# Patient Record
Sex: Male | Born: 1945 | Race: Black or African American | Hispanic: No | Marital: Married | Smoking: Former smoker
Health system: Southern US, Community
[De-identification: ages and names within clinical notes are randomized; demographics above are authoritative.]

## PROBLEM LIST (undated history)

## (undated) DIAGNOSIS — K922 Gastrointestinal hemorrhage, unspecified: Secondary | ICD-10-CM

## (undated) DIAGNOSIS — J449 Chronic obstructive pulmonary disease, unspecified: Secondary | ICD-10-CM

## (undated) DIAGNOSIS — H47019 Ischemic optic neuropathy, unspecified eye: Secondary | ICD-10-CM

## (undated) DIAGNOSIS — N529 Male erectile dysfunction, unspecified: Secondary | ICD-10-CM

## (undated) DIAGNOSIS — H9190 Unspecified hearing loss, unspecified ear: Secondary | ICD-10-CM

## (undated) DIAGNOSIS — I10 Essential (primary) hypertension: Secondary | ICD-10-CM

## (undated) DIAGNOSIS — K5790 Diverticulosis of intestine, part unspecified, without perforation or abscess without bleeding: Secondary | ICD-10-CM

## (undated) DIAGNOSIS — I428 Other cardiomyopathies: Secondary | ICD-10-CM

## (undated) DIAGNOSIS — D126 Benign neoplasm of colon, unspecified: Secondary | ICD-10-CM

## (undated) DIAGNOSIS — N400 Enlarged prostate without lower urinary tract symptoms: Secondary | ICD-10-CM

## (undated) DIAGNOSIS — I82409 Acute embolism and thrombosis of unspecified deep veins of unspecified lower extremity: Secondary | ICD-10-CM

## (undated) DIAGNOSIS — K5792 Diverticulitis of intestine, part unspecified, without perforation or abscess without bleeding: Secondary | ICD-10-CM

## (undated) DIAGNOSIS — G4730 Sleep apnea, unspecified: Secondary | ICD-10-CM

## (undated) DIAGNOSIS — M5136 Other intervertebral disc degeneration, lumbar region: Secondary | ICD-10-CM

## (undated) DIAGNOSIS — I509 Heart failure, unspecified: Secondary | ICD-10-CM

## (undated) DIAGNOSIS — I4891 Unspecified atrial fibrillation: Secondary | ICD-10-CM

## (undated) DIAGNOSIS — E119 Type 2 diabetes mellitus without complications: Secondary | ICD-10-CM

## (undated) DIAGNOSIS — E785 Hyperlipidemia, unspecified: Secondary | ICD-10-CM

## (undated) DIAGNOSIS — M4640 Discitis, unspecified, site unspecified: Secondary | ICD-10-CM

## (undated) DIAGNOSIS — M1990 Unspecified osteoarthritis, unspecified site: Secondary | ICD-10-CM

## (undated) DIAGNOSIS — M51369 Other intervertebral disc degeneration, lumbar region without mention of lumbar back pain or lower extremity pain: Secondary | ICD-10-CM

## (undated) HISTORY — PX: CHOLECYSTECTOMY: SHX55

## (undated) HISTORY — DX: Benign prostatic hyperplasia without lower urinary tract symptoms: N40.0

## (undated) HISTORY — PX: SIGMOIDECTOMY: SHX176

## (undated) HISTORY — DX: Diverticulosis of intestine, part unspecified, without perforation or abscess without bleeding: K57.90

## (undated) HISTORY — PX: COLONOSCOPY: SHX174

## (undated) HISTORY — DX: Essential (primary) hypertension: I10

## (undated) HISTORY — DX: Unspecified atrial fibrillation: I48.91

## (undated) HISTORY — DX: Sleep apnea, unspecified: G47.30

## (undated) HISTORY — PX: TOTAL KNEE ARTHROPLASTY: SHX125

## (undated) HISTORY — PX: ATRIAL ABLATION SURGERY: SHX560

## (undated) HISTORY — DX: Hyperlipidemia, unspecified: E78.5

## (undated) HISTORY — DX: Type 2 diabetes mellitus without complications: E11.9

## (undated) HISTORY — PX: JOINT REPLACEMENT: SHX530

## (undated) HISTORY — DX: Heart failure, unspecified: I50.9

## (undated) SURGERY — Surgical Case
Anesthesia: *Unknown

---

## 2006-03-25 ENCOUNTER — Emergency Department: Payer: Self-pay | Admitting: General Practice

## 2006-03-25 ENCOUNTER — Other Ambulatory Visit: Payer: Self-pay

## 2006-04-04 ENCOUNTER — Ambulatory Visit: Payer: Self-pay

## 2006-04-30 ENCOUNTER — Ambulatory Visit: Payer: Self-pay | Admitting: Pain Medicine

## 2006-05-08 ENCOUNTER — Ambulatory Visit: Payer: Self-pay | Admitting: Pain Medicine

## 2006-06-13 ENCOUNTER — Ambulatory Visit: Payer: Self-pay | Admitting: Pain Medicine

## 2007-01-31 ENCOUNTER — Ambulatory Visit: Payer: Self-pay | Admitting: Gastroenterology

## 2007-01-31 DIAGNOSIS — Z86010 Personal history of colonic polyps: Secondary | ICD-10-CM

## 2007-05-27 ENCOUNTER — Ambulatory Visit: Payer: Self-pay | Admitting: Gastroenterology

## 2008-12-31 HISTORY — PX: ATRIAL ABLATION SURGERY: SHX560

## 2009-02-10 ENCOUNTER — Ambulatory Visit: Payer: Self-pay | Admitting: Gastroenterology

## 2009-12-31 HISTORY — PX: CHOLECYSTECTOMY: SHX55

## 2009-12-31 HISTORY — PX: ERCP: SHX60

## 2010-03-10 ENCOUNTER — Ambulatory Visit: Payer: Self-pay | Admitting: Internal Medicine

## 2010-05-15 ENCOUNTER — Ambulatory Visit: Payer: Self-pay | Admitting: Internal Medicine

## 2010-05-15 ENCOUNTER — Inpatient Hospital Stay: Payer: Self-pay | Admitting: General Surgery

## 2010-06-01 ENCOUNTER — Ambulatory Visit: Payer: Self-pay

## 2010-06-20 ENCOUNTER — Inpatient Hospital Stay: Payer: Self-pay | Admitting: Unknown Physician Specialty

## 2010-06-30 ENCOUNTER — Ambulatory Visit: Payer: Self-pay

## 2010-07-10 ENCOUNTER — Inpatient Hospital Stay: Payer: Self-pay | Admitting: Internal Medicine

## 2010-07-26 ENCOUNTER — Ambulatory Visit: Payer: Self-pay | Admitting: Specialist

## 2010-07-28 ENCOUNTER — Ambulatory Visit: Payer: Self-pay

## 2010-08-31 ENCOUNTER — Ambulatory Visit: Payer: Self-pay

## 2010-09-15 ENCOUNTER — Ambulatory Visit: Payer: Self-pay

## 2010-09-22 ENCOUNTER — Ambulatory Visit: Payer: Self-pay

## 2012-02-20 ENCOUNTER — Other Ambulatory Visit: Payer: Self-pay | Admitting: Pain Medicine

## 2012-02-20 ENCOUNTER — Ambulatory Visit: Payer: Self-pay | Admitting: Pain Medicine

## 2012-02-20 LAB — CBC WITH DIFFERENTIAL/PLATELET
Eosinophil #: 0.1 10*3/uL (ref 0.0–0.7)
HCT: 51 % (ref 40.0–52.0)
HGB: 16.6 g/dL (ref 13.0–18.0)
Lymphocyte #: 2.5 10*3/uL (ref 1.0–3.6)
MCH: 27.9 pg (ref 26.0–34.0)
MCHC: 32.6 g/dL (ref 32.0–36.0)
MCV: 86 fL (ref 80–100)
Monocyte #: 0.9 10*3/uL — ABNORMAL HIGH (ref 0.0–0.7)
Monocyte %: 11.4 %
Neutrophil #: 4.3 10*3/uL (ref 1.4–6.5)
RDW: 14.5 % (ref 11.5–14.5)
WBC: 7.8 10*3/uL (ref 3.8–10.6)

## 2012-02-20 LAB — SEDIMENTATION RATE: Erythrocyte Sed Rate: 1 mm/hr (ref 0–20)

## 2012-03-03 ENCOUNTER — Ambulatory Visit: Payer: Self-pay | Admitting: Pain Medicine

## 2012-03-03 LAB — CREATININE, SERUM: EGFR (Non-African Amer.): >60

## 2012-03-05 ENCOUNTER — Ambulatory Visit: Payer: Self-pay | Admitting: Pain Medicine

## 2012-03-17 ENCOUNTER — Ambulatory Visit: Payer: Self-pay | Admitting: Pain Medicine

## 2012-03-19 ENCOUNTER — Ambulatory Visit: Payer: Self-pay | Admitting: Internal Medicine

## 2012-03-24 ENCOUNTER — Ambulatory Visit: Payer: Self-pay | Admitting: Pain Medicine

## 2012-03-31 ENCOUNTER — Ambulatory Visit: Payer: Self-pay | Admitting: Internal Medicine

## 2012-05-12 ENCOUNTER — Ambulatory Visit: Payer: Self-pay | Admitting: Pain Medicine

## 2012-05-28 ENCOUNTER — Ambulatory Visit: Payer: Self-pay

## 2012-06-04 ENCOUNTER — Ambulatory Visit: Payer: Self-pay | Admitting: Internal Medicine

## 2012-06-30 ENCOUNTER — Ambulatory Visit: Payer: Self-pay | Admitting: Internal Medicine

## 2012-08-19 LAB — CBC WITH DIFFERENTIAL/PLATELET
Basophil #: 0.1 10*3/uL (ref 0.0–0.1)
Basophil %: 1.1 %
Eosinophil #: 0.1 10*3/uL (ref 0.0–0.7)
Eosinophil %: 1.1 %
HGB: 13.4 g/dL (ref 13.0–18.0)
Lymphocyte %: 27.8 %
MCH: 27.4 pg (ref 26.0–34.0)
MCHC: 32.4 g/dL (ref 32.0–36.0)
MCV: 85 fL (ref 80–100)
Monocyte #: 0.8 x10 3/mm (ref 0.2–1.0)
Neutrophil #: 6 10*3/uL (ref 1.4–6.5)
Platelet: 254 10*3/uL (ref 150–440)
RDW: 14.4 % (ref 11.5–14.5)
WBC: 9.7 10*3/uL (ref 3.8–10.6)

## 2012-08-19 LAB — URINALYSIS, COMPLETE
Bacteria: NONE SEEN
Bilirubin,UR: NEGATIVE
Blood: NEGATIVE
Glucose,UR: NEGATIVE mg/dL (ref 0–75)
Leukocyte Esterase: NEGATIVE
Ph: 5 (ref 4.5–8.0)
RBC,UR: 1 /HPF (ref 0–5)
Squamous Epithelial: <1

## 2012-08-19 LAB — COMPREHENSIVE METABOLIC PANEL
Alkaline Phosphatase: 74 U/L (ref 50–136)
Bilirubin,Total: 0.8 mg/dL (ref 0.2–1.0)
Calcium, Total: 9.2 mg/dL (ref 8.5–10.1)
Co2: 30 mmol/L (ref 21–32)
Creatinine: 1.06 mg/dL (ref 0.60–1.30)
EGFR (African American): >60
EGFR (Non-African Amer.): >60
Glucose: 188 mg/dL — ABNORMAL HIGH (ref 65–99)
SGOT(AST): 21 U/L (ref 15–37)
SGPT (ALT): 22 U/L (ref 12–78)
Sodium: 138 mmol/L (ref 136–145)

## 2012-08-19 LAB — PROTIME-INR: INR: 1

## 2012-08-20 ENCOUNTER — Inpatient Hospital Stay: Payer: Self-pay | Admitting: Internal Medicine

## 2012-08-20 LAB — TROPONIN I
Troponin-I: <0.02 ng/mL
Troponin-I: <0.02 ng/mL

## 2012-08-20 LAB — HEMOGLOBIN
HGB: 11.6 g/dL — ABNORMAL LOW (ref 13.0–18.0)
HGB: 10.1 g/dL — ABNORMAL LOW (ref 13.0–18.0)

## 2012-08-20 LAB — TSH: Thyroid Stimulating Horm: 0.561 u[IU]/mL

## 2012-08-21 LAB — CBC WITH DIFFERENTIAL/PLATELET
Basophil #: 0.1 10*3/uL (ref 0.0–0.1)
Basophil #: 0.1 10*3/uL (ref 0.0–0.1)
Basophil #: 0.1 10*3/uL (ref 0.0–0.1)
Basophil #: 0.1 10*3/uL (ref 0.0–0.1)
Eosinophil #: 0.2 10*3/uL (ref 0.0–0.7)
Eosinophil #: 0 10*3/uL (ref 0.0–0.7)
Eosinophil #: 0.1 10*3/uL (ref 0.0–0.7)
Eosinophil %: 2 %
Eosinophil %: 0.1 %
Eosinophil %: 0.1 %
Eosinophil %: 0.5 %
HCT: 23.2 % — ABNORMAL LOW (ref 40.0–52.0)
HGB: 7.4 g/dL — ABNORMAL LOW (ref 13.0–18.0)
HGB: 7.7 g/dL — ABNORMAL LOW (ref 13.0–18.0)
Lymphocyte #: 2.2 10*3/uL (ref 1.0–3.6)
Lymphocyte #: 2.5 10*3/uL (ref 1.0–3.6)
Lymphocyte %: 30.3 %
Lymphocyte %: 21.1 %
Lymphocyte %: 16.1 %
Lymphocyte %: 19.1 %
MCH: 27.7 pg (ref 26.0–34.0)
MCH: 28.8 pg (ref 26.0–34.0)
MCH: 28.3 pg (ref 26.0–34.0)
MCHC: 33.4 g/dL (ref 32.0–36.0)
MCHC: 32.7 g/dL (ref 32.0–36.0)
MCHC: 33.4 g/dL (ref 32.0–36.0)
MCV: 85 fL (ref 80–100)
MCV: 85 fL (ref 80–100)
Monocyte #: 1.1 x10 3/mm — ABNORMAL HIGH (ref 0.2–1.0)
Monocyte #: 1.3 x10 3/mm — ABNORMAL HIGH (ref 0.2–1.0)
Monocyte %: 8.8 %
Neutrophil #: 9.1 10*3/uL — ABNORMAL HIGH (ref 1.4–6.5)
Neutrophil %: 55.8 %
Neutrophil %: 68.4 %
Neutrophil %: 74.6 %
Platelet: 184 10*3/uL (ref 150–440)
Platelet: 181 10*3/uL (ref 150–440)
RBC: 2.93 10*6/uL — ABNORMAL LOW (ref 4.40–5.90)
RBC: 2.74 10*6/uL — ABNORMAL LOW (ref 4.40–5.90)
RDW: 14.4 % (ref 11.5–14.5)
RDW: 14.2 % (ref 11.5–14.5)
RDW: 14.2 % (ref 11.5–14.5)
WBC: 8.9 10*3/uL (ref 3.8–10.6)
WBC: 11.2 10*3/uL — ABNORMAL HIGH (ref 3.8–10.6)
WBC: 13.4 10*3/uL — ABNORMAL HIGH (ref 3.8–10.6)
WBC: 13.1 10*3/uL — ABNORMAL HIGH (ref 3.8–10.6)

## 2012-08-21 LAB — HEMOGLOBIN A1C: Hemoglobin A1C: 6.7 % — ABNORMAL HIGH (ref 4.2–6.3)

## 2012-08-21 LAB — LIPID PANEL
Ldl Cholesterol, Calc: 44 mg/dL (ref 0–100)
Triglycerides: 74 mg/dL (ref 0–200)

## 2012-08-21 LAB — BASIC METABOLIC PANEL
Calcium, Total: 8.2 mg/dL — ABNORMAL LOW (ref 8.5–10.1)
Chloride: 108 mmol/L — ABNORMAL HIGH (ref 98–107)
Co2: 28 mmol/L (ref 21–32)
EGFR (African American): >60
EGFR (Non-African Amer.): >60
Glucose: 142 mg/dL — ABNORMAL HIGH (ref 65–99)
Osmolality: 285 (ref 275–301)
Sodium: 142 mmol/L (ref 136–145)

## 2012-08-21 LAB — PROTIME-INR
INR: 1.1
Prothrombin Time: 14.4 secs (ref 11.5–14.7)

## 2012-08-22 LAB — BASIC METABOLIC PANEL
Anion Gap: 7 (ref 7–16)
Chloride: 108 mmol/L — ABNORMAL HIGH (ref 98–107)
Co2: 27 mmol/L (ref 21–32)
Osmolality: 283 (ref 275–301)
Potassium: 4 mmol/L (ref 3.5–5.1)

## 2012-08-22 LAB — CBC WITH DIFFERENTIAL/PLATELET
Basophil #: 0.1 10*3/uL (ref 0.0–0.1)
Basophil %: 0.5 %
Eosinophil #: 0.2 10*3/uL (ref 0.0–0.7)
Eosinophil %: 1.4 %
HCT: 24.9 % — ABNORMAL LOW (ref 40.0–52.0)
HGB: 8.1 g/dL — ABNORMAL LOW (ref 13.0–18.0)
Lymphocyte #: 2.6 10*3/uL (ref 1.0–3.6)
Lymphocyte %: 19.1 %
MCH: 28 pg (ref 26.0–34.0)
MCHC: 32.7 g/dL (ref 32.0–36.0)
MCV: 86 fL (ref 80–100)
Monocyte #: 1.2 x10 3/mm — ABNORMAL HIGH (ref 0.2–1.0)
Neutrophil #: 9.7 10*3/uL — ABNORMAL HIGH (ref 1.4–6.5)

## 2012-08-22 LAB — HEMOGLOBIN: HGB: 7.3 g/dL — ABNORMAL LOW (ref 13.0–18.0)

## 2012-08-23 LAB — HEMOGLOBIN: HGB: 8.1 g/dL — ABNORMAL LOW (ref 13.0–18.0)

## 2012-08-24 LAB — CBC WITH DIFFERENTIAL/PLATELET
Basophil %: 0.5 %
Eosinophil #: 0.3 10*3/uL (ref 0.0–0.7)
Eosinophil %: 2.5 %
HCT: 22.1 % — ABNORMAL LOW (ref 40.0–52.0)
HGB: 7.6 g/dL — ABNORMAL LOW (ref 13.0–18.0)
Lymphocyte #: 2.4 10*3/uL (ref 1.0–3.6)
MCH: 29.7 pg (ref 26.0–34.0)
MCHC: 34.3 g/dL (ref 32.0–36.0)
MCV: 87 fL (ref 80–100)
Monocyte #: 1.2 x10 3/mm — ABNORMAL HIGH (ref 0.2–1.0)
Monocyte %: 9.8 %
Neutrophil #: 8.1 10*3/uL — ABNORMAL HIGH (ref 1.4–6.5)
Neutrophil %: 67 %
RBC: 2.54 10*6/uL — ABNORMAL LOW (ref 4.40–5.90)

## 2012-08-24 LAB — BASIC METABOLIC PANEL
Calcium, Total: 7.8 mg/dL — ABNORMAL LOW (ref 8.5–10.1)
Chloride: 109 mmol/L — ABNORMAL HIGH (ref 98–107)
EGFR (African American): >60
EGFR (Non-African Amer.): >60
Osmolality: 287 (ref 275–301)
Potassium: 3.7 mmol/L (ref 3.5–5.1)
Sodium: 145 mmol/L (ref 136–145)

## 2012-08-24 LAB — HEMOGLOBIN
HGB: 8 g/dL — ABNORMAL LOW (ref 13.0–18.0)
HGB: 7.8 g/dL — ABNORMAL LOW (ref 13.0–18.0)
HGB: 7.3 g/dL — ABNORMAL LOW (ref 13.0–18.0)

## 2012-08-26 LAB — HEMOGLOBIN
HGB: 7.7 g/dL — ABNORMAL LOW (ref 13.0–18.0)
HGB: 8.3 g/dL — ABNORMAL LOW (ref 13.0–18.0)

## 2012-08-27 LAB — CBC WITH DIFFERENTIAL/PLATELET
Basophil #: 0.1 10*3/uL (ref 0.0–0.1)
Basophil %: 0.6 %
Eosinophil #: 0.3 10*3/uL (ref 0.0–0.7)
HCT: 23.9 % — ABNORMAL LOW (ref 40.0–52.0)
Lymphocyte #: 2.2 10*3/uL (ref 1.0–3.6)
MCHC: 33.3 g/dL (ref 32.0–36.0)
MCV: 88 fL (ref 80–100)
Monocyte #: 1.1 x10 3/mm — ABNORMAL HIGH (ref 0.2–1.0)
Monocyte %: 9.4 %
Neutrophil #: 8.3 10*3/uL — ABNORMAL HIGH (ref 1.4–6.5)
Platelet: 324 10*3/uL (ref 150–440)
RDW: 14.8 % — ABNORMAL HIGH (ref 11.5–14.5)
WBC: 12 10*3/uL — ABNORMAL HIGH (ref 3.8–10.6)

## 2012-08-28 LAB — HEMOGLOBIN: HGB: 7.7 g/dL — ABNORMAL LOW (ref 13.0–18.0)

## 2012-12-31 HISTORY — PX: ESOPHAGOGASTRODUODENOSCOPY: SHX1529

## 2013-03-23 ENCOUNTER — Ambulatory Visit: Payer: Self-pay | Admitting: Internal Medicine

## 2013-03-31 ENCOUNTER — Ambulatory Visit: Payer: Self-pay | Admitting: Internal Medicine

## 2013-03-31 LAB — CBC CANCER CENTER
Bands: 3 %
Basophil %: 1.4 %
Eosinophil #: 0.2 x10 3/mm (ref 0.0–0.7)
Eosinophil %: 2.2 %
HCT: 44.5 % (ref 40.0–52.0)
Lymphocyte #: 2.4 x10 3/mm (ref 1.0–3.6)
Lymphocyte %: 31.3 %
Lymphocytes: 36 %
Monocyte #: 0.9 x10 3/mm (ref 0.2–1.0)
Monocyte %: 11.5 %
Neutrophil #: 4.2 x10 3/mm (ref 1.4–6.5)
Neutrophil %: 53.6 %
RBC: 6.41 10*6/uL — ABNORMAL HIGH (ref 4.40–5.90)
RDW: 19.8 % — ABNORMAL HIGH (ref 11.5–14.5)
Variant Lymphocyte: 2 %
WBC: 7.8 x10 3/mm (ref 3.8–10.6)

## 2013-03-31 LAB — IRON AND TIBC
Iron Saturation: 7 %
Unbound Iron-Bind.Cap.: 530 ug/dL

## 2013-04-01 LAB — PROT IMMUNOELECTROPHORES(ARMC)

## 2013-04-30 ENCOUNTER — Ambulatory Visit: Payer: Self-pay | Admitting: Internal Medicine

## 2013-05-31 ENCOUNTER — Ambulatory Visit: Payer: Self-pay | Admitting: Internal Medicine

## 2013-06-26 LAB — CBC CANCER CENTER
Basophil %: 1 %
Eosinophil #: 0.1 x10 3/mm (ref 0.0–0.7)
Eosinophil %: 1.2 %
HGB: 15.6 g/dL (ref 13.0–18.0)
Lymphocyte #: 3.7 x10 3/mm — ABNORMAL HIGH (ref 1.0–3.6)
Lymphocyte %: 35.7 %
MCH: 25.2 pg — ABNORMAL LOW (ref 26.0–34.0)
MCHC: 32 g/dL (ref 32.0–36.0)
MCV: 79 fL — ABNORMAL LOW (ref 80–100)
Monocyte %: 9.6 %
Neutrophil #: 5.5 x10 3/mm (ref 1.4–6.5)
Neutrophil %: 52.5 %
RBC: 6.19 10*6/uL — ABNORMAL HIGH (ref 4.40–5.90)
RDW: 21.1 % — ABNORMAL HIGH (ref 11.5–14.5)
WBC: 10.5 x10 3/mm (ref 3.8–10.6)

## 2013-06-26 LAB — IRON AND TIBC
Iron Saturation: 21 %
Iron: 88 ug/dL (ref 65–175)
Unbound Iron-Bind.Cap.: 339 ug/dL

## 2013-06-30 ENCOUNTER — Ambulatory Visit: Payer: Self-pay | Admitting: Internal Medicine

## 2013-10-03 ENCOUNTER — Inpatient Hospital Stay: Payer: Self-pay | Admitting: Internal Medicine

## 2013-10-03 LAB — CBC
HCT: 48.7 % (ref 40.0–52.0)
Platelet: 274 10*3/uL (ref 150–440)

## 2013-10-03 LAB — URINALYSIS, COMPLETE
Bacteria: NONE SEEN
Bilirubin,UR: NEGATIVE
Blood: NEGATIVE
Ph: 5 (ref 4.5–8.0)
Specific Gravity: 1.027 (ref 1.003–1.030)
WBC UR: 14 /HPF (ref 0–5)

## 2013-10-03 LAB — COMPREHENSIVE METABOLIC PANEL
Albumin: 3.5 g/dL (ref 3.4–5.0)
Alkaline Phosphatase: 91 U/L (ref 50–136)
Co2: 26 mmol/L (ref 21–32)
Creatinine: 0.87 mg/dL (ref 0.60–1.30)
Glucose: 157 mg/dL — ABNORMAL HIGH (ref 65–99)
Potassium: 3.8 mmol/L (ref 3.5–5.1)
SGOT(AST): 17 U/L (ref 15–37)
SGPT (ALT): 22 U/L (ref 12–78)
Total Protein: 7.1 g/dL (ref 6.4–8.2)

## 2013-10-03 LAB — HEMOGLOBIN: HGB: 13.3 g/dL (ref 13.0–18.0)

## 2013-10-03 LAB — PROTIME-INR
INR: 1
Prothrombin Time: 13.4 secs (ref 11.5–14.7)

## 2013-10-04 LAB — COMPREHENSIVE METABOLIC PANEL
Albumin: 3 g/dL — ABNORMAL LOW (ref 3.4–5.0)
Alkaline Phosphatase: 92 U/L (ref 50–136)
Anion Gap: 7 (ref 7–16)
BUN: 8 mg/dL (ref 7–18)
Co2: 27 mmol/L (ref 21–32)
EGFR (Non-African Amer.): >60
Glucose: 136 mg/dL — ABNORMAL HIGH (ref 65–99)
Osmolality: 278 (ref 275–301)
Potassium: 3.7 mmol/L (ref 3.5–5.1)
SGOT(AST): 8 U/L — ABNORMAL LOW (ref 15–37)
SGPT (ALT): 17 U/L (ref 12–78)
Sodium: 139 mmol/L (ref 136–145)

## 2013-10-04 LAB — CBC WITH DIFFERENTIAL/PLATELET
Basophil #: 0 10*3/uL (ref 0.0–0.1)
Basophil %: 0.6 %
Eosinophil #: 0.1 10*3/uL (ref 0.0–0.7)
MCH: 27.8 pg (ref 26.0–34.0)
MCHC: 33.5 g/dL (ref 32.0–36.0)
MCV: 83 fL (ref 80–100)
Monocyte #: 0.7 x10 3/mm (ref 0.2–1.0)
Monocyte %: 9.6 %
Neutrophil #: 4.9 10*3/uL (ref 1.4–6.5)

## 2013-10-04 LAB — HEMOGLOBIN: HGB: 15.1 g/dL (ref 13.0–18.0)

## 2014-01-26 ENCOUNTER — Ambulatory Visit: Payer: Self-pay | Admitting: Specialist

## 2014-03-10 ENCOUNTER — Ambulatory Visit: Payer: Self-pay | Admitting: General Practice

## 2014-03-10 LAB — CBC
HCT: 48.1 % (ref 40.0–52.0)
HGB: 15.7 g/dL (ref 13.0–18.0)
MCH: 26.5 pg (ref 26.0–34.0)
MCHC: 32.6 g/dL (ref 32.0–36.0)
MCV: 81 fL (ref 80–100)
Platelet: 268 10*3/uL (ref 150–440)
RBC: 5.93 10*6/uL — ABNORMAL HIGH (ref 4.40–5.90)
RDW: 15.8 % — ABNORMAL HIGH (ref 11.5–14.5)
WBC: 7.8 10*3/uL (ref 3.8–10.6)

## 2014-03-10 LAB — URINALYSIS, COMPLETE
BILIRUBIN, UR: NEGATIVE
Bacteria: NONE SEEN
Blood: NEGATIVE
Glucose,UR: NEGATIVE mg/dL (ref 0–75)
Ketone: NEGATIVE
Nitrite: NEGATIVE
PROTEIN: NEGATIVE
Ph: 6 (ref 4.5–8.0)
RBC,UR: <1 /HPF (ref 0–5)
Specific Gravity: 1.008 (ref 1.003–1.030)
Squamous Epithelial: NONE SEEN
WBC UR: 5 /HPF (ref 0–5)

## 2014-03-10 LAB — SEDIMENTATION RATE: Erythrocyte Sed Rate: 1 mm/hr (ref 0–20)

## 2014-03-10 LAB — PROTIME-INR
INR: 1.1
PROTHROMBIN TIME: 14 s (ref 11.5–14.7)

## 2014-03-10 LAB — APTT: Activated PTT: 32.2 secs (ref 23.6–35.9)

## 2014-03-10 LAB — BASIC METABOLIC PANEL
Anion Gap: 1 — ABNORMAL LOW (ref 7–16)
BUN: 9 mg/dL (ref 7–18)
CREATININE: 0.91 mg/dL (ref 0.60–1.30)
Calcium, Total: 9.8 mg/dL (ref 8.5–10.1)
Chloride: 99 mmol/L (ref 98–107)
Co2: 32 mmol/L (ref 21–32)
GLUCOSE: 148 mg/dL — AB (ref 65–99)
OSMOLALITY: 266 (ref 275–301)
POTASSIUM: 4 mmol/L (ref 3.5–5.1)
Sodium: 132 mmol/L — ABNORMAL LOW (ref 136–145)

## 2014-03-10 LAB — MRSA PCR SCREENING

## 2014-03-11 LAB — URINE CULTURE

## 2014-03-24 ENCOUNTER — Inpatient Hospital Stay: Payer: Self-pay | Admitting: General Practice

## 2014-03-24 HISTORY — PX: TOTAL KNEE ARTHROPLASTY: SHX125

## 2014-03-25 LAB — BASIC METABOLIC PANEL
Anion Gap: 2 — ABNORMAL LOW (ref 7–16)
BUN: 8 mg/dL (ref 7–18)
CALCIUM: 8.8 mg/dL (ref 8.5–10.1)
CO2: 31 mmol/L (ref 21–32)
Chloride: 104 mmol/L (ref 98–107)
Creatinine: 0.83 mg/dL (ref 0.60–1.30)
GLUCOSE: 109 mg/dL — AB (ref 65–99)
OSMOLALITY: 273 (ref 275–301)
Potassium: 3.7 mmol/L (ref 3.5–5.1)
SODIUM: 137 mmol/L (ref 136–145)

## 2014-03-25 LAB — PLATELET COUNT: Platelet: 221 10*3/uL (ref 150–440)

## 2014-03-25 LAB — HEMOGLOBIN: HGB: 14.2 g/dL (ref 13.0–18.0)

## 2014-03-26 LAB — BASIC METABOLIC PANEL
Anion Gap: 0 — ABNORMAL LOW (ref 7–16)
BUN: 8 mg/dL (ref 7–18)
CHLORIDE: 102 mmol/L (ref 98–107)
Calcium, Total: 9.2 mg/dL (ref 8.5–10.1)
Co2: 34 mmol/L — ABNORMAL HIGH (ref 21–32)
Creatinine: 0.9 mg/dL (ref 0.60–1.30)
EGFR (African American): >60
EGFR (Non-African Amer.): >60
Glucose: 114 mg/dL — ABNORMAL HIGH (ref 65–99)
Osmolality: 271 (ref 275–301)
Potassium: 4 mmol/L (ref 3.5–5.1)
Sodium: 136 mmol/L (ref 136–145)

## 2014-03-26 LAB — HEMOGLOBIN: HGB: 14.4 g/dL (ref 13.0–18.0)

## 2014-03-26 LAB — PLATELET COUNT: PLATELETS: 212 10*3/uL (ref 150–440)

## 2014-05-06 DIAGNOSIS — Z96651 Presence of right artificial knee joint: Secondary | ICD-10-CM | POA: Insufficient documentation

## 2014-05-06 DIAGNOSIS — M1712 Unilateral primary osteoarthritis, left knee: Secondary | ICD-10-CM | POA: Insufficient documentation

## 2014-06-22 DIAGNOSIS — M1710 Unilateral primary osteoarthritis, unspecified knee: Secondary | ICD-10-CM | POA: Insufficient documentation

## 2014-11-07 DIAGNOSIS — E7800 Pure hypercholesterolemia, unspecified: Secondary | ICD-10-CM | POA: Insufficient documentation

## 2014-11-07 DIAGNOSIS — E1129 Type 2 diabetes mellitus with other diabetic kidney complication: Secondary | ICD-10-CM | POA: Insufficient documentation

## 2014-11-07 DIAGNOSIS — R809 Proteinuria, unspecified: Secondary | ICD-10-CM

## 2014-11-07 DIAGNOSIS — H9190 Unspecified hearing loss, unspecified ear: Secondary | ICD-10-CM | POA: Insufficient documentation

## 2014-11-07 DIAGNOSIS — M5136 Other intervertebral disc degeneration, lumbar region: Secondary | ICD-10-CM | POA: Insufficient documentation

## 2014-11-07 DIAGNOSIS — E1121 Type 2 diabetes mellitus with diabetic nephropathy: Secondary | ICD-10-CM | POA: Insufficient documentation

## 2014-11-07 DIAGNOSIS — I10 Essential (primary) hypertension: Secondary | ICD-10-CM | POA: Insufficient documentation

## 2015-04-19 NOTE — Consult Note (Signed)
PATIENT NAME:  Evan [name], Evan [name] MR#:  [id number] DATE OF BIRTH:  July 04, 1945  DATE OF CONSULTATION:  08/26/2012  REFERRING PHYSICIAN:   CONSULTING PHYSICIAN:  Loreli Dollar, MD  HISTORY OF PRESENT ILLNESS: This 70 year old male was admitted with a chief complaint of rectal bleeding. He has been in the hospital for a week. He describes fairly severe bleeding. He had no associated abdominal pain. He had no nausea or vomiting. No chills or fever. Did have some significant weakness and dizziness.   He was admitted emergently. Patient says he thinks he has had five blood transfusions. He did have a GI bleeding study which demonstrated bleeding in the right lower quadrant. Also has had mesenteric catheterization and embolization of the ileocolic vessels. Also has had colonoscopy with findings of extensive diverticulosis of the right colon. Patient reports he subsequently went a number of days without a bowel movement. His hemoglobin is being monitored and appeared that he was stable, however, today he had formed bowel movement and there was some blood identified which some of it was somewhat bright red and most of it was more of a dark maroon color. Patient reports no new symptoms today other than the bowel movement. He reports he is voiding satisfactorily. He is not having any difficulty breathing. He has been up walking, not having any fever or chills.   PAST MEDICAL HISTORY: His past medical history has generally been good.  1. History of hypertension.  2. Hyperlipidemia.  3. History of atrial flutter and has had ablation 2010. 4. Diet controlled diabetes  5. History of benign prostatic hypertrophy.  PAST SURGICAL HISTORY: I did a laparoscopic cholecystectomy in 2011.   SOCIAL HISTORY: He is accompanied by his wife. Has not smoked in some 18 years. Does not drink any alcohol except for just a very rare time.    FAMILY HISTORY: Positive for diabetes, heart disease.   MEDICATIONS:  1. Aspirin 81  mg. 2. Tamsulosin 0.4 mg daily.  3. Pravastatin 40 mg daily.  4. Amlodipine/benazepril 10/20 daily.  5. Fluticasone nasal spray daily. 6. Eyedrops for glaucoma.  7. Vitamins.  REVIEW OF SYSTEMS: He reports that before this he had been moving his bowels satisfactorily with no problems with constipation or previous bleeding or diarrhea. He reports he eats oatmeal frequently. Does have some fiber in his diet. He reports no recent acute illness such as cough, cold, sore throat. No recent visual or auditory changes. No dyspnea on exertion. No chest pains. No recent rash or boil. Has been voiding satisfactorily. Review of systems otherwise negative.   PHYSICAL EXAMINATION:  GENERAL: He is awake, alert, oriented, ambulatory, no acute distress.   VITAL SIGNS: Blood pressure 151/83, pulse 87, respirations 20, temperature 98.1, oxygen saturation 97%.   SKIN: Warm and dry without rash.   HEENT: Sclerae clear. Pharynx clear.   NECK: No palpable mass.   LUNGS: Lungs sounds are clear.   HEART: Regular rhythm, S1 and S2.   ABDOMEN: Fairly obese and soft, nontender with no palpable mass. No hepatomegaly.   RECTAL: Rectal exam demonstrated tiny bit of blood stain on the anoderm. Digital exam demonstrates good sphincter tone. There was some very dark maroon-colored stool found on the examining finger but there was no palpable anorectal mass.   EXTREMITIES: No dependent edema.   NEUROLOGIC: Awake, alert, oriented, moving all extremities.   LABORATORY, DIAGNOSTIC AND RADIOLOGICAL DATA: Creatinine is normal at 0.71. Latest hemoglobin 8.3. Latest platelet count 188,000. Pro time 14.4 seconds.  IMPRESSION AND RECOMMENDATIONS: Diverticulosis of the right colon with recent colonic bleeding and posthemorrhagic anemia.   I discussed this with him in detail. I do not think he needs any immediate surgery but I think we can still give some time for observation. I have explained that once all the bleeding  stops it may take about a week or more for all of the blood to exit the colon.  I have discussed the potential role for surgery which could be laparoscopic right colectomy although surgery not recommended at present.   I did recommend avoid aspirin for a period of several months and at least until his anemia has resolved and could consider avoiding aspirin for a longer period of time.   If he has no further substantial bleeding possibly could go home from the hospital within the next couple days but I would suggest seeing the next couple of bowel movements and determine if he is satisfactory for discharge.  ____________________________ J. Rochel Brome, MD jws:cms D: 08/26/2012 18:43:19 ET T: 08/27/2012 10:02:47 ET JOB#: [id number]  cc: Loreli Dollar, MD, <Dictator>  Loreli Dollar MD ELECTRONICALLY SIGNED 08/27/2012 18:30

## 2015-04-19 NOTE — Op Note (Signed)
PATIENT NAME:  Evan [name], CREDIT MR#:  [id number] DATE OF BIRTH:  01/13/1945  DATE OF PROCEDURE:  08/23/2012  INDICATION: Persistent rectal bleeding.  POSTPROCEDURE DIAGNOSES: 1. Significant diverticulosis involving mainly the cecum and ascending colon.  2. No active bleeding.  3. No colon polyps.   MEDICATIONS: 6mg  Versed IV 120ug Fentanyl IV  DESCRIPTION: Patient is a 70 year old black gentleman who presents with acute lower GI bleeding. Bleeding scan showed active bleeding in this cecal area. Patient underwent embolization on 08/22. Unfortunately, patient kept on bleeding yesterday with drop in hemoglobin requiring blood transfusions. Therefore, patient was prepped last night to have a colonoscopy this morning.   Fortunately by the time the prep was finishing up the bleeding stopped by this morning. Hemoglobin did drop to 6.6. Patient is to receive another unit of blood. Patient was given Versed and fentanyl for sedation. Patient received a total of 6 mg of Versed and 125 mcg of fentanyl for sedation. The colonoscope was inserted into the rectum under direct visualization. There was no blood anywhere. It was gradually advanced to the cecum without difficulty. Patient had multiple diverticula involving the cecum and ascending colon. There are no adherent clots anywhere. Therefore, it was impossible to tell which of the diverticula bled. Fortunately there is no bleeding. I made multiple times to intubate the terminal ileum without success. The scope was gradually removed as I visualized the mucosa. Patient also has a few small diverticula in the sigmoid colon. Otherwise the rest of the colon was completely normal. There were no polyps anywhere. Patient really did not have much hemorrhoids either. The scope was removed. Patient tolerated the procedure well.   IMPRESSION: Patient with diverticulosis mainly involving the cecum and ascending colon. I suspect it was one of those diverticulum that bled  actively. At this time there is no active bleeding.   PLAN: To monitor the hemoglobin and transfuse as needed. Will gradually advance the diet from full liquids to solid good later. If there is no further bleeding, then patient can be discharged to home in the next day or two. If patient has recurrent bleeding requiring blood transfusions again then will need to contact general surgery for surgical evaluation to potentially remove the right part of the colon.   ____________________________ Lupita Dawn. Candace Cruise, MD pyo:cms D: 08/23/2012 08:49:25 ET T: 08/23/2012 09:31:29 ET JOB#: [id number]  cc: Lupita Dawn. Candace Cruise, MD, <Dictator>  Lupita Dawn Pheobe Sandiford MD ELECTRONICALLY SIGNED 08/24/2012 7:45

## 2015-04-19 NOTE — Discharge Summary (Signed)
PATIENT NAME:  Evan [name], Evan [name] MR#:  [id number] DATE OF BIRTH:  10-Jan-1945  DATE OF ADMISSION:  08/20/2012 DATE OF DISCHARGE:  08/28/2012  This is a final discharge summary for this patient. Please refer to interim discharge summary dictated by Dr. Leslye Peer on 08/27/2012. This is just an addendum to that interim summary as patient is being discharged on 08/28/2012.   HOSPITAL COURSE: Patient's hemoglobin remained stable, was 7.7. Did not have any further bleeding and was evaluated by Dr. Rochel Brome from surgery and Dr. Candace Cruise from GI on the 08/28 with clear recommendation for possible discharge today as he remained hemodynamically stable.   PHYSICAL EXAMINATION: VITAL SIGNS: On the date of discharge his vital signs are as follows: Temperature 98, heart rate 93 per minute, respirations 18 per minute, blood pressure 154/85 mmHg. He is saturating 97% on room air. Pertinent Physical Examination on the date of discharge: CARDIOVASCULAR: S1, S2 normal. No murmurs, rubs, or gallops. LUNGS: Clear to auscultation bilaterally. No wheezing, rales, rhonchi, crepitation. ABDOMEN: Soft, benign. NEUROLOGIC: Nonfocal examination. All other physical examination remained at the baseline.   DISCHARGE MEDICATIONS: As dictated by Dr. Leslye Peer in the interim discharge summary.   DISCHARGE INSTRUCTIONS AND FOLLOW UP: Patient will need follow up with his primary care physician, Dr. Raechel Ache, in one week. He will also need follow up with Dr. Candace Cruise in 2 to 4 weeks and Dr. Rochel Brome in 4 to 6 weeks. He was requested to get CBC checked on 09/03 with results forwarded to primary care physician for hemoglobin monitoring. He was also instructed to call his primary care physician and/or GI and/or surgeon if he notices any bright red blood in excess of normal amount and not stopping or if he gets symptomatic in the form of dizziness and/or unusual fatigue.          TOTAL TIME DISCHARGING THIS PATIENT: 45 minutes.    ____________________________ Lucina Mellow. Manuella Ghazi, MD vss:cms D: 08/28/2012 17:03:06 ET T: 08/29/2012 12:00:57 ET JOB#: [id number]  cc: Aydrian Halpin S. Manuella Ghazi, MD, <Dictator> Christena Flake. Raechel Ache, MD Lupita Dawn. Candace Cruise, MD J. Rochel Brome, MD Katha Cabal, MD  Lucina Mellow [facility] MD ELECTRONICALLY SIGNED 08/29/2012 22:04

## 2015-04-19 NOTE — Consult Note (Signed)
Pt seen and examined. See C.London's notes. Pt had brown stool this AM. Some drop in hgb overnight. No abd pain. Just passed some blood per rectum. Known hx of tics and polyps. Likely has diverticular bleeding. Moniter hgb. If more bleeding, then order bleeding scan. Keep on liquid diet. Do not want to prep him tonight to make bleeding worse. If bleeding slows down, then prep tomorrow afternoon for colonoscopy Fri AM. Will follow. Thanks.  Electronic Signatures: Verdie Shire (MD)  (Signed on 21-Aug-13 16:37)  Authored  Last Updated: 21-Aug-13 16:37 by Verdie Shire (MD)

## 2015-04-19 NOTE — Consult Note (Signed)
Chief Complaint:   Subjective/Chief Complaint Had embolization yest afternoon, but patient still bleeding. Large amount this AM. Repeat hgb show drop in hgb. No abd pain   VITAL SIGNS/ANCILLARY NOTES: **Vital Signs.:   23-Aug-13 12:00   Pulse Pulse 108   Respirations Respirations 24   Systolic BP Systolic BP 696   Diastolic BP (mmHg) Diastolic BP (mmHg) 55   Mean BP 76   Pulse Ox % Pulse Ox % 98   Pulse Ox Activity Level  At rest   Oxygen Delivery Room Air/ 21 %   Pulse Ox Heart Rate 54   Brief Assessment:   Cardiac Regular    Respiratory clear BS    Gastrointestinal Normal   Lab Results: Routine Chem:  23-Aug-13 06:30    Glucose, Serum  129   BUN 8   Creatinine (comp) 0.68   Sodium, Serum 142   Potassium, Serum 4.0   Chloride, Serum  108   CO2, Serum 27   Calcium (Total), Serum  7.7   Anion Gap 7   Osmolality (calc) 283   eGFR (African American) >60   eGFR (Non-African American) >60 (eGFR values <20m/min/1.73 m2 may be an indication of chronic kidney disease (CKD). Calculated eGFR is useful in patients with stable renal function. The eGFR calculation will not be reliable in acutely ill patients when serum creatinine is changing rapidly. It is not useful in  patients on dialysis. The eGFR calculation may not be applicable to patients at the low and high extremes of body sizes, pregnant women, and vegetarians.)  Routine Hem:  23-Aug-13 06:30    Hemoglobin (CBC)  8.1   WBC (CBC)  13.8   RBC (CBC)  2.91   Hematocrit (CBC)  24.9   Platelet Count (CBC) 177   MCV 86   MCH 28.0   MCHC 32.7   RDW 14.2   Neutrophil % 70.1   Lymphocyte % 19.1   Monocyte % 8.9   Eosinophil % 1.4   Basophil % 0.5   Neutrophil #  9.7   Lymphocyte # 2.6   Monocyte #  1.2   Eosinophil # 0.2   Basophil # 0.1 (Result(s) reported on 22 Aug 2012 at 07:06AM.)   Assessment/Plan:  Assessment/Plan:   Assessment LGI bleeding from cecal area. S/P embolization.    Plan Will prep  patient today for colonoscopy tomorrow AM. Will transfuse another unit of blood. Thanks.   Electronic Signatures: OVerdie Shire(MD)  (Signed 23-Aug-13 12:33)  Authored: Chief Complaint, VITAL SIGNS/ANCILLARY NOTES, Brief Assessment, Lab Results, Assessment/Plan   Last Updated: 23-Aug-13 12:33 by OVerdie Shire(MD)

## 2015-04-19 NOTE — Consult Note (Signed)
Chief Complaint:   Subjective/Chief Complaint 3 bloody stools this AM. Bleeding scan positive near cecal region.   VITAL SIGNS/ANCILLARY NOTES: **Vital Signs.:   22-Aug-13 12:42   Vital Signs Type Routine   Temperature Temperature (F) 98.5   Celsius 36.9   Temperature Source oral   Respirations Respirations 17   Systolic BP Systolic BP 295   Diastolic BP (mmHg) Diastolic BP (mmHg) 54   Pulse Lying Pulse Lying 621   Systolic BP Systolic BP 308   Diastolic BP (mmHg) Diastolic BP (mmHg) 55   Pulse Pulse Sitting 119   Pulse Ox % Pulse Ox % 100   Pulse Ox Activity Level  With exertion   Oxygen Delivery Room Air/ 21 %   Brief Assessment:   Cardiac Regular    Respiratory clear BS    Gastrointestinal Normal   Lab Results: Routine Chem:  22-Aug-13 05:02    Glucose, Serum  142   BUN 11   Creatinine (comp) 0.88   Sodium, Serum 142   Potassium, Serum 4.1   Chloride, Serum  108   CO2, Serum 28   Calcium (Total), Serum  8.2   Anion Gap  6   Osmolality (calc) 285   eGFR (African American) >60   eGFR (Non-African American) >60 (eGFR values <83m/min/1.73 m2 may be an indication of chronic kidney disease (CKD). Calculated eGFR is useful in patients with stable renal function. The eGFR calculation will not be reliable in acutely ill patients when serum creatinine is changing rapidly. It is not useful in  patients on dialysis. The eGFR calculation may not be applicable to patients at the low and high extremes of body sizes, pregnant women, and vegetarians.)   Hemoglobin A1c ([facility])  6.7 (The American Diabetes Association recommends that a primary goal of therapy should be <7% and that physicians should reevaluate the treatment regimen in patients with HbA1c values consistently >8%.)   Cholesterol, Serum 85   Triglycerides, Serum 74   HDL (INHOUSE)  26   VLDL Cholesterol Calculated 15   LDL Cholesterol Calculated 44 (Result(s) reported on 21 Aug 2012 at 05:37AM.)  Routine Coag:   22-Aug-13 05:02    Prothrombin 14.4   INR 1.1 (INR reference interval applies to patients on anticoagulant therapy. A single INR therapeutic range for coumarins is not optimal for all indications; however, the suggested range for most indications is 2.0 - 3.0. Exceptions to the INR Reference Range may include: Prosthetic heart valves, acute myocardial infarction, prevention of myocardial infarction, and combinations of aspirin and anticoagulant. The need for a higher or lower target INR must be assessed individually. Reference: The Pharmacology and Management of the Vitamin K  antagonists: the seventh ACCP Conference on Antithrombotic and Thrombolytic Therapy. CMVHQI.6962Sept:126 (3suppl): [id number] A HCT value >55% may artifactually increase the PT.  In one study,  the increase was an average of 25%. Reference:  "Effect on Routine and Special Coagulation Testing Values of Citrate Anticoagulant Adjustment in Patients with High HCT Values." American Journal of Clinical Pathology 2006;126:400-405.)  Routine Hem:  22-Aug-13 05:02    WBC (CBC) 8.9   RBC (CBC)  3.37   Hemoglobin (CBC)  9.5   Hematocrit (CBC)  28.4   Platelet Count (CBC) 208   MCV 84   MCH 28.2   MCHC 33.4   RDW 14.4   Neutrophil % 55.8   Lymphocyte % 30.3   Monocyte % 11.2   Eosinophil % 2.0   Basophil % 0.7   Neutrophil #  5.0   Lymphocyte # 2.7   Monocyte # 1.0   Eosinophil # 0.2   Basophil # 0.1 (Result(s) reported on 21 Aug 2012 at 05:43AM.)   Radiology Results: Nuclear Med:    22-Aug-13 11:38, GI Blood Loss Study - Nuc Med   GI Blood Loss Study - Nuc Med    REASON FOR EXAM:    BRBPR  COMMENTS:       PROCEDURE: NM  - NM GI BLOOD LOSS STUDY  - Aug 21 2012 11:38AM     RESULT: Comparison: None.    Radiopharmaceutical: 23.855 mCi Tc-48m    Technique: Standard departmental tagged red cell scan was performed.  Dynamic, anterior planar images of the abdomen were obtained over 60   minutes. Then  additional anterior planar images were obtained for   approximately 30 minutes.    Findings:   There is normal distribution of radiotracer material. At approximately 60   minutes, an area of abnormally radiotracer accumulation is seen within   the region of the right lower quadrant. The scan was then continued for   an additional approximate 30 minutes. This area increased in degree of   activity. However, it it did not peristalse during this examination. The   study was terminated as the patient's nurse stated the patient needed to   return to the CCU.    IMPRESSION:   Abnormal accumulation of radiotracer activity in the right lower quadrant   is concerning for hemorrhage. This is of uncertain location as it did not   peristalse during the examination. This may represent active blood   extravasation into the cecum or proximal ascending colon.    This was called to Dr. SHillary Bowat 1150 hours 08/21/2012.  Dictation site: 2          Verified By: RGregor Hams M.D., MD   Assessment/Plan:  Assessment/Plan:   Assessment LGI bleeding from cecal area. Cecum looked normal on previous colon. 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 Either diverticular bleeding or bleeding from AVM's?    Plan Vascular surgery consult for poss angiography. If angiography negative, then will prep patient for colonoscopy tomorrow AM. Thanks.   Electronic Signatures: OVerdie Shire(MD)  (Signed [id number])  Authored: Chief Complaint, VITAL SIGNS/ANCILLARY NOTES, Brief Assessment, Lab Results, Radiology Results, Assessment/Plan   Last Updated: 22-Aug-13 13:07 by OVerdie Shire(MD)

## 2015-04-19 NOTE — Op Note (Signed)
PATIENT NAME:  Evan [name], BROD MR#:  [id number] DATE OF BIRTH:  11/17/1945  DATE OF PROCEDURE:  08/21/2012  PREOPERATIVE DIAGNOSES:  1. Severe lower gastrointestinal bleeding with need for blood resuscitation.  2. Morbid obesity.  3. Positive bleeding scan for the right lower quadrant.   POSTOPERATIVE DIAGNOSES:  1. Severe lower gastrointestinal bleeding with need for blood resuscitation.  2. Morbid obesity.  3. Positive bleeding scan for the right lower quadrant.   PROCEDURE PERFORMED:   1. Catheter placement into ileocolic branch of  superior mesenteric artery.  2. Aortogram and selective SMA arteriogram.  3. Microbead embolization of ileocolic and right colic branches with 384 - 700 micron polyvinyl alcohol beads.  4. StarClose closure device,  right femoral artery.   SURGEON: Algernon Huxley, MD   ANESTHESIA: Local with moderate conscious sedation.   ESTIMATED BLOOD LOSS: Minimal.   INDICATION FOR PROCEDURE: This is a 70 year old African American male with lower GI bleeding which is brisk. He had a positive bleeding scan for the right lower quadrant. We are asked to embolize this.   DESCRIPTION OF PROCEDURE: The patient was brought to the Vascular Interventional Radiology Suite. The groins were shaved and prepped and draped, and a sterile surgical field was created. The right femoral head was localized with fluoroscopy, and the right femoral head was accessed without difficulty with a Seldinger needle. A J-wire and 5-French sheath was placed. A pigtail catheter was placed in the aorta, and an AP aortogram was performed. This showed no issues with the origins of the mesenteric or renal vessels as could be determined. I was able to selectively cannulate the SMA with a C2 catheter without difficulty, and imaging was performed of the SMA. I then advanced a Progreat microcatheter down into the ileocolic branch of the superior mesenteric artery. Selective imaging was performed, and then 0.5 mL  of 500 - 700 micron polyvinyl alcohol beads was deployed. I then pulled up to a larger branch of the superior mesenteric artery which fed both the right colic and the ileocolic regions and deployed another 0.5 mL of the same beads. At this point there was no blush seen. The diagnostic catheter was removed. Oblique arteriogram was performed of the right femoral artery, and a StarClose closure device was deployed in the usual fashion with excellent hemostatic result. The patient tolerated the procedure well and was taken to the recovery room in stable condition.   ____________________________ Algernon Huxley, MD jsd:cbb D: 08/21/2012 17:06:38 ET T: 08/21/2012 17:17:23 ET JOB#: [id number]  cc: Algernon Huxley, MD, <Dictator> Algernon Huxley MD ELECTRONICALLY SIGNED 08/25/2012 8:55

## 2015-04-19 NOTE — H&P (Signed)
PATIENT NAME:  Evan [name], Evan [name] MR#:  [id number] DATE OF BIRTH:  Nov 06, 1945  DATE OF ADMISSION:  08/20/2012  PRIMARY CARE PHYSICIAN: Dr. Raechel Ache ER PHYSICIAN: Dr. Michel Santee  ADMITTING PHYSICIAN: Dr. Pearletha Furl  PRESENTING COMPLAINT: Rectal bleeding x1 day.  HISTORY OF PRESENT ILLNESS: Patient is a 70 year old man who was in his usual state of health until this evening started having sudden onset of rectal bleeding. Bleeding was painless, frank bright red blood per rectum. Denies any stool associated with it. Denies any nausea. No vomiting. No episode of diarrhea prior to this. No history of change in medication, long distance travel or sick contact. Denies any palpitations. For this he presented to the Emergency Room. Of note, patient had about four episodes of the rectal bleeding this evening, last episode was also associated with syncopal episode in which he became dizzy and fell in bathroom but regained consciousness immediately afterwards. For this he was brought to the Emergency Room and referred to hospitalist for further evaluation. Patient denies any PND, orthopnea, pedal edema. No chest pain.    REVIEW OF SYSTEMS: CONSTITUTIONAL: Positive for fatigue. No weight loss or weight gain. No fever. EYES: No blurred vision, redness or discharge. ENT: No tinnitus, epistaxis, or difficulty swallowing. RESPIRATORY: No cough, shortness of breath. CARDIOVASCULAR: No chest pain, edema, arrhythmia, palpitations. Positive for syncope. GASTROINTESTINAL: No nausea, no vomiting, no diarrhea, no abdominal pain. Positive for the rectal bleeding. No change in bowel habits prior to this. GENITOURINARY: No dysuria, frequency, or incontinence. ENDOCRINE: No polyuria, polydipsia, heat or cold intolerance. HEMATOLOGIC: No anemia, easy bruising, or swollen glands. SKIN: No rash, change in hair or skin texture. MUSCULOSKELETAL: No joint pain, redness, or limited activity. NEURO: No numbness, dementia, headache or tremors. PSYCH: No  anxiety, depression.   PAST MEDICAL HISTORY:  1. 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  2. Hypertension.  3. Hyperlipidemia.  4. History of atrial flutter, status post ablation 2010.  5. Prior history of diskitis status post antibiotic treatment.  6. History of benign prostatic hypertrophy.   PAST SURGICAL HISTORY:  1. Cholecystectomy in 2011.  2. Patient had colonoscopy in 2010 which showed diverticulosis, small mouth diverticulosis and one polyp which was excised.   SOCIAL HISTORY: Lives at home with wife. No alcohol, tobacco, or recreational drug use. Patient is a retired Designer, industrial/product.   FAMILY HISTORY: Positive for hypertension, diabetes, coronary artery disease, CVA in first-degree relatives. Other history of ovarian cancer in the sister.   ALLERGIES: Penicillin.   MEDICATIONS:  1. Aspirin 81 mg daily. 2. Tamsulosin 0.4 mg daily.  3. Pravastatin 40 mg daily.  4. Amlodipine benazepril 10/20, 1 tablet daily.  5. Fluticasone nasal spray once daily.  6. Fish oil 1000 mg 1 tablet twice daily.  7. Glucosamine plus chondroitin 1 tablet twice a day. 8. Dorzolamide timolol  ophthalmic solution to each affected eye twice daily.  9. Lumigan 0.01% ophthalmic solution once daily.  10. Centrum Silver 1 tablet daily.  11. Vitamin D3 2000 units once a day.   PHYSICAL EXAMINATION:  VITAL SIGNS: Temperature 98.6, pulse 61, respiratory rate 20, blood pressure 138/63 on arrival, 96/58 was recorded in the Emergency Room at which time IV fluids were started, currently 108/60, oxygen saturation 92% on room air.   GENERAL: Well built, well nourished male lying on the gurney awake, alert, oriented to time, place, and person, in no distress.   HEENT: Atraumatic, normocephalic. Pupils equal, reactive to light and accommodation. Extraocular movement intact. Mucous membranes pink, moist.  NECK: Supple. No JV distention.   CHEST: Clear to auscultation.   HEART: Regular rate and rhythm. No  murmurs.   ABDOMEN: Full, moves with respiration, nontender. Bowel sounds normoactive. No organomegaly.   EXTREMITIES: No edema, clubbing, deformity.   NEUROLOGICAL: No focal motor or sensory deficit.   PSYCH: Affect appropriate to situation.   LABORATORY, DIAGNOSTIC, AND RADIOLOGICAL DATA: EKG shows sinus tachycardia, rate of 102 with PVCs and bigeminy. CBC: White count 9.7, hemoglobin 13, platelets 254, neutrophil count 62. Chemistry unremarkable. Potassium 4.4, sodium 138, creatinine 1 up from previous level of 0.8 from five months ago. BUN 17, glucose 188, calcium 9.2 normal LFT. Troponin negative. Urinalysis is negative, showed calcium oxalate crystals, INR 1 with PT of 13.   IMPRESSION:  1. Syncopal episode, likely secondary to orthostatic hypotension from volume loss versus vasovagal, rule out arrhythmia in view of prior history of atrial flutter.  2. Bright red blood per rectum, most likely diverticular bleed.  3. History of hypertension, stable. 4. History of hyperlipidemia.  5. History of borderline diabetes on diet control.   PLAN: Admit to general medical floor under telemetry, n.p.o. for now, IV fluid for rehydration. Type and hold 1 unit of blood.  Check hemoglobin and hematocrit q.8 hours. GI consult in a.m. Sequential TEDs. Hold aspirin tonight. Hold antihypertensive medication today. GI prophylaxis with Protonix. Defer bowel prep till morning after bleeding slows down.   CODE STATUS: FULL CODE.   TOTAL PATIENT CARE TIME: 50 minutes.   ____________________________ Jules Husbands Pearletha Furl, MD mia:cms D: 08/20/2012 00:48:29 ET T: 08/20/2012 06:34:40 ET JOB#: [id number]  cc: Mervil Wacker I. Pearletha Furl, MD, <Dictator> Christena Flake. Raechel Ache, MD Carola Frost MD ELECTRONICALLY SIGNED 08/21/2012 0:28

## 2015-04-19 NOTE — Consult Note (Signed)
Chief Complaint:   Subjective/Chief Complaint Feels well. No bleeding all day yest though hgb did drop after blood transfusion. Hgb back up again. Tolerated liquid diet.   VITAL SIGNS/ANCILLARY NOTES: **Vital Signs.:   25-Aug-13 07:00   Vital Signs Type reassess blood pressure   Temperature Temperature (F) 98.7   Celsius 37   Temperature Source oral   Pulse Pulse 82   Pulse source if not from Vital Sign Device per cardiac monitor   Respirations Respirations 17   Systolic BP Systolic BP 938   Diastolic BP (mmHg) Diastolic BP (mmHg) 47   Mean BP 71   Pulse Ox % Pulse Ox % 91   Pulse Ox Activity Level  At rest   Oxygen Delivery Room Air/ 21 %   Pulse Ox Heart Rate 100   Brief Assessment:   Cardiac Regular    Respiratory normal resp effort  clear BS    Gastrointestinal Normal   Lab Results: Routine Chem:  25-Aug-13 02:21    Glucose, Serum  131   BUN  2   Creatinine (comp) 0.71   Sodium, Serum 145   Potassium, Serum 3.7   Chloride, Serum  109   CO2, Serum 31   Calcium (Total), Serum  7.8   Anion Gap  5   Osmolality (calc) 287   eGFR (African American) >60   eGFR (Non-African American) >60 (eGFR values <68m/min/1.73 m2 may be an indication of chronic kidney disease (CKD). Calculated eGFR is useful in patients with stable renal function. The eGFR calculation will not be reliable in acutely ill patients when serum creatinine is changing rapidly. It is not useful in  patients on dialysis. The eGFR calculation may not be applicable to patients at the low and high extremes of body sizes, pregnant women, and vegetarians.)  Routine Hem:  25-Aug-13 02:21    WBC (CBC)  12.0   RBC (CBC)  2.54   Hemoglobin (CBC)  7.6   Hematocrit (CBC)  22.1   Platelet Count (CBC) 188   MCV 87   MCH 29.7   MCHC 34.3   RDW  14.8   Neutrophil % 67.0   Lymphocyte % 20.2   Monocyte % 9.8   Eosinophil % 2.5   Basophil % 0.5   Neutrophil #  8.1   Lymphocyte # 2.4   Monocyte #  1.2    Eosinophil # 0.3   Basophil # 0.1 (Result(s) reported on 24 Aug 2012 at 03:39AM.)   Assessment/Plan:  Assessment/Plan:   Assessment Diverticular bleeding. Bleeding stopped.    Plan Agree with advancing diet. If no further bleeding, patient can be discharged tomorrow. If recurrent bleeding, then surgery will need to be notified. Will sign off. Thanks.   Electronic Signatures: OVerdie Shire(MD)  (Signed [id number])  Authored: Chief Complaint, VITAL SIGNS/ANCILLARY NOTES, Brief Assessment, Lab Results, Assessment/Plan   Last Updated: 25-Aug-13 08:38 by OVerdie Shire(MD)

## 2015-04-19 NOTE — Consult Note (Signed)
Chief Complaint:   Subjective/Chief Complaint Small amount of bleeding yest. None so far today. No further drop in hgb.   VITAL SIGNS/ANCILLARY NOTES: **Vital Signs.:   28-Aug-13 05:06   Vital Signs Type Routine   Temperature Temperature (F) 99.3   Celsius 37.3   Temperature Source Oral   Pulse Pulse 92   Respirations Respirations 16   Systolic BP Systolic BP 916   Diastolic BP (mmHg) Diastolic BP (mmHg) 83   Mean BP 99   Pulse Ox % Pulse Ox % 97   Pulse Ox Activity Level  At rest   Oxygen Delivery Room Air/ 21 %   Brief Assessment:   Cardiac Regular    Respiratory clear BS    Gastrointestinal Normal   Lab Results: Routine Hem:  28-Aug-13 05:38    WBC (CBC)  12.0   RBC (CBC)  2.72   Hemoglobin (CBC)  7.9   Hematocrit (CBC)  23.9   Platelet Count (CBC) 324   MCV 88   MCH 29.2   MCHC 33.3   RDW  14.8   Neutrophil % 68.8   Lymphocyte % 18.4   Monocyte % 9.4   Eosinophil % 2.8   Basophil % 0.6   Neutrophil #  8.3   Lymphocyte # 2.2   Monocyte #  1.1   Eosinophil # 0.3   Basophil # 0.1 (Result(s) reported on 27 Aug 2012 at [facility].)   Assessment/Plan:  Assessment/Plan:   Assessment LGI bleeding. NO active bleeding now. Perhaps old blood passed yest.    Plan Moniter hgb. Watch for any bleeding. If remains stable, prob discharge by tomorrow. Thanks.   Electronic Signatures: Verdie Shire (MD)  (Signed 28-Aug-13 13:18)  Authored: Chief Complaint, VITAL SIGNS/ANCILLARY NOTES, Brief Assessment, Lab Results, Assessment/Plan   Last Updated: 28-Aug-13 13:18 by Verdie Shire (MD)

## 2015-04-19 NOTE — Consult Note (Signed)
  General Aspect GI bleed right colon    Present Illness Patient is a 70-year-old man who was in his usual state of health until yesterday evening when he began having rectal bleeding. Bleeding was painless, frank bright red blood per rectum. The bleeding began abruptly and there were multiple episodes that have contiued. The patient denies any nausea. No vomiting. No episode of diarrhea prior to this. No history of change in medication, long distance travel or sick contact. Denies any palpitations.  One episode was also associated with syncopal episode in which he became dizzy and fell in bathroom but regained consciousness immediately afterwards.  Bleeding scan this morning was + for right colon bleeding and I am asked to evaluate.  The patient denies chest pain, he takes 81 mg of ASA daily no anticoagulants.  PAST MEDICAL HISTORY:  1. 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  2. Hypertension.  3. Hyperlipidemia.  4. History of atrial flutter, status post ablation 2010.  5. Prior history of diskitis status post antibiotic treatment.  6. History of benign prostatic hypertrophy.   PAST SURGICAL HISTORY:  1. Cholecystectomy in 2011.  2. Patient had colonoscopy in 2010 which showed diverticulosis, and one polyp which was excised.   Home Medications: Medication Instructions Status  tamsulosin 0.4 mg oral capsule 1 cap(s) orally once a day Active  fluticasone 50 mcg/inh nasal spray 1 spray(s) nasal once a day Active  Glucosamine & Chondroitin with MSM oral tablet 1 tab(s) orally 2 times a day Active  Centrum Silver oral tablet 1 tab(s) orally once a day Active  amlodipine-benazepril 10 mg-20 mg capsule 1 cap(s) orally once a day Active  Fish Oil 1200 mg oral capsule 1 cap(s) orally 2 times a day Active  Vitamin D3 2000 intl units oral tablet 1 tab(s) orally once a day Active  aspirin 81 mg oral tablet 1  orally once a day  Active  pravastatin 40 mg oral tablet 1 tab(s) orally once a day (at  bedtime)  Active  Lumigan 0.01% ophthalmic solution 1 drop(s) to each affected eye once a day (in the evening) Active  dorzolamide-timolol 2%-0.5% ophthalmic solution 1 drop(s) to each affected eye 2 times a day Active    Penicillin: Hives  Case History:   Family History Non-Contributory    Social History negative tobacco, negative ETOH, negative Illicit drugs   Review of Systems:   Fever/Chills No    Cough No    Sputum No    Abdominal Pain No    Diarrhea No    Constipation No    Nausea/Vomiting No    SOB/DOE No    Chest Pain No    Telemetry Reviewed NSR    Dysuria No    Tolerating PT No    Tolerating Diet No    Medications/Allergies Reviewed Medications/Allergies reviewed   Physical Exam:   GEN well developed, well nourished, ill appearing    HEENT pale conjunctivae, PERRL, hearing intact to voice    NECK supple  trachea midline    RESP normal resp effort  no use of accessory muscles    CARD regular rate  No LE edema  no JVD    ABD denies tenderness  soft  obese    EXTR negative cyanosis/clubbing, negative edema    SKIN No rashes, No ulcers, skin turgor poor    NEURO cranial nerves intact, follows commands, motor/sensory function intact    PSYCH alert, A+O to time, place, person, good insight   Nursing/Ancillary Notes: **  Vital Signs.:   22-Aug-13 08:18   Vital Signs Type Upon Transfer   Temperature Temperature (F) 98.8   Celsius 37.1   Pulse Pulse 101   Respirations Respirations 25   Systolic BP Systolic BP 141   Diastolic BP (mmHg) Diastolic BP (mmHg) 68   Mean BP 92   Pulse Ox % Pulse Ox % 95   Pulse Ox Activity Level  At rest   Oxygen Delivery Room Air/ 21 %   Nurse Fingerstick (mg/dL) FSBS (fasting range 65-99 mg/dL) 170   Comments/Interventions  Per Hyperglycemia Protocol ([facility])   Thyroid:  21-Aug-13 06:04    Thyroid Stimulating Hormone 0.561 (0.45-4.50 (International Unit)  ----------------------- Pregnant patients have   different reference  ranges for TSH:  - - - - - - - - - -  Pregnant, first trimetser:  0.36 - 2.50 uIU/mL)  Hepatic:  20-Aug-13 22:36    Bilirubin, Total 0.8   Alkaline Phosphatase 74   SGPT (ALT) 22   SGOT (AST) 21   Total Protein, Serum 6.6   Albumin, Serum  3.3  Routine BB:  20-Aug-13 22:36    ABO Group + Rh Type -   Antibody Screen -    23:08    ABO Group + Rh Type B Positive   Antibody Screen NEGATIVE (Result(s) reported on 20 Aug 2012 at 12:28AM.)  22-Aug-13 13:07    Crossmatch Unit 1 Ready   Crossmatch Unit 2 Ready (Result(s) reported on 21 Aug 2012 at 01:18PM.)  Routine Chem:  20-Aug-13 22:36    Glucose, Serum  188   BUN 17   Creatinine (comp) 1.06   Sodium, Serum 138   Potassium, Serum 4.4   Chloride, Serum 103   CO2, Serum 30   Calcium (Total), Serum 9.2   Anion Gap  5   Osmolality (calc) 282   eGFR (African American) >60   eGFR (Non-African American) >60 (eGFR values <60mL/min/1.73 m2 may be an indication of chronic kidney disease (CKD). Calculated eGFR is useful in patients with stable renal function. The eGFR calculation will not be reliable in acutely ill patients when serum creatinine is changing rapidly. It is not useful in  patients on dialysis. The eGFR calculation may not be applicable to patients at the low and high extremes of body sizes, pregnant women, and vegetarians.)   Result Comment TYPE AND SCREEN - VERIFIED IN ERROR...TPL  Result(s) reported on 19 Aug 2012 at 11:23PM.  21-Aug-13 06:04    Magnesium, Serum  1.6 (1.8-2.4 THERAPEUTIC RANGE: 4-7 mg/dL TOXIC: > 10 mg/dL  -----------------------)  22-Aug-13 05:02    Glucose, Serum  142   BUN 11   Creatinine (comp) 0.88   Sodium, Serum 142   Potassium, Serum 4.1   Chloride, Serum  108   CO2, Serum 28   Calcium (Total), Serum  8.2   Anion Gap  6   Osmolality (calc) 285   eGFR (African American) >60   eGFR (Non-African American) >60 (eGFR values <60mL/min/1.73 m2 may be an indication  of chronic kidney disease (CKD). Calculated eGFR is useful in patients with stable renal function. The eGFR calculation will not be reliable in acutely ill patients when serum creatinine is changing rapidly. It is not useful in  patients on dialysis. The eGFR calculation may not be applicable to patients at the low and high extremes of body sizes, pregnant women, and vegetarians.)   Hemoglobin A1c ([facility])  6.7 (The American Diabetes Association recommends that a primary goal of therapy   should be <7% and that physicians should reevaluate the treatment regimen in patients with HbA1c values consistently >8%.)   Cholesterol, Serum 85   Triglycerides, Serum 74   HDL (INHOUSE)  26   VLDL Cholesterol Calculated 15   LDL Cholesterol Calculated 44 (Result(s) reported on 21 Aug 2012 at 05:37AM.)  Cardiac:  20-Aug-13 22:36    Troponin I < 0.02 (0.00-0.05 0.05 ng/mL or less: NEGATIVE  Repeat testing in 3-6 hrs  if clinically indicated. >0.05 ng/mL: POTENTIAL  MYOCARDIAL INJURY. Repeat  testing in 3-6 hrs if  clinically indicated. NOTE: An increase or decrease  of 30% or more on serial  testing suggests a  clinically important change)  21-Aug-13 06:04    Troponin I < 0.02 (0.00-0.05 0.05 ng/mL or less: NEGATIVE  Repeat testing in 3-6 hrs  if clinically indicated. >0.05 ng/mL: POTENTIAL  MYOCARDIAL INJURY. Repeat  testing in 3-6 hrs if  clinically indicated. NOTE: An increase or decrease  of 30% or more on serial  testing suggests a  clinically important change)    15:12    Troponin I < 0.02 (0.00-0.05 0.05 ng/mL or less: NEGATIVE  Repeat testing in 3-6 hrs  if clinically indicated. >0.05 ng/mL: POTENTIAL  MYOCARDIAL INJURY. Repeat  testing in 3-6 hrs if  clinically indicated. NOTE: An increase or decrease  of 30% or more on serial  testing suggests a  clinically important change)  Routine UA:  20-Aug-13 22:36    Color (UA) Yellow   Clarity (UA) Hazy   Glucose (UA)  Negative   Bilirubin (UA) Negative   Ketones (UA) Trace   Specific Gravity (UA) 1.021   Blood (UA) Negative   pH (UA) 5.0   Protein (UA) 100 mg/dL   Nitrite (UA) Negative   Leukocyte Esterase (UA) Negative (Result(s) reported on 19 Aug 2012 at 11:35PM.)   RBC (UA) 1 /HPF   WBC (UA) 4 /HPF   Bacteria (UA) NONE SEEN   Epithelial Cells (UA) <1 /HPF   Mucous (UA) PRESENT   Hyaline Cast (UA) 6 /LPF   Granular Cast (UA) 8 /LPF   Calcium Oxalate Crystal (UA) PRESENT (Result(s) reported on 19 Aug 2012 at 11:35PM.)  Routine Coag:  20-Aug-13 22:36    Prothrombin 13.9   INR 1.0 (INR reference interval applies to patients on anticoagulant therapy. A single INR therapeutic range for coumarins is not optimal for all indications; however, the suggested range for most indications is 2.0 - 3.0. Exceptions to the INR Reference Range may include: Prosthetic heart valves, acute myocardial infarction, prevention of myocardial infarction, and combinations of aspirin and anticoagulant. The need for a higher or lower target INR must be assessed individually. Reference: The Pharmacology and Management of the Vitamin K  antagonists: the seventh ACCP Conference on Antithrombotic and Thrombolytic Therapy. Chest.2004 Sept:126 (3suppl): 2045-2335. A HCT value >55% may artifactually increase the PT.  In one study,  the increase was an average of 25%. Reference:  "Effect on Routine and Special Coagulation Testing Values of Citrate Anticoagulant Adjustment in Patients with High HCT Values." American Journal of Clinical Pathology 2006;126:400-405.)  22-Aug-13 05:02    Prothrombin 14.4   INR 1.1 (INR reference interval applies to patients on anticoagulant therapy. A single INR therapeutic range for coumarins is not optimal for all indications; however, the suggested range for most indications is 2.0 - 3.0. Exceptions to the INR Reference Range may include: Prosthetic heart valves, acute myocardial  infarction, prevention of myocardial infarction, and combinations of aspirin   and anticoagulant. The need for a higher or lower target INR must be assessed individually. Reference: The Pharmacology and Management of the Vitamin K  antagonists: the seventh ACCP Conference on Antithrombotic and Thrombolytic Therapy. LPFXT.0240 Sept:126 (3suppl): [id number]. A HCT value >55% may artifactually increase the PT.  In one study,  the increase was an average of 25%. Reference:  "Effect on Routine and Special Coagulation Testing Values of Citrate Anticoagulant Adjustment in Patients with High HCT Values." American Journal of Clinical Pathology 2006;126:400-405.)  Routine Hem:  20-Aug-13 22:36    WBC (CBC) 9.7   RBC (CBC) 4.90   Hemoglobin (CBC) 13.4   Hematocrit (CBC) 41.5   Platelet Count (CBC) 254   MCV 85   MCH 27.4   MCHC 32.4   RDW 14.4   Neutrophil % 62.0   Lymphocyte % 27.8   Monocyte % 8.0   Eosinophil % 1.1   Basophil % 1.1   Neutrophil # 6.0   Lymphocyte # 2.7   Monocyte # 0.8   Eosinophil # 0.1   Basophil # 0.1 (Result(s) reported on 19 Aug 2012 at 11:15PM.)  21-Aug-13 06:04    Hematocrit (CBC)  36.5 (Result(s) reported on 20 Aug 2012 at 06:39AM.)    10:22    Hemoglobin (CBC)  11.6 (Result(s) reported on 20 Aug 2012 at 10:45AM.)    21:16    Hemoglobin (CBC)  10.1 (Result(s) reported on 20 Aug 2012 at 09:40PM.)  22-Aug-13 05:02    WBC (CBC) 8.9   RBC (CBC)  3.37   Hemoglobin (CBC)  9.5   Hematocrit (CBC)  28.4   Platelet Count (CBC) 208   MCV 84   MCH 28.2   MCHC 33.4   RDW 14.4   Neutrophil % 55.8   Lymphocyte % 30.3   Monocyte % 11.2   Eosinophil % 2.0   Basophil % 0.7   Neutrophil # 5.0   Lymphocyte # 2.7   Monocyte # 1.0   Eosinophil # 0.2   Basophil # 0.1 (Result(s) reported on 21 Aug 2012 at 05:43AM.)     Impression 1.  GI bleed        patient has continued to bleed for two days         coagulation parameters are normal        I recommend angiography  and embolization; GI scan localizes the area to the right colon.        risks and benefits as well as alternatives are reviewed and patient agrees to proceed. 2.  Anemia of blood loss         per [employer] Dr transfuse as needed         serial Hgb 3. Hypertension          hold BP meds as the patient is bleeding adn boarderline hypotensive 4.  Hyperglycemia          Acu checks           sliding scale as needed         diabetic education/nutritional counceling 5.  Hyperlipidemia           contnue statin.           hold fish oil    Plan level 5 consult   Electronic Signatures: Hortencia Pilar (MD)  (Signed 22-Aug-13 13:36)  Authored: General Aspect/Present Illness, Home Medications, Allergies, History and Physical Exam, Vital Signs, Labs, Impression/Plan   Last Updated: 22-Aug-13 13:36 by Hortencia Pilar (MD)

## 2015-04-19 NOTE — Consult Note (Signed)
Brief Consult Note: Diagnosis: Rectal bleeding.   Patient was seen by consultant.   Consult note dictated.   Comments: Appreciate consult for 70 y/o [place] gentleman for onset of painless rectal bleeding yesterday. States passing large amounts of blood 4 or 5 times and having dizziness/syncopal episode prior to ED visit. Reports no bloody stools since about 0200 this am. Noted in chart a non-bloody stool at 0600.  Denies abdominal pain and has been hemodynamically stable. Last hemoglobin 11.6. Denies further dizziness/fatigue/fainting. Not orthostatic today.  Denies furter GI and other complaints, with the exception of mild left knee tenderness- better with ice, which he attributes to his fall yesterday. Abdomen nontender on exam, soft, with active bowel sounds. Does have history of adenomatous polyps. Last colonoscopy 2010, without polyps, but with diverticula. Impression and plan: Rectal bleeding: likely diverticular.  No bleeding since 0200 today. feeling better. If he should start to actively bleed again, would recommned GI bleeding scan. Otherwise will plan for colonoscopy, likely on Friday. I have discussed the indications, risks, and benefits with him and he is agreeable.  Electronic Signatures: Stephens November H (NP)  (Signed 21-Aug-13 17:35)  Authored: Brief Consult Note   Last Updated: 21-Aug-13 17:35 by Theodore Demark (NP)

## 2015-04-19 NOTE — Consult Note (Signed)
Colonoscopy to cecum performed under conscious sedation. No blood remaining in the colon. 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 No AVM's or polyps seen. Pt likely bled from one of the tics either in cecum or ascending colon. Full liquid diet and advance as tolerated. Transfuse 1 more unit this AM. Moniter hgb. If patient has active bleeding again, then consult gen surgery for possible removal of cecal area. Thanks.  Electronic Signatures: Verdie Shire (MD)  (Signed on 24-Aug-13 08:51)  Authored  Last Updated: 24-Aug-13 08:51 by Verdie Shire (MD)

## 2015-04-19 NOTE — Consult Note (Signed)
PATIENT NAME:  Evan [name], Evan [name] MR#:  [id number] DATE OF BIRTH:  04/20/1945  DATE OF CONSULTATION:  08/20/2012  REFERRING PHYSICIAN:   CONSULTING PHYSICIAN:  Theodore Demark, NP  PRIMARY CARE PHYSICIAN:  Dr. Raechel Ache  HISTORY OF PRESENT ILLNESS: Evan [name] is a very pleasant 70 year old African American gentleman admitted for the onset of painless rectal bleeding yesterday. He states he passed four to five large, bloody stools and had a dizziness/ syncopal episode prior to the ED visit last night. He reports no bloody stools since about 02:00 this morning. Noted in chart a nonbloody stool at 06:00 today. Denies abdominal pain, has been hemodynamically stable. He states he had no other symptoms prior to this before the onset of rectal bleeding. Last hemoglobin was 11.6. Denies further dizziness, fatigue, or fainting, not orthostatic today. Denies all other complaints with the exception of mild left knee pain, which he states is better with ice and attributes to his fall yesterday.  REVIEW OF SYSTEMS:  Ten -point review negative with the exception of what is noted above.   PAST MEDICAL HISTORY:  1. Borderline diabetes, diet controlled. 2. Hypertension.  3. Hyperlipidemia.  4. History of atrial flutter status post ablation 2010 by Dr. Marcello Moores. He states he has been in normal sinus rhythm since and has been discharged from Dr. Marcello Moores' care. 5. Prior history of diskitis status post antibiotic treatment. 6. History of benign prostatic hypertrophy. 7. Cholecystectomy.  8. History of adenomatous polyps 9. Last colonoscopy in 2010 which had diverticulosis.   SOCIAL HISTORY: Lives with wife. No alcohol, tobacco or recreational drug use. Retired Designer, industrial/product.   FAMILY HISTORY: No known colorectal cancer, liver disease, or ulcers. Significant for hypertension, diabetes, coronary artery disease, cerebrovascular accident, and ovarian cancer.   ALLERGIES: Penicillin.   MEDICATIONS:  1. Aspirin  81 mg p.o. daily.  2. Tamsulosin 0.4 mg p.o. daily.  3. Pravastatin 40 mg p.o. daily. 4. Amlodipine/ benazepril 10/20, one p.o. daily.  5. Fluticasone nasal spray once daily.  6. Fish oil 1000 mg p.o. once a day.  7. Glucosamine chondroitin 1 tab b.i.d.  8. Dorzolamide/ timolol ophthalmic solution drops both eyes b.i.d.  9. Lumigan  0.01% ophthalmic solution both eyes daily.  10. Centrum Silver 1 p.o. daily.  11. Vitamin D3 2000 units p.o. daily.   MOST RECENT LABS: Glucose 188, BUN 17, creatinine 1.06, sodium 138, potassium 4.4, chloride 103, GFR greater than 60, calcium 9.2, magnesium 1.6, total serum protein 6.6, albumin 3.3, total bilirubin 0.8, alkaline phosphatase 74, AST 22, and ALT 22. Troponin less than 0.02 times three. TSH 0.561. WBC 9.7, hemoglobin 11.6, hematocrit 36.5, platelet count 254. Red cells are normocytic. PT 13.9, INR 1.   PHYSICAL EXAMINATION:  VITAL SIGNS: Most recent vital signs: Blood pressure 136/70, pulse 90, temperature 98.8, respiratory rate 20, oxygen saturation 96%.   GENERAL: Very pleasant man lying in bed in no acute distress.   PSYCHIATRIC: Cooperative, logical thought, good memory.   HEENT: Atraumatic, normocephalic. No redness, drainage, or inflammation to the eyes or the nares. Oral mucous membranes are pink and moist.   NECK: Supple. No thyromegaly, lymphadenopathy, or JVD.   LUNGS: Clear to auscultation bilaterally. Respirations eupneic.   CARDIAC: S1, S2. Regular rate and rhythm. No murmurs, rubs, or gallops. Peripheral pulses 2+. No appreciable edema.   ABDOMEN: Rounded abdomen, soft. Active bowel sounds times four, nondistended, nontender. No peritoneal signs, rebound, tenderness, guarding, hepatosplenomegaly, hernias, or other abnormalities.   RECTAL: Deferred.   GENITOURINARY:  Deferred.   EXTREMITIES: Moves all extremities well times four. Sensation appears to be intact. No edema, clubbing, or deformity.   NEUROLOGICAL: Alert and  oriented times three. Cranial nerves II through XII grossly intact. Speech clear. No facial droop.   IMPRESSION AND PLAN: Rectal bleeding, likely diverticular. No bleeding since 02:00 today, feeling better. If he should start to actively bleed again, would recommend GI bleeding scan. Otherwise, we will plan for colonoscopy likely on Friday. I have discussed the indications, risks, and benefits with him and he is agreeable.   These services were provided by Evan November, MSN, [facility] in collaboration with Dr. Verdie Shire, with whom I have discussed this patient in full.     ____________________________ Theodore Demark, NP chl:bjt D: 08/20/2012 19:32:12 ET T: 08/20/2012 20:56:43 ET JOB#: [id number]  cc: Theodore Demark, NP, <Dictator> [place] SIGNED 08/26/2012 16:21

## 2015-04-22 NOTE — H&P (Signed)
PATIENT NAME:  Evan [name], Evan [name] MR#:  [id number] DATE OF BIRTH:  12/04/1945  DATE OF ADMISSION:  10/03/2013  REFERRING PHYSICIAN: Elta Guadeloupe R. Jacqualine Code, MD  FAMILY PHYSICIAN: Christena Flake. Raechel Ache, MD   REASON FOR ADMISSION: Lower GI bleed.   HISTORY OF PRESENT ILLNESS: The patient is a 70 year old male with a history of borderline diabetes, benign hypertension, hyperlipidemia and atrial flutter, status post ablation therapy. The patient was hospitalized in August 2013 with a lower GI bleed which was severe and presumably diverticular. He required 9 units of packed red blood cells at that time. He has had no recurrence. Has been getting IV iron infusions at Dr. Beverly Gust office since then. Presents to the Emergency Room today with 2 episodes of dark red blood per rectum. Some lower abdominal discomfort. Denies any fevers. No nausea, vomiting or chest pain. In the Emergency Room, the patient's hemoglobin was stable, and he was hemodynamically stable. He is now admitted for further evaluation.   PAST MEDICAL HISTORY:  1. Lower GI bleed, presumably diverticular, requiring 9 units of packed red blood cells.  2. Borderline diabetes.   3. Iron deficiency anemia.  4. Benign hypertension.  5. Hyperlipidemia.  6. History of atrial flutter status post ablation in 2010.  7. History of diskitis.  8. BPH.  9. Status post cholecystectomy.  10. Diverticulosis by colonoscopy.   MEDICATIONS:  1. Iron sulfate 325 mg p.o. daily.  2. Norvasc 5 mg p.o. daily.  3. Vitamin D3 2000 units p.o. daily.  4. Pravastatin 40 mg p.o. daily.  5. Fish oil 1200 mg p.o. daily.  6. Flonase 2 puffs in each nostril daily.   ALLERGIES: PENICILLIN.   SOCIAL HISTORY: Negative for alcohol or tobacco abuse.   FAMILY HISTORY: Positive for hypertension, diabetes, coronary artery disease, stroke and ovarian cancer.   REVIEW OF SYSTEMS:  CONSTITUTIONAL: No fever or change in weight.  EYES: No blurred or double vision. Does have glaucoma, on  drops.  ENT: No tinnitus or hearing loss. No nasal discharge or bleeding. No difficulty swallowing.  RESPIRATORY: No cough or wheezing. Denies hemoptysis. No painful respiration.  CARDIOVASCULAR: No chest pain or orthopnea. No palpitations or syncope.  GASTROINTESTINAL: No nausea, vomiting or diarrhea. No abdominal pain. No change in bowel habits.  GENITOURINARY: No dysuria or hematuria. No incontinence.  ENDOCRINE: No polyuria or polydipsia. No heat or cold intolerance.  HEMATOLOGIC: The patient admits to anemia, but denies easy bruising. Has had bleeding as per HPI.  MUSCULOSKELETAL: The patient denies pain in his neck, back, shoulders, knees or hips. No gout.  NEUROLOGICAL: No numbness or weakness. Denies migraines, stroke or seizures.  PSYCHIATRIC: The patient denies anxiety, insomnia or depression.   PHYSICAL EXAMINATION:  GENERAL: The patient is in no acute distress.  VITAL SIGNS: Remarkable for a blood pressure of 153/91, heart rate of 82 and a respiratory rate of 18. He is afebrile.  HEENT: Normocephalic, atraumatic. Pupils equally round and reactive to light and accommodation. Extraocular movements are intact. Sclerae are nonicteric. Conjunctivae are clear. Oropharynx is clear.  NECK: Supple without JVD or bruits. No adenopathy or thyromegaly is noted.  LUNGS: Clear to auscultation and percussion without wheezes, rales or rhonchi. No dullness. Respiratory effort is normal.  CARDIAC: Regular rate and rhythm with a normal S1 and S2. No significant rubs, murmurs or gallops. PMI is nondisplaced. Chest wall is nontender.  ABDOMEN: Soft with some mild tenderness in the lower quadrants bilaterally. No rebound or guarding. Normoactive bowel sounds. No organomegaly or  masses were appreciated. No hernias or bruits were noted.  RECTAL: Stool was guaiac positive per the Emergency Room physician with gross blood present.  EXTREMITIES: Without clubbing, cyanosis or edema. Pulses were 2+ bilaterally.   SKIN: Warm and dry without rash or lesions.  NEUROLOGIC: Cranial nerves II through XII grossly intact. Deep tendon reflexes were symmetric. Motor and sensory exam is nonfocal.  PSYCHIATRIC: Revealed a patient who is alert and oriented to person, place and time. He was cooperative and used good judgment.   LABORATORY DATA: EKG revealed sinus rhythm with no acute ischemic changes. Urinalysis was unremarkable. White count was 8.6 with a hemoglobin of 16.4. Pro time was 13.4, with an INR of 1.0. Glucose was 157 with a BUN of 15 and a creatinine of 0.87, with a GFR of greater than 60. Sodium 140 with a potassium of 3.8 and a lipase of 151.   ASSESSMENT:  1. Recurrent gastrointestinal bleed.  2. History of diverticulosis.  3. Lower abdominal pain.  4. Obesity.  5. Benign hypertension.  6. Borderline diabetes.  7. History of iron deficiency anemia.   PLAN: The patient will be admitted to the floor with empiric IV Cipro and IV Flagyl with IV Protonix. Clear liquid diet. Will begin IV fluids. Will follow his hemoglobin closely and guaiac stools. Will obtain a GI consult. Will follow his sugars with Accu-Cheks before meals and at bedtime and add sliding scale insulin as needed. Will continue his routine outpatient medications, including his eyedrops. Follow up routine labs in the morning. Further treatment and evaluation will depend upon the patient's progress.   TOTAL TIME SPENT ON THIS PATIENT: 50 minutes.   ____________________________ Leonie Douglas Doy Hutching, MD jds:OSi D: 10/03/2013 13:13:16 ET T: 10/03/2013 13:51:22 ET JOB#: [id number]  cc: Leonie Douglas. Doy Hutching, MD, <Dictator> Christena Flake. Raechel Ache, MD Haili Donofrio Lennice Sites MD ELECTRONICALLY SIGNED 10/03/2013 18:21

## 2015-04-22 NOTE — Consult Note (Signed)
Brief Consult Note: Diagnosis: LGIB.   Patient was seen by consultant.   Consult note dictated.   Orders entered.   Discussed with Attending MD.   Comments: LGIB.   I'd say there is a 90% chance this is a bleeding right sided colonic diverticulum, but given how dark the stool appears, there is a 10% chance this is an upper GI bleed.  Currently stable, hgb unchanged, suspect the bleeding has stopped.   - will start protonix drip - cont to monitor h/h, hemodynamics - if further bleeding, would obtained tagged RBC scan vs perform upper endoscopy. - suspect colonoscopy would be low yield, given likely r tics bleeding which colonoscopy can rarely do anytthing about. - will cont to follow.  Electronic Signatures: Arther Dames (MD)  (Signed 04-Oct-14 16:21)  Authored: Brief Consult Note   Last Updated: 04-Oct-14 16:21 by Arther Dames (MD)

## 2015-04-22 NOTE — Consult Note (Signed)
PATIENT NAME:  Evan [name], Evan [name] MR#:  [id number] DATE OF BIRTH:  [birth year]  DATE OF CONSULTATION:  10/03/2013  REFERRING PHYSICIAN:  Fulton Reek, MD CONSULTING PHYSICIAN:  Arther Dames, MD  HISTORY OF PRESENT ILLNESS:  Evan [name] is a 70 year old male with a history notable for diabetes, hypertension, atrial flutter, who is presenting to the hospital for an episode of hematochezia. Evan [name] reports this was similar to his episode one year ago when he was hospitalized. At approximately 9:30 this morning, he developed an episode of blood mixed brown stool. Then shortly after that he had another episode of very dark red blood. He presented to the hospital and had did not have another episode until about 4:00 p.m. today when he had a third episode of very dark red blood from his rectum. The bleeding was not associated with any abdominal pain. He is not having any nausea, vomiting, or diarrhea.   Of note, Evan [name] was admitted to the hospital in August 2013. At that time he had a embolization performed in his right colon for active bleeding. He had a colonoscopy the following day, which did showed many right-sided tics but no evidence of active bleeding. The recommendation was to consider surgery if he had further bleeding.   PAST MEDICAL HISTORY:  1.  A history of lower GI bleed.  2.  Diabetes.  3.  Iron deficiency anemia.  4.  Hypertension.  5.  Hyperlipidemia.  6.  A-flutter, status post ablation 2010.  7.  BPH.   8.  Diverticulosis.   MEDICATIONS AT HOME: 1.  Iron sulfate 325 daily.  2.  Norvasc 5 mg daily.  3.  Vitamin D3 2000 units daily.  4.  Pravastatin 40 mg daily.  5.  Fish 0il 1200 mg daily.  6.  Flonase 2 puffs in each nostril daily.   ALLERGIES:  PENICILLIN.   SOCIAL HISTORY:  He denies to me any alcohol, tobacco or recreational drugs.   FAMILY HISTORY:  He denies to me any family history of colon cancer or gastric cancer.   REVIEW OF SYSTEMS:  CONSTITUTIONAL:  No  weight gain or weight loss.  No fever or chills. HEENT:  No oral lesions or sore throat. No vision changes. GASTROINTESTINAL:  See HPI.  HEMATOLOGIC AND LYMPHATIC:  No easy bruising or bleeding. CARDIOVASCULAR:  No chest pain or dyspnea on exertion. GENITOURINARY:  No hematuria. INTEGUMENTARY:  No rashes or pruritus PSYCHIATRIC:  No depression/anxiety.  ENDOCRINE:  No heat/cold intolerance, no hair loss or skin changes. ALLERGIC AND IMMUNOLOGIC:  Negative for hives. RESPIRATORY:  No cough, no shortness of breath.  MUSCULOSKELETAL:  No joint swelling or muscle pain.  VITAL SIGNS:  Temperature is 98.5, pulse is 77, respirations are 20, blood pressure was 177/70, pulse ox is 97% on room air.   PHYSICAL EXAMINATION: GENERAL:  Alert and oriented times 4.  No acute distress. Appears stated age. HEENT:  Normocephalic/atraumatic. Extraocular movements are intact. Anicteric. NECK:  Soft, supple. JVP appears normal. No adenopathy. CHEST:  Clear to auscultation. No wheeze or crackle. Respirations unlabored. HEART:  Regular. No murmur, rub, or gallop.  Normal S1 and S2. ABDOMEN:  Positive for abdominal adiposity, a small surgical scar. EXTREMITIES:  No swelling, well perfused. SKIN:  No rash or lesion. Skin color, texture, turgor normal. NEUROLOGICAL:  Grossly intact. PSYCHIATRIC:  Normal tone and affect. MUSCULOSKELETAL:  No joint swelling or erythema.   LABORATORY:  Sodium 140, potassium 3.8, chloride 106, bicarb 26, BUN 15, creatinine 0.87.  Hepatic function panel normal except for total bilirubin of 1.4. White count 8.6, hemoglobin 16, hematocrit 49, platelets are 274, INR is 1.0.   ASSESSMENT AND PLAN:  1.  Rectal bleeding:  Given his prior history of a right-sided colonic bleed in August 2013 that was successfully embolized and the known diverticulosis in that area of the colon, I do suspect this is most likely a right-sided diverticular bleed. However, I have seen the bowel movement myself  and the stool is quite dark. I would say a 10% chance that this could be an upper GI bleed. Therefore, I will go ahead and initiate a Protonix drip.  We will monitor hemodynamics and hemoglobin and watch for further bleeding. If he does have further bleeding, I will perform an upper endoscopy to rule out an upper bleed. A colonoscopy would likely be low-yield in this setting since he had one a year ago and we know he has right-sided diverticulosis. Would also likely proceed with a tag red blood cell scan for localization in the setting of further bleeding.   I will continue to follow.  I do thank you for this consult.   ____________________________ Arther Dames, MD mr:jm D: 10/03/2013 16:17:57 ET T: 10/03/2013 16:37:16 ET JOB#: [id number]  cc: Arther Dames, MD, <Dictator> Mellody Life MD ELECTRONICALLY SIGNED 10/06/2013 16:37

## 2015-04-22 NOTE — Discharge Summary (Signed)
PATIENT NAME:  Evan [name], Evan [name] MR#:  [id number] DATE OF BIRTH:  08/17/1945  DATE OF ADMISSION:  10/03/2013 DATE OF DISCHARGE:  10/05/2013  PRESENTING COMPLAINT: Dark stools.   DISCHARGE DIAGNOSES: 1. Gastrointestinal bleed, source remains unknown at this time.  2. Hypertension.   EGD showed normal esophagus, erythematous and friable, nodular mucosa in the gastric fundus biopsied. Results pending. Normal duodenum.   CODE STATUS: FULL CODE.   MEDICATIONS: 1. Pravastatin 40 mg at bedtime.  2. Lumigan 0.01% 1 drop to affected eye daily.  3. Dorzolamide-timolol 2%/0.5% 1 drop to affected eyes b.i.d.  4. Fluticasone 50 mcg nasal spray as needed.  5. Centrum Silver p.o. daily.  6. Fish oil 1200 mg p.o. b.i.d.  7. Vitamin D3 at 2000 international units p.o. daily.  8. Ferrous sulfate 325 mg p.o. daily.  9. Stool softener daily.  10. Amlodipine 5 mg daily.  11. Glucosamine 1500 mg daily.  12. Acetaminophen 325 two tablets 4 hourly as needed.  13. Tramadol 50 mg b.i.d. as needed.  14. Protonix 40 mg daily.   DIET: Mechanical soft diet.   FOLLOWUP:  1. Follow up with Dr. Thurmond Butts in 1 week.  2. Follow up with Dr. Raechel Ache in 2 to 4 weeks.   LABORATORY DATA: Hemoglobin at discharge was 15.1. Comprehensive metabolic panel within normal limits. EKG normal sinus rhythm. UA negative for UTI.   GASTROINTESTINAL CONSULTATION: Dr. Thurmond Butts.  PROCEDURE: Upper GI endoscopy, results as above.   BRIEF SUMMARY OF HOSPITAL COURSE: [name] is 70 year old African American gentleman with history of hypertension and hyperlipidemia and history of diverticulosis, comes in with:  1. Melena/black, tarry stools along with mild burgundy stools in the setting of increased use of NSAIDs for back pain recently. The patient was admitted for GI bleed, likely upper. His H and H  remained stable. He was started on Protonix drip. He did not have any bright red blood per rectum and was not even passing any blood clots. He had  a history of significant diverticular bleed requiring 9 units of blood transfusion last year. Given his recent use of NSAIDs he went for EGD per Dr. Thurmond Butts, results as above were noted. The patient otherwise remained hemodynamically stable. Dr. Thurmond Butts recommends video capsule endoscopy, which will be done as outpatient. The patient was started on mechanical soft diet prior to discharge.  2. History of diverticulosis, stable. No maroon or bright red blood per rectum was noted.  3. Benign hypertension, on amlodipine.  4. Borderline diabetes, diet-controlled.  5. History of iron deficiency anemia. H and H is stable.   Hospital stay otherwise remained stable. The patient remained a FULL CODE.   TIME SPENT: 40 minutes.   ____________________________ Hart Rochester Posey Pronto, MD sap:sg D: 10/06/2013 07:51:00 ET T: 10/06/2013 08:53:14 ET JOB#: [id number]  cc: Onix Jumper A. Posey Pronto, MD, <Dictator> Ilda Basset MD ELECTRONICALLY SIGNED 10/23/2013 14:58

## 2015-04-23 NOTE — Discharge Summary (Signed)
PATIENT NAME:  Evan [name], Evan [name] MR#:  [id number] DATE OF BIRTH:  March 25, 1945  DATE OF ADMISSION:  03/24/2014 DATE OF DISCHARGE:  03/27/2014   ADMITTING DIAGNOSIS: Degenerative arthrosis of the right knee.   DISCHARGE DIAGNOSIS: Degenerative arthrosis of the right knee.   OPERATION: On 03/24/2014, the patient had right total knee arthroplasty using computer-aided navigation.   SURGEON: Laurice Record. Holley Bouche., MD  ASSISTANT: Vance Peper, PA   ANESTHESIA: Spinal.   ESTIMATED BLOOD LOSS: 50 mL.  TOURNIQUET TIME: 97 minutes.   IMPLANTS USED: DePuy PFC Sigma size 5 posterior stabilized femoral component that was cemented, size 6 MBT tibial component (cemented), 41 mm 3 peg oval dome patella (cemented), and a 15 mm stabilized rotating platform polyethylene insert.   The patient was stabilized and brought to the recovery room and then brought down to the orthopedic floor.   HISTORY: The patient is a 70 year old male who presented for an upcoming total knee replacement on the right knee. The patient has been refractory to conservative treatment with cortisone injections, activity modification and anti-inflammatories. The patient has continued to have significant pain and could not tolerate the anti-inflammatories because of colitis.   PHYSICAL EXAMINATION: GENERAL: The patient is a well-developed, well-nourished male who has antalgic gait involving the right lower extremity.  LUNGS: Clear to auscultation.  CARDIAC: Normal rate and rhythm.  MUSCULOSKELETAL: In regard to the right lower extremity, the patient has no real swelling. The patient has positive crepitus. The patient has medial joint line tenderness. The patient has full extension to 130 degrees flexion with pain. The patient is neurologically intact.   IMAGING: The patient had x-rays revealing tricompartmental degenerative joint disease predominantly on the medial side.   HOSPITAL COURSE: After initial admission on 03/24/2014, the patient  had a hemoglobin that was 14.2, and on postoperative day 2, it was up to 14.4. The patient's labs remained stable. The patient was ambulating, initially bed to chair, and progressed up to ambulating 400 feet with a rolling walker and was able to do activities to the bathroom with no difficulty and was ready to go home with home health physical therapy on 03/27/2014.   CONDITION AT DISCHARGE: Stable.   DISPOSITION: The patient was sent home.   DISCHARGE INSTRUCTIONS:  1. The patient will follow up with [facility] on 04/08/2014.  2. The patient is to do weight-bear as tolerated on the affected leg. The patient will continue using his walker for ambulation.  3. The patient will use TED hose on both legs, removed at bedtime and replaced in the morning. 4. The patient will try to elevate his heels off the bed.  5. The patient will use an incentive spirometer and be encouraged to do cough and deep breathing.  6. The patient will use a regular diet.  7. The patient is to use Polar Care to decrease swelling.  8. The patient will try not to get his dressing wet or dirty. The patient will have the dressing changed as needed with physical therapy.  9. The patient will call the clinic if there is any bright red bleeding, calf pain, bowel or bladder difficulty or any fever greater than 101.5.  10. The patient will do home health physical therapy, doing gait training and range of motion activities.   DISCHARGE MEDICATIONS: To resume home medication and to add oxycodone 5 mg 1 tablet every 4 hours as needed for severe pain, Celebrex 200 mg 1 capsule b.i.d., Lovenox 40 mg subcutaneous once a  day for 14 days, then discontinue and begin aspirin 81 mg once a day. The patient will continue using his tramadol 50 mg 1 tablet every 4 hours as needed for mild to moderate pain, which he has at home.   ____________________________ J. Reche Dixon, [place] jtm:lb D: 03/27/2014 06:25:36 ET T: 03/27/2014  11:04:25 ET JOB#: [id number]  cc: J. Reche Dixon, [place], <Dictator> J Tarris Delb[facility] PA ELECTRONICALLY SIGNED 04/14/2014 6:37

## 2015-04-23 NOTE — Op Note (Signed)
PATIENT NAME:  Evan [name], Evan [name] MR#:  [id number] DATE OF BIRTH:  [birth year]  DATE OF PROCEDURE:  03/24/2014  PREOPERATIVE DIAGNOSIS: Degenerative arthrosis of the right knee.   POSTOPERATIVE DIAGNOSIS: Degenerative arthrosis of the right knee.   PROCEDURE PERFORMED: Right total knee arthroplasty using computer-assisted navigation.   SURGEON: Laurice Record. Holley Bouche., MD  ASSISTANT: Vance Peper, PA (required to maintain retraction throughout the procedure).   ANESTHESIA: Spinal.   ESTIMATED BLOOD LOSS: 50 mL.   FLUIDS REPLACED: 1700 mL of crystalloid.   TOURNIQUET TIME: 97 minutes.   DRAINS: Two medium drains to reinfusion system.   SOFT TISSUE RELEASES: Anterior cruciate ligament, posterior cruciate ligament, deep medial collateral ligament and patellofemoral ligament.   IMPLANTS UTILIZED: DePuy PFC Sigma size 5 posterior stabilized femoral component (cemented), size 6 MBT tibial component (cemented), a 41 mm 3 peg oval dome patella (cemented), and a 15 mm stabilized rotating platform polyethylene insert.   INDICATIONS FOR SURGERY: The patient is a 70 year old male who has been seen for complaints of progressive bilateral knee pain, with the right knee more symptomatic than the left. X-rays demonstrated significant degenerative changes with relative varus deformity. After discussion of the risks and benefits of surgical intervention, the patient expressed understanding of the risks and benefits and agreed with plans for surgical intervention.   PROCEDURE IN DETAIL: The patient was brought into the operating room and, after adequate spinal anesthesia was achieved, a tourniquet was placed on the patient's upper right thigh. The patient's right knee and leg were cleaned and prepped with alcohol and DuraPrep and draped in the usual sterile fashion. A "timeout" was performed as per usual protocol. The right lower extremity was exsanguinated using an Esmarch, and the tourniquet was inflated to 300  mmHg. An anterior longitudinal incision was made, followed by a standard mid vastus approach. A moderate effusion was evacuated. The deep fibers of the medial collateral ligament were elevated in a subperiosteal fashion off the medial flare of the tibia so as to maintain a continuous soft tissue sleeve. The patella was subluxed laterally, and the patellofemoral ligament was incised. Inspection of the knee demonstrated significant degenerative changes of both the medial and patellofemoral compartments. Prominent osteophytes were debrided using a rongeur. Anterior and posterior cruciate ligaments were excised. Two 4.0 mm Schanz pins were inserted into the femur and into the tibia for attachment of the array of trackers used for computer-assisted navigation. Hip center was identified using the circumduction technique. Distal landmarks were mapped using the computer. The distal femur and proximal tibia were mapped using the computer. Distal femoral cutting guide was positioned using computer-assisted navigation so as to achieve a 5 degree distal valgus cut. The cut was performed and verified using the computer. Distal femur was sized, and it was felt that a size 5 femoral component was appropriate. A size 5 cutting guide was positioned, and an anterior cut was performed and verified using the computer. This was followed by completion of the posterior and chamfer cuts. Femoral cutting guide for the central box was then positioned, and the central box cut was performed. Attention was then directed to the proximal tibia. Medial and lateral menisci were excised. The extramedullary tibial cutting guide was positioned using computer-assisted navigation so as to achieve a 0 degree varus and valgus alignment and a 0 degree posterior slope. The cut was performed and verified using the computer. The proximal tibia was sized, and it was felt that a size 6 tibial tray was appropriate. Tibial and  femoral trials were inserted,  followed by insertion of first a 10 and then subsequently a 12.5 and eventually a 15 mm polyethylene trial. This allowed for excellent medial and lateral soft tissue balancing both in full extension and in flexion. Finally, the patella was cut and prepared so as to accommodate a 41 mm 3 peg oval dome patella. Patellar trial was placed, and the knee was placed through a range of motion, with excellent patellar tracking appreciated.   Femoral component was removed after debridement of posterior osteophytes. A central post hole for the tibial component was reamed, followed by insertion of a keel punch. Tibial trial was then removed. Cut surfaces of bone were irrigated with copious amounts of normal saline with antibiotic solution using pulsatile lavage and then suctioned dry. Polymethyl methacrylate cement was prepared in the usual fashion using a vacuum mixer. Cement was applied to the cut surface of the proximal tibia as well as along the undersurface of a size 6 MBT tibial component. The tibial component was positioned and impacted into place. Excess cement was removed using [employer] fast streamer. Cement was then applied to the cut surface of the femur as well as along the posterior flanges of a size 5 posterior stabilized femoral component. The femoral component was positioned and impacted into place. Excess cement was removed using [employer] fast streamer. A 15 mm polyethylene trial was inserted, and the knee was brought into full extension with steady axial compression applied. Finally, cement was applied to the backside of a 41 mm 3 peg oval dome patella, and the patellar component was positioned and patellar clamp applied. Excess cement was removed using [employer] fast streamer.   After adequate curing of the cement, the tourniquet was deflated after a total tourniquet time of 97 minutes. Hemostasis was achieved using electrocautery. The knee was irrigated with copious amounts of normal saline with antibiotic solution using  pulsatile lavage and then suctioned dry. The knee was inspected for any residual cement debris. Then, 20 mL of 1.3% Exparel and 40 mL of normal saline was injected along the posterior capsule, medial and lateral gutters, and along the arthrotomy site. A 15 mm stabilized rotating platform polyethylene insert was inserted, and the knee was placed through range of motion. Again, excellent medial and lateral soft tissue balancing was appreciated both in full extension and in flexion. Excellent patellar tracking was appreciated. Two medium drains were placed in the wound bed and brought out through a separate stab incision so as to be attached to reinfusion system. The medial parapatellar portion of the incision was reapproximated using interrupted sutures of #1 Vicryl. The subcutaneous tissue was approximated in layers using first #0 Vicryl, followed by #2-0 Vicryl. Then, 30 mL of 0.25% Marcaine with epinephrine was injected into the subcutaneous tissue in line with the incision. Skin was then reapproximated using skin staples. A sterile dressing was applied.   The patient tolerated the procedure well. He was transported to the recovery room in stable condition.    ____________________________ Laurice Record. Holley Bouche., MD jph:lb D: 03/24/2014 11:26:29 ET T: 03/24/2014 11:43:47 ET JOB#: [id number]  cc: Jeneen Rinks P. Holley Bouche., MD, <Dictator> JAMES P Holley Bouche MD ELECTRONICALLY SIGNED 03/24/2014 20:07

## 2015-04-24 NOTE — Consult Note (Signed)
History of Present Illness:   Reason for Consult I was asked to see this patient in consultation at the request of Dr. Dossie Arbour for evaluation of degenerative disc disease of the lumbar spine.    HPI   Evan [name] is a very pleasant 70 year old gentleman who presented to neurosurgery initially in 2011 when he developed osteomyelitis biopsy confirmed following a cholecystectomy and was treated with 56 days of antibiotics.  He initially said that his legs felt weak and he had severe back pain.  He was evaluated by Dr. Kathie Dike at that time who did not feel that he had any focal neurologic compromise or symptoms felt to be amenable to surgical exploration.  His symptoms ultimately resolved completely until approximately 1 month ago he started having pain in his bilateral lower extremities.  He denies any back pain.  He states that the pain feels like a "soreness" and is in both legs and he also has some numbness.  He denies any bowel or bladder compromise.  He denies any symptoms suggestive of a radicular type pain.  He also does not feel that the pain is improved with hunching over as would be most typical for neurogenic claudication.  He felt that initially both legs were equally affected, but the right leg is back to normal and now his left leg is more bothersome.  He feels that he has had some relief of his symptoms since starting physical therapy and working with the pain clinic.  PFSH:   Additional Past Medical and Surgical History Patient is s/p ablative therapy for atrial fibrillation at [facility].  He denies having a pacemaker placed and is not sure why he could not have an MRI. s/p cholecystectomy as above s/p treatment for osteomyelitis.   Review of Systems:   General denies complaints    GI no complaints    GU no complaints    Extremities no complaints   NURSING NOTES: *CC Vital Signs Flowsheet:   20-Mar-13 13:56    Temp Temperature: 96.8    Pulse Pulse: 74    Respirations Respirations:  20    SBP SBP: 149    DBP DBP: 98    Current Weight (kg) (kg): 127.8    Height (cm) centimeters: 193    BSA (m2): 2.5   Physical Exam:   Neuro: AAOx3  cranial nerves intact    Psych: normal appearance  alert and cooperative    Physical Exam Patient is awake, alert, oriented with appropriate speech and fluent conversation. Pupils are equal, EOMI.   Face is symmetric. In his lower extremities, his strength is diffusely 4/5 on the L LE in his IP, Quad, DF, and PF and 5/5 on the R in all muscle groups.  Sensation intact to light touch.  Ambulates with a cane. Negative straight leg raise.     Diabetes:    Hypertension:    Hypercholesterolemia:    Gall Bladder: removal 05-18-10   Penicillin: Hives    Duragesic-12: 1 PATCH Transdermal every 72 hours x 30 days , Active, 10, None    04-Mar-13 11:09, CT Lumbar Spine With Contrast    06-Mar-13 12:44, [id number]    06-Mar-13 14:26, [id number]   Assessment and Plan:  Impression:   70 year old gentleman with degenerative disc disease and history of osteomyelitis now with recurrent lower extremity symptoms.  His symptoms sound most suggestive of a neurogenic claudication/spinal stenosis type clinical picture.  He denies any radicular type pain, progressive weakness, or bowel/bladder issues.  Given that he has had some improvement with conservative therapy, I feel that this would be beneficial.  He does have degenerative changes noted on his most recent imaging, however he did not have an MRI because of concern that he may have a pacemaker.  The patient strongly denies having a pacemaker or being told that he could not have a repeat MRI.  discussed with him that our options for his degenerative disease would be to continue with a trial of conservative management or to consider surgical intervention which would likely be in the form of a decompressive laminectomy/ies which may require additional stabilization in the form of an instrumented  fusion.  He is hesitant to consider surgery at this time.  I feel that this is appropriate.  I did counsel him that if he were to develop acute lower extremity weakness or bowel/bladder issues, he should report to the emergency room.  If his symptoms continue to slowly decline rather than improve, we could work to schedule an MRI and see him back sooner. also discussed with him the concern that he may have an underlying ongoing chronic type infectious process.  I feel that this is less likely given that he is no longer taking antibiotics and does not endorse systemic symptoms of infection and denies any back pain.  I stated that the only way to know for certain would be to try to obtain tissue through a biopsy, though I would not recommend it at this time with the clinical paucity of symptoms suggestive of infection.  Plan:   I will see him back in 8-12 weeks with an MRI of the lumbar spine to see how he is progressing to determine whether or not surgical intervention would be indicated.  Electronic Signatures: Sasaki-Adams, Alinda Deem (MD)  (Signed 20-Mar-13 14:58)  Authored: HISTORY OF PRESENT ILLNESS, PFSH, ROS, NURSING NOTES, PE, PAST MEDICAL HISTORY, ALLERGIES, HOME MEDICATIONS, OTHER RESULTS, ASSESSMENT AND PLAN   Last Updated: 20-Mar-13 14:58 by Ramon Dredge (MD)

## 2015-04-24 NOTE — Consult Note (Signed)
History of Present Illness:   HPI   I am seeing Evan [name] in follow up for evaluation of bilateral lower extremity symptoms and history of osteomyelitis.Evan [name] is a very pleasant 70 year old gentleman with a history of osteomyelitis in 2011 when he presented with bilateral lower extremity weakness and was treated with 56 days of antibiotics.  He then presented in 02/2012 with complaints of bilateral lower extremity soreness.  I met with him at that time when he felt that his symptoms were improving.  As we had no imaging, I felt that he would benefit from a follow up MRI and continued conservative therapy as he was getting benefit with physical therapy and had an appointment with the pain team.says that since I last saw him, his symptoms have resolved.  He goes to the gym for several hours every day.  He denies any lower extremity weakness, parasthesias, or bowel/bladder issues.  He denies any back pain.  He no longer is taking any narcotics and has no follow up with the pain team.  Physical Exam:   Physical Exam Awake, alert, oriented with fluent speech. Eyes open. Strength 5/5 bilateral IP, Q, PF, DF. Sensation intact to light touch. Negative straight leg raise.    Penicillin: Hives  Radiology Results: MRI:    29-May-13 10:43, MRI Lumbar Spine Without Contrast   MRI Lumbar Spine Without Contrast    REASON FOR EXAM:    degenerative spine  COMMENTS:       PROCEDURE: MR  - MR LUMBAR SPINE WO CONTRAST  - May 28 2012 10:43AM     RESULT: Comparison: 07/26/2010    Technique: Standard lumbar spine protocol, without administration of IV   contrast.     Findings:   There has been significant interval decrease and bone marrow edema about   the vertebral bodies and intervertebral disc space of L3 and L4. There is   only a trace amount of residual edema along the periphery of the   endplates. The intervertebral disc space is now collapsed, with likely     partial osseous fusion of the L3-L4  intervertebral disc space. There are   minimal bone marrow endplate reactive changes around the L4-L5   intervertebral disc space. Minimal posterior disc bulge seen at T11-T12   on the sagittal images only.    T12-L1: Minimal posterior disc bulge causes minimal flattening of the   ventral thecal sac. No neuroforaminal narrowing. There is mild   degenerative facet disease.    L1-L2: Mild posterior disc bulge and mild degenerative facet disease.   There is mild bilateral neuroforaminal narrowing. There is a T2   hyperintense structure extending from the left facet joint which likely   represents an intraspinal synovial cyst. This causes moderate mass effect   on the posterior thecal sac. Overall, there is moderate canal stenosis at   this level.  L2-L3: Mild posterior disc bulge causes flattening of the thecal sac.   There is mild degenerative disease and mild left neuroforaminal   narrowing. Overall, there is mild canal stenosis.    L3-L4: Mild posterior disc osteophyte complex, eccentric to the left   causes flattening of the thecal sac. There is moderate right, and severe   left, neuroforaminal narrowing. The left far lateral disc osteophyte   complex abuts the exiting left L3 nerve root. Overall, there is mild   canal stenosis.    L4-L5: Mild posterior disc bulge, eccentric to the right, causes   flattening of the  thecal sac. There is moderate right, and mild left,   degenerative facet disease. The right far lateral disc material impresses   on the exiting right L4 nerve root. There is moderate bilateral   neuroforaminal narrowing. Overall, there is mild canal stenosis.  L5-S1: Mild posterior disc osteophyte causes mild flattening of the   ventral thecal sac. No neuroforaminal narrowing. There is mild   degenerative facet disease.    IMPRESSION:     1. Multilevel degenerative disc and facet disease, with multilevel   bilateral neuroforaminal narrowing, as described above level  by level.   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   This is new from prior.  2. Findings which likely represent evolution and essentially resolution   of the changes of discitis osteomyelitis at L3-L4. There is likely at   least partial osseous fusion at this level now.      Verified By: Gregor Hams, M.D., MD   Assessment and Plan:  Impression:   Evan [name] is doing well.  He has no complaints referrable to his degnerative changes in his spine.  His imaging with some element of spinal stenosis, though without symptoms, I agree to follow this conservatively.    Plan:   Patient will follow up on a prn basis.  Electronic Signatures: Ramon Dredge (MD)  (Signed 05-Jun-13 13:59)  Authored: HISTORY OF PRESENT ILLNESS, PE, ALLERGIES, OTHER RESULTS, ASSESSMENT AND PLAN   Last Updated: 05-Jun-13 13:59 by Ramon Dredge (MD)

## 2015-05-04 DIAGNOSIS — G4730 Sleep apnea, unspecified: Secondary | ICD-10-CM | POA: Insufficient documentation

## 2015-05-04 DIAGNOSIS — Z79899 Other long term (current) drug therapy: Secondary | ICD-10-CM | POA: Insufficient documentation

## 2016-05-03 DIAGNOSIS — J449 Chronic obstructive pulmonary disease, unspecified: Secondary | ICD-10-CM | POA: Insufficient documentation

## 2016-07-09 ENCOUNTER — Encounter: Payer: Medicare Other | Attending: Internal Medicine | Admitting: *Deleted

## 2016-07-09 ENCOUNTER — Encounter: Payer: Self-pay | Admitting: *Deleted

## 2016-07-09 VITALS — BP 150/82 | Ht 76.0 in | Wt 310.9 lb

## 2016-07-09 DIAGNOSIS — E1329 Other specified diabetes mellitus with other diabetic kidney complication: Secondary | ICD-10-CM | POA: Diagnosis not present

## 2016-07-09 DIAGNOSIS — E785 Hyperlipidemia, unspecified: Secondary | ICD-10-CM | POA: Insufficient documentation

## 2016-07-09 DIAGNOSIS — E119 Type 2 diabetes mellitus without complications: Secondary | ICD-10-CM

## 2016-07-09 DIAGNOSIS — Z6837 Body mass index (BMI) 37.0-37.9, adult: Secondary | ICD-10-CM | POA: Insufficient documentation

## 2016-07-09 DIAGNOSIS — E669 Obesity, unspecified: Secondary | ICD-10-CM | POA: Diagnosis not present

## 2016-07-09 NOTE — Patient Instructions (Addendum)
Check blood sugars 2 x day before breakfast and 2 hrs after supper 3-4 x week  Exercise: Continue exercise program  for   120  minutes 5 days a week  Eat 3 meals day,  1-2  snacks a day Space meals 4-6 hours apart Limit desserts/sweets Add 1 protein serving with breakfast Decrease serving sizes of fruit  Call back if you want to schedule classes or an appointment with a dietitian

## 2016-07-09 NOTE — Progress Notes (Signed)
Diabetes Self-Management Education  Visit Type: First/Initial  Appt. Start Time: 0905 Appt. End Time: 1010  07/09/2016  Evan [name], identified by name and date of birth, is a 71 y.o. male with a diagnosis of Diabetes: Type 2.   ASSESSMENT  Blood pressure 150/82, height 6\' 4"  (1.93 m), weight 310 lb 14.4 oz (141.023 kg). Body mass index is 37.86 kg/(m^2).      Diabetes Self-Management Education - 07/09/16 1033    Visit Information   Visit Type First/Initial   Initial Visit   Diabetes Type Type 2   Are you currently following a meal plan? Yes   What type of meal plan do you follow? "decreased sugary items"   Are you taking your medications as prescribed? Yes   Date Diagnosed Pt reports 05/03/16 but labs from [facility] show diabetes diagnosis on 11/08/14 with A1C of 7.5 %   Psychosocial Assessment   Patient Belief/Attitude about Diabetes Motivated to manage diabetes   Self-care barriers Hard of hearing   Self-management support Family;Church   Patient Concerns Nutrition/Meal planning;Weight Control;Medication;Glycemic Control   Special Needs None   Preferred Learning Style Auditory   Learning Readiness Change in progress   How often do you need to have someone help you when you read instructions, pamphlets, or other written materials from your doctor or pharmacy? 1 - Never   What is the last grade level you completed in school? 12th   Pre-Education Assessment   Patient understands the diabetes disease and treatment process. Needs Instruction   Patient understands incorporating nutritional management into lifestyle. Needs Instruction   Patient undertands incorporating physical activity into lifestyle. Demonstrates understanding / competency   Patient understands using medications safely. Needs Instruction   Patient understands monitoring blood glucose, interpreting and using results Needs Review   Patient understands prevention, detection, and treatment of acute complications.  Needs Instruction   Patient understands prevention, detection, and treatment of chronic complications. Needs Review   Patient understands how to develop strategies to address psychosocial issues. Needs Instruction   Patient understands how to develop strategies to promote health/change behavior. Needs Instruction   Complications   Last HgB A1C per patient/outside source 8.5 %  05/03/16   How often do you check your blood sugar? 3-4 times / week   Fasting Blood glucose range (mg/dL) 130-179  Pt reports FBG's 130-140's mg/dL   Postprandial Blood glucose range (mg/dL) 70-129;130-179  Pt reports pp's 95-135 mg/dL   Have you had a dilated eye exam in the past 12 months? Yes   Have you had a dental exam in the past 12 months? Yes   Are you checking your feet? Yes   How many days per week are you checking your feet? 7   Dietary Intake   Breakfast oatmeal, large amounts of fruit (approx 4 servings)   Lunch fruit   Dinner chicken, steak, fish with sweet potatoes, muffins, turnip greens   Beverage(s) water, diet soda, Crystal lite   Exercise   Exercise Type Moderate (swimming / aerobic walking)   How many days per week to you exercise? 5   How many minutes per day do you exercise? 120   Total minutes per week of exercise 600   Patient Education   Previous Diabetes Education Yes (please comment)  church   Disease state  Definition of diabetes, type 1 and 2, and the diagnosis of diabetes   Nutrition management  Role of diet in the treatment of diabetes and the relationship between the  three main macronutrients and blood glucose level   Physical activity and exercise  Role of exercise on diabetes management, blood pressure control and cardiac health.   Medications Reviewed patients medication for diabetes, action, purpose, timing of dose and side effects.   Monitoring Purpose and frequency of SMBG.;Identified appropriate SMBG and/or A1C goals.   Chronic complications Relationship between chronic  complications and blood glucose control   Psychosocial adjustment Identified and addressed patients feelings and concerns about diabetes   Individualized Goals (developed by patient)   Reducing Risk Improve blood sugars Decrease medications Lose weight    Outcomes   Expected Outcomes Demonstrated interest in learning. Expect positive outcomes   Future DMSE PRN   Program Status Not Completed      Individualized Plan for Diabetes Self-Management Training:   Learning Objective:  Patient will have a greater understanding of diabetes self-management. Patient education plan is to attend individual and/or group sessions per assessed needs and concerns.   Plan:   Patient Instructions  Check blood sugars 2 x day before breakfast and 2 hrs after supper 3-4 x week Exercise: Continue exercise program  for   120  minutes 5 days a week Eat 3 meals day,  1-2  snacks a day Space meals 4-6 hours apart Limit desserts/sweets Add 1 protein serving with breakfast Decrease serving sizes of fruit Call back if you want to schedule classes or an appointment with a dietitian   Expected Outcomes:  Demonstrated interest in learning. Expect positive outcomes  Education material provided: General Meal Planning Guidelines Simple Meal Plan  If problems or questions, patient to contact team via:  Johny Drilling, RN, CCM, CDE [phone]  Future DSME appointment: PRN  Pt doesn't want to schedule follow up a this time. His next MD appointment is next month.

## 2016-12-27 ENCOUNTER — Inpatient Hospital Stay
Admission: EM | Admit: 2016-12-27 | Discharge: 2017-01-06 | DRG: 329 | Disposition: A | Discharge status: Home / Self Care | Payer: Medicare Other | Attending: Internal Medicine | Admitting: Internal Medicine

## 2016-12-27 ENCOUNTER — Inpatient Hospital Stay: Payer: Medicare Other

## 2016-12-27 DIAGNOSIS — D62 Acute posthemorrhagic anemia: Secondary | ICD-10-CM

## 2016-12-27 DIAGNOSIS — Z7984 Long term (current) use of oral hypoglycemic drugs: Secondary | ICD-10-CM | POA: Diagnosis not present

## 2016-12-27 DIAGNOSIS — I10 Essential (primary) hypertension: Secondary | ICD-10-CM | POA: Diagnosis present

## 2016-12-27 DIAGNOSIS — Z7982 Long term (current) use of aspirin: Secondary | ICD-10-CM | POA: Diagnosis not present

## 2016-12-27 DIAGNOSIS — Z87891 Personal history of nicotine dependence: Secondary | ICD-10-CM | POA: Diagnosis not present

## 2016-12-27 DIAGNOSIS — Z880 Allergy status to penicillin: Secondary | ICD-10-CM | POA: Diagnosis not present

## 2016-12-27 DIAGNOSIS — Z96651 Presence of right artificial knee joint: Secondary | ICD-10-CM | POA: Diagnosis present

## 2016-12-27 DIAGNOSIS — R55 Syncope and collapse: Secondary | ICD-10-CM | POA: Diagnosis not present

## 2016-12-27 DIAGNOSIS — R0902 Hypoxemia: Secondary | ICD-10-CM

## 2016-12-27 DIAGNOSIS — Z888 Allergy status to other drugs, medicaments and biological substances status: Secondary | ICD-10-CM | POA: Diagnosis not present

## 2016-12-27 DIAGNOSIS — N400 Enlarged prostate without lower urinary tract symptoms: Secondary | ICD-10-CM | POA: Diagnosis present

## 2016-12-27 DIAGNOSIS — K922 Gastrointestinal hemorrhage, unspecified: Secondary | ICD-10-CM

## 2016-12-27 DIAGNOSIS — Z881 Allergy status to other antibiotic agents status: Secondary | ICD-10-CM

## 2016-12-27 DIAGNOSIS — E785 Hyperlipidemia, unspecified: Secondary | ICD-10-CM | POA: Diagnosis present

## 2016-12-27 DIAGNOSIS — H409 Unspecified glaucoma: Secondary | ICD-10-CM | POA: Diagnosis present

## 2016-12-27 DIAGNOSIS — Z5189 Encounter for other specified aftercare: Secondary | ICD-10-CM

## 2016-12-27 DIAGNOSIS — R000 Tachycardia, unspecified: Secondary | ICD-10-CM | POA: Diagnosis not present

## 2016-12-27 DIAGNOSIS — E876 Hypokalemia: Secondary | ICD-10-CM

## 2016-12-27 DIAGNOSIS — Z79899 Other long term (current) drug therapy: Secondary | ICD-10-CM | POA: Diagnosis not present

## 2016-12-27 DIAGNOSIS — Z833 Family history of diabetes mellitus: Secondary | ICD-10-CM | POA: Diagnosis not present

## 2016-12-27 DIAGNOSIS — K625 Hemorrhage of anus and rectum: Secondary | ICD-10-CM | POA: Diagnosis present

## 2016-12-27 DIAGNOSIS — E119 Type 2 diabetes mellitus without complications: Secondary | ICD-10-CM | POA: Diagnosis present

## 2016-12-27 DIAGNOSIS — K5731 Diverticulosis of large intestine without perforation or abscess with bleeding: Secondary | ICD-10-CM | POA: Diagnosis present

## 2016-12-27 DIAGNOSIS — D72829 Elevated white blood cell count, unspecified: Secondary | ICD-10-CM

## 2016-12-27 DIAGNOSIS — R578 Other shock: Secondary | ICD-10-CM | POA: Diagnosis not present

## 2016-12-27 DIAGNOSIS — Z8719 Personal history of other diseases of the digestive system: Secondary | ICD-10-CM

## 2016-12-27 LAB — PREPARE RBC (CROSSMATCH)

## 2016-12-27 LAB — GLUCOSE, CAPILLARY
GLUCOSE-CAPILLARY: 155 mg/dL — AB (ref 65–99)
Glucose-Capillary: 222 mg/dL — ABNORMAL HIGH (ref 65–99)

## 2016-12-27 LAB — CBC
HCT: 45.5 % (ref 40.0–52.0)
HEMOGLOBIN: 14.9 g/dL (ref 13.0–18.0)
MCH: 27.7 pg (ref 26.0–34.0)
MCHC: 32.7 g/dL (ref 32.0–36.0)
MCV: 84.6 fL (ref 80.0–100.0)
Platelets: 281 10*3/uL (ref 150–440)
RBC: 5.38 MIL/uL (ref 4.40–5.90)
RDW: 14.6 % — ABNORMAL HIGH (ref 11.5–14.5)
WBC: 7.3 10*3/uL (ref 3.8–10.6)

## 2016-12-27 LAB — MRSA PCR SCREENING: MRSA BY PCR: NEGATIVE

## 2016-12-27 LAB — COMPREHENSIVE METABOLIC PANEL
ALK PHOS: 71 U/L (ref 38–126)
ALT: 21 U/L (ref 17–63)
ANION GAP: 7 (ref 5–15)
AST: 20 U/L (ref 15–41)
Albumin: 3.9 g/dL (ref 3.5–5.0)
BUN: 15 mg/dL (ref 6–20)
CALCIUM: 9.7 mg/dL (ref 8.9–10.3)
CHLORIDE: 102 mmol/L (ref 101–111)
CO2: 26 mmol/L (ref 22–32)
Creatinine, Ser: 0.94 mg/dL (ref 0.61–1.24)
GFR calc non Af Amer: >60 mL/min (ref >60)
Glucose, Bld: 263 mg/dL — ABNORMAL HIGH (ref 65–99)
Potassium: 4.1 mmol/L (ref 3.5–5.1)
SODIUM: 135 mmol/L (ref 135–145)
Total Bilirubin: 1.1 mg/dL (ref 0.3–1.2)
Total Protein: 7.1 g/dL (ref 6.5–8.1)

## 2016-12-27 LAB — ABO/RH: ABO/RH(D): B POS

## 2016-12-27 LAB — HEMOGLOBIN
Hemoglobin: 12 g/dL — ABNORMAL LOW (ref 13.0–18.0)
Hemoglobin: 13 g/dL (ref 13.0–18.0)

## 2016-12-27 LAB — TROPONIN I: Troponin I: <0.03 ng/mL (ref <0.03)

## 2016-12-27 MED ORDER — ONDANSETRON HCL 4 MG PO TABS
4.0000 mg | ORAL_TABLET | Freq: Four times a day (QID) | ORAL | Status: DC | PRN
Start: 2016-12-27 — End: 2017-01-06

## 2016-12-27 MED ORDER — TECHNETIUM TC 99M-LABELED RED BLOOD CELLS IV KIT
19.0170 | PACK | Freq: Once | INTRAVENOUS | Status: AC | PRN
  Administered 2016-12-27: 19.017 via INTRAVENOUS

## 2016-12-27 MED ORDER — SODIUM CHLORIDE 0.9 % IV SOLN: Freq: Once | INTRAVENOUS | Status: DC

## 2016-12-27 MED ORDER — SODIUM CHLORIDE 0.9 % IV SOLN
INTRAVENOUS | Status: DC
  Administered 2016-12-27 – 2016-12-28 (×2): via INTRAVENOUS

## 2016-12-27 MED ORDER — PANTOPRAZOLE SODIUM 40 MG IV SOLR
40.0000 mg | Freq: Once | INTRAVENOUS | Status: AC
  Administered 2016-12-27: 40 mg via INTRAVENOUS
  Filled 2016-12-27: qty 40

## 2016-12-27 MED ORDER — SODIUM CHLORIDE 0.9 % IV SOLN
8.0000 mg/h | INTRAVENOUS | Status: DC
  Administered 2016-12-27 – 2016-12-28 (×2): 8 mg/h via INTRAVENOUS
  Filled 2016-12-27 (×3): qty 80

## 2016-12-27 MED ORDER — ONDANSETRON HCL 4 MG/2ML IJ SOLN: 4.0000 mg | Freq: Four times a day (QID) | INTRAMUSCULAR | Status: DC | PRN

## 2016-12-27 MED ORDER — INSULIN ASPART 100 UNIT/ML ~~LOC~~ SOLN
0.0000 [IU] | Freq: Three times a day (TID) | SUBCUTANEOUS | Status: DC
  Administered 2016-12-28: 5 [IU] via SUBCUTANEOUS
  Administered 2016-12-28: 2 [IU] via SUBCUTANEOUS
  Filled 2016-12-27: qty 2

## 2016-12-27 MED ORDER — SODIUM CHLORIDE 0.9 % IV BOLUS (SEPSIS)
1000.0000 mL | Freq: Once | INTRAVENOUS | Status: AC
  Administered 2016-12-27: 1000 mL via INTRAVENOUS

## 2016-12-27 MED ORDER — PANTOPRAZOLE SODIUM 40 MG IV SOLR: 40.0000 mg | Freq: Two times a day (BID) | INTRAVENOUS | Status: DC

## 2016-12-27 MED ORDER — SODIUM CHLORIDE 0.9% FLUSH
3.0000 mL | Freq: Two times a day (BID) | INTRAVENOUS | Status: DC
  Administered 2016-12-27 – 2017-01-06 (×16): 3 mL via INTRAVENOUS

## 2016-12-27 NOTE — ED Triage Notes (Signed)
Pt c/o bloody loose stool x2 today with a hx of gi bleeding with diverticulitis. Denies any abd pain.[name]

## 2016-12-27 NOTE — ED Notes (Signed)
Second unit of emergency blood returned to blood bank.

## 2016-12-27 NOTE — ED Notes (Signed)
Patient returned from [facility]. Hooked up to monitor and vitals taken. VSS. NAD noted. No needs expressed at this time. Will continue to monitor.

## 2016-12-27 NOTE — Progress Notes (Addendum)
Bleeding scan positive for bleeding in sigmoid colon. Discussed case with Dr. Gertha Calkin regarding embolization. Patient will have procedure later.

## 2016-12-27 NOTE — ED Notes (Signed)
Patient given dinner tray and drink

## 2016-12-27 NOTE — ED Notes (Signed)
CAllewd intgo patient's room.  Patient c/o "feeling hot and sweaty".  Patient pale, non verbal.  BP 73/57  HR 74.  Drs. Archie Balboa and Sudini alerted.  1 L NS hung and infusing.  2 nits emergency blood ordered and picked up from Blood bank.

## 2016-12-27 NOTE — ED Notes (Addendum)
Patient transported to nuclear medicine 

## 2016-12-27 NOTE — ED Provider Notes (Signed)
[facility] Emergency Department Provider Note   ____________________________________________   I have reviewed the triage vital signs and the nursing notes.   HISTORY  Chief Complaint GI Bleeding   History limited by: Not Limited   HPI Evan [name] is a 71 y.o. male who presents to the emergency department today because of concern for GI bleed. The patient has a history of diverticular bleed in the past requiring transfusion. Patient had normal bowel movements yesterday and again this morning. However shortly after the normal bowel movement the patient felt like he had to defecate again. At that time the patient had a bloody movement. He subsequently had two further bloody bowel movements. The patient started to feel light headed during my examination and did pass out briefly.     Past Medical History:  Diagnosis Date  . Atrial fibrillation ([place])   . BPH (benign prostatic hyperplasia)   . CHF (congestive heart failure) ([place])   . Diabetes mellitus without complication ([place])   . Diverticulosis   . Hyperlipidemia   . Hypertension   . Sleep apnea     There are no active problems to display for this patient.   Past Surgical History:  Procedure Laterality Date  . ATRIAL ABLATION SURGERY    . CHOLECYSTECTOMY    . TOTAL KNEE ARTHROPLASTY Right     Prior to Admission medications   Medication Sig Start Date End Date Taking? Authorizing Provider  acetaminophen (TYLENOL) 500 MG tablet Take 1,000 mg by mouth every 6 (six) hours as needed.    Historical Provider, MD  amLODipine (NORVASC) 5 MG tablet Take 5 mg by mouth daily. 05/03/16   Historical Provider, MD  aspirin 81 MG tablet Take 81 mg by mouth daily. 06/12/10   Historical Provider, MD  Cholecalciferol (VITAMIN D3) 2000 units capsule Take 2,000 Units by mouth daily.    Historical Provider, MD  docusate calcium (SURFAK) 240 MG capsule Take 1 capsule by mouth daily.    Historical Provider, MD   dorzolamide-timolol (COSOPT) 22.3-6.8 MG/ML ophthalmic solution Apply 1 drop to eye 2 (two) times daily.    Historical Provider, MD  latanoprost (XALATAN) 0.005 % ophthalmic solution Apply 1 drop to eye at bedtime.    Historical Provider, MD  metFORMIN (GLUCOPHAGE) 500 MG tablet Take 500 mg by mouth 2 (two) times daily. 05/14/16 05/14/17  Historical Provider, MD  Multiple Vitamins-Minerals (MULTIVITAMIN ADULT PO) Take 1 tablet by mouth daily.    Historical Provider, MD  Omega-3 Fatty Acids (FISH OIL) 1200 MG CAPS Take 2 capsules by mouth daily.    Historical Provider, MD  pravastatin (PRAVACHOL) 40 MG tablet Take 40 mg by mouth daily. 05/03/16   Historical Provider, MD    Allergies Azithromycin; Penicillins; Lisinopril; Losartan; and Metoprolol  Family History  Problem Relation Age of Onset  . Diabetes Sister   . Diabetes Brother   . Diabetes Sister   . Diabetes Brother     Social History Social History  Substance Use Topics  . Smoking status: Former Smoker    Packs/day: 0.25    Years: 29.00    Types: Cigarettes    Quit date: 12/31/1993  . Smokeless tobacco: Never Used  . Alcohol use No    Review of Systems  Constitutional: Negative for fever. Cardiovascular: Negative for chest pain. Respiratory: Negative for shortness of breath. Gastrointestinal: Positive for GI bleed.  Genitourinary: Negative for dysuria. Musculoskeletal: Negative for back pain. Skin: Negative for rash. Neurological: Negative for headaches, focal weakness  or numbness.  10-point ROS otherwise negative.  ____________________________________________   PHYSICAL EXAM:  VITAL SIGNS: ED Triage Vitals  Enc Vitals Group     BP 12/27/16 0951 (!) 117/97     Pulse Rate 12/27/16 0951 (!) 58     Resp 12/27/16 0951 20     Temp 12/27/16 0951 98.3 F (36.8 C)     Temp Source 12/27/16 0951 Oral     SpO2 12/27/16 0951 98 %     Weight 12/27/16 0949 (!) 303 lb (137.4 kg)     Height 12/27/16 0949 6\' 4"  (1.93 m)    Constitutional: Alert and oriented. Well appearing and in no distress. Eyes: Conjunctivae are normal. Normal extraocular movements. ENT   Head: Normocephalic and atraumatic.   Nose: No congestion/rhinnorhea.   Mouth/Throat: Mucous membranes are moist.   Neck: No stridor. Hematological/Lymphatic/Immunilogical: No cervical lymphadenopathy. Cardiovascular: Normal rate, regular rhythm.  No murmurs, rubs, or gallops.  Respiratory: Normal respiratory effort without tachypnea nor retractions. Breath sounds are clear and equal bilaterally. No wheezes/rales/rhonchi. Gastrointestinal: Soft and non tender. No rebound. No guarding.  Genitourinary: Deferred Musculoskeletal: Normal range of motion in all extremities. No lower extremity edema. Neurologic:  Normal speech and language. No gross focal neurologic deficits are appreciated.  Skin:  Skin is warm, dry and intact. No rash noted. Psychiatric: Mood and affect are normal. Speech and behavior are normal. Patient exhibits appropriate insight and judgment.  ____________________________________________    LABS (pertinent positives/negatives)  Labs Reviewed  COMPREHENSIVE METABOLIC PANEL - Abnormal; Notable for the following:       Result Value   Glucose, Bld 263 (*)    All other components within normal limits  CBC - Abnormal; Notable for the following:    RDW 14.6 (*)    All other components within normal limits  TROPONIN I  POC OCCULT BLOOD, ED  TYPE AND SCREEN     ____________________________________________   EKG  I, Nance Pear, attending physician, personally viewed and interpreted this EKG  EKG Time: 1100 Rate: 93 Rhythm: normal sinus rhythm Axis: normal Intervals: qtc 398 QRS: narrow ST changes: no st elevation, t wave inversion V5, V6 Impression: abnormal ekg   ____________________________________________     RADIOLOGY  None  ____________________________________________   PROCEDURES  Procedures  ____________________________________________   INITIAL IMPRESSION / ASSESSMENT AND PLAN / ED COURSE  Pertinent labs & imaging results that were available during my care of the patient were reviewed by me and considered in my medical decision making (see chart for details).  Patient presented to the emergency department today because of concerns for GI bleed. Patient has a history of diverticulosis with GI bleed requiring transfusion in the past. On exam. Heart rate and blood pressure within normal limits. The patient did have 1 bloody bowel movement for the nurse here in the emergency department. Given history of significant bleed in the past will place on protonix and admit to the hospitalist service.   ____________________________________________   FINAL CLINICAL IMPRESSION(S) / ED DIAGNOSES  Final diagnoses:  Gastrointestinal hemorrhage, unspecified gastrointestinal hemorrhage type     Note: This dictation was prepared with Dragon dictation. Any transcriptional errors that result from this process are unintentional     Nance Pear, MD 12/27/16 1122

## 2016-12-27 NOTE — ED Notes (Addendum)
Patient passe large amount of dark blood in toilet. Small clots noted. MD made aware. Will continue to monitor.

## 2016-12-27 NOTE — H&P (Signed)
[place] [facility] [place] NAME: Evan [name]    MR#:  [id number]  DATE OF BIRTH:  March 22, 1945  DATE OF ADMISSION:  12/27/2016  PRIMARY CARE PHYSICIAN: Ezequiel Kayser, MD   REQUESTING/REFERRING PHYSICIAN: Dr. Archie Balboa  CHIEF COMPLAINT:   Chief Complaint  Patient presents with  . GI Bleeding    HISTORY OF PRESENT ILLNESS:  Evan [name]  is a 71 y.o. male with a known history of Diverticular bleed needing vascular embolization 2013, upper GI bleed due to NSAID's in 2015 presents to the emergency room with 2 episodes of bright red blood per rectum. Patient had 2 more large volume bloody stools in the emergency room. Had one episode of lightheadedness and presyncope. After I saw the patient and admitting orders were placed for stepdown with a stat bleeding scan patient's blood pressure dropped to 73/52. He got cold and clammy. No further bleeding episodes. Hemoglobin today is 14.9. He takes baby aspirin at home.  PAST MEDICAL HISTORY:   Past Medical History:  Diagnosis Date  . Atrial fibrillation ([place])   . BPH (benign prostatic hyperplasia)   . CHF (congestive heart failure) ([place])   . Diabetes mellitus without complication ([place])   . Diverticulosis   . Hyperlipidemia   . Hypertension   . Sleep apnea     PAST SURGICAL HISTORY:   Past Surgical History:  Procedure Laterality Date  . ATRIAL ABLATION SURGERY    . CHOLECYSTECTOMY    . TOTAL KNEE ARTHROPLASTY Right     SOCIAL HISTORY:   Social History  Substance Use Topics  . Smoking status: Former Smoker    Packs/day: 0.25    Years: 29.00    Types: Cigarettes    Quit date: 12/31/1993  . Smokeless tobacco: Never Used  . Alcohol use No    FAMILY HISTORY:   Family History  Problem Relation Age of Onset  . Diabetes Sister   . Diabetes Brother   . Diabetes Sister   . Diabetes Brother     DRUG ALLERGIES:   Allergies  Allergen Reactions  . Azithromycin Rash  . Penicillins Rash     Has patient had a PCN reaction causing immediate rash, facial/tongue/throat swelling, SOB or lightheadedness with hypotension: Yes Has patient had a PCN reaction causing severe rash involving mucus membranes or skin necrosis: Yes Has patient had a PCN reaction that required hospitalization No Has patient had a PCN reaction occurring within the last 10 years: No If all of the above answers are "NO", then may proceed with Cephalosporin use.   [name] Lisinopril Cough  . Losartan Cough  . Metoprolol Other (See Comments)    Bradycardia.    REVIEW OF SYSTEMS:   Review of Systems  Constitutional: Positive for malaise/fatigue. Negative for chills and fever.  HENT: Negative for sore throat.   Eyes: Negative for blurred vision, double vision and pain.  Respiratory: Negative for cough, hemoptysis, shortness of breath and wheezing.   Cardiovascular: Negative for chest pain, palpitations, orthopnea and leg swelling.  Gastrointestinal: Positive for blood in stool. Negative for abdominal pain, constipation, diarrhea, heartburn, nausea and vomiting.  Genitourinary: Negative for dysuria and hematuria.  Musculoskeletal: Negative for back pain and joint pain.  Skin: Negative for rash.  Neurological: Positive for dizziness and weakness. Negative for sensory change, speech change, focal weakness and headaches.  Endo/Heme/Allergies: Does not bruise/bleed easily.  Psychiatric/Behavioral: Negative for depression. The patient is not nervous/anxious.     MEDICATIONS AT HOME:  Prior to Admission medications   Medication Sig Start Date End Date Taking? Authorizing Provider  amLODipine (NORVASC) 5 MG tablet Take 5 mg by mouth daily. 05/03/16  Yes Historical Provider, MD  aspirin 81 MG tablet Take 81 mg by mouth daily. 06/12/10  Yes Historical Provider, MD  Cholecalciferol (VITAMIN D3) 2000 units capsule Take 2,000 Units by mouth daily.   Yes Historical Provider, MD  docusate calcium (SURFAK) 240 MG capsule Take  1 capsule by mouth daily.   Yes Historical Provider, MD  dorzolamide-timolol (COSOPT) 22.3-6.8 MG/ML ophthalmic solution Apply 1 drop to eye 2 (two) times daily.   Yes Historical Provider, MD  latanoprost (XALATAN) 0.005 % ophthalmic solution Apply 1 drop to eye at bedtime.   Yes Historical Provider, MD  metFORMIN (GLUCOPHAGE) 1000 MG tablet Take 1,000 mg by mouth daily with breakfast.  05/14/16 05/14/17 Yes Historical Provider, MD  Multiple Vitamins-Minerals (MULTIVITAMIN ADULT PO) Take 1 tablet by mouth daily.   Yes Historical Provider, MD  Omega-3 Fatty Acids (FISH OIL) 1200 MG CAPS Take 2 capsules by mouth daily.   Yes Historical Provider, MD  pravastatin (PRAVACHOL) 40 MG tablet Take 40 mg by mouth daily. 05/03/16  Yes Historical Provider, MD  acetaminophen (TYLENOL) 500 MG tablet Take 1,000 mg by mouth every 6 (six) hours as needed.    Historical Provider, MD     VITAL SIGNS:  Blood pressure (!) 117/97, pulse (!) 58, temperature 98.3 F (36.8 C), temperature source Oral, resp. rate 20, height 6\' 4"  (1.93 m), weight (!) 137.4 kg (303 lb), SpO2 98 %.  PHYSICAL EXAMINATION:  Physical Exam  GENERAL:  71 y.o.-year-old patient lying in the bed. Obese. Clammy EYES: Pupils equal, round, reactive to light and accommodation. No scleral icterus. Extraocular muscles intact.  HEENT: Head atraumatic, normocephalic. Oropharynx and nasopharynx clear. No oropharyngeal erythema, moist oral mucosa  NECK:  Supple, no jugular venous distention. No thyroid enlargement, no tenderness.  LUNGS: Normal breath sounds bilaterally, no wheezing, rales, rhonchi. No use of accessory muscles of respiration.  CARDIOVASCULAR: S1, S2 normal. No murmurs, rubs, or gallops.  ABDOMEN: Soft, nontender, nondistended. Bowel sounds present. No organomegaly or mass.  EXTREMITIES: No pedal edema, cyanosis, or clubbing. + 2 pedal & radial pulses b/l.   NEUROLOGIC: Cranial nerves II through XII are intact. No focal Motor or sensory  deficits appreciated b/l PSYCHIATRIC: The patient is alert and oriented x 3. Good affect.  SKIN: No obvious rash, lesion, or ulcer.   LABORATORY PANEL:   CBC  Recent Labs Lab 12/27/16 1004  WBC 7.3  HGB 14.9  HCT 45.5  PLT 281   ------------------------------------------------------------------------------------------------------------------  Chemistries   Recent Labs Lab 12/27/16 1004  NA 135  K 4.1  CL 102  CO2 26  GLUCOSE 263*  BUN 15  CREATININE 0.94  CALCIUM 9.7  AST 20  ALT 21  ALKPHOS 71  BILITOT 1.1   ------------------------------------------------------------------------------------------------------------------  Cardiac Enzymes  Recent Labs Lab 12/27/16 1004  TROPONINI <0.03   ------------------------------------------------------------------------------------------------------------------  RADIOLOGY:  No results found.   IMPRESSION AND PLAN:   * Bright red blood per rectum with 4 episodes of large volume blood Patient is hypotensive and symptomatic. We will bolus 1 L normal saline stat. Call blood bank. Although his hemoglobin is at 14 suspect this has dropped significantly. Due to shock from blood loss we'll transfuse 1 unit packed RBC and hold 2 more units. Stat bleeding scan. Protonix drip. Discussed with Dr. Allen Norris of GI. Admit to ICU. Serial  hemoglobin checks. Nothing by mouth. Patient is critically ill. Hold aspirin from home.  * Hypertension Hold medications due to hypotension  * Diabetes mellitus type 2 Sliding scale insulin  All the records are reviewed and case discussed with ED provider. Management plans discussed with the patient, family and they are in agreement.  CODE STATUS: FULL CODE  TOTAL CC TIME TAKING CARE OF THIS PATIENT: 45 minutes.   Hillary Bow R M.D on 12/27/2016 at 12:05 PM  Between 7am to 6pm - Pager - [phone]  After 6pm go to [email or web address] - password [email or web address] [facility]   Office  [phone]  CC: Primary care physician; Ezequiel Kayser, MD  Note: This dictation was prepared with Dragon dictation along with smaller phrase technology. Any transcriptional errors that result from this process are unintentional.

## 2016-12-28 ENCOUNTER — Encounter
Admission: EM | Disposition: A | Discharge status: Home / Self Care | Payer: Self-pay | Source: Home / Self Care | Attending: Internal Medicine

## 2016-12-28 DIAGNOSIS — K922 Gastrointestinal hemorrhage, unspecified: Secondary | ICD-10-CM

## 2016-12-28 DIAGNOSIS — I10 Essential (primary) hypertension: Secondary | ICD-10-CM

## 2016-12-28 DIAGNOSIS — Z8719 Personal history of other diseases of the digestive system: Secondary | ICD-10-CM

## 2016-12-28 HISTORY — PX: PERIPHERAL VASCULAR CATHETERIZATION: SHX172C

## 2016-12-28 LAB — GLUCOSE, CAPILLARY
GLUCOSE-CAPILLARY: 174 mg/dL — AB (ref 65–99)
GLUCOSE-CAPILLARY: 305 mg/dL — AB (ref 65–99)
GLUCOSE-CAPILLARY: 282 mg/dL — AB (ref 65–99)
Glucose-Capillary: 179 mg/dL — ABNORMAL HIGH (ref 65–99)
Glucose-Capillary: 287 mg/dL — ABNORMAL HIGH (ref 65–99)
Glucose-Capillary: 288 mg/dL — ABNORMAL HIGH (ref 65–99)

## 2016-12-28 LAB — PREPARE RBC (CROSSMATCH)

## 2016-12-28 LAB — CBC WITH DIFFERENTIAL/PLATELET
BASOS PCT: 0 %
Basophils Absolute: 0 10*3/uL (ref 0–0.1)
EOS ABS: 0 10*3/uL (ref 0–0.7)
EOS PCT: 0 %
HCT: 24.2 % — ABNORMAL LOW (ref 40.0–52.0)
HEMOGLOBIN: 7.8 g/dL — AB (ref 13.0–18.0)
LYMPHS ABS: 2.9 10*3/uL (ref 1.0–3.6)
Lymphocytes Relative: 17 %
MCH: 27.9 pg (ref 26.0–34.0)
MCHC: 32.2 g/dL (ref 32.0–36.0)
MCV: 86.5 fL (ref 80.0–100.0)
MONO ABS: 1.3 10*3/uL — AB (ref 0.2–1.0)
MONOS PCT: 8 %
NEUTROS PCT: 75 %
Neutro Abs: 12.4 10*3/uL — ABNORMAL HIGH (ref 1.4–6.5)
PLATELETS: 184 10*3/uL (ref 150–440)
RBC: 2.8 MIL/uL — ABNORMAL LOW (ref 4.40–5.90)
RDW: 14.3 % (ref 11.5–14.5)
WBC: 16.6 10*3/uL — ABNORMAL HIGH (ref 3.8–10.6)

## 2016-12-28 LAB — BASIC METABOLIC PANEL
Anion gap: 4 — ABNORMAL LOW (ref 5–15)
BUN: 16 mg/dL (ref 6–20)
CHLORIDE: 110 mmol/L (ref 101–111)
CO2: 25 mmol/L (ref 22–32)
CREATININE: 0.85 mg/dL (ref 0.61–1.24)
Calcium: 8.9 mg/dL (ref 8.9–10.3)
GFR calc Af Amer: >60 mL/min (ref >60)
GFR calc non Af Amer: >60 mL/min (ref >60)
GLUCOSE: 222 mg/dL — AB (ref 65–99)
POTASSIUM: 4.4 mmol/L (ref 3.5–5.1)
Sodium: 139 mmol/L (ref 135–145)

## 2016-12-28 LAB — LACTIC ACID, PLASMA: Lactic Acid, Venous: 4.2 mmol/L (ref 0.5–1.9)

## 2016-12-28 LAB — PROTIME-INR
INR: 1.24
PROTHROMBIN TIME: 15.7 s — AB (ref 11.4–15.2)

## 2016-12-28 LAB — CBC
HEMATOCRIT: 32.5 % — AB (ref 40.0–52.0)
Hemoglobin: 10.9 g/dL — ABNORMAL LOW (ref 13.0–18.0)
MCH: 28.6 pg (ref 26.0–34.0)
MCHC: 33.6 g/dL (ref 32.0–36.0)
MCV: 85 fL (ref 80.0–100.0)
Platelets: 199 10*3/uL (ref 150–440)
RBC: 3.82 MIL/uL — ABNORMAL LOW (ref 4.40–5.90)
RDW: 14.4 % (ref 11.5–14.5)
WBC: 14.2 10*3/uL — ABNORMAL HIGH (ref 3.8–10.6)

## 2016-12-28 LAB — APTT: aPTT: 25 seconds (ref 24–36)

## 2016-12-28 LAB — AMMONIA: Ammonia: 46 umol/L — ABNORMAL HIGH (ref 9–35)

## 2016-12-28 SURGERY — VISCERAL ARTERY INTERVENTION
Anesthesia: Moderate Sedation

## 2016-12-28 MED ORDER — OMEGA-3-ACID ETHYL ESTERS 1 G PO CAPS
1.0000 g | ORAL_CAPSULE | Freq: Every day | ORAL | Status: DC
  Administered 2016-12-29 – 2017-01-06 (×8): 1 g via ORAL
  Filled 2016-12-28 (×8): qty 1

## 2016-12-28 MED ORDER — SODIUM CHLORIDE 0.9 % IV SOLN: INTRAVENOUS | Status: DC

## 2016-12-28 MED ORDER — FENTANYL CITRATE (PF) 100 MCG/2ML IJ SOLN
INTRAMUSCULAR | Status: AC
  Filled 2016-12-28: qty 2

## 2016-12-28 MED ORDER — MIDAZOLAM HCL 2 MG/2ML IJ SOLN
INTRAMUSCULAR | Status: AC
  Filled 2016-12-28: qty 4

## 2016-12-28 MED ORDER — INSULIN ASPART 100 UNIT/ML ~~LOC~~ SOLN
0.0000 [IU] | Freq: Three times a day (TID) | SUBCUTANEOUS | Status: DC
  Administered 2016-12-28: 8 [IU] via SUBCUTANEOUS
  Administered 2016-12-29 (×2): 2 [IU] via SUBCUTANEOUS
  Administered 2016-12-29: 3 [IU] via SUBCUTANEOUS
  Administered 2016-12-30 (×2): 2 [IU] via SUBCUTANEOUS
  Administered 2016-12-31 (×2): 3 [IU] via SUBCUTANEOUS
  Filled 2016-12-28 (×2): qty 2
  Filled 2016-12-28: qty 3
  Filled 2016-12-28: qty 8
  Filled 2016-12-28: qty 3
  Filled 2016-12-28: qty 2
  Filled 2016-12-28: qty 3
  Filled 2016-12-28: qty 2

## 2016-12-28 MED ORDER — FENTANYL CITRATE (PF) 100 MCG/2ML IJ SOLN
INTRAMUSCULAR | Status: DC | PRN
  Administered 2016-12-28 (×2): 50 ug via INTRAVENOUS

## 2016-12-28 MED ORDER — CLINDAMYCIN PHOSPHATE 300 MG/50ML IV SOLN: 300.0000 mg | Freq: Once | INTRAVENOUS | Status: DC

## 2016-12-28 MED ORDER — IOPAMIDOL (ISOVUE-300) INJECTION 61%
INTRAVENOUS | Status: DC | PRN
  Administered 2016-12-28: 40 mL via INTRA_ARTERIAL

## 2016-12-28 MED ORDER — LIDOCAINE HCL (PF) 1 % IJ SOLN
INTRAMUSCULAR | Status: AC
  Filled 2016-12-28: qty 30

## 2016-12-28 MED ORDER — PRAVASTATIN SODIUM 40 MG PO TABS
40.0000 mg | ORAL_TABLET | Freq: Every day | ORAL | Status: DC
  Administered 2016-12-29 – 2017-01-06 (×8): 40 mg via ORAL
  Filled 2016-12-28 (×6): qty 1
  Filled 2016-12-28 (×2): qty 2

## 2016-12-28 MED ORDER — CLINDAMYCIN PHOSPHATE 300 MG/50ML IV SOLN
INTRAVENOUS | Status: AC
  Administered 2016-12-28: 300 mg
  Filled 2016-12-28: qty 50

## 2016-12-28 MED ORDER — SODIUM CHLORIDE 0.9 % IV BOLUS (SEPSIS)
1000.0000 mL | Freq: Once | INTRAVENOUS | Status: AC
  Administered 2016-12-28: 1000 mL via INTRAVENOUS

## 2016-12-28 MED ORDER — HEPARIN (PORCINE) IN NACL 2-0.9 UNIT/ML-% IJ SOLN
INTRAMUSCULAR | Status: AC
  Filled 2016-12-28: qty 1000

## 2016-12-28 MED ORDER — ADULT MULTIVITAMIN W/MINERALS CH
1.0000 | ORAL_TABLET | Freq: Every day | ORAL | Status: DC
  Administered 2016-12-29 – 2017-01-06 (×8): 1 via ORAL
  Filled 2016-12-28 (×8): qty 1

## 2016-12-28 MED ORDER — SODIUM CHLORIDE 0.9 % IV SOLN
Freq: Once | INTRAVENOUS | Status: AC
  Administered 2016-12-28: 18:00:00 via INTRAVENOUS

## 2016-12-28 MED ORDER — VITAMIN D 1000 UNITS PO TABS
2000.0000 [IU] | ORAL_TABLET | Freq: Every day | ORAL | Status: DC
  Administered 2016-12-29 – 2017-01-06 (×8): 2000 [IU] via ORAL
  Filled 2016-12-28 (×8): qty 2

## 2016-12-28 MED ORDER — LATANOPROST 0.005 % OP SOLN
1.0000 [drp] | Freq: Every day | OPHTHALMIC | Status: DC
Start: 2016-12-28 — End: 2017-01-06
  Administered 2016-12-29 – 2017-01-05 (×9): 1 [drp] via OPHTHALMIC
  Filled 2016-12-28 (×3): qty 2.5

## 2016-12-28 MED ORDER — MIDAZOLAM HCL 2 MG/2ML IJ SOLN
INTRAMUSCULAR | Status: DC | PRN
  Administered 2016-12-28: 2 mg via INTRAVENOUS
  Administered 2016-12-28: 1 mg via INTRAVENOUS

## 2016-12-28 MED ORDER — DORZOLAMIDE HCL-TIMOLOL MAL 2-0.5 % OP SOLN
1.0000 [drp] | Freq: Two times a day (BID) | OPHTHALMIC | Status: DC
Start: 2016-12-28 — End: 2017-01-06
  Administered 2016-12-28 – 2017-01-06 (×16): 1 [drp] via OPHTHALMIC
  Filled 2016-12-28 (×2): qty 10

## 2016-12-28 SURGICAL SUPPLY — 13 items
BLOCK BEAD 300-500 MIC (Vascular Products) ×3 IMPLANT
CATH 5FR REUT (CATHETERS) ×3 IMPLANT
CATH PIG 70CM (CATHETERS) ×3 IMPLANT
DEVICE STARCLOSE SE CLOSURE (Vascular Products) ×3 IMPLANT
GLIDECATH 4FR STR (CATHETERS) ×3 IMPLANT
GUIDEWIRE ANGLED .035 180CM (WIRE) ×3 IMPLANT
PACK ANGIOGRAPHY (CUSTOM PROCEDURE TRAY) ×3 IMPLANT
SHEATH BRITE TIP 5FRX11 (SHEATH) ×3 IMPLANT
SHIELD RADPAD SCOOP 12X17 (MISCELLANEOUS) ×3 IMPLANT
SYR MEDRAD MARK V 150ML (SYRINGE) ×3 IMPLANT
TOWEL OR 17X26 4PK STRL BLUE (TOWEL DISPOSABLE) ×3 IMPLANT
TUBING CONTRAST HIGH PRESS 72 (TUBING) ×3 IMPLANT
WIRE EMERALD 3MM-J .035X150CM (WIRE) ×3 IMPLANT

## 2016-12-28 NOTE — Consult Note (Signed)
[facility] Vascular Consult Note  MRN : [id number]  Evan [name] is a 71 y.o. (July 29, 1945) male who presents with chief complaint of  Chief Complaint  Patient presents with  . GI Bleeding  .  History of Present Illness: Patient presents with brisk lower GI bleeding. The started abruptly yesterday. There is no pain. There were no clear inciting events or causative factors. He denies trauma or injury. The had low blood pressure on admission, but no further hypotension or significant tachycardia. He has no fever or chills. As part of his workup, bleeding scan was performed which I have independently reviewed. This demonstrates bleeding and what appears to be the sigmoid colon.  Current Facility-Administered Medications  Medication Dose Route Frequency Provider Last Rate Last Dose  . [MAR Hold] 0.9 %  sodium chloride infusion   Intravenous Once Srikar Sudini, MD      . 0.9 %  sodium chloride infusion   Intravenous Continuous Hillary Bow, MD 75 mL/hr at 12/28/16 [id number]    . [MAR Hold] insulin aspart (novoLOG) injection 0-9 Units  0-9 Units Subcutaneous TID WC Srikar Sudini, MD      . Doug Sou Hold] ondansetron (ZOFRAN) tablet 4 mg  4 mg Oral Q6H PRN Hillary Bow, MD       Or  . Doug Sou Hold] ondansetron (ZOFRAN) injection 4 mg  4 mg Intravenous Q6H PRN Srikar Sudini, MD      . pantoprazole (PROTONIX) 80 mg in sodium chloride 0.9 % 250 mL (0.32 mg/mL) infusion  8 mg/hr Intravenous Continuous Nance Pear, MD 25 mL/hr at 12/28/16 0000 8 mg/hr at 12/28/16 0000  . [MAR Hold] sodium chloride flush (NS) 0.9 % injection 3 mL  3 mL Intravenous Q12H Hillary Bow, MD   3 mL at 12/27/16 2207    Past Medical History:  Diagnosis Date  . Atrial fibrillation ([place])   . BPH (benign prostatic hyperplasia)   . CHF (congestive heart failure) ([place])   . Diabetes mellitus without complication ([place])   . Diverticulosis   . Hyperlipidemia   . Hypertension   . Sleep apnea     Past  Surgical History:  Procedure Laterality Date  . ATRIAL ABLATION SURGERY    . CHOLECYSTECTOMY    . TOTAL KNEE ARTHROPLASTY Right     Social History Social History  Substance Use Topics  . Smoking status: Former Smoker    Packs/day: 0.25    Years: 29.00    Types: Cigarettes    Quit date: 12/31/1993  . Smokeless tobacco: Never Used  . Alcohol use No  Married, lives with wife  Family History Family History  Problem Relation Age of Onset  . Diabetes Sister   . Diabetes Brother   . Diabetes Sister   . Diabetes Brother   No bleeding or clotting disorders  Allergies  Allergen Reactions  . Azithromycin Rash  . Penicillins Rash    Has patient had a PCN reaction causing immediate rash, facial/tongue/throat swelling, SOB or lightheadedness with hypotension: Yes Has patient had a PCN reaction causing severe rash involving mucus membranes or skin necrosis: Yes Has patient had a PCN reaction that required hospitalization No Has patient had a PCN reaction occurring within the last 10 years: No If all of the above answers are "NO", then may proceed with Cephalosporin use.   [name] Lisinopril Cough  . Losartan Cough  . Metoprolol Other (See Comments)    Bradycardia.     REVIEW OF SYSTEMS (Negative unless checked)  Constitutional: [] Weight loss  [] Fever  [] Chills Cardiac: [] Chest pain   [] Chest pressure   [x] Palpitations   [] Shortness of breath when laying flat   [] Shortness of breath at rest   [] Shortness of breath with exertion. Vascular:  [] Pain in legs with walking   [] Pain in legs at rest   [] Pain in legs when laying flat   [] Claudication   [] Pain in feet when walking  [] Pain in feet at rest  [] Pain in feet when laying flat   [] History of DVT   [] Phlebitis   [] Swelling in legs   [] Varicose veins   [] Non-healing ulcers Pulmonary:   [] Uses home oxygen   [] Productive cough   [] Hemoptysis   [] Wheeze  [] COPD   [] Asthma Neurologic:  [] Dizziness  [] Blackouts   [] Seizures   [] History of stroke    [] History of TIA  [] Aphasia   [] Temporary blindness   [] Dysphagia   [] Weakness or numbness in arms   [] Weakness or numbness in legs Musculoskeletal:  [] Arthritis   [] Joint swelling   [] Joint pain   [] Low back pain Hematologic:  [] Easy bruising  [] Easy bleeding   [] Hypercoagulable state   [] Anemic  [] Hepatitis Gastrointestinal:  [x] Blood in stool   [] Vomiting blood  [] Gastroesophageal reflux/heartburn   [] Difficulty swallowing. Genitourinary:  [] Chronic kidney disease   [] Difficult urination  [] Frequent urination  [] Burning with urination   [] Blood in urine Skin:  [] Rashes   [] Ulcers   [] Wounds Psychological:  [] History of anxiety   []  History of major depression.  Physical Examination  Vitals:   12/28/16 0400 12/28/16 0500 12/28/16 0600 12/28/16 0734  BP: 114/71 125/69 115/71 115/71  Pulse: 93 86 99 95  Resp: (!) 21 19 (!) 24 20  Temp:  98.4 F (36.9 C)  98.4 F (36.9 C)  TempSrc:  Oral  Oral  SpO2: 96% (!) 89% 96% 96%  Weight:  132.4 kg (291 lb 14.2 oz)  132 kg (291 lb)  Height:    6\' 4"  (1.93 m)   Body mass index is 35.42 kg/m. Gen:  WD/WN, NAD Head: [place]/AT, No temporalis wasting. Prominent temp pulse not noted. Ear/Nose/Throat: Hearing grossly intact, nares w/o erythema or drainage, oropharynx w/o Erythema/Exudate Eyes: Sclera non-icteric, conjunctiva clear Neck: Trachea midline.  No JVD.  Pulmonary:  Good air movement, respirations not labored, equal bilaterally.  Cardiac: RRR, normal S1, S2. Vascular:  Vessel Right Left  Radial Palpable Palpable                                   Gastrointestinal: soft, non-tender/non-distended. Obese Musculoskeletal: M/S 5/5 throughout.  Extremities without ischemic changes.  No deformity or atrophy. No edema. Neurologic: Sensation grossly intact in extremities.  Symmetrical.  Speech is fluent. Motor exam as listed above. Psychiatric: Judgment intact, Mood & affect appropriate for pt's clinical situation. Dermatologic: No rashes  or ulcers noted.  No cellulitis or open wounds. Lymph : No Cervical, Axillary, or Inguinal lymphadenopathy.      CBC Lab Results  Component Value Date   WBC 14.2 (H) 12/28/2016   HGB 10.9 (L) 12/28/2016   HCT 32.5 (L) 12/28/2016   MCV 85.0 12/28/2016   PLT 199 12/28/2016    BMET    Component Value Date/Time   NA 139 12/28/2016 0343   NA 136 03/26/2014 0418   K 4.4 12/28/2016 0343   K 4.0 03/26/2014 0418   CL 110 12/28/2016 0343   CL 102 03/26/2014  0418   CO2 25 12/28/2016 0343   CO2 34 (H) 03/26/2014 0418   GLUCOSE 222 (H) 12/28/2016 0343   GLUCOSE 114 (H) 03/26/2014 0418   BUN 16 12/28/2016 0343   BUN 8 03/26/2014 0418   CREATININE 0.85 12/28/2016 0343   CREATININE 0.90 03/26/2014 0418   CALCIUM 8.9 12/28/2016 0343   CALCIUM 9.2 03/26/2014 0418   GFRNONAA >60 12/28/2016 0343   GFRNONAA >60 03/26/2014 0418   GFRAA >60 12/28/2016 0343   GFRAA >60 03/26/2014 0418   Estimated Creatinine Clearance: 116.6 mL/min (by C-G formula based on SCr of 0.85 mg/dL).  COAG Lab Results  Component Value Date   INR 1.1 03/10/2014   INR 1.0 10/03/2013   INR 1.1 08/21/2012    Radiology Nm Gi Blood Loss  Result Date: 12/27/2016 CLINICAL DATA:  GI bleed. EXAM: NUCLEAR MEDICINE GASTROINTESTINAL BLEEDING SCAN TECHNIQUE: Sequential abdominal images were obtained following intravenous administration of Tc-51m labeled red blood cells. RADIOPHARMACEUTICALS:  19.017 mCi Tc-66m in-vitro labeled red cells. COMPARISON:  None. FINDINGS: There is immediate abnormal activity in loops of bowel in the lower pelvis, probably in the sigmoid portion of the colon. IMPRESSION: Abnormal GI bleeding scan. The study is positive for bleeding in the distal colon. Electronically Signed   By: Lorriane Shire M.D.   On: 12/27/2016 16:04      Assessment/Plan 1. Lower GI bleed. Positive bleeding scan for the sigmoid/distal colon. Although he has had a previous embolization of the several years ago risk of  ischemia is not tremendously elevated making repeat embolization a good choice for treatment. Risks and benefits discussed. 2. Hypertension. blood pressure control important in reducing the progression of atherosclerotic disease. On appropriate oral medications. 3. Hyperlipidemia. lipid control important in reducing the progression of atherosclerotic disease.     Leotis Pain, MD  12/28/2016 7:43 AM    This note was created with Dragon medical transcription system.  Any error is purely unintentional

## 2016-12-28 NOTE — Progress Notes (Signed)
Pt has done well in recovery. Pt resting quietly. resp regular and unlabored. Color wnl.  Pulses strong. Right femoral dressing star closure, dry and intact. No drainage or bleeding noted. No hematoma noted. Report called to [facility] RN CCU given. All questions answered. Pt states he is pain free at this time. Pt readied to go to CCU via pt bed.

## 2016-12-28 NOTE — Progress Notes (Signed)
Procedure done, vitals remained stable throughout, pt tolerating well, awakens to voice. Will transfer pt to recovery and continue to monitor prior to transferring back to ccu.

## 2016-12-28 NOTE — Progress Notes (Signed)
Patient seen in the ICU. Feeling lightheaded and dizzy. Cold and clammy. Had about thousand mL of bloody bowel movement. Tachycardic into the 130s with blood pressures systolic in the 0000000. He mentions he feels the same like he did yesterday in the emergency room. No abdominal pain. No vomiting.  S1 and S2 heard Lungs clear to auscultation Abdomen soft and nontender  * Bright red blood per rectum.  Going bleeding in spite of microembolization. Discussed with Dr. Heath Lark of surgery who has seen the patient. Plan is for resuscitation with fluids and blood. I have ordered 1 L normal saline bolus and 2 units of packed RBC. Discussed blood bank and blood is ready. If patient continues to have bleeding or worsens he will have to go to the operating room for partial colectomy.  CC time spent 35 minutes

## 2016-12-28 NOTE — Progress Notes (Signed)
[facility] [place] NAME: Evan [name]    MR#:  [id number]  DATE OF BIRTH:  1945/10/05  SUBJECTIVE:   Evan [name] with bright red blood per rectum became hypotensive and presyncopal. Status post MicroBid embolization. Doing better. Mild tachycardia. Denies any pain. REVIEW OF SYSTEMS:   Review of Systems  Constitutional: Negative for chills, fever and weight loss.  HENT: Negative for ear discharge, ear pain and nosebleeds.   Eyes: Negative for blurred vision, pain and discharge.  Respiratory: Negative for sputum production, shortness of breath, wheezing and stridor.   Cardiovascular: Negative for chest pain, palpitations, orthopnea and PND.  Gastrointestinal: Positive for blood in stool. Negative for abdominal pain, diarrhea, nausea and vomiting.  Genitourinary: Negative for frequency and urgency.  Musculoskeletal: Negative for back pain and joint pain.  Neurological: Positive for weakness. Negative for sensory change, speech change and focal weakness.  Psychiatric/Behavioral: Negative for depression and hallucinations. The patient is not nervous/anxious.    Tolerating Diet: Soft diet Tolerating PT: Pending  DRUG ALLERGIES:   Allergies  Allergen Reactions  . Azithromycin Rash  . Penicillins Rash    Has patient had a PCN reaction causing immediate rash, facial/tongue/throat swelling, SOB or lightheadedness with hypotension: Yes Has patient had a PCN reaction causing severe rash involving mucus membranes or skin necrosis: Yes Has patient had a PCN reaction that required hospitalization No Has patient had a PCN reaction occurring within the last 10 years: No If all of the above answers are "NO", then may proceed with Cephalosporin use.   [name] Lisinopril Cough  . Losartan Cough  . Metoprolol Other (See Comments)    Bradycardia.    VITALS:  Blood pressure 140/90, pulse (!) 112, temperature 97.7 F (36.5 C), resp. rate 15, height 6'  4" (1.93 m), weight 132 kg (291 lb), SpO2 100 %.  PHYSICAL EXAMINATION:   Physical Exam  GENERAL:  71 y.o.-year-old patient lying in the bed with no acute distress. Obese EYES: Pupils equal, round, reactive to light and accommodation. No scleral icterus. Extraocular muscles intact.  HEENT: Head atraumatic, normocephalic. Oropharynx and nasopharynx clear.  NECK:  Supple, no jugular venous distention. No thyroid enlargement, no tenderness.  LUNGS: Normal breath sounds bilaterally, no wheezing, rales, rhonchi. No use of accessory muscles of respiration.  CARDIOVASCULAR: S1, S2 normal. No murmurs, rubs, or gallops. Tachycardia ABDOMEN: Soft, nontender, nondistended. Bowel sounds present. No organomegaly or mass.  EXTREMITIES: No cyanosis, clubbing or edema b/l.    NEUROLOGIC: Cranial nerves II through XII are intact. No focal Motor or sensory deficits b/l.   PSYCHIATRIC:  patient is alert and oriented x 3.  SKIN: No obvious rash, lesion, or ulcer.   LABORATORY PANEL:  CBC  Recent Labs Lab 12/28/16 0343  WBC 14.2*  HGB 10.9*  HCT 32.5*  PLT 199    Chemistries   Recent Labs Lab 12/27/16 1004 12/28/16 0343  NA 135 139  K 4.1 4.4  CL 102 110  CO2 26 25  GLUCOSE 263* 222*  BUN 15 16  CREATININE 0.94 0.85  CALCIUM 9.7 8.9  AST 20  --   ALT 21  --   ALKPHOS 71  --   BILITOT 1.1  --    Cardiac Enzymes  Recent Labs Lab 12/27/16 1004  TROPONINI <0.03   RADIOLOGY:  Nm Gi Blood Loss  Result Date: 12/27/2016 CLINICAL DATA:  GI bleed. EXAM: NUCLEAR MEDICINE GASTROINTESTINAL BLEEDING SCAN TECHNIQUE: Sequential abdominal images were obtained  following intravenous administration of Tc-81m labeled red blood cells. RADIOPHARMACEUTICALS:  19.017 mCi Tc-35m in-vitro labeled red cells. COMPARISON:  None. FINDINGS: There is immediate abnormal activity in loops of bowel in the lower pelvis, probably in the sigmoid portion of the colon. IMPRESSION: Abnormal GI bleeding scan. The study  is positive for bleeding in the distal colon. Electronically Signed   By: Lorriane Shire M.D.   On: 12/27/2016 16:04   ASSESSMENT AND PLAN:   Evan [name]  is a 71 y.o. male with a known history of Diverticular bleed needing vascular embolization 2013, upper GI bleed due to NSAID's in 2015 presents to the emergency room with 2 episodes of bright red blood per rectum. Patient had 2 more large volume bloody stools in the emergency room. Had one episode of lightheadedness and presyncope.  * Bright red blood per rectum with 4 episodes of large volume blood Patient Was hypotensive and symptomatic.  -Received 1 unit of blood transfusion.  -Stat bleeding scan--was positive for GI bleed. Patient underwent micro-bead embolization around the sigmoid colon area DC Protonix drip. Hold aspirin from home.  * Hypertension Blood pressure stable  * Diabetes mellitus type 2 Sliding scale insulin  Patient has had few bloody stools which appears to be left over blood. He is hemodynamically otherwise stable mild tachycardia. Both move him to MedSurg floor with off unit telemetry  Case discussed with Care Management/Social Worker. Management plans discussed with the patient, family and they are in agreement.  CODE STATUS: Full  DVT Prophylaxis: SCD and teds TOTAL TIME TAKING CARE OF THIS PATIENT: 30 minutes.  >50% time spent on counselling and coordination of care  POSSIBLE D/C IN one to 2 DAYS, DEPENDING ON CLINICAL CONDITION.  Note: This dictation was prepared with Dragon dictation along with smaller phrase technology. Any transcriptional errors that result from this process are unintentional.  Kassidee Narciso M.D on 12/28/2016 at 2:15 PM  Between 7am to 6pm - Pager - [phone]  After 6pm go to [email or web address] - password [email or web address] [facility]  Office  [phone]  CC: Primary care physician; Ezequiel Kayser, MD

## 2016-12-28 NOTE — Progress Notes (Signed)
Pt here today with gi bleed, from icu 20, vitals stable, Dr Lucky Cowboy to do mesenteric angiogram. Will monitor patient's vitals as well as etco2 during entire procedure.

## 2016-12-28 NOTE — Op Note (Signed)
[facility] Percutaneous Study/Intervention Procedural Note     Surgeon(s): [employer]  Assistants: none  Pre-operative Diagnosis: 1. Lower GI bleed   Post-operative diagnosis: Same  Procedure(s) Performed: 1. Ultrasound guidance for vascular access right femoral artery 2. Catheter placement into sigmoidal branch of the inferior mesenteric artery from right femoral approach 3. Aortogram and selective angiogram of the IMA 4. Microbead embolization of approximately 1 cc of 300-500  polyvinyl alcohol beads to the IMA and the sigmoidal branch of the IMA. 5. StarClose closure device right femoral artery  Anesthesia: Moderate conscious sedation for approximately 20 minutes using 3 mg of Versed and 100 mcg of Fentanyl  EBL: Minimal  Fluoro Time: 4.4 minutes  Contrast: 40 cc  Indications: Patient is a  with brisk lower GI bleeding with resultant anemia. The patient has a nuclear medicine study showing positive for bleeding in the distal colon/sigmoidal: Area. The patient is brought in for angiography for further evaluation and potential treatment. Risks and benefits are discussed and informed consent is obtained  Procedure: The patient was identified and appropriate procedural time out was performed. The patient was then placed supine on the table and prepped and draped in the usual sterile fashion.Moderate conscious sedation was administered during a face to face encounter with the patient throughout the procedure with my supervision of the RN administering medicines and monitoring the patient's vital signs, pulse oximetry, telemetry and mental status throughout from the start of the procedure until the patient was taken to the recovery room.  Ultrasound was used to evaluate the right common femoral artery. It was patent . A digital ultrasound image was acquired. A  Seldinger needle was used to access the right common femoral artery under direct ultrasound guidance and a permanent image was performed. A 0.035 J wire was advanced without resistance and a 5Fr sheath was placed. Pigtail catheter was placed into the aorta and an AP aortogram was performed. This demonstrated what appeared to be normal renal arteries and a normal aorta and iliac segments although opacification was poor given his size and breathing patterns. We transitioned to the RAO projection to cannulate the IMA. A VS 1 catheter was used to selectively cannulate the IMA.  This demonstrated patent IMA. Possible blush in sigmoidal branch in the IMA although it was faint. Based on her continued bleeding and the nuclear medicine study I elected to treat this area with embolization. I advanced a Glidewire and removed the VS 1 catheter and then placed a 4 French glide catheter through the IMA and out into the secondary branch and to the sigmoidal branch and instilled approximately 0.5 cc of 300-500  polyvinyl alcohol beads in this location. Angiogram following this showed the main vessels to be open with less brisk filling. I then pulled back to the very origin of the sigmoidal branch off of the main IMA and deployed another 0.5 cc of 300-500  polyvinyl alcohol beads. Again, completion angiogram showed the main vessels to be open with less brisk filling. I elected to terminate the procedure. The diagnostic catheter was removed. StarClose closure device was deployed in usual fashion with excellent hemostatic result. The patient was taken to the recovery room in stable condition having tolerated the procedure well.     Findings: patent IMA. Possible blush in sigmoidal branch in the IMA although it was faint.  Disposition: Patient was taken to the recovery room in stable condition having tolerated the procedure well.  Complications: None  Leotis Pain 12/28/2016  8:15 AM   This note was created with  Dragon Medical transcription system. Any errors in dictation are purely unintentional.

## 2016-12-28 NOTE — Progress Notes (Addendum)
Returned from [employer]  at 0900. Alert and oriented.  Stable but heart rate in 130s. Dr. Posey Pronto notified Also has had  dark red stools x 2  Dr. Posey Pronto also aware..1530 Up to [facility] with help. Patient blacked out with snoring respirarions x 1 min. Before episode states he felt hot and dizzy. Dr. Posey Pronto notified. Also had 1072ml of bright red blood with clotts. Dr. Delana Meyer in at 1630- also notified of episode. CBC, Lactic acid and ammonia drawn per lab. Awaiting results to call Dr. Posey Pronto. 1730 Had 2nd episode of dizzy,hot and blacking out x30 seconds. Dr. Posey Pronto made aware of drop in patients hemaglobin. Plan- infuse 2 unitsRBCs and get surgical consult.

## 2016-12-28 NOTE — H&P (Signed)
Fox Salminen is an 71 y.o. male.   Chief Complaint: GI bleed HPI: 71 yr old male with past medical history of well-controlled diabetes and hypertension who comes in with a GI bleed.  Of note the patient had a GI bleed from the direct tracheal arm approximately 3 years ago he was embolized in that hospitalization and had a colonoscopy as well. The patient comes in again with bright red blood per rectum and a nuclear medicine study showed that he had bleeding in the sigmoid colon again and he received 1 unit of blood on admission. The patient was then taken for angiogram with embolization of the sigmoid colon more proximal branch of IMA with apparent decrease in bleeding on the angiogram. The patient was transferred back to the ICU and then a few hours later passed out and had about a liter of bright red blood passed around 4:30 to 5 PM. I was called by Dr. Fritzi Mandes around 5:30 this afternoon to come and evaluate the patient.    The patient states that he feels hot and a little bit dizzy but otherwise is having no abdominal pain. Patient has not had anymore blood past since that episode approximately an hour previously. They are hanging fluids now and waiting on the blood to come up.  Past Medical History:  Diagnosis Date  . Atrial fibrillation ([place])   . BPH (benign prostatic hyperplasia)   . CHF (congestive heart failure) ([place])   . Diabetes mellitus without complication ([place])   . Diverticulosis   . Hyperlipidemia   . Hypertension   . Sleep apnea     Past Surgical History:  Procedure Laterality Date  . ATRIAL ABLATION SURGERY    . CHOLECYSTECTOMY    . TOTAL KNEE ARTHROPLASTY Right     Family History  Problem Relation Age of Onset  . Diabetes Sister   . Diabetes Brother   . Diabetes Sister   . Diabetes Brother    Social History:  reports that he quit smoking about 23 years ago. His smoking use included Cigarettes. He has a 7.25 pack-year smoking history. He has never used smokeless  tobacco. He reports that he does not drink alcohol. His drug history is not on file.  Allergies:  Allergies  Allergen Reactions  . Azithromycin Rash  . Penicillins Rash    Has patient had a PCN reaction causing immediate rash, facial/tongue/throat swelling, SOB or lightheadedness with hypotension: Yes Has patient had a PCN reaction causing severe rash involving mucus membranes or skin necrosis: Yes Has patient had a PCN reaction that required hospitalization No Has patient had a PCN reaction occurring within the last 10 years: No If all of the above answers are "NO", then may proceed with Cephalosporin use.   [name] Lisinopril Cough  . Losartan Cough  . Metoprolol Other (See Comments)    Bradycardia.    Medications Prior to Admission  Medication Sig Dispense Refill  . amLODipine (NORVASC) 5 MG tablet Take 5 mg by mouth daily.    [name] aspirin 81 MG tablet Take 81 mg by mouth daily.    . Cholecalciferol (VITAMIN D3) 2000 units capsule Take 2,000 Units by mouth daily.    [name] docusate calcium (SURFAK) 240 MG capsule Take 1 capsule by mouth daily.    . dorzolamide-timolol (COSOPT) 22.3-6.8 MG/ML ophthalmic solution Apply 1 drop to eye 2 (two) times daily.    [name] latanoprost (XALATAN) 0.005 % ophthalmic solution Apply 1 drop to eye at bedtime.    [name]  metFORMIN (GLUCOPHAGE) 1000 MG tablet Take 1,000 mg by mouth daily with breakfast.     . Multiple Vitamins-Minerals (MULTIVITAMIN ADULT PO) Take 1 tablet by mouth daily.    . Omega-3 Fatty Acids (FISH OIL) 1200 MG CAPS Take 2 capsules by mouth daily.    . pravastatin (PRAVACHOL) 40 MG tablet Take 40 mg by mouth daily.    [name] acetaminophen (TYLENOL) 500 MG tablet Take 1,000 mg by mouth every 6 (six) hours as needed.      Results for orders placed or performed during the hospital encounter of 12/27/16 (from the past 48 hour(s))  Comprehensive metabolic panel     Status: Abnormal   Collection Time: 12/27/16 10:04 AM  Result Value Ref Range   Sodium 135 135  - 145 mmol/L   Potassium 4.1 3.5 - 5.1 mmol/L   Chloride 102 101 - 111 mmol/L   CO2 26 22 - 32 mmol/L   Glucose, Bld 263 (H) 65 - 99 mg/dL   BUN 15 6 - 20 mg/dL   Creatinine, Ser 0.94 0.61 - 1.24 mg/dL   Calcium 9.7 8.9 - 10.3 mg/dL   Total Protein 7.1 6.5 - 8.1 g/dL   Albumin 3.9 3.5 - 5.0 g/dL   AST 20 15 - 41 U/L   ALT 21 17 - 63 U/L   Alkaline Phosphatase 71 38 - 126 U/L   Total Bilirubin 1.1 0.3 - 1.2 mg/dL   GFR calc non Af Amer >60 >60 mL/min   GFR calc Af Amer >60 >60 mL/min    Comment: (NOTE) The eGFR has been calculated using the CKD EPI equation. This calculation has not been validated in all clinical situations. eGFR's persistently <60 mL/min signify possible Chronic Kidney Disease.    Anion gap 7 5 - 15  CBC     Status: Abnormal   Collection Time: 12/27/16 10:04 AM  Result Value Ref Range   WBC 7.3 3.8 - 10.6 K/uL   RBC 5.38 4.40 - 5.90 MIL/uL   Hemoglobin 14.9 13.0 - 18.0 g/dL   HCT 45.5 40.0 - 52.0 %   MCV 84.6 80.0 - 100.0 fL   MCH 27.7 26.0 - 34.0 pg   MCHC 32.7 32.0 - 36.0 g/dL   RDW 14.6 (H) 11.5 - 14.5 %   Platelets 281 150 - 440 K/uL  Type and screen [place]     Status: None (Preliminary result)   Collection Time: 12/27/16 10:04 AM  Result Value Ref Range   ABO/RH(D) B POS    Antibody Screen NEG    Sample Expiration 12/30/2016    Unit Number [id number]    Blood Component Type RED CELLS,LR    Unit division 00    Status of Unit ISSUED,FINAL    Transfusion Status OK TO TRANSFUSE    Crossmatch Result COMPATIBLE    Unit Number [id number]    Blood Component Type RBC LR PHER2    Unit division 00    Status of Unit REL FROM [facility]    Transfusion Status OK TO TRANSFUSE    Crossmatch Result COMPATIBLE    Unit Number [id number]    Blood Component Type RED CELLS,LR    Unit division 00    Status of Unit ALLOCATED    Transfusion Status OK TO TRANSFUSE    Crossmatch Result Compatible    Unit Number [id number]     Blood Component Type RED CELLS,LR    Unit division 00    Status of Unit REL FROM  ALLOC    Transfusion Status OK TO TRANSFUSE    Crossmatch Result Compatible    Unit Number [id number]    Blood Component Type RBC LR PHER2    Unit division 00    Status of Unit ISSUED    Transfusion Status OK TO TRANSFUSE    Crossmatch Result Compatible    Unit Number [id number]    Blood Component Type RED CELLS,LR    Unit division 00    Status of Unit ALLOCATED    Transfusion Status OK TO TRANSFUSE    Crossmatch Result Compatible    Unit Number [id number]    Blood Component Type RBC LR PHER2    Unit division 00    Status of Unit ALLOCATED    Transfusion Status OK TO TRANSFUSE    Crossmatch Result Compatible   Troponin I     Status: None   Collection Time: 12/27/16 10:04 AM  Result Value Ref Range   Troponin I <0.03 <0.03 ng/mL  Hemoglobin     Status: Abnormal   Collection Time: 12/27/16 12:17 PM  Result Value Ref Range   Hemoglobin 12.0 (L) 13.0 - 18.0 g/dL  ABO/Rh     Status: None   Collection Time: 12/27/16 12:17 PM  Result Value Ref Range   ABO/RH(D) B POS   Prepare RBC     Status: None   Collection Time: 12/27/16 12:30 PM  Result Value Ref Range   Order Confirmation ORDER PROCESSED BY BLOOD BANK   Glucose, capillary     Status: Abnormal   Collection Time: 12/27/16  5:18 PM  Result Value Ref Range   Glucose-Capillary 155 (H) 65 - 99 mg/dL  Hemoglobin     Status: None   Collection Time: 12/27/16  6:23 PM  Result Value Ref Range   Hemoglobin 13.0 13.0 - 18.0 g/dL  Glucose, capillary     Status: Abnormal   Collection Time: 12/27/16  9:10 PM  Result Value Ref Range   Glucose-Capillary 222 (H) 65 - 99 mg/dL  MRSA PCR Screening     Status: None   Collection Time: 12/27/16  9:15 PM  Result Value Ref Range   MRSA by PCR NEGATIVE NEGATIVE    Comment:        The GeneXpert MRSA Assay (FDA approved for NASAL specimens only), is one component of a comprehensive MRSA  colonization surveillance program. It is not intended to diagnose MRSA infection nor to guide or monitor treatment for MRSA infections.   Basic metabolic panel     Status: Abnormal   Collection Time: 12/28/16  3:43 AM  Result Value Ref Range   Sodium 139 135 - 145 mmol/L   Potassium 4.4 3.5 - 5.1 mmol/L   Chloride 110 101 - 111 mmol/L   CO2 25 22 - 32 mmol/L   Glucose, Bld 222 (H) 65 - 99 mg/dL   BUN 16 6 - 20 mg/dL   Creatinine, Ser 0.85 0.61 - 1.24 mg/dL   Calcium 8.9 8.9 - 10.3 mg/dL   GFR calc non Af Amer >60 >60 mL/min   GFR calc Af Amer >60 >60 mL/min    Comment: (NOTE) The eGFR has been calculated using the CKD EPI equation. This calculation has not been validated in all clinical situations. eGFR's persistently <60 mL/min signify possible Chronic Kidney Disease.    Anion gap 4 (L) 5 - 15  CBC     Status: Abnormal   Collection Time: 12/28/16  3:43 AM  Result Value Ref Range  WBC 14.2 (H) 3.8 - 10.6 K/uL   RBC 3.82 (L) 4.40 - 5.90 MIL/uL   Hemoglobin 10.9 (L) 13.0 - 18.0 g/dL   HCT 32.5 (L) 40.0 - 52.0 %   MCV 85.0 80.0 - 100.0 fL   MCH 28.6 26.0 - 34.0 pg   MCHC 33.6 32.0 - 36.0 g/dL   RDW 14.4 11.5 - 14.5 %   Platelets 199 150 - 440 K/uL  Glucose, capillary     Status: Abnormal   Collection Time: 12/28/16  7:18 AM  Result Value Ref Range   Glucose-Capillary 174 (H) 65 - 99 mg/dL  Glucose, capillary     Status: Abnormal   Collection Time: 12/28/16 11:16 AM  Result Value Ref Range   Glucose-Capillary 179 (H) 65 - 99 mg/dL  CBC with Differential/Platelet     Status: Abnormal   Collection Time: 12/28/16  3:50 PM  Result Value Ref Range   WBC 16.6 (H) 3.8 - 10.6 K/uL   RBC 2.80 (L) 4.40 - 5.90 MIL/uL   Hemoglobin 7.8 (L) 13.0 - 18.0 g/dL   HCT 24.2 (L) 40.0 - 52.0 %   MCV 86.5 80.0 - 100.0 fL   MCH 27.9 26.0 - 34.0 pg   MCHC 32.2 32.0 - 36.0 g/dL   RDW 14.3 11.5 - 14.5 %   Platelets 184 150 - 440 K/uL   Neutrophils Relative % 75 %   Neutro Abs 12.4 (H)  1.4 - 6.5 K/uL   Lymphocytes Relative 17 %   Lymphs Abs 2.9 1.0 - 3.6 K/uL   Monocytes Relative 8 %   Monocytes Absolute 1.3 (H) 0.2 - 1.0 K/uL   Eosinophils Relative 0 %   Eosinophils Absolute 0.0 0 - 0.7 K/uL   Basophils Relative 0 %   Basophils Absolute 0.0 0 - 0.1 K/uL  Lactic acid, plasma     Status: Abnormal   Collection Time: 12/28/16  3:50 PM  Result Value Ref Range   Lactic Acid, Venous 4.2 (HH) 0.5 - 1.9 mmol/L    Comment: CRITICAL RESULT CALLED TO, READ BACK BY AND VERIFIED WITH MYRA FLOWERS @ 1741 12/28/16 BY T[facility]   Ammonia     Status: Abnormal   Collection Time: 12/28/16  3:50 PM  Result Value Ref Range   Ammonia 46 (H) 9 - 35 umol/L  Protime-INR     Status: Abnormal   Collection Time: 12/28/16  3:50 PM  Result Value Ref Range   Prothrombin Time 15.7 (H) 11.4 - 15.2 seconds   INR 1.24   APTT     Status: None   Collection Time: 12/28/16  3:50 PM  Result Value Ref Range   aPTT 25 24 - 36 seconds  Glucose, capillary     Status: Abnormal   Collection Time: 12/28/16  5:08 PM  Result Value Ref Range   Glucose-Capillary 287 (H) 65 - 99 mg/dL  Prepare RBC     Status: None   Collection Time: 12/28/16  6:30 PM  Result Value Ref Range   Order Confirmation      2 UNITS TOTAL PER DR. Darvin Neighbours 12/28/16 1750 SJL ORDER PROCESSED BY BLOOD BANK   Nm Gi Blood Loss  Result Date: 12/27/2016 CLINICAL DATA:  GI bleed. EXAM: NUCLEAR MEDICINE GASTROINTESTINAL BLEEDING SCAN TECHNIQUE: Sequential abdominal images were obtained following intravenous administration of Tc-43mlabeled red blood cells. RADIOPHARMACEUTICALS:  19.017 mCi Tc-947mn-vitro labeled red cells. COMPARISON:  None. FINDINGS: There is immediate abnormal activity in loops of bowel in the  lower pelvis, probably in the sigmoid portion of the colon. IMPRESSION: Abnormal GI bleeding scan. The study is positive for bleeding in the distal colon. Electronically Signed   By: Lorriane Shire M.D.   On: 12/27/2016 16:04     ROS  Blood pressure 132/75, pulse (!) 122, temperature 97.8 F (36.6 C), resp. rate (!) 21, height 6' 4"  (1.93 m), weight 291 lb (132 kg), SpO2 100 %. Physical Exam  Vitals reviewed. Constitutional: He is oriented to person, place, and time. He appears well-developed and well-nourished. No distress.  HENT:  Head: Normocephalic and atraumatic.  Right Ear: External ear normal.  Left Ear: External ear normal.  Nose: Nose normal.  Mouth/Throat: Oropharynx is clear and moist. No oropharyngeal exudate.  Eyes: Conjunctivae and EOM are normal. Pupils are equal, round, and reactive to light. No scleral icterus.  Neck: Normal range of motion. Neck supple. No tracheal deviation present.  Cardiovascular: Normal heart sounds and intact distal pulses.  Exam reveals no gallop and no friction rub.   No murmur heard. tachycardic  Respiratory: Effort normal and breath sounds normal. No respiratory distress. He has no wheezes. He has no rales.  GI: Soft. Bowel sounds are normal. He exhibits distension. There is no tenderness. There is no rebound and no guarding.  Obese mildly distended nontender well-healed scars from previous cholecystectomy  Musculoskeletal: Normal range of motion. He exhibits no edema, tenderness or deformity.  Neurological: He is alert and oriented to person, place, and time. No cranial nerve deficit.  Skin: Skin is warm and dry. No erythema. There is pallor.  Psychiatric: He has a normal mood and affect. His behavior is normal. Judgment and thought content normal.     Assessment/Plan 71 year old with a severe GI bleed. I personally reviewed his past medical history wears had this previously and previous colonoscopies back in 2013 as well as 2015. I also personally reviewed his laboratory values of significance where his white blood cell count has now significantly increased up to 16.6 and his hemoglobin has dropped from an initial of 13 down to 10 and then and it dropped a day  down to 7.8. I personally reviewed his nuclear medicine study that shows some blush in the sigmoid colon as well as the images from his angiography. I also discussed this with Dr. Fritzi Mandes of internal medicine and Dr.Sudini as well.  I discussed with the patient that we would give him some blood products and resuscitate him up as he was currently hypotensive in the ICU with blood pressure of 80s over 50s. He has 2 units of blood coming up and the nurses currently hanging 2 L worth of fluids as well. Once the patient is resuscitated if he continues to have a drop in his hemoglobin or further drop in his blood pressure or further bright red blood per rectum he will need to be taken to the OR emergently for an exploratory laparotomy and resection of a portion of the colon. I discussed with the patient that it would be an emergent operation to save his life and he would get ostomy which would be present for a few months. I did discuss some of the risk and benefits with the patient including bleeding infection need to leave his abdomen open, need for prolonged intubation and ICU stay, need for further procedures, ostomy placement, injury to bowel and other structures such as the ureter and need for drain placement. As well as anesthetic risk of heart attack stroke and blood clots  to the lungs and legs and potentially death. The patient was given opportunity to ask questions and have them answered. I discussed with him that my partner Dr. Hampton Abbot would be here overnight for surgery and will be keeping an eye on him to ensure that he doesn't need to proceed to the OR.  Hubbard Robinson, MD 12/28/2016, 7:10 PM

## 2016-12-29 ENCOUNTER — Inpatient Hospital Stay: Payer: Medicare Other

## 2016-12-29 DIAGNOSIS — K625 Hemorrhage of anus and rectum: Secondary | ICD-10-CM

## 2016-12-29 LAB — GLUCOSE, CAPILLARY
GLUCOSE-CAPILLARY: 257 mg/dL — AB (ref 65–99)
GLUCOSE-CAPILLARY: 190 mg/dL — AB (ref 65–99)
GLUCOSE-CAPILLARY: 129 mg/dL — AB (ref 65–99)
Glucose-Capillary: 154 mg/dL — ABNORMAL HIGH (ref 65–99)
Glucose-Capillary: 102 mg/dL — ABNORMAL HIGH (ref 65–99)

## 2016-12-29 LAB — LACTIC ACID, PLASMA
LACTIC ACID, VENOUS: 4.8 mmol/L — AB (ref 0.5–1.9)
LACTIC ACID, VENOUS: 2.9 mmol/L — AB (ref 0.5–1.9)

## 2016-12-29 LAB — CBC
HEMATOCRIT: 24.7 % — AB (ref 40.0–52.0)
HEMOGLOBIN: 8.2 g/dL — AB (ref 13.0–18.0)
MCH: 28.2 pg (ref 26.0–34.0)
MCHC: 33 g/dL (ref 32.0–36.0)
MCV: 85.4 fL (ref 80.0–100.0)
Platelets: 144 10*3/uL — ABNORMAL LOW (ref 150–440)
RBC: 2.89 MIL/uL — AB (ref 4.40–5.90)
RDW: 14.6 % — ABNORMAL HIGH (ref 11.5–14.5)
WBC: 24.7 10*3/uL — ABNORMAL HIGH (ref 3.8–10.6)

## 2016-12-29 LAB — HEMOGLOBIN
HEMOGLOBIN: 7.2 g/dL — AB (ref 13.0–18.0)
Hemoglobin: 8.6 g/dL — ABNORMAL LOW (ref 13.0–18.0)

## 2016-12-29 LAB — PREPARE RBC (CROSSMATCH)

## 2016-12-29 MED ORDER — SODIUM CHLORIDE 0.9 % IV SOLN
INTRAVENOUS | Status: DC
  Administered 2016-12-29 – 2016-12-31 (×6): via INTRAVENOUS

## 2016-12-29 MED ORDER — ACETAMINOPHEN 325 MG PO TABS
650.0000 mg | ORAL_TABLET | Freq: Once | ORAL | Status: AC
  Administered 2016-12-29: 650 mg via ORAL
  Filled 2016-12-29: qty 2

## 2016-12-29 MED ORDER — SODIUM CHLORIDE 0.9 % IV SOLN
Freq: Once | INTRAVENOUS | Status: AC
  Administered 2016-12-30: via INTRAVENOUS

## 2016-12-29 MED ORDER — MEROPENEM-SODIUM CHLORIDE 1 GM/50ML IV SOLR
1.0000 g | Freq: Three times a day (TID) | INTRAVENOUS | Status: DC
  Administered 2016-12-29 – 2017-01-03 (×14): 1 g via INTRAVENOUS
  Filled 2016-12-29 (×18): qty 50

## 2016-12-29 MED ORDER — DIPHENHYDRAMINE HCL 25 MG PO CAPS
25.0000 mg | ORAL_CAPSULE | Freq: Once | ORAL | Status: AC
  Administered 2016-12-29: 25 mg via ORAL
  Filled 2016-12-29: qty 1

## 2016-12-29 MED ORDER — MEROPENEM 1 G IV SOLR
1.0000 g | Freq: Three times a day (TID) | INTRAVENOUS | Status: AC
  Administered 2016-12-29 (×2): 1 g via INTRAVENOUS
  Filled 2016-12-29 (×3): qty 1

## 2016-12-29 MED ORDER — SODIUM CHLORIDE 0.9 % IV BOLUS (SEPSIS)
250.0000 mL | Freq: Once | INTRAVENOUS | Status: AC
  Administered 2016-12-29: 250 mL via INTRAVENOUS

## 2016-12-29 MED ORDER — ORAL CARE MOUTH RINSE
15.0000 mL | Freq: Two times a day (BID) | OROMUCOSAL | Status: DC
  Administered 2016-12-29: 15 mL via OROMUCOSAL

## 2016-12-29 MED ORDER — SODIUM CHLORIDE 0.9 % IV SOLN
Freq: Once | INTRAVENOUS | Status: AC
  Administered 2016-12-29: 04:00:00 via INTRAVENOUS

## 2016-12-29 NOTE — Progress Notes (Signed)
CC: GI bleed Subjective: This patient is experiencing a GI bleed. Currently the patient has not had any further bleeding following a barium enema ordered by Dr. Hampton Abbot. Currently the patient is stable and not experience any further bleeding.  He has no nausea vomiting no fevers or chills  Objective: Vital signs in last 24 hours: Temp:  [97.5 F (36.4 C)-99 F (37.2 C)] 98.8 F (37.1 C) (12/30 0800) Pulse Rate:  [93-137] 116 (12/30 0900) Resp:  [1-31] 23 (12/30 0900) BP: (64-140)/(19-106) 104/72 (12/30 0900) SpO2:  [91 %-100 %] 100 % (12/30 0900) Last BM Date: 12/29/16  Intake/Output from previous day: 12/29 0701 - 12/30 0700 In: 1495.2 [I.V.:1135.2; Blood:300] Out: 1400 [Urine:800; Stool:600] Intake/Output this shift: Total I/O In: 451.3 [I.V.:351.3; IV Piggyback:100] Out: -   Physical exam:  Abdomen is distended tympanitic but nontender. Vital signs are reviewed. Patient is awake alert and oriented. Nontender calves.  Lab Results: CBC   Recent Labs  12/28/16 1550 12/29/16 0124  WBC 16.6* 24.7*  HGB 7.8* 8.2*  HCT 24.2* 24.7*  PLT 184 144*   BMET  Recent Labs  12/27/16 1004 12/28/16 0343  NA 135 139  K 4.1 4.4  CL 102 110  CO2 26 25  GLUCOSE 263* 222*  BUN 15 16  CREATININE 0.94 0.85  CALCIUM 9.7 8.9   PT/INR  Recent Labs  12/28/16 1550  LABPROT 15.7*  INR 1.24   ABG No results for input(s): PHART, HCO3 in the last 72 hours.  Invalid input(s): PCO2, PO2  Studies/Results: Nm Gi Blood Loss  Result Date: 12/27/2016 CLINICAL DATA:  GI bleed. EXAM: NUCLEAR MEDICINE GASTROINTESTINAL BLEEDING SCAN TECHNIQUE: Sequential abdominal images were obtained following intravenous administration of Tc-58m labeled red blood cells. RADIOPHARMACEUTICALS:  19.017 mCi Tc-61m in-vitro labeled red cells. COMPARISON:  None. FINDINGS: There is immediate abnormal activity in loops of bowel in the lower pelvis, probably in the sigmoid portion of the colon. IMPRESSION:  Abnormal GI bleeding scan. The study is positive for bleeding in the distal colon. Electronically Signed   By: Lorriane Shire M.D.   On: 12/27/2016 16:04   Dg Colon Alonza Bogus Hi Den Cm  Addendum Date: 12/29/2016   ADDENDUM REPORT: 12/29/2016 08:43 ADDENDUM: Findings and recommendations also conveyed toDr. Steele Sizer 12/29/2016 at8:00. Electronically Signed   By: Suzy Bouchard M.D.   On: 12/29/2016 08:43   Result Date: 12/29/2016 CLINICAL DATA:  Sigmoid colon gastrointestinal bleeding. EXAM: SINGLE COLUMN BARIUM ENEMA TECHNIQUE: Initial scout AP supine abdominal image obtained to insure adequate colon cleansing. Barium was introduced into the colon in a retrograde fashion and refluxed from the rectum to the cecum. Spot images of the colon followed by overhead radiographs were obtained. FLUOROSCOPY TIME:  Fluoroscopy Time:  3.24 Radiation Exposure Index (if provided by the fluoroscopic device): 136 mGy Number of Acquired Spot Images: 2 COMPARISON:  None. FINDINGS: Intermittent inserted into the rectum and retention balloon inflated. High-density barium was introduced into the rectum via gravity feed. Contrast flowed into the sigmoid colon. Contrast could not be advanced past the sigmoid colon. 1200 cc was introduced with ultimately leakage around the enema tip and no advancement into the descending colon. No diverticular disease noted in the sigmoid colon. IMPRESSION: 1. High-density therapy barium enema for gastrointestinal bleeding per general surgery request. 2. No evidence of diverticular disease in the sigmoid colon. 3. Contrast could not be advanced past the proximal sigmoid colon. Cannot exclude obstructing mass. Recommend CT of the abdomen and pelvis for further evaluation (recognizing  some artifact from the high-density barium). Initial Findings conveyed toJOSE PISCOYA on 12/29/2016  at3:45. Electronically Signed: By: Suzy Bouchard M.D. On: 12/29/2016 07:49    Anti-infectives: Anti-infectives     Start     Dose/Rate Route Frequency Ordered Stop   12/29/16 2200  meropenem (MERREM) IVPB SOLR 1 g     1 g 100 mL/hr over 30 Minutes Intravenous Every 8 hours 12/29/16 0801     12/29/16 0600  meropenem (MERREM) 1 g in sodium chloride 0.9 % 100 mL IVPB     1 g 200 mL/hr over 30 Minutes Intravenous Every 8 hours 12/29/16 0228 12/29/16 1800   12/28/16 0800  clindamycin (CLEOCIN) IVPB 300 mg  Status:  Discontinued     300 mg 100 mL/hr over 30 Minutes Intravenous  Once 12/28/16 0753 12/28/16 0908   12/28/16 0754  clindamycin (CLEOCIN) 300 MG/50ML IVPB    Comments:  Lisbeth Renshaw: cabinet override      12/28/16 0754 12/28/16 0754      Assessment/Plan:  Patient studies of been reviewed and I discussed this patient with both prime doc critical care medicine and Dr. Hampton Abbot. I suspected as the patient is not bleeding at this time he will bleed again in the near future. His red cell study and embolization suggests that this is sigmoid colon. I believe the patient requires a single-stage colon resection during this hospitalization. I believe he is at high risk for rebleeding and while he is not bleeding at this time he is likely to rebleed in the near future. I discussed with the patient and his wife as well as providers this plan. If he bleeds emergently then a Hartman's procedure would be necessary at this time with a colostomy. If he does not bleed over the next day or 2 then the patient could be prepped properly and a laparoscopic or laparotomy could be performed and a single-stage colon resection with anastomosis would benefit the patient. The high risk of rebleeding the and the risk of returning with a low hemoglobin and a cardiovascular collapse is high and discussed with patient and provider. I believe the patient will benefit from elective surgery during this hospitalization unless he requires emergent surgery for rebleeding in the next 48 hours  Florene Glen, MD, FACS  12/29/2016

## 2016-12-29 NOTE — Progress Notes (Signed)
Pt. HR sustaining >130, BP stable, medium sized bright red blood w/ clots from rectum. Spoke w/ Dr. Hampton Abbot from general surgery and the hospitalist (sudini) on call. Pt. Currently receiving first of 2 units of RBC's, will continue to monitor pt. And keep Dr. Hampton Abbot informed of further bleeding.

## 2016-12-29 NOTE — Progress Notes (Signed)
Pt. Travelled to radiology for barrium enema. Left ICU @ 0305 VSS, 2L O2 [place].  Pt. Back on unit at [id number], VSS, no complications. Will continue to monitor pt. Closely.

## 2016-12-29 NOTE — Progress Notes (Signed)
Pt. VSS, A&O x4, Rm air, no s/s of bleeding, last Hgb. Showed 7.2- spoke with Dr. Jannifer Franklin r/t this level- 1 unit of RBC's ordered. Wife was alerted to move.   Called report to Kristine Garbe, RN on 2A.

## 2016-12-29 NOTE — Progress Notes (Signed)
Pt. Continues to have bright red bloody stools, post transfusion H&H results called to Dr. Hampton Abbot from General surgery.  Discussed pt.'s SBP in the 80's, and further bloody stools as well as lab results. MD ordered IVF @ 75 and 1 unit of blood. Will call radiology and plans to give pt. A barrium enema. Will continue to monitor pt. Closely.

## 2016-12-29 NOTE — Progress Notes (Signed)
[facility] [place] NAME: Evan [name]    MR#:  [id number]  DATE OF BIRTH:  04-04-1945  SUBJECTIVE:   Came in with bright red blood per rectum became hypotensive and presyncopal. Status post MicroBid embolization. Doing better. Mild tachycardia. Denies any pain. Pt rebled heavily and required 3 units of BT Pt underwent high density Ba enema REVIEW OF SYSTEMS:   Review of Systems  Constitutional: Negative for chills, fever and weight loss.  HENT: Negative for ear discharge, ear pain and nosebleeds.   Eyes: Negative for blurred vision, pain and discharge.  Respiratory: Negative for sputum production, shortness of breath, wheezing and stridor.   Cardiovascular: Negative for chest pain, palpitations, orthopnea and PND.  Gastrointestinal: Positive for blood in stool. Negative for abdominal pain, diarrhea, nausea and vomiting.  Genitourinary: Negative for frequency and urgency.  Musculoskeletal: Negative for back pain and joint pain.  Neurological: Positive for weakness. Negative for sensory change, speech change and focal weakness.  Psychiatric/Behavioral: Negative for depression and hallucinations. The patient is not nervous/anxious.    Tolerating Diet: Soft diet Tolerating PT: Pending  DRUG ALLERGIES:   Allergies  Allergen Reactions  . Azithromycin Rash  . Penicillins Rash    Has patient had a PCN reaction causing immediate rash, facial/tongue/throat swelling, SOB or lightheadedness with hypotension: Yes Has patient had a PCN reaction causing severe rash involving mucus membranes or skin necrosis: Yes Has patient had a PCN reaction that required hospitalization No Has patient had a PCN reaction occurring within the last 10 years: No If all of the above answers are "NO", then may proceed with Cephalosporin use.   [name] Lisinopril Cough  . Losartan Cough  . Metoprolol Other (See Comments)    Bradycardia.    VITALS:  Blood  pressure (!) 126/99, pulse (!) 116, temperature 98.8 F (37.1 C), temperature source Oral, resp. rate (!) 22, height 6\' 4"  (1.93 m), weight 132 kg (291 lb), SpO2 99 %.  PHYSICAL EXAMINATION:   Physical Exam  GENERAL:  71 y.o.-year-old patient lying in the bed with no acute distress. Obese EYES: Pupils equal, round, reactive to light and accommodation. No scleral icterus. Extraocular muscles intact.  HEENT: Head atraumatic, normocephalic. Oropharynx and nasopharynx clear.  NECK:  Supple, no jugular venous distention. No thyroid enlargement, no tenderness.  LUNGS: Normal breath sounds bilaterally, no wheezing, rales, rhonchi. No use of accessory muscles of respiration.  CARDIOVASCULAR: S1, S2 normal. No murmurs, rubs, or gallops. Tachycardia ABDOMEN: Soft, nontender, nondistended. Bowel sounds present. No organomegaly or mass.  EXTREMITIES: No cyanosis, clubbing or edema b/l.    NEUROLOGIC: Cranial nerves II through XII are intact. No focal Motor or sensory deficits b/l.   PSYCHIATRIC:  patient is alert and oriented x 3.  SKIN: No obvious rash, lesion, or ulcer.   LABORATORY PANEL:  CBC  Recent Labs Lab 12/29/16 0124 12/29/16 1027  WBC 24.7*  --   HGB 8.2* 8.6*  HCT 24.7*  --   PLT 144*  --     Chemistries   Recent Labs Lab 12/27/16 1004 12/28/16 0343  NA 135 139  K 4.1 4.4  CL 102 110  CO2 26 25  GLUCOSE 263* 222*  BUN 15 16  CREATININE 0.94 0.85  CALCIUM 9.7 8.9  AST 20  --   ALT 21  --   ALKPHOS 71  --   BILITOT 1.1  --    Cardiac Enzymes  Recent Labs Lab 12/27/16  [place] <0.03   RADIOLOGY:  Nm Gi Blood Loss  Result Date: 12/27/2016 CLINICAL DATA:  GI bleed. EXAM: NUCLEAR MEDICINE GASTROINTESTINAL BLEEDING SCAN TECHNIQUE: Sequential abdominal images were obtained following intravenous administration of Tc-62m labeled red blood cells. RADIOPHARMACEUTICALS:  19.017 mCi Tc-51m in-vitro labeled red cells. COMPARISON:  None. FINDINGS: There is  immediate abnormal activity in loops of bowel in the lower pelvis, probably in the sigmoid portion of the colon. IMPRESSION: Abnormal GI bleeding scan. The study is positive for bleeding in the distal colon. Electronically Signed   By: Lorriane Shire M.D.   On: 12/27/2016 16:04   Dg Colon Alonza Bogus Hi Den Cm  Addendum Date: 12/29/2016   ADDENDUM REPORT: 12/29/2016 08:43 ADDENDUM: Findings and recommendations also conveyed toDr. Steele Sizer 12/29/2016 at8:00. Electronically Signed   By: Suzy Bouchard M.D.   On: 12/29/2016 08:43   Result Date: 12/29/2016 CLINICAL DATA:  Sigmoid colon gastrointestinal bleeding. EXAM: SINGLE COLUMN BARIUM ENEMA TECHNIQUE: Initial scout AP supine abdominal image obtained to insure adequate colon cleansing. Barium was introduced into the colon in a retrograde fashion and refluxed from the rectum to the cecum. Spot images of the colon followed by overhead radiographs were obtained. FLUOROSCOPY TIME:  Fluoroscopy Time:  3.24 Radiation Exposure Index (if provided by the fluoroscopic device): 136 mGy Number of Acquired Spot Images: 2 COMPARISON:  None. FINDINGS: Intermittent inserted into the rectum and retention balloon inflated. High-density barium was introduced into the rectum via gravity feed. Contrast flowed into the sigmoid colon. Contrast could not be advanced past the sigmoid colon. 1200 cc was introduced with ultimately leakage around the enema tip and no advancement into the descending colon. No diverticular disease noted in the sigmoid colon. IMPRESSION: 1. High-density therapy barium enema for gastrointestinal bleeding per general surgery request. 2. No evidence of diverticular disease in the sigmoid colon. 3. Contrast could not be advanced past the proximal sigmoid colon. Cannot exclude obstructing mass. Recommend CT of the abdomen and pelvis for further evaluation (recognizing some artifact from the high-density barium). Initial Findings conveyed toJOSE PISCOYA on 12/29/2016   at3:45. Electronically Signed: By: Suzy Bouchard M.D. On: 12/29/2016 07:49   ASSESSMENT AND PLAN:   Evan [name]  is a 71 y.o. male with a known history of Diverticular bleed needing vascular embolization 2013, upper GI bleed due to NSAID's in 2015 presents to the emergency room with 2 episodes of bright red blood per rectum. Patient had 2 more large volume bloody stools in the emergency room. Had one episode of lightheadedness and presyncope.  * Bright red blood per rectum with 4 episodes of large volume blood Patient Was hypotensive and symptomatic.  -Received 4 unit of blood transfusion till now -Stat bleeding scan--was positive for GI bleed. Patient underwent micro-bead embolization around the sigmoid colon area DC Protonix drip. Hold aspirin from home. -Surgical input noted. Pt ot get elective colectomy next week  * Hypertension Blood pressure stable  * Diabetes mellitus type 2 Sliding scale insulin  Case discussed with Care Management/Social Worker. Management plans discussed with the patient, family and they are in agreement.  CODE STATUS: Full  DVT Prophylaxis: SCD and teds  TOTAL criticalTIME TAKING CARE OF THIS PATIENT: 40 minutes.  >50% time spent on counselling and coordination of care  Note: This dictation was prepared with Dragon dictation along with smaller phrase technology. Any transcriptional errors that result from this process are unintentional.  Nidhi Jacome M.D on 12/29/2016 at 1:40 PM  Between 7am to 6pm -  Pager - [phone]  After 6pm go to [email or web address] - password [email or web address] [facility]  Office  [phone]  CC: Primary care physician; Ezequiel Kayser, MD

## 2016-12-30 LAB — BASIC METABOLIC PANEL
Anion gap: 2 — ABNORMAL LOW (ref 5–15)
BUN: 20 mg/dL (ref 6–20)
CALCIUM: 8.3 mg/dL — AB (ref 8.9–10.3)
CO2: 25 mmol/L (ref 22–32)
CREATININE: 0.91 mg/dL (ref 0.61–1.24)
Chloride: 115 mmol/L — ABNORMAL HIGH (ref 101–111)
GFR calc non Af Amer: >60 mL/min (ref >60)
GLUCOSE: 139 mg/dL — AB (ref 65–99)
Potassium: 4.1 mmol/L (ref 3.5–5.1)
Sodium: 142 mmol/L (ref 135–145)

## 2016-12-30 LAB — CBC
HCT: 24 % — ABNORMAL LOW (ref 40.0–52.0)
Hemoglobin: 8.1 g/dL — ABNORMAL LOW (ref 13.0–18.0)
MCH: 28 pg (ref 26.0–34.0)
MCHC: 33.9 g/dL (ref 32.0–36.0)
MCV: 82.7 fL (ref 80.0–100.0)
PLATELETS: 101 10*3/uL — AB (ref 150–440)
RBC: 2.9 MIL/uL — ABNORMAL LOW (ref 4.40–5.90)
RDW: 15.8 % — ABNORMAL HIGH (ref 11.5–14.5)
WBC: 31.4 10*3/uL — ABNORMAL HIGH (ref 3.8–10.6)

## 2016-12-30 LAB — GLUCOSE, CAPILLARY
GLUCOSE-CAPILLARY: 141 mg/dL — AB (ref 65–99)
GLUCOSE-CAPILLARY: 118 mg/dL — AB (ref 65–99)
Glucose-Capillary: 122 mg/dL — ABNORMAL HIGH (ref 65–99)
Glucose-Capillary: 126 mg/dL — ABNORMAL HIGH (ref 65–99)
Glucose-Capillary: 121 mg/dL — ABNORMAL HIGH (ref 65–99)
Glucose-Capillary: 115 mg/dL — ABNORMAL HIGH (ref 65–99)

## 2016-12-30 NOTE — Progress Notes (Signed)
A&O. Up with one assist. Transferred from CCU. Ordered to receive 2 Units of blood. No complaints. OnRA. IV antibiotics infusing.

## 2016-12-30 NOTE — Progress Notes (Signed)
CC: Lower GI bleed Subjective: Patient has no complaints thinks he may have had a minimal amount of blood today. As no dizziness no chest pain.  Objective: Vital signs in last 24 hours: Temp:  [98 F (36.7 C)-99.3 F (37.4 C)] 98 F (36.7 C) (12/31 0615) Pulse Rate:  [88-120] 100 (12/31 0615) Resp:  [13-23] 16 (12/31 0615) BP: (97-129)/(51-99) 110/67 (12/31 0615) SpO2:  [93 %-100 %] 97 % (12/31 0615) Weight:  [298 lb 6.4 oz (135.4 kg)-298 lb 11.2 oz (135.5 kg)] 298 lb 6.4 oz (135.4 kg) (12/31 0537) Last BM Date: 12/29/16  Intake/Output from previous day: 12/30 0701 - 12/31 0700 In: 3373.5 [I.V.:2402.3; Blood:671.3; IV [id number] Out: 1250 [id number] Intake/Output this shift: No intake/output data recorded.  Physical exam:  Awake alert and oriented. Vital signs are reviewed. Abdomen is soft nondistended nontender. Calves are nontender.  Lab Results: CBC   Recent Labs  12/29/16 0124  12/29/16 2025 12/30/16 0710  WBC 24.7*  --   --  31.4*  HGB 8.2*  < > 7.2* 8.1*  HCT 24.7*  --   --  24.0*  PLT 144*  --   --  101*  < > = values in this interval not displayed. BMET  Recent Labs  12/27/16 1004 12/28/16 0343  NA 135 139  K 4.1 4.4  CL 102 110  CO2 26 25  GLUCOSE 263* 222*  BUN 15 16  CREATININE 0.94 0.85  CALCIUM 9.7 8.9   PT/INR  Recent Labs  12/28/16 1550  LABPROT 15.7*  INR 1.24   ABG No results for input(s): PHART, HCO3 in the last 72 hours.  Invalid input(s): PCO2, PO2  Studies/Results: Dg Colon W/air Hi Den Cm  Addendum Date: 12/29/2016   ADDENDUM REPORT: 12/29/2016 08:43 ADDENDUM: Findings and recommendations also conveyed toDr. Steele Sizer 12/29/2016 at8:00. Electronically Signed   By: Suzy Bouchard M.D.   On: 12/29/2016 08:43   Result Date: 12/29/2016 CLINICAL DATA:  Sigmoid colon gastrointestinal bleeding. EXAM: SINGLE COLUMN BARIUM ENEMA TECHNIQUE: Initial scout AP supine abdominal image obtained to insure adequate colon cleansing.  Barium was introduced into the colon in a retrograde fashion and refluxed from the rectum to the cecum. Spot images of the colon followed by overhead radiographs were obtained. FLUOROSCOPY TIME:  Fluoroscopy Time:  3.24 Radiation Exposure Index (if provided by the fluoroscopic device): 136 mGy Number of Acquired Spot Images: 2 COMPARISON:  None. FINDINGS: Intermittent inserted into the rectum and retention balloon inflated. High-density barium was introduced into the rectum via gravity feed. Contrast flowed into the sigmoid colon. Contrast could not be advanced past the sigmoid colon. 1200 cc was introduced with ultimately leakage around the enema tip and no advancement into the descending colon. No diverticular disease noted in the sigmoid colon. IMPRESSION: 1. High-density therapy barium enema for gastrointestinal bleeding per general surgery request. 2. No evidence of diverticular disease in the sigmoid colon. 3. Contrast could not be advanced past the proximal sigmoid colon. Cannot exclude obstructing mass. Recommend CT of the abdomen and pelvis for further evaluation (recognizing some artifact from the high-density barium). Initial Findings conveyed toJOSE PISCOYA on 12/29/2016  at3:45. Electronically Signed: By: Suzy Bouchard M.D. On: 12/29/2016 07:49    Anti-infectives: Anti-infectives    Start     Dose/Rate Route Frequency Ordered Stop   12/29/16 2200  meropenem (MERREM) IVPB SOLR 1 g     1 g 100 mL/hr over 30 Minutes Intravenous Every 8 hours 12/29/16 0801  12/29/16 0600  meropenem (MERREM) 1 g in sodium chloride 0.9 % 100 mL IVPB     1 g 200 mL/hr over 30 Minutes Intravenous Every 8 hours 12/29/16 0228 12/29/16 1322   12/28/16 0800  clindamycin (CLEOCIN) IVPB 300 mg  Status:  Discontinued     300 mg 100 mL/hr over 30 Minutes Intravenous  Once 12/28/16 0753 12/28/16 0908   12/28/16 0754  clindamycin (CLEOCIN) 300 MG/50ML IVPB    Comments:  Lisbeth Renshaw: cabinet override       12/28/16 0754 12/28/16 0754      Assessment/Plan:  Lower GI bleed likely secondary to diverticulosis. Tag red cell scan was reviewed.  Yesterday I discussed with the patient and his wife the rationale for an elective surgery should he not bleed again during this hospitalization. The risk of him recurring was discussed. Risk of a cardiovascular event with hypotension and will repeat lower GI bleed was reviewed. This morning the patient is in agreement he's had time to think about it and talk to his family and he wishes to have a 1 stage operation done during this hospitalization. C rebleeds a large amount and needs an emergency Hartman's procedure we will plan proceeding with a colon resection this week. He can BE prep tomorrow and have surgery at Dr.'Piscoya's discretion. I will leave the exact discussion of risks and procedure to [place].  Florene Glen, MD, FACS  12/30/2016

## 2016-12-30 NOTE — Progress Notes (Signed)
[facility] [place] NAME: Evan [name]    MR#:  [id number]  DATE OF BIRTH:  11/29/1945  SUBJECTIVE:  CHIEF COMPLAINT:   Chief Complaint  Patient presents with  . GI Bleeding   -Admitted with diverticular bleed, had ongoing bleeding in the hospital requiring microembolization. -Received total of 6 units of blood so far of which she received 2 units last night. -Hemoglobin stable at 8.1. -Plan for elective colectomy this Tuesday.  REVIEW OF SYSTEMS:  Review of Systems  Constitutional: Negative for chills, fever and malaise/fatigue.  HENT: Negative for congestion, hearing loss, nosebleeds and tinnitus.   Eyes: Negative for blurred vision and double vision.  Respiratory: Negative for cough, shortness of breath and wheezing.   Cardiovascular: Negative for chest pain, palpitations and leg swelling.  Gastrointestinal: Positive for blood in stool. Negative for abdominal pain, constipation, diarrhea, nausea and vomiting.  Genitourinary: Negative for dysuria and urgency.  Musculoskeletal: Negative for myalgias.  Neurological: Negative for dizziness, speech change, focal weakness, seizures and headaches.  Psychiatric/Behavioral: Negative for depression.    DRUG ALLERGIES:   Allergies  Allergen Reactions  . Azithromycin Rash  . Penicillins Rash    Has patient had a PCN reaction causing immediate rash, facial/tongue/throat swelling, SOB or lightheadedness with hypotension: Yes Has patient had a PCN reaction causing severe rash involving mucus membranes or skin necrosis: Yes Has patient had a PCN reaction that required hospitalization No Has patient had a PCN reaction occurring within the last 10 years: No If all of the above answers are "NO", then may proceed with Cephalosporin use.   [name] Lisinopril Cough  . Losartan Cough  . Metoprolol Other (See Comments)    Bradycardia.    VITALS:  Blood pressure 118/68, pulse (!) 102, temperature  98.3 F (36.8 C), temperature source Oral, resp. rate 18, height 6\' 4"  (1.93 m), weight 135.4 kg (298 lb 6.4 oz), SpO2 100 %.  PHYSICAL EXAMINATION:  Physical Exam  GENERAL:  71 y.o.-year-old patient lying in the bed with no acute distress. Obese male. EYES: Pupils equal, round, reactive to light and accommodation. No scleral icterus. Extraocular muscles intact.  HEENT: Head atraumatic, normocephalic. Oropharynx and nasopharynx clear.  NECK:  Supple, no jugular venous distention. No thyroid enlargement, no tenderness.  LUNGS: Normal breath sounds bilaterally, no wheezing, rales,rhonchi or crepitation. No use of accessory muscles of respiration. Decreased bibasilar breath sounds. CARDIOVASCULAR: S1, S2 normal. No murmurs, rubs, or gallops.  ABDOMEN: Soft, nontender, nondistended. Bowel sounds present. No organomegaly or mass.  EXTREMITIES: No pedal edema, cyanosis, or clubbing.  NEUROLOGIC: Cranial nerves II through XII are intact. Muscle strength 5/5 in all extremities. Sensation intact. Gait not checked.  PSYCHIATRIC: The patient is alert and oriented x 3.  SKIN: No obvious rash, lesion, or ulcer.    LABORATORY PANEL:   CBC  Recent Labs Lab 12/30/16 0710  WBC 31.4*  HGB 8.1*  HCT 24.0*  PLT 101*   ------------------------------------------------------------------------------------------------------------------  Chemistries   Recent Labs Lab 12/27/16 1004  12/30/16 0710  NA 135  < > 142  K 4.1  < > 4.1  CL 102  < > 115*  CO2 26  < > 25  GLUCOSE 263*  < > 139*  BUN 15  < > 20  CREATININE 0.94  < > 0.91  CALCIUM 9.7  < > 8.3*  AST 20  --   --   ALT 21  --   --  ALKPHOS 71  --   --   BILITOT 1.1  --   --   < > = values in this interval not displayed. ------------------------------------------------------------------------------------------------------------------  Cardiac Enzymes  Recent Labs Lab 12/27/16 1004  TROPONINI <0.03    ------------------------------------------------------------------------------------------------------------------  RADIOLOGY:  Dg Colon Alonza Bogus Hi Den Cm  Addendum Date: 12/29/2016   ADDENDUM REPORT: 12/29/2016 08:43 ADDENDUM: Findings and recommendations also conveyed toDr. Steele Sizer 12/29/2016 at8:00. Electronically Signed   By: Suzy Bouchard M.D.   On: 12/29/2016 08:43   Result Date: 12/29/2016 CLINICAL DATA:  Sigmoid colon gastrointestinal bleeding. EXAM: SINGLE COLUMN BARIUM ENEMA TECHNIQUE: Initial scout AP supine abdominal image obtained to insure adequate colon cleansing. Barium was introduced into the colon in a retrograde fashion and refluxed from the rectum to the cecum. Spot images of the colon followed by overhead radiographs were obtained. FLUOROSCOPY TIME:  Fluoroscopy Time:  3.24 Radiation Exposure Index (if provided by the fluoroscopic device): 136 mGy Number of Acquired Spot Images: 2 COMPARISON:  None. FINDINGS: Intermittent inserted into the rectum and retention balloon inflated. High-density barium was introduced into the rectum via gravity feed. Contrast flowed into the sigmoid colon. Contrast could not be advanced past the sigmoid colon. 1200 cc was introduced with ultimately leakage around the enema tip and no advancement into the descending colon. No diverticular disease noted in the sigmoid colon. IMPRESSION: 1. High-density therapy barium enema for gastrointestinal bleeding per general surgery request. 2. No evidence of diverticular disease in the sigmoid colon. 3. Contrast could not be advanced past the proximal sigmoid colon. Cannot exclude obstructing mass. Recommend CT of the abdomen and pelvis for further evaluation (recognizing some artifact from the high-density barium). Initial Findings conveyed toJOSE PISCOYA on 12/29/2016  at3:45. Electronically Signed: By: Suzy Bouchard M.D. On: 12/29/2016 07:49    EKG:   Orders placed or performed during the hospital  encounter of 12/27/16  . ED EKG  . ED EKG  . EKG 12-Lead  . EKG 12-Lead    ASSESSMENT AND PLAN:   [name] a 71 y.o.malewith a known history of Diverticular bleed needing vascular embolization 2013, upper GI bleed due to NSAID's in 2015 presents to the emergency room with bright red blood per rectum.   * Bright red blood per rectum - diverticular bleed. Received total of 6 units prbc tx so far - hb at 8.1 - Bleeding has improved now. Added decompensated episodes with hemorrhagic shock and was monitored in ICU at the time. -Bleeding scan was positive then.. Patient underwent micro-bead embolization around the sigmoid colon area - Not on any anticoagulants - Appreciate Surgical input. Pt to get elective colectomy next week unless emergency surgery needed for a large bleed. - started on a clear liquid diet  * Leukocytosis-could be reactive. Her fevers. -Currently on meropenem  * Glaucoma-continue eyedrops.  * DVT prophylaxis-Ted's and SCDs. No anticoagulation due to GI bleed     All the records are reviewed and case discussed with Care Management/Social Workerr. Management plans discussed with the patient, family and they are in agreement.  CODE STATUS: Full code  TOTAL TIME TAKING CARE OF THIS PATIENT: 38 minutes.   POSSIBLE D/C IN 3 DAYS, DEPENDING ON CLINICAL CONDITION.   Gladstone Lighter M.D on 12/30/2016 at 11:40 AM  Between 7am to 6pm - Pager - [phone]  After 6pm go to [email or web address] - password [email or web address] [facility]  Office  [phone]  CC: Primary care physician; Ezequiel Kayser, MD

## 2016-12-31 HISTORY — PX: SIGMOIDECTOMY: SHX176

## 2016-12-31 LAB — BASIC METABOLIC PANEL
ANION GAP: 6 (ref 5–15)
BUN: 9 mg/dL (ref 6–20)
CALCIUM: 8.1 mg/dL — AB (ref 8.9–10.3)
CO2: 24 mmol/L (ref 22–32)
Chloride: 111 mmol/L (ref 101–111)
Creatinine, Ser: 0.7 mg/dL (ref 0.61–1.24)
GFR calc non Af Amer: >60 mL/min (ref >60)
Glucose, Bld: 153 mg/dL — ABNORMAL HIGH (ref 65–99)
Potassium: 3.6 mmol/L (ref 3.5–5.1)
Sodium: 141 mmol/L (ref 135–145)

## 2016-12-31 LAB — CBC
HEMATOCRIT: 20.9 % — AB (ref 40.0–52.0)
HEMOGLOBIN: 7.1 g/dL — AB (ref 13.0–18.0)
MCH: 28.9 pg (ref 26.0–34.0)
MCHC: 34.1 g/dL (ref 32.0–36.0)
MCV: 85 fL (ref 80.0–100.0)
Platelets: 115 10*3/uL — ABNORMAL LOW (ref 150–440)
RBC: 2.46 MIL/uL — ABNORMAL LOW (ref 4.40–5.90)
RDW: 15.7 % — ABNORMAL HIGH (ref 11.5–14.5)
WBC: 21.8 10*3/uL — AB (ref 3.8–10.6)

## 2016-12-31 LAB — MRSA PCR SCREENING: MRSA by PCR: NEGATIVE

## 2016-12-31 LAB — GLUCOSE, CAPILLARY
GLUCOSE-CAPILLARY: 116 mg/dL — AB (ref 65–99)
GLUCOSE-CAPILLARY: 132 mg/dL — AB (ref 65–99)
GLUCOSE-CAPILLARY: 155 mg/dL — AB (ref 65–99)
GLUCOSE-CAPILLARY: 161 mg/dL — AB (ref 65–99)
GLUCOSE-CAPILLARY: 194 mg/dL — AB (ref 65–99)
Glucose-Capillary: 130 mg/dL — ABNORMAL HIGH (ref 65–99)
Glucose-Capillary: 185 mg/dL — ABNORMAL HIGH (ref 65–99)
Glucose-Capillary: 145 mg/dL — ABNORMAL HIGH (ref 65–99)

## 2016-12-31 LAB — HEMOGLOBIN
HEMOGLOBIN: 6 g/dL — AB (ref 13.0–18.0)
HEMOGLOBIN: 8.4 g/dL — AB (ref 13.0–18.0)

## 2016-12-31 LAB — PREPARE RBC (CROSSMATCH)

## 2016-12-31 MED ORDER — SODIUM CHLORIDE 0.9 % IV SOLN
Freq: Once | INTRAVENOUS | Status: AC
  Administered 2016-12-31: 13:00:00 via INTRAVENOUS

## 2016-12-31 MED ORDER — INSULIN ASPART 100 UNIT/ML ~~LOC~~ SOLN
0.0000 [IU] | SUBCUTANEOUS | Status: DC
  Administered 2017-01-01 – 2017-01-03 (×7): 2 [IU] via SUBCUTANEOUS
  Administered 2017-01-03: 3 [IU] via SUBCUTANEOUS
  Administered 2017-01-03: 2 [IU] via SUBCUTANEOUS
  Administered 2017-01-03 – 2017-01-04 (×2): 3 [IU] via SUBCUTANEOUS
  Administered 2017-01-04 (×4): 2 [IU] via SUBCUTANEOUS
  Administered 2017-01-04 – 2017-01-05 (×2): 3 [IU] via SUBCUTANEOUS
  Administered 2017-01-05: 2 [IU] via SUBCUTANEOUS
  Administered 2017-01-05: 3 [IU] via SUBCUTANEOUS
  Administered 2017-01-05: 2 [IU] via SUBCUTANEOUS
  Administered 2017-01-05: 5 [IU] via SUBCUTANEOUS
  Administered 2017-01-06 (×2): 2 [IU] via SUBCUTANEOUS
  Administered 2017-01-06: 3 [IU] via SUBCUTANEOUS
  Administered 2017-01-06: 5 [IU] via SUBCUTANEOUS
  Filled 2016-12-31 (×3): qty 2
  Filled 2016-12-31: qty 5
  Filled 2016-12-31: qty 3
  Filled 2016-12-31 (×3): qty 2
  Filled 2016-12-31 (×2): qty 3
  Filled 2016-12-31 (×4): qty 2
  Filled 2016-12-31: qty 3
  Filled 2016-12-31 (×2): qty 2
  Filled 2016-12-31: qty 5
  Filled 2016-12-31: qty 2
  Filled 2016-12-31: qty 3
  Filled 2016-12-31: qty 2
  Filled 2016-12-31: qty 3
  Filled 2016-12-31: qty 2
  Filled 2016-12-31: qty 3
  Filled 2016-12-31: qty 2

## 2016-12-31 MED ORDER — NEOMYCIN SULFATE 500 MG PO TABS
1000.0000 mg | ORAL_TABLET | ORAL | Status: DC
  Administered 2016-12-31: 1000 mg via ORAL
  Filled 2016-12-31: qty 2

## 2016-12-31 MED ORDER — PEG 3350-KCL-NA BICARB-NACL 420 G PO SOLR
4000.0000 mL | Freq: Once | ORAL | Status: AC
  Administered 2016-12-31: 4000 mL via ORAL
  Filled 2016-12-31 (×2): qty 4000

## 2016-12-31 MED ORDER — ERYTHROMYCIN BASE 250 MG PO TABS
1000.0000 mg | ORAL_TABLET | ORAL | Status: DC
  Administered 2016-12-31: 1000 mg via ORAL
  Filled 2016-12-31: qty 4

## 2016-12-31 MED ORDER — SODIUM CHLORIDE 0.9 % IV SOLN
Freq: Once | INTRAVENOUS | Status: AC
  Administered 2016-12-31: 10:00:00 via INTRAVENOUS

## 2016-12-31 MED ORDER — LACTATED RINGERS IV SOLN
INTRAVENOUS | Status: DC
Start: 2016-12-31 — End: 2017-01-03
  Administered 2016-12-31 – 2017-01-03 (×6): via INTRAVENOUS

## 2016-12-31 NOTE — Progress Notes (Signed)
Unit #1 of 3 has infused.  VS stable.  No S&S of a reaction

## 2016-12-31 NOTE — Progress Notes (Signed)
[facility] NAME: Sargent Gapp    MR#:  [id number]  DATE OF BIRTH:  06-26-1945  SUBJECTIVE:  CHIEF COMPLAINT:   Chief Complaint  Patient presents with  . GI Bleeding   -For more bloody stools overnight. Hemoglobin dropped to 6. -vasovagal episode this morning for 10 seconds, 3 units of blood ordered today. -Planned for elective colectomy tomorrow  REVIEW OF SYSTEMS:  Review of Systems  Constitutional: Positive for malaise/fatigue. Negative for chills and fever.  HENT: Negative for congestion, hearing loss, nosebleeds and tinnitus.   Eyes: Negative for blurred vision and double vision.  Respiratory: Negative for cough, shortness of breath and wheezing.   Cardiovascular: Negative for chest pain, palpitations and leg swelling.  Gastrointestinal: Positive for blood in stool. Negative for abdominal pain, constipation, diarrhea, nausea and vomiting.  Genitourinary: Negative for dysuria and urgency.  Musculoskeletal: Negative for myalgias.  Neurological: Positive for dizziness. Negative for speech change, focal weakness, seizures and headaches.  Psychiatric/Behavioral: Negative for depression.    DRUG ALLERGIES:   Allergies  Allergen Reactions  . Azithromycin Rash  . Penicillins Rash    Has patient had a PCN reaction causing immediate rash, facial/tongue/throat swelling, SOB or lightheadedness with hypotension: Yes Has patient had a PCN reaction causing severe rash involving mucus membranes or skin necrosis: Yes Has patient had a PCN reaction that required hospitalization No Has patient had a PCN reaction occurring within the last 10 years: No If all of the above answers are "NO", then may proceed with Cephalosporin use.   [name] Lisinopril Cough  . Losartan Cough  . Metoprolol Other (See Comments)    Bradycardia.    VITALS:  Blood pressure (!) 123/55, pulse (!) 101, temperature 98.3 F (36.8 C), temperature source Oral,  resp. rate (!) 24, height 6\' 4"  (1.93 m), weight (!) 136.8 kg (301 lb 8 oz), SpO2 100 %.  PHYSICAL EXAMINATION:  Physical Exam  GENERAL:  72 y.o.-year-old patient lying in the bed with no acute distress. Obese male. EYES: Pupils equal, round, reactive to light and accommodation. No scleral icterus. Extraocular muscles intact.  HEENT: Head atraumatic, normocephalic. Oropharynx and nasopharynx clear.  NECK:  Supple, no jugular venous distention. No thyroid enlargement, no tenderness.  LUNGS: Normal breath sounds bilaterally, no wheezing, rales,rhonchi or crepitation. No use of accessory muscles of respiration. Decreased bibasilar breath sounds. CARDIOVASCULAR: S1, S2 normal. No murmurs, rubs, or gallops.  ABDOMEN: Soft, nontender, nondistended. Bowel sounds present. No organomegaly or mass.  EXTREMITIES: Nocyanosis, or clubbing. 1+ edema of all extremities noted NEUROLOGIC: Cranial nerves II through XII are intact. Muscle strength 5/5 in all extremities. Sensation intact. Gait not checked.  PSYCHIATRIC: The patient is alert and oriented x 3.  SKIN: No obvious rash, lesion, or ulcer.    LABORATORY PANEL:   CBC  Recent Labs Lab 12/31/16 0520 12/31/16 0918  WBC 21.8*  --   HGB 7.1* 6.0*  HCT 20.9*  --   PLT 115*  --    ------------------------------------------------------------------------------------------------------------------  Chemistries   Recent Labs Lab 12/27/16 1004  12/31/16 0520  NA 135  < > 141  K 4.1  < > 3.6  CL 102  < > 111  CO2 26  < > 24  GLUCOSE 263*  < > 153*  BUN 15  < > 9  CREATININE 0.94  < > 0.70  CALCIUM 9.7  < > 8.1*  AST 20  --   --  ALT 21  --   --   ALKPHOS 71  --   --   BILITOT 1.1  --   --   < > = values in this interval not displayed. ------------------------------------------------------------------------------------------------------------------  Cardiac Enzymes  Recent Labs Lab 12/27/16 1004  TROPONINI <0.03    ------------------------------------------------------------------------------------------------------------------  RADIOLOGY:  No results found.  EKG:   Orders placed or performed during the hospital encounter of 12/27/16  . ED EKG  . ED EKG  . EKG 12-Lead  . EKG 12-Lead    ASSESSMENT AND PLAN:   [name] a 72 y.o.malewith a known history of Diverticular bleed needing vascular embolization 2013, upper GI bleed due to NSAID's in 2015 presents to the emergency room with bright red blood per rectum.   * Bright red blood per rectum - diverticular bleed. Received total of 6 units prbc tx so far- had 4 more large bloody stools overnight and syncopal episode this AM - Hb dropped again to 6 - 3 units blood ordered for today - if continue to bleed or be symptomatic- will transfer him to ICU -  Patient underwent micro-bead embolization around the sigmoid colon area on admission - Not on any anticoagulants - Appreciate Surgical input. Pt to get elective colectomy tomorrow. - on a clear liquid diet  * Leukocytosis-could be reactive. No fevers. -Currently on meropenem empirically for intra abdominal coverage  * Glaucoma-continue eyedrops.  * DVT prophylaxis-Ted's and SCDs. No anticoagulation due to GI bleed     All the records are reviewed and case discussed with Care Management/Social Workerr. Management plans discussed with the patient, family and they are in agreement.  CODE STATUS: Full code  TOTAL CRITICAL CARE  TIME SPENT IN TAKING CARE OF THIS PATIENT: 39 minutes.   POSSIBLE D/C IN 3 DAYS, DEPENDING ON CLINICAL CONDITION.   Gladstone Lighter M.D on 12/31/2016 at 1:58 PM  Between 7am to 6pm - Pager - [phone]  After 6pm go to [email or web address] - password [email or web address] [facility]  Office  [phone]  CC: Primary care physician; Ezequiel Kayser, MD

## 2016-12-31 NOTE — Progress Notes (Addendum)
Pt. Had 4 bloody bowel movements in the early morning hours, last bloody bowel movement (was only bright red blood no stool present) was large and had large clots, HR increased to 160's, pt. Felt warm and light headed. Pt. Returned to bed, HR decreased to low one hundreds - one teens. Sats in high 90's, Pt. Alert and oriented.  Pt. Cooled off and light headedness resolved. Day nurse in room, full report given to day nurse.

## 2016-12-31 NOTE — Progress Notes (Signed)
[facility] responded to an RR in room 240. Pt was alert and being attended to upon my arrival. Pt (pr nurse) passed out about 15 or 20 seconds while being attended to. CH offered silent prayer and waited until RR was lifted. CH is available for follow up as needed.    12/31/16 1000  Clinical Encounter Type  Visited With Patient;Health care provider  Visit Type Initial;Spiritual support;Code (RR)  Referral From Nurse  Consult/Referral To Chaplain  Spiritual Encounters  Spiritual Needs Prayer

## 2016-12-31 NOTE — Progress Notes (Signed)
12/31/2016  Subjective: Patient continues to have bloody bowel movements and has required multiple transfusions so far during his hospital stay. He had a barium enema done on 12/30 which temporarily improved his bleeding but now continues to progress. This morning the patient reports that he is not feeling too well and had some dizziness and tachycardia when he got up in the morning but after laying in bed he started feeling better.  Vital signs: Temp:  [98.1 F (36.7 C)-98.8 F (37.1 C)] 98.1 F (36.7 C) (01/01 1042) Pulse Rate:  [48-113] 97 (01/01 1043) Resp:  [16-24] 16 (01/01 1042) BP: (96-131)/(47-81) 111/55 (01/01 1043) SpO2:  [78 %-100 %] 100 % (01/01 1043) Weight:  [136.8 kg (301 lb 8 oz)] 136.8 kg (301 lb 8 oz) (01/01 0517)   Intake/Output: 12/31 0701 - 01/01 0700 In: 1140 [P.O.:990; I.V.:100; IV Piggyback:50] Out: 1850 [Urine:1850] Last BM Date: 12/31/16  Physical Exam: Constitutional: No acute distress Cardiac:  Sinus tachycardia to 100-110s Pulm: No respiratory distress Abdomen: Soft, obese, nontender to palpation  Labs:   Recent Labs  12/30/16 0710 12/31/16 0520 12/31/16 0918  WBC 31.4* 21.8*  --   HGB 8.1* 7.1* 6.0*  HCT 24.0* 20.9*  --   PLT 101* 115*  --     Recent Labs  12/30/16 0710 12/31/16 0520  NA 142 141  K 4.1 3.6  CL 115* 111  CO2 25 24  GLUCOSE 139* 153*  BUN 20 9  CREATININE 0.91 0.70  CALCIUM 8.3* 8.1*    Recent Labs  12/28/16 1550  LABPROT 15.7*  INR 1.24    Imaging: No results found.  Assessment/Plan: 72 year old male with lower GI bleed likely diverticular in nature.  -Patient is due to get another unit of red blood cells for transfusion this morning given his continued GI bleed. -I discussed with the patient that currently conservative measures have been exhausted including embolization and barium enema in that he is going to require surgical intervention for his GI bleed. I discussed with the patient that this would  consist of exploratory laparotomy with partial colectomy in an attempt to do a primary anastomosis. The patient does understand that if the bowel does not appear healthy over this any worsening in his vital signs during the surgery that we may proceed with doing only resection and an end colostomy. -We'll do bowel prep today. Patient may continue clear liquid diet today and will be nothing by mouth after midnight.   Melvyn Neth, [place]

## 2016-12-31 NOTE — Progress Notes (Signed)
Flow sheet error.  First 2 units started at a rate of 120/ml for the first 15 minutes, then increased to 160 ml/hr.  Unable to edit the flowsheet to reflect this rate.

## 2016-12-31 NOTE — Progress Notes (Signed)
At [phone] Patient became unresponsive. Skin cold and clammy to touch.  Rapid response called.  Started on O2 [place] at 10 L.  Patient regained consciousness slowly over approx 1 minute.  Said he"felt terrible".  BP stable throughout, HR in the 100-110 range, which has also been stable.  Dr. Tressia Miners called.  She ordered a stat HGB.  I called the blood bank to find out when the RBC would be ready.  They estimate 15 minutes unless he's antibodies are positive.  O2 [place] reduced to 2L.  BP monitoring q 10 minutes.

## 2016-12-31 NOTE — Progress Notes (Signed)
Rapid response was called on pt at 0850, as per Jancy (RN) pt became unresponsive for about 15-20 seconds, never losing pulse or respirations.  Upon arrival at 0854, pt is alert and oriented, no complaints of pain or SOB.  Pt vital signs remain stable, sinus tachycardia 103-107 (which is no change from previous), BP 105/56, 109/56, and 109/52.  O2 saturations 100% on 2L, in no respiratory distress.  Alert and oriented, able to follow simple commands, pupils PERRLA at 3 mm, react briskly.  Pt is here for GI bleed.  Upon arrival to room, Bethel Born (RN) was on phone with Dr. Tressia Miners.  Dr. Tressia Miners gave orders for STAT serum hemoglobin, and pt is to receive 1 unit PRBCs.  As this time will remain on current unit.

## 2016-12-31 NOTE — Progress Notes (Signed)
Had another large bloody stool again Will transfer to step down Ordered blood transfusion

## 2016-12-31 NOTE — Progress Notes (Signed)
Dr. Marcille Blanco made aware of Hg 7.1, no new orders at this time.

## 2017-01-01 ENCOUNTER — Inpatient Hospital Stay: Payer: Medicare Other | Admitting: Anesthesiology

## 2017-01-01 ENCOUNTER — Encounter
Admission: EM | Disposition: A | Discharge status: Home / Self Care | Payer: Self-pay | Source: Home / Self Care | Attending: Internal Medicine

## 2017-01-01 ENCOUNTER — Encounter: Payer: Self-pay | Admitting: Vascular Surgery

## 2017-01-01 DIAGNOSIS — K922 Gastrointestinal hemorrhage, unspecified: Secondary | ICD-10-CM

## 2017-01-01 HISTORY — PX: LAPAROTOMY: SHX154

## 2017-01-01 HISTORY — PX: PARTIAL COLECTOMY: SHX5273

## 2017-01-01 LAB — CBC
HEMATOCRIT: 23.7 % — AB (ref 40.0–52.0)
HEMOGLOBIN: 8 g/dL — AB (ref 13.0–18.0)
MCH: 29.1 pg (ref 26.0–34.0)
MCHC: 33.9 g/dL (ref 32.0–36.0)
MCV: 85.8 fL (ref 80.0–100.0)
Platelets: 120 10*3/uL — ABNORMAL LOW (ref 150–440)
RBC: 2.76 MIL/uL — AB (ref 4.40–5.90)
RDW: 14.9 % — ABNORMAL HIGH (ref 11.5–14.5)
WBC: 21.1 10*3/uL — AB (ref 3.8–10.6)

## 2017-01-01 LAB — BASIC METABOLIC PANEL
Anion gap: 3 — ABNORMAL LOW (ref 5–15)
BUN: 6 mg/dL (ref 6–20)
CALCIUM: 8 mg/dL — AB (ref 8.9–10.3)
CHLORIDE: 108 mmol/L (ref 101–111)
CO2: 27 mmol/L (ref 22–32)
Creatinine, Ser: 0.73 mg/dL (ref 0.61–1.24)
GFR calc non Af Amer: >60 mL/min (ref >60)
Glucose, Bld: 134 mg/dL — ABNORMAL HIGH (ref 65–99)
POTASSIUM: 3.1 mmol/L — AB (ref 3.5–5.1)
Sodium: 138 mmol/L (ref 135–145)

## 2017-01-01 LAB — GLUCOSE, CAPILLARY
Glucose-Capillary: 109 mg/dL — ABNORMAL HIGH (ref 65–99)
Glucose-Capillary: 123 mg/dL — ABNORMAL HIGH (ref 65–99)
Glucose-Capillary: 129 mg/dL — ABNORMAL HIGH (ref 65–99)
Glucose-Capillary: 131 mg/dL — ABNORMAL HIGH (ref 65–99)
Glucose-Capillary: 146 mg/dL — ABNORMAL HIGH (ref 65–99)

## 2017-01-01 LAB — MAGNESIUM: Magnesium: 1.7 mg/dL (ref 1.7–2.4)

## 2017-01-01 LAB — PROTIME-INR
INR: 1.18
PROTHROMBIN TIME: 15.1 s (ref 11.4–15.2)

## 2017-01-01 SURGERY — COLECTOMY, PARTIAL
Anesthesia: General | Wound class: Clean Contaminated

## 2017-01-01 MED ORDER — CLINDAMYCIN PHOSPHATE 900 MG/50ML IV SOLN
INTRAVENOUS | Status: DC | PRN
  Administered 2017-01-01: 900 mg via INTRAVENOUS

## 2017-01-01 MED ORDER — SODIUM CHLORIDE 0.9% FLUSH
9.0000 mL | INTRAVENOUS | Status: DC | PRN
Start: 2017-01-01 — End: 2017-01-02

## 2017-01-01 MED ORDER — ETOMIDATE 2 MG/ML IV SOLN
INTRAVENOUS | Status: DC | PRN
  Administered 2017-01-01: 30 mg via INTRAVENOUS

## 2017-01-01 MED ORDER — DIPHENHYDRAMINE HCL 12.5 MG/5ML PO ELIX
12.5000 mg | ORAL_SOLUTION | Freq: Four times a day (QID) | ORAL | Status: DC | PRN
  Filled 2017-01-01: qty 5

## 2017-01-01 MED ORDER — FENTANYL CITRATE (PF) 250 MCG/5ML IJ SOLN
INTRAMUSCULAR | Status: AC
  Filled 2017-01-01: qty 5

## 2017-01-01 MED ORDER — ACETAMINOPHEN 500 MG PO TABS
1000.0000 mg | ORAL_TABLET | Freq: Four times a day (QID) | ORAL | Status: DC
  Administered 2017-01-01 – 2017-01-06 (×17): 1000 mg via ORAL
  Filled 2017-01-01 (×17): qty 2

## 2017-01-01 MED ORDER — ROCURONIUM BROMIDE 100 MG/10ML IV SOLN
INTRAVENOUS | Status: DC | PRN
  Administered 2017-01-01: 10 mg via INTRAVENOUS
  Administered 2017-01-01: 5 mg via INTRAVENOUS
  Administered 2017-01-01: 10 mg via INTRAVENOUS
  Administered 2017-01-01: 45 mg via INTRAVENOUS
  Administered 2017-01-01: 30 mg via INTRAVENOUS
  Administered 2017-01-01: 10 mg via INTRAVENOUS

## 2017-01-01 MED ORDER — NALOXONE HCL 0.4 MG/ML IJ SOLN: 0.4000 mg | INTRAMUSCULAR | Status: DC | PRN

## 2017-01-01 MED ORDER — FENTANYL CITRATE (PF) 100 MCG/2ML IJ SOLN
INTRAMUSCULAR | Status: AC
  Filled 2017-01-01: qty 2

## 2017-01-01 MED ORDER — ONDANSETRON HCL 4 MG/2ML IJ SOLN
INTRAMUSCULAR | Status: DC | PRN
  Administered 2017-01-01: 4 mg via INTRAVENOUS

## 2017-01-01 MED ORDER — DIPHENHYDRAMINE HCL 50 MG/ML IJ SOLN
12.5000 mg | Freq: Four times a day (QID) | INTRAMUSCULAR | Status: DC | PRN
Start: 2017-01-01 — End: 2017-01-02

## 2017-01-01 MED ORDER — CLINDAMYCIN PHOSPHATE 600 MG/50ML IV SOLN
INTRAVENOUS | Status: AC
  Filled 2017-01-01: qty 100

## 2017-01-01 MED ORDER — BUPIVACAINE LIPOSOME 1.3 % IJ SUSP
INTRAMUSCULAR | Status: AC
  Filled 2017-01-01: qty 20

## 2017-01-01 MED ORDER — PHENYLEPHRINE HCL 10 MG/ML IJ SOLN
INTRAMUSCULAR | Status: DC | PRN
  Administered 2017-01-01 (×2): 100 ug via INTRAVENOUS

## 2017-01-01 MED ORDER — GABAPENTIN 300 MG PO CAPS
300.0000 mg | ORAL_CAPSULE | Freq: Three times a day (TID) | ORAL | Status: DC
  Administered 2017-01-01 – 2017-01-06 (×14): 300 mg via ORAL
  Filled 2017-01-01 (×14): qty 1

## 2017-01-01 MED ORDER — OXYCODONE HCL 5 MG/5ML PO SOLN: 5.0000 mg | Freq: Once | ORAL | Status: DC | PRN

## 2017-01-01 MED ORDER — ONDANSETRON HCL 4 MG/2ML IJ SOLN
INTRAMUSCULAR | Status: AC
  Filled 2017-01-01: qty 2

## 2017-01-01 MED ORDER — HYDROMORPHONE 1 MG/ML IV SOLN: INTRAVENOUS | Status: DC

## 2017-01-01 MED ORDER — BUPIVACAINE HCL 0.25 % IJ SOLN
INTRAMUSCULAR | Status: DC | PRN
  Administered 2017-01-01: 30 mL

## 2017-01-01 MED ORDER — OXYCODONE HCL 5 MG PO TABS: 5.0000 mg | ORAL_TABLET | Freq: Once | ORAL | Status: DC | PRN

## 2017-01-01 MED ORDER — SODIUM CHLORIDE 0.9 % IV SOLN
INTRAVENOUS | Status: DC | PRN
  Administered 2017-01-01: 11:00:00 via INTRAVENOUS

## 2017-01-01 MED ORDER — SUGAMMADEX SODIUM 200 MG/2ML IV SOLN
INTRAVENOUS | Status: DC | PRN
  Administered 2017-01-01: 200 mg via INTRAVENOUS

## 2017-01-01 MED ORDER — SUCCINYLCHOLINE CHLORIDE 200 MG/10ML IV SOSY
PREFILLED_SYRINGE | INTRAVENOUS | Status: AC
  Filled 2017-01-01: qty 10

## 2017-01-01 MED ORDER — LIDOCAINE 2% (20 MG/ML) 5 ML SYRINGE
INTRAMUSCULAR | Status: AC
  Filled 2017-01-01: qty 5

## 2017-01-01 MED ORDER — SODIUM CHLORIDE 0.9 % IV SOLN
INTRAVENOUS | Status: DC | PRN
  Administered 2017-01-01: 60 mL

## 2017-01-01 MED ORDER — PROMETHAZINE HCL 25 MG/ML IJ SOLN: 6.2500 mg | INTRAMUSCULAR | Status: DC | PRN

## 2017-01-01 MED ORDER — HYDROMORPHONE HCL 1 MG/ML IJ SOLN: 0.5000 mg | INTRAMUSCULAR | Status: DC | PRN

## 2017-01-01 MED ORDER — BUPIVACAINE HCL (PF) 0.5 % IJ SOLN
INTRAMUSCULAR | Status: AC
  Filled 2017-01-01: qty 30

## 2017-01-01 MED ORDER — ETOMIDATE 2 MG/ML IV SOLN
INTRAVENOUS | Status: AC
  Filled 2017-01-01: qty 10

## 2017-01-01 MED ORDER — SUCCINYLCHOLINE CHLORIDE 20 MG/ML IJ SOLN
INTRAMUSCULAR | Status: DC | PRN
  Administered 2017-01-01: 100 mg via INTRAVENOUS

## 2017-01-01 MED ORDER — SODIUM CHLORIDE 0.9 % IJ SOLN
INTRAMUSCULAR | Status: AC
  Filled 2017-01-01: qty 50

## 2017-01-01 MED ORDER — FENTANYL CITRATE (PF) 100 MCG/2ML IJ SOLN
INTRAMUSCULAR | Status: DC | PRN
  Administered 2017-01-01 (×3): 50 ug via INTRAVENOUS
  Administered 2017-01-01: 100 ug via INTRAVENOUS
  Administered 2017-01-01: 50 ug via INTRAVENOUS

## 2017-01-01 MED ORDER — MEPERIDINE HCL 25 MG/ML IJ SOLN: 6.2500 mg | INTRAMUSCULAR | Status: DC | PRN

## 2017-01-01 MED ORDER — ROCURONIUM BROMIDE 50 MG/5ML IV SOSY
PREFILLED_SYRINGE | INTRAVENOUS | Status: AC
  Filled 2017-01-01: qty 10

## 2017-01-01 MED ORDER — ROCURONIUM BROMIDE 50 MG/5ML IV SOSY
PREFILLED_SYRINGE | INTRAVENOUS | Status: AC
  Filled 2017-01-01: qty 5

## 2017-01-01 MED ORDER — LIDOCAINE HCL (CARDIAC) 20 MG/ML IV SOLN
INTRAVENOUS | Status: DC | PRN
  Administered 2017-01-01: 100 mg via INTRAVENOUS

## 2017-01-01 MED ORDER — SODIUM CHLORIDE 0.9 % IV SOLN
INTRAVENOUS | Status: DC | PRN
  Administered 2017-01-01: 50 ug/min via INTRAVENOUS

## 2017-01-01 MED ORDER — FENTANYL CITRATE (PF) 100 MCG/2ML IJ SOLN
25.0000 ug | INTRAMUSCULAR | Status: DC | PRN
  Administered 2017-01-01 (×2): 50 ug via INTRAVENOUS

## 2017-01-01 SURGICAL SUPPLY — 42 items
CANISTER SUCT 1200ML W/VALVE (MISCELLANEOUS) ×4 IMPLANT
CATH TRAY 16F METER LATEX (MISCELLANEOUS) ×4 IMPLANT
CHLORAPREP W/TINT 26ML (MISCELLANEOUS) ×8 IMPLANT
DRAPE LAPAROTOMY 100X77 ABD (DRAPES) ×4 IMPLANT
DRAPE LEGGINS SURG 28X43 STRL (DRAPES) ×4 IMPLANT
DRSG OPSITE POSTOP 4X12 (GAUZE/BANDAGES/DRESSINGS) ×4 IMPLANT
ELECT EZSTD 165MM 6.5IN (MISCELLANEOUS)
ELECT REM PT RETURN 9FT ADLT (ELECTROSURGICAL) ×4
ELECTRODE EZSTD 165MM 6.5IN (MISCELLANEOUS) IMPLANT
ELECTRODE REM PT RTRN 9FT ADLT (ELECTROSURGICAL) ×2 IMPLANT
GAUZE SPONGE 4X4 12PLY STRL (GAUZE/BANDAGES/DRESSINGS) IMPLANT
GLOVE BIO SURGEON STRL SZ7 (GLOVE) ×16 IMPLANT
GOWN STRL REUS W/ TWL LRG LVL3 (GOWN DISPOSABLE) ×8 IMPLANT
GOWN STRL REUS W/TWL LRG LVL3 (GOWN DISPOSABLE) ×8
KIT RM TURNOVER STRD PROC AR (KITS) ×4 IMPLANT
LABEL OR SOLS (LABEL) ×4 IMPLANT
LIGASURE IMPACT 36 18CM CVD LR (INSTRUMENTS) ×4 IMPLANT
NDL SAFETY 22GX1.5 (NEEDLE) ×4 IMPLANT
NDL SAFETY ECLIPSE 18X1.5 (NEEDLE) ×2 IMPLANT
NEEDLE HYPO 18GX1.5 SHARP (NEEDLE) ×2
NEEDLE HYPO 25X1 1.5 SAFETY (NEEDLE) ×4 IMPLANT
NS IRRIG 1000ML POUR BTL (IV SOLUTION) ×4 IMPLANT
PACK BASIN MAJOR ARMC (MISCELLANEOUS) ×4 IMPLANT
PACK COLON CLEAN CLOSURE (MISCELLANEOUS) ×4 IMPLANT
SPONGE LAP 18X18 5 PK (GAUZE/BANDAGES/DRESSINGS) ×8 IMPLANT
STAPLER CUT CVD 40MM BLUE (STAPLE) ×4 IMPLANT
STAPLER ENDO ILS CVD 18 33 (STAPLE) ×4 IMPLANT
STAPLER PROXIMATE 75MM BLUE (STAPLE) ×4 IMPLANT
STAPLER SKIN PROX 35W (STAPLE) ×4 IMPLANT
STAPLER SYS INTERNAL RELOAD SS (MISCELLANEOUS) ×4 IMPLANT
SUT ETHIBOND 3-0  EXTR (SUTURE) ×4
SUT ETHIBOND 3-0 EXTR (SUTURE) ×4 IMPLANT
SUT PDS AB 0 CT1 27 (SUTURE) ×4 IMPLANT
SUT PDS AB 1 TP1 96 (SUTURE) ×8 IMPLANT
SUT PROLENE 2 0 SH DA (SUTURE) ×4 IMPLANT
SUT SILK 2 0 (SUTURE) ×2
SUT SILK 2 0SH CR/8 30 (SUTURE) ×4 IMPLANT
SUT SILK 2-0 18XBRD TIE 12 (SUTURE) ×2 IMPLANT
SUT VIC AB 3-0 SH 27 (SUTURE) ×2
SUT VIC AB 3-0 SH 27X BRD (SUTURE) ×2 IMPLANT
SYR 20CC LL (SYRINGE) ×4 IMPLANT
SYR 50ML LL SCALE MARK (SYRINGE) ×4 IMPLANT

## 2017-01-01 NOTE — Progress Notes (Signed)
CCMD notified this nurse that patient had missed beat that looked like possible 2nd degree HB type 2. Strip saved and reviewed. Dr. Marcille Blanco notified, this was only occurrence, heart rate 90's- low 100's, patient has no complaints. No new orders. Wilnette Kales

## 2017-01-01 NOTE — Brief Op Note (Signed)
12/27/2016 - 01/01/2017  3:00 PM  PATIENT:  Evan [name]  72 y.o. male  PRE-OPERATIVE DIAGNOSIS:  Acute lower GI bleeding  POST-OPERATIVE DIAGNOSIS:  Acute lower GI bleeding  PROCEDURE:  Procedure(s): PARTIAL COLECTOMY (N/A) EXPLORATORY LAPAROTOMY  SURGEON:  Surgeon(s) and Role:    * Olean Ree, MD - Primary  PHYSICIAN ASSISTANT:   ASSISTANTS: Dr. Nestor Lewandowsky  ANESTHESIA:   general  EBL:  Total I/O In: 1999.8 [I.V.:1669.8; Blood:330] Out: 950 [Urine:800; Blood:150]  BLOOD ADMINISTERED:1 unit PRBC  DRAINS: Urinary Catheter (Foley)   LOCAL MEDICATIONS USED:  BUPIVICAINE   SPECIMEN:  Sigmoid colon, distal descending colon margin, anastomotic donuts  DISPOSITION OF SPECIMEN:  PATHOLOGY  COUNTS:  YES  TOURNIQUET:  * No tourniquets in log *  DICTATION: .Dragon Dictation  PLAN OF CARE: Admit to inpatient   PATIENT DISPOSITION:  ICU - extubated and stable.   Delay start of Pharmacological VTE agent (>24hrs) due to surgical blood loss or risk of bleeding: yes

## 2017-01-01 NOTE — Transfer of Care (Signed)
Immediate Anesthesia Transfer of Care Note  Patient: Evan [name]  Procedure(s) Performed: Procedure(s): PARTIAL COLECTOMY (N/A) EXPLORATORY LAPAROTOMY  Patient Location: PACU  Anesthesia Type:General  Level of Consciousness: sedated  Airway & Oxygen Therapy: Patient Spontanous Breathing and Patient connected to face mask oxygen  Post-op Assessment: Report given to RN and Post -op Vital signs reviewed and stable  Post vital signs: Reviewed and stable  Last Vitals:  Vitals:   01/01/17 1000 01/01/17 1502  BP: 132/62 (!) 148/72  Pulse: 87 87  Resp: (!) 22 19  Temp:  123XX123 C    Complications: No apparent anesthesia complications

## 2017-01-01 NOTE — Op Note (Signed)
Procedure Date:  01/01/2017  Pre-operative Diagnosis:  Acute lower GI bleed  Post-operative Diagnosis: Acute lower GI bleed  Procedure:  Exploratory laparotomy, sigmoid colectomy with primary anastomosis  Surgeon:  Melvyn Neth, MD  Assistant:  Nestor Lewandowsky, MD  Anesthesia:  General endotracheal  Estimated Blood Loss:  150 ml  Specimens:  Sigmoid colon, distal descending colon, anastomotic donuts  Complications:  None  Indications for Procedure:  This is a 72 y.o. male admitted on 12/27/16 with acute lower GI bleed that has been persistent despite of conservative measures including blood products, embolization, and barium enema. The risks of bleeding, abscess or infection, injury to surrounding structures, requiring an ostomy, and need for further procedures were all discussed with the patient and was willing to proceed.  Description of Procedure: The patient was correctly identified in the preoperative area and brought into the operating room.  The patient was placed supine with VTE prophylaxis in place.  Appropriate time-outs were performed.  Anesthesia was induced and the patient was intubated.  Foley catheter was placed.  Appropriate antibiotics were infused.  The patient was placed in low lithotomy position.  The abdomen was prepped and draped in a sterile fashion.  The perineum and anal canal were prepped with betadine.  A lower midline incision extending from top of umbilicus down to suprapubic region was made and electrocautery was used to dissect down the subcutaneous tissue to the fascia.  The fascia was incised and extended superiorly and inferiorly.  The abdominal cavity was explored and there was no evidence of intra-abdominal bleeding or abscess.  Attention was then focused on the left lower quadrant and the sigmoid colon was identified.  It was somewhat redundant.  Distally at the rectosigmoid junction, the colon was very erythematous with some wall thickening,  consistent with the area of hemorrhage identified on nuclear medicine scan.  The segment measured approximately 10 cm.  The sigmoid colon was then mobilized by dissecting along the white line of Toldt proximally and distally.  The left ureter and gonadal vessels were identified and preserved.  The sigmoid was mobilized distally toward the rectum beyond the area of erythema and thickness.  The mesentery of the sigmoid and mesorectum were dissected with the LigaSure device with appropriate hemostasis.  A section for our proximal margin was identified and stapled across with a linear GIA 75 mm blue load.  Distally, the colon was stapled with a Contour blue load stapler. The distal staple line was intact and the corners imbricated using 3-0 Ethibond sutures.  Proximally, it was noted that the last 3 cm of the descending colon were ischemic, so a new area for stapling was identified.  The purse-string stapler was used and a new proximal margin created.  It was sized to measure 33 mm, and the 33 mm circular stapler was opened.  The anvil was placed inside the proximal margin and the suture tied, but this broke upon tying, so a new purse-string was created using 2-0 prolene, securing the anvil in place without further issues.  For the anastomosis, the anal canal was dilated and the 33 mm stapler introduced transrectally.  The spike was deployed, coming out in the center of our staple line.  The proximal and distal margins were connected, making sure the colon was straight and not twisted and under no tension.  The stapler was deployed and retrieved appropriately without issues.  The anastomotic donuts were intact. The rigid sigmoidoscope was used to perform an insufflation leak test, which  was negative and there were no bubbles.  The staple line was clean and intact upon further visualization.    The abdomen was thoroughly irrigated.  Exparel solution and 0.5% Marcaine were infused into the fascia and subcutaneous  tissues.  The fascia was closed using loop PDS sutures.  The midline wound was irrigated and closed using staples.  The wound was dressed with gauze and tegaderm.   The patient was emerged from anesthesia and extubated and brought to the recovery room for further management.  The patient tolerated the procedure well and all counts were correct at the end of the case.   Melvyn Neth, MD

## 2017-01-01 NOTE — Anesthesia Preprocedure Evaluation (Signed)
Anesthesia Evaluation  Patient identified by MRN, date of birth, ID band Patient awake    Reviewed: Allergy & Precautions, NPO status , Patient's Chart, lab work & pertinent test results  History of Anesthesia Complications Negative for: history of anesthetic complications  Airway Mallampati: III  TM Distance: >3 FB Neck ROM: Full    Dental no notable dental hx.    Pulmonary sleep apnea , neg COPD, former smoker,    breath sounds clear to auscultation- rhonchi (-) wheezing      Cardiovascular Exercise Tolerance: Good hypertension, Pt. on medications +CHF (preserved EF)  (-) CAD and (-) Past MI + dysrhythmias (hx of afib s/p ablation) Atrial Fibrillation  Rhythm:Regular Rate:Normal - Systolic murmurs and - Diastolic murmurs    Neuro/Psych negative neurological ROS  negative psych ROS   GI/Hepatic negative GI ROS, Neg liver ROS,   Endo/Other  diabetes, Type 2, Oral Hypoglycemic Agents  Renal/GU negative Renal ROS     Musculoskeletal negative musculoskeletal ROS (+)   Abdominal (+) + obese,   Peds  Hematology negative hematology ROS (+)   Anesthesia Other Findings Past Medical History: No date: Atrial fibrillation (HCC) No date: BPH (benign prostatic hyperplasia) No date: CHF (congestive heart failure) (HCC) No date: Diabetes mellitus without complication (HCC) No date: Diverticulosis No date: Hyperlipidemia No date: Hypertension No date: Sleep apnea   Reproductive/Obstetrics                             Anesthesia Physical Anesthesia Plan  ASA: III  Anesthesia Plan: General   Post-op Pain Management:    Induction: Intravenous  Airway Management Planned: Oral ETT  Additional Equipment:   Intra-op Plan:   Post-operative Plan: Extubation in OR  Informed Consent: I have reviewed the patients History and Physical, chart, labs and discussed the procedure including the risks,  benefits and alternatives for the proposed anesthesia with the patient or authorized representative who has indicated his/her understanding and acceptance.   Dental advisory given  Plan Discussed with: CRNA and Anesthesiologist  Anesthesia Plan Comments:         Anesthesia Quick Evaluation

## 2017-01-01 NOTE — Anesthesia Procedure Notes (Signed)
Procedure Name: Intubation Performed by: Lance Muss Pre-anesthesia Checklist: Patient identified, Patient being monitored, Timeout performed, Emergency Drugs available and Suction available Patient Re-evaluated:Patient Re-evaluated prior to inductionOxygen Delivery Method: Circle system utilized Preoxygenation: Pre-oxygenation with 100% oxygen Intubation Type: IV induction Ventilation: Mask ventilation without difficulty Laryngoscope Size: Mac and 4 Grade View: Grade I Tube type: Oral Tube size: 7.5 mm Number of attempts: 1 Airway Equipment and Method: Stylet Placement Confirmation: ETT inserted through vocal cords under direct vision,  positive ETCO2 and breath sounds checked- equal and bilateral Secured at: 24 cm Tube secured with: Tape Dental Injury: Teeth and Oropharynx as per pre-operative assessment

## 2017-01-01 NOTE — Progress Notes (Signed)
Patient has not required PCA pump during first shift. Dr. Adonis Huguenin notified, and received order for PRN dilaudid push when patient is not on PCA, if PCA is started do not give PRN push. Patient only complains of soreness at this time, will continue to assess need for pain medication.  Evan [name]

## 2017-01-01 NOTE — Progress Notes (Signed)
Blood unit was completed at 1650. No signs of complications.

## 2017-01-01 NOTE — Progress Notes (Signed)
01/01/2017  Subjective: Patient received 3 units of red blood cells yesterday for ongoing lower GI bleed. He denies any dizziness or lightheadedness this morning. He did tolerate his bowel prep yesterday for surgery today  Vital signs: Temp:  [98.1 F (36.7 C)-99.6 F (37.6 C)] 99.6 F (37.6 C) (01/02 0400) Pulse Rate:  [48-120] 108 (01/02 0700) Resp:  [16-29] 21 (01/02 0700) BP: (83-129)/(47-98) 116/74 (01/02 0700) SpO2:  [78 %-100 %] 97 % (01/02 0700) Weight:  [135.7 kg (299 lb 2.6 oz)-136.9 kg (301 lb 13 oz)] 136.9 kg (301 lb 13 oz) (01/02 0500)   Intake/Output: 01/01 0701 - 01/02 0700 In: 5573.3 [P.O.:500; I.V.:2913.3; Blood:1610; IV Piggyback:50] Out: 1400 [Urine:1250; Stool:150] Last BM Date: 12/31/16  Physical Exam: Constitutional: No acute distress Cardiac:  Sinus tachycardia Pulm: No respiratory distress lungs are clear Abdomen: Soft, obese, nondistended, nontender to palpation  Labs:   Recent Labs  12/31/16 0520  12/31/16 2007 01/01/17 0555  WBC 21.8*  --   --  21.1*  HGB 7.1*  < > 8.4* 8.0*  HCT 20.9*  --   --  23.7*  PLT 115*  --   --  120*  < > = values in this interval not displayed.  Recent Labs  12/31/16 0520 01/01/17 0555  NA 141 138  K 3.6 3.1*  CL 111 108  CO2 24 27  GLUCOSE 153* 134*  BUN 9 6  CREATININE 0.70 0.73  CALCIUM 8.1* 8.0*    Recent Labs  01/01/17 0555  LABPROT 15.1  INR 1.18    Imaging: No results found.  Assessment/Plan: 72 year old male with ongoing lower GI bleed.  -We'll take patient to the OR today for exploratory laparotomy with partial colectomy and possible primary anastomosis versus ostomy. Risks and benefits of the procedure were explained to the patient he is willing to proceed   Melvyn Neth, [place]

## 2017-01-01 NOTE — Progress Notes (Signed)
[facility] NAME: Evan [name]    MR#:  [id number]  DATE OF BIRTH:  24-Mar-1945  SUBJECTIVE:  CHIEF COMPLAINT:   Chief Complaint  Patient presents with  . GI Bleeding   Had sigmoid colectomy today  REVIEW OF SYSTEMS:  Review of Systems  Constitutional: Positive for malaise/fatigue. Negative for chills and fever.  HENT: Negative for congestion, hearing loss, nosebleeds and tinnitus.   Eyes: Negative for blurred vision and double vision.  Respiratory: Negative for cough, shortness of breath and wheezing.   Cardiovascular: Negative for chest pain, palpitations and leg swelling.  Gastrointestinal: Positive for blood in stool. Negative for abdominal pain, constipation, diarrhea, nausea and vomiting.  Genitourinary: Negative for dysuria and urgency.  Musculoskeletal: Negative for myalgias.  Neurological: Positive for dizziness. Negative for speech change, focal weakness, seizures and headaches.  Psychiatric/Behavioral: Negative for depression.    DRUG ALLERGIES:   Allergies  Allergen Reactions  . Azithromycin Rash  . Penicillins Rash    Has patient had a PCN reaction causing immediate rash, facial/tongue/throat swelling, SOB or lightheadedness with hypotension: Yes Has patient had a PCN reaction causing severe rash involving mucus membranes or skin necrosis: Yes Has patient had a PCN reaction that required hospitalization No Has patient had a PCN reaction occurring within the last 10 years: No If all of the above answers are "NO", then may proceed with Cephalosporin use.   [name] Lisinopril Cough  . Losartan Cough  . Metoprolol Other (See Comments)    Bradycardia.    VITALS:  Blood pressure 139/68, pulse 91, temperature 98.9 F (37.2 C), temperature source Oral, resp. rate (!) 25, height 6\' 3"  (1.905 m), weight (!) 136.9 kg (301 lb 13 oz), SpO2 (!) 89 %.  PHYSICAL EXAMINATION:  Physical Exam  GENERAL:  72 y.o.-year-old  patient lying in the bed with no acute distress. Obese male. EYES: Pupils equal, round, reactive to light and accommodation. No scleral icterus. Extraocular muscles intact.  HEENT: Head atraumatic, normocephalic. Oropharynx and nasopharynx clear.  NECK:  Supple, no jugular venous distention. No thyroid enlargement, no tenderness.  LUNGS: Normal breath sounds bilaterally, no wheezing, rales,rhonchi or crepitation. No use of accessory muscles of respiration. Decreased bibasilar breath sounds. CARDIOVASCULAR: S1, S2 normal. No murmurs, rubs, or gallops.  ABDOMEN: sugical dressing EXTREMITIES: Nocyanosis, or clubbing. 1+ edema of all extremities noted NEUROLOGIC: Cranial nerves II through XII are intact. Muscle strength 5/5 in all extremities. Sensation intact. Gait not checked.  PSYCHIATRIC: The patient is alert and oriented x 3.  SKIN: No obvious rash, lesion, or ulcer.    LABORATORY PANEL:   CBC  Recent Labs Lab 01/01/17 0555  WBC 21.1*  HGB 8.0*  HCT 23.7*  PLT 120*   ------------------------------------------------------------------------------------------------------------------  Chemistries   Recent Labs Lab 12/27/16 1004  01/01/17 0555  NA 135  < > 138  K 4.1  < > 3.1*  CL 102  < > 108  CO2 26  < > 27  GLUCOSE 263*  < > 134*  BUN 15  < > 6  CREATININE 0.94  < > 0.73  CALCIUM 9.7  < > 8.0*  MG  --   --  1.7  AST 20  --   --   ALT 21  --   --   ALKPHOS 71  --   --   BILITOT 1.1  --   --   < > = values in this interval not displayed. ------------------------------------------------------------------------------------------------------------------  Cardiac Enzymes  Recent Labs Lab 12/27/16 1004  TROPONINI <0.03   ------------------------------------------------------------------------------------------------------------------  RADIOLOGY:  No results found.  EKG:   Orders placed or performed during the hospital encounter of 12/27/16  . ED EKG  . ED EKG    . EKG 12-Lead  . EKG 12-Lead    ASSESSMENT AND PLAN:   [name] a 72 y.o.malewith a known history of Diverticular bleed needing vascular embolization 2013, upper GI bleed due to NSAID's in 2015 presents to the emergency room with bright red blood per rectum.   * Bright red blood per rectum - diverticular bleed. Received total of 8 units prbc tx so far S/p sigmoid colectomy today. Getting 1 unit blood today. ICU for tonight and transfer out if stable in AM  * Leukocytosis-could be reactive. No fevers. -Currently on meropenem empirically for intra abdominal coverage  * DVT prophylaxis-Ted's and SCDs. No anticoagulation due to GI bleed   All the records are reviewed and case discussed with Care Management/Social Workerr. Management plans discussed with the patient, family and they are in agreement.  CODE STATUS: Full code  TOTAL TIME SPENT IN TAKING CARE OF THIS PATIENT: 25 minutes.   POSSIBLE D/C IN 2-3 DAYS, DEPENDING ON CLINICAL CONDITION.   Hillary Bow R M.D on 01/01/2017 at 4:59 PM  Between 7am to 6pm - Pager - [phone]  After 6pm go to [email or web address] - password [email or web address] [facility]  Office  [phone]  CC: Primary care physician; Ezequiel Kayser, MD

## 2017-01-02 ENCOUNTER — Encounter: Payer: Self-pay | Admitting: Surgery

## 2017-01-02 LAB — CBC WITH DIFFERENTIAL/PLATELET
BASOS PCT: 0 %
Basophils Absolute: 0 10*3/uL (ref 0–0.1)
Eosinophils Absolute: 0.4 10*3/uL (ref 0–0.7)
Eosinophils Relative: 2 %
HCT: 24.4 % — ABNORMAL LOW (ref 40.0–52.0)
Hemoglobin: 8.2 g/dL — ABNORMAL LOW (ref 13.0–18.0)
LYMPHS ABS: 1.6 10*3/uL (ref 1.0–3.6)
LYMPHS PCT: 9 %
MCH: 28.8 pg (ref 26.0–34.0)
MCHC: 33.4 g/dL (ref 32.0–36.0)
MCV: 86 fL (ref 80.0–100.0)
MONO ABS: 1.8 10*3/uL — AB (ref 0.2–1.0)
Monocytes Relative: 10 %
NEUTROS ABS: 14.2 10*3/uL — AB (ref 1.4–6.5)
NRBC: 7 /100{WBCs} — AB
Neutrophils Relative %: 79 %
PLATELETS: 139 10*3/uL — AB (ref 150–440)
RBC: 2.84 MIL/uL — ABNORMAL LOW (ref 4.40–5.90)
RDW: 15.1 % — ABNORMAL HIGH (ref 11.5–14.5)
WBC: 18 10*3/uL — ABNORMAL HIGH (ref 3.8–10.6)

## 2017-01-02 LAB — BASIC METABOLIC PANEL
Anion gap: 3 — ABNORMAL LOW (ref 5–15)
CO2: 29 mmol/L (ref 22–32)
CREATININE: 0.6 mg/dL — AB (ref 0.61–1.24)
Calcium: 7.8 mg/dL — ABNORMAL LOW (ref 8.9–10.3)
Chloride: 109 mmol/L (ref 101–111)
GFR calc Af Amer: >60 mL/min (ref >60)
GLUCOSE: 123 mg/dL — AB (ref 65–99)
POTASSIUM: 3.1 mmol/L — AB (ref 3.5–5.1)
Sodium: 141 mmol/L (ref 135–145)

## 2017-01-02 LAB — TYPE AND SCREEN
ABO/RH(D): B POS
ANTIBODY SCREEN: NEGATIVE
UNIT DIVISION: 0
UNIT DIVISION: 0
UNIT DIVISION: 0
UNIT DIVISION: 0
Unit division: 0
Unit division: 0
Unit division: 0
Unit division: 0
Unit division: 0

## 2017-01-02 LAB — GLUCOSE, CAPILLARY
GLUCOSE-CAPILLARY: 111 mg/dL — AB (ref 65–99)
GLUCOSE-CAPILLARY: 149 mg/dL — AB (ref 65–99)
Glucose-Capillary: 129 mg/dL — ABNORMAL HIGH (ref 65–99)
Glucose-Capillary: 104 mg/dL — ABNORMAL HIGH (ref 65–99)
Glucose-Capillary: 104 mg/dL — ABNORMAL HIGH (ref 65–99)
Glucose-Capillary: 130 mg/dL — ABNORMAL HIGH (ref 65–99)

## 2017-01-02 LAB — PATHOLOGIST SMEAR REVIEW

## 2017-01-02 LAB — DAT, POLYSPECIFIC AHG (ARMC ONLY): Polyspecific AHG test: NEGATIVE

## 2017-01-02 MED ORDER — POTASSIUM CHLORIDE 20 MEQ/15ML (10%) PO SOLN
40.0000 meq | Freq: Once | ORAL | Status: AC
  Administered 2017-01-02: 40 meq via ORAL
  Filled 2017-01-02: qty 30

## 2017-01-02 MED ORDER — SODIUM CHLORIDE 0.9 % IV SOLN
30.0000 meq | Freq: Once | INTRAVENOUS | Status: AC
  Administered 2017-01-02: 30 meq via INTRAVENOUS
  Filled 2017-01-02: qty 15

## 2017-01-02 MED ORDER — ZOLPIDEM TARTRATE 5 MG PO TABS
5.0000 mg | ORAL_TABLET | Freq: Once | ORAL | Status: AC
  Administered 2017-01-02: 5 mg via ORAL
  Filled 2017-01-02: qty 1

## 2017-01-02 NOTE — Anesthesia Postprocedure Evaluation (Signed)
Anesthesia Post Note  Patient: Evan [name]  Procedure(s) Performed: Procedure(s) (LRB): PARTIAL COLECTOMY (N/A) EXPLORATORY LAPAROTOMY  Patient location during evaluation: ICU Anesthesia Type: General Level of consciousness: awake, awake and alert and oriented Pain management: pain level controlled Vital Signs Assessment: post-procedure vital signs reviewed and stable Respiratory status: spontaneous breathing Cardiovascular status: blood pressure returned to baseline Postop Assessment: no headache, no backache and no signs of nausea or vomiting Anesthetic complications: no     Last Vitals:  Vitals:   01/02/17 0630 01/02/17 0700  BP: 132/61 121/60  Pulse: (!) 107 82  Resp: (!) 21 (!) 22  Temp:      Last Pain:  Vitals:   01/02/17 0400  TempSrc: Oral  PainSc:                  Hedda Slade

## 2017-01-02 NOTE — Progress Notes (Signed)
01/02/2017  Subjective: Patient is 1 Day Post-Op status post expiratory laparotomy with sigmoid colectomy and primary anastomosis. No acute events overnight. No bloody bowel movements. Hemoglobin has remained stable this morning. Patient reports some soreness over the abdominal incision but no significant pain.  Vital signs: Temp:  [97.8 F (36.6 C)-99.6 F (37.6 C)] 99 F (37.2 C) (01/03 0800) Pulse Rate:  [76-118] 106 (01/03 1100) Resp:  [16-26] 25 (01/03 1100) BP: (102-148)/(54-86) 113/66 (01/03 1100) SpO2:  [89 %-100 %] 91 % (01/03 1100) Weight:  [137.7 kg (303 lb 9.2 oz)] 137.7 kg (303 lb 9.2 oz) (01/03 0500)   Intake/Output: 01/02 0701 - 01/03 0700 In: 3259.8 [I.V.:2829.8; Blood:330; IV Piggyback:100] Out: 1690 [Urine:1540; Blood:150] Last BM Date: 01/01/17  Physical Exam: Constitutional: No acute distress Cardiac:  Sinus tachycardia Pulm: No respiratory distress, lungs clear. Abdomen: Soft, nondistended, appropriately tender to palpation. Midline incision with staples clean dry and intact with dressing with some serosanguineous staining but no active drainage.  Labs:   Recent Labs  01/01/17 0555 01/02/17 0438  WBC 21.1* 18.0*  HGB 8.0* 8.2*  HCT 23.7* 24.4*  PLT 120* 139*    Recent Labs  01/01/17 0555 01/02/17 0438  NA 138 141  K 3.1* 3.1*  CL 108 109  CO2 27 29  GLUCOSE 134* 123*  BUN 6 <5*  CREATININE 0.73 0.60*  CALCIUM 8.0* 7.8*    Recent Labs  01/01/17 0555  LABPROT 15.1  INR 1.18    Imaging: No results found.  Assessment/Plan: 72 year old male status post exploratory laparotomy with sigmoidectomy and primary anastomosis.  -We'll start sips and ice chips today and continue IV fluids for hydration. -Continue PCA, Tylenol, Neurontin for pain control. May be able to discontinue the PCA later today. -Discontinue Foley catheter. -Out of bed, ambulate. Physical therapy consult -Hold off on DVT prophylaxis for now given recent GI bleed. Should  be able to start tomorrow. -From a surgical standpoint, patient may be transferred out of ICU to regular floor.   Melvyn Neth, [place]

## 2017-01-02 NOTE — Evaluation (Signed)
Physical Therapy Evaluation Patient Details Name: Evan [name] MRN: [id number] DOB: Nov 05, 1945 Today's Date: 01/02/2017   History of Present Illness  72 yo male with onset of GI bleed and due to recurrence had sigmoid colectomy 01/02/16, laparascopy, anastomosis.  Home with family previously.  PMHx:  a-fib, CHF, DM, HLD, HTN, sleep apnea, atrial ablation  Clinical Impression  Pt is demonstrating willingness to work with a good pivot standing transfer to chair after being up bedside.  He is interested in getting directly home since he has recently been regularly exercising at a gym (prior to GI bleed).  Will follow acutely to get pt home with gait and balance exercises, strengthening to LE's as tolerated with care to avoid stressing abdominal area.    Follow Up Recommendations Home health PT;Supervision for mobility/OOB    Equipment Recommendations  None recommended by PT    Recommendations for Other Services       Precautions / Restrictions Precautions Precautions: Fall (telemetry) Restrictions Weight Bearing Restrictions: No      Mobility  Bed Mobility Overal bed mobility: Needs Assistance Bed Mobility: Supine to Sit     Supine to sit: Min assist;Mod assist     General bed mobility comments: helping pt as he has recent abdominal surgery  Transfers Overall transfer level: Needs assistance Equipment used: 2 person hand held assist Transfers: Sit to/from [facility] Sit to Stand: Mod assist;+2 physical assistance;+2 safety/equipment Stand pivot transfers: Mod assist;Min assist;+2 physical assistance;+2 safety/equipment       General transfer comment: cued hand placement  Ambulation/Gait             General Gait Details: up to chair only  Stairs            Wheelchair Mobility    Modified Rankin (Stroke Patients Only)       Balance Overall balance assessment: Needs assistance Sitting-balance support: Feet supported Sitting  balance-Leahy Scale: Good     Standing balance support: Bilateral upper extremity supported Standing balance-Leahy Scale: Poor                               Pertinent Vitals/Pain Pain Assessment: No/denies pain    Home Living Family/patient expects to be discharged to:: Private residence Living Arrangements: Spouse/significant other Available Help at Discharge: Family;Available 24 hours/day Type of Home: House Home Access: Stairs to enter Entrance Stairs-Rails: None (step is big enough to fit a walker on it) Entrance Stairs-Number of Steps: 1 Home Layout: One level Home Equipment: Walker - 2 wheels;Cane - single point      Prior Function Level of Independence: Independent         Comments: went to gym recently until GI bleed started     Hand Dominance        Extremity/Trunk Assessment   Upper Extremity Assessment Upper Extremity Assessment: Overall WFL for tasks assessed    Lower Extremity Assessment Lower Extremity Assessment: Overall WFL for tasks assessed    Cervical / Trunk Assessment Cervical / Trunk Assessment: Normal  Communication   Communication: No difficulties  Cognition Arousal/Alertness: Awake/alert Behavior During Therapy: WFL for tasks assessed/performed Overall Cognitive Status: Within Functional Limits for tasks assessed                      General Comments General comments (skin integrity, edema, etc.): pt was assisted to drop arm chair and was off telemetry after transition and pulse 103,  sat94%.      Exercises     Assessment/Plan    PT Assessment Patient needs continued PT services  PT Problem List Decreased strength;Decreased range of motion;Decreased activity tolerance;Decreased balance;Decreased mobility;Decreased coordination;Decreased knowledge of use of DME;Decreased safety awareness;Decreased knowledge of precautions;Cardiopulmonary status limiting activity;Obesity;Decreased skin integrity          PT  Treatment Interventions DME instruction;Stair training;Gait training;Functional mobility training;Therapeutic activities;Therapeutic exercise;Balance training;Neuromuscular re-education;Patient/family education    PT Goals (Current goals can be found in the Care Plan section)  Acute Rehab PT Goals Patient Stated Goal: to get right home when able PT Goal Formulation: With patient/family Time For Goal Achievement: 01/16/17 Potential to Achieve Goals: Good    Frequency Min 2X/week   Barriers to discharge Inaccessible home environment step with entrance of house    Co-evaluation               End of Session Equipment Utilized During Treatment: Gait belt Activity Tolerance: Patient tolerated treatment well;No increased pain Patient left: in chair;with call bell/phone within reach;with nursing/sitter in room;with family/visitor present Nurse Communication: Mobility status         Time: [id number] PT Time Calculation (min) (ACUTE ONLY): 24 min   Charges:   PT Evaluation $PT Eval Moderate Complexity: 1 Procedure PT Treatments $Therapeutic Activity: 8-22 mins   PT G Codes:        Ramond Dial January 28, 2017, 3:31 PM   Mee Hives, PT MS Acute Rehab Dept. Number: [place] [facility] [place]

## 2017-01-02 NOTE — Progress Notes (Signed)
Evan [name] NAME: Evan [name]    MR#:  [id number]  DATE OF BIRTH:  [birth year]  SUBJECTIVE:  CHIEF COMPLAINT:   Chief Complaint  Patient presents with  . GI Bleeding   Admitted for GI bleed. Had micro-embolization with GI bleed stopping for a day and then recurrent bleed. Transfused blood. Had sigmoid colectomy 01/02/2016  No further bleeding today. Feels well other than some soreness in his belly.  REVIEW OF SYSTEMS:  Review of Systems  Constitutional: Positive for malaise/fatigue. Negative for chills and fever.  HENT: Negative for congestion, hearing loss, nosebleeds and tinnitus.   Eyes: Negative for blurred vision and double vision.  Respiratory: Negative for cough, shortness of breath and wheezing.   Cardiovascular: Negative for chest pain, palpitations and leg swelling.  Gastrointestinal: Positive for blood in stool. Negative for abdominal pain, constipation, diarrhea, nausea and vomiting.  Genitourinary: Negative for dysuria and urgency.  Musculoskeletal: Negative for myalgias.  Neurological: Positive for dizziness. Negative for speech change, focal weakness, seizures and headaches.  Psychiatric/Behavioral: Negative for depression.    DRUG ALLERGIES:   Allergies  Allergen Reactions  . Azithromycin Rash  . Penicillins Rash    Has patient had a PCN reaction causing immediate rash, facial/tongue/throat swelling, SOB or lightheadedness with hypotension: Yes Has patient had a PCN reaction causing severe rash involving mucus membranes or skin necrosis: Yes Has patient had a PCN reaction that required hospitalization No Has patient had a PCN reaction occurring within the last 10 years: No If all of the above answers are "NO", then may proceed with Cephalosporin use.   [name] Lisinopril Cough  . Losartan Cough  . Metoprolol Other (See Comments)    Bradycardia.    VITALS:  Blood pressure 125/81, pulse 100, temperature  99 F (37.2 C), temperature source Oral, resp. rate (!) 22, height 6\' 3"  (1.905 m), weight (!) 137.7 kg (303 lb 9.2 oz), SpO2 90 %.  PHYSICAL EXAMINATION:  Physical Exam  GENERAL:  72 y.o.-year-old patient lying in the bed with no acute distress. Obese male. EYES: Pupils equal, round, reactive to light and accommodation. No scleral icterus. Extraocular muscles intact.  HEENT: Head atraumatic, normocephalic. Oropharynx and nasopharynx clear.  NECK:  Supple, no jugular venous distention. No thyroid enlargement, no tenderness.  LUNGS: Normal breath sounds bilaterally, no wheezing, rales,rhonchi or crepitation. No use of accessory muscles of respiration. Decreased bibasilar breath sounds. CARDIOVASCULAR: S1, S2 normal. No murmurs, rubs, or gallops.  ABDOMEN: sugical dressing EXTREMITIES: Nocyanosis, or clubbing. 1+ edema of all extremities noted NEUROLOGIC: Cranial nerves II through XII are intact. Muscle strength 5/5 in all extremities. Sensation intact. Gait not checked.  PSYCHIATRIC: The patient is alert and oriented x 3.  SKIN: No obvious rash, lesion, or ulcer.    LABORATORY PANEL:   CBC  Recent Labs Lab 01/02/17 0438  WBC 18.0*  HGB 8.2*  HCT 24.4*  PLT 139*   ------------------------------------------------------------------------------------------------------------------  Chemistries   Recent Labs Lab 12/27/16 1004  01/01/17 0555 01/02/17 0438  NA 135  < > 138 141  K 4.1  < > 3.1* 3.1*  CL 102  < > 108 109  CO2 26  < > 27 29  GLUCOSE 263*  < > 134* 123*  BUN 15  < > 6 <5*  CREATININE 0.94  < > 0.73 0.60*  CALCIUM 9.7  < > 8.0* 7.8*  MG  --   --  1.7  --  AST 20  --   --   --   ALT 21  --   --   --   ALKPHOS 71  --   --   --   BILITOT 1.1  --   --   --   < > = values in this interval not displayed. ------------------------------------------------------------------------------------------------------------------  Cardiac Enzymes  Recent Labs Lab  12/27/16 1004  TROPONINI <0.03   ------------------------------------------------------------------------------------------------------------------  RADIOLOGY:  No results found.  EKG:   Orders placed or performed during the hospital encounter of 12/27/16  . ED EKG  . ED EKG  . EKG 12-Lead  . EKG 12-Lead    ASSESSMENT AND PLAN:   [name] a 72 y.o.malewith a known history of Diverticular bleed needing vascular embolization 2013, upper GI bleed due to NSAID's in 2015 presents to the emergency room with bright red blood per rectum.   * Bright red blood per rectum - diverticular bleed. Received total of 9 units prbc S/p sigmoid colectomy 01/02/2016 Started on ice chips by surgery. We'll transfer to surgical floor.  * Leukocytosis-could be reactive. No fevers. -Currently on meropenem empirically for intra abdominal coverage  * DVT prophylaxis-Ted's and SCDs. No anticoagulation due to GI bleed   All the records are reviewed and case discussed with Care Management/Social Workerr. Management plans discussed with the patient, family and they are in agreement.  CODE STATUS: Full code  TOTAL TIME SPENT IN TAKING CARE OF THIS PATIENT: 25 minutes.   POSSIBLE D/C IN 2-3 DAYS, DEPENDING ON CLINICAL CONDITION.   Hillary Bow R M.D on 01/02/2017 at 1:11 PM  Between 7am to 6pm - Pager - [phone]  After 6pm go to [email or web address] - password [email or web address] [facility]  Office  [phone]  CC: Primary care physician; Ezequiel Kayser, MD

## 2017-01-03 LAB — CULTURE, BLOOD (ROUTINE X 2)
Culture: NO GROWTH
Culture: NO GROWTH

## 2017-01-03 LAB — TYPE AND SCREEN
BLOOD PRODUCT EXPIRATION DATE: 201801182359
BLOOD PRODUCT EXPIRATION DATE: 201801202359
BLOOD PRODUCT EXPIRATION DATE: 201801252359
Blood Product Expiration Date: 201801162359
Blood Product Expiration Date: 201801232359
Blood Product Expiration Date: 201801252359
ISSUE DATE / TIME: 201801010950
ISSUE DATE / TIME: 201801011301
ISSUE DATE / TIME: 201801011635
ISSUE DATE / TIME: 201801011709
ISSUE DATE / TIME: 201801021402
UNIT TYPE AND RH: 7300
UNIT TYPE AND RH: 7300
UNIT TYPE AND RH: 7300
Unit Type and Rh: 1700
Unit Type and Rh: 5100
Unit Type and Rh: 7300

## 2017-01-03 LAB — GLUCOSE, CAPILLARY
GLUCOSE-CAPILLARY: 138 mg/dL — AB (ref 65–99)
GLUCOSE-CAPILLARY: 157 mg/dL — AB (ref 65–99)
GLUCOSE-CAPILLARY: 162 mg/dL — AB (ref 65–99)
Glucose-Capillary: 104 mg/dL — ABNORMAL HIGH (ref 65–99)
Glucose-Capillary: 158 mg/dL — ABNORMAL HIGH (ref 65–99)
Glucose-Capillary: 137 mg/dL — ABNORMAL HIGH (ref 65–99)

## 2017-01-03 LAB — CBC WITH DIFFERENTIAL/PLATELET
BAND NEUTROPHILS: 0 %
BASOS ABS: 0 10*3/uL (ref 0–0.1)
BASOS PCT: 0 %
BLASTS: 0 %
EOS ABS: 0.4 10*3/uL (ref 0–0.7)
Eosinophils Relative: 2 %
HEMATOCRIT: 18.9 % — AB (ref 40.0–52.0)
HEMOGLOBIN: 6.2 g/dL — AB (ref 13.0–18.0)
Lymphocytes Relative: 17 %
Lymphs Abs: 3 10*3/uL (ref 1.0–3.6)
MCH: 28.7 pg (ref 26.0–34.0)
MCHC: 32.7 g/dL (ref 32.0–36.0)
MCV: 87.7 fL (ref 80.0–100.0)
METAMYELOCYTES PCT: 0 %
MONO ABS: 0.4 10*3/uL (ref 0.2–1.0)
MYELOCYTES: 0 %
Monocytes Relative: 2 %
Neutro Abs: 13.7 10*3/uL — ABNORMAL HIGH (ref 1.4–6.5)
Neutrophils Relative %: 79 %
Other: 0 %
PROMYELOCYTES ABS: 0 %
Platelets: 160 10*3/uL (ref 150–440)
RBC: 2.15 MIL/uL — ABNORMAL LOW (ref 4.40–5.90)
RDW: 15.1 % — ABNORMAL HIGH (ref 11.5–14.5)
WBC: 17.5 10*3/uL — ABNORMAL HIGH (ref 3.8–10.6)
nRBC: 14 /100 WBC — ABNORMAL HIGH

## 2017-01-03 LAB — PREPARE RBC (CROSSMATCH)

## 2017-01-03 LAB — BASIC METABOLIC PANEL
ANION GAP: 5 (ref 5–15)
BUN: 8 mg/dL (ref 6–20)
CO2: 27 mmol/L (ref 22–32)
CREATININE: 0.77 mg/dL (ref 0.61–1.24)
Calcium: 7.7 mg/dL — ABNORMAL LOW (ref 8.9–10.3)
Chloride: 108 mmol/L (ref 101–111)
GFR calc non Af Amer: >60 mL/min (ref >60)
Glucose, Bld: 128 mg/dL — ABNORMAL HIGH (ref 65–99)
POTASSIUM: 3.5 mmol/L (ref 3.5–5.1)
SODIUM: 140 mmol/L (ref 135–145)

## 2017-01-03 LAB — HEMOGLOBIN AND HEMATOCRIT, BLOOD
HCT: 21.8 % — ABNORMAL LOW (ref 40.0–52.0)
HCT: 23.2 % — ABNORMAL LOW (ref 40.0–52.0)
HEMOGLOBIN: 7.3 g/dL — AB (ref 13.0–18.0)
Hemoglobin: 7.7 g/dL — ABNORMAL LOW (ref 13.0–18.0)

## 2017-01-03 MED ORDER — BOOST / RESOURCE BREEZE PO LIQD
1.0000 | Freq: Three times a day (TID) | ORAL | Status: DC
  Administered 2017-01-03 – 2017-01-06 (×7): 1 via ORAL

## 2017-01-03 MED ORDER — SODIUM CHLORIDE 0.9 % IV SOLN: Freq: Once | INTRAVENOUS | Status: DC

## 2017-01-03 NOTE — Progress Notes (Addendum)
01/03/2017  Subjective: Patient is 2 Days Post-Op status post exploratory laparotomy with sigmoidectomy and primary anastomosis. The patient's hemoglobin this morning has gone down to 6.2 although he has been on IV fluids. Has not had any episodes of hemodynamic instability and has maintained his blood pressure. He did report having dark brown stool overnight but no bright red blood which is what he was having prior to surgery. Denies any abdominal pain. Denies any fevers, chills, nausea, vomiting  Vital signs: Temp:  [97.5 F (36.4 C)-99.3 F (37.4 C)] 99.3 F (37.4 C) (01/04 0519) Pulse Rate:  [99-128] 115 (01/04 0519) Resp:  [19-26] 22 (01/04 0519) BP: (110-132)/(62-87) 117/62 (01/04 0519) SpO2:  [90 %-98 %] 93 % (01/04 0519)   Intake/Output: 01/03 0701 - 01/04 0700 In: 2427 [P.O.:260; I.V.:1752; IV Piggyback:415] Out: 600 [Urine:600] Last BM Date: 12/31/16  Physical Exam: Constitutional: No acute distress Cardiac:  Sinus tachycardia Pulm: Lungs are clear bilaterally Abdomen: Soft, nondistended, minimally tender to palpation. Patient's midline incision is clean dry and intact with staples. Dressing has been removed.  Labs:   Recent Labs  01/02/17 0438 01/03/17 0441  WBC 18.0* 17.5*  HGB 8.2* 6.2*  HCT 24.4* 18.9*  PLT 139* 160    Recent Labs  01/02/17 0438 01/03/17 0441  NA 141 140  K 3.1* 3.5  CL 109 108  CO2 29 27  GLUCOSE 123* 128*  BUN <5* 8  CREATININE 0.60* 0.77  CALCIUM 7.8* 7.7*    Recent Labs  01/01/17 0555  LABPROT 15.1  INR 1.18    Imaging: No results found.  Assessment/Plan: 72 year old male status post exploratory laparotomy with sigmoidectomy.  -Patient will be transfused 2 units of packed red blood cells. Despite of the decrease in hemoglobin there is low suspicion of active GI bleed given that the stool he had was dark brown rather bright red compared to before surgery. This may be more residual from the surgery itself. -We'll start  the patient on a clear liquid diet. -Discontinue IV fluids. -Continue ambulation and working with physical therapy as tolerated.   Melvyn Neth, [place]

## 2017-01-03 NOTE — Progress Notes (Signed)
Dr. Hampton Abbot was notified of pt having bright red in his stool. No new orders given at this time. Will continue to monitor closely.   Cathi Hazan [employer]

## 2017-01-03 NOTE — Progress Notes (Signed)
Evan [name] at [place] NAME: Evan [name]    MR#:  [id number]  DATE OF BIRTH:  August 20, 1945  SUBJECTIVE:  CHIEF COMPLAINT:   Chief Complaint  Patient presents with  . GI Bleeding   Admitted for GI bleed. Had micro-embolization with GI bleed stopping for a day and then recurrent bleed. Transfused blood. Had sigmoid colectomy 01/02/2016  No abd pain. Dark stool. No blood  REVIEW OF SYSTEMS:  Review of Systems  Constitutional: Positive for malaise/fatigue. Negative for chills and fever.  HENT: Negative for congestion, hearing loss, nosebleeds and tinnitus.   Eyes: Negative for blurred vision and double vision.  Respiratory: Negative for cough, shortness of breath and wheezing.   Cardiovascular: Negative for chest pain, palpitations and leg swelling.  Gastrointestinal: Positive for blood in stool. Negative for abdominal pain, constipation, diarrhea, nausea and vomiting.  Genitourinary: Negative for dysuria and urgency.  Musculoskeletal: Negative for myalgias.  Neurological: Positive for dizziness. Negative for speech change, focal weakness, seizures and headaches.  Psychiatric/Behavioral: Negative for depression.    DRUG ALLERGIES:   Allergies  Allergen Reactions  . Azithromycin Rash  . Penicillins Rash    Has patient had a PCN reaction causing immediate rash, facial/tongue/throat swelling, SOB or lightheadedness with hypotension: Yes Has patient had a PCN reaction causing severe rash involving mucus membranes or skin necrosis: Yes Has patient had a PCN reaction that required hospitalization No Has patient had a PCN reaction occurring within the last 10 years: No If all of the above answers are "NO", then may proceed with Cephalosporin use.   [name] Lisinopril Cough  . Losartan Cough  . Metoprolol Other (See Comments)    Bradycardia.    VITALS:  Blood pressure 116/69, pulse (!) 115, temperature 98.5 F (36.9 C), temperature source  Oral, resp. rate 19, height 6\' 3"  (1.905 m), weight (!) 137.7 kg (303 lb 9.2 oz), SpO2 90 %.  PHYSICAL EXAMINATION:  Physical Exam  GENERAL:  72 y.o.-year-old patient lying in the bed with no acute distress. Obese male. EYES: Pupils equal, round, reactive to light and accommodation. No scleral icterus. Extraocular muscles intact.  HEENT: Head atraumatic, normocephalic. Oropharynx and nasopharynx clear.  NECK:  Supple, no jugular venous distention. No thyroid enlargement, no tenderness.  LUNGS: Normal breath sounds bilaterally, no wheezing, rales,rhonchi or crepitation. No use of accessory muscles of respiration. Decreased bibasilar breath sounds. CARDIOVASCULAR: S1, S2 normal. No murmurs, rubs, or gallops.  ABDOMEN: sugical dressing EXTREMITIES: Nocyanosis, or clubbing. 1+ edema of all extremities noted NEUROLOGIC: Cranial nerves II through XII are intact. Muscle strength 5/5 in all extremities. Sensation intact. Gait not checked.  PSYCHIATRIC: The patient is alert and oriented x 3.  SKIN: No obvious rash, lesion, or ulcer.    LABORATORY PANEL:   CBC  Recent Labs Lab 01/03/17 0441  WBC 17.5*  HGB 6.2*  HCT 18.9*  PLT 160   ------------------------------------------------------------------------------------------------------------------  Chemistries   Recent Labs Lab 01/01/17 0555  01/03/17 0441  NA 138  < > 140  K 3.1*  < > 3.5  CL 108  < > 108  CO2 27  < > 27  GLUCOSE 134*  < > 128*  BUN 6  < > 8  CREATININE 0.73  < > 0.77  CALCIUM 8.0*  < > 7.7*  MG 1.7  --   --   < > = values in this interval not displayed. ------------------------------------------------------------------------------------------------------------------  Cardiac Enzymes No results for input(s): TROPONINI in  the last 168 hours. ------------------------------------------------------------------------------------------------------------------  RADIOLOGY:  No results found.  EKG:   Orders  placed or performed during the hospital encounter of 12/27/16  . ED EKG  . ED EKG  . EKG 12-Lead  . EKG 12-Lead    ASSESSMENT AND PLAN:   [name] a 72 y.o.malewith a known history of Diverticular bleed needing vascular embolization 2013, upper GI bleed due to NSAID's in 2015 presents to the emergency room with bright red blood per rectum.   * Bright red blood per rectum - diverticular bleed. Received total of 9 units prbc S/p sigmoid colectomy 01/02/2016 Started on ice chips by surgery.  Had dark stool overnight. No bright blood 2 units Transfuse today due to drop in hb  * Leukocytosis-could be reactive. No fevers. - Stop meropenem  * DVT prophylaxis-Ted's and SCDs. No anticoagulation due to GI bleed   All the records are reviewed and case discussed with Care Management/Social Workerr. Management plans discussed with the patient, family and they are in agreement.  CODE STATUS: Full code  TOTAL TIME SPENT IN TAKING CARE OF THIS PATIENT: 25 minutes.   POSSIBLE D/C IN 2-3 DAYS, DEPENDING ON CLINICAL CONDITION.  Evan [name] R M.D on 01/03/2017 at 1:58 PM  Between 7am to 6pm - Pager - [phone]  After 6pm go to [email or web address] - password [email or web address] [facility]  Office  [phone]  CC: Primary care physician; Evan Kayser, MD

## 2017-01-03 NOTE — Progress Notes (Signed)
Notified dr.vacchanni of pt hgb yesterday 8.2 and this am 6.2. Acknowledged and stated he would put in orders.

## 2017-01-03 NOTE — Progress Notes (Signed)
Hb 6.2- transfuse 2 unit PRBC

## 2017-01-03 NOTE — Progress Notes (Signed)
Initial Nutrition Assessment  DOCUMENTATION CODES:   Obesity unspecified  INTERVENTION:  Diet advancement per surgery.  Provide Boost Breeze po TID, each supplement provides 250 kcal and 9 grams of protein.  Continue multivitamin with minerals daily.  NUTRITION DIAGNOSIS:   Inadequate oral intake related to inability to eat as evidenced by NPO status, other (see comment) (NPO/CLD for 7 days.).  GOAL:   Patient will meet greater than or equal to 90% of their needs  MONITOR:   PO intake, Supplement acceptance, Diet advancement, Labs, Weight trends, I & O's  REASON FOR ASSESSMENT:   NPO/Clear Liquid Diet    ASSESSMENT:   72 y.o.malewith a known history of HTN, Dm, CHF, Diverticular bleed needing vascular embolization 2013, upper GI bleed due to NSAID's in 2015 presented to the emergency room with bright red blood per rectum. Patient now s/p exploratory laparotomy with sigmoidectomy and primary anastomosis on 01/01/2017.    Spoke with patient and family members at baseline. He was advanced to clear liquid diet today and reports he has tolerated well so far. Patient reports he is not hungry now, but reports good appetite PTA. He would typically eat 3 meals daily at home with snacks between. Denies N/V or abdominal pain. Patient is having bowel movements.   Reports UBW 313 lbs when diagnosed with diabetes and then lost 11 lbs (4% body weight) intentionally through eating healthier, which is not significant for time frame.   Medications reviewed and include: Vitamin D 2000 units daily, Novolog sliding scale Q4hrs, multivitamin with minerals daily, Lovaza daily.  Labs reviewed: CBG 104-158 past 24 hrs.  Nutrition-Focused physical exam completed. Findings are no fat depletion, no muscle depletion, and mild edema.   Discussed with RN.   Diet Order:  Diet clear liquid Room service appropriate? Yes; Fluid consistency: Thin  Skin:  Reviewed, no issues  Last BM:   01/03/2017  Height:   Ht Readings from Last 1 Encounters:  12/31/16 6\' 3"  (1.905 m)    Weight:   Wt Readings from Last 1 Encounters:  01/02/17 (!) 303 lb 9.2 oz (137.7 kg)    Ideal Body Weight:  89.1 kg  BMI:  Body mass index is 37.94 kg/m.  Estimated Nutritional Needs:   Kcal:  2200-2400  Protein:  140-165 grams  Fluid:  2.2 L/day  EDUCATION NEEDS:   No education needs identified at this time  Willey Blade, MS, RD, LDN Pager: [phone] After Hours Pager: [phone]

## 2017-01-04 ENCOUNTER — Inpatient Hospital Stay: Payer: Medicare Other

## 2017-01-04 DIAGNOSIS — K922 Gastrointestinal hemorrhage, unspecified: Secondary | ICD-10-CM

## 2017-01-04 LAB — CBC WITH DIFFERENTIAL/PLATELET
BASOS ABS: 0 10*3/uL (ref 0–0.1)
BLASTS: 0 %
Band Neutrophils: 1 %
Basophils Relative: 0 %
Eosinophils Absolute: 0 10*3/uL (ref 0–0.7)
Eosinophils Relative: 0 %
HEMATOCRIT: 23.1 % — AB (ref 40.0–52.0)
Hemoglobin: 7.8 g/dL — ABNORMAL LOW (ref 13.0–18.0)
LYMPHS PCT: 10 %
Lymphs Abs: 1.6 10*3/uL (ref 1.0–3.6)
MCH: 29 pg (ref 26.0–34.0)
MCHC: 33.6 g/dL (ref 32.0–36.0)
MCV: 86.3 fL (ref 80.0–100.0)
MONOS PCT: 5 %
Metamyelocytes Relative: 3 %
Monocytes Absolute: 0.8 10*3/uL (ref 0.2–1.0)
Myelocytes: 0 %
NEUTROS ABS: 13.4 10*3/uL — AB (ref 1.4–6.5)
NEUTROS PCT: 81 %
OTHER: 0 %
Platelets: 192 10*3/uL (ref 150–440)
Promyelocytes Absolute: 0 %
RBC: 2.68 MIL/uL — AB (ref 4.40–5.90)
RDW: 16.5 % — AB (ref 11.5–14.5)
WBC: 15.8 10*3/uL — AB (ref 3.8–10.6)
nRBC: 11 /100 WBC — ABNORMAL HIGH

## 2017-01-04 LAB — TYPE AND SCREEN
ABO/RH(D): B POS
Antibody Screen: NEGATIVE
Unit division: 0
Unit division: 0
Unit division: 0

## 2017-01-04 LAB — GLUCOSE, CAPILLARY
GLUCOSE-CAPILLARY: 129 mg/dL — AB (ref 65–99)
GLUCOSE-CAPILLARY: 200 mg/dL — AB (ref 65–99)
GLUCOSE-CAPILLARY: 143 mg/dL — AB (ref 65–99)
GLUCOSE-CAPILLARY: 132 mg/dL — AB (ref 65–99)
Glucose-Capillary: 121 mg/dL — ABNORMAL HIGH (ref 65–99)

## 2017-01-04 LAB — BASIC METABOLIC PANEL
ANION GAP: 4 — AB (ref 5–15)
BUN: 8 mg/dL (ref 6–20)
CALCIUM: 7.8 mg/dL — AB (ref 8.9–10.3)
CO2: 27 mmol/L (ref 22–32)
Chloride: 108 mmol/L (ref 101–111)
Creatinine, Ser: 0.65 mg/dL (ref 0.61–1.24)
GFR calc Af Amer: >60 mL/min (ref >60)
GFR calc non Af Amer: >60 mL/min (ref >60)
GLUCOSE: 105 mg/dL — AB (ref 65–99)
Potassium: 3.8 mmol/L (ref 3.5–5.1)
Sodium: 139 mmol/L (ref 135–145)

## 2017-01-04 LAB — MAGNESIUM: Magnesium: 1.8 mg/dL (ref 1.7–2.4)

## 2017-01-04 LAB — SURGICAL PATHOLOGY

## 2017-01-04 MED ORDER — OXYCODONE HCL 5 MG PO TABS: 5.0000 mg | ORAL_TABLET | ORAL | Status: DC | PRN

## 2017-01-04 MED ORDER — FUROSEMIDE 20 MG PO TABS
20.0000 mg | ORAL_TABLET | Freq: Once | ORAL | Status: AC
  Administered 2017-01-04: 20 mg via ORAL
  Filled 2017-01-04: qty 1

## 2017-01-04 MED ORDER — FERROUS SULFATE 325 (65 FE) MG PO TABS
325.0000 mg | ORAL_TABLET | Freq: Every day | ORAL | Status: DC
  Administered 2017-01-05: 325 mg via ORAL
  Filled 2017-01-04: qty 1

## 2017-01-04 NOTE — Progress Notes (Addendum)
RN notified Dr. Darvin Neighbours of pt having blurry vision and having "hot flashes".Vital Signs were taken and temp. 98.9, BP 105/62, pulse 94, O2 91 on room air.  His heart rate would go to 140's then dropped to the 80's, while pt was laying in the bed. New orders given to place pt on nasal cannula 2L. Will monitor pt closely.   Annleigh Knueppel [employer]

## 2017-01-04 NOTE — Progress Notes (Signed)
01/04/2017  Subjective: Patient is 3 Days Post-Op s/p exploratory laparotomy and sigmoidectomy for lower GI bleed.  Patient has had some bloody bowel movements, but per patient and his wife, have been with only small amount of blood, not like before surgery.  His last bowel movement had only streaks per nursing.  He did receive 2 units of pRBC yesterday and throughout the 3 checks yesterday evening, around midnight, and this morning, his Hgb has been stable.  Denies any significant abdominal pain.  Tolerated clears well yesterday and is passing flatus as well.  Vital signs: Temp:  [98 F (36.7 C)-99.1 F (37.3 C)] 98.9 F (37.2 C) (01/05 0933) Pulse Rate:  [94-121] 94 (01/05 0933) Resp:  [16-22] 18 (01/05 0933) BP: (105-139)/(57-79) 105/62 (01/05 0933) SpO2:  [90 %-97 %] 97 % (01/05 0933)   Intake/Output: 01/04 0701 - 01/05 0700 In: 1900 [P.O.:1200; I.V.:20; Blood:680] Out: 578 [Urine:575; Stool:3] Last BM Date: 01/05/16  Physical Exam: Constitutional:  No acute distress Abdomen:  Soft, non-distended, obese, non-tender to palpation.  Incision is clean, dry, and intact with staples.  There's mild serosanguinous staining of dressing, but no active oozing or bleeding.  Labs:   Recent Labs  01/03/17 0441  01/03/17 2335 01/04/17 0429  WBC 17.5*  --   --  15.8*  HGB 6.2*  < > 7.3* 7.8*  HCT 18.9*  < > 21.8* 23.1*  PLT 160  --   --  192  < > = values in this interval not displayed.  Recent Labs  01/03/17 0441 01/04/17 0429  NA 140 139  K 3.5 3.8  CL 108 108  CO2 27 27  GLUCOSE 128* 105*  BUN 8 8  CREATININE 0.77 0.65  CALCIUM 7.7* 7.8*   No results for input(s): LABPROT, INR in the last 72 hours.  Imaging: No results found.  Assessment/Plan: 72 yo male s/p exploratory laparotomy with sigmoidectomy  --Advancing diet to soft this morning. --Reassuring that the patient is only having minimal amount of blood in stool.  His Hgb has been stable x 3 checks.  Will check  again tomorrow. --Will start iron today to help with his anemia. --Continue ambulation, OOB --Possible discharge to home with PT over the weekend pending his continued progress.   Melvyn Neth, [place]

## 2017-01-04 NOTE — Progress Notes (Signed)
[place] at [place] NAME: Evan [name]    MR#:  [id number]  DATE OF BIRTH:  05-27-1945  SUBJECTIVE:  CHIEF COMPLAINT:   Chief Complaint  Patient presents with  . GI Bleeding   Admitted for GI bleed. Had micro-embolization with GI bleed stopping for a day and then recurrent bleed. Transfused blood. Had sigmoid colectomy 01/02/2016  No abd pain. Streaks of blood with minimal stool yesterday.  Complain of some blurry vision. Paged by nurse and saw the patient immediately.  REVIEW OF SYSTEMS:  Review of Systems  Constitutional: Positive for malaise/fatigue. Negative for chills and fever.  HENT: Negative for congestion, hearing loss, nosebleeds and tinnitus.   Eyes: Negative for blurred vision and double vision.  Respiratory: Negative for cough, shortness of breath and wheezing.   Cardiovascular: Negative for chest pain, palpitations and leg swelling.  Gastrointestinal: Positive for blood in stool. Negative for abdominal pain, constipation, diarrhea, nausea and vomiting.  Genitourinary: Negative for dysuria and urgency.  Musculoskeletal: Negative for myalgias.  Neurological: Positive for dizziness. Negative for speech change, focal weakness, seizures and headaches.  Psychiatric/Behavioral: Negative for depression.    DRUG ALLERGIES:   Allergies  Allergen Reactions  . Azithromycin Rash  . Penicillins Rash    Has patient had a PCN reaction causing immediate rash, facial/tongue/throat swelling, SOB or lightheadedness with hypotension: Yes Has patient had a PCN reaction causing severe rash involving mucus membranes or skin necrosis: Yes Has patient had a PCN reaction that required hospitalization No Has patient had a PCN reaction occurring within the last 10 years: No If all of the above answers are "NO", then may proceed with Cephalosporin use.   [name] Lisinopril Cough  . Losartan Cough  . Metoprolol Other (See Comments)   Bradycardia.    VITALS:  Blood pressure 105/62, pulse 94, temperature 98.9 F (37.2 C), temperature source Oral, resp. rate 18, height 6\' 3"  (1.905 m), weight (!) 137.7 kg (303 lb 9.2 oz), SpO2 91 %.  PHYSICAL EXAMINATION:  Physical Exam  GENERAL:  72 y.o.-year-old patient lying in the bed with no acute distress. Obese male. EYES: Pupils equal, round, reactive to light and accommodation. No scleral icterus. Extraocular muscles intact.  HEENT: Head atraumatic, normocephalic. Oropharynx and nasopharynx clear.  NECK:  Supple, no jugular venous distention. No thyroid enlargement, no tenderness.  LUNGS: Normal work of breathing. Bibasilar crackles. CARDIOVASCULAR: S1, S2 normal. No murmurs, rubs, or gallops.  ABDOMEN: sugical dressing EXTREMITIES: Nocyanosis, or clubbing. 1+ edema of all extremities noted NEUROLOGIC: Cranial nerves II through XII are intact. Muscle strength 5/5 in all extremities. Sensation intact. Gait not checked.  PSYCHIATRIC: The patient is alert and oriented x 3.  SKIN: No obvious rash, lesion, or ulcer.    LABORATORY PANEL:   CBC  Recent Labs Lab 01/04/17 0429  WBC 15.8*  HGB 7.8*  HCT 23.1*  PLT 192   ------------------------------------------------------------------------------------------------------------------  Chemistries   Recent Labs Lab 01/04/17 0429  NA 139  K 3.8  CL 108  CO2 27  GLUCOSE 105*  BUN 8  CREATININE 0.65  CALCIUM 7.8*  MG 1.8   ------------------------------------------------------------------------------------------------------------------  Cardiac Enzymes No results for input(s): TROPONINI in the last 168 hours. ------------------------------------------------------------------------------------------------------------------  RADIOLOGY:  No results found.  EKG:   Orders placed or performed during the hospital encounter of 12/27/16  . ED EKG  . ED EKG  . EKG 12-Lead  . EKG 12-Lead    ASSESSMENT AND PLAN:  [name] a 72 y.o.malewith a known history of Diverticular bleed needing vascular embolization 2013, upper GI bleed due to NSAID's in 2015 presents to the emergency room with bright red blood per rectum.   * Hypoxia Patient likely has developed pulmonary edema due to multiple blood transfusions. Or atelectasis. We will check a chest x-ray. May need Lasix.  * Blurry vision Likely due to mild hypoxia. Corrected on its own.  * Bright red blood per rectum - diverticular bleed. Received total of 9 units prbc S/p sigmoid colectomy 01/02/2016 Advance to soft diet today No further blood noticed. Had streaks of blood yesterday.  * Leukocytosis-could be reactive. No fevers. Improving - Stopped meropenem  * DVT prophylaxis-Ted's and SCDs. No anticoagulation due to GI bleed   All the records are reviewed and case discussed with Care Management/Social Workerr. Management plans discussed with the patient, family and they are in agreement.  CODE STATUS: Full code  TOTAL TIME SPENT IN TAKING CARE OF THIS PATIENT: 30 minutes.   POSSIBLE D/C IN 2-3 DAYS, DEPENDING ON CLINICAL CONDITION.  Hillary Bow R M.D on 01/04/2017 at 10:54 AM  Between 7am to 6pm - Pager - [phone]  After 6pm go to [email or web address] - password [email or web address] [facility]  Office  [phone]  CC: Primary care physician; Ezequiel Kayser, MD

## 2017-01-04 NOTE — Care Management Important Message (Signed)
Important Message  Patient Details  Name: Hurchel Walkes MRN: [id number] Date of Birth: [birth year]   Medicare Important Message Given:  Yes    Beverly Sessions, RN 01/04/2017, 12:56 PM

## 2017-01-04 NOTE — Progress Notes (Signed)
Physical Therapy Treatment Patient Details Name: Evan [name] MRN: [id number] DOB: 1945-04-16 Today's Date: 01/04/2017    History of Present Illness 72 yo male with onset of GI bleed and due to recurrence had sigmoid colectomy 01/02/16, laparascopy, anastomosis.  Home with family previously.  PMHx:  a-fib, CHF, DM, HLD, HTN, sleep apnea, atrial ablation    PT Comments    Pt progressing well towards goals but continues to present with minor deficits in strength, gait, transfers, and activity tolerance.   Pt was SBA with sup to sit with use of bed rails and education provided for log roll technique for decreased abdominal strain.  Pt was SBA with transfers with verbal cues for proper sequencing.  Pt's SpO2 94% on room air with nsg giving OK for amb without supplemental O2.  Pt ambulated 1 x 15', 1 x 30' tolerating very well without SOB or any adverse symptoms with SpO2 in low 90s.  Pt then ambulated 2 x 100' with rest break between ambulation with SpO2 93% after first 100' walk then 89% after second 100' walk with minimal SOB and no adverse symptoms.  Nsg notified of drop in SpO2 to 89% and pt returned to 1.5LO2/min.  Pt will benefit from PT services to address above deficits for decreased caregiver assistance upon discharge.    Follow Up Recommendations  Home health PT     Equipment Recommendations       Recommendations for Other Services       Precautions / Restrictions Precautions Precautions: Fall Restrictions Weight Bearing Restrictions: No    Mobility  Bed Mobility Overal bed mobility: Modified Independent Bed Mobility: Supine to Sit     Supine to sit: Supervision     General bed mobility comments: Log roll technique training provided for sup to/from sit  Transfers Overall transfer level: Needs assistance Equipment used: Rolling walker (2 wheeled) Transfers: Sit to/from Stand Sit to Stand: Supervision Stand pivot transfers: Supervision       General transfer  comment: cued for proper hand placement  Ambulation/Gait Ambulation/Gait assistance: Supervision Ambulation Distance (Feet): 100 Feet Assistive device: Rolling walker (2 wheeled) Gait Pattern/deviations: WFL(Within Functional Limits);Step-through pattern   Gait velocity interpretation: Below normal speed for age/gender     Stairs            Wheelchair Mobility    Modified Rankin (Stroke Patients Only)       Balance Overall balance assessment: Needs assistance   Sitting balance-Leahy Scale: Normal     Standing balance support: Bilateral upper extremity supported Standing balance-Leahy Scale: Good                      Cognition Arousal/Alertness: Awake/alert Behavior During Therapy: WFL for tasks assessed/performed Overall Cognitive Status: Within Functional Limits for tasks assessed                      Exercises      General Comments        Pertinent Vitals/Pain Pain Assessment: 0-10 Pain Score: 2  Pain Location: incision site Pain Descriptors / Indicators: Sore Pain Intervention(s): Monitored during session    Home Living                      Prior Function            PT Goals (current goals can now be found in the care plan section) Progress towards PT goals: Progressing toward goals  Frequency    Min 2X/week      PT Plan Current plan remains appropriate    Co-evaluation             End of Session Equipment Utilized During Treatment: Gait belt Activity Tolerance: Patient tolerated treatment well Patient left:  (Staff entered room to take pt for imaging at end of session)     Time: [id number] PT Time Calculation (min) (ACUTE ONLY): 23 min  Charges:  $Gait Training: 8-22 mins $Therapeutic Activity: 8-22 mins                    G Codes:      DRoyetta Asal PT, DPT 01/04/17, 2:26 PM

## 2017-01-04 NOTE — Care Management (Signed)
Patient admitted with GI bleed, lives at home with spouse.  Has walker and cane in the home.  Prior to admission was independent.  PCP Theis.  PT has recommended home health PT.  RNCM will arrange prior to discharge.  RNCM following for discharge planning needs.

## 2017-01-05 LAB — BASIC METABOLIC PANEL
Anion gap: 5 (ref 5–15)
BUN: 7 mg/dL (ref 6–20)
CO2: 28 mmol/L (ref 22–32)
CREATININE: 0.55 mg/dL — AB (ref 0.61–1.24)
Calcium: 8 mg/dL — ABNORMAL LOW (ref 8.9–10.3)
Chloride: 105 mmol/L (ref 101–111)
Glucose, Bld: 124 mg/dL — ABNORMAL HIGH (ref 65–99)
Potassium: 3.2 mmol/L — ABNORMAL LOW (ref 3.5–5.1)
SODIUM: 138 mmol/L (ref 135–145)

## 2017-01-05 LAB — CBC
HCT: 21.3 % — ABNORMAL LOW (ref 40.0–52.0)
Hemoglobin: 7 g/dL — ABNORMAL LOW (ref 13.0–18.0)
MCH: 28.3 pg (ref 26.0–34.0)
MCHC: 32.8 g/dL (ref 32.0–36.0)
MCV: 86.4 fL (ref 80.0–100.0)
PLATELETS: 219 10*3/uL (ref 150–440)
RBC: 2.46 MIL/uL — ABNORMAL LOW (ref 4.40–5.90)
RDW: 15.9 % — ABNORMAL HIGH (ref 11.5–14.5)
WBC: 12.1 10*3/uL — ABNORMAL HIGH (ref 3.8–10.6)

## 2017-01-05 LAB — GLUCOSE, CAPILLARY
GLUCOSE-CAPILLARY: 132 mg/dL — AB (ref 65–99)
Glucose-Capillary: 126 mg/dL — ABNORMAL HIGH (ref 65–99)
Glucose-Capillary: 112 mg/dL — ABNORMAL HIGH (ref 65–99)
Glucose-Capillary: 227 mg/dL — ABNORMAL HIGH (ref 65–99)
Glucose-Capillary: 151 mg/dL — ABNORMAL HIGH (ref 65–99)
Glucose-Capillary: 160 mg/dL — ABNORMAL HIGH (ref 65–99)

## 2017-01-05 MED ORDER — POTASSIUM CHLORIDE CRYS ER 20 MEQ PO TBCR
40.0000 meq | EXTENDED_RELEASE_TABLET | Freq: Once | ORAL | Status: AC
  Administered 2017-01-05: 40 meq via ORAL
  Filled 2017-01-05: qty 2

## 2017-01-05 NOTE — Progress Notes (Signed)
01/05/2017  Subjective: Patient is 4 Days Post-Op status post exploratory laparotomy with sigmoidectomy for diverticular GI bleed. No acute events overnight. Patient denies any significant amount of blood from below with only some dark stool. Continues passing flatus. Pain is well-controlled and reports some increased soreness when he coughs.  Vital signs: Temp:  [97.9 F (36.6 C)-99.4 F (37.4 C)] 98.5 F (36.9 C) (01/06 0756) Pulse Rate:  [89-114] 89 (01/06 0756) Resp:  [18-20] 20 (01/06 0756) BP: (105-127)/(50-62) 127/50 (01/06 0756) SpO2:  [91 %-97 %] 92 % (01/06 0756) Weight:  [138.7 kg (305 lb 12.8 oz)] 138.7 kg (305 lb 12.8 oz) (01/06 0500)   Intake/Output: 01/05 0701 - 01/06 0700 In: 1140 [P.O.:1140] Out: 550 [Urine:550] Last BM Date: 01/04/17  Physical Exam: Constitutional: No acute distress Abdomen:  Soft, nondistended, obese, appropriately tender to palpation. Incision is clean dry and intact with staples. Dressing has been removed.  Labs:   Recent Labs  01/04/17 0429 01/05/17 0527  WBC 15.8* 12.1*  HGB 7.8* 7.0*  HCT 23.1* 21.3*  PLT 192 219    Recent Labs  01/04/17 0429 01/05/17 0527  NA 139 138  K 3.8 3.2*  CL 108 105  CO2 27 28  GLUCOSE 105* 124*  BUN 8 7  CREATININE 0.65 0.55*  CALCIUM 7.8* 8.0*   No results for input(s): LABPROT, INR in the last 72 hours.  Imaging: Dg Chest 2 View  Result Date: 01/04/2017 CLINICAL DATA:  Shortness of breath, rapid heart rate EXAM: CHEST  2 VIEW COMPARISON:  None. FINDINGS: The heart size and mediastinal contours are within normal limits. Both lungs are clear. The visualized skeletal structures are unremarkable. IMPRESSION: No active cardiopulmonary disease. Electronically Signed   By: Kathreen Devoid   On: 01/04/2017 12:00    Assessment/Plan: 72 year old male status post exploratory laparotomy and sigmoidectomy for lower GI bleed.  -Continue soft diet as tolerated. Patient has been reassured that currently  given that he had a bowel prep before surgery were not expecting him to have significant bowel movements and the fact that he is passing gas from below is very reassuring. -The patient's white blood cell count continues to improve. Likely this was reactive from significant stress from his GI bleed. His hemoglobin and hematocrit relatively stable and the patient has not had any significant episodes of GI bleed anymore. -From a surgical standpoint would be very reasonable for discharge to home tomorrow 1/7.   Melvyn Neth, [place]

## 2017-01-05 NOTE — Progress Notes (Signed)
[facility] [place] NAME: Evan [name]    MR#:  [id number]  DATE OF BIRTH:  12-18-1945  SUBJECTIVE:  CHIEF COMPLAINT:   Chief Complaint  Patient presents with  . GI Bleeding   Admitted for GI bleed. Had micro-embolization with GI bleed stopping for a day and then recurrent bleed. Transfused blood , Numerous times during his stay in the hospital. Had sigmoid colectomy 01/02/2016  No abd pain. No more active bleeding, hemoglobin level is stable, now on soft diet per surgery.    REVIEW OF SYSTEMS:  Review of Systems  Constitutional: Positive for malaise/fatigue. Negative for chills and fever.  HENT: Negative for congestion, hearing loss, nosebleeds and tinnitus.   Eyes: Negative for blurred vision and double vision.  Respiratory: Negative for cough, shortness of breath and wheezing.   Cardiovascular: Negative for chest pain, palpitations and leg swelling.  Gastrointestinal: Positive for blood in stool. Negative for abdominal pain, constipation, diarrhea, nausea and vomiting.  Genitourinary: Negative for dysuria and urgency.  Musculoskeletal: Negative for myalgias.  Neurological: Positive for dizziness. Negative for speech change, focal weakness, seizures and headaches.  Psychiatric/Behavioral: Negative for depression.    DRUG ALLERGIES:   Allergies  Allergen Reactions  . Azithromycin Rash  . Penicillins Rash    Has patient had a PCN reaction causing immediate rash, facial/tongue/throat swelling, SOB or lightheadedness with hypotension: Yes Has patient had a PCN reaction causing severe rash involving mucus membranes or skin necrosis: Yes Has patient had a PCN reaction that required hospitalization No Has patient had a PCN reaction occurring within the last 10 years: No If all of the above answers are "NO", then may proceed with Cephalosporin use.   [name] Lisinopril Cough  . Losartan Cough  . Metoprolol Other (See Comments)   Bradycardia.    VITALS:  Blood pressure (!) 117/57, pulse 97, temperature 97.9 F (36.6 C), temperature source Oral, resp. rate 20, height 6\' 3"  (1.905 m), weight (!) 138.7 kg (305 lb 12.8 oz), SpO2 99 %.  PHYSICAL EXAMINATION:  Physical Exam  GENERAL:  72 y.o.-year-old patient lying in the bed with no acute distress. Obese male. EYES: Pupils equal, round, reactive to light and accommodation. No scleral icterus. Extraocular muscles intact.  HEENT: Head atraumatic, normocephalic. Oropharynx and nasopharynx clear.  NECK:  Supple, no jugular venous distention. No thyroid enlargement, no tenderness.  LUNGS: Normal work of breathing. Bibasilar crackles. CARDIOVASCULAR: S1, S2 normal. No murmurs, rubs, or gallops.  ABDOMEN: sugical dressing EXTREMITIES: Nocyanosis, or clubbing. 1+ edema of all extremities noted NEUROLOGIC: Cranial nerves II through XII are intact. Muscle strength 5/5 in all extremities. Sensation intact. Gait not checked.  PSYCHIATRIC: The patient is alert and oriented x 3.  SKIN: No obvious rash, lesion, or ulcer.    LABORATORY PANEL:   CBC  Recent Labs Lab 01/05/17 0527  WBC 12.1*  HGB 7.0*  HCT 21.3*  PLT 219   ------------------------------------------------------------------------------------------------------------------  Chemistries   Recent Labs Lab 01/04/17 0429 01/05/17 0527  NA 139 138  K 3.8 3.2*  CL 108 105  CO2 27 28  GLUCOSE 105* 124*  BUN 8 7  CREATININE 0.65 0.55*  CALCIUM 7.8* 8.0*  MG 1.8  --    ------------------------------------------------------------------------------------------------------------------  Cardiac Enzymes No results for input(s): TROPONINI in the last 168 hours. ------------------------------------------------------------------------------------------------------------------  RADIOLOGY:  Dg Chest 2 View  Result Date: 01/04/2017 CLINICAL DATA:  Shortness of breath, rapid heart rate EXAM: CHEST  2 VIEW  COMPARISON:  None. FINDINGS: The heart size and mediastinal contours are within normal limits. Both lungs are clear. The visualized skeletal structures are unremarkable. IMPRESSION: No active cardiopulmonary disease. Electronically Signed   By: Kathreen Devoid   On: 01/04/2017 12:00    EKG:   Orders placed or performed during the hospital encounter of 12/27/16  . ED EKG  . ED EKG  . EKG 12-Lead  . EKG 12-Lead    ASSESSMENT AND PLAN:   Evan [name] a 72 y.o.malewith a known history of Diverticular bleed needing vascular embolization 2013, upper GI bleed due to NSAID's in 2015 presents to the emergency room with bright red blood per rectum.   * Hypoxia Patient likely had mild pulmonary edema due to multiple blood transfusions or atelectasis. Chest x-ray was unremarkable. Oral Lasix was given with some diuresis and improvement of hypoxia, now O2 sats are 92-99% on room air.  * Blurry vision Likely due to mild hypoxia. Corrected on its own.  * Bright red blood per rectum - diverticular bleed. Received total of 9 units prbc S/p sigmoid colectomy 01/02/2016 , Now on soft diet today, possibly discharge home tomorrow, per surgery No further blood noticed.   * Leukocytosis-could be reactive. No fevers. Improving - Stopped meropenem  * DVT prophylaxis-Ted's and SCDs. No anticoagulation due to GI bleed  *Hypokalemia, supplement orally, recheck in the morning  *Acute posthemorrhagic anemia, transfuse patient if needed, patient was ambulated with stable heart rate and blood pressure, no active bleeding All the records are reviewed and case discussed with Care Management/Social Workerr. Management plans discussed with the patient, family and they are in agreement.  CODE STATUS: Full code  TOTAL TIME SPENT IN TAKING CARE OF THIS PATIENT: 30 minutes.   POSSIBLE D/C IN 2-3 DAYS, DEPENDING ON CLINICAL CONDITION.  Theodoro Grist M.D on 01/05/2017 at 3:22 PM  Between 7am to 6pm - Pager -  [phone]  After 6pm go to [email or web address] - password [email or web address] [facility]  Office  [phone]  CC: Primary care physician; Ezequiel Kayser, MD

## 2017-01-06 DIAGNOSIS — D72829 Elevated white blood cell count, unspecified: Secondary | ICD-10-CM

## 2017-01-06 DIAGNOSIS — E876 Hypokalemia: Secondary | ICD-10-CM

## 2017-01-06 DIAGNOSIS — D62 Acute posthemorrhagic anemia: Secondary | ICD-10-CM

## 2017-01-06 DIAGNOSIS — Z5189 Encounter for other specified aftercare: Secondary | ICD-10-CM

## 2017-01-06 LAB — GLUCOSE, CAPILLARY
GLUCOSE-CAPILLARY: 215 mg/dL — AB (ref 65–99)
Glucose-Capillary: 124 mg/dL — ABNORMAL HIGH (ref 65–99)
Glucose-Capillary: 125 mg/dL — ABNORMAL HIGH (ref 65–99)
Glucose-Capillary: 192 mg/dL — ABNORMAL HIGH (ref 65–99)

## 2017-01-06 LAB — CBC
HEMATOCRIT: 21.6 % — AB (ref 40.0–52.0)
Hemoglobin: 7.2 g/dL — ABNORMAL LOW (ref 13.0–18.0)
MCH: 28.6 pg (ref 26.0–34.0)
MCHC: 33.2 g/dL (ref 32.0–36.0)
MCV: 86.1 fL (ref 80.0–100.0)
PLATELETS: 261 10*3/uL (ref 150–440)
RBC: 2.5 MIL/uL — ABNORMAL LOW (ref 4.40–5.90)
RDW: 16.3 % — AB (ref 11.5–14.5)
WBC: 9.8 10*3/uL (ref 3.8–10.6)

## 2017-01-06 MED ORDER — BOOST / RESOURCE BREEZE PO LIQD: 1.0000 | Freq: Three times a day (TID) | ORAL | 5 refills | Status: DC

## 2017-01-06 MED ORDER — FERROUS GLUCONATE 324 (38 FE) MG PO TABS: 324.0000 mg | ORAL_TABLET | Freq: Two times a day (BID) | ORAL | 5 refills | Status: DC

## 2017-01-06 MED ORDER — FERROUS GLUCONATE 324 (38 FE) MG PO TABS
324.0000 mg | ORAL_TABLET | Freq: Two times a day (BID) | ORAL | Status: DC
  Filled 2017-01-06 (×3): qty 1

## 2017-01-06 NOTE — Care Management Note (Signed)
Case Management Note  Patient Details  Name: Mishawn Bartkiewicz MRN: [id number] Date of Birth: 12-Jun-1945  Subjective/Objective:       Referral for HH-PT called to Jana Half at [facility] per they are in network with [facility] insurance.  Mr Reedus already has a RW at home.             Action/Plan:   Expected Discharge Date:                  Expected Discharge Plan:     In-House Referral:     Discharge planning Services     Post Acute Care Choice:    Choice offered to:     DME Arranged:    DME Agency:     HH Arranged:    HH Agency:     Status of Service:     If discussed at [employer] of Stay Meetings, dates discussed:    Additional Comments:  Zi Sek A, RN 01/06/2017, 1:24 PM

## 2017-01-06 NOTE — Discharge Summary (Signed)
[facility] [place] NAME: Evan [name]    MR#:  [id number]  DATE OF BIRTH:  1945-10-21  DATE OF ADMISSION:  12/27/2016 ADMITTING PHYSICIAN: Hillary Bow, MD  DATE OF DISCHARGE: No discharge date for patient encounter.  PRIMARY CARE PHYSICIAN: THIES, DAVID, MD     ADMISSION DIAGNOSIS:  BRBPR (bright red blood per rectum) [K62.5] Gastrointestinal hemorrhage, unspecified gastrointestinal hemorrhage type [K92.2]  DISCHARGE DIAGNOSIS:  Active Problems:   BRBPR (bright red blood per rectum)   History of GI diverticular bleed   Acute lower GI bleeding   Acute posthemorrhagic anemia   Encounter for blood transfusion   S/P laparoscopic-assisted sigmoidectomy   Leukocytosis   Hypokalemia   S/P exploratory laparotomy   SECONDARY DIAGNOSIS:   Past Medical History:  Diagnosis Date  . Atrial fibrillation ([place])   . BPH (benign prostatic hyperplasia)   . CHF (congestive heart failure) ([place])   . Diabetes mellitus without complication ([place])   . Diverticulosis   . Hyperlipidemia   . Hypertension   . Sleep apnea     .pro HOSPITAL COURSE:   Patient is a 72 year old African-American male with past medical history significant for history of diabetes mellitus, hypertension, congestive heart failure, diverticulosis, hyperlipidemia, who presents to the hospital with complaints of gastrointestinal bleed. Nuclear medicine test  revealed that he had bleeding and sigmoid colon, he received numerous blood transfusions for ongoing bleed. He was seen by vascular surgery and taken for angiogram with embolization and sigmoid colon more proximal branch of IMA. Despite procedure patient had recurrent bleeding. At that point he was seen by surgery and recommended exploratory laparotomy and resection of portion of the colon. Patient underwent this procedure and postoperatively did well with no recurrent bleeding. He was initiated on diet and was felt to  be stable to be discharged home.  . Discussion by problem: #1. Lower gastrointestinal bleed from the sigmoid colon, status post multiple blood transfusions, vascular procedure, and finally expiratory laparotomy with sigmoidectomy. Postoperatively, patient did well, his diet is advanced and no more bleeding was noted #2. Acute posthemorrhagic anemia, status post transfusions, patient is to continue iron supplementation and follow up with his primary care physician to recheck his CBC as outpatient #3. Leukocytosis, resolved, likely stress/bleeding related #4. Hypokalemia, supplemented orally, intravenously, follow-up as outpatient  STABLE  CONSULTS OBTAINED:  Treatment Team:  Katha Cabal, MD Hubbard Robinson, MD Gladstone Lighter, MD Olean Ree, MD  DRUG ALLERGIES:   Allergies  Allergen Reactions  . Azithromycin Rash  . Penicillins Rash    Has patient had a PCN reaction causing immediate rash, facial/tongue/throat swelling, SOB or lightheadedness with hypotension: Yes Has patient had a PCN reaction causing severe rash involving mucus membranes or skin necrosis: Yes Has patient had a PCN reaction that required hospitalization No Has patient had a PCN reaction occurring within the last 10 years: No If all of the above answers are "NO", then may proceed with Cephalosporin use.   [name] Lisinopril Cough  . Losartan Cough  . Metoprolol Other (See Comments)    Bradycardia.    DISCHARGE MEDICATIONS:   Current Discharge Medication List    START taking these medications   Details  feeding supplement (BOOST / RESOURCE BREEZE) LIQD Take 1 Container by mouth 3 (three) times daily between meals. Qty: 90 Container, Refills: 5    ferrous gluconate (FERGON) 324 MG tablet Take 1 tablet (324 mg total) by mouth 2 (two) times daily  with a meal. Qty: 60 tablet, Refills: 5      CONTINUE these medications which have NOT CHANGED   Details  amLODipine (NORVASC) 5 MG tablet Take 5 mg by  mouth daily.    Cholecalciferol (VITAMIN D3) 2000 units capsule Take 2,000 Units by mouth daily.    docusate calcium (SURFAK) 240 MG capsule Take 1 capsule by mouth daily.    dorzolamide-timolol (COSOPT) 22.3-6.8 MG/ML ophthalmic solution Apply 1 drop to eye 2 (two) times daily.    latanoprost (XALATAN) 0.005 % ophthalmic solution Apply 1 drop to eye at bedtime.    metFORMIN (GLUCOPHAGE) 1000 MG tablet Take 1,000 mg by mouth daily with breakfast.     Multiple Vitamins-Minerals (MULTIVITAMIN ADULT PO) Take 1 tablet by mouth daily.    Omega-3 Fatty Acids (FISH OIL) 1200 MG CAPS Take 2 capsules by mouth daily.    pravastatin (PRAVACHOL) 40 MG tablet Take 40 mg by mouth daily.    acetaminophen (TYLENOL) 500 MG tablet Take 1,000 mg by mouth every 6 (six) hours as needed.      STOP taking these medications     aspirin 81 MG tablet          DISCHARGE INSTRUCTIONS:    The patient is to follow-up with primary care physician and surgery as outpatient   If you experience worsening of your admission symptoms, develop shortness of breath, life threatening emergency, suicidal or homicidal thoughts you must seek medical attention immediately by calling 911 or calling your MD immediately  if symptoms less severe.  You Must read complete instructions/literature along with all the possible adverse reactions/side effects for all the Medicines you take and that have been prescribed to you. Take any new Medicines after you have completely understood and accept all the possible adverse reactions/side effects.   Please note  You were cared for by a hospitalist during your hospital stay. If you have any questions about your discharge medications or the care you received while you were in the hospital after you are discharged, you can call the unit and asked to speak with the hospitalist on call if the hospitalist that took care of you is not available. Once you are discharged, your primary care  physician will handle any further medical issues. Please note that NO REFILLS for any discharge medications will be authorized once you are discharged, as it is imperative that you return to your primary care physician (or establish a relationship with a primary care physician if you do not have one) for your aftercare needs so that they can reassess your need for medications and monitor your lab values.    Today   CHIEF COMPLAINT:   Chief Complaint  Patient presents with  . GI Bleeding    HISTORY OF PRESENT ILLNESS:  Clara Vieyra  is a 72 y.o. male with a known history of diabetes mellitus, hypertension, congestive heart failure, diverticulosis, hyperlipidemia, who presents to the hospital with complaints of gastrointestinal bleed. Nuclear medicine test  revealed that he had bleeding and sigmoid colon, he received numerous blood transfusions for ongoing bleed. He was seen by vascular surgery and taken for angiogram with embolization and sigmoid colon more proximal branch of IMA. Despite procedure patient had recurrent bleeding. At that point he was seen by surgery and recommended exploratory laparotomy and resection of portion of the colon. Patient underwent this procedure and postoperatively did well with no recurrent bleeding. He was initiated on diet and was felt to be stable to be discharged  home.  . Discussion by problem: #1. Lower gastrointestinal bleed from the sigmoid colon, status post multiple blood transfusions, vascular procedure, and finally expiratory laparotomy with sigmoidectomy. Postoperatively, patient did well, his diet is advanced and no more bleeding was noted #2. Acute posthemorrhagic anemia, status post transfusions, patient is to continue iron supplementation and follow up with his primary care physician to recheck his CBC as outpatient #3. Leukocytosis, resolved, likely stress/bleeding related #4. Hypokalemia, supplemented orally, intravenously, follow-up as  outpatient    VITAL SIGNS:  Blood pressure 128/70, pulse 77, temperature 98 F (36.7 C), temperature source Oral, resp. rate 18, height 6\' 3"  (1.905 m), weight (!) 138.6 kg (305 lb 8 oz), SpO2 94 %.  I/O:   Intake/Output Summary (Last 24 hours) at 01/06/17 1336 Last data filed at 01/06/17 1007  Gross per 24 hour  Intake             1200 ml  Output             1725 ml  Net             -525 ml    PHYSICAL EXAMINATION:  GENERAL:  72 y.o.-year-old patient lying in the bed with no acute distress.  EYES: Pupils equal, round, reactive to light and accommodation. No scleral icterus. Extraocular muscles intact.  HEENT: Head atraumatic, normocephalic. Oropharynx and nasopharynx clear.  NECK:  Supple, no jugular venous distention. No thyroid enlargement, no tenderness.  LUNGS: Normal breath sounds bilaterally, no wheezing, rales,rhonchi or crepitation. No use of accessory muscles of respiration.  CARDIOVASCULAR: S1, S2 normal. No murmurs, rubs, or gallops.  ABDOMEN: Soft, non-tender, non-distended. Bowel sounds present. No organomegaly or mass.  EXTREMITIES: No pedal edema, cyanosis, or clubbing.  NEUROLOGIC: Cranial nerves II through XII are intact. Muscle strength 5/5 in all extremities. Sensation intact. Gait not checked.  PSYCHIATRIC: The patient is alert and oriented x 3.  SKIN: No obvious rash, lesion, or ulcer.   DATA REVIEW:   CBC  Recent Labs Lab 01/06/17 0606  WBC 9.8  HGB 7.2*  HCT 21.6*  PLT 261    Chemistries   Recent Labs Lab 01/04/17 0429 01/05/17 0527  NA 139 138  K 3.8 3.2*  CL 108 105  CO2 27 28  GLUCOSE 105* 124*  BUN 8 7  CREATININE 0.65 0.55*  CALCIUM 7.8* 8.0*  MG 1.8  --     Cardiac Enzymes No results for input(s): TROPONINI in the last 168 hours.  Microbiology Results  Results for orders placed or performed during the hospital encounter of 12/27/16  MRSA PCR Screening     Status: None   Collection Time: 12/27/16  9:15 PM  Result Value  Ref Range Status   MRSA by PCR NEGATIVE NEGATIVE Final    Comment:        The GeneXpert MRSA Assay (FDA approved for NASAL specimens only), is one component of a comprehensive MRSA colonization surveillance program. It is not intended to diagnose MRSA infection nor to guide or monitor treatment for MRSA infections.   CULTURE, BLOOD (ROUTINE X 2) w Reflex to ID Panel     Status: None   Collection Time: 12/29/16 10:25 AM  Result Value Ref Range Status   Specimen Description BLOOD LEFT HAND  Final   Special Requests BOTTLES DRAWN AEROBIC AND ANAEROBIC ANA9ML AER 8ML  Final   Culture NO GROWTH 5 DAYS  Final   Report Status 01/03/2017 FINAL  Final  CULTURE, BLOOD (ROUTINE X 2)  w Reflex to ID Panel     Status: None   Collection Time: 12/29/16 10:46 AM  Result Value Ref Range Status   Specimen Description BLOOD RIGHT HAND  Final   Special Requests   Final    BOTTLES DRAWN AEROBIC AND ANAEROBIC  AER 5 ML ANA 4 ML   Culture NO GROWTH 5 DAYS  Final   Report Status 01/03/2017 FINAL  Final  MRSA PCR Screening     Status: None   Collection Time: 12/31/16  4:16 PM  Result Value Ref Range Status   MRSA by PCR NEGATIVE NEGATIVE Final    Comment:        The GeneXpert MRSA Assay (FDA approved for NASAL specimens only), is one component of a comprehensive MRSA colonization surveillance program. It is not intended to diagnose MRSA infection nor to guide or monitor treatment for MRSA infections.     RADIOLOGY:  No results found.  EKG:   Orders placed or performed during the hospital encounter of 12/27/16  . ED EKG  . ED EKG  . EKG 12-Lead  . EKG 12-Lead      Management plans discussed with the patient, family and they are in agreement.  CODE STATUS:     Code Status Orders        Start     Ordered   12/27/16 1155  Full code  Continuous     12/27/16 1154    Code Status History    Date Active Date Inactive Code Status Order ID Comments User Context   This patient has  a current code status but no historical code status.    Advance Directive Documentation   [place] Most Recent Value  Type of Advance Directive  Living will  Pre-existing out of facility DNR order (yellow form or pink MOST form)  No data  "MOST" Form in Place?  No data      TOTAL TIME TAKING CARE OF THIS PATIENT40  minutes.    Theodoro Grist M.D on 01/06/2017 at 1:36 PM  Between 7am to 6pm - Pager - [phone]  After 6pm go to [email or web address] - password [email or web address] [facility]  Office  [phone]  CC: Primary care physician; Ezequiel Kayser, MD

## 2017-01-06 NOTE — Progress Notes (Signed)
01/06/2017  Subjective: Patient is 5 Days Post-Op s/p exploratory laparotomy with sigmoidectomy.  No acute events.  No further bleeding.  Tolerating diet, passing flatus, ambulating.  Vital signs: Temp:  [97.9 F (36.6 C)-98.3 F (36.8 C)] 98 F (36.7 C) (01/07 0803) Pulse Rate:  [77-102] 77 (01/07 0803) Resp:  [16-20] 18 (01/07 0803) BP: (117-148)/(54-73) 128/70 (01/07 0803) SpO2:  [92 %-99 %] 94 % (01/07 0941) Weight:  [138.6 kg (305 lb 8 oz)] 138.6 kg (305 lb 8 oz) (01/07 0455)   Intake/Output: 01/06 0701 - 01/07 0700 In: 960 [P.O.:960] Out: 1875 [Urine:1875] Last BM Date: 01/06/17  Physical Exam: Constitutional: No acute distress. Abdomen:  Soft, nondistended, nontender.  Incision clean, intact with staples.  Mild serosang drainage from lower portion of wound between staples.  Labs:   Recent Labs  01/05/17 0527 01/06/17 0606  WBC 12.1* 9.8  HGB 7.0* 7.2*  HCT 21.3* 21.6*  PLT 219 261    Recent Labs  01/04/17 0429 01/05/17 0527  NA 139 138  K 3.8 3.2*  CL 108 105  CO2 27 28  GLUCOSE 105* 124*  BUN 8 7  CREATININE 0.65 0.55*  CALCIUM 7.8* 8.0*   No results for input(s): LABPROT, INR in the last 72 hours.  Imaging: No results found.  Assessment/Plan: 72 yo male s/p ex-lap and sigmoidectomy  --From surgical standpoint, can be discharged today. --Will need dry gauze/ABD dressing over his wound for the drainage.   --Follow up with surgery in a week for staple removal.   Melvyn Neth, [place]

## 2017-01-06 NOTE — Plan of Care (Signed)
Problem: Bowel/Gastric: Goal: Will show no signs and symptoms of gastrointestinal bleeding Outcome: Progressing Hgb is holding steady at 7.0

## 2017-01-07 ENCOUNTER — Inpatient Hospital Stay
Admission: EM | Admit: 2017-01-07 | Discharge: 2017-01-15 | DRG: 907 | Disposition: A | Discharge status: Home Health | Payer: Medicare Other | Attending: Surgery | Admitting: Surgery

## 2017-01-07 DIAGNOSIS — R059 Cough, unspecified: Secondary | ICD-10-CM

## 2017-01-07 DIAGNOSIS — H401190 Primary open-angle glaucoma, unspecified eye, stage unspecified: Secondary | ICD-10-CM | POA: Diagnosis present

## 2017-01-07 DIAGNOSIS — E1136 Type 2 diabetes mellitus with diabetic cataract: Secondary | ICD-10-CM | POA: Diagnosis present

## 2017-01-07 DIAGNOSIS — Z87891 Personal history of nicotine dependence: Secondary | ICD-10-CM

## 2017-01-07 DIAGNOSIS — N401 Enlarged prostate with lower urinary tract symptoms: Secondary | ICD-10-CM | POA: Diagnosis present

## 2017-01-07 DIAGNOSIS — Z96651 Presence of right artificial knee joint: Secondary | ICD-10-CM | POA: Diagnosis present

## 2017-01-07 DIAGNOSIS — R05 Cough: Secondary | ICD-10-CM

## 2017-01-07 DIAGNOSIS — Z833 Family history of diabetes mellitus: Secondary | ICD-10-CM

## 2017-01-07 DIAGNOSIS — K9189 Other postprocedural complications and disorders of digestive system: Secondary | ICD-10-CM

## 2017-01-07 DIAGNOSIS — R062 Wheezing: Secondary | ICD-10-CM

## 2017-01-07 DIAGNOSIS — D62 Acute posthemorrhagic anemia: Secondary | ICD-10-CM | POA: Diagnosis present

## 2017-01-07 DIAGNOSIS — I82412 Acute embolism and thrombosis of left femoral vein: Secondary | ICD-10-CM | POA: Diagnosis present

## 2017-01-07 DIAGNOSIS — I82403 Acute embolism and thrombosis of unspecified deep veins of lower extremity, bilateral: Secondary | ICD-10-CM | POA: Diagnosis present

## 2017-01-07 DIAGNOSIS — T8131XA Disruption of external operation (surgical) wound, not elsewhere classified, initial encounter: Principal | ICD-10-CM | POA: Diagnosis present

## 2017-01-07 DIAGNOSIS — E669 Obesity, unspecified: Secondary | ICD-10-CM | POA: Diagnosis present

## 2017-01-07 DIAGNOSIS — I5032 Chronic diastolic (congestive) heart failure: Secondary | ICD-10-CM | POA: Diagnosis present

## 2017-01-07 DIAGNOSIS — Z6837 Body mass index (BMI) 37.0-37.9, adult: Secondary | ICD-10-CM

## 2017-01-07 DIAGNOSIS — I4891 Unspecified atrial fibrillation: Secondary | ICD-10-CM | POA: Diagnosis present

## 2017-01-07 DIAGNOSIS — H538 Other visual disturbances: Secondary | ICD-10-CM | POA: Diagnosis present

## 2017-01-07 DIAGNOSIS — R262 Difficulty in walking, not elsewhere classified: Secondary | ICD-10-CM

## 2017-01-07 DIAGNOSIS — Z881 Allergy status to other antibiotic agents status: Secondary | ICD-10-CM

## 2017-01-07 DIAGNOSIS — R350 Frequency of micturition: Secondary | ICD-10-CM | POA: Diagnosis present

## 2017-01-07 DIAGNOSIS — M7989 Other specified soft tissue disorders: Secondary | ICD-10-CM

## 2017-01-07 DIAGNOSIS — Z7984 Long term (current) use of oral hypoglycemic drugs: Secondary | ICD-10-CM

## 2017-01-07 DIAGNOSIS — E785 Hyperlipidemia, unspecified: Secondary | ICD-10-CM | POA: Diagnosis present

## 2017-01-07 DIAGNOSIS — J9601 Acute respiratory failure with hypoxia: Secondary | ICD-10-CM | POA: Diagnosis not present

## 2017-01-07 DIAGNOSIS — D72829 Elevated white blood cell count, unspecified: Secondary | ICD-10-CM | POA: Diagnosis present

## 2017-01-07 DIAGNOSIS — Y92019 Unspecified place in single-family (private) house as the place of occurrence of the external cause: Secondary | ICD-10-CM

## 2017-01-07 DIAGNOSIS — E1139 Type 2 diabetes mellitus with other diabetic ophthalmic complication: Secondary | ICD-10-CM | POA: Diagnosis present

## 2017-01-07 DIAGNOSIS — Z79899 Other long term (current) drug therapy: Secondary | ICD-10-CM

## 2017-01-07 DIAGNOSIS — IMO0002 Reserved for concepts with insufficient information to code with codable children: Secondary | ICD-10-CM | POA: Diagnosis present

## 2017-01-07 DIAGNOSIS — G4730 Sleep apnea, unspecified: Secondary | ICD-10-CM | POA: Diagnosis present

## 2017-01-07 DIAGNOSIS — E11319 Type 2 diabetes mellitus with unspecified diabetic retinopathy without macular edema: Secondary | ICD-10-CM | POA: Diagnosis present

## 2017-01-07 DIAGNOSIS — I110 Hypertensive heart disease with heart failure: Secondary | ICD-10-CM | POA: Diagnosis present

## 2017-01-07 DIAGNOSIS — Z880 Allergy status to penicillin: Secondary | ICD-10-CM

## 2017-01-07 DIAGNOSIS — T8130XA Disruption of wound, unspecified, initial encounter: Secondary | ICD-10-CM | POA: Diagnosis not present

## 2017-01-07 DIAGNOSIS — Z9049 Acquired absence of other specified parts of digestive tract: Secondary | ICD-10-CM

## 2017-01-07 DIAGNOSIS — H47012 Ischemic optic neuropathy, left eye: Secondary | ICD-10-CM | POA: Diagnosis present

## 2017-01-07 DIAGNOSIS — Z888 Allergy status to other drugs, medicaments and biological substances status: Secondary | ICD-10-CM

## 2017-01-07 DIAGNOSIS — E876 Hypokalemia: Secondary | ICD-10-CM | POA: Diagnosis present

## 2017-01-07 NOTE — ED Triage Notes (Signed)
Pt had surgery due to diverticulitis on Tuesday and today he noted "tissue" coming through some of the staples with a couple staples missing. Pt denies any injury or pain. Also states has had increased urination since yest, did have a catheter while in the hospital.

## 2017-01-08 ENCOUNTER — Emergency Department: Payer: Medicare Other | Admitting: Anesthesiology

## 2017-01-08 ENCOUNTER — Encounter
Admission: EM | Disposition: A | Discharge status: Home Health | Payer: Self-pay | Source: Home / Self Care | Attending: Surgery

## 2017-01-08 ENCOUNTER — Inpatient Hospital Stay: Payer: Medicare Other

## 2017-01-08 DIAGNOSIS — I110 Hypertensive heart disease with heart failure: Secondary | ICD-10-CM | POA: Diagnosis present

## 2017-01-08 DIAGNOSIS — D72829 Elevated white blood cell count, unspecified: Secondary | ICD-10-CM | POA: Diagnosis present

## 2017-01-08 DIAGNOSIS — I10 Essential (primary) hypertension: Secondary | ICD-10-CM | POA: Diagnosis not present

## 2017-01-08 DIAGNOSIS — J9601 Acute respiratory failure with hypoxia: Secondary | ICD-10-CM | POA: Diagnosis not present

## 2017-01-08 DIAGNOSIS — K439 Ventral hernia without obstruction or gangrene: Secondary | ICD-10-CM

## 2017-01-08 DIAGNOSIS — H401190 Primary open-angle glaucoma, unspecified eye, stage unspecified: Secondary | ICD-10-CM | POA: Diagnosis present

## 2017-01-08 DIAGNOSIS — IMO0002 Reserved for concepts with insufficient information to code with codable children: Secondary | ICD-10-CM | POA: Diagnosis present

## 2017-01-08 DIAGNOSIS — M7989 Other specified soft tissue disorders: Secondary | ICD-10-CM | POA: Diagnosis present

## 2017-01-08 DIAGNOSIS — I4891 Unspecified atrial fibrillation: Secondary | ICD-10-CM | POA: Diagnosis present

## 2017-01-08 DIAGNOSIS — R350 Frequency of micturition: Secondary | ICD-10-CM | POA: Diagnosis present

## 2017-01-08 DIAGNOSIS — Z6837 Body mass index (BMI) 37.0-37.9, adult: Secondary | ICD-10-CM | POA: Diagnosis not present

## 2017-01-08 DIAGNOSIS — H538 Other visual disturbances: Secondary | ICD-10-CM | POA: Diagnosis present

## 2017-01-08 DIAGNOSIS — N401 Enlarged prostate with lower urinary tract symptoms: Secondary | ICD-10-CM | POA: Diagnosis present

## 2017-01-08 DIAGNOSIS — Z87891 Personal history of nicotine dependence: Secondary | ICD-10-CM | POA: Diagnosis not present

## 2017-01-08 DIAGNOSIS — D62 Acute posthemorrhagic anemia: Secondary | ICD-10-CM | POA: Diagnosis present

## 2017-01-08 DIAGNOSIS — I82409 Acute embolism and thrombosis of unspecified deep veins of unspecified lower extremity: Secondary | ICD-10-CM | POA: Diagnosis not present

## 2017-01-08 DIAGNOSIS — Y92019 Unspecified place in single-family (private) house as the place of occurrence of the external cause: Secondary | ICD-10-CM | POA: Diagnosis not present

## 2017-01-08 DIAGNOSIS — E1139 Type 2 diabetes mellitus with other diabetic ophthalmic complication: Secondary | ICD-10-CM | POA: Diagnosis present

## 2017-01-08 DIAGNOSIS — T8131XA Disruption of external operation (surgical) wound, not elsewhere classified, initial encounter: Secondary | ICD-10-CM | POA: Diagnosis present

## 2017-01-08 DIAGNOSIS — R600 Localized edema: Secondary | ICD-10-CM | POA: Diagnosis not present

## 2017-01-08 DIAGNOSIS — E11319 Type 2 diabetes mellitus with unspecified diabetic retinopathy without macular edema: Secondary | ICD-10-CM | POA: Diagnosis present

## 2017-01-08 DIAGNOSIS — E785 Hyperlipidemia, unspecified: Secondary | ICD-10-CM | POA: Diagnosis present

## 2017-01-08 DIAGNOSIS — I82403 Acute embolism and thrombosis of unspecified deep veins of lower extremity, bilateral: Secondary | ICD-10-CM | POA: Diagnosis present

## 2017-01-08 DIAGNOSIS — I82412 Acute embolism and thrombosis of left femoral vein: Secondary | ICD-10-CM | POA: Diagnosis present

## 2017-01-08 DIAGNOSIS — T8130XA Disruption of wound, unspecified, initial encounter: Secondary | ICD-10-CM | POA: Diagnosis present

## 2017-01-08 DIAGNOSIS — I5032 Chronic diastolic (congestive) heart failure: Secondary | ICD-10-CM | POA: Diagnosis present

## 2017-01-08 DIAGNOSIS — E669 Obesity, unspecified: Secondary | ICD-10-CM | POA: Diagnosis present

## 2017-01-08 DIAGNOSIS — Z833 Family history of diabetes mellitus: Secondary | ICD-10-CM | POA: Diagnosis not present

## 2017-01-08 DIAGNOSIS — E1136 Type 2 diabetes mellitus with diabetic cataract: Secondary | ICD-10-CM | POA: Diagnosis present

## 2017-01-08 DIAGNOSIS — H47012 Ischemic optic neuropathy, left eye: Secondary | ICD-10-CM | POA: Diagnosis present

## 2017-01-08 DIAGNOSIS — G4730 Sleep apnea, unspecified: Secondary | ICD-10-CM | POA: Diagnosis present

## 2017-01-08 DIAGNOSIS — E119 Type 2 diabetes mellitus without complications: Secondary | ICD-10-CM | POA: Diagnosis not present

## 2017-01-08 HISTORY — PX: SECONDARY CLOSURE OF WOUND: SHX6208

## 2017-01-08 LAB — COMPREHENSIVE METABOLIC PANEL
ALT: 17 U/L (ref 17–63)
ALT: 18 U/L (ref 17–63)
ANION GAP: 7 (ref 5–15)
ANION GAP: 5 (ref 5–15)
AST: 26 U/L (ref 15–41)
AST: 21 U/L (ref 15–41)
Albumin: 2.6 g/dL — ABNORMAL LOW (ref 3.5–5.0)
Albumin: 2.6 g/dL — ABNORMAL LOW (ref 3.5–5.0)
Alkaline Phosphatase: 55 U/L (ref 38–126)
Alkaline Phosphatase: 59 U/L (ref 38–126)
BILIRUBIN TOTAL: 0.7 mg/dL (ref 0.3–1.2)
BUN: 5 mg/dL — ABNORMAL LOW (ref 6–20)
BUN: <5 mg/dL — ABNORMAL LOW (ref 6–20)
CALCIUM: 9.1 mg/dL (ref 8.9–10.3)
CHLORIDE: 104 mmol/L (ref 101–111)
CO2: 28 mmol/L (ref 22–32)
CO2: 31 mmol/L (ref 22–32)
CREATININE: 0.72 mg/dL (ref 0.61–1.24)
Calcium: 8.8 mg/dL — ABNORMAL LOW (ref 8.9–10.3)
Chloride: 103 mmol/L (ref 101–111)
Creatinine, Ser: 0.75 mg/dL (ref 0.61–1.24)
Glucose, Bld: 199 mg/dL — ABNORMAL HIGH (ref 65–99)
Glucose, Bld: 161 mg/dL — ABNORMAL HIGH (ref 65–99)
Potassium: 3.7 mmol/L (ref 3.5–5.1)
Potassium: 3.7 mmol/L (ref 3.5–5.1)
SODIUM: 138 mmol/L (ref 135–145)
SODIUM: 140 mmol/L (ref 135–145)
TOTAL PROTEIN: 5.5 g/dL — AB (ref 6.5–8.1)
TOTAL PROTEIN: 5.3 g/dL — AB (ref 6.5–8.1)
Total Bilirubin: 0.8 mg/dL (ref 0.3–1.2)

## 2017-01-08 LAB — URINALYSIS, ROUTINE W REFLEX MICROSCOPIC
BILIRUBIN URINE: NEGATIVE
GLUCOSE, UA: 50 mg/dL — AB
Hgb urine dipstick: NEGATIVE
KETONES UR: NEGATIVE mg/dL
Leukocytes, UA: NEGATIVE
NITRITE: NEGATIVE
PH: 7 (ref 5.0–8.0)
Protein, ur: NEGATIVE mg/dL
Specific Gravity, Urine: 1.005 (ref 1.005–1.030)

## 2017-01-08 LAB — CBC WITH DIFFERENTIAL/PLATELET
BASOS PCT: 0 %
Band Neutrophils: 0 %
Basophils Absolute: 0 10*3/uL (ref 0–0.1)
Blasts: 0 %
EOS PCT: 2 %
Eosinophils Absolute: 0.3 10*3/uL (ref 0–0.7)
HCT: 24 % — ABNORMAL LOW (ref 40.0–52.0)
Hemoglobin: 7.7 g/dL — ABNORMAL LOW (ref 13.0–18.0)
LYMPHS PCT: 10 %
Lymphs Abs: 1.4 10*3/uL (ref 1.0–3.6)
MCH: 27.4 pg (ref 26.0–34.0)
MCHC: 32 g/dL (ref 32.0–36.0)
MCV: 85.5 fL (ref 80.0–100.0)
MONO ABS: 0.8 10*3/uL (ref 0.2–1.0)
Metamyelocytes Relative: 0 %
Monocytes Relative: 6 %
Myelocytes: 0 %
NEUTROS PCT: 82 %
NRBC: 4 /100{WBCs} — AB
Neutro Abs: 11.5 10*3/uL — ABNORMAL HIGH (ref 1.4–6.5)
OTHER: 0 %
PROMYELOCYTES ABS: 0 %
Platelets: 397 10*3/uL (ref 150–440)
RBC: 2.81 MIL/uL — AB (ref 4.40–5.90)
RDW: 16.9 % — ABNORMAL HIGH (ref 11.5–14.5)
WBC: 14 10*3/uL — ABNORMAL HIGH (ref 3.8–10.6)

## 2017-01-08 LAB — URINALYSIS, COMPLETE (UACMP) WITH MICROSCOPIC
BILIRUBIN URINE: NEGATIVE
HGB URINE DIPSTICK: NEGATIVE
KETONES UR: NEGATIVE mg/dL
LEUKOCYTES UA: NEGATIVE
Nitrite: NEGATIVE
PH: 8 (ref 5.0–8.0)
Protein, ur: 30 mg/dL — AB
SQUAMOUS EPITHELIAL / LPF: NONE SEEN
Specific Gravity, Urine: 1.003 — ABNORMAL LOW (ref 1.005–1.030)

## 2017-01-08 LAB — CBC
HCT: 25 % — ABNORMAL LOW (ref 40.0–52.0)
Hemoglobin: 7.9 g/dL — ABNORMAL LOW (ref 13.0–18.0)
MCH: 27.2 pg (ref 26.0–34.0)
MCHC: 31.8 g/dL — AB (ref 32.0–36.0)
MCV: 85.6 fL (ref 80.0–100.0)
Platelets: 360 10*3/uL (ref 150–440)
RBC: 2.92 MIL/uL — ABNORMAL LOW (ref 4.40–5.90)
RDW: 16.9 % — AB (ref 11.5–14.5)
WBC: 14.8 10*3/uL — ABNORMAL HIGH (ref 3.8–10.6)

## 2017-01-08 LAB — LIPASE, BLOOD: LIPASE: 48 U/L (ref 11–51)

## 2017-01-08 LAB — GLUCOSE, CAPILLARY
GLUCOSE-CAPILLARY: 148 mg/dL — AB (ref 65–99)
Glucose-Capillary: 197 mg/dL — ABNORMAL HIGH (ref 65–99)
Glucose-Capillary: 201 mg/dL — ABNORMAL HIGH (ref 65–99)
Glucose-Capillary: 200 mg/dL — ABNORMAL HIGH (ref 65–99)

## 2017-01-08 LAB — PROTIME-INR
INR: 1.11
PROTHROMBIN TIME: 14.3 s (ref 11.4–15.2)

## 2017-01-08 LAB — OSMOLALITY, URINE: Osmolality, Ur: 243 mOsm/kg — ABNORMAL LOW (ref 300–900)

## 2017-01-08 SURGERY — SECONDARY CLOSURE OF WOUND
Anesthesia: General | Site: Abdomen | Wound class: Dirty or Infected

## 2017-01-08 MED ORDER — METFORMIN HCL 500 MG PO TABS
1000.0000 mg | ORAL_TABLET | Freq: Every day | ORAL | Status: DC
  Filled 2017-01-08: qty 2

## 2017-01-08 MED ORDER — INSULIN ASPART 100 UNIT/ML ~~LOC~~ SOLN
0.0000 [IU] | Freq: Three times a day (TID) | SUBCUTANEOUS | Status: DC
  Administered 2017-01-08: 2 [IU] via SUBCUTANEOUS
  Administered 2017-01-08: 5 [IU] via SUBCUTANEOUS
  Administered 2017-01-08: 2 [IU] via SUBCUTANEOUS
  Administered 2017-01-09 – 2017-01-10 (×4): 3 [IU] via SUBCUTANEOUS
  Administered 2017-01-10 – 2017-01-12 (×5): 2 [IU] via SUBCUTANEOUS
  Administered 2017-01-12 – 2017-01-13 (×3): 3 [IU] via SUBCUTANEOUS
  Administered 2017-01-13 (×2): 2 [IU] via SUBCUTANEOUS
  Administered 2017-01-14: 5 [IU] via SUBCUTANEOUS
  Administered 2017-01-14 – 2017-01-15 (×3): 2 [IU] via SUBCUTANEOUS
  Filled 2017-01-08: qty 3
  Filled 2017-01-08 (×2): qty 2
  Filled 2017-01-08: qty 5
  Filled 2017-01-08: qty 1
  Filled 2017-01-08 (×4): qty 3
  Filled 2017-01-08 (×3): qty 2
  Filled 2017-01-08 (×2): qty 3
  Filled 2017-01-08 (×2): qty 2
  Filled 2017-01-08: qty 5
  Filled 2017-01-08 (×5): qty 2

## 2017-01-08 MED ORDER — DIPHENHYDRAMINE HCL 50 MG/ML IJ SOLN: 12.5000 mg | Freq: Four times a day (QID) | INTRAMUSCULAR | Status: DC | PRN

## 2017-01-08 MED ORDER — IPRATROPIUM-ALBUTEROL 0.5-2.5 (3) MG/3ML IN SOLN
3.0000 mL | RESPIRATORY_TRACT | Status: DC
  Administered 2017-01-08 – 2017-01-09 (×9): 3 mL via RESPIRATORY_TRACT
  Filled 2017-01-08 (×9): qty 3

## 2017-01-08 MED ORDER — SUGAMMADEX SODIUM 500 MG/5ML IV SOLN
INTRAVENOUS | Status: AC
  Filled 2017-01-08: qty 5

## 2017-01-08 MED ORDER — PHENYLEPHRINE 40 MCG/ML (10ML) SYRINGE FOR IV PUSH (FOR BLOOD PRESSURE SUPPORT)
PREFILLED_SYRINGE | INTRAVENOUS | Status: AC
  Filled 2017-01-08: qty 10

## 2017-01-08 MED ORDER — TIMOLOL MALEATE 0.5 % OP SOLN
1.0000 [drp] | Freq: Two times a day (BID) | OPHTHALMIC | Status: DC
  Administered 2017-01-08 – 2017-01-15 (×12): 1 [drp] via OPHTHALMIC
  Filled 2017-01-08: qty 5

## 2017-01-08 MED ORDER — DORZOLAMIDE HCL-TIMOLOL MAL 2-0.5 % OP SOLN
1.0000 [drp] | Freq: Two times a day (BID) | OPHTHALMIC | Status: DC
  Filled 2017-01-08 (×2): qty 10

## 2017-01-08 MED ORDER — BUPIVACAINE-EPINEPHRINE 0.25% -1:200000 IJ SOLN
INTRAMUSCULAR | Status: DC | PRN
  Administered 2017-01-08: 30 mL

## 2017-01-08 MED ORDER — HEPARIN SODIUM (PORCINE) 5000 UNIT/ML IJ SOLN
INTRAMUSCULAR | Status: AC
  Filled 2017-01-08: qty 1

## 2017-01-08 MED ORDER — BUPIVACAINE-EPINEPHRINE (PF) 0.25% -1:200000 IJ SOLN
INTRAMUSCULAR | Status: AC
  Filled 2017-01-08: qty 30

## 2017-01-08 MED ORDER — ONDANSETRON HCL 4 MG/2ML IJ SOLN
4.0000 mg | Freq: Four times a day (QID) | INTRAMUSCULAR | Status: DC | PRN
Start: 2017-01-08 — End: 2017-01-09

## 2017-01-08 MED ORDER — DEXTROSE-NACL 5-0.9 % IV SOLN
INTRAVENOUS | Status: DC
  Administered 2017-01-08 – 2017-01-10 (×4): via INTRAVENOUS

## 2017-01-08 MED ORDER — BUDESONIDE 0.25 MG/2ML IN SUSP
0.2500 mg | Freq: Two times a day (BID) | RESPIRATORY_TRACT | Status: DC
  Administered 2017-01-08 – 2017-01-11 (×7): 0.25 mg via RESPIRATORY_TRACT
  Filled 2017-01-08 (×6): qty 2

## 2017-01-08 MED ORDER — ONDANSETRON HCL 4 MG/2ML IJ SOLN
INTRAMUSCULAR | Status: DC | PRN
  Administered 2017-01-08: 4 mg via INTRAVENOUS

## 2017-01-08 MED ORDER — INSULIN ASPART 100 UNIT/ML ~~LOC~~ SOLN: 0.0000 [IU] | Freq: Every day | SUBCUTANEOUS | Status: DC

## 2017-01-08 MED ORDER — SODIUM CHLORIDE 0.9 % IV SOLN: 1.0000 g | Freq: Three times a day (TID) | INTRAVENOUS | Status: DC

## 2017-01-08 MED ORDER — ONDANSETRON HCL 4 MG/2ML IJ SOLN: 4.0000 mg | Freq: Once | INTRAMUSCULAR | Status: DC | PRN

## 2017-01-08 MED ORDER — ROCURONIUM BROMIDE 100 MG/10ML IV SOLN
INTRAVENOUS | Status: DC | PRN
  Administered 2017-01-08: 10 mg via INTRAVENOUS
  Administered 2017-01-08: 20 mg via INTRAVENOUS

## 2017-01-08 MED ORDER — SUCCINYLCHOLINE CHLORIDE 20 MG/ML IJ SOLN
INTRAMUSCULAR | Status: DC | PRN
  Administered 2017-01-08: 100 mg via INTRAVENOUS

## 2017-01-08 MED ORDER — LATANOPROST 0.005 % OP SOLN
1.0000 [drp] | Freq: Every day | OPHTHALMIC | Status: DC
  Administered 2017-01-08 – 2017-01-14 (×7): 1 [drp] via OPHTHALMIC
  Filled 2017-01-08: qty 2.5

## 2017-01-08 MED ORDER — BOOST / RESOURCE BREEZE PO LIQD
1.0000 | Freq: Three times a day (TID) | ORAL | Status: DC
  Administered 2017-01-08 – 2017-01-15 (×19): 1 via ORAL

## 2017-01-08 MED ORDER — PRAVASTATIN SODIUM 40 MG PO TABS
40.0000 mg | ORAL_TABLET | Freq: Every day | ORAL | Status: DC
  Administered 2017-01-08 – 2017-01-15 (×8): 40 mg via ORAL
  Filled 2017-01-08 (×8): qty 1

## 2017-01-08 MED ORDER — PHENYLEPHRINE HCL 10 MG/ML IJ SOLN
INTRAMUSCULAR | Status: AC
  Filled 2017-01-08: qty 1

## 2017-01-08 MED ORDER — FUROSEMIDE 10 MG/ML IJ SOLN
40.0000 mg | Freq: Once | INTRAMUSCULAR | Status: AC
  Administered 2017-01-08: 40 mg via INTRAVENOUS
  Filled 2017-01-08: qty 4

## 2017-01-08 MED ORDER — PROPOFOL 10 MG/ML IV BOLUS
INTRAVENOUS | Status: DC | PRN
  Administered 2017-01-08: 200 mg via INTRAVENOUS

## 2017-01-08 MED ORDER — FENTANYL CITRATE (PF) 100 MCG/2ML IJ SOLN
INTRAMUSCULAR | Status: AC
  Filled 2017-01-08: qty 2

## 2017-01-08 MED ORDER — NALOXONE HCL 0.4 MG/ML IJ SOLN: 0.4000 mg | INTRAMUSCULAR | Status: DC | PRN

## 2017-01-08 MED ORDER — LIDOCAINE HCL (CARDIAC) 20 MG/ML IV SOLN
INTRAVENOUS | Status: DC | PRN
  Administered 2017-01-08: 40 mg via INTRAVENOUS

## 2017-01-08 MED ORDER — DIPHENHYDRAMINE HCL 12.5 MG/5ML PO ELIX: 12.5000 mg | ORAL_SOLUTION | Freq: Four times a day (QID) | ORAL | Status: DC | PRN

## 2017-01-08 MED ORDER — DORZOLAMIDE HCL 2 % OP SOLN
1.0000 [drp] | Freq: Two times a day (BID) | OPHTHALMIC | Status: DC
  Administered 2017-01-08 – 2017-01-15 (×15): 1 [drp] via OPHTHALMIC
  Filled 2017-01-08: qty 10

## 2017-01-08 MED ORDER — SUCCINYLCHOLINE CHLORIDE 200 MG/10ML IV SOSY
PREFILLED_SYRINGE | INTRAVENOUS | Status: AC
  Filled 2017-01-08: qty 10

## 2017-01-08 MED ORDER — PROPOFOL 10 MG/ML IV BOLUS
INTRAVENOUS | Status: AC
  Filled 2017-01-08: qty 20

## 2017-01-08 MED ORDER — LACTATED RINGERS IV SOLN
INTRAVENOUS | Status: DC | PRN
  Administered 2017-01-08: 02:00:00 via INTRAVENOUS

## 2017-01-08 MED ORDER — ADULT MULTIVITAMIN W/MINERALS CH
ORAL_TABLET | Freq: Every day | ORAL | Status: DC
  Administered 2017-01-08 – 2017-01-15 (×8): 1 via ORAL
  Filled 2017-01-08 (×9): qty 1

## 2017-01-08 MED ORDER — CODEINE SULFATE 30 MG PO TABS
15.0000 mg | ORAL_TABLET | ORAL | Status: DC | PRN
  Administered 2017-01-08 – 2017-01-10 (×4): 15 mg via ORAL
  Filled 2017-01-08 (×4): qty 1

## 2017-01-08 MED ORDER — FENTANYL CITRATE (PF) 100 MCG/2ML IJ SOLN: 25.0000 ug | INTRAMUSCULAR | Status: DC | PRN

## 2017-01-08 MED ORDER — HEPARIN SODIUM (PORCINE) 5000 UNIT/ML IJ SOLN
5000.0000 [IU] | Freq: Three times a day (TID) | INTRAMUSCULAR | Status: DC
  Administered 2017-01-08 – 2017-01-11 (×10): 5000 [IU] via SUBCUTANEOUS
  Filled 2017-01-08 (×9): qty 1

## 2017-01-08 MED ORDER — SUGAMMADEX SODIUM 200 MG/2ML IV SOLN
INTRAVENOUS | Status: DC | PRN
  Administered 2017-01-08: 274 mg via INTRAVENOUS

## 2017-01-08 MED ORDER — FENTANYL CITRATE (PF) 100 MCG/2ML IJ SOLN
INTRAMUSCULAR | Status: DC | PRN
  Administered 2017-01-08 (×4): 50 ug via INTRAVENOUS

## 2017-01-08 MED ORDER — MEROPENEM-SODIUM CHLORIDE 1 GM/50ML IV SOLR
1.0000 g | Freq: Three times a day (TID) | INTRAVENOUS | Status: DC
  Administered 2017-01-08 – 2017-01-11 (×10): 1 g via INTRAVENOUS
  Filled 2017-01-08 (×14): qty 50

## 2017-01-08 MED ORDER — SODIUM CHLORIDE 0.9% FLUSH: 9.0000 mL | INTRAVENOUS | Status: DC | PRN

## 2017-01-08 MED ORDER — FOLIC ACID 1 MG PO TABS
1.0000 mg | ORAL_TABLET | Freq: Every day | ORAL | Status: DC
  Administered 2017-01-08 – 2017-01-15 (×8): 1 mg via ORAL
  Filled 2017-01-08 (×8): qty 1

## 2017-01-08 MED ORDER — OMEGA-3-ACID ETHYL ESTERS 1 G PO CAPS
2.0000 g | ORAL_CAPSULE | Freq: Every day | ORAL | Status: DC
  Administered 2017-01-08 – 2017-01-15 (×8): 2 g via ORAL
  Filled 2017-01-08 (×8): qty 2

## 2017-01-08 MED ORDER — AMLODIPINE BESYLATE 5 MG PO TABS
5.0000 mg | ORAL_TABLET | Freq: Every day | ORAL | Status: DC
  Administered 2017-01-08 – 2017-01-15 (×8): 5 mg via ORAL
  Filled 2017-01-08 (×8): qty 1

## 2017-01-08 MED ORDER — FERROUS GLUCONATE 324 (38 FE) MG PO TABS
324.0000 mg | ORAL_TABLET | Freq: Two times a day (BID) | ORAL | Status: DC
  Administered 2017-01-08 – 2017-01-15 (×15): 324 mg via ORAL
  Filled 2017-01-08 (×15): qty 1

## 2017-01-08 MED ORDER — CIPROFLOXACIN IN D5W 400 MG/200ML IV SOLN
400.0000 mg | Freq: Once | INTRAVENOUS | Status: AC
  Administered 2017-01-08: 400 mg via INTRAVENOUS
  Filled 2017-01-08: qty 200

## 2017-01-08 MED ORDER — VITAMIN D 1000 UNITS PO TABS
2000.0000 [IU] | ORAL_TABLET | Freq: Every day | ORAL | Status: DC
  Administered 2017-01-08 – 2017-01-15 (×8): 2000 [IU] via ORAL
  Filled 2017-01-08 (×9): qty 2

## 2017-01-08 MED ORDER — DOCUSATE SODIUM 100 MG PO CAPS
100.0000 mg | ORAL_CAPSULE | Freq: Every day | ORAL | Status: DC
Start: 2017-01-08 — End: 2017-01-15
  Administered 2017-01-08 – 2017-01-15 (×8): 100 mg via ORAL
  Filled 2017-01-08 (×8): qty 1

## 2017-01-08 MED ORDER — ONDANSETRON HCL 4 MG/2ML IJ SOLN
INTRAMUSCULAR | Status: AC
  Filled 2017-01-08: qty 2

## 2017-01-08 MED ORDER — PHENYLEPHRINE HCL 10 MG/ML IJ SOLN
INTRAMUSCULAR | Status: DC | PRN
  Administered 2017-01-08 (×4): 80 ug via INTRAVENOUS

## 2017-01-08 MED ORDER — HYDROMORPHONE 1 MG/ML IV SOLN
INTRAVENOUS | Status: DC
  Administered 2017-01-08: 0.5 mg via INTRAVENOUS
  Administered 2017-01-08: 0 mg via INTRAVENOUS
  Administered 2017-01-08: 1.5 mg via INTRAVENOUS
  Administered 2017-01-08: 04:00:00 via INTRAVENOUS
  Administered 2017-01-09: 0.9 mg via INTRAVENOUS
  Administered 2017-01-09: 0 mg via INTRAVENOUS
  Administered 2017-01-09: 0.6 mg via INTRAVENOUS
  Administered 2017-01-09: 0.3 mg via INTRAVENOUS
  Filled 2017-01-08: qty 25

## 2017-01-08 SURGICAL SUPPLY — 30 items
CANISTER SUCT 1200ML W/VALVE (MISCELLANEOUS) ×4 IMPLANT
CATH TRAY 16F METER LATEX (MISCELLANEOUS) ×4 IMPLANT
CHLORAPREP W/TINT 26ML (MISCELLANEOUS) ×4 IMPLANT
DRAIN PENROSE 1/4X12 LTX (DRAIN) ×4 IMPLANT
DRAPE LAPAROTOMY 100X77 ABD (DRAPES) ×4 IMPLANT
DRSG TELFA 3X8 NADH (GAUZE/BANDAGES/DRESSINGS) IMPLANT
ELECT REM PT RETURN 9FT ADLT (ELECTROSURGICAL) ×4
ELECTRODE REM PT RTRN 9FT ADLT (ELECTROSURGICAL) ×2 IMPLANT
GAUZE SPONGE 4X4 12PLY STRL (GAUZE/BANDAGES/DRESSINGS) IMPLANT
GLOVE BIO SURGEON STRL SZ8 (GLOVE) ×16 IMPLANT
GOWN STRL REUS W/ TWL LRG LVL3 (GOWN DISPOSABLE) ×4 IMPLANT
GOWN STRL REUS W/TWL LRG LVL3 (GOWN DISPOSABLE) ×4
KIT RM TURNOVER STRD PROC AR (KITS) ×4 IMPLANT
LABEL OR SOLS (LABEL) ×4 IMPLANT
NDL SAFETY 22GX1.5 (NEEDLE) ×4 IMPLANT
NS IRRIG 1000ML POUR BTL (IV SOLUTION) ×4 IMPLANT
PACK BASIN MAJOR ARMC (MISCELLANEOUS) ×4 IMPLANT
PACK COLON CLEAN CLOSURE (MISCELLANEOUS) IMPLANT
PAD ABD DERMACEA PRESS 5X9 (GAUZE/BANDAGES/DRESSINGS) ×16 IMPLANT
SEPRAFILM MEMBRANE 5X6 (MISCELLANEOUS) IMPLANT
STAPLER SKIN PROX 35W (STAPLE) ×4 IMPLANT
SUT ETHILON NAB BLK LR #2 30IN (SUTURE) ×20 IMPLANT
SUT PDS AB 1 TP1 54 (SUTURE) ×8 IMPLANT
SUT PROLENE 0 CT 1 30 (SUTURE) IMPLANT
SUT SILK 3-0 (SUTURE) ×8 IMPLANT
SUT VIC AB 3-0 SH 27 (SUTURE)
SUT VIC AB 3-0 SH 27X BRD (SUTURE) IMPLANT
SUT VICRYL 2 0 18  UND BR (SUTURE)
SUT VICRYL 2 0 18 UND BR (SUTURE) IMPLANT
SYRINGE 10CC LL (SYRINGE) ×4 IMPLANT

## 2017-01-08 NOTE — ED Provider Notes (Signed)
[facility] Emergency Department Provider Note  ____________________________________________   First MD Initiated Contact with Patient 01/08/17 [phone]     (approximate)  I have reviewed the triage vital signs and the nursing notes.   HISTORY  Chief Complaint Post-op Problem    HPI Evan [name] is a 72 y.o. male who recently underwent an exploratory laparotomy with sigmoid colectomy and primary anastomosis.  He presents by EMS for evaluation of a popping sensation and tissue poking through his surgical wound.  He states that he has had a persistent nonproductive cough since the surgerythat "can get strong sometimes".  He was coughing tonight when he felt a popping sensation and saw tissue protruding from the wound.  He denies any acute pain.  It happened acutely just prior to arrival so he has not yet had a bowel movement.  His only other complaints besides the persistent cough is that he has been urinating heavily since the procedure but has not no dysuria.  Review of systems is otherwise negative as listed below.   Past Medical History:  Diagnosis Date  . Atrial fibrillation ([place])   . BPH (benign prostatic hyperplasia)   . CHF (congestive heart failure) ([place])   . Diabetes mellitus without complication ([place])   . Diverticulosis   . Hyperlipidemia   . Hypertension   . Sleep apnea     Patient Active Problem List   Diagnosis Date Noted  . Evisceration of bowel 01/08/2017  . Acute posthemorrhagic anemia 01/06/2017  . Encounter for blood transfusion 01/06/2017  . S/P exploratory laparotomy 01/06/2017  . S/P laparoscopic-assisted sigmoidectomy 01/06/2017  . Leukocytosis 01/06/2017  . Hypokalemia 01/06/2017  . Acute lower GI bleeding   . History of GI diverticular bleed   . BRBPR (bright red blood per rectum) 12/27/2016    Past Surgical History:  Procedure Laterality Date  . ATRIAL ABLATION SURGERY    . CHOLECYSTECTOMY    . LAPAROTOMY  01/01/2017   Procedure: EXPLORATORY LAPAROTOMY;  Surgeon: Olean Ree, MD;  Location: [facility] ORS;  Service: General;;  . PARTIAL COLECTOMY N/A 01/01/2017   Procedure: PARTIAL COLECTOMY;  Surgeon: Olean Ree, MD;  Location: [facility] ORS;  Service: General;  Laterality: N/A;  . PERIPHERAL VASCULAR CATHETERIZATION N/A 12/28/2016   Procedure: Visceral Artery Intervention;  Surgeon: Algernon Huxley, MD;  Location: [facility];  Service: Cardiovascular;  Laterality: N/A;  . TOTAL KNEE ARTHROPLASTY Right     Prior to Admission medications   Medication Sig Start Date End Date Taking? Authorizing Provider  acetaminophen (TYLENOL) 500 MG tablet Take 1,000 mg by mouth every 6 (six) hours as needed.   Yes Historical Provider, MD  amLODipine (NORVASC) 5 MG tablet Take 5 mg by mouth daily. 05/03/16  Yes Historical Provider, MD  Cholecalciferol (VITAMIN D3) 2000 units capsule Take 2,000 Units by mouth daily.   Yes Historical Provider, MD  docusate calcium (SURFAK) 240 MG capsule Take 1 capsule by mouth daily.   Yes Historical Provider, MD  dorzolamide-timolol (COSOPT) 22.3-6.8 MG/ML ophthalmic solution Apply 1 drop to eye 2 (two) times daily.   Yes Historical Provider, MD  feeding supplement (BOOST / RESOURCE BREEZE) LIQD Take 1 Container by mouth 3 (three) times daily between meals. 01/06/17  Yes Theodoro Grist, MD  ferrous gluconate (FERGON) 324 MG tablet Take 1 tablet (324 mg total) by mouth 2 (two) times daily with a meal. 01/06/17  Yes Theodoro Grist, MD  latanoprost (XALATAN) 0.005 % ophthalmic solution Apply 1 drop to  eye at bedtime.   Yes Historical Provider, MD  metFORMIN (GLUCOPHAGE) 1000 MG tablet Take 1,000 mg by mouth daily with breakfast.  05/14/16 05/14/17 Yes Historical Provider, MD  Multiple Vitamins-Minerals (MULTIVITAMIN ADULT PO) Take 1 tablet by mouth daily.   Yes Historical Provider, MD  Omega-3 Fatty Acids (FISH OIL) 1200 MG CAPS Take 2 capsules by mouth daily.   Yes Historical Provider, MD  pravastatin  (PRAVACHOL) 40 MG tablet Take 40 mg by mouth daily. 05/03/16  Yes Historical Provider, MD    Allergies Azithromycin; Penicillins; Lisinopril; Losartan; and Metoprolol  Family History  Problem Relation Age of Onset  . Diabetes Sister   . Diabetes Brother   . Diabetes Sister   . Diabetes Brother     Social History Social History  Substance Use Topics  . Smoking status: Former Smoker    Packs/day: 0.25    Years: 29.00    Types: Cigarettes    Quit date: 12/31/1993  . Smokeless tobacco: Never Used  . Alcohol use No    Review of Systems Constitutional: No fever/chills Eyes: No visual changes. ENT: No sore throat. Cardiovascular: Denies chest pain. Respiratory: Denies shortness of breath.  Persistent non-productive cough. Gastrointestinal: Mild post-operative abdominal pain.  No nausea, no vomiting.  No diarrhea.  No constipation. Genitourinary: Negative for dysuria.  +Polyuria Musculoskeletal: Negative for back pain. Skin: Negative for rash. Neurological: Negative for headaches, focal weakness or numbness.  10-point ROS otherwise negative.  ____________________________________________   PHYSICAL EXAM:  VITAL SIGNS: ED Triage Vitals  Enc Vitals Group     BP 01/07/17 2354 (!) 166/86     Pulse Rate 01/07/17 2354 (!) 114     Resp 01/07/17 2354 (!) 22     Temp 01/07/17 2354 98.4 F (36.9 C)     Temp Source 01/07/17 2354 Oral     SpO2 01/07/17 2354 93 %     Weight 01/07/17 2355 (!) 302 lb (137 kg)     Height 01/07/17 2355 6\' 3"  (1.905 m)     Head Circumference --      Peak Flow --      Pain Score 01/07/17 2355 0     Pain Loc --      Pain Edu? --      Excl. in [place]? --     Constitutional: Alert and oriented. Well appearing and in no acute distress. Eyes: Conjunctivae are normal. PERRL. EOMI. Head: Atraumatic. Nose: No congestion/rhinnorhea. Mouth/Throat: Mucous membranes are moist.  Oropharynx non-erythematous. Neck: No stridor.  No meningeal signs.     Cardiovascular: Normal rate, regular rhythm. Good peripheral circulation. Grossly normal heart sounds. Respiratory: Normal respiratory effort.  No retractions. Lungs CTAB. Gastrointestinal: Soft, non-tender to palpation.  Approx 8-cm infraumbilical wound dehiscence with bowel extravasation.  The bowel is pink and healthy appearing with no signs of ischemia at this point.  There is another area of dehiscence proximal to the large wound which is on the patient's right side of his umbilicus which is approximately 1 cm in length.  There is no purulent discharge and no bleeding. Musculoskeletal: No lower extremity tenderness nor edema. No gross deformities of extremities. Neurologic:  Normal speech and language. No gross focal neurologic deficits are appreciated.  Skin:  Skin is warm, dry and intact. No rash noted. Psychiatric: Mood and affect are normal. Speech and behavior are normal.  ____________________________________________   LABS (all labs ordered are listed, but only abnormal results are displayed)  Labs Reviewed  CBC WITH DIFFERENTIAL/PLATELET -  Abnormal; Notable for the following:       Result Value   WBC 14.0 (*)    RBC 2.81 (*)    Hemoglobin 7.7 (*)    HCT 24.0 (*)    RDW 16.9 (*)    nRBC 4 (*)    Neutro Abs 11.5 (*)    All other components within normal limits  COMPREHENSIVE METABOLIC PANEL - Abnormal; Notable for the following:    Glucose, Bld 199 (*)    BUN 5 (*)    Total Protein 5.5 (*)    Albumin 2.6 (*)    All other components within normal limits  PROTIME-INR  LIPASE, BLOOD  CBC  CREATININE, SERUM  COMPREHENSIVE METABOLIC PANEL  CBC  URINALYSIS, ROUTINE W REFLEX MICROSCOPIC   ____________________________________________  EKG  None - EKG not ordered by ED physician ____________________________________________  RADIOLOGY   No results found.  ____________________________________________   PROCEDURES  Procedure(s) performed:    Procedures   Critical Care performed: No ____________________________________________   INITIAL IMPRESSION / ASSESSMENT AND PLAN / ED COURSE  Pertinent labs & imaging results that were available during my care of the patient were reviewed by me and considered in my medical decision making (see chart for details).  Obvious wound dehiscence with what appears to be bowel extravasation.  No ischemic appearance at this time.  I reviewed the operative note.  I took a Clinical research associate with [employer] which can be found in the Media tab of Chart Review.   Clinical Course as of Jan 08 425  Tue Jan 08, 2017  0038 Called Dr. Burt Knack, spoke by phone, he will come to the ED to evaluate the probable bowel extravasation and wound dehiscence.  [CF]  [id number] Dr. Burt Knack spoke with me in person.  He is admitting the patient for emergency surgery to further manage the wound.  [CF]    Clinical Course User Index [CF] Hinda Kehr, MD    ____________________________________________  FINAL CLINICAL IMPRESSION(S) / ED DIAGNOSES  Final diagnoses:  Wound dehiscence  Other postoperative complication involving digestive system     MEDICATIONS GIVEN DURING THIS VISIT:  Medications  heparin injection 5,000 Units (5,000 Units Subcutaneous Given 01/08/17 0147)  amLODipine (NORVASC) tablet 5 mg (not administered)  pravastatin (PRAVACHOL) tablet 40 mg (not administered)  metFORMIN (GLUCOPHAGE) tablet 1,000 mg (not administered)  dorzolamide-timolol (COSOPT) 22.3-6.8 MG/ML ophthalmic solution 1 drop (not administered)  latanoprost (XALATAN) 0.005 % ophthalmic solution 1 drop (not administered)  omega-3 acid ethyl esters (LOVAZA) capsule 2 g (not administered)  docusate sodium (COLACE) capsule 100 mg (not administered)  cholecalciferol (VITAMIN D) tablet 2,000 Units (not administered)  multivitamin with minerals tablet (not administered)  feeding supplement (BOOST / RESOURCE BREEZE) liquid 1 Container (not administered)   ferrous gluconate (FERGON) tablet 324 mg (not administered)  dextrose 5 %-0.9 % sodium chloride infusion (not administered)  folic acid (FOLVITE) tablet 1 mg (not administered)  HYDROmorphone (DILAUDID) 1 mg/mL PCA injection (not administered)  naloxone (NARCAN) injection 0.4 mg (not administered)    And  sodium chloride flush (NS) 0.9 % injection 9 mL (not administered)  ondansetron (ZOFRAN) injection 4 mg (not administered)  diphenhydrAMINE (BENADRYL) injection 12.5 mg (not administered)    Or  diphenhydrAMINE (BENADRYL) 12.5 MG/5ML elixir 12.5 mg (not administered)  ciprofloxacin (CIPRO) IVPB 400 mg (400 mg Intravenous Given 01/08/17 0159)     NEW OUTPATIENT MEDICATIONS STARTED DURING THIS VISIT:  Current Discharge Medication List      Current Discharge Medication List  Current Discharge Medication List       Note:  This document was prepared using Dragon voice recognition software and may include unintentional dictation errors.    Hinda Kehr, MD 01/08/17 [id number]

## 2017-01-08 NOTE — Progress Notes (Signed)
[place] [facility] [place] NAME: Evan [name]    MR#:  [id number]  DATE OF BIRTH:  11-Mar-1945  SUBJECTIVE:  CHIEF COMPLAINT:   Chief Complaint  Patient presents with  . Post-op Problem  Patient has a 72 year old African male with past medical history significant for history of recent admission for gastrointestinal bleed, who underwent vascular procedure and later laparoscopic colectomy came back to the hospital after he had evisceration of bowel with severe cough episode/paroxysm at home. Patient was taken to surgery by Dr. Burt Knack and bowel was repositioned. No significant injury was noted, although some serosal tears were noted. Patient was initiated on antibiotic therapy and admits to the hospital for further evaluation and treatment. He feels comfortable today, denies any significant pain, admits of intermittent dry cough. Chest x-ray was normal. He also complains of significant urinary output, lower extremity swelling.  Review of Systems  Constitutional: Negative for chills, fever and weight loss.  HENT: Negative for congestion.   Eyes: Negative for blurred vision and double vision.  Respiratory: Negative for cough, sputum production, shortness of breath and wheezing.   Cardiovascular: Negative for chest pain, palpitations, orthopnea, leg swelling and PND.  Gastrointestinal: Positive for abdominal pain. Negative for blood in stool, constipation, diarrhea, nausea and vomiting.  Genitourinary: Negative for dysuria, frequency, hematuria and urgency.  Musculoskeletal: Negative for falls.  Neurological: Negative for dizziness, tremors, focal weakness and headaches.  Endo/Heme/Allergies: Does not bruise/bleed easily.  Psychiatric/Behavioral: Negative for depression. The patient does not have insomnia.     VITAL SIGNS: Blood pressure 133/69, pulse (!) 102, temperature (!) 101.2 F (38.4 C), temperature source Oral, resp. rate (!) 24, height 6'  3" (1.905 m), weight (!) 137 kg (302 lb), SpO2 100 %.  PHYSICAL EXAMINATION:   GENERAL:  72 y.o.-year-old patient lying in the bed with no acute distress.  EYES: Pupils equal, round, reactive to light and accommodation. No scleral icterus. Extraocular muscles intact.  HEENT: Head atraumatic, normocephalic. Oropharynx and nasopharynx clear.  NECK:  Supple, no jugular venous distention. No thyroid enlargement, no tenderness.  LUNGS: Normal breath sounds bilaterally, no wheezing, rales,rhonchi or crepitation. No use of accessory muscles of respiration.  CARDIOVASCULAR: S1, S2 normal. No murmurs, rubs, or gallops.  ABDOMEN: Soft, nontender, nondistended, bandaged. Bowel sounds present, diminished . No organomegaly or mass.  EXTREMITIES: 2+ Bilateral lower extremity/lower third of tibial and pedal edema, no cyanosis, or clubbing.  NEUROLOGIC: Cranial nerves II through XII are intact. Muscle strength 5/5 in all extremities. Sensation intact. Gait not checked.  PSYCHIATRIC: The patient is alert and oriented x 3.  SKIN: No obvious rash, lesion, or ulcer.   ORDERS/RESULTS REVIEWED:   CBC  Recent Labs Lab 01/02/17 0438 01/03/17 0441  01/04/17 0429 01/05/17 0527 01/06/17 0606 01/08/17 0102 01/08/17 0503  WBC 18.0* 17.5*  --  15.8* 12.1* 9.8 14.0* 14.8*  HGB 8.2* 6.2*  < > 7.8* 7.0* 7.2* 7.7* 7.9*  HCT 24.4* 18.9*  < > 23.1* 21.3* 21.6* 24.0* 25.0*  PLT 139* 160  --  192 219 261 397 360  MCV 86.0 87.7  --  86.3 86.4 86.1 85.5 85.6  MCH 28.8 28.7  --  29.0 28.3 28.6 27.4 27.2  MCHC 33.4 32.7  --  33.6 32.8 33.2 32.0 31.8*  RDW 15.1* 15.1*  --  16.5* 15.9* 16.3* 16.9* 16.9*  LYMPHSABS 1.6 3.0  --  1.6  --   --  1.4  --   MONOABS  1.8* 0.4  --  0.8  --   --  0.8  --   EOSABS 0.4 0.4  --  0.0  --   --  0.3  --   BASOSABS 0.0 0.0  --  0.0  --   --  0.0  --   < > = values in this interval not  displayed. ------------------------------------------------------------------------------------------------------------------  Chemistries   Recent Labs Lab 01/03/17 0441 01/04/17 0429 01/05/17 0527 01/08/17 0102 01/08/17 0503  NA 140 139 138 138 140  K 3.5 3.8 3.2* 3.7 3.7  CL 108 108 105 103 104  CO2 27 27 28 28 31   GLUCOSE 128* 105* 124* 199* 161*  BUN 8 8 7  5* <5*  CREATININE 0.77 0.65 0.55* 0.72 0.75  CALCIUM 7.7* 7.8* 8.0* 9.1 8.8*  MG  --  1.8  --   --   --   AST  --   --   --  26 21  ALT  --   --   --  17 18  ALKPHOS  --   --   --  55 59  BILITOT  --   --   --  0.7 0.8   ------------------------------------------------------------------------------------------------------------------ estimated creatinine clearance is 124.5 mL/min (by C-G formula based on SCr of 0.75 mg/dL). ------------------------------------------------------------------------------------------------------------------ No results for input(s): TSH, T4TOTAL, T3FREE, THYROIDAB in the last 72 hours.  Invalid input(s): FREET3  Cardiac Enzymes No results for input(s): CKMB, TROPONINI, MYOGLOBIN in the last 168 hours.  Invalid input(s): CK ------------------------------------------------------------------------------------------------------------------ Invalid input(s): POCBNP ---------------------------------------------------------------------------------------------------------------  RADIOLOGY: No results found.  EKG:  Orders placed or performed during the hospital encounter of 12/27/16  . ED EKG  . ED EKG  . EKG 12-Lead  . EKG 12-Lead    ASSESSMENT AND PLAN:  Active Problems:   Evisceration of bowel  #1. Bowel evisceration, status post operation 01/07/2017, patient is being continued on IV antibiotics, supportive therapy by surgery #2. Polyuria, could be related to diabetes mellitus, although patient's blood glucose levels are not very high, getting urine osmolarity #3. Bilateral  lower extremity swelling, rule out DVT, get Doppler ultrasound #4. Leukocytosis, follow with therapy #5. Posthemorrhagic anemia, status post multiple transfusions in the past,  relatively stable, transfuse if needed  Management plans discussed with the patient, family and they are in agreement.   DRUG ALLERGIES:  Allergies  Allergen Reactions  . Azithromycin Rash  . Penicillins Rash    Has patient had a PCN reaction causing immediate rash, facial/tongue/throat swelling, SOB or lightheadedness with hypotension: Yes Has patient had a PCN reaction causing severe rash involving mucus membranes or skin necrosis: Yes Has patient had a PCN reaction that required hospitalization No Has patient had a PCN reaction occurring within the last 10 years: No If all of the above answers are "NO", then may proceed with Cephalosporin use.   [name] Lisinopril Cough  . Losartan Cough  . Metoprolol Other (See Comments)    Bradycardia.    CODE STATUS:     Code Status Orders        Start     Ordered   01/08/17 0305  Full code  Continuous     01/08/17 0314    Code Status History    Date Active Date Inactive Code Status Order ID Comments User Context   12/27/2016 11:54 AM 01/06/2017  5:26 PM Full Code [id number]  Hillary Bow, MD ED    Advance Directive Documentation   Flowsheet Row Most Recent Value  Type of Advance  Directive  Living will  Pre-existing out of facility DNR order (yellow form or pink MOST form)  No data  "MOST" Form in Place?  No data      TOTAL TIME TAKING CARE OF THIS PATIENT: 40 minutes.    Theodoro Grist M.D on 01/08/2017 at 1:23 PM  Between 7am to 6pm - Pager - [phone]  After 6pm go to [email or web address] - password [email or web address] [place] [facility]  Office  [phone]  CC: Primary care physician; Ezequiel Kayser, MD

## 2017-01-08 NOTE — Transfer of Care (Signed)
Immediate Anesthesia Transfer of Care Note  Patient: Evan [name]  Procedure(s) Performed: Procedure(s): SECONDARY CLOSURE FOR EVISCERATION (N/A)  Patient Location: PACU  Anesthesia Type:General  Level of Consciousness: awake, oriented and patient cooperative  Airway & Oxygen Therapy: Patient Spontanous Breathing and Patient connected to face mask oxygen  Post-op Assessment: Report given to RN and Post -op Vital signs reviewed and stable  Post vital signs: Reviewed and stable  Last Vitals:  Vitals:   01/07/17 2354 01/08/17 0253  BP: (!) 166/86   Pulse: (!) 114   Resp: (!) 22   Temp: 36.9 C (P) 36.2 C    Last Pain:  Vitals:   01/07/17 2355  TempSrc:   PainSc: 0-No pain         Complications: No apparent anesthesia complications

## 2017-01-08 NOTE — Anesthesia Procedure Notes (Signed)
Procedure Name: Intubation Date/Time: 01/08/2017 1:51 AM Performed by: Lendon Colonel Pre-anesthesia Checklist: Emergency Drugs available, Suction available, Patient identified and Patient being monitored Patient Re-evaluated:Patient Re-evaluated prior to inductionOxygen Delivery Method: Circle system utilized Preoxygenation: Pre-oxygenation with 100% oxygen Intubation Type: IV induction, Rapid sequence and Cricoid Pressure applied Laryngoscope Size: Miller and 2 Grade View: Grade I Tube type: Oral Number of attempts: 1 Airway Equipment and Method: Stylet Placement Confirmation: ETT inserted through vocal cords under direct vision,  positive ETCO2 and breath sounds checked- equal and bilateral Secured at: 22 cm Tube secured with: Tape Dental Injury: Teeth and Oropharynx as per pre-operative assessment

## 2017-01-08 NOTE — Progress Notes (Signed)
Pharmacy Antibiotic Note  Evan [name] is a 72 y.o. male admitted on 01/07/2017 with intra-abdominal infection and bowel evisceration s/p operation 01/07/17.  Pt received dose of ciprofloxacin 400 mg IV for surgical prophylaxis. Pharmacy has been consulted for meropenem dosing. Pt has allergy (rash) to penicillins.  Plan: Begin meropenem 1 g q8 hours  Height: 6\' 3"  (190.5 cm) Weight: (!) 302 lb (137 kg) IBW/kg (Calculated) : 84.5  Temp (24hrs), Avg:98.6 F (37 C), Min:97.1 F (36.2 C), Max:101.2 F (38.4 C)   Recent Labs Lab 01/03/17 0441 01/04/17 0429 01/05/17 0527 01/06/17 0606 01/08/17 0102 01/08/17 0503  WBC 17.5* 15.8* 12.1* 9.8 14.0* 14.8*  CREATININE 0.77 0.65 0.55*  --  0.72 0.75    Estimated Creatinine Clearance: 124.5 mL/min (by C-G formula based on SCr of 0.75 mg/dL).    Allergies  Allergen Reactions  . Azithromycin Rash  . Penicillins Rash    Has patient had a PCN reaction causing immediate rash, facial/tongue/throat swelling, SOB or lightheadedness with hypotension: Yes Has patient had a PCN reaction causing severe rash involving mucus membranes or skin necrosis: Yes Has patient had a PCN reaction that required hospitalization No Has patient had a PCN reaction occurring within the last 10 years: No If all of the above answers are "NO", then may proceed with Cephalosporin use.   [name] Lisinopril Cough  . Losartan Cough  . Metoprolol Other (See Comments)    Bradycardia.    Antimicrobials this admission: cipro x 1 Meropenem 1/9 >>   Dose adjustments this admission:  Microbiology results: 1/9 BCx: sent  UCx:   1/9 Sputum: sent   MRSA PCR  Thank you for allowing pharmacy to be a part of this patient's care.  Darrow Bussing, PharmD Pharmacy Resident 01/08/2017 2:05 PM

## 2017-01-08 NOTE — H&P (Signed)
Evan [name] is an 72 y.o. male.    Chief Complaint: Bowel in incision  HPI: This a patient who is status post exploratory laparotomy and sigmoid colon resection for bleeding last week. His had no further bleeding and was at home when he coughed and noticed a pop with signs of bowel coming out of his incision. This happened approximately 11:00 while he is watching a football game and came promptly to the emergency room. He has no abdominal pain he's had some nausea but no vomiting.  2 other significant things in his history and review of systems is that he has had blurred vision since surgery and apparently had a negative CT scan prior to discharge. He has also had considerably high frequency of urination. Both of these will be investigated and proper consultation will be obtained. I will last prime doc to see the patient postoperatively following this emergency procedure described below.  Past Medical History:  Diagnosis Date  . Atrial fibrillation ([place])   . BPH (benign prostatic hyperplasia)   . CHF (congestive heart failure) ([place])   . Diabetes mellitus without complication ([place])   . Diverticulosis   . Hyperlipidemia   . Hypertension   . Sleep apnea     Past Surgical History:  Procedure Laterality Date  . ATRIAL ABLATION SURGERY    . CHOLECYSTECTOMY    . LAPAROTOMY  01/01/2017   Procedure: EXPLORATORY LAPAROTOMY;  Surgeon: Olean Ree, MD;  Location: [facility] OR[facility];  Service: General;;  . PARTIAL COLECTOMY N/A 01/01/2017   Procedure: PARTIAL COLECTOMY;  Surgeon: Olean Ree, MD;  Location: [facility];  Service: General;  Laterality: N/A;  . PERIPHERAL VASCULAR CATHETERIZATION N/A 12/28/2016   Procedure: Visceral Artery Intervention;  Surgeon: Algernon Huxley, MD;  Location: [facility] [place] [facility];  Service: Cardiovascular;  Laterality: N/A;  . TOTAL KNEE ARTHROPLASTY Right     Family History  Problem Relation Age of Onset  . Diabetes Sister   . Diabetes Brother   . Diabetes Sister   .  Diabetes Brother    Social History:  reports that he quit smoking about 23 years ago. His smoking use included Cigarettes. He has a 7.25 pack-year smoking history. He has never used smokeless tobacco. He reports that he does not drink alcohol. His drug history is not on file.  Allergies:  Allergies  Allergen Reactions  . Azithromycin Rash  . Penicillins Rash    Has patient had a PCN reaction causing immediate rash, facial/tongue/throat swelling, SOB or lightheadedness with hypotension: Yes Has patient had a PCN reaction causing severe rash involving mucus membranes or skin necrosis: Yes Has patient had a PCN reaction that required hospitalization No Has patient had a PCN reaction occurring within the last 10 years: No If all of the above answers are "NO", then may proceed with Cephalosporin use.   [name] Lisinopril Cough  . Losartan Cough  . Metoprolol Other (See Comments)    Bradycardia.     (Not in a hospital admission)   Review of Systems  Constitutional: Negative.   HENT: Negative.   Eyes: Positive for blurred vision. Negative for double vision.  Respiratory: Negative.   Cardiovascular: Negative.   Gastrointestinal: Positive for nausea. Negative for abdominal pain, blood in stool, constipation, diarrhea, heartburn, melena and vomiting.  Genitourinary: Positive for frequency. Negative for dysuria, flank pain, hematuria and urgency.  Musculoskeletal: Negative.   Skin: Negative.   Neurological: Negative.   Endo/Heme/Allergies: Negative.   Psychiatric/Behavioral: Negative.      Physical  Exam:  BP (!) 166/86 (BP Location: Left Arm)   Pulse (!) 114   Temp 98.4 F (36.9 C) (Oral)   Resp (!) 22   Ht 6\' 3"  (1.905 m)   Wt (!) 302 lb (137 kg)   SpO2 93%   BMI 37.75 kg/m   Physical Exam  Constitutional: He is oriented to person, place, and time and well-developed, well-nourished, and in no distress. No distress.  HENT:  Head: Normocephalic and atraumatic.  Eyes: Pupils  are equal, round, and reactive to light. Right eye exhibits no discharge. Left eye exhibits no discharge. No scleral icterus.  Neck: Normal range of motion.  Cardiovascular: Normal rate, regular rhythm and normal heart sounds.   Pulmonary/Chest: Effort normal and breath sounds normal. No respiratory distress. He has no wheezes. He has no rales.  Abdominal: Soft. He exhibits no distension. There is no tenderness. There is no rebound and no guarding.  Obese abdomen which is protuberant but not overly distended from prior exams.  His midline wound is closed mostly with staples however there is a loop of what appears to be small bowel extending out of his incision in the infraumbilical area it is not ischemic nor is there any leakage from this to suggest a fistula.  Musculoskeletal: Normal range of motion. He exhibits no edema or tenderness.  Lymphadenopathy:    He has no cervical adenopathy.  Neurological: He is alert and oriented to person, place, and time.  Skin: Skin is warm and dry. No rash noted. He is not diaphoretic. No erythema.  Psychiatric: Mood and affect normal.  Vitals reviewed.       Results for orders placed or performed during the hospital encounter of 12/27/16 (from the past 48 hour(s))  Glucose, capillary     Status: Abnormal   Collection Time: 01/06/17  4:46 AM  Result Value Ref Range   Glucose-Capillary 125 (H) 65 - 99 mg/dL  CBC     Status: Abnormal   Collection Time: 01/06/17  6:06 AM  Result Value Ref Range   WBC 9.8 3.8 - 10.6 K/uL   RBC 2.50 (L) 4.40 - 5.90 MIL/uL   Hemoglobin 7.2 (L) 13.0 - 18.0 g/dL   HCT 21.6 (L) 40.0 - 52.0 %   MCV 86.1 80.0 - 100.0 fL   MCH 28.6 26.0 - 34.0 pg   MCHC 33.2 32.0 - 36.0 g/dL   RDW 16.3 (H) 11.5 - 14.5 %   Platelets 261 150 - 440 K/uL  Glucose, capillary     Status: Abnormal   Collection Time: 01/06/17  7:39 AM  Result Value Ref Range   Glucose-Capillary 124 (H) 65 - 99 mg/dL  Glucose, capillary     Status: Abnormal    Collection Time: 01/06/17 11:37 AM  Result Value Ref Range   Glucose-Capillary 215 (H) 65 - 99 mg/dL   No results found.   Assessment/Plan  Labs are currently pending of note his most recent hemoglobin was 7.2 prior to discharge but he has had no further bleeding rechecking labs is in order at this time.  He has an acute evisceration following a an exploratory laparotomy and requires emergency exploration. He ate dinner at 4:00 last night and has not had anything since. The football game and noticed a pop when he was coughing. He clearly has a postoperative evisceration with small bowel in his wound. He requires emergency exploration and closure. The rationale for this was discussed with he and his wife the options of observation  reviewed and the risks of bleeding infection recurrent evisceration and bowel injury requiring resection were all reviewed with him as was the emergency nature of this procedure.  Patient and wife understood and agreed with this plan.  The patient also has 2 other significant problems one of which is frequent large volume urination without symptoms of a urinary tract infection I will check a urinalysis and labs. I will also has prime doc to see the patient. His second problem is that of blurred vision since surgery last week he had a negative CT scan apparently per the patient and I will research those records and discuss with prime doc any further workup needed.  Florene Glen, MD, FACS

## 2017-01-08 NOTE — Anesthesia Preprocedure Evaluation (Signed)
Anesthesia Evaluation  Patient identified by MRN, date of birth, ID band Patient awake    Reviewed: Allergy & Precautions, NPO status , Patient's Chart, lab work & pertinent test results  History of Anesthesia Complications Negative for: history of anesthetic complications  Airway Mallampati: II  TM Distance: >3 FB Neck ROM: Full    Dental no notable dental hx.    Pulmonary sleep apnea , neg COPD, former smoker,    breath sounds clear to auscultation- rhonchi (-) wheezing      Cardiovascular Exercise Tolerance: Good hypertension, Pt. on medications +CHF (preserved EF)  (-) CAD and (-) Past MI + dysrhythmias (hx of afib s/p ablation) Atrial Fibrillation  - Systolic murmurs and - Diastolic murmurs    Neuro/Psych negative neurological ROS  negative psych ROS   GI/Hepatic negative GI ROS, Neg liver ROS,   Endo/Other  diabetes, Type 2, Oral Hypoglycemic Agents  Renal/GU negative Renal ROS     Musculoskeletal   Abdominal (+) + obese,   Peds  Hematology negative hematology ROS (+) anemia ,   Anesthesia Other Findings   Reproductive/Obstetrics                             Anesthesia Physical  Anesthesia Plan  ASA: III and emergent  Anesthesia Plan: General   Post-op Pain Management:    Induction: Intravenous and Rapid sequence  Airway Management Planned: Oral ETT  Additional Equipment:   Intra-op Plan:   Post-operative Plan: Extubation in OR  Informed Consent: I have reviewed the patients History and Physical, chart, labs and discussed the procedure including the risks, benefits and alternatives for the proposed anesthesia with the patient or authorized representative who has indicated his/her understanding and acceptance.     Plan Discussed with: CRNA and Anesthesiologist  Anesthesia Plan Comments:         Anesthesia Quick Evaluation

## 2017-01-08 NOTE — Op Note (Signed)
   Pre-operative Diagnosis: Wound evisceration  Post-operative Diagnosis: Wound evisceration  Surgeon: Phoebe Perch   Assistants: Surgical tech  Anesthesia: Gen. with endotracheal tube   Procedure Details  The patient was seen again in the Holding Room. The benefits, complications, treatment options, and expected outcomes were discussed with the patient. The risks of bleeding, infection, recurrence of symptoms, failure to resolve symptoms,  bowel injury, any of which could require further surgery were reviewed with the patient.   The patient was taken to Operating Room, identified as Prudence Davidson and the procedure verified.  A Time Out was held and the above information confirmed. The patient is status post recent exploratory laparotomy and colon resection who presents with evisceration following that surgery. He clearly has bowel in his abdominal wound. We discussed risks of bleeding infection recurrence as well as bowel injury requiring resection.  Prior to the induction of general anesthesia, antibiotic prophylaxis was administered. VTE prophylaxis was in place. General endotracheal anesthesia was then administered and tolerated well. After the induction, the abdomen was prepped with Chloraprep and draped in the sterile fashion. The patient was positioned in the supine position. A Foley catheter was placed  Once prepped and draped a time out was held and then the staples were removed carefully. The small bowel loop that was exuding from the wound was constricted between each of the intact staples. These individual staples were removed quite carefully to avoid puncturing the bowel. It was found that each staple had caused a acute constriction of the bowel but other than 2 small serosal injuries there was no full-thickness injury to the bowel. Inspection of the bowel over considerable time. Showed that this was friable and non-ischemic. Additionally there was no sign of full-thickness injury.  However 2 areas of serosal tear and one area of questionable cyst constriction were closed with inverting Lembert type sutures of 3-0 silk. Compromise the lumen of the long loop of bowel that had been eviscerated.  Once inspecting this multiple times and seeing that there was no other sign of injury the cause of the evisceration was identified as a middle suture that had unraveled. This was a. #1 PDS.   Once assuring that the small bowel was not injured and that repairs were compatible. The wound was closed with the placement of #2 nylon sutures over bolsters and running #1 PDS. Once complete closure was performed a quarter-inch Penrose drain was placed into the subcutaneous tissues and held in with staples. Skin staples were placed to loosely approximate skin. A sterile dressing was placed.  Patient tolerated the procedure well there were no complications. Needle count was correct yes May blood loss was nil.  Findings: Small bowel serosal tears with evisceration following unraveling of the middle suture of PDS.   Estimated Blood Loss: Nil         Drains: Penrose drain in the subcutaneous tissues tissues         Specimens: None       Complications: None                  Condition:stable   Azari Janssens E. Burt Knack, MD, FACS

## 2017-01-08 NOTE — Progress Notes (Signed)
POD # 0 AVSS  Labs ok  PE NAD Lungs some ronchi Abd: dressing intact, soft.  Ext: pedal edema  A/p some pulm congestion will try lasix x 1 Keep foley Trend Hb, no transfusion today but we will trend it

## 2017-01-08 NOTE — Anesthesia Postprocedure Evaluation (Signed)
Anesthesia Post Note  Patient: Evan [name]  Procedure(s) Performed: Procedure(s) (LRB): SECONDARY CLOSURE FOR EVISCERATION (N/A)  Patient location during evaluation: PACU Anesthesia Type: General Level of consciousness: awake and alert Pain management: pain level controlled Vital Signs Assessment: post-procedure vital signs reviewed and stable Respiratory status: spontaneous breathing and respiratory function stable Cardiovascular status: stable Anesthetic complications: no     Last Vitals:  Vitals:   01/08/17 0338 01/08/17 0344  BP:  (!) 154/78  Pulse: 90 89  Resp: 19 (!) 21  Temp:  36.2 C    Last Pain:  Vitals:   01/08/17 0344  TempSrc:   PainSc: 4                  KEPHART,Asaiah K

## 2017-01-08 NOTE — Consult Note (Signed)
[facility] HOSPITALIST  Medical Consultation  Evan [name] [id number] DOB: [birth year] DOA: 01/07/2017 PCP: Ezequiel Kayser, MD   Requesting physician: Burt Knack MD Date of consultation: 01/08/2017 Reason for consultation: Medical Management  CHIEF COMPLAINT:   Chief Complaint  Patient presents with  . Post-op Problem    HISTORY OF PRESENT ILLNESS: Evan [name]  is a 72 y.o. male with a known history of Benign prostate hypertrophy, congestive heart failure, diabetes mellitus type 2, hyperlipidemia, hypertension, sleep apnea, sigmoid colon resection for bleeding presented to the emergency room after evisceration of the bowel from the incision site when he coughed up while watching football game at home last night. Patient admitted to surgical service and he went underwent secondary closure for evisceration. Patient tolerated the procedure well. Hospitalist service was consulted for medical management. Has nausea but no vomiting. The cough is improved. Patient has some swelling in both lower extremities. No complaints of any chest pain, shortness of breath. Portable oxygen via nasal cannula. Patient has been started on clear liquid diet.  PAST MEDICAL HISTORY:   Past Medical History:  Diagnosis Date  . Atrial fibrillation ([place])   . BPH (benign prostatic hyperplasia)   . CHF (congestive heart failure) ([place])   . Diabetes mellitus without complication ([place])   . Diverticulosis   . Hyperlipidemia   . Hypertension   . Sleep apnea     PAST SURGICAL HISTORY: Past Surgical History:  Procedure Laterality Date  . ATRIAL ABLATION SURGERY    . CHOLECYSTECTOMY    . LAPAROTOMY  01/01/2017   Procedure: EXPLORATORY LAPAROTOMY;  Surgeon: Olean Ree, MD;  Location: [facility] ORS;  Service: General;;  . PARTIAL COLECTOMY N/A 01/01/2017   Procedure: PARTIAL COLECTOMY;  Surgeon: Olean Ree, MD;  Location: [facility] ORS;  Service: General;  Laterality: N/A;  . PERIPHERAL VASCULAR CATHETERIZATION N/A 12/28/2016    Procedure: Visceral Artery Intervention;  Surgeon: Algernon Huxley, MD;  Location: [place] [facility];  Service: Cardiovascular;  Laterality: N/A;  . TOTAL KNEE ARTHROPLASTY Right     SOCIAL HISTORY:  Social History  Substance Use Topics  . Smoking status: Former Smoker    Packs/day: 0.25    Years: 29.00    Types: Cigarettes    Quit date: 12/31/1993  . Smokeless tobacco: Never Used  . Alcohol use No    FAMILY HISTORY:  Family History  Problem Relation Age of Onset  . Diabetes Sister   . Diabetes Brother   . Diabetes Sister   . Diabetes Brother     DRUG ALLERGIES:  Allergies  Allergen Reactions  . Azithromycin Rash  . Penicillins Rash    Has patient had a PCN reaction causing immediate rash, facial/tongue/throat swelling, SOB or lightheadedness with hypotension: Yes Has patient had a PCN reaction causing severe rash involving mucus membranes or skin necrosis: Yes Has patient had a PCN reaction that required hospitalization No Has patient had a PCN reaction occurring within the last 10 years: No If all of the above answers are "NO", then may proceed with Cephalosporin use.   [name] Lisinopril Cough  . Losartan Cough  . Metoprolol Other (See Comments)    Bradycardia.    REVIEW OF SYSTEMS:   CONSTITUTIONAL: No fever, fatigue or weakness.  EYES: No blurred or double vision.  EARS, NOSE, AND THROAT: No tinnitus or ear pain.  RESPIRATORY: Has cough,no shortness of breath, wheezing or hemoptysis.  CARDIOVASCULAR: No chest pain, orthopnea, edema.  GASTROINTESTINAL: Has nausea,novomiting, diarrhea or abdominal pain.  GENITOURINARY: No dysuria,  hematuria.  ENDOCRINE: No polyuria, nocturia,  HEMATOLOGY: No anemia, easy bruising or bleeding SKIN: No rash or lesion. MUSCULOSKELETAL: No joint pain or arthritis.   Swelling in legs NEUROLOGIC: No tingling, numbness, weakness.  PSYCHIATRY: No anxiety or depression.   MEDICATIONS AT HOME:  Prior to Admission medications   Medication  Sig Start Date End Date Taking? Authorizing Provider  acetaminophen (TYLENOL) 500 MG tablet Take 1,000 mg by mouth every 6 (six) hours as needed.   Yes Historical Provider, MD  amLODipine (NORVASC) 5 MG tablet Take 5 mg by mouth daily. 05/03/16  Yes Historical Provider, MD  Cholecalciferol (VITAMIN D3) 2000 units capsule Take 2,000 Units by mouth daily.   Yes Historical Provider, MD  docusate calcium (SURFAK) 240 MG capsule Take 1 capsule by mouth daily.   Yes Historical Provider, MD  dorzolamide-timolol (COSOPT) 22.3-6.8 MG/ML ophthalmic solution Apply 1 drop to eye 2 (two) times daily.   Yes Historical Provider, MD  feeding supplement (BOOST / RESOURCE BREEZE) LIQD Take 1 Container by mouth 3 (three) times daily between meals. 01/06/17  Yes Theodoro Grist, MD  ferrous gluconate (FERGON) 324 MG tablet Take 1 tablet (324 mg total) by mouth 2 (two) times daily with a meal. 01/06/17  Yes Theodoro Grist, MD  latanoprost (XALATAN) 0.005 % ophthalmic solution Apply 1 drop to eye at bedtime.   Yes Historical Provider, MD  metFORMIN (GLUCOPHAGE) 1000 MG tablet Take 1,000 mg by mouth daily with breakfast.  05/14/16 05/14/17 Yes Historical Provider, MD  Multiple Vitamins-Minerals (MULTIVITAMIN ADULT PO) Take 1 tablet by mouth daily.   Yes Historical Provider, MD  Omega-3 Fatty Acids (FISH OIL) 1200 MG CAPS Take 2 capsules by mouth daily.   Yes Historical Provider, MD  pravastatin (PRAVACHOL) 40 MG tablet Take 40 mg by mouth daily. 05/03/16  Yes Historical Provider, MD      PHYSICAL EXAMINATION:   VITAL SIGNS: Blood pressure 138/73, pulse 91, temperature 98.8 F (37.1 C), temperature source Oral, resp. rate 18, height 6\' 3"  (1.905 m), weight (!) 137 kg (302 lb), SpO2 96 %.  GENERAL:  72 y.o.-year-old patient lying in the bed with no acute distress.  EYES: Pupils equal, round, reactive to light and accommodation. No scleral icterus. Extraocular muscles intact.  HEENT: Head atraumatic, normocephalic. Oropharynx and  nasopharynx clear.  NECK:  Supple, no jugular venous distention. No thyroid enlargement, no tenderness.  LUNGS: Normal breath sounds bilaterally, no wheezing, rales,rhonchi or crepitation. No use of accessory muscles of respiration.  CARDIOVASCULAR: S1, S2 normal. No murmurs, rubs, or gallops.  ABDOMEN: Soft, Abdominal bandage noted, nondistended. Bowel sounds present. No organomegaly or mass.  EXTREMITIES: Has pedal edema, no cyanosis, or clubbing.  NEUROLOGIC: Cranial nerves II through XII are intact. Muscle strength 5/5 in all extremities. Sensation intact. Gait not checked.  PSYCHIATRIC: The patient is alert and oriented x 3.  SKIN: No obvious rash, lesion, or ulcer.   LABORATORY PANEL:   CBC  Recent Labs Lab 01/02/17 0438 01/03/17 0441  01/04/17 0429 01/05/17 0527 01/06/17 0606 01/08/17 0102 01/08/17 0503  WBC 18.0* 17.5*  --  15.8* 12.1* 9.8 14.0* 14.8*  HGB 8.2* 6.2*  < > 7.8* 7.0* 7.2* 7.7* 7.9*  HCT 24.4* 18.9*  < > 23.1* 21.3* 21.6* 24.0* 25.0*  PLT 139* 160  --  192 219 261 397 360  MCV 86.0 87.7  --  86.3 86.4 86.1 85.5 85.6  MCH 28.8 28.7  --  29.0 28.3 28.6 27.4 27.2  MCHC 33.4 32.7  --  33.6 32.8 33.2 32.0 31.8*  RDW 15.1* 15.1*  --  16.5* 15.9* 16.3* 16.9* 16.9*  LYMPHSABS 1.6 3.0  --  1.6  --   --  1.4  --   MONOABS 1.8* 0.4  --  0.8  --   --  0.8  --   EOSABS 0.4 0.4  --  0.0  --   --  0.3  --   BASOSABS 0.0 0.0  --  0.0  --   --  0.0  --   < > = values in this interval not displayed. ------------------------------------------------------------------------------------------------------------------  Chemistries   Recent Labs Lab 01/01/17 0555  01/03/17 0441 01/04/17 0429 01/05/17 0527 01/08/17 0102 01/08/17 0503  NA 138  < > 140 139 138 138 140  K 3.1*  < > 3.5 3.8 3.2* 3.7 3.7  CL 108  < > 108 108 105 103 104  CO2 27  < > 27 27 28 28 31   GLUCOSE 134*  < > 128* 105* 124* 199* 161*  BUN 6  < > 8 8 7  5* <5*  CREATININE 0.73  < > 0.77 0.65 0.55*  0.72 0.75  CALCIUM 8.0*  < > 7.7* 7.8* 8.0* 9.1 8.8*  MG 1.7  --   --  1.8  --   --   --   AST  --   --   --   --   --  26 21  ALT  --   --   --   --   --  17 18  ALKPHOS  --   --   --   --   --  55 59  BILITOT  --   --   --   --   --  0.7 0.8  < > = values in this interval not displayed. ------------------------------------------------------------------------------------------------------------------ estimated creatinine clearance is 124.5 mL/min (by C-G formula based on SCr of 0.75 mg/dL). ------------------------------------------------------------------------------------------------------------------ No results for input(s): TSH, T4TOTAL, T3FREE, THYROIDAB in the last 72 hours.  Invalid input(s): FREET3   Coagulation profile  Recent Labs Lab 01/01/17 0555 01/08/17 0102  INR 1.18 1.11   ------------------------------------------------------------------------------------------------------------------- No results for input(s): DDIMER in the last 72 hours. -------------------------------------------------------------------------------------------------------------------  Cardiac Enzymes No results for input(s): CKMB, TROPONINI, MYOGLOBIN in the last 168 hours.  Invalid input(s): CK ------------------------------------------------------------------------------------------------------------------ Invalid input(s): POCBNP  ---------------------------------------------------------------------------------------------------------------  Urinalysis    Component Value Date/Time   COLORURINE Yellow 03/10/2014 0744   APPEARANCEUR Clear 03/10/2014 0744   LABSPEC 1.008 03/10/2014 0744   PHURINE 6.0 03/10/2014 0744   GLUCOSEU Negative 03/10/2014 0744   HGBUR Negative 03/10/2014 0744   BILIRUBINUR Negative 03/10/2014 0744   KETONESUR Negative 03/10/2014 0744   PROTEINUR Negative 03/10/2014 0744   NITRITE Negative 03/10/2014 0744   LEUKOCYTESUR Trace 03/10/2014 0744      RADIOLOGY: No results found.  EKG: Orders placed or performed during the hospital encounter of 12/27/16  . ED EKG  . ED EKG  . EKG 12-Lead  . EKG 12-Lead    IMPRESSION AND PLAN: 72 year old male patient with history of congestive heart failure, type 2 diabetes for this, hypertension, hyperlipidemia, atrial fib vision, prostate hypertrophy presented to the emergency room for evisceration of the bowel from the incision site. Assessment 1. Status post secondary closure for evisceration of the bowel from the incision site 2. Congestive heart failure 3. Type 2 diabetes mellitus 4. Hypertension 5. Hyperlipidemia 6. History of sigmoid colectomy Treatment plan Patient started on clear liquid diet Continue oxygen via nasal  cannula Input output chart Resume home medications IV fluids hydration cautiously 2 hour fluid overload Check urine analysis for infection Supportive care.  All the records are reviewed and case discussed with ED provider. Management plans discussed with the patient, family and they are in agreement.  CODE STATUS:FULL CODE    Code Status Orders        Start     Ordered   01/08/17 0305  Full code  Continuous     01/08/17 0314    Code Status History    Date Active Date Inactive Code Status Order ID Comments User Context   12/27/2016 11:54 AM 01/06/2017  5:26 PM Full Code [id number]  Hillary Bow, MD ED    Advance Directive Documentation   Flowsheet Row Most Recent Value  Type of Advance Directive  Living will  Pre-existing out of facility DNR order (yellow form or pink MOST form)  No data  "MOST" Form in Place?  No data       TOTAL TIME TAKING CARE OF THIS PATIENT: 52 minutes.    Saundra Shelling M.D on 01/08/2017 at 5:43 AM  Between 7am to 6pm - Pager - [phone]  After 6pm go to [email or web address] - password [email or web address] [facility]  Office  [phone]  CC: Primary care physician; Ezequiel Kayser, MD

## 2017-01-09 ENCOUNTER — Inpatient Hospital Stay: Payer: Medicare Other

## 2017-01-09 LAB — COMPREHENSIVE METABOLIC PANEL
ALK PHOS: 51 U/L (ref 38–126)
ALT: 15 U/L — ABNORMAL LOW (ref 17–63)
ANION GAP: 5 (ref 5–15)
AST: 14 U/L — ABNORMAL LOW (ref 15–41)
Albumin: 2.3 g/dL — ABNORMAL LOW (ref 3.5–5.0)
BILIRUBIN TOTAL: 1 mg/dL (ref 0.3–1.2)
BUN: 7 mg/dL (ref 6–20)
CALCIUM: 8.5 mg/dL — AB (ref 8.9–10.3)
CO2: 32 mmol/L (ref 22–32)
Chloride: 101 mmol/L (ref 101–111)
Creatinine, Ser: 0.73 mg/dL (ref 0.61–1.24)
GLUCOSE: 171 mg/dL — AB (ref 65–99)
Potassium: 4.1 mmol/L (ref 3.5–5.1)
Sodium: 138 mmol/L (ref 135–145)
TOTAL PROTEIN: 5.1 g/dL — AB (ref 6.5–8.1)

## 2017-01-09 LAB — GLUCOSE, CAPILLARY
GLUCOSE-CAPILLARY: 173 mg/dL — AB (ref 65–99)
GLUCOSE-CAPILLARY: 188 mg/dL — AB (ref 65–99)
Glucose-Capillary: 159 mg/dL — ABNORMAL HIGH (ref 65–99)
Glucose-Capillary: 152 mg/dL — ABNORMAL HIGH (ref 65–99)

## 2017-01-09 LAB — CBC
HCT: 23.7 % — ABNORMAL LOW (ref 40.0–52.0)
HEMOGLOBIN: 7.6 g/dL — AB (ref 13.0–18.0)
MCH: 27.5 pg (ref 26.0–34.0)
MCHC: 32.1 g/dL (ref 32.0–36.0)
MCV: 85.8 fL (ref 80.0–100.0)
Platelets: 376 10*3/uL (ref 150–440)
RBC: 2.76 MIL/uL — AB (ref 4.40–5.90)
RDW: 17.1 % — ABNORMAL HIGH (ref 11.5–14.5)
WBC: 18.3 10*3/uL — ABNORMAL HIGH (ref 3.8–10.6)

## 2017-01-09 MED ORDER — ACETAMINOPHEN 500 MG PO TABS
1000.0000 mg | ORAL_TABLET | Freq: Four times a day (QID) | ORAL | Status: AC
  Administered 2017-01-09 – 2017-01-13 (×13): 1000 mg via ORAL
  Filled 2017-01-09 (×9): qty 2
  Filled 2017-01-09: qty 1
  Filled 2017-01-09 (×4): qty 2

## 2017-01-09 MED ORDER — FUROSEMIDE 10 MG/ML IJ SOLN
40.0000 mg | Freq: Once | INTRAMUSCULAR | Status: AC
Start: 2017-01-09 — End: 2017-01-09
  Administered 2017-01-09: 40 mg via INTRAVENOUS
  Filled 2017-01-09: qty 4

## 2017-01-09 MED ORDER — HYDROMORPHONE HCL 1 MG/ML IJ SOLN: 0.5000 mg | INTRAMUSCULAR | Status: DC | PRN

## 2017-01-09 MED ORDER — IPRATROPIUM-ALBUTEROL 0.5-2.5 (3) MG/3ML IN SOLN
3.0000 mL | Freq: Four times a day (QID) | RESPIRATORY_TRACT | Status: DC
  Administered 2017-01-10 – 2017-01-13 (×12): 3 mL via RESPIRATORY_TRACT
  Filled 2017-01-09 (×12): qty 3

## 2017-01-09 MED ORDER — OXYCODONE HCL 5 MG PO TABS
10.0000 mg | ORAL_TABLET | ORAL | Status: DC | PRN
  Filled 2017-01-09: qty 2

## 2017-01-09 MED ORDER — IPRATROPIUM-ALBUTEROL 0.5-2.5 (3) MG/3ML IN SOLN: 3.0000 mL | RESPIRATORY_TRACT | Status: DC

## 2017-01-09 NOTE — Evaluation (Signed)
Physical Therapy Evaluation Patient Details Name: Evan [name] MRN: [id number] DOB: 02-15-1945 Today's Date: 01/09/2017   History of Present Illness  This a patient who is status post exploratory laparotomy and sigmoid colon resection for bleeding last week. His had no further bleeding and was at home when he coughed and noticed a pop with signs of bowel coming out of his incision.  Clinical Impression  Pt presents to PT with generalized weakness and difficulty walking and would benefit from acute PT services to address objective findings.  Pt home for 1 day in between hospitalizations due to complications with incision, requiring surgical repair.  Pt coping well with pain using PCA and motivated to continue with ambulation and strengthening.     Follow Up Recommendations Home health PT    Equipment Recommendations  None recommended by PT    Recommendations for Other Services       Precautions / Restrictions Precautions Precautions: Other (comment) Precaution Comments: abdominal binder, log roll Restrictions Weight Bearing Restrictions: No      Mobility  Bed Mobility Overal bed mobility: Modified Independent (per pt report, pt up in recliner)                Transfers Overall transfer level: Needs assistance Equipment used: Rolling walker (2 wheeled) Transfers: Sit to/from Stand Sit to Stand: Supervision         General transfer comment: using B UE's on chair for lift off with minimal forward lean  Ambulation/Gait Ambulation/Gait assistance: Supervision Ambulation Distance (Feet): 300 Feet Assistive device: Rolling walker (2 wheeled) Gait Pattern/deviations: WFL(Within Functional Limits);Step-through pattern     General Gait Details: Steady reciprocal gait with reduced cadence due to abdominal soreness.  Stairs            Wheelchair Mobility    Modified Rankin (Stroke Patients Only)       Balance Overall balance assessment: Modified  Independent Sitting-balance support: Feet supported Sitting balance-Leahy Scale: Normal     Standing balance support: Bilateral upper extremity supported Standing balance-Leahy Scale: Good                               Pertinent Vitals/Pain Pain Assessment: No/denies pain Pain Score:  (not rated) Pain Location: incision site Pain Descriptors / Indicators: Sore    Home Living Family/patient expects to be discharged to:: Private residence Living Arrangements: Spouse/significant other Available Help at Discharge: Family;Available 24 hours/day Type of Home: House Home Access: Stairs to enter Entrance Stairs-Rails: None Entrance Stairs-Number of Steps: 1 Home Layout: One level Home Equipment: Walker - 2 wheels;Cane - single point      Prior Function Level of Independence: Independent         Comments: went to gym recently until GI bleed started (prior to initial surgery)     Hand Dominance        Extremity/Trunk Assessment   Upper Extremity Assessment Upper Extremity Assessment: Overall WFL for tasks assessed    Lower Extremity Assessment Lower Extremity Assessment: Generalized weakness       Communication   Communication: No difficulties  Cognition Arousal/Alertness: Awake/alert Behavior During Therapy: WFL for tasks assessed/performed Overall Cognitive Status: Within Functional Limits for tasks assessed                      General Comments General comments (skin integrity, edema, etc.): abdominal binder intact    Exercises Other Exercises Other Exercises: Seated LAQ, ABB/ADD,  AP   Assessment/Plan    PT Assessment Patient needs continued PT services  PT Problem List Decreased strength;Decreased activity tolerance;Decreased mobility;Cardiopulmonary status limiting activity;Pain          PT Treatment Interventions DME instruction;Stair training;Gait training;Functional mobility training;Therapeutic activities;Therapeutic  exercise;Balance training;Patient/family education    PT Goals (Current goals can be found in the Care Plan section)  Acute Rehab PT Goals Patient Stated Goal: To go home and get better. PT Goal Formulation: With patient/family Time For Goal Achievement: 01/16/17 Potential to Achieve Goals: Good    Frequency Min 2X/week   Barriers to discharge        Co-evaluation               End of Session Equipment Utilized During Treatment: Oxygen Activity Tolerance: Patient tolerated treatment well Patient left: in chair;with call bell/phone within reach Nurse Communication: Mobility status         Time: [id number] PT Time Calculation (min) (ACUTE ONLY): 25 min   Charges:   PT Evaluation $PT Eval Low Complexity: 1 Procedure PT Treatments $Therapeutic Exercise: 8-22 mins   PT G Codes:        Jill Ruppe A Hazelynn Mckenny, PT 01/09/2017, 1:31 PM

## 2017-01-09 NOTE — Progress Notes (Signed)
Pharmacy Antibiotic Note  Evan [name] is a 72 y.o. male admitted on 01/07/2017 with intra-abdominal infection and bowel evisceration s/p operation 01/07/17.  Pt received dose of ciprofloxacin 400 mg IV for surgical prophylaxis. Pharmacy has been consulted for meropenem dosing. Pt has allergy (rash) to penicillins. Although WBC increasing and pt had Tmax of 101 yesterday, Dr. Benjie [name] says clinically the patient looks good.   Plan: Continue meropenem 1 g q8 hours. Will continue to follow Cx  Height: 6\' 3"  (190.5 cm) Weight: (!) 302 lb (137 kg) IBW/kg (Calculated) : 84.5  Temp (24hrs), Avg:99.8 F (37.7 C), Min:98.9 F (37.2 C), Max:101.2 F (38.4 C)   Recent Labs Lab 01/04/17 0429 01/05/17 0527 01/06/17 0606 01/08/17 0102 01/08/17 0503 01/09/17 0621  WBC 15.8* 12.1* 9.8 14.0* 14.8* 18.3*  CREATININE 0.65 0.55*  --  0.72 0.75 0.73    Estimated Creatinine Clearance: 124.5 mL/min (by C-G formula based on SCr of 0.73 mg/dL).    Allergies  Allergen Reactions  . Azithromycin Rash  . Penicillins Rash    Has patient had a PCN reaction causing immediate rash, facial/tongue/throat swelling, SOB or lightheadedness with hypotension: Yes Has patient had a PCN reaction causing severe rash involving mucus membranes or skin necrosis: Yes Has patient had a PCN reaction that required hospitalization No Has patient had a PCN reaction occurring within the last 10 years: No If all of the above answers are "NO", then may proceed with Cephalosporin use.   [name] Lisinopril Cough  . Losartan Cough  . Metoprolol Other (See Comments)    Bradycardia.    Antimicrobials this admission: cipro x 1 Meropenem 1/9 >>   Dose adjustments this admission:  Microbiology results: 1/9 BCx: NG < 24 hours  UCx:   1/9 Sputum: sent   MRSA PCR  Thank you for allowing pharmacy to be a part of this patient's care.  Evan [name], PharmD Pharmacy Resident 01/09/2017 12:44 PM

## 2017-01-09 NOTE — Progress Notes (Signed)
Foley d/c at 630am per order

## 2017-01-09 NOTE — Progress Notes (Signed)
[facility] [place] NAME: Evan [name]    MR#:  [id number]  DATE OF BIRTH:  14-Jan-1945  SUBJECTIVE:   Doing ok this am Sitting up in chair. No bowel movement or passing gas No abdominal pain Lower extremity edema has improved  REVIEW OF SYSTEMS:    Review of Systems  Constitutional: Negative.  Negative for chills, fever and malaise/fatigue.  HENT: Negative.  Negative for ear discharge, ear pain, hearing loss, nosebleeds and sore throat.   Eyes: Negative.  Negative for blurred vision and pain.  Respiratory: Negative.  Negative for cough, hemoptysis, shortness of breath and wheezing.   Cardiovascular: Negative.  Negative for chest pain, palpitations and leg swelling.  Gastrointestinal: Negative.  Negative for abdominal pain, blood in stool, diarrhea, nausea and vomiting.  Genitourinary: Negative.  Negative for dysuria.  Musculoskeletal: Negative.  Negative for back pain.  Skin: Negative.   Neurological: Negative for dizziness, tremors, speech change, focal weakness, seizures and headaches.  Endo/Heme/Allergies: Negative.  Does not bruise/bleed easily.  Psychiatric/Behavioral: Negative.  Negative for depression, hallucinations and suicidal ideas.    Tolerating Diet:yes      DRUG ALLERGIES:   Allergies  Allergen Reactions  . Azithromycin Rash  . Penicillins Rash    Has patient had a PCN reaction causing immediate rash, facial/tongue/throat swelling, SOB or lightheadedness with hypotension: Yes Has patient had a PCN reaction causing severe rash involving mucus membranes or skin necrosis: Yes Has patient had a PCN reaction that required hospitalization No Has patient had a PCN reaction occurring within the last 10 years: No If all of the above answers are "NO", then may proceed with Cephalosporin use.   [name] Lisinopril Cough  . Losartan Cough  . Metoprolol Other (See Comments)    Bradycardia.    VITALS:  Blood pressure 112/70,  pulse (!) 108, temperature 98.9 F (37.2 C), temperature source Oral, resp. rate (!) 24, height 6\' 3"  (1.905 m), weight (!) 137 kg (302 lb), SpO2 97 %.  PHYSICAL EXAMINATION:   Physical Exam  Constitutional: He is oriented to person, place, and time and well-developed, well-nourished, and in no distress. No distress.  HENT:  Head: Normocephalic.  Eyes: No scleral icterus.  Neck: Normal range of motion. Neck supple. No JVD present. No tracheal deviation present.  Cardiovascular: Normal rate, regular rhythm and normal heart sounds.  Exam reveals no gallop and no friction rub.   No murmur heard. Pulmonary/Chest: Effort normal. No respiratory distress. He has wheezes. He has no rales. He exhibits no tenderness.  Abdominal: Soft. Bowel sounds are normal. He exhibits distension. He exhibits no mass. There is tenderness. There is no rebound and no guarding.  Musculoskeletal: Normal range of motion. He exhibits no edema.  Neurological: He is alert and oriented to person, place, and time.  Skin: Skin is warm. No rash noted. No erythema.  Psychiatric: Affect and judgment normal.      LABORATORY PANEL:   CBC  Recent Labs Lab 01/09/17 0621  WBC 18.3*  HGB 7.6*  HCT 23.7*  PLT 376   ------------------------------------------------------------------------------------------------------------------  Chemistries   Recent Labs Lab 01/04/17 0429  01/09/17 0621  NA 139  < > 138  K 3.8  < > 4.1  CL 108  < > 101  CO2 27  < > 32  GLUCOSE 105*  < > 171*  BUN 8  < > 7  CREATININE 0.65  < > 0.73  CALCIUM 7.8*  < > 8.5*  MG 1.8  --   --   AST  --   < > 14*  ALT  --   < > 15*  ALKPHOS  --   < > 51  BILITOT  --   < > 1.0  < > = values in this interval not displayed. ------------------------------------------------------------------------------------------------------------------  Cardiac Enzymes No results for input(s): TROPONINI in the last 168  hours. ------------------------------------------------------------------------------------------------------------------  RADIOLOGY:  US Venous Img Lower Bilateral  Result Date: 01/09/2017 CLINICAL DATA:  Swelling x7 days EXAM: BILATERAL LOWER EXTREMITY VENOUS DOPPLER ULTRASOUND TECHNIQUE: Gray-scale sonography with compression, as well as color and duplex ultrasound, were performed to evaluate the deep venous system from the level of the common femoral vein through the popliteal and proximal calf veins. COMPARISON:  None FINDINGS: On the right, Normal compressibility of the common femoral, superficial femoral, and popliteal veins, as well as the proximal calf veins. No filling defects to suggest DVT on grayscale or color Doppler imaging. Doppler waveforms show normal direction of venous flow, normal respiratory phasicity and response to augmentation. There is thrombosis of a gastrocnemius vein with noncompressibility and no color Doppler signal. 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 One moiety is noncompressible with no color Doppler signal. Normal compressibility of common femoral, deep femoral, and popliteal veins as well was visualized calf veins. There is decreased response to augmentation in the popliteal vein. IMPRESSION: 1. 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 2. Isolated right gastrocnemius vein (calf) DVT. Electronically Signed   By: Lucrezia Europe M.D.   On: 01/09/2017 10:32     ASSESSMENT AND PLAN:    72 year old male status post exploratory laparotomy and sigmoid colon resection last week presented with acute evisceration.   1. Hypoxic respiratory failure, acute: Chest x-ray to evaluate for pulmonary edema One-time dose of Lasix DuoNeb's Wean O2 as tolerated  2. Polyuria: resolved UA shows no UTI  3. acute evisceration: Management as per surgery Continue meropenem  4. Essential hypertension on Norvasc  5. Diabetes:  Continue sliding scale insulin  Management plans discussed with the patient and he is in agreement.  CODE STATUS: full  TOTAL TIME TAKING CARE OF THIS PATIENT: 26 minutes.     POSSIBLE D/C 2 days, DEPENDING ON CLINICAL CONDITION.   Evan [name] M.D on 01/09/2017 at 10:45 AM  Between 7am to 6pm - Pager - [phone] After 6pm go to [email or web address] - password [email or web address] [facility]  Office  [phone]  CC: Primary care physician; Ezequiel Kayser, MD  Note: This dictation was prepared with Dragon dictation along with smaller phrase technology. Any transcriptional errors that result from this process are unintentional.

## 2017-01-09 NOTE — Plan of Care (Signed)
Problem: Physical Regulation: Goal: Will remain free from infection Outcome: Progressing Educated patient on the need for hand hygiene to prevent infection

## 2017-01-09 NOTE — Progress Notes (Signed)
POd # 2 Spike 101.2 temp yesterday He is tolerating Clears, no flatus, no emesis No abdominal pain VSS  PE NAD Chest :wheezes NSR Abd: dressing intact. Mild incisional tenderness no peritonitis Ext; significant improvement of edema after lasix  A/p Doing well F/u temp no evidence of immediate surgical complications likely inflammatory response from resent surgery and atelectasis. Mobilize Follow anemia Only clears for now

## 2017-01-10 DIAGNOSIS — E119 Type 2 diabetes mellitus without complications: Secondary | ICD-10-CM

## 2017-01-10 DIAGNOSIS — R600 Localized edema: Secondary | ICD-10-CM

## 2017-01-10 DIAGNOSIS — I10 Essential (primary) hypertension: Secondary | ICD-10-CM

## 2017-01-10 DIAGNOSIS — I82409 Acute embolism and thrombosis of unspecified deep veins of unspecified lower extremity: Secondary | ICD-10-CM

## 2017-01-10 LAB — BASIC METABOLIC PANEL
Anion gap: 5 (ref 5–15)
Anion gap: 5 (ref 5–15)
BUN: 7 mg/dL (ref 6–20)
BUN: 6 mg/dL (ref 6–20)
CHLORIDE: 99 mmol/L — AB (ref 101–111)
CHLORIDE: 103 mmol/L (ref 101–111)
CO2: 33 mmol/L — ABNORMAL HIGH (ref 22–32)
CO2: 29 mmol/L (ref 22–32)
CREATININE: 0.65 mg/dL (ref 0.61–1.24)
Calcium: 8.4 mg/dL — ABNORMAL LOW (ref 8.9–10.3)
Calcium: 8.6 mg/dL — ABNORMAL LOW (ref 8.9–10.3)
Creatinine, Ser: 0.61 mg/dL (ref 0.61–1.24)
GFR calc Af Amer: >60 mL/min (ref >60)
GFR calc non Af Amer: >60 mL/min (ref >60)
GFR calc non Af Amer: >60 mL/min (ref >60)
GLUCOSE: 142 mg/dL — AB (ref 65–99)
Glucose, Bld: 149 mg/dL — ABNORMAL HIGH (ref 65–99)
POTASSIUM: 3.6 mmol/L (ref 3.5–5.1)
Potassium: 3.8 mmol/L (ref 3.5–5.1)
SODIUM: 137 mmol/L (ref 135–145)
SODIUM: 137 mmol/L (ref 135–145)

## 2017-01-10 LAB — CBC
HCT: 22.3 % — ABNORMAL LOW (ref 40.0–52.0)
HEMATOCRIT: 26.8 % — AB (ref 40.0–52.0)
Hemoglobin: 7.2 g/dL — ABNORMAL LOW (ref 13.0–18.0)
Hemoglobin: 8.5 g/dL — ABNORMAL LOW (ref 13.0–18.0)
MCH: 27.1 pg (ref 26.0–34.0)
MCH: 27.2 pg (ref 26.0–34.0)
MCHC: 32.2 g/dL (ref 32.0–36.0)
MCHC: 31.8 g/dL — ABNORMAL LOW (ref 32.0–36.0)
MCV: 84.1 fL (ref 80.0–100.0)
MCV: 85.4 fL (ref 80.0–100.0)
PLATELETS: 393 10*3/uL (ref 150–440)
Platelets: 408 10*3/uL (ref 150–440)
RBC: 2.66 MIL/uL — AB (ref 4.40–5.90)
RBC: 3.14 MIL/uL — ABNORMAL LOW (ref 4.40–5.90)
RDW: 16.8 % — ABNORMAL HIGH (ref 11.5–14.5)
RDW: 16.5 % — AB (ref 11.5–14.5)
WBC: 13.2 10*3/uL — AB (ref 3.8–10.6)
WBC: 13.8 10*3/uL — AB (ref 3.8–10.6)

## 2017-01-10 LAB — GLUCOSE, CAPILLARY
GLUCOSE-CAPILLARY: 183 mg/dL — AB (ref 65–99)
Glucose-Capillary: 151 mg/dL — ABNORMAL HIGH (ref 65–99)
Glucose-Capillary: 147 mg/dL — ABNORMAL HIGH (ref 65–99)
Glucose-Capillary: 149 mg/dL — ABNORMAL HIGH (ref 65–99)

## 2017-01-10 LAB — PREPARE RBC (CROSSMATCH)

## 2017-01-10 MED ORDER — ACETAZOLAMIDE SODIUM 500 MG IJ SOLR
500.0000 mg | Freq: Four times a day (QID) | INTRAMUSCULAR | Status: AC
  Administered 2017-01-10 (×2): 500 mg via INTRAVENOUS
  Filled 2017-01-10 (×2): qty 500

## 2017-01-10 MED ORDER — SODIUM CHLORIDE 0.9 % IV SOLN
Freq: Once | INTRAVENOUS | Status: AC
  Administered 2017-01-10: 13:00:00 via INTRAVENOUS

## 2017-01-10 MED ORDER — GUAIFENESIN ER 600 MG PO TB12
600.0000 mg | ORAL_TABLET | Freq: Two times a day (BID) | ORAL | Status: DC
  Administered 2017-01-10 – 2017-01-11 (×3): 600 mg via ORAL
  Filled 2017-01-10 (×3): qty 1

## 2017-01-10 MED ORDER — DEXTROSE IN LACTATED RINGERS 5 % IV SOLN
INTRAVENOUS | Status: DC
  Administered 2017-01-10: 15:00:00 via INTRAVENOUS

## 2017-01-10 NOTE — Progress Notes (Signed)
[facility] [place] NAME: Evan [name]    MR#:  [id number]  DATE OF BIRTH:  01-24-1945  SUBJECTIVE:  Passing flatulus this am Has had non productive cough for past few weeks CXR yesterday was negative for CHF/PNA   REVIEW OF SYSTEMS:    Review of Systems  Constitutional: Negative.  Negative for chills, fever and malaise/fatigue.  HENT: Negative.  Negative for ear discharge, ear pain, hearing loss, nosebleeds and sore throat.   Eyes: Negative.  Negative for blurred vision and pain.  Respiratory: Positive for cough. Negative for hemoptysis, shortness of breath and wheezing.   Cardiovascular: Negative.  Negative for chest pain, palpitations and leg swelling.  Gastrointestinal: Negative.  Negative for abdominal pain, blood in stool, diarrhea, nausea and vomiting.  Genitourinary: Negative.  Negative for dysuria.  Musculoskeletal: Negative.  Negative for back pain.  Skin: Negative.   Neurological: Negative for dizziness, tremors, speech change, focal weakness, seizures and headaches.  Endo/Heme/Allergies: Negative.  Does not bruise/bleed easily.  Psychiatric/Behavioral: Negative.  Negative for depression, hallucinations and suicidal ideas.    Tolerating Diet:yes      DRUG ALLERGIES:   Allergies  Allergen Reactions  . Azithromycin Rash  . Penicillins Rash    Has patient had a PCN reaction causing immediate rash, facial/tongue/throat swelling, SOB or lightheadedness with hypotension: Yes Has patient had a PCN reaction causing severe rash involving mucus membranes or skin necrosis: Yes Has patient had a PCN reaction that required hospitalization No Has patient had a PCN reaction occurring within the last 10 years: No If all of the above answers are "NO", then may proceed with Cephalosporin use.   [name] Lisinopril Cough  . Losartan Cough  . Metoprolol Other (See Comments)    Bradycardia.    VITALS:  Blood pressure 117/62, pulse 83,  temperature 98.2 F (36.8 C), temperature source Oral, resp. rate 17, height 6\' 3"  (1.905 m), weight (!) 137 kg (302 lb), SpO2 100 %.  PHYSICAL EXAMINATION:   Physical Exam  Constitutional: He is oriented to person, place, and time and well-developed, well-nourished, and in no distress. No distress.  HENT:  Head: Normocephalic.  Eyes: No scleral icterus.  Neck: Normal range of motion. Neck supple. No JVD present. No tracheal deviation present.  Cardiovascular: Normal rate, regular rhythm and normal heart sounds.  Exam reveals no gallop and no friction rub.   No murmur heard. Pulmonary/Chest: Effort normal. No respiratory distress. He has wheezes. He has no rales. He exhibits no tenderness.  Abdominal: Soft. Bowel sounds are normal. He exhibits distension. He exhibits no mass. There is no rebound and no guarding.  Bandages on Penrose drain in place  Musculoskeletal: Normal range of motion. He exhibits no edema.  Neurological: He is alert and oriented to person, place, and time.  Skin: Skin is warm. No rash noted. No erythema.  Psychiatric: Affect and judgment normal.      LABORATORY PANEL:   CBC  Recent Labs Lab 01/10/17 0450  WBC 13.2*  HGB 7.2*  HCT 22.3*  PLT 393   ------------------------------------------------------------------------------------------------------------------  Chemistries   Recent Labs Lab 01/04/17 0429  01/09/17 0621 01/10/17 0450  NA 139  < > 138 137  K 3.8  < > 4.1 3.8  CL 108  < > 101 99*  CO2 27  < > 32 33*  GLUCOSE 105*  < > 171* 149*  BUN 8  < > 7 7  CREATININE 0.65  < > 0.73  0.65  CALCIUM 7.8*  < > 8.5* 8.4*  MG 1.8  --   --   --   AST  --   < > 14*  --   ALT  --   < > 15*  --   ALKPHOS  --   < > 51  --   BILITOT  --   < > 1.0  --   < > = values in this interval not displayed. ------------------------------------------------------------------------------------------------------------------  Cardiac Enzymes No results for  input(s): TROPONINI in the last 168 hours. ------------------------------------------------------------------------------------------------------------------  RADIOLOGY:  Dg Chest 1 View  Result Date: 01/09/2017 CLINICAL DATA:  Wheezing EXAM: CHEST 1 VIEW COMPARISON:  01/04/2017 FINDINGS: Cardiomediastinal silhouette is stable. No infiltrate or pleural effusion. No pulmonary edema. Degenerative changes mid and lower thoracic spine. IMPRESSION: No active disease. Electronically Signed   By: Lahoma Crocker M.D.   On: 01/09/2017 12:17   US Venous Img Lower Bilateral  Result Date: 01/09/2017 CLINICAL DATA:  Swelling x7 days EXAM: BILATERAL LOWER EXTREMITY VENOUS DOPPLER ULTRASOUND TECHNIQUE: Gray-scale sonography with compression, as well as color and duplex ultrasound, were performed to evaluate the deep venous system from the level of the common femoral vein through the popliteal and proximal calf veins. COMPARISON:  None FINDINGS: On the right, Normal compressibility of the common femoral, superficial femoral, and popliteal veins, as well as the proximal calf veins. No filling defects to suggest DVT on grayscale or color Doppler imaging. Doppler waveforms show normal direction of venous flow, normal respiratory phasicity and response to augmentation. There is thrombosis of a gastrocnemius vein with noncompressibility and no color Doppler signal. 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 One moiety is noncompressible with no color Doppler signal. Normal compressibility of common femoral, deep femoral, and popliteal veins as well was visualized calf veins. There is decreased response to augmentation in the popliteal vein. IMPRESSION: 1. 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 2. Isolated right gastrocnemius vein (calf) DVT. Electronically Signed   By: Lucrezia Europe M.D.   On: 01/09/2017 10:32     ASSESSMENT AND PLAN:    72 year old male status post  exploratory laparotomy and sigmoid colon resection last week presented with acute evisceration.   1. Hypoxic respiratory failure, acute: CXR is negative for CHF or PNA Continue ISS and wean O2 Add mucinex  2. Polyuria: resolved UA shows no UTI  3. acute evisceration: Management as per surgery Continue meropenem for now Clear liquid diet 4. Essential hypertension on Norvasc  5. Diabetes: Continue sliding scale insulin DM coordinator consult  6. 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 POST operative anemia: 1 unit PRBC as per surgery  Management plans discussed with the patient and he is in agreement.  CODE STATUS: full  TOTAL TIME TAKING CARE OF THIS PATIENT: 23 minutes.     POSSIBLE D/C 2 days, DEPENDING ON CLINICAL CONDITION.   Meredith Kilbride M.D on 01/10/2017 at 12:27 PM  Between 7am to 6pm - Pager - [phone] After 6pm go to [email or web address] - password [email or web address] [facility]  Office  [phone]  CC: Primary care physician; Ezequiel Kayser, MD  Note: This dictation was prepared with Dragon dictation along with smaller phrase technology. Any transcriptional errors that result from this process are unintentional.

## 2017-01-10 NOTE — Progress Notes (Addendum)
Pod # 3 Tmax 100.3 Taking Po but feels bloated No flatus HB 7.2 he feels tires and somewhat light headed when he stands up AVSS Good U/O WBC coming down Alkalotic from lasix U/S gastrocnemius DVT  PE  Abd: mildly distended, retention sut in place, staples and penrose. No infection, no peritonitis. Dec bs Ext: edema about the same as yesterday  A/p  Ileus post op Anemia transfuse 1 unit sympt anemia Only sips of clears and ice chips If ileus persist may need to do CT A/p r/o abscess or complication On a/bs continue for now since  literature is conflicting  regarding anticoagulation for Gastro DVT we will consult Vascular surgery for their expertise.  Pulm toilet and mobilization

## 2017-01-10 NOTE — Care Management (Signed)
Patient admitted from home status post exploratory laparotomy and sigmoid colon resection last week presented with acute evisceration.  Patient lives at home with wife.  Patient has cane and walker in the home.  Patient open with [facility] for RN, PT, and OT.  PT has assessed patient and recommended home health PT.  Will require resumption of care orders at discharge.  RNCM following.

## 2017-01-10 NOTE — Progress Notes (Signed)
PT Cancellation Note  Patient Details Name: Evan [name] MRN: [id number] DOB: [birth year]   Cancelled Treatment:    Reason Eval/Treat Not Completed: Medical issues which prohibited therapy;  Pt with 2 DVT's, on in duplicated moiety of the left femoral vein and one in the right gastrocnemius vein.  Holding PT services per PT protocol.  Will continue to follow as appropriate.  Kyri Shader A Talena Neira, PT 01/10/2017, 2:27 PM

## 2017-01-10 NOTE — Consult Note (Signed)
[facility] Vascular Consult Note  MRN : [id number]  Zinedine Majid is a 72 y.o. (1945/12/25) male who presents with chief complaint of  Chief Complaint  Patient presents with  . Post-op Problem   History of Present Illness:  The patient is a 72 year old male with a past medical history of atrial fibrillation, BPH, CHF, DM, HTN and diverticulosis s/p exploratory laparotomy and sigmoid colon resection last week who presented with acute evisceration.   The patient endorses a history of bilateral lower extremity swelling and discomfort over the last few days. The patient underwent a venous duplex which was notable for: segmental occlusive DVT in a duplicated moiety of the left femoral vein and a isolated right gastrocnemius vein (calf) DVT. The patient states the swelling and discomfort have greatly improved in the last 24 hours. Denies history of DVT. Denies any SOB or chest pain. Denies fever, some nausea however no vomiting.   Patient is currently receiving a unit of blood for post-op anemia.   Vascular surgery consulted by Dr. Dahlia Byes for recommendations on anticoagulation.   Current Facility-Administered Medications  Medication Dose Route Frequency Provider Last Rate Last Dose  . 0.9 %  sodium chloride infusion   Intravenous Once Jules Husbands, MD      . acetaminophen (TYLENOL) tablet 1,000 mg  1,000 mg Oral Q6H Jules Husbands, MD   1,000 mg at 01/10/17 0454  . acetaZOLAMIDE (DIAMOX) injection 500 mg  500 mg Intravenous Q6H Jules Husbands, MD   500 mg at 01/10/17 0931  . amLODipine (NORVASC) tablet 5 mg  5 mg Oral Daily Florene Glen, MD   5 mg at 01/10/17 0925  . budesonide (PULMICORT) nebulizer solution 0.25 mg  0.25 mg Nebulization BID Theodoro Grist, MD   0.25 mg at 01/10/17 0732  . cholecalciferol (VITAMIN D) tablet 2,000 Units  2,000 Units Oral Daily Florene Glen, MD   2,000 Units at 01/10/17 [phone]  . codeine tablet 15 mg  15 mg Oral Q4H PRN Theodoro Grist,  MD   15 mg at 01/10/17 0454  . dextrose 5 % in lactated ringers infusion   Intravenous Continuous Diego F Pabon, MD      . docusate sodium (COLACE) capsule 100 mg  100 mg Oral Daily Florene Glen, MD   100 mg at 01/10/17 0925  . dorzolamide (TRUSOPT) 2 % ophthalmic solution 1 drop  1 drop Both Eyes BID Florene Glen, MD   1 drop at 01/10/17 0931   And  . timolol (TIMOPTIC) 0.5 % ophthalmic solution 1 drop  1 drop Both Eyes BID Florene Glen, MD   1 drop at 01/09/17 2153  . feeding supplement (BOOST / RESOURCE BREEZE) liquid 1 Container  1 Container Oral TID BM Florene Glen, MD   1 Container at 01/10/17 0914  . ferrous gluconate (FERGON) tablet 324 mg  324 mg Oral BID WC Florene Glen, MD   324 mg at [phone] [id number]  . folic acid (FOLVITE) tablet 1 mg  1 mg Oral Daily Florene Glen, MD   1 mg at 01/10/17 0925  . guaiFENesin (MUCINEX) 12 hr tablet 600 mg  600 mg Oral BID Bettey Costa, MD      . heparin injection 5,000 Units  5,000 Units Subcutaneous Q8H Florene Glen, MD   5,000 Units at 01/10/17 0455  . HYDROmorphone (DILAUDID) injection 0.5 mg  0.5 mg Intravenous Q2H PRN Jules Husbands, MD      .  insulin aspart (novoLOG) injection 0-15 Units  0-15 Units Subcutaneous TID WC Saundra Shelling, MD   3 Units at 01/10/17 0925  . insulin aspart (novoLOG) injection 0-5 Units  0-5 Units Subcutaneous QHS Pavan Pyreddy, MD      . ipratropium-albuterol (DUONEB) 0.5-2.5 (3) MG/3ML nebulizer solution 3 mL  3 mL Nebulization Q6H Sital Mody, MD   3 mL at 01/10/17 1318  . latanoprost (XALATAN) 0.005 % ophthalmic solution 1 drop  1 drop Both Eyes QHS Florene Glen, MD   1 drop at 01/09/17 2153  . meropenem (MERREM) IVPB SOLR 1 g  1 g Intravenous Q8H Florene Glen, MD   1 g at 01/10/17 0455  . multivitamin with minerals tablet   Oral Daily Florene Glen, MD   1 tablet at 01/10/17 [phone]  . omega-3 acid ethyl esters (LOVAZA) capsule 2 g  2 g Oral Daily Florene Glen, MD   2 g at 01/10/17 0925   . oxyCODONE (Oxy IR/ROXICODONE) immediate release tablet 10 mg  10 mg Oral Q3H PRN Diego F Pabon, MD      . pravastatin (PRAVACHOL) tablet 40 mg  40 mg Oral Daily Florene Glen, MD   40 mg at 01/10/17 [id number]   Past Medical History:  Diagnosis Date  . Atrial fibrillation ([place])   . BPH (benign prostatic hyperplasia)   . CHF (congestive heart failure) ([place])   . Diabetes mellitus without complication ([place])   . Diverticulosis   . Hyperlipidemia   . Hypertension   . Sleep apnea    Past Surgical History:  Procedure Laterality Date  . ATRIAL ABLATION SURGERY    . CHOLECYSTECTOMY    . LAPAROTOMY  01/01/2017   Procedure: EXPLORATORY LAPAROTOMY;  Surgeon: Olean Ree, MD;  Location: [facility];  Service: General;;  . PARTIAL COLECTOMY N/A 01/01/2017   Procedure: PARTIAL COLECTOMY;  Surgeon: Olean Ree, MD;  Location: [facility];  Service: General;  Laterality: N/A;  . PERIPHERAL VASCULAR CATHETERIZATION N/A 12/28/2016   Procedure: Visceral Artery Intervention;  Surgeon: Algernon Huxley, MD;  Location: [facility];  Service: Cardiovascular;  Laterality: N/A;  . SECONDARY CLOSURE OF WOUND N/A 01/08/2017   Procedure: SECONDARY CLOSURE FOR EVISCERATION;  Surgeon: Florene Glen, MD;  Location: [facility];  Service: General;  Laterality: N/A;  . TOTAL KNEE ARTHROPLASTY Right    Social History Social History  Substance Use Topics  . Smoking status: Former Smoker    Packs/day: 0.25    Years: 29.00    Types: Cigarettes    Quit date: 12/31/1993  . Smokeless tobacco: Never Used  . Alcohol use No   Family History Family History  Problem Relation Age of Onset  . Diabetes Sister   . Diabetes Brother   . Diabetes Sister   . Diabetes Brother   Denies any family history of bleeding / clotting disorders, PAD or renal disease.  Allergies  Allergen Reactions  . Azithromycin Rash  . Penicillins Rash    Has patient had a PCN reaction causing immediate rash, facial/tongue/throat swelling, SOB or  lightheadedness with hypotension: Yes Has patient had a PCN reaction causing severe rash involving mucus membranes or skin necrosis: Yes Has patient had a PCN reaction that required hospitalization No Has patient had a PCN reaction occurring within the last 10 years: No If all of the above answers are "NO", then may proceed with Cephalosporin use.   [name] Lisinopril Cough  . Losartan Cough  . Metoprolol Other (  See Comments)    Bradycardia.   REVIEW OF SYSTEMS (Negative unless checked)  Constitutional: [x] Weight loss  [] Fever  [] Chills Cardiac: [] Chest pain   [] Chest pressure   [] Palpitations   [] Shortness of breath when laying flat   [] Shortness of breath at rest   [] Shortness of breath with exertion. Vascular:  [x] Pain in legs with walking   [] Pain in legs at rest   [] Pain in legs when laying flat   [] Claudication   [] Pain in feet when walking  [] Pain in feet at rest  [] Pain in feet when laying flat   [x] History of DVT   [] Phlebitis   [x] Swelling in legs   [] Varicose veins   [] Non-healing ulcers Pulmonary:   [] Uses home oxygen   [] Productive cough   [] Hemoptysis   [] Wheeze  [] COPD   [] Asthma Neurologic:  [] Dizziness  [] Blackouts   [] Seizures   [] History of stroke   [] History of TIA  [] Aphasia   [] Temporary blindness   [] Dysphagia   [] Weakness or numbness in arms   [] Weakness or numbness in legs Musculoskeletal:  [] Arthritis   [x] Joint swelling   [] Joint pain   [] Low back pain Hematologic:  [] Easy bruising  [] Easy bleeding   [] Hypercoagulable state   [] Anemic  [] Hepatitis Gastrointestinal:  [] Blood in stool   [] Vomiting blood  [] Gastroesophageal reflux/heartburn   [] Difficulty swallowing. Genitourinary:  [] Chronic kidney disease   [] Difficult urination  [] Frequent urination  [] Burning with urination   [] Blood in urine Skin:  [] Rashes   [] Ulcers   [] Wounds Psychological:  [] History of anxiety   []  History of major depression.  Physical Examination  Vitals:   01/10/17 0609 01/10/17 0631  01/10/17 0822 01/10/17 1217  BP:   (!) 99/55 117/62  Pulse:   85 83  Resp:   20 17  Temp: 100.3 F (37.9 C) 99.5 F (37.5 C) 98.4 F (36.9 C) 98.2 F (36.8 C)  TempSrc: Oral Oral Oral Oral  SpO2:   100% 100%  Weight:      Height:       Body mass index is 37.75 kg/m. Gen:  WD/WN, NAD Head: [place]/AT, No temporalis wasting. Prominent temp pulse not noted. Ear/Nose/Throat: Hearing grossly intact, nares w/o erythema or drainage, oropharynx w/o Erythema/Exudate Eyes: Sclera non-icteric, conjunctiva clear Neck: Trachea midline.  No JVD.  Pulmonary:  Good air movement, respirations not labored, equal bilaterally.  Cardiac: RRR, normal S1, S2. Vascular:  Vessel Right Left  Radial Palpable Palpable  Ulnar Palpable Palpable  Brachial Palpable Palpable  Carotid Palpable, without bruit Palpable, without bruit  Aorta Not palpable N/A  Femoral Palpable Palpable  Popliteal Palpable Palpable  PT Palpable Palpable  DP Palpable Palpable   Right Lower Extremity: Moderate edema. No pain with dorsiflexion. No erythema. No cellulitis.  Left Lower Extremity: Moderate edema. No pain with dorsiflexion. No erythema. No cellulitis.  Gastrointestinal: Decreased bowel sounds. Mild distention.   Musculoskeletal: M/S 5/5 throughout.  Extremities without ischemic changes.  No deformity or atrophy.  Neurologic: Sensation grossly intact in extremities.  Symmetrical.  Speech is fluent. Motor exam as listed above. Psychiatric: Judgment intact, Mood & affect appropriate for pt's clinical situation. Dermatologic: No rashes or ulcers noted.   Lymph : No Cervical, Axillary, or Inguinal lymphadenopathy.  CBC Lab Results  Component Value Date   WBC 13.2 (H) 01/10/2017   HGB 7.2 (L) 01/10/2017   HCT 22.3 (L) 01/10/2017   MCV 84.1 01/10/2017   PLT 393 01/10/2017   BMET    Component Value Date/Time  NA 137 01/10/2017 0450   NA 136 03/26/2014 0418   K 3.8 01/10/2017 0450   K 4.0 03/26/2014 0418   CL 99  (L) 01/10/2017 0450   CL 102 03/26/2014 0418   CO2 33 (H) 01/10/2017 0450   CO2 34 (H) 03/26/2014 0418   GLUCOSE 149 (H) 01/10/2017 0450   GLUCOSE 114 (H) 03/26/2014 0418   BUN 7 01/10/2017 0450   BUN 8 03/26/2014 0418   CREATININE 0.65 01/10/2017 0450   CREATININE 0.90 03/26/2014 0418   CALCIUM 8.4 (L) 01/10/2017 0450   CALCIUM 9.2 03/26/2014 0418   GFRNONAA >60 01/10/2017 0450   GFRNONAA >60 03/26/2014 0418   GFRAA >60 01/10/2017 0450   GFRAA >60 03/26/2014 0418   Estimated Creatinine Clearance: 124.5 mL/min (by C-G formula based on SCr of 0.65 mg/dL).  COAG Lab Results  Component Value Date   INR 1.11 01/08/2017   INR 1.18 01/01/2017   INR 1.24 12/28/2016   Radiology Dg Chest 1 View  Result Date: 01/09/2017 CLINICAL DATA:  Wheezing EXAM: CHEST 1 VIEW COMPARISON:  01/04/2017 FINDINGS: Cardiomediastinal silhouette is stable. No infiltrate or pleural effusion. No pulmonary edema. Degenerative changes mid and lower thoracic spine. IMPRESSION: No active disease. Electronically Signed   By: Lahoma Crocker M.D.   On: 01/09/2017 12:17   Dg Chest 2 View  Result Date: 01/04/2017 CLINICAL DATA:  Shortness of breath, rapid heart rate EXAM: CHEST  2 VIEW COMPARISON:  None. FINDINGS: The heart size and mediastinal contours are within normal limits. Both lungs are clear. The visualized skeletal structures are unremarkable. IMPRESSION: No active cardiopulmonary disease. Electronically Signed   By: Kathreen Devoid   On: 01/04/2017 12:00   Nm Gi Blood Loss  Result Date: 12/27/2016 CLINICAL DATA:  GI bleed. EXAM: NUCLEAR MEDICINE GASTROINTESTINAL BLEEDING SCAN TECHNIQUE: Sequential abdominal images were obtained following intravenous administration of Tc-91m labeled red blood cells. RADIOPHARMACEUTICALS:  19.017 mCi Tc-67m in-vitro labeled red cells. COMPARISON:  None. FINDINGS: There is immediate abnormal activity in loops of bowel in the lower pelvis, probably in the sigmoid portion of the colon.  IMPRESSION: Abnormal GI bleeding scan. The study is positive for bleeding in the distal colon. Electronically Signed   By: Lorriane Shire M.D.   On: 12/27/2016 16:04   US Venous Img Lower Bilateral  Result Date: 01/09/2017 CLINICAL DATA:  Swelling x7 days EXAM: BILATERAL LOWER EXTREMITY VENOUS DOPPLER ULTRASOUND TECHNIQUE: Gray-scale sonography with compression, as well as color and duplex ultrasound, were performed to evaluate the deep venous system from the level of the common femoral vein through the popliteal and proximal calf veins. COMPARISON:  None FINDINGS: On the right, Normal compressibility of the common femoral, superficial femoral, and popliteal veins, as well as the proximal calf veins. No filling defects to suggest DVT on grayscale or color Doppler imaging. Doppler waveforms show normal direction of venous flow, normal respiratory phasicity and response to augmentation. There is thrombosis of a gastrocnemius vein with noncompressibility and no color Doppler signal. 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 One moiety is noncompressible with no color Doppler signal. Normal compressibility of common femoral, deep femoral, and popliteal veins as well was visualized calf veins. There is decreased response to augmentation in the popliteal vein. IMPRESSION: 1. 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 2. Isolated right gastrocnemius vein (calf) DVT. Electronically Signed   By: Lucrezia Europe M.D.   On: 01/09/2017 10:32   Dg  Colon W/air Hi Den Cm  Addendum Date: 12/29/2016   ADDENDUM REPORT: 12/29/2016 08:43 ADDENDUM: Findings and recommendations also conveyed toDr. Steele Sizer 12/29/2016 at8:00. Electronically Signed   By: Suzy Bouchard M.D.   On: 12/29/2016 08:43   Result Date: 12/29/2016 CLINICAL DATA:  Sigmoid colon gastrointestinal bleeding. EXAM: SINGLE COLUMN BARIUM ENEMA TECHNIQUE: Initial scout AP supine abdominal image obtained to insure  adequate colon cleansing. Barium was introduced into the colon in a retrograde fashion and refluxed from the rectum to the cecum. Spot images of the colon followed by overhead radiographs were obtained. FLUOROSCOPY TIME:  Fluoroscopy Time:  3.24 Radiation Exposure Index (if provided by the fluoroscopic device): 136 mGy Number of Acquired Spot Images: 2 COMPARISON:  None. FINDINGS: Intermittent inserted into the rectum and retention balloon inflated. High-density barium was introduced into the rectum via gravity feed. Contrast flowed into the sigmoid colon. Contrast could not be advanced past the sigmoid colon. 1200 cc was introduced with ultimately leakage around the enema tip and no advancement into the descending colon. No diverticular disease noted in the sigmoid colon. IMPRESSION: 1. High-density therapy barium enema for gastrointestinal bleeding per general surgery request. 2. No evidence of diverticular disease in the sigmoid colon. 3. Contrast could not be advanced past the proximal sigmoid colon. Cannot exclude obstructing mass. Recommend CT of the abdomen and pelvis for further evaluation (recognizing some artifact from the high-density barium). Initial Findings conveyed toJOSE PISCOYA on 12/29/2016  at3:45. Electronically Signed: By: Suzy Bouchard M.D. On: 12/29/2016 07:49   Assessment/Plan The patient is a 72 year old male with a past medical history of atrial fibrillation, BPH, CHF, DM, HTN and diverticulosis s/p exploratory laparotomy and sigmoid colon resection last week who presented with acute evisceration now with segmental occlusive DVT in a duplicated moiety of the left femoral vein and a isolated right gastrocnemius vein (calf) DVT - stable.  1. Bilateral Lower Extremity DVT: Normally would anticoagulate patient with Eliquis due to left femoral vein DVT however patient is currently receiving blood transfusion for post - operative anemia. At this time, would recommend ASA 81mg  PO daily  (this will also treat gastrocnemius DVT) will follow up in our office in one week s/p discharge for repeat venous duplex to assess for propagation of the DVT's and possibly start full anticoagulation at that time. No indication for IVC filter at this time.  2. Diabetes Mellitus: Encouraged good control as its slows the progression of atherosclerotic disease and impedes wound healing.  3. Hypertension: Encouraged good control as its slows the progression of atherosclerotic disease.  Discussed with Dr. Mayme Genta, PA-C  01/10/2017 2:19 PM

## 2017-01-11 ENCOUNTER — Encounter: Payer: Medicare Other | Admitting: Surgery

## 2017-01-11 LAB — BASIC METABOLIC PANEL
ANION GAP: 4 — AB (ref 5–15)
BUN: 6 mg/dL (ref 6–20)
CALCIUM: 8.5 mg/dL — AB (ref 8.9–10.3)
CO2: 29 mmol/L (ref 22–32)
Chloride: 106 mmol/L (ref 101–111)
Creatinine, Ser: 0.6 mg/dL — ABNORMAL LOW (ref 0.61–1.24)
GFR calc Af Amer: >60 mL/min (ref >60)
GLUCOSE: 196 mg/dL — AB (ref 65–99)
Potassium: 3.7 mmol/L (ref 3.5–5.1)
Sodium: 139 mmol/L (ref 135–145)

## 2017-01-11 LAB — GLUCOSE, CAPILLARY
GLUCOSE-CAPILLARY: 117 mg/dL — AB (ref 65–99)
Glucose-Capillary: 140 mg/dL — ABNORMAL HIGH (ref 65–99)
Glucose-Capillary: 133 mg/dL — ABNORMAL HIGH (ref 65–99)
Glucose-Capillary: 148 mg/dL — ABNORMAL HIGH (ref 65–99)

## 2017-01-11 LAB — CBC
HCT: 23.2 % — ABNORMAL LOW (ref 40.0–52.0)
HEMOGLOBIN: 7.6 g/dL — AB (ref 13.0–18.0)
MCH: 27.5 pg (ref 26.0–34.0)
MCHC: 33 g/dL (ref 32.0–36.0)
MCV: 83.3 fL (ref 80.0–100.0)
Platelets: 428 10*3/uL (ref 150–440)
RBC: 2.78 MIL/uL — ABNORMAL LOW (ref 4.40–5.90)
RDW: 16.3 % — AB (ref 11.5–14.5)
WBC: 10.4 10*3/uL (ref 3.8–10.6)

## 2017-01-11 LAB — TYPE AND SCREEN
Blood Product Expiration Date: 201801302359
ISSUE DATE / TIME: 201801111226
Unit Type and Rh: 7300

## 2017-01-11 MED ORDER — BUDESONIDE 0.25 MG/2ML IN SUSP: 0.2500 mg | Freq: Three times a day (TID) | RESPIRATORY_TRACT | Status: DC

## 2017-01-11 MED ORDER — GUAIFENESIN ER 600 MG PO TB12
600.0000 mg | ORAL_TABLET | Freq: Three times a day (TID) | ORAL | Status: DC
Start: 2017-01-11 — End: 2017-01-15
  Administered 2017-01-11 – 2017-01-15 (×12): 600 mg via ORAL
  Filled 2017-01-11 (×12): qty 1

## 2017-01-11 MED ORDER — BUDESONIDE 0.25 MG/2ML IN SUSP
0.2500 mg | Freq: Four times a day (QID) | RESPIRATORY_TRACT | Status: DC
  Administered 2017-01-11 – 2017-01-13 (×6): 0.25 mg via RESPIRATORY_TRACT
  Filled 2017-01-11 (×6): qty 2

## 2017-01-11 MED ORDER — ENOXAPARIN SODIUM 40 MG/0.4ML ~~LOC~~ SOLN
40.0000 mg | SUBCUTANEOUS | Status: DC
  Administered 2017-01-11 – 2017-01-14 (×4): 40 mg via SUBCUTANEOUS
  Filled 2017-01-11 (×4): qty 0.4

## 2017-01-11 MED ORDER — ASPIRIN EC 81 MG PO TBEC
81.0000 mg | DELAYED_RELEASE_TABLET | Freq: Every day | ORAL | Status: DC
  Administered 2017-01-11 – 2017-01-15 (×5): 81 mg via ORAL
  Filled 2017-01-11 (×5): qty 1

## 2017-01-11 NOTE — Progress Notes (Addendum)
[facility] [place] NAME: Evan [name]    MR#:  [id number]  DATE OF BIRTH:  12/22/1945  SUBJECTIVE:  Started on oral diet today No BM Cough better  REVIEW OF SYSTEMS:    Review of Systems  Constitutional: Negative.  Negative for chills, fever and malaise/fatigue.  HENT: Negative.  Negative for ear discharge, ear pain, hearing loss, nosebleeds and sore throat.   Eyes: Negative.  Negative for blurred vision and pain.  Respiratory: Positive for cough (better). Negative for hemoptysis, shortness of breath and wheezing.   Cardiovascular: Negative.  Negative for chest pain, palpitations and leg swelling.  Gastrointestinal: Negative.  Negative for abdominal pain, blood in stool, diarrhea, nausea and vomiting.  Genitourinary: Negative.  Negative for dysuria.  Musculoskeletal: Negative.  Negative for back pain.  Skin: Negative.   Neurological: Negative for dizziness, tremors, speech change, focal weakness, seizures and headaches.  Endo/Heme/Allergies: Negative.  Does not bruise/bleed easily.  Psychiatric/Behavioral: Negative.  Negative for depression, hallucinations and suicidal ideas.    Tolerating Diet:yes      DRUG ALLERGIES:   Allergies  Allergen Reactions  . Azithromycin Rash  . Penicillins Rash    Has patient had a PCN reaction causing immediate rash, facial/tongue/throat swelling, SOB or lightheadedness with hypotension: Yes Has patient had a PCN reaction causing severe rash involving mucus membranes or skin necrosis: Yes Has patient had a PCN reaction that required hospitalization No Has patient had a PCN reaction occurring within the last 10 years: No If all of the above answers are "NO", then may proceed with Cephalosporin use.   [name] Lisinopril Cough  . Losartan Cough  . Metoprolol Other (See Comments)    Bradycardia.    VITALS:  Blood pressure 130/68, pulse 74, temperature 98.3 F (36.8 C), temperature source Oral,  resp. rate 20, height 6\' 3"  (1.905 m), weight (!) 137 kg (302 lb), SpO2 99 %.  PHYSICAL EXAMINATION:   Physical Exam  Constitutional: He is oriented to person, place, and time and well-developed, well-nourished, and in no distress. No distress.  HENT:  Head: Normocephalic.  Eyes: No scleral icterus.  Neck: Normal range of motion. Neck supple. No JVD present. No tracheal deviation present.  Cardiovascular: Normal rate, regular rhythm and normal heart sounds.  Exam reveals no gallop and no friction rub.   No murmur heard. Pulmonary/Chest: Effort normal. No respiratory distress. He has no wheezes. He has no rales. He exhibits no tenderness.  Abdominal: Soft. He exhibits distension. He exhibits no mass. There is no rebound and no guarding.  Bandages on Penrose drain in place  Musculoskeletal: Normal range of motion. He exhibits no edema.  Neurological: He is alert and oriented to person, place, and time.  Skin: Skin is warm. No rash noted. No erythema.  Psychiatric: Affect and judgment normal.      LABORATORY PANEL:   CBC  Recent Labs Lab 01/11/17 0527  WBC 10.4  HGB 7.6*  HCT 23.2*  PLT 428   ------------------------------------------------------------------------------------------------------------------  Chemistries   Recent Labs Lab 01/09/17 0621  01/11/17 0527  NA 138  < > 139  K 4.1  < > 3.7  CL 101  < > 106  CO2 32  < > 29  GLUCOSE 171*  < > 196*  BUN 7  < > 6  CREATININE 0.73  < > 0.60*  CALCIUM 8.5*  < > 8.5*  AST 14*  --   --   ALT 15*  --   --  ALKPHOS 51  --   --   BILITOT 1.0  --   --   < > = values in this interval not displayed. ------------------------------------------------------------------------------------------------------------------  Cardiac Enzymes No results for input(s): TROPONINI in the last 168 hours. ------------------------------------------------------------------------------------------------------------------  RADIOLOGY:   Dg Chest 1 View  Result Date: 01/09/2017 CLINICAL DATA:  Wheezing EXAM: CHEST 1 VIEW COMPARISON:  01/04/2017 FINDINGS: Cardiomediastinal silhouette is stable. No infiltrate or pleural effusion. No pulmonary edema. Degenerative changes mid and lower thoracic spine. IMPRESSION: No active disease. Electronically Signed   By: Lahoma Crocker M.D.   On: 01/09/2017 12:17     ASSESSMENT AND PLAN:    72 year old male status post exploratory laparotomy and sigmoid colon resection last week presented with acute evisceration.   1. Hypoxic respiratory failure, acute: CXR is negative for CHF or PNA Continue ISS and wean O2 a stolerated Added mucinex for cough  2. Polyuria: resolved UA shows no UTI  3. acute evisceration: Management as per surgery Continue meropenem for now may stop tomorrow Diet as per surgery   4. Essential hypertension on Norvasc  5. Diabetes: Continue sliding scale insulin DM coordinator consulted RECS continue current care  6. 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 No indication for IVC filter at this time.  7. POST operative anemia: Received 1 unit PRBC but may need another unti, but patient does not want one today, he says.   Management plans discussed with the patient and wife he is in agreement.  CODE STATUS: full  TOTAL TIME TAKING CARE OF THIS PATIENT: 27 minutes.     POSSIBLE D/C 2 days, DEPENDING ON CLINICAL CONDITION.   Toye Rouillard M.D on 01/11/2017 at 10:45 AM  Between 7am to 6pm - Pager - [phone] After 6pm go to [email or web address] - password [email or web address] [facility]  Office  [phone]  CC: Primary care physician; Ezequiel Kayser, MD  Note: This dictation was prepared with Dragon dictation along  with smaller phrase technology. Any transcriptional errors that result from this process are unintentional.

## 2017-01-11 NOTE — Progress Notes (Signed)
Inpatient Diabetes Program Recommendations  AACE/ADA: New Consensus Statement on Inpatient Glycemic Control (2015)  Target Ranges:  Prepandial:   less than 140 mg/dL      Peak postprandial:   less than 180 mg/dL (1-2 hours)      Critically ill patients:  140 - 180 mg/dL   Lab Results  Component Value Date   GLUCAP 117 (H) 01/11/2017   HGBA1C 6.7 (H) 08/21/2012    Review of Glycemic Control  Results for Evan [name], Evan [name] (MRN [id number]) as of 01/11/2017 08:05  Ref. Range 01/10/2017 07:40 01/10/2017 11:41 01/10/2017 16:35 01/10/2017 21:03 01/11/2017 07:50  Glucose-Capillary Latest Ref Range: 65 - 99 mg/dL 151 (H) 147 (H) 149 (H) 183 (H) 117 (H)    Diabetes history:Type 2 Outpatient Diabetes medications: Glucophage 1000mg  with breakfast Current orders for Inpatient glycemic control: Novolog 0-15 units tid, novolog 0-5 units qhs  Inpatient Diabetes Program Recommendations:   Agree with current medications for blood sugar management.    Per ADA recommendations "consider performing an A1C on all patients with diabetes or hyperglycemia admitted to the hospital if not performed in the prior 3 months".    Gentry Fitz, RN, BA, MHA, CDE Diabetes Coordinator Inpatient Diabetes Program  [phone] (Team Pager) [phone] ([place]) 01/11/2017 8:06 AM

## 2017-01-11 NOTE — Progress Notes (Signed)
Physical Therapy Treatment Patient Details Name: Evan [name] MRN: [id number] DOB: 12/24/1945 Today's Date: 01/11/2017    History of Present Illness This a patient who is status post exploratory laparotomy and sigmoid colon resection for bleeding last week. His had no further bleeding and was at home when he coughed and noticed a pop with signs of bowel coming out of his incision.     PT Comments    Consulted nursing before beginning treatment, nursing said pt is okay to attempt treatment w/o supplemental O2. Chart reviewed and surgeon said patient's DVTs are stable enough to participate in PT treatment. Pt stated he was feeling much better today. O2 was 95% at rest in sitting position. Pt transferred from chair to standing under supervision. Ambulated with RW for 150 ft with normal gait pattern and stated he felt "fine" throughout ambulation. Gait was limited due to O2 dropping to 86% and patient was safely returned to his room. Once sitting O2 increased to 93%. Pt safe to ambulate with nursing staff as long as O2 levels remain stable. Pt will continue to benefit from skilled PT to improve balance and cardiopulmonary health.   Follow Up Recommendations  Home health PT     Equipment Recommendations  None recommended by PT    Recommendations for Other Services       Precautions / Restrictions Precautions Precautions: Other (comment) Precaution Comments: monitor O2 levels Required Braces or Orthoses:  (no longer requires abdominal brace per nursing ) Restrictions Weight Bearing Restrictions: No    Mobility  Bed Mobility               General bed mobility comments: did not assess, pt sitting in chair before treatment  Transfers Overall transfer level: Needs assistance Equipment used: Rolling walker (2 wheeled) Transfers: Sit to/from Stand Sit to Stand: Supervision         General transfer comment: using B UE's on chair for lift off with minimal forward lean,  transfered w/o difficulty, no change O2 when standing  Ambulation/Gait Ambulation/Gait assistance: Supervision Ambulation Distance (Feet): 150 Feet Assistive device: Rolling walker (2 wheeled) Gait Pattern/deviations: WFL(Within Functional Limits);Step-through pattern     General Gait Details: steady reciprocal gait pattern, no complaints of soreness, walking limited by drop in O2 to 86% from 94% at rest   Stairs            Wheelchair Mobility    Modified Rankin (Stroke Patients Only)       Balance Overall balance assessment: Modified Independent Sitting-balance support: Feet supported Sitting balance-Leahy Scale: Normal     Standing balance support: Bilateral upper extremity supported Standing balance-Leahy Scale: Good                      Cognition Arousal/Alertness: Awake/alert Behavior During Therapy: WFL for tasks assessed/performed Overall Cognitive Status: Within Functional Limits for tasks assessed                      Exercises      General Comments        Pertinent Vitals/Pain Pain Assessment: No/denies pain    Home Living                      Prior Function            PT Goals (current goals can now be found in the care plan section) Acute Rehab PT Goals Patient Stated Goal: To go home and get  better. PT Goal Formulation: With patient Time For Goal Achievement: 01/16/17 Potential to Achieve Goals: Good Progress towards PT goals: Progressing toward goals    Frequency    Min 2X/week      PT Plan Current plan remains appropriate    Co-evaluation             End of Session Equipment Utilized During Treatment: Gait belt (No supplemental O2 ) Activity Tolerance: Patient tolerated treatment well Patient left: in chair;with call bell/phone within reach;with chair alarm set     Time: [id number] PT Time Calculation (min) (ACUTE ONLY): 14 min  Charges:                       G Codes:      Nana Vastine 08-Feb-2017, 1:59 PM

## 2017-01-11 NOTE — Progress Notes (Signed)
POD # 3 s/p Repair of evisceration w retention Doing a bit better Persistent cough HB stable, feels better after 1 unit of blood Passing flatus and is hungry AVSS WBC trending down  PE NAD Abd: soft, retention sut in place along w penrose. No peritonitis or infection Ext: well perfused and some edema  A/p Slowly improving Advance diet Mobilize There is no contraindication to continue PT w his distal DVT Appreciate Vascular input regarding distal DVT, repeat U/D duplex next week and do ASA for now Continue A/B will DC once his bowel fx returns his wbc is normalized and he is afebrile

## 2017-01-11 NOTE — Progress Notes (Signed)
Pharmacy Antibiotic Note  Evan [name] is a 72 y.o. male admitted on 01/07/2017 with intra-abdominal infection and bowel evisceration s/p operation 01/07/17.  Plan: Continue meropenem 1 g q8 hours.  Height: 6\' 3"  (190.5 cm) Weight: (!) 302 lb (137 kg) IBW/kg (Calculated) : 84.5  Temp (24hrs), Avg:98.3 F (36.8 C), Min:97.9 F (36.6 C), Max:98.7 F (37.1 C)   Recent Labs Lab 01/08/17 0503 01/09/17 0621 01/10/17 0450 01/10/17 1719 01/11/17 0527  WBC 14.8* 18.3* 13.2* 13.8* 10.4  CREATININE 0.75 0.73 0.65 0.61 0.60*    Estimated Creatinine Clearance: 124.5 mL/min (by C-G formula based on SCr of 0.6 mg/dL (L)).    Allergies  Allergen Reactions  . Azithromycin Rash  . Penicillins Rash    Has patient had a PCN reaction causing immediate rash, facial/tongue/throat swelling, SOB or lightheadedness with hypotension: Yes Has patient had a PCN reaction causing severe rash involving mucus membranes or skin necrosis: Yes Has patient had a PCN reaction that required hospitalization No Has patient had a PCN reaction occurring within the last 10 years: No If all of the above answers are "NO", then may proceed with Cephalosporin use.   [name] Lisinopril Cough  . Losartan Cough  . Metoprolol Other (See Comments)    Bradycardia.    Antimicrobials this admission: cipro x 1 Meropenem 1/9 >>   Dose adjustments this admission:  Microbiology results: 1/9 BCx: NGTD  Thank you for allowing pharmacy to be a part of this patient's care.  Ulice Dash, PharmD Clinical Pharmacist  01/11/2017 10:41 AM

## 2017-01-11 NOTE — Care Management Important Message (Signed)
Important Message  Patient Details  Name: Evan [name] MRN: [id number] Date of Birth: December 18, 1945   Medicare Important Message Given:  Yes    Beverly Sessions, RN 01/11/2017, 4:42 PM

## 2017-01-12 LAB — BASIC METABOLIC PANEL
ANION GAP: 5 (ref 5–15)
BUN: 7 mg/dL (ref 6–20)
CALCIUM: 8.5 mg/dL — AB (ref 8.9–10.3)
CHLORIDE: 107 mmol/L (ref 101–111)
CO2: 27 mmol/L (ref 22–32)
Creatinine, Ser: 0.59 mg/dL — ABNORMAL LOW (ref 0.61–1.24)
GFR calc non Af Amer: >60 mL/min (ref >60)
Glucose, Bld: 116 mg/dL — ABNORMAL HIGH (ref 65–99)
Potassium: 3.7 mmol/L (ref 3.5–5.1)
Sodium: 139 mmol/L (ref 135–145)

## 2017-01-12 LAB — GLUCOSE, CAPILLARY
GLUCOSE-CAPILLARY: 121 mg/dL — AB (ref 65–99)
GLUCOSE-CAPILLARY: 190 mg/dL — AB (ref 65–99)
GLUCOSE-CAPILLARY: 120 mg/dL — AB (ref 65–99)
Glucose-Capillary: 180 mg/dL — ABNORMAL HIGH (ref 65–99)

## 2017-01-12 LAB — CBC
HCT: 24.2 % — ABNORMAL LOW (ref 40.0–52.0)
HEMOGLOBIN: 7.8 g/dL — AB (ref 13.0–18.0)
MCH: 26.4 pg (ref 26.0–34.0)
MCHC: 32.1 g/dL (ref 32.0–36.0)
MCV: 82.3 fL (ref 80.0–100.0)
Platelets: 484 10*3/uL — ABNORMAL HIGH (ref 150–440)
RBC: 2.93 MIL/uL — AB (ref 4.40–5.90)
RDW: 16.9 % — ABNORMAL HIGH (ref 11.5–14.5)
WBC: 9.8 10*3/uL (ref 3.8–10.6)

## 2017-01-12 NOTE — Progress Notes (Signed)
[place] [facility] NAME: Evan [name]    MR#:  [id number]  DATE OF BIRTH:  June 09, 1945  SUBJECTIVE:  Patient is doing very well this morning. He had a bowel movement this morning. He is tolerating his diet. His cough has improved. He is off of oxygen. REVIEW OF SYSTEMS:    Review of Systems  Constitutional: Negative.  Negative for chills, fever and malaise/fatigue.  HENT: Negative.  Negative for ear discharge, ear pain, hearing loss, nosebleeds and sore throat.   Eyes: Negative.  Negative for blurred vision and pain.  Respiratory: Negative for cough (better), hemoptysis, shortness of breath and wheezing.   Cardiovascular: Negative.  Negative for chest pain, palpitations and leg swelling.  Gastrointestinal: Negative.  Negative for abdominal pain, blood in stool, diarrhea, nausea and vomiting.  Genitourinary: Negative.  Negative for dysuria.  Musculoskeletal: Negative.  Negative for back pain.  Skin: Negative.   Neurological: Negative for dizziness, tremors, speech change, focal weakness, seizures and headaches.  Endo/Heme/Allergies: Negative.  Does not bruise/bleed easily.  Psychiatric/Behavioral: Negative.  Negative for depression, hallucinations and suicidal ideas.    Tolerating Diet:yes      DRUG ALLERGIES:   Allergies  Allergen Reactions  . Azithromycin Rash  . Penicillins Rash    Has patient had a PCN reaction causing immediate rash, facial/tongue/throat swelling, SOB or lightheadedness with hypotension: Yes Has patient had a PCN reaction causing severe rash involving mucus membranes or skin necrosis: Yes Has patient had a PCN reaction that required hospitalization No Has patient had a PCN reaction occurring within the last 10 years: No If all of the above answers are "NO", then may proceed with Cephalosporin use.   [name] Lisinopril Cough  . Losartan Cough  . Metoprolol Other (See Comments)    Bradycardia.    VITALS:   Blood pressure 130/70, pulse 81, temperature 98 F (36.7 C), temperature source Oral, resp. rate 20, height 6\' 3"  (1.905 m), weight (!) 137 kg (302 lb), SpO2 96 %.  PHYSICAL EXAMINATION:   Physical Exam  Constitutional: He is oriented to person, place, and time and well-developed, well-nourished, and in no distress. No distress.  HENT:  Head: Normocephalic.  Eyes: No scleral icterus.  Neck: Normal range of motion. Neck supple. No JVD present. No tracheal deviation present.  Cardiovascular: Normal rate, regular rhythm and normal heart sounds.  Exam reveals no gallop and no friction rub.   No murmur heard. Pulmonary/Chest: Effort normal. No respiratory distress. He has no wheezes. He has no rales. He exhibits no tenderness.  Abdominal: Soft. Bowel sounds are normal. He exhibits no distension and no mass. There is no rebound and no guarding.   Penrose drain in place  Musculoskeletal: Normal range of motion. He exhibits no edema.  Neurological: He is alert and oriented to person, place, and time.  Skin: Skin is warm. No rash noted. No erythema.  Psychiatric: Affect and judgment normal.      LABORATORY PANEL:   CBC  Recent Labs Lab 01/12/17 0641  WBC 9.8  HGB 7.8*  HCT 24.2*  PLT 484*   ------------------------------------------------------------------------------------------------------------------  Chemistries   Recent Labs Lab 01/09/17 0621  01/12/17 0641  NA 138  < > 139  K 4.1  < > 3.7  CL 101  < > 107  CO2 32  < > 27  GLUCOSE 171*  < > 116*  BUN 7  < > 7  CREATININE 0.73  < > 0.59*  CALCIUM  8.5*  < > 8.5*  AST 14*  --   --   ALT 15*  --   --   ALKPHOS 51  --   --   BILITOT 1.0  --   --   < > = values in this interval not displayed. ------------------------------------------------------------------------------------------------------------------  Cardiac Enzymes No results for input(s): TROPONINI in the last 168  hours. ------------------------------------------------------------------------------------------------------------------  RADIOLOGY:  No results found.   ASSESSMENT AND PLAN:    72 year old male status post exploratory laparotomy and sigmoid colon resection last week presented with acute evisceration.   1.Hypoxic respiratory failure, acute: CXR was negative for CHF or PNA He is now off of oxygen and symptoms have improved Continue ISS and Mucinex   2. Polyuria: resolved UA showed no evidence of UTI  3. acute evisceration: Management as per surgery    4. Essential hypertension on Norvasc  5. Diabetes: Continue sliding scale insulin DM coordinator consulted RECS continue current care  6. 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 No indication for IVC filter at this time.   7. POST operative anemia: Received 1 unit PRBC  Hemoglobin 7.8 this morning  Patient may be discharged tomorrow and if he is being discharged and patient will continue outpatient regimen. Patient may have Mucinex at discharge and encouragement to drink fluids and ambulate.  I will sign off. Please call me. Further questions.  Management plans discussed with the patient and wife he is in agreement.  CODE STATUS: full  TOTAL TIME TAKING CARE OF THIS PATIENT: 24 minutes.     POSSIBLE D/C tomorrow, DEPENDING ON CLINICAL CONDITION.   Mikhala Kenan M.D on 01/12/2017 at 9:45 AM  Between 7am to 6pm - Pager - [phone] After 6pm go to [email or web address] - password [email or web address] [facility]  Office  [phone]  CC: Primary care physician; Ezequiel Kayser, MD  Note: This dictation was prepared with Dragon dictation along with smaller phrase technology. Any  transcriptional errors that result from this process are unintentional.

## 2017-01-12 NOTE — Progress Notes (Signed)
01/12/2017  Subjective: Patient is 4 Days Post-Op s/p exploratory laparotomy with closure of fascial dehiscence.  No acute events overnight.  Reports that his cough is somewhat better with the mucinex.  Currently on a breathing treatment for wheezing overnight.  Has ambulated but his O2 sat dropped to 86% with PT.  Had bowel movement this morning.  Vital signs: Temp:  [98 F (36.7 C)-98.5 F (36.9 C)] 98 F (36.7 C) (01/13 0835) Pulse Rate:  [81-99] 81 (01/13 0835) Resp:  [18-20] 20 (01/13 0835) BP: (130-132)/(69-75) 130/70 (01/13 0835) SpO2:  [91 %-99 %] 94 % (01/13 0835)   Intake/Output: 01/12 0701 - 01/13 0700 In: 580.2 [P.O.:240; I.V.:69.2; IV Piggyback:271] Out: 850 [Urine:850] Last BM Date: 01/12/17  Physical Exam: Constitutional:  No acute distress Abdomen:  Soft, nondistended, obese, appropriately tender.  Incision is clean with staples and bolster sutures.  Penrose drain in place tunneling under incision from superior to inferior aspect.  Serous drainage noted on dressing.  New dressing applied. No evidence of infection.  Labs:   Recent Labs  01/11/17 0527 01/12/17 0641  WBC 10.4 9.8  HGB 7.6* 7.8*  HCT 23.2* 24.2*  PLT 428 484*    Recent Labs  01/11/17 0527 01/12/17 0641  NA 139 139  K 3.7 3.7  CL 106 107  CO2 29 27  GLUCOSE 196* 116*  BUN 6 7  CREATININE 0.60* 0.59*  CALCIUM 8.5* 8.5*   No results for input(s): LABPROT, INR in the last 72 hours.  Imaging: No results found.  Assessment/Plan: 72 yo male s/p exploratory laparotomy with closure of fascial dehiscence.  --Continue diet as tolerated. --Continue ambulation and working with PT.  Continue breathing treatments and mucinex.  CXR negative for any pulmonary edema or pneumonia. --Possible discharge tomorrow depending on how he's doing. --Will stop meropenem today.  Appreciate hospitalist input and recs. --Repeat venous doppler after discharge with follow up with vascular surgery for DVT noted  in BLE.  Continue ASA for now.   Melvyn Neth, [place]

## 2017-01-13 LAB — GLUCOSE, CAPILLARY
GLUCOSE-CAPILLARY: 145 mg/dL — AB (ref 65–99)
GLUCOSE-CAPILLARY: 200 mg/dL — AB (ref 65–99)
GLUCOSE-CAPILLARY: 143 mg/dL — AB (ref 65–99)
Glucose-Capillary: 136 mg/dL — ABNORMAL HIGH (ref 65–99)

## 2017-01-13 LAB — CULTURE, BLOOD (ROUTINE X 2)
CULTURE: NO GROWTH
Culture: NO GROWTH

## 2017-01-13 LAB — HEMOGLOBIN AND HEMATOCRIT, BLOOD
HEMATOCRIT: 24.6 % — AB (ref 40.0–52.0)
Hemoglobin: 8.1 g/dL — ABNORMAL LOW (ref 13.0–18.0)

## 2017-01-13 MED ORDER — BUDESONIDE 0.25 MG/2ML IN SUSP
0.2500 mg | Freq: Two times a day (BID) | RESPIRATORY_TRACT | Status: DC
  Administered 2017-01-13 – 2017-01-15 (×5): 0.25 mg via RESPIRATORY_TRACT
  Filled 2017-01-13 (×5): qty 2

## 2017-01-13 MED ORDER — IPRATROPIUM-ALBUTEROL 0.5-2.5 (3) MG/3ML IN SOLN
3.0000 mL | Freq: Two times a day (BID) | RESPIRATORY_TRACT | Status: DC
  Administered 2017-01-13 – 2017-01-15 (×5): 3 mL via RESPIRATORY_TRACT
  Filled 2017-01-13 (×5): qty 3

## 2017-01-13 NOTE — Progress Notes (Signed)
POD # 5 AVSS Good U/O Hb 8.1 Taking PO  PE NAD Chest Some rales, NSR Abd: penrose in place, staples intact. Retentions in place  A/p Doing well Still needs a bit of support w PT He is not quite ready and not confident enough to go home today We will arrange for home PT Will need Vascular surgery appt w U/S to f/u on DVT Dc in am

## 2017-01-14 ENCOUNTER — Inpatient Hospital Stay: Payer: Medicare Other

## 2017-01-14 LAB — GLUCOSE, CAPILLARY
Glucose-Capillary: 134 mg/dL — ABNORMAL HIGH (ref 65–99)
Glucose-Capillary: 213 mg/dL — ABNORMAL HIGH (ref 65–99)
Glucose-Capillary: 89 mg/dL (ref 65–99)
Glucose-Capillary: 112 mg/dL — ABNORMAL HIGH (ref 65–99)

## 2017-01-14 MED ORDER — HYDROCOD POLST-CPM POLST ER 10-8 MG/5ML PO SUER
5.0000 mL | Freq: Two times a day (BID) | ORAL | Status: DC
  Administered 2017-01-14 – 2017-01-15 (×3): 5 mL via ORAL
  Filled 2017-01-14 (×3): qty 5

## 2017-01-14 NOTE — Progress Notes (Signed)
Physical Therapy Treatment Patient Details Name: Eithen Prier MRN: [id number] DOB: [birth year] Today's Date: 01/14/2017    History of Present Illness This a patient who is status post exploratory laparotomy and sigmoid colon resection for bleeding last week. His had no further bleeding and was at home when he coughed and noticed a pop with signs of bowel coming out of his incision.     PT Comments    Pt in chair, ready to ambulate.  Ambulated x 2 around unit with walker and occasional standing rest breaks due to fatigue.  No SOB noted or reported.  Pt reports having a walker at home and encouraged to use it upon discharge until strength is gained.  Agreed.  To bathroom with large BM.  Generally steady with gait with no lob's limited by general fatigue.   Follow Up Recommendations  Home health PT     Equipment Recommendations  None recommended by PT    Recommendations for Other Services       Precautions / Restrictions Precautions Precautions: Other (comment) Precaution Comments: abdominal incision Restrictions Weight Bearing Restrictions: No    Mobility  Bed Mobility               General bed mobility comments: did not assess, pt sitting in chair before treatment  Transfers Overall transfer level: Modified independent Equipment used: Rolling walker (2 wheeled) Transfers: Sit to/from Stand Sit to Stand: Supervision Stand pivot transfers: Supervision          Ambulation/Gait Ambulation/Gait assistance: Supervision Ambulation Distance (Feet): 350 Feet Assistive device: Rolling walker (2 wheeled) Gait Pattern/deviations: WFL(Within Functional Limits);Step-through pattern   Gait velocity interpretation: Below normal speed for age/gender     Stairs            Wheelchair Mobility    Modified Rankin (Stroke Patients Only)       Balance Overall balance assessment: Modified Independent Sitting-balance support: Feet supported Sitting balance-Leahy  Scale: Normal     Standing balance support: Bilateral upper extremity supported Standing balance-Leahy Scale: Good                      Cognition Arousal/Alertness: Awake/alert Behavior During Therapy: WFL for tasks assessed/performed Overall Cognitive Status: Within Functional Limits for tasks assessed                      Exercises Other Exercises Other Exercises: to bathroom with supervision.  reported large BM    General Comments        Pertinent Vitals/Pain Pain Assessment: No/denies pain    Home Living                      Prior Function            PT Goals (current goals can now be found in the care plan section) Progress towards PT goals: Progressing toward goals    Frequency    Min 2X/week      PT Plan Current plan remains appropriate    Co-evaluation             End of Session Equipment Utilized During Treatment: Gait belt Activity Tolerance: Patient tolerated treatment well Patient left: in chair;with call bell/phone within reach;with chair alarm set;with family/visitor present     Time: [id number] PT Time Calculation (min) (ACUTE ONLY): 14 min  Charges:  $Gait Training: 8-22 mins  G Codes:      Chesley Noon 01/14/2017, 4:09 PM

## 2017-01-14 NOTE — Progress Notes (Signed)
Subjective:   The patient's main complaints or cough and loss of visual acuity in his left eye. He has no significant abdominal pain. He's not had a bowel movement as yet but is passing some gas. He denies any nausea or vomiting.  Vital signs in last 24 hours: Temp:  [98.2 F (36.8 C)-99.3 F (37.4 C)] 98.4 F (36.9 C) (01/15 0751) Pulse Rate:  [77-81] 77 (01/15 0751) Resp:  [16-20] 20 (01/15 0751) BP: (134-145)/(57-68) 134/63 (01/15 0751) SpO2:  [93 %-99 %] 93 % (01/15 0758) FiO2 (%):  [21 %] 21 % (01/15 0758) Last BM Date: 01/12/17  Intake/Output from previous day: 01/14 0701 - 01/15 0700 In: 720 [P.O.:720] Out: 2850 [Urine:2850]  Exam:  Abdomen is soft. His wounds look good. Penrose drain is draining serosanguineous material. He has no rebound or guarding. There is no sign of any wound dehiscence.  His lungs are clear.  He does have significant field loss in his left eye from just casual examination. His right eye appears to have normal vision for this age and history. His visual acuity on the left side is also markedly changed.  Lab Results:  CBC  Recent Labs  01/12/17 0641 01/13/17 0533  WBC 9.8  --   HGB 7.8* 8.1*  HCT 24.2* 24.6*  PLT 484*  --    CMP     Component Value Date/Time   NA 139 01/12/2017 0641   NA 136 03/26/2014 0418   K 3.7 01/12/2017 0641   K 4.0 03/26/2014 0418   CL 107 01/12/2017 0641   CL 102 03/26/2014 0418   CO2 27 01/12/2017 0641   CO2 34 (H) 03/26/2014 0418   GLUCOSE 116 (H) 01/12/2017 0641   GLUCOSE 114 (H) 03/26/2014 0418   BUN 7 01/12/2017 0641   BUN 8 03/26/2014 0418   CREATININE 0.59 (L) 01/12/2017 0641   CREATININE 0.90 03/26/2014 0418   CALCIUM 8.5 (L) 01/12/2017 0641   CALCIUM 9.2 03/26/2014 0418   PROT 5.1 (L) 01/09/2017 0621   PROT 6.3 (L) 10/04/2013 0533   ALBUMIN 2.3 (L) 01/09/2017 0621   ALBUMIN 3.0 (L) 10/04/2013 0533   AST 14 (L) 01/09/2017 0621   AST 8 (L) 10/04/2013 0533   ALT 15 (L) 01/09/2017 0621   ALT 17  10/04/2013 0533   ALKPHOS 51 01/09/2017 0621   ALKPHOS 92 10/04/2013 0533   BILITOT 1.0 01/09/2017 0621   BILITOT 1.7 (H) 10/04/2013 0533   GFRNONAA >60 01/12/2017 0641   GFRNONAA >60 03/26/2014 0418   GFRAA >60 01/12/2017 0641   GFRAA >60 03/26/2014 0418   PT/INR No results for input(s): LABPROT, INR in the last 72 hours.  Studies/Results: No results found.  Assessment/Plan: We will repeat his chest x-ray. He is a regular patient of the [facility]. we will see if one of the ophthalmologists can see him in the hospital during this admission for his significant visual change.

## 2017-01-14 NOTE — Consult Note (Signed)
Reason for Consult:decreased vision since surgery Referring Physician: Jeanie Cooks, MD  Evan [name] is an 72 y.o. male.  Chief complaint:  Patient with a past ocular history of glaucoma and cataracts had a laparotomy and resection of colon related to diverticulitis.  Patient experienced substantial blood loss intraoperatively and received several units of blood transfused.  After the surgery, the patient noted decreased vision.  He reports that this has been stable over nearly 2 weeks.  He was readmitted for wound repair and the consult to ophthalmology was placed.  He denies eye pain, flashes, floaters, or decreased vision in the right eye.  Past Medical History:  Diagnosis Date  . Atrial fibrillation ([place])   . BPH (benign prostatic hyperplasia)   . CHF (congestive heart failure) ([place])   . Diabetes mellitus without complication ([place])   . Diverticulosis   . Hyperlipidemia   . Hypertension   . Sleep apnea     Past Ocular History includes Glaucoma and cataracts.  Has been several times at [facility] center and glaucoma is managed by Dr. Joan Mayans.  ROS  Past Surgical History:  Procedure Laterality Date  . ATRIAL ABLATION SURGERY    . CHOLECYSTECTOMY    . LAPAROTOMY  01/01/2017   Procedure: EXPLORATORY LAPAROTOMY;  Surgeon: Olean Ree, MD;  Location: [facility] ORS;  Service: General;;  . PARTIAL COLECTOMY N/A 01/01/2017   Procedure: PARTIAL COLECTOMY;  Surgeon: Olean Ree, MD;  Location: [facility] ORS;  Service: General;  Laterality: N/A;  . PERIPHERAL VASCULAR CATHETERIZATION N/A 12/28/2016   Procedure: Visceral Artery Intervention;  Surgeon: Algernon Huxley, MD;  Location: [facility];  Service: Cardiovascular;  Laterality: N/A;  . SECONDARY CLOSURE OF WOUND N/A 01/08/2017   Procedure: SECONDARY CLOSURE FOR EVISCERATION;  Surgeon: Florene Glen, MD;  Location: [facility] ORS;  Service: General;  Laterality: N/A;  . TOTAL KNEE ARTHROPLASTY Right     Family History  Problem Relation  Age of Onset  . Diabetes Sister   . Diabetes Brother   . Diabetes Sister   . Diabetes Brother     Social History:  reports that he quit smoking about 23 years ago. His smoking use included Cigarettes. He has a 7.25 pack-year smoking history. He has never used smokeless tobacco. He reports that he does not drink alcohol. His drug history is not on file.  Allergies:  Allergies  Allergen Reactions  . Azithromycin Rash  . Penicillins Rash    Has patient had a PCN reaction causing immediate rash, facial/tongue/throat swelling, SOB or lightheadedness with hypotension: Yes Has patient had a PCN reaction causing severe rash involving mucus membranes or skin necrosis: Yes Has patient had a PCN reaction that required hospitalization No Has patient had a PCN reaction occurring within the last 10 years: No If all of the above answers are "NO", then may proceed with Cephalosporin use.   [name] Lisinopril Cough  . Losartan Cough  . Metoprolol Other (See Comments)    Bradycardia.   No current facility-administered medications on file prior to encounter.    Current Outpatient Prescriptions on File Prior to Encounter  Medication Sig Dispense Refill  . acetaminophen (TYLENOL) 500 MG tablet Take 1,000 mg by mouth every 6 (six) hours as needed.    [name] amLODipine (NORVASC) 5 MG tablet Take 5 mg by mouth daily.    . Cholecalciferol (VITAMIN D3) 2000 units capsule Take 2,000 Units by mouth daily.    [name] docusate calcium (SURFAK) 240 MG capsule Take  1 capsule by mouth daily.    . dorzolamide-timolol (COSOPT) 22.3-6.8 MG/ML ophthalmic solution Apply 1 drop to eye 2 (two) times daily.    . feeding supplement (BOOST / RESOURCE BREEZE) LIQD Take 1 Container by mouth 3 (three) times daily between meals. 90 Container 5  . ferrous gluconate (FERGON) 324 MG tablet Take 1 tablet (324 mg total) by mouth 2 (two) times daily with a meal. 60 tablet 5  . latanoprost (XALATAN) 0.005 % ophthalmic solution Apply 1 drop to eye at  bedtime.    . metFORMIN (GLUCOPHAGE) 1000 MG tablet Take 1,000 mg by mouth daily with breakfast.     . Multiple Vitamins-Minerals (MULTIVITAMIN ADULT PO) Take 1 tablet by mouth daily.    . Omega-3 Fatty Acids (FISH OIL) 1200 MG CAPS Take 2 capsules by mouth daily.    . pravastatin (PRAVACHOL) 40 MG tablet Take 40 mg by mouth daily.       Results for orders placed or performed during the hospital encounter of 01/07/17 (from the past 48 hour(s))  Glucose, capillary     Status: Abnormal   Collection Time: 01/12/17  9:24 PM  Result Value Ref Range   Glucose-Capillary 120 (H) 65 - 99 mg/dL  Hemoglobin and hematocrit, blood     Status: Abnormal   Collection Time: 01/13/17  5:33 AM  Result Value Ref Range   Hemoglobin 8.1 (L) 13.0 - 18.0 g/dL   HCT 24.6 (L) 40.0 - 52.0 %  Glucose, capillary     Status: Abnormal   Collection Time: 01/13/17  8:00 AM  Result Value Ref Range   Glucose-Capillary 145 (H) 65 - 99 mg/dL   Comment 1 Notify RN   Glucose, capillary     Status: Abnormal   Collection Time: 01/13/17 11:41 AM  Result Value Ref Range   Glucose-Capillary 200 (H) 65 - 99 mg/dL   Comment 1 Notify RN   Glucose, capillary     Status: Abnormal   Collection Time: 01/13/17  4:37 PM  Result Value Ref Range   Glucose-Capillary 143 (H) 65 - 99 mg/dL   Comment 1 Notify RN   Glucose, capillary     Status: Abnormal   Collection Time: 01/13/17  9:03 PM  Result Value Ref Range   Glucose-Capillary 136 (H) 65 - 99 mg/dL  Glucose, capillary     Status: Abnormal   Collection Time: 01/14/17  7:26 AM  Result Value Ref Range   Glucose-Capillary 134 (H) 65 - 99 mg/dL   Comment 1 Notify RN   Glucose, capillary     Status: Abnormal   Collection Time: 01/14/17 11:35 AM  Result Value Ref Range   Glucose-Capillary 213 (H) 65 - 99 mg/dL   Comment 1 Notify RN   Glucose, capillary     Status: None   Collection Time: 01/14/17  4:48 PM  Result Value Ref Range   Glucose-Capillary 89 65 - 99 mg/dL   Comment  1 Notify RN     Dg Chest 2 View  Result Date: 01/14/2017 CLINICAL DATA:  Cough, former smoker EXAM: CHEST  2 VIEW COMPARISON:  01/09/2017 FINDINGS: Cardiomediastinal silhouette is stable. There is streaky atelectasis or infiltrate in right middle lobe. No pulmonary edema. Degenerative changes thoracic spine. IMPRESSION: No pulmonary edema. Streaky atelectasis or infiltrate in right middle lobe. Electronically Signed   By: Lahoma Crocker M.D.   On: 01/14/2017 12:00    Blood pressure (!) 141/64, pulse 83, temperature 98.2 F (36.8 C), temperature source Oral,  resp. rate 20, height 6\' 3"  (1.905 m), weight (!) 137 kg (302 lb), SpO2 100 %.  Mental status: Alert and Oriented x 4  Visual Acuity:  20/40 OD  CF near cc  Pupils:  3+ afferent pupillary defect, left eye.  Motility:  Full/ orthophoric  Visual Fields:  Full to confrontation, count fingers.  IOP:  Soft to palpation.  External/ Lids/ Lashes:  Normal  Anterior Segment:  Conjunctiva:  Normal  OU  Cornea:  Normal  OU  Anterior Chamber: Normal  OU  Lens:   2 NS, cortical spokes OU  Posterior Segment: Dilated OU with 1% Tropicamide and 2.5% Phenylephrine  Discs:   0.75 c/d ratio, no pallor, no edema OU  Macula:  Solitary cotton wool spot right inferotemporal macula,   cotton wool spots in the nasal macula in the left eye.  Vessels/ Periphery: Normal    Assessment/Plan: 1.  Ischemic Optic neuropathy, left eye.  Likely related to intraoperative hypovolemia/hypoperfusion of left optic nerve.  Counseled patient that he is unlikely to recover significant vision in the left eye.  Recommend outpatient followup with additional testing available in clinic, as scheduled this month with Dr. Cephus Shelling.  Patient to arrange with phone call after discharge.  2.  Glaucoma, primary open angle.  Continue present management.  Intraocular pressure has been well controlled on Latanoprost both eyes one drop at night and dorzolamide/timolol in both eyes twice  daily.  3.  Diabetes with diabetic retinopathy.  Continue glucose control.  4.  Cataracts.  Mild.  Visually significant, but not the primary cause of vision loss.  Patient may benefit from elective cataract surgery in the future when acute issues have resolved.    Benay Pillow 01/14/2017, 7:54 PM

## 2017-01-14 NOTE — Progress Notes (Signed)
Per Dr. Pat Patrick okay to have SCD's on.

## 2017-01-15 LAB — GLUCOSE, CAPILLARY
GLUCOSE-CAPILLARY: 126 mg/dL — AB (ref 65–99)
Glucose-Capillary: 125 mg/dL — ABNORMAL HIGH (ref 65–99)
Glucose-Capillary: 230 mg/dL — ABNORMAL HIGH (ref 65–99)
Glucose-Capillary: 149 mg/dL — ABNORMAL HIGH (ref 65–99)
Glucose-Capillary: 114 mg/dL — ABNORMAL HIGH (ref 65–99)

## 2017-01-15 LAB — CREATININE, SERUM
CREATININE: 0.56 mg/dL — AB (ref 0.61–1.24)
GFR calc non Af Amer: >60 mL/min (ref >60)

## 2017-01-15 MED ORDER — TIMOLOL MALEATE 0.5 % OP SOLN: 1.0000 [drp] | Freq: Two times a day (BID) | OPHTHALMIC | 12 refills | Status: DC

## 2017-01-15 MED ORDER — ASPIRIN 81 MG PO TBEC: 81.0000 mg | DELAYED_RELEASE_TABLET | Freq: Every day | ORAL | 5 refills | Status: DC

## 2017-01-15 MED ORDER — DORZOLAMIDE HCL 2 % OP SOLN: 1.0000 [drp] | Freq: Two times a day (BID) | OPHTHALMIC | 12 refills | Status: DC

## 2017-01-15 MED ORDER — OXYCODONE HCL 10 MG PO TABS: 10.0000 mg | ORAL_TABLET | ORAL | 0 refills | Status: DC | PRN

## 2017-01-15 MED ORDER — GUAIFENESIN ER 600 MG PO TB12: 600.0000 mg | ORAL_TABLET | Freq: Two times a day (BID) | ORAL | 1 refills | Status: DC

## 2017-01-15 NOTE — Care Management (Signed)
Patient to discharge home today. Home health order for RN and PT.  [place] from [facility] notified of discharge plan.  RNCM signing off

## 2017-01-15 NOTE — Care Management Important Message (Signed)
Important Message  Patient Details  Name: [name] MRN: [id number] Date of Birth: 1945-06-02   Medicare Important Message Given:  Yes    Beverly Sessions, RN 01/15/2017, 3:12 PM

## 2017-01-15 NOTE — Progress Notes (Signed)
Patient discharged to home prescription given dressing changed and iv site removed

## 2017-01-15 NOTE — Discharge Instructions (Signed)
Wound Dehiscence Wound dehiscence is when a surgical cut (incision) opens up and does not heal properly. It usually happens 7-10 days after surgery. You may have bleeding from the cut. You may also have pain or a fever. This condition should be treated early. HOME CARE  Only take medicines as told by your doctor.  Take your antibiotic medicine as told. Finish it even if you start to feel better.  Wash your wound with warm, soapy water 2 times a day, or as told. Pat the wound dry. Do not rub the wound.  Change bandages (dressings) as often as told. Wash your hands before and after changing bandages. Apply bandages as told.  Take showers. Do not soak the wound, bathe, swim, or use a hot tub until directed by your doctor.  Avoid exercises that make you sweat.  Use medicines that stop itching as told by your doctor. The wound may itch as it heals. Do not pick or scratch at the wound.  Do not lift more than 10 pounds (4.5 kilograms) until the wound is healed, or as told by your doctor.  Keep all doctor visits as told. GET HELP IF:  You have a lot of bleeding from your surgical cut.  Your wound does not seem to be healing right.  You have a fever. GET HELP RIGHT AWAY IF:  You have more puffiness (swelling) or redness around the wound.  You have more pain in the wound.  You have yellowish white fluid (pus) coming from the wound.  More of the wound breaks open. MAKE SURE YOU:  Understand these instructions.  Will watch your condition.  Will get help right away if you are not doing well or get worse. This information is not intended to replace advice given to you by your health care provider. Make sure you discuss any questions you have with your health care provider. Document Released: 12/05/2009 Document Revised: 01/07/2015 Document Reviewed: 08/24/2013 Elsevier Interactive Patient Education  2017 [employer].

## 2017-01-16 NOTE — Discharge Summary (Signed)
Physician Discharge Summary  Patient ID: Evan [name] MRN: [id number] DOB/AGE: 1945-03-21 72 y.o.  Admit date: 01/07/2017 Discharge date:  01/15/2017  Admission Diagnoses: Evisceration of bowel   Discharge Diagnoses:  Active Problems:   Evisceration of bowel   Discharged Condition: good  Hospital Course: 72 yr old male who had evisceration of bowel and underwent emergent operation with placement of retention sutures.  He has been up and moving around with PT who recommends home health PT.  I have also set up home health RN to visit for dry dressing to wound with retention sutures and penrose drain.  Opthomology saw the patient and will follow as outpatient.  DvT in leg, vascular saw, recommended ASA and will f/u in office next week as well.   Consults: None  Significant Diagnostic Studies:   Treatments: surgery: Ex lap with placement of retention sutures  Discharge Exam: Blood pressure 110/79, pulse 80, temperature 98.7 F (37.1 C), temperature source Temporal, resp. rate 20, height 6\' 3"  (1.905 m), weight (!) 302 lb (137 kg), SpO2 99 %. General appearance: alert and no distress GI: obese, midline incision clean with staples and retention sutures in place and penrose drian in, minimal serous drainage Extremities: extremities normal, atraumatic, no cyanosis or edema  Disposition: 01-Home or Self Care  Discharge Instructions    Call MD for:  difficulty breathing, headache or visual disturbances    Complete by:  As directed    Call MD for:  persistant nausea and vomiting    Complete by:  As directed    Call MD for:  redness, tenderness, or signs of infection (pain, swelling, redness, odor or green/yellow discharge around incision site)    Complete by:  As directed    Call MD for:  severe uncontrolled pain    Complete by:  As directed    Call MD for:  temperature >100.4    Complete by:  As directed    Diet - low sodium heart healthy    Complete by:  As directed    Discharge  wound care:    Complete by:  As directed    Dry dressing to midline wound daily, leave penrose drain and retention sutures in place   Driving Restrictions    Complete by:  As directed    No driving while on prescription pain medication   Increase activity slowly    Complete by:  As directed    Lifting restrictions    Complete by:  As directed    No lifting over 15lbs for 8 weeks     Allergies as of 01/15/2017      Reactions   Azithromycin Rash   Penicillins Rash   Has patient had a PCN reaction causing immediate rash, facial/tongue/throat swelling, SOB or lightheadedness with hypotension: Yes Has patient had a PCN reaction causing severe rash involving mucus membranes or skin necrosis: Yes Has patient had a PCN reaction that required hospitalization No Has patient had a PCN reaction occurring within the last 10 years: No If all of the above answers are "NO", then may proceed with Cephalosporin use.   Lisinopril Cough   Losartan Cough   Metoprolol Other (See Comments)   Bradycardia.      Medication List    TAKE these medications   acetaminophen 500 MG tablet Commonly known as:  TYLENOL Take 1,000 mg by mouth every 6 (six) hours as needed.   amLODipine 5 MG tablet Commonly known as:  NORVASC Take 5 mg by mouth daily.  aspirin 81 MG EC tablet Take 1 tablet (81 mg total) by mouth daily.   docusate calcium 240 MG capsule Commonly known as:  SURFAK Take 1 capsule by mouth daily.   dorzolamide 2 % ophthalmic solution Commonly known as:  TRUSOPT Place 1 drop into both eyes 2 (two) times daily.   dorzolamide-timolol 22.3-6.8 MG/ML ophthalmic solution Commonly known as:  COSOPT Apply 1 drop to eye 2 (two) times daily.   feeding supplement Liqd Take 1 Container by mouth 3 (three) times daily between meals.   ferrous gluconate 324 MG tablet Commonly known as:  FERGON Take 1 tablet (324 mg total) by mouth 2 (two) times daily with a meal.   Fish Oil 1200 MG Caps Take 2  capsules by mouth daily.   guaiFENesin 600 MG 12 hr tablet Commonly known as:  MUCINEX Take 1 tablet (600 mg total) by mouth 2 (two) times daily.   latanoprost 0.005 % ophthalmic solution Commonly known as:  XALATAN Apply 1 drop to eye at bedtime.   metFORMIN 1000 MG tablet Commonly known as:  GLUCOPHAGE Take 1,000 mg by mouth daily with breakfast.   MULTIVITAMIN ADULT PO Take 1 tablet by mouth daily.   Oxycodone HCl 10 MG Tabs Take 1 tablet (10 mg total) by mouth every 3 (three) hours as needed for moderate pain.   pravastatin 40 MG tablet Commonly known as:  PRAVACHOL Take 40 mg by mouth daily.   timolol 0.5 % ophthalmic solution Commonly known as:  TIMOPTIC Place 1 drop into both eyes 2 (two) times daily.   Vitamin D3 2000 units capsule Take 2,000 Units by mouth daily.      Follow-up Information    Leotis Pain, MD. Go on 01/22/2017.   Specialties:  Vascular Surgery, Radiology, Interventional Cardiology Why:  Seen as a consult in hospital. Patient needs bilateral lower extremity DVT studies. Go at 1:15pm  Contact information: [place] [id number]        Ronnell Freshwater, MD. Go on 01/16/2017.   Specialty:  Ophthalmology Why:  Dr. Cephus Shelling Wednesday, 1/17 at 10:15 a.m.  [phone] Contact information: [place] [address] [phone]        Olean Ree, MD. Go on 01/21/2017.   Specialty:  Surgery Why:  Monday, 1/22 at 10:45 a.m. [phone] Contact information: [place] [address] [address] [phone]           Signed: Hubbard Robinson 01/16/2017, 6:07 PM

## 2017-01-18 ENCOUNTER — Other Ambulatory Visit: Payer: Self-pay

## 2017-01-21 ENCOUNTER — Encounter: Payer: Self-pay | Admitting: Surgery

## 2017-01-21 ENCOUNTER — Ambulatory Visit (INDEPENDENT_AMBULATORY_CARE_PROVIDER_SITE_OTHER): Payer: Medicare Other | Admitting: Surgery

## 2017-01-21 VITALS — BP 143/83 | HR 86 | Temp 98.4°F | Ht 75.0 in | Wt 278.2 lb

## 2017-01-21 DIAGNOSIS — T8131XD Disruption of external operation (surgical) wound, not elsewhere classified, subsequent encounter: Secondary | ICD-10-CM

## 2017-01-21 DIAGNOSIS — Z9049 Acquired absence of other specified parts of digestive tract: Secondary | ICD-10-CM

## 2017-01-21 NOTE — Patient Instructions (Signed)
We have removed the staples from your wound today. We have placed steri strips and these will begin to fall off within 7-10 days. You may shower, but be sure to pat dry the steri strips. Please call our office if you have questions or concerns,.

## 2017-01-21 NOTE — Progress Notes (Signed)
01/21/2017  HPI: Patient is status post sigmoidectomy for diverticular lower GI bleed on 99991111 which was complicated by subsequent fascial dehiscence with evisceration of small bowel which required take back to the operating room on 1/9 for abdominal wound closure. He was discharged to home on 1/16 with a Penrose drain within the wound and retention sutures. Patient reports that he's been doing well from the abdominal standpoint has been eating and ambulating better. There is minimal drainage around the Penrose drain and has not had any issues with the retention sutures.  Of note during his hospital stay the patient did have issues with blurriness of vision and ophthalmology was consulted and diagnosed with ischemic optic neuropathy of the left eye, likely due to hypovolemia or hypoperfusion given his lower GI bleeding episodes. He has a follow-up with Dr. Cephus Shelling further evaluation. 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 Given his lower GI bleed he was only started on aspirin 81 mg daily. He has a follow-up appointment with Dr. Lucky Cowboy tomorrow as well.  Vital signs: BP (!) 143/83   Pulse 86   Temp 98.4 F (36.9 C) (Oral)   Ht 6\' 3"  (1.905 m)   Wt 126.2 kg (278 lb 3.2 oz)   BMI 34.77 kg/m    Physical Exam: Constitutional: No acute distress Abdomen: Soft, nondistended, obese, nontender to palpation. Midline incision is clean dry and intact. Staples and retention sutures remain in place. Penrose drain still in place with no purulent drainage.  Assessment/Plan: 72 year old male status post abdominal wound closure with retention sutures from a fascial dehiscence and evisceration. He had had a prior sigmoidectomy for lower GI diverticular bleed  -Have removed the Penrose drain as well as the staples and change them for Steri-Strips. Retention sutures will remain in place until follow up in 2 more weeks  to allow the fascial closure to heal better. -From a surgical standpoint there is no further bleeding or bleeding risk from his prior surgery and there are no contraindications to anticoagulation for his DVT if need be. -Patient still has no heavy lifting restriction of more than 10-15 pounds until 2/6. He will follow-up with [place] in 2 weeks for further evaluation and removal of the retention sutures. At that point we'll discuss with the patient further about any lifting activities. -Patient may apply dry gauze dressing to the midline incision once daily and as needed.   Melvyn Neth, [place]

## 2017-01-22 ENCOUNTER — Ambulatory Visit (INDEPENDENT_AMBULATORY_CARE_PROVIDER_SITE_OTHER): Payer: Medicare Other | Admitting: Vascular Surgery

## 2017-01-22 ENCOUNTER — Encounter (INDEPENDENT_AMBULATORY_CARE_PROVIDER_SITE_OTHER): Payer: Self-pay | Admitting: Vascular Surgery

## 2017-01-22 VITALS — BP 139/79 | HR 84 | Resp 16 | Ht 75.25 in | Wt 278.0 lb

## 2017-01-22 DIAGNOSIS — E1121 Type 2 diabetes mellitus with diabetic nephropathy: Secondary | ICD-10-CM

## 2017-01-22 DIAGNOSIS — I10 Essential (primary) hypertension: Secondary | ICD-10-CM | POA: Diagnosis not present

## 2017-01-22 DIAGNOSIS — Z9049 Acquired absence of other specified parts of digestive tract: Secondary | ICD-10-CM | POA: Diagnosis not present

## 2017-01-22 DIAGNOSIS — I82441 Acute embolism and thrombosis of right tibial vein: Secondary | ICD-10-CM

## 2017-01-22 DIAGNOSIS — Z86718 Personal history of other venous thrombosis and embolism: Secondary | ICD-10-CM | POA: Insufficient documentation

## 2017-01-22 DIAGNOSIS — I82409 Acute embolism and thrombosis of unspecified deep veins of unspecified lower extremity: Secondary | ICD-10-CM | POA: Insufficient documentation

## 2017-01-22 NOTE — Assessment & Plan Note (Signed)
blood glucose control important in reducing the progression of atherosclerotic disease. Also, involved in wound healing. On appropriate medications.  

## 2017-01-22 NOTE — Progress Notes (Signed)
MRN : [id number]  Evan [name] is a 72 y.o. ([birth year]) male who presents with chief complaint of  Chief Complaint  Patient presents with  . New Patient (Initial Visit)  .  History of Present Illness: Patient returns today in follow up of A recent hospitalization with consultation. He was seen initially and then embolization procedure was performed for a GI bleed. His bleeding persisted and he subsequently underwent colectomy. His postoperative course was complicated by a secondary closure of his abdominal wall. With this, he developed some pain and swelling in his right leg. 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 He was started on aspirin as he was still anemic and full anticoagulation was felt to be risky at that time. His leg pain and swelling have improved. He is tolerating the aspirin without signs of bleeding.  Current Outpatient Prescriptions  Medication Sig Dispense Refill  . acetaminophen (TYLENOL) 500 MG tablet Take 1,000 mg by mouth every 6 (six) hours as needed.    [name] amLODipine (NORVASC) 5 MG tablet Take 5 mg by mouth daily.    [name] aspirin EC 81 MG EC tablet Take 1 tablet (81 mg total) by mouth daily. 30 tablet 5  . BAYER CONTOUR TEST test strip USE TO TEST BLOOD SUGAR QD UTD  3  . Cholecalciferol (VITAMIN D3) 2000 units capsule Take 2,000 Units by mouth daily.    [name] docusate calcium (SURFAK) 240 MG capsule Take 1 capsule by mouth daily.    . dorzolamide (TRUSOPT) 2 % ophthalmic solution Place 1 drop into both eyes 2 (two) times daily. 10 mL 12  . dorzolamide-timolol (COSOPT) 22.3-6.8 MG/ML ophthalmic solution Apply 1 drop to eye 2 (two) times daily.    . feeding supplement (BOOST / RESOURCE BREEZE) LIQD Take 1 Container by mouth 3 (three) times daily between meals. 90 Container 5  . ferrous gluconate (FERGON) 324 MG tablet Take 1 tablet (324 mg  total) by mouth 2 (two) times daily with a meal. 60 tablet 5  . latanoprost (XALATAN) 0.005 % ophthalmic solution Apply 1 drop to eye at bedtime.    . metFORMIN (GLUCOPHAGE) 1000 MG tablet Take 1,000 mg by mouth daily with breakfast.     . Multiple Vitamins-Minerals (MULTIVITAMIN ADULT PO) Take 1 tablet by mouth daily.    . Omega-3 Fatty Acids (FISH OIL) 1200 MG CAPS Take 2 capsules by mouth daily.    . pravastatin (PRAVACHOL) 40 MG tablet Take 40 mg by mouth daily.    . timolol (TIMOPTIC) 0.5 % ophthalmic solution Place 1 drop into both eyes 2 (two) times daily. 10 mL 12  . guaiFENesin (MUCINEX) 600 MG 12 hr tablet Take 1 tablet (600 mg total) by mouth 2 (two) times daily. (Patient not taking: Reported on 01/22/2017) 60 tablet 1  . oxyCODONE 10 MG TABS Take 1 tablet (10 mg total) by mouth every 3 (three) hours as needed for moderate pain. (Patient not taking: Reported on 01/22/2017) 30 tablet 0   No current facility-administered medications for this visit.     Past Medical History:  Diagnosis Date  . Atrial fibrillation ([place])   . BPH (benign prostatic hyperplasia)   . CHF (congestive heart failure) ([place])   . Diabetes mellitus without complication ([place])   . Diverticulosis   . Hyperlipidemia   . Hypertension   .  Sleep apnea     Past Surgical History:  Procedure Laterality Date  . ATRIAL ABLATION SURGERY    . CHOLECYSTECTOMY    . LAPAROTOMY  01/01/2017   Procedure: EXPLORATORY LAPAROTOMY;  Surgeon: Olean Ree, MD;  Location: [facility] OR[facility];  Service: General;;  . PARTIAL COLECTOMY N/A 01/01/2017   Procedure: PARTIAL COLECTOMY;  Surgeon: Olean Ree, MD;  Location: [facility];  Service: General;  Laterality: N/A;  . PERIPHERAL VASCULAR CATHETERIZATION N/A 12/28/2016   Procedure: Visceral Artery Intervention;  Surgeon: Algernon Huxley, MD;  Location: [facility];  Service: Cardiovascular;  Laterality: N/A;  . SECONDARY CLOSURE OF WOUND N/A 01/08/2017   Procedure: SECONDARY CLOSURE FOR  EVISCERATION;  Surgeon: Florene Glen, MD;  Location: [facility];  Service: General;  Laterality: N/A;  . TOTAL KNEE ARTHROPLASTY Right     Social History Social History  Substance Use Topics  . Smoking status: Former Smoker    Packs/day: 0.25    Years: 29.00    Types: Cigarettes    Quit date: 12/31/1993  . Smokeless tobacco: Never Used  . Alcohol use No    Family History Family History  Problem Relation Age of Onset  . Diabetes Sister   . Diabetes Brother   . Diabetes Sister   . Diabetes Brother     Allergies  Allergen Reactions  . Azithromycin Rash  . Penicillins Rash    Has patient had a PCN reaction causing immediate rash, facial/tongue/throat swelling, SOB or lightheadedness with hypotension: Yes Has patient had a PCN reaction causing severe rash involving mucus membranes or skin necrosis: Yes Has patient had a PCN reaction that required hospitalization No Has patient had a PCN reaction occurring within the last 10 years: No If all of the above answers are "NO", then may proceed with Cephalosporin use.   [name] Lisinopril Cough  . Losartan Cough  . Metoprolol Other (See Comments)    Bradycardia.     REVIEW OF SYSTEMS (Negative unless checked)  Constitutional: _0 Weight loss  _1 Fever  _2 Chills Cardiac: _3 Chest pain   _4 Chest pressure   _5 Palpitations   _6 Shortness of breath when laying flat   _7 Shortness of breath at rest   _8 Shortness of breath with exertion. Vascular:  _9 Pain in legs with walking   _10 Pain in legs at rest   _11 Pain in legs when laying flat   _12 Claudication   _13 Pain in feet when walking  _14 Pain in feet at rest  _15 Pain in feet when laying flat   _16 History of DVT   _17 Phlebitis   _18 Swelling in legs   _19 Varicose veins   _20 Non-healing ulcers Pulmonary:   _21 Uses home oxygen   _22 Productive cough   _23 Hemoptysis   _24 Wheeze  _25 COPD   _26 Asthma Neurologic:  _27 Dizziness  _28 Blackouts   _29 Seizures   _30 History of stroke   _31 History of TIA  _32 Aphasia   _33 Temporary  blindness   _34 Dysphagia   _35 Weakness or numbness in arms   _36 Weakness or numbness in legs Musculoskeletal:  _37 Arthritis   _38 Joint swelling   _39 Joint pain   _40 Low back pain Hematologic:  _41 Easy bruising  _42 Easy bleeding   _43 Hypercoagulable state   _44 Anemic   Gastrointestinal:  _45 Blood in stool   _46 Vomiting blood  _47 Gastroesophageal reflux/heartburn   _48 Abdominal pain Genitourinary:  _49 Chronic kidney disease   _50 Difficult urination  _51 Frequent urination  _52 Burning with urination   _53 Hematuria Skin:  _54 Rashes   _55 Ulcers   _56 Wounds Psychological:  _57 History of anxiety   _58  History of major depression.  Physical Examination  BP 139/79   Pulse 84   Resp 16   Ht 6' 3.25" (1.911 m)   Wt 278 lb (126.1 kg)   BMI 34.52 kg/m  Gen:  WD/WN, NAD Head: [place]/AT, No temporalis wasting. Ear/Nose/Throat: Hearing grossly intact, nares w/o erythema or drainage, trachea midline Eyes: Conjunctiva clear. Sclera non-icteric Neck: Supple.  No JVD.  Pulmonary:  Good air movement, no use of accessory muscles.  Cardiac: RRR, normal S1, S2 Vascular:  Vessel Right Left  Radial Palpable Palpable  Ulnar Palpable Palpable  Brachial Palpable Palpable  Carotid Palpable, without bruit Palpable, without bruit  Aorta Not palpable N/A  Femoral Palpable Palpable  Popliteal Palpable Palpable  PT Palpable Palpable  DP Palpable Palpable   Gastrointestinal: soft, non-tender/non-distended. Incision healing well. Musculoskeletal: M/S 5/5 throughout.  No deformity or atrophy. Minimal BLE edema. Neurologic: Sensation grossly intact in extremities.  Symmetrical.  Speech is fluent.  Psychiatric: Judgment intact, Mood & affect appropriate for pt's clinical situation. Dermatologic: No rashes or ulcers noted.  No cellulitis or open wounds. Lymph : No Cervical, Axillary, or Inguinal lymphadenopathy.      Labs Recent Results (from the past 2160 hour(s))  Comprehensive metabolic panel     Status: Abnormal   Collection Time:  12/27/16 10:04 AM  Result Value Ref Range   Sodium 135 135 - 145 mmol/L   Potassium 4.1 3.5 - 5.1 mmol/L   Chloride 102 101 - 111 mmol/L   CO2 26 22 - 32 mmol/L   Glucose, Bld 263 (H) 65 - 99 mg/dL   BUN 15 6 - 20 mg/dL   Creatinine, Ser 0.94 0.61 - 1.24 mg/dL   Calcium 9.7 8.9 - 10.3 mg/dL   Total Protein 7.1 6.5 - 8.1 g/dL   Albumin 3.9 3.5 - 5.0 g/dL   AST 20 15 - 41 U/L   ALT 21 17 - 63 U/L   Alkaline Phosphatase 71 38 - 126 U/L   Total Bilirubin 1.1 0.3 - 1.2 mg/dL   GFR calc non Af Amer >60 >60 mL/min   GFR calc Af Amer >60 >60 mL/min    Comment: (NOTE) The eGFR has been calculated using the CKD EPI equation. This calculation has not been validated in all clinical situations. eGFR's persistently <60 mL/min signify possible Chronic Kidney Disease.    Anion gap 7 5 - 15  CBC     Status: Abnormal   Collection Time: 12/27/16 10:04 AM  Result Value Ref Range   WBC 7.3 3.8 - 10.6 K/uL   RBC 5.38 4.40 - 5.90 MIL/uL   Hemoglobin 14.9 13.0 - 18.0 g/dL   HCT 45.5 40.0 - 52.0 %   MCV 84.6 80.0 - 100.0 fL   MCH 27.7 26.0 - 34.0 pg   MCHC 32.7 32.0 - 36.0 g/dL   RDW 14.6 (H) 11.5 - 14.5 %   Platelets 281 150 - 440 K/uL  Type and screen [place]     Status: None   Collection Time: 12/27/16 10:04 AM  Result Value Ref Range   ABO/RH(D) B POS    Antibody Screen NEG    Sample Expiration 12/30/2016    Unit Number [id number]    Blood Component Type RED CELLS,LR    Unit division 00    Status of Unit ISSUED,FINAL    Transfusion Status OK TO TRANSFUSE    Crossmatch Result COMPATIBLE    Unit Number [id number]    Blood Component Type RBC LR PHER2  Unit division 00    Status of Unit REL FROM [facility]    Transfusion Status OK TO TRANSFUSE    Crossmatch Result COMPATIBLE    Unit Number [id number]    Blood Component Type RED CELLS,LR    Unit division 00    Status of Unit ISSUED,FINAL    Transfusion Status OK TO TRANSFUSE    Crossmatch Result  Compatible    Unit Number [id number]    Blood Component Type RED CELLS,LR    Unit division 00    Status of Unit REL FROM [facility]    Transfusion Status OK TO TRANSFUSE    Crossmatch Result Compatible    Unit Number [id number]    Blood Component Type RBC LR PHER2    Unit division 00    Status of Unit ISSUED,FINAL    Transfusion Status OK TO TRANSFUSE    Crossmatch Result Compatible    Unit Number [id number]    Blood Component Type RED CELLS,LR    Unit division 00    Status of Unit REL FROM [facility]    Transfusion Status OK TO TRANSFUSE    Crossmatch Result Compatible    Unit Number [id number]    Blood Component Type RBC LR PHER2    Unit division 00    Status of Unit ISSUED,FINAL    Transfusion Status OK TO TRANSFUSE    Crossmatch Result Compatible    Unit Number [id number]    Blood Component Type RBC, LR IRR    Unit division 00    Status of Unit ISSUED,FINAL    Transfusion Status OK TO TRANSFUSE    Crossmatch Result Compatible    Unit Number [id number]    Blood Component Type RED CELLS,LR    Unit division 00    Status of Unit ISSUED,FINAL    Transfusion Status OK TO TRANSFUSE    Crossmatch Result Compatible   Troponin I     Status: None   Collection Time: 12/27/16 10:04 AM  Result Value Ref Range   Troponin I <0.03 <0.03 ng/mL  Hemoglobin     Status: Abnormal   Collection Time: 12/27/16 12:17 PM  Result Value Ref Range   Hemoglobin 12.0 (L) 13.0 - 18.0 g/dL  ABO/Rh     Status: None   Collection Time: 12/27/16 12:17 PM  Result Value Ref Range   ABO/RH(D) B POS   Prepare RBC     Status: None   Collection Time: 12/27/16 12:30 PM  Result Value Ref Range   Order Confirmation ORDER PROCESSED BY BLOOD BANK   Glucose, capillary     Status: Abnormal   Collection Time: 12/27/16  5:18 PM  Result Value Ref Range   Glucose-Capillary 155 (H) 65 - 99 mg/dL  Hemoglobin     Status: None   Collection Time: 12/27/16  6:23 PM  Result Value Ref Range   Hemoglobin  13.0 13.0 - 18.0 g/dL  Glucose, capillary     Status: Abnormal   Collection Time: 12/27/16  9:10 PM  Result Value Ref Range   Glucose-Capillary 222 (H) 65 - 99 mg/dL  MRSA PCR Screening     Status: None   Collection Time: 12/27/16  9:15 PM  Result Value Ref Range   MRSA by PCR NEGATIVE NEGATIVE    Comment:        The GeneXpert MRSA Assay (FDA approved for NASAL specimens only), is one component of a comprehensive MRSA colonization surveillance program. It is not intended to diagnose MRSA infection nor to guide  or monitor treatment for MRSA infections.   Basic metabolic panel     Status: Abnormal   Collection Time: 12/28/16  3:43 AM  Result Value Ref Range   Sodium 139 135 - 145 mmol/L   Potassium 4.4 3.5 - 5.1 mmol/L   Chloride 110 101 - 111 mmol/L   CO2 25 22 - 32 mmol/L   Glucose, Bld 222 (H) 65 - 99 mg/dL   BUN 16 6 - 20 mg/dL   Creatinine, Ser 0.85 0.61 - 1.24 mg/dL   Calcium 8.9 8.9 - 10.3 mg/dL   GFR calc non Af Amer >60 >60 mL/min   GFR calc Af Amer >60 >60 mL/min    Comment: (NOTE) The eGFR has been calculated using the CKD EPI equation. This calculation has not been validated in all clinical situations. eGFR's persistently <60 mL/min signify possible Chronic Kidney Disease.    Anion gap 4 (L) 5 - 15  CBC     Status: Abnormal   Collection Time: 12/28/16  3:43 AM  Result Value Ref Range   WBC 14.2 (H) 3.8 - 10.6 K/uL   RBC 3.82 (L) 4.40 - 5.90 MIL/uL   Hemoglobin 10.9 (L) 13.0 - 18.0 g/dL   HCT 32.5 (L) 40.0 - 52.0 %   MCV 85.0 80.0 - 100.0 fL   MCH 28.6 26.0 - 34.0 pg   MCHC 33.6 32.0 - 36.0 g/dL   RDW 14.4 11.5 - 14.5 %   Platelets 199 150 - 440 K/uL  Glucose, capillary     Status: Abnormal   Collection Time: 12/28/16  7:18 AM  Result Value Ref Range   Glucose-Capillary 174 (H) 65 - 99 mg/dL  Glucose, capillary     Status: Abnormal   Collection Time: 12/28/16 11:16 AM  Result Value Ref Range   Glucose-Capillary 179 (H) 65 - 99 mg/dL  CBC with  Differential/Platelet     Status: Abnormal   Collection Time: 12/28/16  3:50 PM  Result Value Ref Range   WBC 16.6 (H) 3.8 - 10.6 K/uL   RBC 2.80 (L) 4.40 - 5.90 MIL/uL   Hemoglobin 7.8 (L) 13.0 - 18.0 g/dL   HCT 24.2 (L) 40.0 - 52.0 %   MCV 86.5 80.0 - 100.0 fL   MCH 27.9 26.0 - 34.0 pg   MCHC 32.2 32.0 - 36.0 g/dL   RDW 14.3 11.5 - 14.5 %   Platelets 184 150 - 440 K/uL   Neutrophils Relative % 75 %   Neutro Abs 12.4 (H) 1.4 - 6.5 K/uL   Lymphocytes Relative 17 %   Lymphs Abs 2.9 1.0 - 3.6 K/uL   Monocytes Relative 8 %   Monocytes Absolute 1.3 (H) 0.2 - 1.0 K/uL   Eosinophils Relative 0 %   Eosinophils Absolute 0.0 0 - 0.7 K/uL   Basophils Relative 0 %   Basophils Absolute 0.0 0 - 0.1 K/uL  Lactic acid, plasma     Status: Abnormal   Collection Time: 12/28/16  3:50 PM  Result Value Ref Range   Lactic Acid, Venous 4.2 (HH) 0.5 - 1.9 mmol/L    Comment: CRITICAL RESULT CALLED TO, READ BACK BY AND VERIFIED WITH MYRA FLOWERS @ 1741 12/28/16 BY [facility]   Ammonia     Status: Abnormal   Collection Time: 12/28/16  3:50 PM  Result Value Ref Range   Ammonia 46 (H) 9 - 35 umol/L  Protime-INR     Status: Abnormal   Collection Time: 12/28/16  3:50 PM  Result  Value Ref Range   Prothrombin Time 15.7 (H) 11.4 - 15.2 seconds   INR 1.24   APTT     Status: None   Collection Time: 12/28/16  3:50 PM  Result Value Ref Range   aPTT 25 24 - 36 seconds  Glucose, capillary     Status: Abnormal   Collection Time: 12/28/16  5:08 PM  Result Value Ref Range   Glucose-Capillary 287 (H) 65 - 99 mg/dL  Prepare RBC     Status: None   Collection Time: 12/28/16  6:30 PM  Result Value Ref Range   Order Confirmation      2 UNITS TOTAL PER DR. Darvin Neighbours 12/28/16 1750 SJ[facility] ORDER PROCESSED BY BLOOD BANK  Glucose, capillary     Status: Abnormal   Collection Time: 12/28/16  9:22 PM  Result Value Ref Range   Glucose-Capillary 305 (H) 65 - 99 mg/dL   Comment 1 Notify RN   Glucose, capillary     Status: Abnormal    Collection Time: 12/28/16 10:18 PM  Result Value Ref Range   Glucose-Capillary 282 (H) 65 - 99 mg/dL   Comment 1 Notify RN   Glucose, capillary     Status: Abnormal   Collection Time: 12/28/16 11:41 PM  Result Value Ref Range   Glucose-Capillary 288 (H) 65 - 99 mg/dL   Comment 1 Notify RN   CBC     Status: Abnormal   Collection Time: 12/29/16  1:24 AM  Result Value Ref Range   WBC 24.7 (H) 3.8 - 10.6 K/uL   RBC 2.89 (L) 4.40 - 5.90 MIL/uL   Hemoglobin 8.2 (L) 13.0 - 18.0 g/dL   HCT 24.7 (L) 40.0 - 52.0 %   MCV 85.4 80.0 - 100.0 fL   MCH 28.2 26.0 - 34.0 pg   MCHC 33.0 32.0 - 36.0 g/dL   RDW 14.6 (H) 11.5 - 14.5 %   Platelets 144 (L) 150 - 440 K/uL  Lactic acid, plasma     Status: Abnormal   Collection Time: 12/29/16  1:24 AM  Result Value Ref Range   Lactic Acid, Venous 4.8 (HH) 0.5 - 1.9 mmol/L    Comment: CRITICAL RESULT CALLED TO, READ BACK BY AND VERIFIED WITH BRITTON LEE RUST CHESTER AT 0210 12/29/16.PMH  Prepare RBC     Status: None   Collection Time: 12/29/16  2:30 AM  Result Value Ref Range   Order Confirmation ORDER PROCESSED BY BLOOD BANK   Glucose, capillary     Status: Abnormal   Collection Time: 12/29/16  2:56 AM  Result Value Ref Range   Glucose-Capillary 257 (H) 65 - 99 mg/dL   Comment 1 Notify RN   Glucose, capillary     Status: Abnormal   Collection Time: 12/29/16  7:33 AM  Result Value Ref Range   Glucose-Capillary 190 (H) 65 - 99 mg/dL  Lactic acid, plasma     Status: Abnormal   Collection Time: 12/29/16 10:25 AM  Result Value Ref Range   Lactic Acid, Venous 2.9 (HH) 0.5 - 1.9 mmol/L    Comment: CRITICAL RESULT CALLED TO, READ BACK BY AND VERIFIED WITH KATHERINE CLAYTON @ 1107 12/29/16 BY [facility]   CULTURE, BLOOD (ROUTINE X 2) w Reflex to ID Panel     Status: None   Collection Time: 12/29/16 10:25 AM  Result Value Ref Range   Specimen Description BLOOD LEFT HAND    Special Requests BOTTLES DRAWN AEROBIC AND ANAEROBIC ANA9ML AER 8ML    Culture NO  GROWTH 5 DAYS    Report Status 01/03/2017 FINAL   Hemoglobin     Status: Abnormal   Collection Time: 12/29/16 10:27 AM  Result Value Ref Range   Hemoglobin 8.6 (L) 13.0 - 18.0 g/dL  CULTURE, BLOOD (ROUTINE X 2) w Reflex to ID Panel     Status: None   Collection Time: 12/29/16 10:46 AM  Result Value Ref Range   Specimen Description BLOOD RIGHT HAND    Special Requests      BOTTLES DRAWN AEROBIC AND ANAEROBIC  AER 5 ML ANA 4 ML   Culture NO GROWTH 5 DAYS    Report Status 01/03/2017 FINAL   Glucose, capillary     Status: Abnormal   Collection Time: 12/29/16 11:48 AM  Result Value Ref Range   Glucose-Capillary 154 (H) 65 - 99 mg/dL  Glucose, capillary     Status: Abnormal   Collection Time: 12/29/16  4:09 PM  Result Value Ref Range   Glucose-Capillary 129 (H) 65 - 99 mg/dL  Hemoglobin     Status: Abnormal   Collection Time: 12/29/16  8:25 PM  Result Value Ref Range   Hemoglobin 7.2 (L) 13.0 - 18.0 g/dL  Glucose, capillary     Status: Abnormal   Collection Time: 12/29/16  9:25 PM  Result Value Ref Range   Glucose-Capillary 102 (H) 65 - 99 mg/dL   Comment 1 Notify RN   Prepare RBC     Status: None   Collection Time: 12/29/16 10:00 PM  Result Value Ref Range   Order Confirmation ORDER PROCESSED BY BLOOD BANK   Glucose, capillary     Status: Abnormal   Collection Time: 12/30/16  2:40 AM  Result Value Ref Range   Glucose-Capillary 141 (H) 65 - 99 mg/dL   Comment 1 Notify RN    Comment 2 Document in Chart   Glucose, capillary     Status: Abnormal   Collection Time: 12/30/16  5:40 AM  Result Value Ref Range   Glucose-Capillary 122 (H) 65 - 99 mg/dL   Comment 1 Notify RN    Comment 2 Document in Chart   Basic metabolic panel     Status: Abnormal   Collection Time: 12/30/16  7:10 AM  Result Value Ref Range   Sodium 142 135 - 145 mmol/L    Comment: ELECTROLYTES REPEATED KBH   Potassium 4.1 3.5 - 5.1 mmol/L   Chloride 115 (H) 101 - 111 mmol/L   CO2 25 22 - 32 mmol/L   Glucose,  Bld 139 (H) 65 - 99 mg/dL   BUN 20 6 - 20 mg/dL   Creatinine, Ser 0.91 0.61 - 1.24 mg/dL   Calcium 8.3 (L) 8.9 - 10.3 mg/dL   GFR calc non Af Amer >60 >60 mL/min   GFR calc Af Amer >60 >60 mL/min    Comment: (NOTE) The eGFR has been calculated using the CKD EPI equation. This calculation has not been validated in all clinical situations. eGFR's persistently <60 mL/min signify possible Chronic Kidney Disease.    Anion gap 2 (L) 5 - 15  CBC     Status: Abnormal   Collection Time: 12/30/16  7:10 AM  Result Value Ref Range   WBC 31.4 (H) 3.8 - 10.6 K/uL   RBC 2.90 (L) 4.40 - 5.90 MIL/uL   Hemoglobin 8.1 (L) 13.0 - 18.0 g/dL   HCT 24.0 (L) 40.0 - 52.0 %   MCV 82.7 80.0 - 100.0 fL   MCH 28.0 26.0 - 34.0  pg   MCHC 33.9 32.0 - 36.0 g/dL   RDW 15.8 (H) 11.5 - 14.5 %   Platelets 101 (L) 150 - 440 K/uL  Glucose, capillary     Status: Abnormal   Collection Time: 12/30/16  7:24 AM  Result Value Ref Range   Glucose-Capillary 118 (H) 65 - 99 mg/dL   Comment 1 Notify RN    Comment 2 Document in Chart   Glucose, capillary     Status: Abnormal   Collection Time: 12/30/16 11:20 AM  Result Value Ref Range   Glucose-Capillary 126 (H) 65 - 99 mg/dL   Comment 1 Notify RN    Comment 2 Document in Chart   Glucose, capillary     Status: Abnormal   Collection Time: 12/30/16  4:15 PM  Result Value Ref Range   Glucose-Capillary 121 (H) 65 - 99 mg/dL   Comment 1 Notify RN    Comment 2 Document in Chart   Glucose, capillary     Status: Abnormal   Collection Time: 12/30/16  9:12 PM  Result Value Ref Range   Glucose-Capillary 115 (H) 65 - 99 mg/dL  Glucose, capillary     Status: Abnormal   Collection Time: 12/31/16  3:58 AM  Result Value Ref Range   Glucose-Capillary 130 (H) 65 - 99 mg/dL  CBC     Status: Abnormal   Collection Time: 12/31/16  5:20 AM  Result Value Ref Range   WBC 21.8 (H) 3.8 - 10.6 K/uL   RBC 2.46 (L) 4.40 - 5.90 MIL/uL   Hemoglobin 7.1 (L) 13.0 - 18.0 g/dL   HCT 20.9 (L)  40.0 - 52.0 %   MCV 85.0 80.0 - 100.0 fL   MCH 28.9 26.0 - 34.0 pg   MCHC 34.1 32.0 - 36.0 g/dL   RDW 15.7 (H) 11.5 - 14.5 %   Platelets 115 (L) 150 - 440 K/uL  Basic metabolic panel     Status: Abnormal   Collection Time: 12/31/16  5:20 AM  Result Value Ref Range   Sodium 141 135 - 145 mmol/L   Potassium 3.6 3.5 - 5.1 mmol/L   Chloride 111 101 - 111 mmol/L   CO2 24 22 - 32 mmol/L   Glucose, Bld 153 (H) 65 - 99 mg/dL   BUN 9 6 - 20 mg/dL   Creatinine, Ser 0.70 0.61 - 1.24 mg/dL   Calcium 8.1 (L) 8.9 - 10.3 mg/dL   GFR calc non Af Amer >60 >60 mL/min   GFR calc Af Amer >60 >60 mL/min    Comment: (NOTE) The eGFR has been calculated using the CKD EPI equation. This calculation has not been validated in all clinical situations. eGFR's persistently <60 mL/min signify possible Chronic Kidney Disease.    Anion gap 6 5 - 15  Glucose, capillary     Status: Abnormal   Collection Time: 12/31/16  5:23 AM  Result Value Ref Range   Glucose-Capillary 155 (H) 65 - 99 mg/dL  Glucose, capillary     Status: Abnormal   Collection Time: 12/31/16  7:50 AM  Result Value Ref Range   Glucose-Capillary 185 (H) 65 - 99 mg/dL  Prepare RBC     Status: None   Collection Time: 12/31/16  8:17 AM  Result Value Ref Range   Order Confirmation ORDER PROCESSED BY BLOOD BANK   Type and screen [place]     Status: None   Collection Time: 12/31/16  8:17 AM  Result Value Ref Range  ISSUE DATE / TIME 403474259563    Blood Product Unit Number [id number]    PRODUCT CODE [id number]    Unit Type and Rh 7300    Blood Product Expiration Date 416606301601    ISSUE DATE / TIME 093235573220    Blood Product Unit Number [id number]    PRODUCT CODE [id number]    Unit Type and Rh 1700    Blood Product Expiration Date 073710626948    ISSUE DATE / TIME 546270350093    Blood Product Unit Number [id number]    PRODUCT CODE [id number]    Unit Type and Rh 7300    Blood Product Expiration Date  510258527782    ISSUE DATE / TIME 423536144315    Blood Product Unit Number [id number]    PRODUCT CODE [id number]    Unit Type and Rh 5100    Blood Product Expiration Date 099833825053    ISSUE DATE / TIME 976734193790    Blood Product Unit Number [id number]    PRODUCT CODE Q6834H96    Unit Type and Rh 7300    Blood Product Expiration Date 222979892119    Blood Product Unit Number [id number]    Unit Type and Rh 7300    Blood Product Expiration Date 631497026378   Prepare RBC     Status: None   Collection Time: 12/31/16  8:17 AM  Result Value Ref Range   Order Confirmation ORDER PROCESSED BY BLOOD BANK   Glucose, capillary     Status: Abnormal   Collection Time: 12/31/16  8:52 AM  Result Value Ref Range   Glucose-Capillary 194 (H) 65 - 99 mg/dL  Hemoglobin     Status: Abnormal   Collection Time: 12/31/16  9:18 AM  Result Value Ref Range   Hemoglobin 6.0 (L) 13.0 - 18.0 g/dL  Glucose, capillary     Status: Abnormal   Collection Time: 12/31/16 11:29 AM  Result Value Ref Range   Glucose-Capillary 161 (H) 65 - 99 mg/dL  Glucose, capillary     Status: Abnormal   Collection Time: 12/31/16  4:15 PM  Result Value Ref Range   Glucose-Capillary 145 (H) 65 - 99 mg/dL  MRSA PCR Screening     Status: None   Collection Time: 12/31/16  4:16 PM  Result Value Ref Range   MRSA by PCR NEGATIVE NEGATIVE    Comment:        The GeneXpert MRSA Assay (FDA approved for NASAL specimens only), is one component of a comprehensive MRSA colonization surveillance program. It is not intended to diagnose MRSA infection nor to guide or monitor treatment for MRSA infections.   Hemoglobin     Status: Abnormal   Collection Time: 12/31/16  8:07 PM  Result Value Ref Range   Hemoglobin 8.4 (L) 13.0 - 18.0 g/dL    Comment: RESULT REPEATED AND VERIFIED  Glucose, capillary     Status: Abnormal   Collection Time: 12/31/16  8:24 PM  Result Value Ref Range   Glucose-Capillary 132 (H) 65 - 99 mg/dL     Comment 1 Notify RN   Glucose, capillary     Status: Abnormal   Collection Time: 12/31/16 11:42 PM  Result Value Ref Range   Glucose-Capillary 116 (H) 65 - 99 mg/dL  Glucose, capillary     Status: Abnormal   Collection Time: 01/01/17  4:21 AM  Result Value Ref Range   Glucose-Capillary 131 (H) 65 - 99 mg/dL  CBC     Status: Abnormal   Collection Time: 01/01/17  5:55  AM  Result Value Ref Range   WBC 21.1 (H) 3.8 - 10.6 K/uL   RBC 2.76 (L) 4.40 - 5.90 MIL/uL   Hemoglobin 8.0 (L) 13.0 - 18.0 g/dL   HCT 23.7 (L) 40.0 - 52.0 %   MCV 85.8 80.0 - 100.0 fL   MCH 29.1 26.0 - 34.0 pg   MCHC 33.9 32.0 - 36.0 g/dL   RDW 14.9 (H) 11.5 - 14.5 %   Platelets 120 (L) 150 - 440 K/uL  Basic metabolic panel     Status: Abnormal   Collection Time: 01/01/17  5:55 AM  Result Value Ref Range   Sodium 138 135 - 145 mmol/L   Potassium 3.1 (L) 3.5 - 5.1 mmol/L   Chloride 108 101 - 111 mmol/L   CO2 27 22 - 32 mmol/L   Glucose, Bld 134 (H) 65 - 99 mg/dL   BUN 6 6 - 20 mg/dL   Creatinine, Ser 0.73 0.61 - 1.24 mg/dL   Calcium 8.0 (L) 8.9 - 10.3 mg/dL   GFR calc non Af Amer >60 >60 mL/min   GFR calc Af Amer >60 >60 mL/min    Comment: (NOTE) The eGFR has been calculated using the CKD EPI equation. This calculation has not been validated in all clinical situations. eGFR's persistently <60 mL/min signify possible Chronic Kidney Disease.    Anion gap 3 (L) 5 - 15  Magnesium     Status: None   Collection Time: 01/01/17  5:55 AM  Result Value Ref Range   Magnesium 1.7 1.7 - 2.4 mg/dL  Protime-INR     Status: None   Collection Time: 01/01/17  5:55 AM  Result Value Ref Range   Prothrombin Time 15.1 11.4 - 15.2 seconds   INR 1.18   Glucose, capillary     Status: Abnormal   Collection Time: 01/01/17  7:10 AM  Result Value Ref Range   Glucose-Capillary 129 (H) 65 - 99 mg/dL  Prepare RBC (crossmatch)     Status: None   Collection Time: 01/01/17 12:30 PM  Result Value Ref Range   Order Confirmation  ORDER PROCESSED BY BLOOD BANK   Surgical pathology     Status: None   Collection Time: 01/01/17  1:12 PM  Result Value Ref Range   SURGICAL PATHOLOGY      Surgical Pathology CASE: [id number] PATIENT: Prudence Davidson Surgical Pathology Report     SPECIMEN SUBMITTED: A. Colon, sigmoid B. Colon, distal descending  CLINICAL HISTORY: None provided  PRE-OPERATIVE DIAGNOSIS: N/A  POST-OPERATIVE DIAGNOSIS: None provided.     DIAGNOSIS: A. SIGMOID COLON; COLECTOMY: - FEATURES CONSISTENT WITH ISCHEMIC COLITIS. - NEGATIVE FOR DYSPLASIA AND MALIGNANCY. - THREE BENIGN LYMPH NODES. - FOREIGN INTRAVASCULAR MATERIAL, CONSISTENT WITH PRIOR EMBOLIZATION.  B. DISTAL DESCENDING COLON; COLECTOMY: - NO PATHOLOGIC CHANGE.   GROSS DESCRIPTION:  A. Labeled: sigmoid colon  Parts of bowel received: sigmoid colon  Measurement: 14.3 cm long and 4.3 cm in diameter and 2 donuts, 2.0 x 1.5 x 0.5 cm and 2.3 x 2.0 x 1.1 cm  Specimen Integrity (Describe any perforations): colon segment received intact  Orientation: radial-orange and remaining-blue  External surface: serosal tan with focal ecchymosis or moderate amount at tached mesocolon  Other remarkable findings: opening the colon reveals denuded focally ulcerated mucosa with surrounding nodularity (6.0 x 4.1 cm) that extends to 0 1 stapled end  Lymph nodes: identified  Block Summary: 1-2-representative donuts 3-4-representative denuded mucosa 5-representative vessels near denuded mucosa and lymph node 6-10-additional representative sections  B. Labeled: distal descending colon  Parts of bowel received: descending colon  Measurement: 4.3 cm long and 3 cm in diameter  Specimen Integrity (Describe any perforations): one end open and one stapled  Orientation: not oriented  External surface: wrinkled pink mucosa with moderate attached mesocolon  Other remarkable findings: none noted  Lymph nodes: none  identified  Block Summary: 1-en face open margin 2-en face stapled margin   Final Diagnosis performed by Delorse Lek, MD.  Electronically signed 01/04/2017 12:33:54PM    The electronic signature indicates that the named  Attending Pathologist has evaluated the specimen  Technical component performed at [facility], [address] [place], [place] [address] Lab: [phone] Dir: Jakobi Thetford. Evette Doffing, MD  Professional component performed at [facility], [facility], [place], [place], [place] [address] Lab: [phone] Dir: Dellia Nims. Rubinas, MD    Glucose, capillary     Status: Abnormal   Collection Time: 01/01/17  3:11 PM  Result Value Ref Range   Glucose-Capillary 123 (H) 65 - 99 mg/dL  Glucose, capillary     Status: Abnormal   Collection Time: 01/01/17  7:36 PM  Result Value Ref Range   Glucose-Capillary 109 (H) 65 - 99 mg/dL  Glucose, capillary     Status: Abnormal   Collection Time: 01/01/17 11:54 PM  Result Value Ref Range   Glucose-Capillary 146 (H) 65 - 99 mg/dL  Glucose, capillary     Status: Abnormal   Collection Time: 01/02/17  4:05 AM  Result Value Ref Range   Glucose-Capillary 129 (H) 65 - 99 mg/dL  CBC with Differential/Platelet     Status: Abnormal   Collection Time: 01/02/17  4:38 AM  Result Value Ref Range   WBC 18.0 (H) 3.8 - 10.6 K/uL   RBC 2.84 (L) 4.40 - 5.90 MIL/uL   Hemoglobin 8.2 (L) 13.0 - 18.0 g/dL   HCT 24.4 (L) 40.0 - 52.0 %   MCV 86.0 80.0 - 100.0 fL   MCH 28.8 26.0 - 34.0 pg   MCHC 33.4 32.0 - 36.0 g/dL   RDW 15.1 (H) 11.5 - 14.5 %   Platelets 139 (L) 150 - 440 K/uL   Neutrophils Relative % 79 %   Lymphocytes Relative 9 %   Monocytes Relative 10 %   Eosinophils Relative 2 %   Basophils Relative 0 %   nRBC 7 (H) 0 /100 WBC   Neutro Abs 14.2 (H) 1.4 - 6.5 K/uL   Lymphs Abs 1.6 1.0 - 3.6 K/uL   Monocytes Absolute 1.8 (H) 0.2 - 1.0 K/uL   Eosinophils Absolute 0.4 0 - 0.7 K/uL   Basophils Absolute 0.0 0 - 0.1 K/uL   RBC  Morphology MIXED RBC POPULATION     Comment: POLYCHROMASIA PRESENT  Basic metabolic panel     Status: Abnormal   Collection Time: 01/02/17  4:38 AM  Result Value Ref Range   Sodium 141 135 - 145 mmol/L   Potassium 3.1 (L) 3.5 - 5.1 mmol/L   Chloride 109 101 - 111 mmol/L   CO2 29 22 - 32 mmol/L   Glucose, Bld 123 (H) 65 - 99 mg/dL   BUN <5 (L) 6 - 20 mg/dL   Creatinine, Ser 0.60 (L) 0.61 - 1.24 mg/dL   Calcium 7.8 (L) 8.9 - 10.3 mg/dL   GFR calc non Af Amer >60 >60 mL/min   GFR calc Af Amer >60 >60 mL/min    Comment: (NOTE) The eGFR has been calculated using the CKD EPI equation.  This calculation has not been validated in all clinical situations. eGFR's persistently <60 mL/min signify possible Chronic Kidney Disease.    Anion gap 3 (L) 5 - 15  Pathologist smear review     Status: None   Collection Time: 01/02/17  4:38 AM  Result Value Ref Range   Path Review      Blood smear reviewed. There is mild thrombocytopenia without clumping. There are numerous nucleated RBCs, and there is a prominent increase in polychromasia. Spherocytes are present. Neutrophils are increased with left shift including myelocytes. I note  the history of GI bleed, multiple transfusions, and partial colectomy. The hemoglobin appears stable at this time. The morphologic features suggest a component of hemolysis and raises concern for delayed hemolytic transfusion reaction. It would be  prudent to recheck a blood sample for unexpected antibodies and direct antiglobulin test.      Comment: Reviewed by Lemmie Evens. Dicie Beam, MD.  Glucose, capillary     Status: Abnormal   Collection Time: 01/02/17  7:44 AM  Result Value Ref Range   Glucose-Capillary 111 (H) 65 - 99 mg/dL  DAT, polyspecific, AHG ([facility] only)     Status: None   Collection Time: 01/02/17 10:31 AM  Result Value Ref Range   Polyspecific AHG test NEG   Type and screen Hold 2 units now and stay ahead 2 units at all times     Status: None   Collection Time:  01/02/17 10:31 AM  Result Value Ref Range   ABO/RH(D) B POS    Antibody Screen NEG    Sample Expiration 01/05/2017    Unit Number [id number]    Blood Component Type RED CELLS,LR    Unit division 00    Status of Unit REL FROM [facility]    Transfusion Status OK TO TRANSFUSE    Crossmatch Result Compatible    Unit Number [id number]    Blood Component Type RED CELLS,LR    Unit division 00    Status of Unit ISSUED,FINAL    Transfusion Status OK TO TRANSFUSE    Crossmatch Result Compatible    Unit Number [id number]    Blood Component Type RED CELLS,LR    Unit division 00    Status of Unit ISSUED,FINAL    Transfusion Status OK TO TRANSFUSE    Crossmatch Result Compatible   Glucose, capillary     Status: Abnormal   Collection Time: 01/02/17 11:16 AM  Result Value Ref Range   Glucose-Capillary 104 (H) 65 - 99 mg/dL  Glucose, capillary     Status: Abnormal   Collection Time: 01/02/17  4:03 PM  Result Value Ref Range   Glucose-Capillary 104 (H) 65 - 99 mg/dL  Glucose, capillary     Status: Abnormal   Collection Time: 01/02/17  8:03 PM  Result Value Ref Range   Glucose-Capillary 130 (H) 65 - 99 mg/dL  Glucose, capillary     Status: Abnormal   Collection Time: 01/02/17 11:46 PM  Result Value Ref Range   Glucose-Capillary 149 (H) 65 - 99 mg/dL  Glucose, capillary     Status: Abnormal   Collection Time: 01/03/17  3:34 AM  Result Value Ref Range   Glucose-Capillary 138 (H) 65 - 99 mg/dL  CBC with Differential/Platelet     Status: Abnormal   Collection Time: 01/03/17  4:41 AM  Result Value Ref Range   WBC 17.5 (H) 3.8 - 10.6 K/uL   RBC 2.15 (L) 4.40 - 5.90 MIL/uL   Hemoglobin 6.2 (L) 13.0 -  18.0 g/dL   HCT 18.9 (L) 40.0 - 52.0 %   MCV 87.7 80.0 - 100.0 fL   MCH 28.7 26.0 - 34.0 pg   MCHC 32.7 32.0 - 36.0 g/dL   RDW 15.1 (H) 11.5 - 14.5 %   Platelets 160 150 - 440 K/uL   Neutrophils Relative % 79 %   Lymphocytes Relative 17 %   Monocytes Relative 2 %   Eosinophils  Relative 2 %   Basophils Relative 0 %   Band Neutrophils 0 %   Metamyelocytes Relative 0 %   Myelocytes 0 %   Promyelocytes Absolute 0 %   Blasts 0 %   nRBC 14 (H) 0 /100 WBC   Other 0 %   Neutro Abs 13.7 (H) 1.4 - 6.5 K/uL   Lymphs Abs 3.0 1.0 - 3.6 K/uL   Monocytes Absolute 0.4 0.2 - 1.0 K/uL   Eosinophils Absolute 0.4 0 - 0.7 K/uL   Basophils Absolute 0.0 0 - 0.1 K/uL   RBC Morphology MIXED RBC POPULATION     Comment: HYPOCHROMIA POLYCHROMASIA PRESENT   Basic metabolic panel     Status: Abnormal   Collection Time: 01/03/17  4:41 AM  Result Value Ref Range   Sodium 140 135 - 145 mmol/L   Potassium 3.5 3.5 - 5.1 mmol/L   Chloride 108 101 - 111 mmol/L   CO2 27 22 - 32 mmol/L   Glucose, Bld 128 (H) 65 - 99 mg/dL   BUN 8 6 - 20 mg/dL   Creatinine, Ser 0.77 0.61 - 1.24 mg/dL   Calcium 7.7 (L) 8.9 - 10.3 mg/dL   GFR calc non Af Amer >60 >60 mL/min   GFR calc Af Amer >60 >60 mL/min    Comment: (NOTE) The eGFR has been calculated using the CKD EPI equation. This calculation has not been validated in all clinical situations. eGFR's persistently <60 mL/min signify possible Chronic Kidney Disease.    Anion gap 5 5 - 15  Prepare RBC     Status: None   Collection Time: 01/03/17  7:00 AM  Result Value Ref Range   Order Confirmation ORDER PROCESSED BY BLOOD BANK   Glucose, capillary     Status: Abnormal   Collection Time: 01/03/17  7:45 AM  Result Value Ref Range   Glucose-Capillary 104 (H) 65 - 99 mg/dL  Glucose, capillary     Status: Abnormal   Collection Time: 01/03/17 11:55 AM  Result Value Ref Range   Glucose-Capillary 158 (H) 65 - 99 mg/dL  Glucose, capillary     Status: Abnormal   Collection Time: 01/03/17  5:12 PM  Result Value Ref Range   Glucose-Capillary 137 (H) 65 - 99 mg/dL  Hemoglobin and hematocrit, blood     Status: Abnormal   Collection Time: 01/03/17  6:34 PM  Result Value Ref Range   Hemoglobin 7.7 (L) 13.0 - 18.0 g/dL   HCT 23.2 (L) 40.0 - 52.0 %   Glucose, capillary     Status: Abnormal   Collection Time: 01/03/17  7:40 PM  Result Value Ref Range   Glucose-Capillary 162 (H) 65 - 99 mg/dL  Hemoglobin and hematocrit, blood     Status: Abnormal   Collection Time: 01/03/17 11:35 PM  Result Value Ref Range   Hemoglobin 7.3 (L) 13.0 - 18.0 g/dL   HCT 21.8 (L) 40.0 - 52.0 %  Glucose, capillary     Status: Abnormal   Collection Time: 01/03/17 11:45 PM  Result Value Ref Range  Glucose-Capillary 157 (H) 65 - 99 mg/dL  Glucose, capillary     Status: Abnormal   Collection Time: 01/04/17  4:28 AM  Result Value Ref Range   Glucose-Capillary 121 (H) 65 - 99 mg/dL  CBC with Differential/Platelet     Status: Abnormal   Collection Time: 01/04/17  4:29 AM  Result Value Ref Range   WBC 15.8 (H) 3.8 - 10.6 K/uL   RBC 2.68 (L) 4.40 - 5.90 MIL/uL   Hemoglobin 7.8 (L) 13.0 - 18.0 g/dL   HCT 23.1 (L) 40.0 - 52.0 %   MCV 86.3 80.0 - 100.0 fL   MCH 29.0 26.0 - 34.0 pg   MCHC 33.6 32.0 - 36.0 g/dL   RDW 16.5 (H) 11.5 - 14.5 %   Platelets 192 150 - 440 K/uL   Neutrophils Relative % 81 %   Lymphocytes Relative 10 %   Monocytes Relative 5 %   Eosinophils Relative 0 %   Basophils Relative 0 %   Band Neutrophils 1 %   Metamyelocytes Relative 3 %   Myelocytes 0 %   Promyelocytes Absolute 0 %   Blasts 0 %   nRBC 11 (H) 0 /100 WBC   Other 0 %   Neutro Abs 13.4 (H) 1.4 - 6.5 K/uL   Lymphs Abs 1.6 1.0 - 3.6 K/uL   Monocytes Absolute 0.8 0.2 - 1.0 K/uL   Eosinophils Absolute 0.0 0 - 0.7 K/uL   Basophils Absolute 0.0 0 - 0.1 K/uL   RBC Morphology POLYCHROMASIA PRESENT     Comment: MIXED RBC POPULATION TARGET CELLS SPHEROCYTES   Basic metabolic panel     Status: Abnormal   Collection Time: 01/04/17  4:29 AM  Result Value Ref Range   Sodium 139 135 - 145 mmol/L   Potassium 3.8 3.5 - 5.1 mmol/L    Comment: HEMOLYSIS AT THIS LEVEL MAY AFFECT RESULT   Chloride 108 101 - 111 mmol/L   CO2 27 22 - 32 mmol/L   Glucose, Bld 105 (H) 65 - 99 mg/dL    BUN 8 6 - 20 mg/dL   Creatinine, Ser 0.65 0.61 - 1.24 mg/dL   Calcium 7.8 (L) 8.9 - 10.3 mg/dL   GFR calc non Af Amer >60 >60 mL/min   GFR calc Af Amer >60 >60 mL/min    Comment: (NOTE) The eGFR has been calculated using the CKD EPI equation. This calculation has not been validated in all clinical situations. eGFR's persistently <60 mL/min signify possible Chronic Kidney Disease.    Anion gap 4 (L) 5 - 15  Magnesium     Status: None   Collection Time: 01/04/17  4:29 AM  Result Value Ref Range   Magnesium 1.8 1.7 - 2.4 mg/dL  Glucose, capillary     Status: Abnormal   Collection Time: 01/04/17  7:49 AM  Result Value Ref Range   Glucose-Capillary 129 (H) 65 - 99 mg/dL   Comment 1 Notify RN   Glucose, capillary     Status: Abnormal   Collection Time: 01/04/17 12:16 PM  Result Value Ref Range   Glucose-Capillary 200 (H) 65 - 99 mg/dL   Comment 1 Notify RN   Glucose, capillary     Status: Abnormal   Collection Time: 01/04/17  4:12 PM  Result Value Ref Range   Glucose-Capillary 143 (H) 65 - 99 mg/dL  Glucose, capillary     Status: Abnormal   Collection Time: 01/04/17  7:58 PM  Result Value Ref Range   Glucose-Capillary  132 (H) 65 - 99 mg/dL  Glucose, capillary     Status: Abnormal   Collection Time: 01/05/17 12:04 AM  Result Value Ref Range   Glucose-Capillary 132 (H) 65 - 99 mg/dL   Comment 1 Notify RN   Glucose, capillary     Status: Abnormal   Collection Time: 01/05/17  4:46 AM  Result Value Ref Range   Glucose-Capillary 126 (H) 65 - 99 mg/dL  CBC     Status: Abnormal   Collection Time: 01/05/17  5:27 AM  Result Value Ref Range   WBC 12.1 (H) 3.8 - 10.6 K/uL   RBC 2.46 (L) 4.40 - 5.90 MIL/uL   Hemoglobin 7.0 (L) 13.0 - 18.0 g/dL   HCT 21.3 (L) 40.0 - 52.0 %   MCV 86.4 80.0 - 100.0 fL   MCH 28.3 26.0 - 34.0 pg   MCHC 32.8 32.0 - 36.0 g/dL   RDW 15.9 (H) 11.5 - 14.5 %   Platelets 219 150 - 440 K/uL  Basic metabolic panel     Status: Abnormal   Collection Time:  01/05/17  5:27 AM  Result Value Ref Range   Sodium 138 135 - 145 mmol/L   Potassium 3.2 (L) 3.5 - 5.1 mmol/L   Chloride 105 101 - 111 mmol/L   CO2 28 22 - 32 mmol/L   Glucose, Bld 124 (H) 65 - 99 mg/dL   BUN 7 6 - 20 mg/dL   Creatinine, Ser 0.55 (L) 0.61 - 1.24 mg/dL   Calcium 8.0 (L) 8.9 - 10.3 mg/dL   GFR calc non Af Amer >60 >60 mL/min   GFR calc Af Amer >60 >60 mL/min    Comment: (NOTE) The eGFR has been calculated using the CKD EPI equation. This calculation has not been validated in all clinical situations. eGFR's persistently <60 mL/min signify possible Chronic Kidney Disease.    Anion gap 5 5 - 15  Glucose, capillary     Status: Abnormal   Collection Time: 01/05/17  7:47 AM  Result Value Ref Range   Glucose-Capillary 112 (H) 65 - 99 mg/dL  Glucose, capillary     Status: Abnormal   Collection Time: 01/05/17 11:31 AM  Result Value Ref Range   Glucose-Capillary 227 (H) 65 - 99 mg/dL  Glucose, capillary     Status: Abnormal   Collection Time: 01/05/17  4:38 PM  Result Value Ref Range   Glucose-Capillary 151 (H) 65 - 99 mg/dL  Glucose, capillary     Status: Abnormal   Collection Time: 01/05/17  8:52 PM  Result Value Ref Range   Glucose-Capillary 160 (H) 65 - 99 mg/dL   Comment 1 Notify RN   Glucose, capillary     Status: Abnormal   Collection Time: 01/06/17 12:20 AM  Result Value Ref Range   Glucose-Capillary 192 (H) 65 - 99 mg/dL   Comment 1 Notify RN   Glucose, capillary     Status: Abnormal   Collection Time: 01/06/17  4:46 AM  Result Value Ref Range   Glucose-Capillary 125 (H) 65 - 99 mg/dL  CBC     Status: Abnormal   Collection Time: 01/06/17  6:06 AM  Result Value Ref Range   WBC 9.8 3.8 - 10.6 K/uL   RBC 2.50 (L) 4.40 - 5.90 MIL/uL   Hemoglobin 7.2 (L) 13.0 - 18.0 g/dL   HCT 21.6 (L) 40.0 - 52.0 %   MCV 86.1 80.0 - 100.0 fL   MCH 28.6 26.0 - 34.0 pg  MCHC 33.2 32.0 - 36.0 g/dL   RDW 16.3 (H) 11.5 - 14.5 %   Platelets 261 150 - 440 K/uL  Glucose,  capillary     Status: Abnormal   Collection Time: 01/06/17  7:39 AM  Result Value Ref Range   Glucose-Capillary 124 (H) 65 - 99 mg/dL  Glucose, capillary     Status: Abnormal   Collection Time: 01/06/17 11:37 AM  Result Value Ref Range   Glucose-Capillary 215 (H) 65 - 99 mg/dL  Urinalysis, Complete w Microscopic     Status: Abnormal   Collection Time: 01/07/17 12:36 AM  Result Value Ref Range   Color, Urine STRAW (A) YELLOW   APPearance CLEAR (A) CLEAR   Specific Gravity, Urine 1.003 (L) 1.005 - 1.030   pH 8.0 5.0 - 8.0   Glucose, UA >=500 (A) NEGATIVE mg/dL   Hgb urine dipstick NEGATIVE NEGATIVE   Bilirubin Urine NEGATIVE NEGATIVE   Ketones, ur NEGATIVE NEGATIVE mg/dL   Protein, ur 30 (A) NEGATIVE mg/dL   Nitrite NEGATIVE NEGATIVE   Leukocytes, UA NEGATIVE NEGATIVE   RBC / HPF 0-5 0 - 5 RBC/hpf   WBC, UA 0-5 0 - 5 WBC/hpf   Bacteria, UA RARE (A) NONE SEEN   Squamous Epithelial / LPF NONE SEEN NONE SEEN   Mucous PRESENT   Osmolality, urine     Status: Abnormal   Collection Time: 01/07/17 12:36 AM  Result Value Ref Range   Osmolality, Ur 243 (L) 300 - 900 mOsm/kg  CBC with Differential/Platelet     Status: Abnormal   Collection Time: 01/08/17  1:02 AM  Result Value Ref Range   WBC 14.0 (H) 3.8 - 10.6 K/uL   RBC 2.81 (L) 4.40 - 5.90 MIL/uL   Hemoglobin 7.7 (L) 13.0 - 18.0 g/dL   HCT 24.0 (L) 40.0 - 52.0 %   MCV 85.5 80.0 - 100.0 fL   MCH 27.4 26.0 - 34.0 pg   MCHC 32.0 32.0 - 36.0 g/dL   RDW 16.9 (H) 11.5 - 14.5 %   Platelets 397 150 - 440 K/uL   Neutrophils Relative % 82 %   Lymphocytes Relative 10 %   Monocytes Relative 6 %   Eosinophils Relative 2 %   Basophils Relative 0 %   Band Neutrophils 0 %   Metamyelocytes Relative 0 %   Myelocytes 0 %   Promyelocytes Absolute 0 %   Blasts 0 %   nRBC 4 (H) 0 /100 WBC   Other 0 %   Neutro Abs 11.5 (H) 1.4 - 6.5 K/uL   Lymphs Abs 1.4 1.0 - 3.6 K/uL   Monocytes Absolute 0.8 0.2 - 1.0 K/uL   Eosinophils Absolute 0.3 0 -  0.7 K/uL   Basophils Absolute 0.0 0 - 0.1 K/uL   RBC Morphology MIXED RBC POPULATION     Comment: HYPOCHROMIA MARKED POIKILOCYTOSIS   Comprehensive metabolic panel     Status: Abnormal   Collection Time: 01/08/17  1:02 AM  Result Value Ref Range   Sodium 138 135 - 145 mmol/L   Potassium 3.7 3.5 - 5.1 mmol/L   Chloride 103 101 - 111 mmol/L   CO2 28 22 - 32 mmol/L   Glucose, Bld 199 (H) 65 - 99 mg/dL   BUN 5 (L) 6 - 20 mg/dL   Creatinine, Ser 0.72 0.61 - 1.24 mg/dL   Calcium 9.1 8.9 - 10.3 mg/dL   Total Protein 5.5 (L) 6.5 - 8.1 g/dL   Albumin 2.6 (L) 3.5 -  5.0 g/dL   AST 26 15 - 41 U/L   ALT 17 17 - 63 U/L   Alkaline Phosphatase 55 38 - 126 U/L   Total Bilirubin 0.7 0.3 - 1.2 mg/dL   GFR calc non Af Amer >60 >60 mL/min   GFR calc Af Amer >60 >60 mL/min    Comment: (NOTE) The eGFR has been calculated using the CKD EPI equation. This calculation has not been validated in all clinical situations. eGFR's persistently <60 mL/min signify possible Chronic Kidney Disease.    Anion gap 7 5 - 15  Protime-INR     Status: None   Collection Time: 01/08/17  1:02 AM  Result Value Ref Range   Prothrombin Time 14.3 11.4 - 15.2 seconds   INR 1.11   Lipase, blood     Status: None   Collection Time: 01/08/17  1:02 AM  Result Value Ref Range   Lipase 48 11 - 51 U/L  Comprehensive metabolic panel     Status: Abnormal   Collection Time: 01/08/17  5:03 AM  Result Value Ref Range   Sodium 140 135 - 145 mmol/L   Potassium 3.7 3.5 - 5.1 mmol/L   Chloride 104 101 - 111 mmol/L   CO2 31 22 - 32 mmol/L   Glucose, Bld 161 (H) 65 - 99 mg/dL   BUN <5 (L) 6 - 20 mg/dL   Creatinine, Ser 0.75 0.61 - 1.24 mg/dL   Calcium 8.8 (L) 8.9 - 10.3 mg/dL   Total Protein 5.3 (L) 6.5 - 8.1 g/dL   Albumin 2.6 (L) 3.5 - 5.0 g/dL   AST 21 15 - 41 U/L   ALT 18 17 - 63 U/L   Alkaline Phosphatase 59 38 - 126 U/L   Total Bilirubin 0.8 0.3 - 1.2 mg/dL   GFR calc non Af Amer >60 >60 mL/min   GFR calc Af Amer >60 >60  mL/min    Comment: (NOTE) The eGFR has been calculated using the CKD EPI equation. This calculation has not been validated in all clinical situations. eGFR's persistently <60 mL/min signify possible Chronic Kidney Disease.    Anion gap 5 5 - 15  CBC     Status: Abnormal   Collection Time: 01/08/17  5:03 AM  Result Value Ref Range   WBC 14.8 (H) 3.8 - 10.6 K/uL   RBC 2.92 (L) 4.40 - 5.90 MIL/uL   Hemoglobin 7.9 (L) 13.0 - 18.0 g/dL   HCT 25.0 (L) 40.0 - 52.0 %   MCV 85.6 80.0 - 100.0 fL   MCH 27.2 26.0 - 34.0 pg   MCHC 31.8 (L) 32.0 - 36.0 g/dL   RDW 16.9 (H) 11.5 - 14.5 %   Platelets 360 150 - 440 K/uL  Urinalysis, Routine w reflex microscopic     Status: Abnormal   Collection Time: 01/08/17  5:47 AM  Result Value Ref Range   Color, Urine STRAW (A) YELLOW   APPearance CLEAR (A) CLEAR   Specific Gravity, Urine 1.005 1.005 - 1.030   pH 7.0 5.0 - 8.0   Glucose, UA 50 (A) NEGATIVE mg/dL   Hgb urine dipstick NEGATIVE NEGATIVE   Bilirubin Urine NEGATIVE NEGATIVE   Ketones, ur NEGATIVE NEGATIVE mg/dL   Protein, ur NEGATIVE NEGATIVE mg/dL   Nitrite NEGATIVE NEGATIVE   Leukocytes, UA NEGATIVE NEGATIVE  Glucose, capillary     Status: Abnormal   Collection Time: 01/08/17  8:06 AM  Result Value Ref Range   Glucose-Capillary 148 (H) 65 - 99  mg/dL  Glucose, capillary     Status: Abnormal   Collection Time: 01/08/17 11:38 AM  Result Value Ref Range   Glucose-Capillary 197 (H) 65 - 99 mg/dL  CULTURE, BLOOD (ROUTINE X 2) w Reflex to ID Panel     Status: None   Collection Time: 01/08/17  1:41 PM  Result Value Ref Range   Specimen Description BLOOD RIGHT AC    Special Requests      BOTTLES DRAWN AEROBIC AND ANAEROBIC AER ANA   Culture NO GROWTH 5 DAYS    Report Status 01/13/2017 FINAL   CULTURE, BLOOD (ROUTINE X 2) w Reflex to ID Panel     Status: None   Collection Time: 01/08/17  1:54 PM  Result Value Ref Range   Specimen Description BLOOD RIGHT HAND    Special Requests       BOTTLES DRAWN AEROBIC AND ANAEROBIC AER ANA   Culture NO GROWTH 5 DAYS    Report Status 01/13/2017 FINAL   Glucose, capillary     Status: Abnormal   Collection Time: 01/08/17  6:14 PM  Result Value Ref Range   Glucose-Capillary 201 (H) 65 - 99 mg/dL  Glucose, capillary     Status: Abnormal   Collection Time: 01/08/17  8:57 PM  Result Value Ref Range   Glucose-Capillary 200 (H) 65 - 99 mg/dL   Comment 1 Notify RN   Comprehensive metabolic panel     Status: Abnormal   Collection Time: 01/09/17  6:21 AM  Result Value Ref Range   Sodium 138 135 - 145 mmol/L   Potassium 4.1 3.5 - 5.1 mmol/L   Chloride 101 101 - 111 mmol/L   CO2 32 22 - 32 mmol/L   Glucose, Bld 171 (H) 65 - 99 mg/dL   BUN 7 6 - 20 mg/dL   Creatinine, Ser 1.32 0.61 - 1.24 mg/dL   Calcium 8.5 (L) 8.9 - 10.3 mg/dL   Total Protein 5.1 (L) 6.5 - 8.1 g/dL   Albumin 2.3 (L) 3.5 - 5.0 g/dL   AST 14 (L) 15 - 41 U/L   ALT 15 (L) 17 - 63 U/L   Alkaline Phosphatase 51 38 - 126 U/L   Total Bilirubin 1.0 0.3 - 1.2 mg/dL   GFR calc non Af Amer >60 >60 mL/min   GFR calc Af Amer >60 >60 mL/min    Comment: (NOTE) The eGFR has been calculated using the CKD EPI equation. This calculation has not been validated in all clinical situations. eGFR's persistently <60 mL/min signify possible Chronic Kidney Disease.    Anion gap 5 5 - 15  CBC     Status: Abnormal   Collection Time: 01/09/17  6:21 AM  Result Value Ref Range   WBC 18.3 (H) 3.8 - 10.6 K/uL   RBC 2.76 (L) 4.40 - 5.90 MIL/uL   Hemoglobin 7.6 (L) 13.0 - 18.0 g/dL   HCT 27.3 (L) 89.2 - 60.1 %   MCV 85.8 80.0 - 100.0 fL   MCH 27.5 26.0 - 34.0 pg   MCHC 32.1 32.0 - 36.0 g/dL   RDW 81.5 (H) 19.0 - 41.4 %   Platelets 376 150 - 440 K/uL  Glucose, capillary     Status: Abnormal   Collection Time: 01/09/17  7:33 AM  Result Value Ref Range   Glucose-Capillary 159 (H) 65 - 99 mg/dL  Glucose, capillary     Status: Abnormal   Collection Time: 01/09/17 11:50 AM   Result Value  Ref Range   Glucose-Capillary 152 (H) 65 - 99 mg/dL  Glucose, capillary     Status: Abnormal   Collection Time: 01/09/17  4:56 PM  Result Value Ref Range   Glucose-Capillary 173 (H) 65 - 99 mg/dL  Glucose, capillary     Status: Abnormal   Collection Time: 01/09/17  9:32 PM  Result Value Ref Range   Glucose-Capillary 188 (H) 65 - 99 mg/dL   Comment 1 Notify RN   Basic metabolic panel     Status: Abnormal   Collection Time: 01/10/17  4:50 AM  Result Value Ref Range   Sodium 137 135 - 145 mmol/L   Potassium 3.8 3.5 - 5.1 mmol/L   Chloride 99 (L) 101 - 111 mmol/L   CO2 33 (H) 22 - 32 mmol/L   Glucose, Bld 149 (H) 65 - 99 mg/dL   BUN 7 6 - 20 mg/dL   Creatinine, Ser 0.65 0.61 - 1.24 mg/dL   Calcium 8.4 (L) 8.9 - 10.3 mg/dL   GFR calc non Af Amer >60 >60 mL/min   GFR calc Af Amer >60 >60 mL/min    Comment: (NOTE) The eGFR has been calculated using the CKD EPI equation. This calculation has not been validated in all clinical situations. eGFR's persistently <60 mL/min signify possible Chronic Kidney Disease.    Anion gap 5 5 - 15  CBC     Status: Abnormal   Collection Time: 01/10/17  4:50 AM  Result Value Ref Range   WBC 13.2 (H) 3.8 - 10.6 K/uL   RBC 2.66 (L) 4.40 - 5.90 MIL/uL   Hemoglobin 7.2 (L) 13.0 - 18.0 g/dL   HCT 22.3 (L) 40.0 - 52.0 %   MCV 84.1 80.0 - 100.0 fL   MCH 27.1 26.0 - 34.0 pg   MCHC 32.2 32.0 - 36.0 g/dL   RDW 16.8 (H) 11.5 - 14.5 %   Platelets 393 150 - 440 K/uL  Glucose, capillary     Status: Abnormal   Collection Time: 01/10/17  7:40 AM  Result Value Ref Range   Glucose-Capillary 151 (H) 65 - 99 mg/dL   Comment 1 Notify RN   Prepare RBC     Status: None   Collection Time: 01/10/17  9:48 AM  Result Value Ref Range   Order Confirmation ORDER PROCESSED BY BLOOD BANK   Type and screen [place]     Status: None   Collection Time: 01/10/17  9:48 AM  Result Value Ref Range   ISSUE DATE / TIME [id number]    Blood  Product Unit Number [id number]    PRODUCT CODE [id number]    Unit Type and Rh 7300    Blood Product Expiration Date [id number]   Glucose, capillary     Status: Abnormal   Collection Time: 01/10/17 11:41 AM  Result Value Ref Range   Glucose-Capillary 147 (H) 65 - 99 mg/dL   Comment 1 Notify RN   Glucose, capillary     Status: Abnormal   Collection Time: 01/10/17  4:35 PM  Result Value Ref Range   Glucose-Capillary 149 (H) 65 - 99 mg/dL   Comment 1 Notify RN   Basic metabolic panel     Status: Abnormal   Collection Time: 01/10/17  5:19 PM  Result Value Ref Range   Sodium 137 135 - 145 mmol/L   Potassium 3.6 3.5 - 5.1 mmol/L   Chloride 103 101 - 111 mmol/L   CO2 29 22 - 32 mmol/L  Glucose, Bld 142 (H) 65 - 99 mg/dL   BUN 6 6 - 20 mg/dL   Creatinine, Ser 0.61 0.61 - 1.24 mg/dL   Calcium 8.6 (L) 8.9 - 10.3 mg/dL   GFR calc non Af Amer >60 >60 mL/min   GFR calc Af Amer >60 >60 mL/min    Comment: (NOTE) The eGFR has been calculated using the CKD EPI equation. This calculation has not been validated in all clinical situations. eGFR's persistently <60 mL/min signify possible Chronic Kidney Disease.    Anion gap 5 5 - 15  CBC     Status: Abnormal   Collection Time: 01/10/17  5:19 PM  Result Value Ref Range   WBC 13.8 (H) 3.8 - 10.6 K/uL   RBC 3.14 (L) 4.40 - 5.90 MIL/uL   Hemoglobin 8.5 (L) 13.0 - 18.0 g/dL   HCT 26.8 (L) 40.0 - 52.0 %   MCV 85.4 80.0 - 100.0 fL   MCH 27.2 26.0 - 34.0 pg   MCHC 31.8 (L) 32.0 - 36.0 g/dL   RDW 16.5 (H) 11.5 - 14.5 %   Platelets 408 150 - 440 K/uL  Glucose, capillary     Status: Abnormal   Collection Time: 01/10/17  9:03 PM  Result Value Ref Range   Glucose-Capillary 183 (H) 65 - 99 mg/dL  Basic metabolic panel     Status: Abnormal   Collection Time: 01/11/17  5:27 AM  Result Value Ref Range   Sodium 139 135 - 145 mmol/L   Potassium 3.7 3.5 - 5.1 mmol/L   Chloride 106 101 - 111 mmol/L   CO2 29 22 - 32 mmol/L   Glucose, Bld 196 (H) 65  - 99 mg/dL   BUN 6 6 - 20 mg/dL   Creatinine, Ser 0.60 (L) 0.61 - 1.24 mg/dL   Calcium 8.5 (L) 8.9 - 10.3 mg/dL   GFR calc non Af Amer >60 >60 mL/min   GFR calc Af Amer >60 >60 mL/min    Comment: (NOTE) The eGFR has been calculated using the CKD EPI equation. This calculation has not been validated in all clinical situations. eGFR's persistently <60 mL/min signify possible Chronic Kidney Disease.    Anion gap 4 (L) 5 - 15  CBC     Status: Abnormal   Collection Time: 01/11/17  5:27 AM  Result Value Ref Range   WBC 10.4 3.8 - 10.6 K/uL   RBC 2.78 (L) 4.40 - 5.90 MIL/uL   Hemoglobin 7.6 (L) 13.0 - 18.0 g/dL   HCT 23.2 (L) 40.0 - 52.0 %   MCV 83.3 80.0 - 100.0 fL   MCH 27.5 26.0 - 34.0 pg   MCHC 33.0 32.0 - 36.0 g/dL   RDW 16.3 (H) 11.5 - 14.5 %   Platelets 428 150 - 440 K/uL  Glucose, capillary     Status: Abnormal   Collection Time: 01/11/17  7:50 AM  Result Value Ref Range   Glucose-Capillary 117 (H) 65 - 99 mg/dL   Comment 1 Notify RN   Glucose, capillary     Status: Abnormal   Collection Time: 01/11/17 12:09 PM  Result Value Ref Range   Glucose-Capillary 140 (H) 65 - 99 mg/dL   Comment 1 Notify RN   Glucose, capillary     Status: Abnormal   Collection Time: 01/11/17  5:15 PM  Result Value Ref Range   Glucose-Capillary 133 (H) 65 - 99 mg/dL   Comment 1 Notify RN   Glucose, capillary     Status:  Abnormal   Collection Time: 01/11/17  9:30 PM  Result Value Ref Range   Glucose-Capillary 148 (H) 65 - 99 mg/dL  Basic metabolic panel     Status: Abnormal   Collection Time: 01/12/17  6:41 AM  Result Value Ref Range   Sodium 139 135 - 145 mmol/L   Potassium 3.7 3.5 - 5.1 mmol/L   Chloride 107 101 - 111 mmol/L   CO2 27 22 - 32 mmol/L   Glucose, Bld 116 (H) 65 - 99 mg/dL   BUN 7 6 - 20 mg/dL   Creatinine, Ser 0.59 (L) 0.61 - 1.24 mg/dL   Calcium 8.5 (L) 8.9 - 10.3 mg/dL   GFR calc non Af Amer >60 >60 mL/min   GFR calc Af Amer >60 >60 mL/min    Comment: (NOTE) The eGFR  has been calculated using the CKD EPI equation. This calculation has not been validated in all clinical situations. eGFR's persistently <60 mL/min signify possible Chronic Kidney Disease.    Anion gap 5 5 - 15  CBC     Status: Abnormal   Collection Time: 01/12/17  6:41 AM  Result Value Ref Range   WBC 9.8 3.8 - 10.6 K/uL   RBC 2.93 (L) 4.40 - 5.90 MIL/uL   Hemoglobin 7.8 (L) 13.0 - 18.0 g/dL   HCT 24.2 (L) 40.0 - 52.0 %   MCV 82.3 80.0 - 100.0 fL   MCH 26.4 26.0 - 34.0 pg   MCHC 32.1 32.0 - 36.0 g/dL   RDW 16.9 (H) 11.5 - 14.5 %   Platelets 484 (H) 150 - 440 K/uL  Glucose, capillary     Status: Abnormal   Collection Time: 01/12/17  8:12 AM  Result Value Ref Range   Glucose-Capillary 121 (H) 65 - 99 mg/dL   Comment 1 Notify RN   Glucose, capillary     Status: Abnormal   Collection Time: 01/12/17 11:57 AM  Result Value Ref Range   Glucose-Capillary 190 (H) 65 - 99 mg/dL   Comment 1 Notify RN   Glucose, capillary     Status: Abnormal   Collection Time: 01/12/17  4:57 PM  Result Value Ref Range   Glucose-Capillary 180 (H) 65 - 99 mg/dL   Comment 1 Notify RN   Glucose, capillary     Status: Abnormal   Collection Time: 01/12/17  9:24 PM  Result Value Ref Range   Glucose-Capillary 120 (H) 65 - 99 mg/dL  Hemoglobin and hematocrit, blood     Status: Abnormal   Collection Time: 01/13/17  5:33 AM  Result Value Ref Range   Hemoglobin 8.1 (L) 13.0 - 18.0 g/dL   HCT 24.6 (L) 40.0 - 52.0 %  Glucose, capillary     Status: Abnormal   Collection Time: 01/13/17  8:00 AM  Result Value Ref Range   Glucose-Capillary 145 (H) 65 - 99 mg/dL   Comment 1 Notify RN   Glucose, capillary     Status: Abnormal   Collection Time: 01/13/17 11:41 AM  Result Value Ref Range   Glucose-Capillary 200 (H) 65 - 99 mg/dL   Comment 1 Notify RN   Glucose, capillary     Status: Abnormal   Collection Time: 01/13/17  4:37 PM  Result Value Ref Range   Glucose-Capillary 143 (H) 65 - 99 mg/dL   Comment 1  Notify RN   Glucose, capillary     Status: Abnormal   Collection Time: 01/13/17  9:03 PM  Result Value Ref Range  Glucose-Capillary 136 (H) 65 - 99 mg/dL  Glucose, capillary     Status: Abnormal   Collection Time: 01/14/17  7:26 AM  Result Value Ref Range   Glucose-Capillary 134 (H) 65 - 99 mg/dL   Comment 1 Notify RN   Glucose, capillary     Status: Abnormal   Collection Time: 01/14/17 11:35 AM  Result Value Ref Range   Glucose-Capillary 213 (H) 65 - 99 mg/dL   Comment 1 Notify RN   Glucose, capillary     Status: None   Collection Time: 01/14/17  4:48 PM  Result Value Ref Range   Glucose-Capillary 89 65 - 99 mg/dL   Comment 1 Notify RN   Glucose, capillary     Status: Abnormal   Collection Time: 01/14/17  9:30 PM  Result Value Ref Range   Glucose-Capillary 112 (H) 65 - 99 mg/dL  Creatinine, serum     Status: Abnormal   Collection Time: 01/15/17  4:38 AM  Result Value Ref Range   Creatinine, Ser 0.56 (L) 0.61 - 1.24 mg/dL   GFR calc non Af Amer >60 >60 mL/min   GFR calc Af Amer >60 >60 mL/min    Comment: (NOTE) The eGFR has been calculated using the CKD EPI equation. This calculation has not been validated in all clinical situations. eGFR's persistently <60 mL/min signify possible Chronic Kidney Disease.   Glucose, capillary     Status: Abnormal   Collection Time: 01/15/17  7:33 AM  Result Value Ref Range   Glucose-Capillary 125 (H) 65 - 99 mg/dL  Glucose, capillary     Status: Abnormal   Collection Time: 01/15/17  8:47 AM  Result Value Ref Range   Glucose-Capillary 126 (H) 65 - 99 mg/dL  Glucose, capillary     Status: Abnormal   Collection Time: 01/15/17 11:45 AM  Result Value Ref Range   Glucose-Capillary 230 (H) 65 - 99 mg/dL  Glucose, capillary     Status: Abnormal   Collection Time: 01/15/17  1:03 PM  Result Value Ref Range   Glucose-Capillary 149 (H) 65 - 99 mg/dL  Glucose, capillary     Status: Abnormal   Collection Time: 01/15/17  4:36 PM  Result Value  Ref Range   Glucose-Capillary 114 (H) 65 - 99 mg/dL    Radiology Dg Chest 1 View  Result Date: 01/09/2017 CLINICAL DATA:  Wheezing EXAM: CHEST 1 VIEW COMPARISON:  01/04/2017 FINDINGS: Cardiomediastinal silhouette is stable. No infiltrate or pleural effusion. No pulmonary edema. Degenerative changes mid and lower thoracic spine. IMPRESSION: No active disease. Electronically Signed   By: Lahoma Crocker M.D.   On: 01/09/2017 12:17   Dg Chest 2 View  Result Date: 01/14/2017 CLINICAL DATA:  Cough, former smoker EXAM: CHEST  2 VIEW COMPARISON:  01/09/2017 FINDINGS: Cardiomediastinal silhouette is stable. There is streaky atelectasis or infiltrate in right middle lobe. No pulmonary edema. Degenerative changes thoracic spine. IMPRESSION: No pulmonary edema. Streaky atelectasis or infiltrate in right middle lobe. Electronically Signed   By: Lahoma Crocker M.D.   On: 01/14/2017 12:00   Dg Chest 2 View  Result Date: 01/04/2017 CLINICAL DATA:  Shortness of breath, rapid heart rate EXAM: CHEST  2 VIEW COMPARISON:  None. FINDINGS: The heart size and mediastinal contours are within normal limits. Both lungs are clear. The visualized skeletal structures are unremarkable. IMPRESSION: No active cardiopulmonary disease. Electronically Signed   By: Kathreen Devoid   On: 01/04/2017 12:00   Nm Gi Blood Loss  Result Date: 12/27/2016  CLINICAL DATA:  GI bleed. EXAM: NUCLEAR MEDICINE GASTROINTESTINAL BLEEDING SCAN TECHNIQUE: Sequential abdominal images were obtained following intravenous administration of Tc-39mlabeled red blood cells. RADIOPHARMACEUTICALS:  19.017 mCi [id number]-vitro labeled red cells. COMPARISON:  None. FINDINGS: There is immediate abnormal activity in loops of bowel in the lower pelvis, probably in the sigmoid portion of the colon. IMPRESSION: Abnormal GI bleeding scan. The study is positive for bleeding in the distal colon. Electronically Signed   By: JaLorriane Shire.D.   On: 12/27/2016 16:04   [place] Img  Lower Bilateral  Result Date: 01/09/2017 CLINICAL DATA:  Swelling x7 days EXAM: BILATERAL LOWER EXTREMITY VENOUS DOPPLER ULTRASOUND TECHNIQUE: Gray-scale sonography with compression, as well as color and duplex ultrasound, were performed to evaluate the deep venous system from the level of the common femoral vein through the popliteal and proximal calf veins. COMPARISON:  None FINDINGS: On the right, Normal compressibility of the common femoral, superficial femoral, and popliteal veins, as well as the proximal calf veins. No filling defects to suggest DVT on grayscale or color Doppler imaging. Doppler waveforms show normal direction of venous flow, normal respiratory phasicity and response to augmentation. There is thrombosis of a gastrocnemius vein with noncompressibility and no color Doppler signal. 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 One moiety is noncompressible with no color Doppler signal. Normal compressibility of common femoral, deep femoral, and popliteal veins as well was visualized calf veins. There is decreased response to augmentation in the popliteal vein. IMPRESSION: 1. 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 2. Isolated right gastrocnemius vein (calf) DVT. Electronically Signed   By: D Lucrezia Europe.D.   On: 01/09/2017 10:32   Dg Colon W/Alonza Bogusi Den Cm  Addendum Date: 12/29/2016   ADDENDUM REPORT: 12/29/2016 08:43 ADDENDUM: Findings and recommendations also conveyed toDr. PaSteele Sizer2/30/2017 at8:00. Electronically Signed   By: StSuzy Bouchard.D.   On: 12/29/2016 08:43   Result Date: 12/29/2016 CLINICAL DATA:  Sigmoid colon gastrointestinal bleeding. EXAM: SINGLE COLUMN BARIUM ENEMA TECHNIQUE: Initial scout AP supine abdominal image obtained to insure adequate colon cleansing. Barium was introduced into the colon in a retrograde fashion and refluxed from the rectum to the cecum. Spot images of the colon followed by overhead  radiographs were obtained. FLUOROSCOPY TIME:  Fluoroscopy Time:  3.24 Radiation Exposure Index (if provided by the fluoroscopic device): 136 mGy Number of Acquired Spot Images: 2 COMPARISON:  None. FINDINGS: Intermittent inserted into the rectum and retention balloon inflated. High-density barium was introduced into the rectum via gravity feed. Contrast flowed into the sigmoid colon. Contrast could not be advanced past the sigmoid colon. 1200 cc was introduced with ultimately leakage around the enema tip and no advancement into the descending colon. No diverticular disease noted in the sigmoid colon. IMPRESSION: 1. High-density therapy barium enema for gastrointestinal bleeding per general surgery request. 2. No evidence of diverticular disease in the sigmoid colon. 3. Contrast could not be advanced past the proximal sigmoid colon. Cannot exclude obstructing mass. Recommend CT of the abdomen and pelvis for further evaluation (recognizing some artifact from the high-density barium). Initial Findings conveyed toJOSE PISCOYA on 12/29/2016  at3:45. Electronically Signed: By: StSuzy Bouchard.D. On: 12/29/2016 07:49     Assessment/Plan  S/P partial colectomy For diverticular bleeding which did not respond to embolization. Bleeding has been stopped.  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 Also, involved in wound healing. On appropriate medications.   Hypertension blood pressure control important in reducing the progression of atherosclerotic disease. On appropriate oral medications.   DVT (deep venous thrombosis) ([place]) He was found to have a right calf vein DVT. 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 He has tolerated aspirin well. I will plan to reimage him in 2-3 weeks to ensure that  propagation has not occurred on aspirin. I will also reimage the left leg as it is not clear to me if this is an acute or chronic DVT and it was not well-differentiated at the hospital, which is typical. He should elevate his legs more compression stockings.    Leotis Pain, MD  01/22/2017 2:06 PM    This note was created with Dragon medical transcription system.  Any errors from dictation are purely unintentional

## 2017-01-22 NOTE — Assessment & Plan Note (Signed)
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 He has tolerated aspirin well. I will plan to reimage him in 2-3 weeks to ensure that propagation has not occurred on aspirin. I will also reimage the left leg as it is not clear to me if this is an acute or chronic DVT and it was not well-differentiated at the hospital, which is typical. He should elevate his legs more compression stockings.

## 2017-01-22 NOTE — Assessment & Plan Note (Signed)
blood pressure control important in reducing the progression of atherosclerotic disease. On appropriate oral medications.  

## 2017-01-22 NOTE — Assessment & Plan Note (Signed)
For diverticular bleeding which did not respond to embolization. Bleeding has been stopped.

## 2017-01-24 DIAGNOSIS — I82431 Acute embolism and thrombosis of right popliteal vein: Secondary | ICD-10-CM | POA: Insufficient documentation

## 2017-01-24 DIAGNOSIS — H47012 Ischemic optic neuropathy, left eye: Secondary | ICD-10-CM | POA: Insufficient documentation

## 2017-02-05 ENCOUNTER — Ambulatory Visit (INDEPENDENT_AMBULATORY_CARE_PROVIDER_SITE_OTHER): Payer: Medicare Other

## 2017-02-05 ENCOUNTER — Ambulatory Visit (INDEPENDENT_AMBULATORY_CARE_PROVIDER_SITE_OTHER): Payer: Medicare Other | Admitting: Vascular Surgery

## 2017-02-05 VITALS — BP 151/78 | HR 81 | Resp 16 | Ht 75.0 in | Wt 281.0 lb

## 2017-02-05 DIAGNOSIS — E7800 Pure hypercholesterolemia, unspecified: Secondary | ICD-10-CM

## 2017-02-05 DIAGNOSIS — Z8719 Personal history of other diseases of the digestive system: Secondary | ICD-10-CM | POA: Diagnosis not present

## 2017-02-05 DIAGNOSIS — I10 Essential (primary) hypertension: Secondary | ICD-10-CM | POA: Diagnosis not present

## 2017-02-05 DIAGNOSIS — Z9049 Acquired absence of other specified parts of digestive tract: Secondary | ICD-10-CM

## 2017-02-05 DIAGNOSIS — R809 Proteinuria, unspecified: Secondary | ICD-10-CM

## 2017-02-05 DIAGNOSIS — I82441 Acute embolism and thrombosis of right tibial vein: Secondary | ICD-10-CM

## 2017-02-05 DIAGNOSIS — E1129 Type 2 diabetes mellitus with other diabetic kidney complication: Secondary | ICD-10-CM

## 2017-02-05 NOTE — Progress Notes (Signed)
MRN : [id number]  Evan [name] is a 72 y.o. (Apr 07, 1945) male who presents with chief complaint of  Chief Complaint  Patient presents with  . Re-evaluation    ASAP DVT  .  History of Present Illness: Patient returns today in follow up of His DVT. He has had no worsening pain or swelling in his legs. He has no chest pain or shortness of breath. 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   Current Outpatient Prescriptions  Medication Sig Dispense Refill  . acetaminophen (TYLENOL) 500 MG tablet Take 1,000 mg by mouth every 6 (six) hours as needed.    [name] amLODipine (NORVASC) 5 MG tablet Take 5 mg by mouth daily.    [name] aspirin EC 81 MG EC tablet Take 1 tablet (81 mg total) by mouth daily. 30 tablet 5  . BAYER CONTOUR TEST test strip USE TO TEST BLOOD SUGAR QD UTD  3  . Cholecalciferol (VITAMIN D3) 2000 units capsule Take 2,000 Units by mouth daily.    [name] docusate calcium (SURFAK) 240 MG capsule Take 1 capsule by mouth daily.    . dorzolamide (TRUSOPT) 2 % ophthalmic solution Place 1 drop into both eyes 2 (two) times daily. 10 mL 12  . dorzolamide-timolol (COSOPT) 22.3-6.8 MG/ML ophthalmic solution Apply 1 drop to eye 2 (two) times daily.    . feeding supplement (BOOST / RESOURCE BREEZE) LIQD Take 1 Container by mouth 3 (three) times daily between meals. 90 Container 5  . ferrous gluconate (FERGON) 324 MG tablet Take 1 tablet (324 mg total) by mouth 2 (two) times daily with a meal. 60 tablet 5  . latanoprost (XALATAN) 0.005 % ophthalmic solution Apply 1 drop to eye at bedtime.    . metFORMIN (GLUCOPHAGE) 1000 MG tablet Take 1,000 mg by mouth daily with breakfast.     . Multiple Vitamins-Minerals (MULTIVITAMIN ADULT PO) Take 1 tablet by mouth  daily.    . Omega-3 Fatty Acids (FISH OIL) 1200 MG CAPS Take 2 capsules by mouth daily.    . pravastatin (PRAVACHOL) 40 MG tablet Take 40 mg by mouth daily.    . timolol (TIMOPTIC) 0.5 % ophthalmic solution Place 1 drop into both eyes 2 (two) times daily. 10 mL 12  . guaiFENesin (MUCINEX) 600 MG 12 hr tablet Take 1 tablet (600 mg total) by mouth 2 (two) times daily. (Patient not taking: Reported on 01/22/2017) 60 tablet 1  . oxyCODONE 10 MG TABS Take 1 tablet (10 mg total) by mouth every 3 (three) hours as needed for moderate pain. (Patient not taking: Reported on 01/22/2017) 30 tablet 0   No current facility-administered medications for this visit.         Past Medical History:  Diagnosis Date  . Atrial fibrillation ([place])   . BPH (benign prostatic hyperplasia)   . CHF (congestive heart failure) ([place])   . Diabetes mellitus without complication ([place])   . Diverticulosis   . Hyperlipidemia   . Hypertension   . Sleep apnea          Past Surgical History:  Procedure Laterality Date  . ATRIAL ABLATION SURGERY    . CHOLECYSTECTOMY    . LAPAROTOMY  01/01/2017   Procedure: EXPLORATORY LAPAROTOMY;  Surgeon: Olean Ree, MD;  Location: [facility];  Service: General;;  . PARTIAL COLECTOMY N/A 01/01/2017   Procedure: PARTIAL COLECTOMY;  Surgeon: Olean Ree, MD;  Location: [facility];  Service: General;  Laterality: N/A;  . PERIPHERAL VASCULAR CATHETERIZATION N/A 12/28/2016   Procedure: Visceral Artery Intervention;  Surgeon: Algernon Huxley, MD;  Location: [facility];  Service: Cardiovascular;  Laterality: N/A;  . SECONDARY CLOSURE OF WOUND N/A 01/08/2017   Procedure: SECONDARY CLOSURE FOR EVISCERATION;  Surgeon: Florene Glen, MD;  Location: [facility];  Service: General;  Laterality: N/A;  . TOTAL KNEE ARTHROPLASTY Right     Social History      Social History  Substance Use Topics  . Smoking status: Former Smoker    Packs/day: 0.25    Years: 29.00      Types: Cigarettes    Quit date: 12/31/1993  . Smokeless tobacco: Never Used  . Alcohol use No    Family History      Family History  Problem Relation Age of Onset  . Diabetes Sister   . Diabetes Brother   . Diabetes Sister   . Diabetes Brother          Allergies  Allergen Reactions  . Azithromycin Rash  . Penicillins Rash    Has patient had a PCN reaction causing immediate rash, facial/tongue/throat swelling, SOB or lightheadedness with hypotension: Yes Has patient had a PCN reaction causing severe rash involving mucus membranes or skin necrosis: Yes Has patient had a PCN reaction that required hospitalization No Has patient had a PCN reaction occurring within the last 10 years: No If all of the above answers are "NO", then may proceed with Cephalosporin use.   [name] Lisinopril Cough  . Losartan Cough  . Metoprolol Other (See Comments)    Bradycardia.     REVIEW OF SYSTEMS (Negative unless checked)  Constitutional: _0 Weight loss  _1 Fever  _2 Chills Cardiac: _3 Chest pain   _4 Chest pressure   _5 Palpitations   _6 Shortness of breath when laying flat   _7 Shortness of breath at rest   _8 Shortness of breath with exertion. Vascular:  _9 Pain in legs with walking   _10 Pain in legs at rest   _11 Pain in legs when laying flat   _12 Claudication   _13 Pain in feet when walking  _14 Pain in feet at rest  _15 Pain in feet when laying flat   _16 History of DVT   _17 Phlebitis   _18 Swelling in legs   _19 Varicose veins   _20 Non-healing ulcers Pulmonary:   _21 Uses home oxygen   _22 Productive cough   _23 Hemoptysis   _24 Wheeze  _25 COPD   _26 Asthma Neurologic:  _27 Dizziness  _28 Blackouts   _29 Seizures   _30 History of stroke   _31 History of TIA  _32 Aphasia   _33 Temporary blindness   _34 Dysphagia   _35 Weakness or numbness in arms   _36 Weakness or numbness in legs Musculoskeletal:  _37 Arthritis   _38 Joint swelling   _39 Joint pain   _40 Low back pain Hematologic:  _41 Easy bruising  _42 Easy bleeding   _43 Hypercoagulable  state   _44 Anemic   Gastrointestinal:  _45 Blood in stool   _46 Vomiting blood  _47 Gastroesophageal reflux/heartburn   _48 Abdominal pain Genitourinary:  _49 Chronic kidney disease   _50 Difficult urination  _51 Frequent urination  _52 Burning with urination   _53 Hematuria Skin:  _54 Rashes   _55 Ulcers   _56 Wounds Psychological:  _57 History of anxiety   _58  History of major depression.  Physical Examination  BP 139/79   Pulse 84   Resp 16   Ht 6' 3.25" (1.911 m)   Wt 278 lb (  126.1 kg)   BMI 34.52 kg/m  Gen:  WD/WN, NAD Head: [place]/AT, No temporalis wasting. Ear/Nose/Throat: Hearing grossly intact, nares w/o erythema or drainage, trachea midline Eyes: Conjunctiva clear. Sclera non-icteric Neck: Supple.  No JVD.  Pulmonary:  Good air movement, no use of accessory muscles.  Cardiac: RRR, normal S1, S2 Vascular:  Vessel Right Left  Radial Palpable Palpable  Ulnar Palpable Palpable  Brachial Palpable Palpable  Carotid Palpable, without bruit Palpable, without bruit  Aorta Not palpable N/A  Femoral Palpable Palpable  Popliteal Palpable Palpable  PT Palpable Palpable  DP Palpable Palpable   Gastrointestinal: soft, non-tender/non-distended. Incision healing well. Musculoskeletal: M/S 5/5 throughout.  No deformity or atrophy. Minimal BLE edema. Neurologic: Sensation grossly intact in extremities.  Symmetrical.  Speech is fluent.  Psychiatric: Judgment intact, Mood & affect appropriate for pt's clinical situation. Dermatologic: No rashes or ulcers noted.  No cellulitis or open wounds. Lymph : No Cervical or axillary adenopathy    Labs Recent Results (from the past 2160 hour(s))  Comprehensive metabolic panel     Status: Abnormal   Collection Time: 12/27/16 10:04 AM  Result Value Ref Range   Sodium 135 135 - 145 mmol/L   Potassium 4.1 3.5 - 5.1 mmol/L   Chloride 102 101 - 111 mmol/L   CO2 26 22 - 32 mmol/L   Glucose, Bld 263 (H) 65 - 99 mg/dL   BUN 15 6 - 20 mg/dL   Creatinine, Ser 0.94 0.61 -  1.24 mg/dL   Calcium 9.7 8.9 - 10.3 mg/dL   Total Protein 7.1 6.5 - 8.1 g/dL   Albumin 3.9 3.5 - 5.0 g/dL   AST 20 15 - 41 U/L   ALT 21 17 - 63 U/L   Alkaline Phosphatase 71 38 - 126 U/L   Total Bilirubin 1.1 0.3 - 1.2 mg/dL   GFR calc non Af Amer >60 >60 mL/min   GFR calc Af Amer >60 >60 mL/min    Comment: (NOTE) The eGFR has been calculated using the CKD EPI equation. This calculation has not been validated in all clinical situations. eGFR's persistently <60 mL/min signify possible Chronic Kidney Disease.    Anion gap 7 5 - 15  CBC     Status: Abnormal   Collection Time: 12/27/16 10:04 AM  Result Value Ref Range   WBC 7.3 3.8 - 10.6 K/uL   RBC 5.38 4.40 - 5.90 MIL/uL   Hemoglobin 14.9 13.0 - 18.0 g/dL   HCT 45.5 40.0 - 52.0 %   MCV 84.6 80.0 - 100.0 fL   MCH 27.7 26.0 - 34.0 pg   MCHC 32.7 32.0 - 36.0 g/dL   RDW 14.6 (H) 11.5 - 14.5 %   Platelets 281 150 - 440 K/uL  Type and screen [place]     Status: None   Collection Time: 12/27/16 10:04 AM  Result Value Ref Range   ABO/RH(D) B POS    Antibody Screen NEG    Sample Expiration 12/30/2016    Unit Number [id number]    Blood Component Type RED CELLS,LR    Unit division 00    Status of Unit ISSUED,FINAL    Transfusion Status OK TO TRANSFUSE    Crossmatch Result COMPATIBLE    Unit Number [id number]    Blood Component Type RBC LR PHER2    Unit division 00    Status of Unit REL FROM [facility]    Transfusion Status OK TO TRANSFUSE    Crossmatch Result COMPATIBLE  Unit Number [id number]    Blood Component Type RED CELLS,LR    Unit division 00    Status of Unit ISSUED,FINAL    Transfusion Status OK TO TRANSFUSE    Crossmatch Result Compatible    Unit Number [id number]    Blood Component Type RED CELLS,LR    Unit division 00    Status of Unit REL FROM [facility]    Transfusion Status OK TO TRANSFUSE    Crossmatch Result Compatible    Unit Number [id number]    Blood Component Type RBC  LR PHER2    Unit division 00    Status of Unit ISSUED,FINAL    Transfusion Status OK TO TRANSFUSE    Crossmatch Result Compatible    Unit Number [id number]    Blood Component Type RED CELLS,LR    Unit division 00    Status of Unit REL FROM [facility]    Transfusion Status OK TO TRANSFUSE    Crossmatch Result Compatible    Unit Number [id number]    Blood Component Type RBC LR PHER2    Unit division 00    Status of Unit ISSUED,FINAL    Transfusion Status OK TO TRANSFUSE    Crossmatch Result Compatible    Unit Number [id number]    Blood Component Type RBC, LR IRR    Unit division 00    Status of Unit ISSUED,FINAL    Transfusion Status OK TO TRANSFUSE    Crossmatch Result Compatible    Unit Number [id number]    Blood Component Type RED CELLS,LR    Unit division 00    Status of Unit ISSUED,FINAL    Transfusion Status OK TO TRANSFUSE    Crossmatch Result Compatible   Troponin I     Status: None   Collection Time: 12/27/16 10:04 AM  Result Value Ref Range   Troponin I <0.03 <0.03 ng/mL  Hemoglobin     Status: Abnormal   Collection Time: 12/27/16 12:17 PM  Result Value Ref Range   Hemoglobin 12.0 (L) 13.0 - 18.0 g/dL  ABO/Rh     Status: None   Collection Time: 12/27/16 12:17 PM  Result Value Ref Range   ABO/RH(D) B POS   Prepare RBC     Status: None   Collection Time: 12/27/16 12:30 PM  Result Value Ref Range   Order Confirmation ORDER PROCESSED BY BLOOD BANK   Glucose, capillary     Status: Abnormal   Collection Time: 12/27/16  5:18 PM  Result Value Ref Range   Glucose-Capillary 155 (H) 65 - 99 mg/dL  Hemoglobin     Status: None   Collection Time: 12/27/16  6:23 PM  Result Value Ref Range   Hemoglobin 13.0 13.0 - 18.0 g/dL  Glucose, capillary     Status: Abnormal   Collection Time: 12/27/16  9:10 PM  Result Value Ref Range   Glucose-Capillary 222 (H) 65 - 99 mg/dL  MRSA PCR Screening     Status: None   Collection Time: 12/27/16  9:15 PM  Result Value Ref  Range   MRSA by PCR NEGATIVE NEGATIVE    Comment:        The GeneXpert MRSA Assay (FDA approved for NASAL specimens only), is one component of a comprehensive MRSA colonization surveillance program. It is not intended to diagnose MRSA infection nor to guide or monitor treatment for MRSA infections.   Basic metabolic panel     Status: Abnormal   Collection Time: 12/28/16  3:43 AM  Result Value Ref  Range   Sodium 139 135 - 145 mmol/L   Potassium 4.4 3.5 - 5.1 mmol/L   Chloride 110 101 - 111 mmol/L   CO2 25 22 - 32 mmol/L   Glucose, Bld 222 (H) 65 - 99 mg/dL   BUN 16 6 - 20 mg/dL   Creatinine, Ser 0.85 0.61 - 1.24 mg/dL   Calcium 8.9 8.9 - 10.3 mg/dL   GFR calc non Af Amer >60 >60 mL/min   GFR calc Af Amer >60 >60 mL/min    Comment: (NOTE) The eGFR has been calculated using the CKD EPI equation. This calculation has not been validated in all clinical situations. eGFR's persistently <60 mL/min signify possible Chronic Kidney Disease.    Anion gap 4 (L) 5 - 15  CBC     Status: Abnormal   Collection Time: 12/28/16  3:43 AM  Result Value Ref Range   WBC 14.2 (H) 3.8 - 10.6 K/uL   RBC 3.82 (L) 4.40 - 5.90 MIL/uL   Hemoglobin 10.9 (L) 13.0 - 18.0 g/dL   HCT 32.5 (L) 40.0 - 52.0 %   MCV 85.0 80.0 - 100.0 fL   MCH 28.6 26.0 - 34.0 pg   MCHC 33.6 32.0 - 36.0 g/dL   RDW 14.4 11.5 - 14.5 %   Platelets 199 150 - 440 K/uL  Glucose, capillary     Status: Abnormal   Collection Time: 12/28/16  7:18 AM  Result Value Ref Range   Glucose-Capillary 174 (H) 65 - 99 mg/dL  Glucose, capillary     Status: Abnormal   Collection Time: 12/28/16 11:16 AM  Result Value Ref Range   Glucose-Capillary 179 (H) 65 - 99 mg/dL  CBC with Differential/Platelet     Status: Abnormal   Collection Time: 12/28/16  3:50 PM  Result Value Ref Range   WBC 16.6 (H) 3.8 - 10.6 K/uL   RBC 2.80 (L) 4.40 - 5.90 MIL/uL   Hemoglobin 7.8 (L) 13.0 - 18.0 g/dL   HCT 24.2 (L) 40.0 - 52.0 %   MCV 86.5 80.0 - 100.0 fL     MCH 27.9 26.0 - 34.0 pg   MCHC 32.2 32.0 - 36.0 g/dL   RDW 14.3 11.5 - 14.5 %   Platelets 184 150 - 440 K/uL   Neutrophils Relative % 75 %   Neutro Abs 12.4 (H) 1.4 - 6.5 K/uL   Lymphocytes Relative 17 %   Lymphs Abs 2.9 1.0 - 3.6 K/uL   Monocytes Relative 8 %   Monocytes Absolute 1.3 (H) 0.2 - 1.0 K/uL   Eosinophils Relative 0 %   Eosinophils Absolute 0.0 0 - 0.7 K/uL   Basophils Relative 0 %   Basophils Absolute 0.0 0 - 0.1 K/uL  Lactic acid, plasma     Status: Abnormal   Collection Time: 12/28/16  3:50 PM  Result Value Ref Range   Lactic Acid, Venous 4.2 (HH) 0.5 - 1.9 mmol/L    Comment: CRITICAL RESULT CALLED TO, READ BACK BY AND VERIFIED WITH MYRA FLOWERS @ 1741 12/28/16 BY [facility]   Ammonia     Status: Abnormal   Collection Time: 12/28/16  3:50 PM  Result Value Ref Range   Ammonia 46 (H) 9 - 35 umol/L  Protime-INR     Status: Abnormal   Collection Time: 12/28/16  3:50 PM  Result Value Ref Range   Prothrombin Time 15.7 (H) 11.4 - 15.2 seconds   INR 1.24   APTT     Status: None  Collection Time: 12/28/16  3:50 PM  Result Value Ref Range   aPTT 25 24 - 36 seconds  Glucose, capillary     Status: Abnormal   Collection Time: 12/28/16  5:08 PM  Result Value Ref Range   Glucose-Capillary 287 (H) 65 - 99 mg/dL  Prepare RBC     Status: None   Collection Time: 12/28/16  6:30 PM  Result Value Ref Range   Order Confirmation      2 UNITS TOTAL PER DR. Darvin Neighbours 12/28/16 1750 [facility] ORDER PROCESSED BY BLOOD BANK  Glucose, capillary     Status: Abnormal   Collection Time: 12/28/16  9:22 PM  Result Value Ref Range   Glucose-Capillary 305 (H) 65 - 99 mg/dL   Comment 1 Notify RN   Glucose, capillary     Status: Abnormal   Collection Time: 12/28/16 10:18 PM  Result Value Ref Range   Glucose-Capillary 282 (H) 65 - 99 mg/dL   Comment 1 Notify RN   Glucose, capillary     Status: Abnormal   Collection Time: 12/28/16 11:41 PM  Result Value Ref Range   Glucose-Capillary 288 (H) 65 - 99  mg/dL   Comment 1 Notify RN   CBC     Status: Abnormal   Collection Time: 12/29/16  1:24 AM  Result Value Ref Range   WBC 24.7 (H) 3.8 - 10.6 K/uL   RBC 2.89 (L) 4.40 - 5.90 MIL/uL   Hemoglobin 8.2 (L) 13.0 - 18.0 g/dL   HCT 24.7 (L) 40.0 - 52.0 %   MCV 85.4 80.0 - 100.0 fL   MCH 28.2 26.0 - 34.0 pg   MCHC 33.0 32.0 - 36.0 g/dL   RDW 14.6 (H) 11.5 - 14.5 %   Platelets 144 (L) 150 - 440 K/uL  Lactic acid, plasma     Status: Abnormal   Collection Time: 12/29/16  1:24 AM  Result Value Ref Range   Lactic Acid, Venous 4.8 (HH) 0.5 - 1.9 mmol/L    Comment: CRITICAL RESULT CALLED TO, READ BACK BY AND VERIFIED WITH BRITTON LEE RUST CHESTER AT 0210 12/29/16.PMH  Prepare RBC     Status: None   Collection Time: 12/29/16  2:30 AM  Result Value Ref Range   Order Confirmation ORDER PROCESSED BY BLOOD BANK   Glucose, capillary     Status: Abnormal   Collection Time: 12/29/16  2:56 AM  Result Value Ref Range   Glucose-Capillary 257 (H) 65 - 99 mg/dL   Comment 1 Notify RN   Glucose, capillary     Status: Abnormal   Collection Time: 12/29/16  7:33 AM  Result Value Ref Range   Glucose-Capillary 190 (H) 65 - 99 mg/dL  Lactic acid, plasma     Status: Abnormal   Collection Time: 12/29/16 10:25 AM  Result Value Ref Range   Lactic Acid, Venous 2.9 (HH) 0.5 - 1.9 mmol/L    Comment: CRITICAL RESULT CALLED TO, READ BACK BY AND VERIFIED WITH KATHERINE CLAYTON @ 1107 12/29/16 BY [facility]   CULTURE, BLOOD (ROUTINE X 2) w Reflex to ID Panel     Status: None   Collection Time: 12/29/16 10:25 AM  Result Value Ref Range   Specimen Description BLOOD LEFT HAND    Special Requests BOTTLES DRAWN AEROBIC AND ANAEROBIC ANA9ML AER 8ML    Culture NO GROWTH 5 DAYS    Report Status 01/03/2017 FINAL   Hemoglobin     Status: Abnormal   Collection Time: 12/29/16 10:27 AM  Result  Value Ref Range   Hemoglobin 8.6 (L) 13.0 - 18.0 g/dL  CULTURE, BLOOD (ROUTINE X 2) w Reflex to ID Panel     Status: None   Collection Time:  12/29/16 10:46 AM  Result Value Ref Range   Specimen Description BLOOD RIGHT HAND    Special Requests      BOTTLES DRAWN AEROBIC AND ANAEROBIC  AER 5 ML ANA 4 ML   Culture NO GROWTH 5 DAYS    Report Status 01/03/2017 FINAL   Glucose, capillary     Status: Abnormal   Collection Time: 12/29/16 11:48 AM  Result Value Ref Range   Glucose-Capillary 154 (H) 65 - 99 mg/dL  Glucose, capillary     Status: Abnormal   Collection Time: 12/29/16  4:09 PM  Result Value Ref Range   Glucose-Capillary 129 (H) 65 - 99 mg/dL  Hemoglobin     Status: Abnormal   Collection Time: 12/29/16  8:25 PM  Result Value Ref Range   Hemoglobin 7.2 (L) 13.0 - 18.0 g/dL  Glucose, capillary     Status: Abnormal   Collection Time: 12/29/16  9:25 PM  Result Value Ref Range   Glucose-Capillary 102 (H) 65 - 99 mg/dL   Comment 1 Notify RN   Prepare RBC     Status: None   Collection Time: 12/29/16 10:00 PM  Result Value Ref Range   Order Confirmation ORDER PROCESSED BY BLOOD BANK   Glucose, capillary     Status: Abnormal   Collection Time: 12/30/16  2:40 AM  Result Value Ref Range   Glucose-Capillary 141 (H) 65 - 99 mg/dL   Comment 1 Notify RN    Comment 2 Document in Chart   Glucose, capillary     Status: Abnormal   Collection Time: 12/30/16  5:40 AM  Result Value Ref Range   Glucose-Capillary 122 (H) 65 - 99 mg/dL   Comment 1 Notify RN    Comment 2 Document in Chart   Basic metabolic panel     Status: Abnormal   Collection Time: 12/30/16  7:10 AM  Result Value Ref Range   Sodium 142 135 - 145 mmol/L    Comment: ELECTROLYTES REPEATED KBH   Potassium 4.1 3.5 - 5.1 mmol/L   Chloride 115 (H) 101 - 111 mmol/L   CO2 25 22 - 32 mmol/L   Glucose, Bld 139 (H) 65 - 99 mg/dL   BUN 20 6 - 20 mg/dL   Creatinine, Ser 0.91 0.61 - 1.24 mg/dL   Calcium 8.3 (L) 8.9 - 10.3 mg/dL   GFR calc non Af Amer >60 >60 mL/min   GFR calc Af Amer >60 >60 mL/min    Comment: (NOTE) The eGFR has been calculated using the CKD EPI  equation. This calculation has not been validated in all clinical situations. eGFR's persistently <60 mL/min signify possible Chronic Kidney Disease.    Anion gap 2 (L) 5 - 15  CBC     Status: Abnormal   Collection Time: 12/30/16  7:10 AM  Result Value Ref Range   WBC 31.4 (H) 3.8 - 10.6 K/uL   RBC 2.90 (L) 4.40 - 5.90 MIL/uL   Hemoglobin 8.1 (L) 13.0 - 18.0 g/dL   HCT 24.0 (L) 40.0 - 52.0 %   MCV 82.7 80.0 - 100.0 fL   MCH 28.0 26.0 - 34.0 pg   MCHC 33.9 32.0 - 36.0 g/dL   RDW 15.8 (H) 11.5 - 14.5 %   Platelets 101 (L) 150 - 440 K/uL  Glucose, capillary     Status: Abnormal   Collection Time: 12/30/16  7:24 AM  Result Value Ref Range   Glucose-Capillary 118 (H) 65 - 99 mg/dL   Comment 1 Notify RN    Comment 2 Document in Chart   Glucose, capillary     Status: Abnormal   Collection Time: 12/30/16 11:20 AM  Result Value Ref Range   Glucose-Capillary 126 (H) 65 - 99 mg/dL   Comment 1 Notify RN    Comment 2 Document in Chart   Glucose, capillary     Status: Abnormal   Collection Time: 12/30/16  4:15 PM  Result Value Ref Range   Glucose-Capillary 121 (H) 65 - 99 mg/dL   Comment 1 Notify RN    Comment 2 Document in Chart   Glucose, capillary     Status: Abnormal   Collection Time: 12/30/16  9:12 PM  Result Value Ref Range   Glucose-Capillary 115 (H) 65 - 99 mg/dL  Glucose, capillary     Status: Abnormal   Collection Time: 12/31/16  3:58 AM  Result Value Ref Range   Glucose-Capillary 130 (H) 65 - 99 mg/dL  CBC     Status: Abnormal   Collection Time: 12/31/16  5:20 AM  Result Value Ref Range   WBC 21.8 (H) 3.8 - 10.6 K/uL   RBC 2.46 (L) 4.40 - 5.90 MIL/uL   Hemoglobin 7.1 (L) 13.0 - 18.0 g/dL   HCT 20.9 (L) 40.0 - 52.0 %   MCV 85.0 80.0 - 100.0 fL   MCH 28.9 26.0 - 34.0 pg   MCHC 34.1 32.0 - 36.0 g/dL   RDW 15.7 (H) 11.5 - 14.5 %   Platelets 115 (L) 150 - 440 K/uL  Basic metabolic panel     Status: Abnormal   Collection Time: 12/31/16  5:20 AM  Result Value Ref  Range   Sodium 141 135 - 145 mmol/L   Potassium 3.6 3.5 - 5.1 mmol/L   Chloride 111 101 - 111 mmol/L   CO2 24 22 - 32 mmol/L   Glucose, Bld 153 (H) 65 - 99 mg/dL   BUN 9 6 - 20 mg/dL   Creatinine, Ser 0.70 0.61 - 1.24 mg/dL   Calcium 8.1 (L) 8.9 - 10.3 mg/dL   GFR calc non Af Amer >60 >60 mL/min   GFR calc Af Amer >60 >60 mL/min    Comment: (NOTE) The eGFR has been calculated using the CKD EPI equation. This calculation has not been validated in all clinical situations. eGFR's persistently <60 mL/min signify possible Chronic Kidney Disease.    Anion gap 6 5 - 15  Glucose, capillary     Status: Abnormal   Collection Time: 12/31/16  5:23 AM  Result Value Ref Range   Glucose-Capillary 155 (H) 65 - 99 mg/dL  Glucose, capillary     Status: Abnormal   Collection Time: 12/31/16  7:50 AM  Result Value Ref Range   Glucose-Capillary 185 (H) 65 - 99 mg/dL  Prepare RBC     Status: None   Collection Time: 12/31/16  8:17 AM  Result Value Ref Range   Order Confirmation ORDER PROCESSED BY BLOOD BANK   Type and screen [place]     Status: None   Collection Time: 12/31/16  8:17 AM  Result Value Ref Range   ISSUE DATE / TIME 283662947654    Blood Product Unit Number [id number]    PRODUCT CODE [id number]    Unit Type and Rh  7300    Blood Product Expiration Date 448185631497    ISSUE DATE / TIME 026378588502    Blood Product Unit Number [id number]    PRODUCT CODE [id number]    Unit Type and Rh 1700    Blood Product Expiration Date 836629476546    ISSUE DATE / TIME 503546568127    Blood Product Unit Number [id number]    PRODUCT CODE [id number]    Unit Type and Rh 7300    Blood Product Expiration Date 665993570177    ISSUE DATE / TIME 939030092330    Blood Product Unit Number [id number]    PRODUCT CODE [id number]    Unit Type and Rh 5100    Blood Product Expiration Date 342876811572    ISSUE DATE / TIME 620355974163    Blood Product Unit Number [id number]     PRODUCT CODE [id number]    Unit Type and Rh 7300    Blood Product Expiration Date 704888916945    Blood Product Unit Number [id number]    Unit Type and Rh 7300    Blood Product Expiration Date 791505697948   Prepare RBC     Status: None   Collection Time: 12/31/16  8:17 AM  Result Value Ref Range   Order Confirmation ORDER PROCESSED BY BLOOD BANK   Glucose, capillary     Status: Abnormal   Collection Time: 12/31/16  8:52 AM  Result Value Ref Range   Glucose-Capillary 194 (H) 65 - 99 mg/dL  Hemoglobin     Status: Abnormal   Collection Time: 12/31/16  9:18 AM  Result Value Ref Range   Hemoglobin 6.0 (L) 13.0 - 18.0 g/dL  Glucose, capillary     Status: Abnormal   Collection Time: 12/31/16 11:29 AM  Result Value Ref Range   Glucose-Capillary 161 (H) 65 - 99 mg/dL  Glucose, capillary     Status: Abnormal   Collection Time: 12/31/16  4:15 PM  Result Value Ref Range   Glucose-Capillary 145 (H) 65 - 99 mg/dL  MRSA PCR Screening     Status: None   Collection Time: 12/31/16  4:16 PM  Result Value Ref Range   MRSA by PCR NEGATIVE NEGATIVE    Comment:        The GeneXpert MRSA Assay (FDA approved for NASAL specimens only), is one component of a comprehensive MRSA colonization surveillance program. It is not intended to diagnose MRSA infection nor to guide or monitor treatment for MRSA infections.   Hemoglobin     Status: Abnormal   Collection Time: 12/31/16  8:07 PM  Result Value Ref Range   Hemoglobin 8.4 (L) 13.0 - 18.0 g/dL    Comment: RESULT REPEATED AND VERIFIED  Glucose, capillary     Status: Abnormal   Collection Time: 12/31/16  8:24 PM  Result Value Ref Range   Glucose-Capillary 132 (H) 65 - 99 mg/dL   Comment 1 Notify RN   Glucose, capillary     Status: Abnormal   Collection Time: 12/31/16 11:42 PM  Result Value Ref Range   Glucose-Capillary 116 (H) 65 - 99 mg/dL  Glucose, capillary     Status: Abnormal   Collection Time: 01/01/17  4:21 AM  Result Value Ref  Range   Glucose-Capillary 131 (H) 65 - 99 mg/dL  CBC     Status: Abnormal   Collection Time: 01/01/17  5:55 AM  Result Value Ref Range   WBC 21.1 (H) 3.8 - 10.6 K/uL   RBC 2.76 (L) 4.40 - 5.90 MIL/uL   Hemoglobin  8.0 (L) 13.0 - 18.0 g/dL   HCT 23.7 (L) 40.0 - 52.0 %   MCV 85.8 80.0 - 100.0 fL   MCH 29.1 26.0 - 34.0 pg   MCHC 33.9 32.0 - 36.0 g/dL   RDW 14.9 (H) 11.5 - 14.5 %   Platelets 120 (L) 150 - 440 K/uL  Basic metabolic panel     Status: Abnormal   Collection Time: 01/01/17  5:55 AM  Result Value Ref Range   Sodium 138 135 - 145 mmol/L   Potassium 3.1 (L) 3.5 - 5.1 mmol/L   Chloride 108 101 - 111 mmol/L   CO2 27 22 - 32 mmol/L   Glucose, Bld 134 (H) 65 - 99 mg/dL   BUN 6 6 - 20 mg/dL   Creatinine, Ser 0.73 0.61 - 1.24 mg/dL   Calcium 8.0 (L) 8.9 - 10.3 mg/dL   GFR calc non Af Amer >60 >60 mL/min   GFR calc Af Amer >60 >60 mL/min    Comment: (NOTE) The eGFR has been calculated using the CKD EPI equation. This calculation has not been validated in all clinical situations. eGFR's persistently <60 mL/min signify possible Chronic Kidney Disease.    Anion gap 3 (L) 5 - 15  Magnesium     Status: None   Collection Time: 01/01/17  5:55 AM  Result Value Ref Range   Magnesium 1.7 1.7 - 2.4 mg/dL  Protime-INR     Status: None   Collection Time: 01/01/17  5:55 AM  Result Value Ref Range   Prothrombin Time 15.1 11.4 - 15.2 seconds   INR 1.18   Glucose, capillary     Status: Abnormal   Collection Time: 01/01/17  7:10 AM  Result Value Ref Range   Glucose-Capillary 129 (H) 65 - 99 mg/dL  Prepare RBC (crossmatch)     Status: None   Collection Time: 01/01/17 12:30 PM  Result Value Ref Range   Order Confirmation ORDER PROCESSED BY BLOOD BANK   Surgical pathology     Status: None   Collection Time: 01/01/17  1:12 PM  Result Value Ref Range   SURGICAL PATHOLOGY      Surgical Pathology CASE: [id number] PATIENT: Prudence Davidson Surgical Pathology Report     SPECIMEN  SUBMITTED: A. Colon, sigmoid B. Colon, distal descending  CLINICAL HISTORY: None provided  PRE-OPERATIVE DIAGNOSIS: N/A  POST-OPERATIVE DIAGNOSIS: None provided.     DIAGNOSIS: A. SIGMOID COLON; COLECTOMY: - FEATURES CONSISTENT WITH ISCHEMIC COLITIS. - NEGATIVE FOR DYSPLASIA AND MALIGNANCY. - THREE BENIGN LYMPH NODES. - FOREIGN INTRAVASCULAR MATERIAL, CONSISTENT WITH PRIOR EMBOLIZATION.  B. DISTAL DESCENDING COLON; COLECTOMY: - NO PATHOLOGIC CHANGE.   GROSS DESCRIPTION:  A. Labeled: sigmoid colon  Parts of bowel received: sigmoid colon  Measurement: 14.3 cm long and 4.3 cm in diameter and 2 donuts, 2.0 x 1.5 x 0.5 cm and 2.3 x 2.0 x 1.1 cm  Specimen Integrity (Describe any perforations): colon segment received intact  Orientation: radial-orange and remaining-blue  External surface: serosal tan with focal ecchymosis or moderate amount at tached mesocolon  Other remarkable findings: opening the colon reveals denuded focally ulcerated mucosa with surrounding nodularity (6.0 x 4.1 cm) that extends to 0 1 stapled end  Lymph nodes: identified  Block Summary: 1-2-representative donuts 3-4-representative denuded mucosa 5-representative vessels near denuded mucosa and lymph node 6-10-additional representative sections  B. Labeled: distal descending colon  Parts of bowel received: descending colon  Measurement: 4.3 cm long and 3 cm in diameter  Specimen Integrity (Describe any  perforations): one end open and one stapled  Orientation: not oriented  External surface: wrinkled pink mucosa with moderate attached mesocolon  Other remarkable findings: none noted  Lymph nodes: none identified  Block Summary: 1-en face open margin 2-en face stapled margin   Final Diagnosis performed by Delorse Lek, MD.  Electronically signed 01/04/2017 12:33:54PM    The electronic signature indicates that the named  Attending Pathologist has evaluated the  specimen  Technical component performed at [facility], [address] [place], [place] [address] Lab: [phone] Dir: Garo Heidelberg. Evette Doffing, MD  Professional component performed at [facility], [facility], [place], [place], [place] [address] Lab: [phone] Dir: Dellia Nims. Rubinas, MD    Glucose, capillary     Status: Abnormal   Collection Time: 01/01/17  3:11 PM  Result Value Ref Range   Glucose-Capillary 123 (H) 65 - 99 mg/dL  Glucose, capillary     Status: Abnormal   Collection Time: 01/01/17  7:36 PM  Result Value Ref Range   Glucose-Capillary 109 (H) 65 - 99 mg/dL  Glucose, capillary     Status: Abnormal   Collection Time: 01/01/17 11:54 PM  Result Value Ref Range   Glucose-Capillary 146 (H) 65 - 99 mg/dL  Glucose, capillary     Status: Abnormal   Collection Time: 01/02/17  4:05 AM  Result Value Ref Range   Glucose-Capillary 129 (H) 65 - 99 mg/dL  CBC with Differential/Platelet     Status: Abnormal   Collection Time: 01/02/17  4:38 AM  Result Value Ref Range   WBC 18.0 (H) 3.8 - 10.6 K/uL   RBC 2.84 (L) 4.40 - 5.90 MIL/uL   Hemoglobin 8.2 (L) 13.0 - 18.0 g/dL   HCT 24.4 (L) 40.0 - 52.0 %   MCV 86.0 80.0 - 100.0 fL   MCH 28.8 26.0 - 34.0 pg   MCHC 33.4 32.0 - 36.0 g/dL   RDW 15.1 (H) 11.5 - 14.5 %   Platelets 139 (L) 150 - 440 K/uL   Neutrophils Relative % 79 %   Lymphocytes Relative 9 %   Monocytes Relative 10 %   Eosinophils Relative 2 %   Basophils Relative 0 %   nRBC 7 (H) 0 /100 WBC   Neutro Abs 14.2 (H) 1.4 - 6.5 K/uL   Lymphs Abs 1.6 1.0 - 3.6 K/uL   Monocytes Absolute 1.8 (H) 0.2 - 1.0 K/uL   Eosinophils Absolute 0.4 0 - 0.7 K/uL   Basophils Absolute 0.0 0 - 0.1 K/uL   RBC Morphology MIXED RBC POPULATION     Comment: POLYCHROMASIA PRESENT  Basic metabolic panel     Status: Abnormal   Collection Time: 01/02/17  4:38 AM  Result Value Ref Range   Sodium 141 135 - 145 mmol/L   Potassium 3.1 (L) 3.5 - 5.1 mmol/L   Chloride 109 101 - 111  mmol/L   CO2 29 22 - 32 mmol/L   Glucose, Bld 123 (H) 65 - 99 mg/dL   BUN <5 (L) 6 - 20 mg/dL   Creatinine, Ser 0.60 (L) 0.61 - 1.24 mg/dL   Calcium 7.8 (L) 8.9 - 10.3 mg/dL   GFR calc non Af Amer >60 >60 mL/min   GFR calc Af Amer >60 >60 mL/min    Comment: (NOTE) The eGFR has been calculated using the CKD EPI equation. This calculation has not been validated in all clinical situations. eGFR's persistently <60 mL/min signify possible Chronic Kidney Disease.    Anion gap 3 (L) 5 -  15  Pathologist smear review     Status: None   Collection Time: 01/02/17  4:38 AM  Result Value Ref Range   Path Review      Blood smear reviewed. There is mild thrombocytopenia without clumping. There are numerous nucleated RBCs, and there is a prominent increase in polychromasia. Spherocytes are present. Neutrophils are increased with left shift including myelocytes. I note  the history of GI bleed, multiple transfusions, and partial colectomy. The hemoglobin appears stable at this time. The morphologic features suggest a component of hemolysis and raises concern for delayed hemolytic transfusion reaction. It would be  prudent to recheck a blood sample for unexpected antibodies and direct antiglobulin test.      Comment: Reviewed by Lemmie Evens. Dicie Beam, MD.  Glucose, capillary     Status: Abnormal   Collection Time: 01/02/17  7:44 AM  Result Value Ref Range   Glucose-Capillary 111 (H) 65 - 99 mg/dL  DAT, polyspecific, AHG ([facility] only)     Status: None   Collection Time: 01/02/17 10:31 AM  Result Value Ref Range   Polyspecific AHG test NEG   Type and screen Hold 2 units now and stay ahead 2 units at all times     Status: None   Collection Time: 01/02/17 10:31 AM  Result Value Ref Range   ABO/RH(D) B POS    Antibody Screen NEG    Sample Expiration 01/05/2017    Unit Number [id number]    Blood Component Type RED CELLS,LR    Unit division 00    Status of Unit REL FROM [facility]    Transfusion Status OK TO  TRANSFUSE    Crossmatch Result Compatible    Unit Number [id number]    Blood Component Type RED CELLS,LR    Unit division 00    Status of Unit ISSUED,FINAL    Transfusion Status OK TO TRANSFUSE    Crossmatch Result Compatible    Unit Number [id number]    Blood Component Type RED CELLS,LR    Unit division 00    Status of Unit ISSUED,FINAL    Transfusion Status OK TO TRANSFUSE    Crossmatch Result Compatible   Glucose, capillary     Status: Abnormal   Collection Time: 01/02/17 11:16 AM  Result Value Ref Range   Glucose-Capillary 104 (H) 65 - 99 mg/dL  Glucose, capillary     Status: Abnormal   Collection Time: 01/02/17  4:03 PM  Result Value Ref Range   Glucose-Capillary 104 (H) 65 - 99 mg/dL  Glucose, capillary     Status: Abnormal   Collection Time: 01/02/17  8:03 PM  Result Value Ref Range   Glucose-Capillary 130 (H) 65 - 99 mg/dL  Glucose, capillary     Status: Abnormal   Collection Time: 01/02/17 11:46 PM  Result Value Ref Range   Glucose-Capillary 149 (H) 65 - 99 mg/dL  Glucose, capillary     Status: Abnormal   Collection Time: 01/03/17  3:34 AM  Result Value Ref Range   Glucose-Capillary 138 (H) 65 - 99 mg/dL  CBC with Differential/Platelet     Status: Abnormal   Collection Time: 01/03/17  4:41 AM  Result Value Ref Range   WBC 17.5 (H) 3.8 - 10.6 K/uL   RBC 2.15 (L) 4.40 - 5.90 MIL/uL   Hemoglobin 6.2 (L) 13.0 - 18.0 g/dL   HCT 18.9 (L) 40.0 - 52.0 %   MCV 87.7 80.0 - 100.0 fL   MCH 28.7 26.0 - 34.0 pg  MCHC 32.7 32.0 - 36.0 g/dL   RDW 15.1 (H) 11.5 - 14.5 %   Platelets 160 150 - 440 K/uL   Neutrophils Relative % 79 %   Lymphocytes Relative 17 %   Monocytes Relative 2 %   Eosinophils Relative 2 %   Basophils Relative 0 %   Band Neutrophils 0 %   Metamyelocytes Relative 0 %   Myelocytes 0 %   Promyelocytes Absolute 0 %   Blasts 0 %   nRBC 14 (H) 0 /100 WBC   Other 0 %   Neutro Abs 13.7 (H) 1.4 - 6.5 K/uL   Lymphs Abs 3.0 1.0 - 3.6 K/uL   Monocytes  Absolute 0.4 0.2 - 1.0 K/uL   Eosinophils Absolute 0.4 0 - 0.7 K/uL   Basophils Absolute 0.0 0 - 0.1 K/uL   RBC Morphology MIXED RBC POPULATION     Comment: HYPOCHROMIA POLYCHROMASIA PRESENT   Basic metabolic panel     Status: Abnormal   Collection Time: 01/03/17  4:41 AM  Result Value Ref Range   Sodium 140 135 - 145 mmol/L   Potassium 3.5 3.5 - 5.1 mmol/L   Chloride 108 101 - 111 mmol/L   CO2 27 22 - 32 mmol/L   Glucose, Bld 128 (H) 65 - 99 mg/dL   BUN 8 6 - 20 mg/dL   Creatinine, Ser 0.77 0.61 - 1.24 mg/dL   Calcium 7.7 (L) 8.9 - 10.3 mg/dL   GFR calc non Af Amer >60 >60 mL/min   GFR calc Af Amer >60 >60 mL/min    Comment: (NOTE) The eGFR has been calculated using the CKD EPI equation. This calculation has not been validated in all clinical situations. eGFR's persistently <60 mL/min signify possible Chronic Kidney Disease.    Anion gap 5 5 - 15  Prepare RBC     Status: None   Collection Time: 01/03/17  7:00 AM  Result Value Ref Range   Order Confirmation ORDER PROCESSED BY BLOOD BANK   Glucose, capillary     Status: Abnormal   Collection Time: 01/03/17  7:45 AM  Result Value Ref Range   Glucose-Capillary 104 (H) 65 - 99 mg/dL  Glucose, capillary     Status: Abnormal   Collection Time: 01/03/17 11:55 AM  Result Value Ref Range   Glucose-Capillary 158 (H) 65 - 99 mg/dL  Glucose, capillary     Status: Abnormal   Collection Time: 01/03/17  5:12 PM  Result Value Ref Range   Glucose-Capillary 137 (H) 65 - 99 mg/dL  Hemoglobin and hematocrit, blood     Status: Abnormal   Collection Time: 01/03/17  6:34 PM  Result Value Ref Range   Hemoglobin 7.7 (L) 13.0 - 18.0 g/dL   HCT 23.2 (L) 40.0 - 52.0 %  Glucose, capillary     Status: Abnormal   Collection Time: 01/03/17  7:40 PM  Result Value Ref Range   Glucose-Capillary 162 (H) 65 - 99 mg/dL  Hemoglobin and hematocrit, blood     Status: Abnormal   Collection Time: 01/03/17 11:35 PM  Result Value Ref Range   Hemoglobin 7.3  (L) 13.0 - 18.0 g/dL   HCT 21.8 (L) 40.0 - 52.0 %  Glucose, capillary     Status: Abnormal   Collection Time: 01/03/17 11:45 PM  Result Value Ref Range   Glucose-Capillary 157 (H) 65 - 99 mg/dL  Glucose, capillary     Status: Abnormal   Collection Time: 01/04/17  4:28 AM  Result Value  Ref Range   Glucose-Capillary 121 (H) 65 - 99 mg/dL  CBC with Differential/Platelet     Status: Abnormal   Collection Time: 01/04/17  4:29 AM  Result Value Ref Range   WBC 15.8 (H) 3.8 - 10.6 K/uL   RBC 2.68 (L) 4.40 - 5.90 MIL/uL   Hemoglobin 7.8 (L) 13.0 - 18.0 g/dL   HCT 23.1 (L) 40.0 - 52.0 %   MCV 86.3 80.0 - 100.0 fL   MCH 29.0 26.0 - 34.0 pg   MCHC 33.6 32.0 - 36.0 g/dL   RDW 16.5 (H) 11.5 - 14.5 %   Platelets 192 150 - 440 K/uL   Neutrophils Relative % 81 %   Lymphocytes Relative 10 %   Monocytes Relative 5 %   Eosinophils Relative 0 %   Basophils Relative 0 %   Band Neutrophils 1 %   Metamyelocytes Relative 3 %   Myelocytes 0 %   Promyelocytes Absolute 0 %   Blasts 0 %   nRBC 11 (H) 0 /100 WBC   Other 0 %   Neutro Abs 13.4 (H) 1.4 - 6.5 K/uL   Lymphs Abs 1.6 1.0 - 3.6 K/uL   Monocytes Absolute 0.8 0.2 - 1.0 K/uL   Eosinophils Absolute 0.0 0 - 0.7 K/uL   Basophils Absolute 0.0 0 - 0.1 K/uL   RBC Morphology POLYCHROMASIA PRESENT     Comment: MIXED RBC POPULATION TARGET CELLS SPHEROCYTES   Basic metabolic panel     Status: Abnormal   Collection Time: 01/04/17  4:29 AM  Result Value Ref Range   Sodium 139 135 - 145 mmol/L   Potassium 3.8 3.5 - 5.1 mmol/L    Comment: HEMOLYSIS AT THIS LEVEL MAY AFFECT RESULT   Chloride 108 101 - 111 mmol/L   CO2 27 22 - 32 mmol/L   Glucose, Bld 105 (H) 65 - 99 mg/dL   BUN 8 6 - 20 mg/dL   Creatinine, Ser 0.65 0.61 - 1.24 mg/dL   Calcium 7.8 (L) 8.9 - 10.3 mg/dL   GFR calc non Af Amer >60 >60 mL/min   GFR calc Af Amer >60 >60 mL/min    Comment: (NOTE) The eGFR has been calculated using the CKD EPI equation. This calculation has not been  validated in all clinical situations. eGFR's persistently <60 mL/min signify possible Chronic Kidney Disease.    Anion gap 4 (L) 5 - 15  Magnesium     Status: None   Collection Time: 01/04/17  4:29 AM  Result Value Ref Range   Magnesium 1.8 1.7 - 2.4 mg/dL  Glucose, capillary     Status: Abnormal   Collection Time: 01/04/17  7:49 AM  Result Value Ref Range   Glucose-Capillary 129 (H) 65 - 99 mg/dL   Comment 1 Notify RN   Glucose, capillary     Status: Abnormal   Collection Time: 01/04/17 12:16 PM  Result Value Ref Range   Glucose-Capillary 200 (H) 65 - 99 mg/dL   Comment 1 Notify RN   Glucose, capillary     Status: Abnormal   Collection Time: 01/04/17  4:12 PM  Result Value Ref Range   Glucose-Capillary 143 (H) 65 - 99 mg/dL  Glucose, capillary     Status: Abnormal   Collection Time: 01/04/17  7:58 PM  Result Value Ref Range   Glucose-Capillary 132 (H) 65 - 99 mg/dL  Glucose, capillary     Status: Abnormal   Collection Time: 01/05/17 12:04 AM  Result Value Ref Range  Glucose-Capillary 132 (H) 65 - 99 mg/dL   Comment 1 Notify RN   Glucose, capillary     Status: Abnormal   Collection Time: 01/05/17  4:46 AM  Result Value Ref Range   Glucose-Capillary 126 (H) 65 - 99 mg/dL  CBC     Status: Abnormal   Collection Time: 01/05/17  5:27 AM  Result Value Ref Range   WBC 12.1 (H) 3.8 - 10.6 K/uL   RBC 2.46 (L) 4.40 - 5.90 MIL/uL   Hemoglobin 7.0 (L) 13.0 - 18.0 g/dL   HCT 21.3 (L) 40.0 - 52.0 %   MCV 86.4 80.0 - 100.0 fL   MCH 28.3 26.0 - 34.0 pg   MCHC 32.8 32.0 - 36.0 g/dL   RDW 15.9 (H) 11.5 - 14.5 %   Platelets 219 150 - 440 K/uL  Basic metabolic panel     Status: Abnormal   Collection Time: 01/05/17  5:27 AM  Result Value Ref Range   Sodium 138 135 - 145 mmol/L   Potassium 3.2 (L) 3.5 - 5.1 mmol/L   Chloride 105 101 - 111 mmol/L   CO2 28 22 - 32 mmol/L   Glucose, Bld 124 (H) 65 - 99 mg/dL   BUN 7 6 - 20 mg/dL   Creatinine, Ser 0.55 (L) 0.61 - 1.24 mg/dL   Calcium  8.0 (L) 8.9 - 10.3 mg/dL   GFR calc non Af Amer >60 >60 mL/min   GFR calc Af Amer >60 >60 mL/min    Comment: (NOTE) The eGFR has been calculated using the CKD EPI equation. This calculation has not been validated in all clinical situations. eGFR's persistently <60 mL/min signify possible Chronic Kidney Disease.    Anion gap 5 5 - 15  Glucose, capillary     Status: Abnormal   Collection Time: 01/05/17  7:47 AM  Result Value Ref Range   Glucose-Capillary 112 (H) 65 - 99 mg/dL  Glucose, capillary     Status: Abnormal   Collection Time: 01/05/17 11:31 AM  Result Value Ref Range   Glucose-Capillary 227 (H) 65 - 99 mg/dL  Glucose, capillary     Status: Abnormal   Collection Time: 01/05/17  4:38 PM  Result Value Ref Range   Glucose-Capillary 151 (H) 65 - 99 mg/dL  Glucose, capillary     Status: Abnormal   Collection Time: 01/05/17  8:52 PM  Result Value Ref Range   Glucose-Capillary 160 (H) 65 - 99 mg/dL   Comment 1 Notify RN   Glucose, capillary     Status: Abnormal   Collection Time: 01/06/17 12:20 AM  Result Value Ref Range   Glucose-Capillary 192 (H) 65 - 99 mg/dL   Comment 1 Notify RN   Glucose, capillary     Status: Abnormal   Collection Time: 01/06/17  4:46 AM  Result Value Ref Range   Glucose-Capillary 125 (H) 65 - 99 mg/dL  CBC     Status: Abnormal   Collection Time: 01/06/17  6:06 AM  Result Value Ref Range   WBC 9.8 3.8 - 10.6 K/uL   RBC 2.50 (L) 4.40 - 5.90 MIL/uL   Hemoglobin 7.2 (L) 13.0 - 18.0 g/dL   HCT 21.6 (L) 40.0 - 52.0 %   MCV 86.1 80.0 - 100.0 fL   MCH 28.6 26.0 - 34.0 pg   MCHC 33.2 32.0 - 36.0 g/dL   RDW 16.3 (H) 11.5 - 14.5 %   Platelets 261 150 - 440 K/uL  Glucose, capillary  Status: Abnormal   Collection Time: 01/06/17  7:39 AM  Result Value Ref Range   Glucose-Capillary 124 (H) 65 - 99 mg/dL  Glucose, capillary     Status: Abnormal   Collection Time: 01/06/17 11:37 AM  Result Value Ref Range   Glucose-Capillary 215 (H) 65 - 99 mg/dL   Urinalysis, Complete w Microscopic     Status: Abnormal   Collection Time: 01/07/17 12:36 AM  Result Value Ref Range   Color, Urine STRAW (A) YELLOW   APPearance CLEAR (A) CLEAR   Specific Gravity, Urine 1.003 (L) 1.005 - 1.030   pH 8.0 5.0 - 8.0   Glucose, UA >=500 (A) NEGATIVE mg/dL   Hgb urine dipstick NEGATIVE NEGATIVE   Bilirubin Urine NEGATIVE NEGATIVE   Ketones, ur NEGATIVE NEGATIVE mg/dL   Protein, ur 30 (A) NEGATIVE mg/dL   Nitrite NEGATIVE NEGATIVE   Leukocytes, UA NEGATIVE NEGATIVE   RBC / HPF 0-5 0 - 5 RBC/hpf   WBC, UA 0-5 0 - 5 WBC/hpf   Bacteria, UA RARE (A) NONE SEEN   Squamous Epithelial / LPF NONE SEEN NONE SEEN   Mucous PRESENT   Osmolality, urine     Status: Abnormal   Collection Time: 01/07/17 12:36 AM  Result Value Ref Range   Osmolality, Ur 243 (L) 300 - 900 mOsm/kg  CBC with Differential/Platelet     Status: Abnormal   Collection Time: 01/08/17  1:02 AM  Result Value Ref Range   WBC 14.0 (H) 3.8 - 10.6 K/uL   RBC 2.81 (L) 4.40 - 5.90 MIL/uL   Hemoglobin 7.7 (L) 13.0 - 18.0 g/dL   HCT 24.0 (L) 40.0 - 52.0 %   MCV 85.5 80.0 - 100.0 fL   MCH 27.4 26.0 - 34.0 pg   MCHC 32.0 32.0 - 36.0 g/dL   RDW 16.9 (H) 11.5 - 14.5 %   Platelets 397 150 - 440 K/uL   Neutrophils Relative % 82 %   Lymphocytes Relative 10 %   Monocytes Relative 6 %   Eosinophils Relative 2 %   Basophils Relative 0 %   Band Neutrophils 0 %   Metamyelocytes Relative 0 %   Myelocytes 0 %   Promyelocytes Absolute 0 %   Blasts 0 %   nRBC 4 (H) 0 /100 WBC   Other 0 %   Neutro Abs 11.5 (H) 1.4 - 6.5 K/uL   Lymphs Abs 1.4 1.0 - 3.6 K/uL   Monocytes Absolute 0.8 0.2 - 1.0 K/uL   Eosinophils Absolute 0.3 0 - 0.7 K/uL   Basophils Absolute 0.0 0 - 0.1 K/uL   RBC Morphology MIXED RBC POPULATION     Comment: HYPOCHROMIA MARKED POIKILOCYTOSIS   Comprehensive metabolic panel     Status: Abnormal   Collection Time: 01/08/17  1:02 AM  Result Value Ref Range   Sodium 138 135 - 145 mmol/L    Potassium 3.7 3.5 - 5.1 mmol/L   Chloride 103 101 - 111 mmol/L   CO2 28 22 - 32 mmol/L   Glucose, Bld 199 (H) 65 - 99 mg/dL   BUN 5 (L) 6 - 20 mg/dL   Creatinine, Ser 0.72 0.61 - 1.24 mg/dL   Calcium 9.1 8.9 - 10.3 mg/dL   Total Protein 5.5 (L) 6.5 - 8.1 g/dL   Albumin 2.6 (L) 3.5 - 5.0 g/dL   AST 26 15 - 41 U/L   ALT 17 17 - 63 U/L   Alkaline Phosphatase 55 38 - 126 U/L  Total Bilirubin 0.7 0.3 - 1.2 mg/dL   GFR calc non Af Amer >60 >60 mL/min   GFR calc Af Amer >60 >60 mL/min    Comment: (NOTE) The eGFR has been calculated using the CKD EPI equation. This calculation has not been validated in all clinical situations. eGFR's persistently <60 mL/min signify possible Chronic Kidney Disease.    Anion gap 7 5 - 15  Protime-INR     Status: None   Collection Time: 01/08/17  1:02 AM  Result Value Ref Range   Prothrombin Time 14.3 11.4 - 15.2 seconds   INR 1.11   Lipase, blood     Status: None   Collection Time: 01/08/17  1:02 AM  Result Value Ref Range   Lipase 48 11 - 51 U/L  Comprehensive metabolic panel     Status: Abnormal   Collection Time: 01/08/17  5:03 AM  Result Value Ref Range   Sodium 140 135 - 145 mmol/L   Potassium 3.7 3.5 - 5.1 mmol/L   Chloride 104 101 - 111 mmol/L   CO2 31 22 - 32 mmol/L   Glucose, Bld 161 (H) 65 - 99 mg/dL   BUN <5 (L) 6 - 20 mg/dL   Creatinine, Ser 0.75 0.61 - 1.24 mg/dL   Calcium 8.8 (L) 8.9 - 10.3 mg/dL   Total Protein 5.3 (L) 6.5 - 8.1 g/dL   Albumin 2.6 (L) 3.5 - 5.0 g/dL   AST 21 15 - 41 U/L   ALT 18 17 - 63 U/L   Alkaline Phosphatase 59 38 - 126 U/L   Total Bilirubin 0.8 0.3 - 1.2 mg/dL   GFR calc non Af Amer >60 >60 mL/min   GFR calc Af Amer >60 >60 mL/min    Comment: (NOTE) The eGFR has been calculated using the CKD EPI equation. This calculation has not been validated in all clinical situations. eGFR's persistently <60 mL/min signify possible Chronic Kidney Disease.    Anion gap 5 5 - 15  CBC     Status: Abnormal    Collection Time: 01/08/17  5:03 AM  Result Value Ref Range   WBC 14.8 (H) 3.8 - 10.6 K/uL   RBC 2.92 (L) 4.40 - 5.90 MIL/uL   Hemoglobin 7.9 (L) 13.0 - 18.0 g/dL   HCT 25.0 (L) 40.0 - 52.0 %   MCV 85.6 80.0 - 100.0 fL   MCH 27.2 26.0 - 34.0 pg   MCHC 31.8 (L) 32.0 - 36.0 g/dL   RDW 16.9 (H) 11.5 - 14.5 %   Platelets 360 150 - 440 K/uL  Urinalysis, Routine w reflex microscopic     Status: Abnormal   Collection Time: 01/08/17  5:47 AM  Result Value Ref Range   Color, Urine STRAW (A) YELLOW   APPearance CLEAR (A) CLEAR   Specific Gravity, Urine 1.005 1.005 - 1.030   pH 7.0 5.0 - 8.0   Glucose, UA 50 (A) NEGATIVE mg/dL   Hgb urine dipstick NEGATIVE NEGATIVE   Bilirubin Urine NEGATIVE NEGATIVE   Ketones, ur NEGATIVE NEGATIVE mg/dL   Protein, ur NEGATIVE NEGATIVE mg/dL   Nitrite NEGATIVE NEGATIVE   Leukocytes, UA NEGATIVE NEGATIVE  Glucose, capillary     Status: Abnormal   Collection Time: 01/08/17  8:06 AM  Result Value Ref Range   Glucose-Capillary 148 (H) 65 - 99 mg/dL  Glucose, capillary     Status: Abnormal   Collection Time: 01/08/17 11:38 AM  Result Value Ref Range   Glucose-Capillary 197 (H) 65 -  99 mg/dL  CULTURE, BLOOD (ROUTINE X 2) w Reflex to ID Panel     Status: None   Collection Time: 01/08/17  1:41 PM  Result Value Ref Range   Specimen Description BLOOD RIGHT AC    Special Requests      BOTTLES DRAWN AEROBIC AND ANAEROBIC AER 4ML ANA 5ML   Culture NO GROWTH 5 DAYS    Report Status 01/13/2017 FINAL   CULTURE, BLOOD (ROUTINE X 2) w Reflex to ID Panel     Status: None   Collection Time: 01/08/17  1:54 PM  Result Value Ref Range   Specimen Description BLOOD RIGHT HAND    Special Requests      BOTTLES DRAWN AEROBIC AND ANAEROBIC AER 9ML ANA 10ML   Culture NO GROWTH 5 DAYS    Report Status 01/13/2017 FINAL   Glucose, capillary     Status: Abnormal   Collection Time: 01/08/17  6:14 PM  Result Value Ref Range   Glucose-Capillary 201 (H) 65 - 99 mg/dL  Glucose,  capillary     Status: Abnormal   Collection Time: 01/08/17  8:57 PM  Result Value Ref Range   Glucose-Capillary 200 (H) 65 - 99 mg/dL   Comment 1 Notify RN   Comprehensive metabolic panel     Status: Abnormal   Collection Time: 01/09/17  6:21 AM  Result Value Ref Range   Sodium 138 135 - 145 mmol/L   Potassium 4.1 3.5 - 5.1 mmol/L   Chloride 101 101 - 111 mmol/L   CO2 32 22 - 32 mmol/L   Glucose, Bld 171 (H) 65 - 99 mg/dL   BUN 7 6 - 20 mg/dL   Creatinine, Ser 0.73 0.61 - 1.24 mg/dL   Calcium 8.5 (L) 8.9 - 10.3 mg/dL   Total Protein 5.1 (L) 6.5 - 8.1 g/dL   Albumin 2.3 (L) 3.5 - 5.0 g/dL   AST 14 (L) 15 - 41 U/L   ALT 15 (L) 17 - 63 U/L   Alkaline Phosphatase 51 38 - 126 U/L   Total Bilirubin 1.0 0.3 - 1.2 mg/dL   GFR calc non Af Amer >60 >60 mL/min   GFR calc Af Amer >60 >60 mL/min    Comment: (NOTE) The eGFR has been calculated using the CKD EPI equation. This calculation has not been validated in all clinical situations. eGFR's persistently <60 mL/min signify possible Chronic Kidney Disease.    Anion gap 5 5 - 15  CBC     Status: Abnormal   Collection Time: 01/09/17  6:21 AM  Result Value Ref Range   WBC 18.3 (H) 3.8 - 10.6 K/uL   RBC 2.76 (L) 4.40 - 5.90 MIL/uL   Hemoglobin 7.6 (L) 13.0 - 18.0 g/dL   HCT 23.7 (L) 40.0 - 52.0 %   MCV 85.8 80.0 - 100.0 fL   MCH 27.5 26.0 - 34.0 pg   MCHC 32.1 32.0 - 36.0 g/dL   RDW 17.1 (H) 11.5 - 14.5 %   Platelets 376 150 - 440 K/uL  Glucose, capillary     Status: Abnormal   Collection Time: 01/09/17  7:33 AM  Result Value Ref Range   Glucose-Capillary 159 (H) 65 - 99 mg/dL  Glucose, capillary     Status: Abnormal   Collection Time: 01/09/17 11:50 AM  Result Value Ref Range   Glucose-Capillary 152 (H) 65 - 99 mg/dL  Glucose, capillary     Status: Abnormal   Collection Time: 01/09/17  4:56 PM  Result  Value Ref Range   Glucose-Capillary 173 (H) 65 - 99 mg/dL  Glucose, capillary     Status: Abnormal   Collection Time: 01/09/17   9:32 PM  Result Value Ref Range   Glucose-Capillary 188 (H) 65 - 99 mg/dL   Comment 1 Notify RN   Basic metabolic panel     Status: Abnormal   Collection Time: 01/10/17  4:50 AM  Result Value Ref Range   Sodium 137 135 - 145 mmol/L   Potassium 3.8 3.5 - 5.1 mmol/L   Chloride 99 (L) 101 - 111 mmol/L   CO2 33 (H) 22 - 32 mmol/L   Glucose, Bld 149 (H) 65 - 99 mg/dL   BUN 7 6 - 20 mg/dL   Creatinine, Ser 0.65 0.61 - 1.24 mg/dL   Calcium 8.4 (L) 8.9 - 10.3 mg/dL   GFR calc non Af Amer >60 >60 mL/min   GFR calc Af Amer >60 >60 mL/min    Comment: (NOTE) The eGFR has been calculated using the CKD EPI equation. This calculation has not been validated in all clinical situations. eGFR's persistently <60 mL/min signify possible Chronic Kidney Disease.    Anion gap 5 5 - 15  CBC     Status: Abnormal   Collection Time: 01/10/17  4:50 AM  Result Value Ref Range   WBC 13.2 (H) 3.8 - 10.6 K/uL   RBC 2.66 (L) 4.40 - 5.90 MIL/uL   Hemoglobin 7.2 (L) 13.0 - 18.0 g/dL   HCT 22.3 (L) 40.0 - 52.0 %   MCV 84.1 80.0 - 100.0 fL   MCH 27.1 26.0 - 34.0 pg   MCHC 32.2 32.0 - 36.0 g/dL   RDW 16.8 (H) 11.5 - 14.5 %   Platelets 393 150 - 440 K/uL  Glucose, capillary     Status: Abnormal   Collection Time: 01/10/17  7:40 AM  Result Value Ref Range   Glucose-Capillary 151 (H) 65 - 99 mg/dL   Comment 1 Notify RN   Prepare RBC     Status: None   Collection Time: 01/10/17  9:48 AM  Result Value Ref Range   Order Confirmation ORDER PROCESSED BY BLOOD BANK   Type and screen [facility]     Status: None   Collection Time: 01/10/17  9:48 AM  Result Value Ref Range   ISSUE DATE / TIME [id number]    Blood Product Unit Number [id number]    PRODUCT CODE [id number]    Unit Type and Rh 7300    Blood Product Expiration Date [id number]   Glucose, capillary     Status: Abnormal   Collection Time: 01/10/17 11:41 AM  Result Value Ref Range   Glucose-Capillary 147 (H) 65 - 99 mg/dL    Comment 1 Notify RN   Glucose, capillary     Status: Abnormal   Collection Time: 01/10/17  4:35 PM  Result Value Ref Range   Glucose-Capillary 149 (H) 65 - 99 mg/dL   Comment 1 Notify RN   Basic metabolic panel     Status: Abnormal   Collection Time: 01/10/17  5:19 PM  Result Value Ref Range   Sodium 137 135 - 145 mmol/L   Potassium 3.6 3.5 - 5.1 mmol/L   Chloride 103 101 - 111 mmol/L   CO2 29 22 - 32 mmol/L   Glucose, Bld 142 (H) 65 - 99 mg/dL   BUN 6 6 - 20 mg/dL   Creatinine, Ser 0.61 0.61 - 1.24 mg/dL   Calcium  8.6 (L) 8.9 - 10.3 mg/dL   GFR calc non Af Amer >60 >60 mL/min   GFR calc Af Amer >60 >60 mL/min    Comment: (NOTE) The eGFR has been calculated using the CKD EPI equation. This calculation has not been validated in all clinical situations. eGFR's persistently <60 mL/min signify possible Chronic Kidney Disease.    Anion gap 5 5 - 15  CBC     Status: Abnormal   Collection Time: 01/10/17  5:19 PM  Result Value Ref Range   WBC 13.8 (H) 3.8 - 10.6 K/uL   RBC 3.14 (L) 4.40 - 5.90 MIL/uL   Hemoglobin 8.5 (L) 13.0 - 18.0 g/dL   HCT 26.8 (L) 40.0 - 52.0 %   MCV 85.4 80.0 - 100.0 fL   MCH 27.2 26.0 - 34.0 pg   MCHC 31.8 (L) 32.0 - 36.0 g/dL   RDW 16.5 (H) 11.5 - 14.5 %   Platelets 408 150 - 440 K/uL  Glucose, capillary     Status: Abnormal   Collection Time: 01/10/17  9:03 PM  Result Value Ref Range   Glucose-Capillary 183 (H) 65 - 99 mg/dL  Basic metabolic panel     Status: Abnormal   Collection Time: 01/11/17  5:27 AM  Result Value Ref Range   Sodium 139 135 - 145 mmol/L   Potassium 3.7 3.5 - 5.1 mmol/L   Chloride 106 101 - 111 mmol/L   CO2 29 22 - 32 mmol/L   Glucose, Bld 196 (H) 65 - 99 mg/dL   BUN 6 6 - 20 mg/dL   Creatinine, Ser 0.60 (L) 0.61 - 1.24 mg/dL   Calcium 8.5 (L) 8.9 - 10.3 mg/dL   GFR calc non Af Amer >60 >60 mL/min   GFR calc Af Amer >60 >60 mL/min    Comment: (NOTE) The eGFR has been calculated using the CKD EPI equation. This calculation  has not been validated in all clinical situations. eGFR's persistently <60 mL/min signify possible Chronic Kidney Disease.    Anion gap 4 (L) 5 - 15  CBC     Status: Abnormal   Collection Time: 01/11/17  5:27 AM  Result Value Ref Range   WBC 10.4 3.8 - 10.6 K/uL   RBC 2.78 (L) 4.40 - 5.90 MIL/uL   Hemoglobin 7.6 (L) 13.0 - 18.0 g/dL   HCT 23.2 (L) 40.0 - 52.0 %   MCV 83.3 80.0 - 100.0 fL   MCH 27.5 26.0 - 34.0 pg   MCHC 33.0 32.0 - 36.0 g/dL   RDW 16.3 (H) 11.5 - 14.5 %   Platelets 428 150 - 440 K/uL  Glucose, capillary     Status: Abnormal   Collection Time: 01/11/17  7:50 AM  Result Value Ref Range   Glucose-Capillary 117 (H) 65 - 99 mg/dL   Comment 1 Notify RN   Glucose, capillary     Status: Abnormal   Collection Time: 01/11/17 12:09 PM  Result Value Ref Range   Glucose-Capillary 140 (H) 65 - 99 mg/dL   Comment 1 Notify RN   Glucose, capillary     Status: Abnormal   Collection Time: 01/11/17  5:15 PM  Result Value Ref Range   Glucose-Capillary 133 (H) 65 - 99 mg/dL   Comment 1 Notify RN   Glucose, capillary     Status: Abnormal   Collection Time: 01/11/17  9:30 PM  Result Value Ref Range   Glucose-Capillary 148 (H) 65 - 99 mg/dL  Basic metabolic panel  Status: Abnormal   Collection Time: 01/12/17  6:41 AM  Result Value Ref Range   Sodium 139 135 - 145 mmol/L   Potassium 3.7 3.5 - 5.1 mmol/L   Chloride 107 101 - 111 mmol/L   CO2 27 22 - 32 mmol/L   Glucose, Bld 116 (H) 65 - 99 mg/dL   BUN 7 6 - 20 mg/dL   Creatinine, Ser 0.59 (L) 0.61 - 1.24 mg/dL   Calcium 8.5 (L) 8.9 - 10.3 mg/dL   GFR calc non Af Amer >60 >60 mL/min   GFR calc Af Amer >60 >60 mL/min    Comment: (NOTE) The eGFR has been calculated using the CKD EPI equation. This calculation has not been validated in all clinical situations. eGFR's persistently <60 mL/min signify possible Chronic Kidney Disease.    Anion gap 5 5 - 15  CBC     Status: Abnormal   Collection Time: 01/12/17  6:41 AM   Result Value Ref Range   WBC 9.8 3.8 - 10.6 K/uL   RBC 2.93 (L) 4.40 - 5.90 MIL/uL   Hemoglobin 7.8 (L) 13.0 - 18.0 g/dL   HCT 24.2 (L) 40.0 - 52.0 %   MCV 82.3 80.0 - 100.0 fL   MCH 26.4 26.0 - 34.0 pg   MCHC 32.1 32.0 - 36.0 g/dL   RDW 16.9 (H) 11.5 - 14.5 %   Platelets 484 (H) 150 - 440 K/uL  Glucose, capillary     Status: Abnormal   Collection Time: 01/12/17  8:12 AM  Result Value Ref Range   Glucose-Capillary 121 (H) 65 - 99 mg/dL   Comment 1 Notify RN   Glucose, capillary     Status: Abnormal   Collection Time: 01/12/17 11:57 AM  Result Value Ref Range   Glucose-Capillary 190 (H) 65 - 99 mg/dL   Comment 1 Notify RN   Glucose, capillary     Status: Abnormal   Collection Time: 01/12/17  4:57 PM  Result Value Ref Range   Glucose-Capillary 180 (H) 65 - 99 mg/dL   Comment 1 Notify RN   Glucose, capillary     Status: Abnormal   Collection Time: 01/12/17  9:24 PM  Result Value Ref Range   Glucose-Capillary 120 (H) 65 - 99 mg/dL  Hemoglobin and hematocrit, blood     Status: Abnormal   Collection Time: 01/13/17  5:33 AM  Result Value Ref Range   Hemoglobin 8.1 (L) 13.0 - 18.0 g/dL   HCT 24.6 (L) 40.0 - 52.0 %  Glucose, capillary     Status: Abnormal   Collection Time: 01/13/17  8:00 AM  Result Value Ref Range   Glucose-Capillary 145 (H) 65 - 99 mg/dL   Comment 1 Notify RN   Glucose, capillary     Status: Abnormal   Collection Time: 01/13/17 11:41 AM  Result Value Ref Range   Glucose-Capillary 200 (H) 65 - 99 mg/dL   Comment 1 Notify RN   Glucose, capillary     Status: Abnormal   Collection Time: 01/13/17  4:37 PM  Result Value Ref Range   Glucose-Capillary 143 (H) 65 - 99 mg/dL   Comment 1 Notify RN   Glucose, capillary     Status: Abnormal   Collection Time: 01/13/17  9:03 PM  Result Value Ref Range   Glucose-Capillary 136 (H) 65 - 99 mg/dL  Glucose, capillary     Status: Abnormal   Collection Time: 01/14/17  7:26 AM  Result Value Ref Range   Glucose-Capillary  134 (H) 65 - 99 mg/dL   Comment 1 Notify RN   Glucose, capillary     Status: Abnormal   Collection Time: 01/14/17 11:35 AM  Result Value Ref Range   Glucose-Capillary 213 (H) 65 - 99 mg/dL   Comment 1 Notify RN   Glucose, capillary     Status: None   Collection Time: 01/14/17  4:48 PM  Result Value Ref Range   Glucose-Capillary 89 65 - 99 mg/dL   Comment 1 Notify RN   Glucose, capillary     Status: Abnormal   Collection Time: 01/14/17  9:30 PM  Result Value Ref Range   Glucose-Capillary 112 (H) 65 - 99 mg/dL  Creatinine, serum     Status: Abnormal   Collection Time: 01/15/17  4:38 AM  Result Value Ref Range   Creatinine, Ser 0.56 (L) 0.61 - 1.24 mg/dL   GFR calc non Af Amer >60 >60 mL/min   GFR calc Af Amer >60 >60 mL/min    Comment: (NOTE) The eGFR has been calculated using the CKD EPI equation. This calculation has not been validated in all clinical situations. eGFR's persistently <60 mL/min signify possible Chronic Kidney Disease.   Glucose, capillary     Status: Abnormal   Collection Time: 01/15/17  7:33 AM  Result Value Ref Range   Glucose-Capillary 125 (H) 65 - 99 mg/dL  Glucose, capillary     Status: Abnormal   Collection Time: 01/15/17  8:47 AM  Result Value Ref Range   Glucose-Capillary 126 (H) 65 - 99 mg/dL  Glucose, capillary     Status: Abnormal   Collection Time: 01/15/17 11:45 AM  Result Value Ref Range   Glucose-Capillary 230 (H) 65 - 99 mg/dL  Glucose, capillary     Status: Abnormal   Collection Time: 01/15/17  1:03 PM  Result Value Ref Range   Glucose-Capillary 149 (H) 65 - 99 mg/dL  Glucose, capillary     Status: Abnormal   Collection Time: 01/15/17  4:36 PM  Result Value Ref Range   Glucose-Capillary 114 (H) 65 - 99 mg/dL    Radiology Dg Chest 1 View  Result Date: 01/09/2017 CLINICAL DATA:  Wheezing EXAM: CHEST 1 VIEW COMPARISON:  01/04/2017 FINDINGS: Cardiomediastinal silhouette is stable. No infiltrate or pleural effusion. No pulmonary edema.  Degenerative changes mid and lower thoracic spine. IMPRESSION: No active disease. Electronically Signed   By: Lahoma Crocker M.D.   On: 01/09/2017 12:17   Dg Chest 2 View  Result Date: 01/14/2017 CLINICAL DATA:  Cough, former smoker EXAM: CHEST  2 VIEW COMPARISON:  01/09/2017 FINDINGS: Cardiomediastinal silhouette is stable. There is streaky atelectasis or infiltrate in right middle lobe. No pulmonary edema. Degenerative changes thoracic spine. IMPRESSION: No pulmonary edema. Streaky atelectasis or infiltrate in right middle lobe. Electronically Signed   By: Lahoma Crocker M.D.   On: 01/14/2017 12:00   US Venous Img Lower Bilateral  Result Date: 01/09/2017 CLINICAL DATA:  Swelling x7 days EXAM: BILATERAL LOWER EXTREMITY VENOUS DOPPLER ULTRASOUND TECHNIQUE: Gray-scale sonography with compression, as well as color and duplex ultrasound, were performed to evaluate the deep venous system from the level of the common femoral vein through the popliteal and proximal calf veins. COMPARISON:  None FINDINGS: On the right, Normal compressibility of the common femoral, superficial femoral, and popliteal veins, as well as the proximal calf veins. No filling defects to suggest DVT on grayscale or color Doppler imaging. Doppler waveforms show normal direction of venous flow, normal respiratory phasicity and  response to augmentation. There is thrombosis of a gastrocnemius vein with noncompressibility and no color Doppler signal. 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 One moiety is noncompressible with no color Doppler signal. Normal compressibility of common femoral, deep femoral, and popliteal veins as well was visualized calf veins. There is decreased response to augmentation in the popliteal vein. IMPRESSION: 1. 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 2. Isolated right gastrocnemius vein (calf) DVT. Electronically Signed   By: Lucrezia Europe M.D.   On: 01/09/2017 10:32      Assessment/Plan  S/P partial colectomy For diverticular bleeding which did not respond to embolization. Bleeding has been stopped.  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 Also, involved in wound healing. On appropriate medications.   Hypertension blood pressure control important in reducing the progression of atherosclerotic disease. On appropriate oral medications.  DVT (deep venous thrombosis) (HCC) His duplex today shows subacute appearing calf vein DVT on the right as well as the left. 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 We will continue his current therapy with aspirin. He will continue to elevate his legs, use compression stockings as needed for swelling, and increase his activity. I will recheck him in 3-4 months.    Leotis Pain, MD  02/05/2017 9:21 AM    This note was created with Dragon medical transcription system.  Any errors from dictation are purely unintentional

## 2017-02-05 NOTE — Assessment & Plan Note (Signed)
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 We will continue his current therapy with aspirin. He will continue to elevate his legs, use compression stockings as needed for swelling, and increase his activity. I will recheck him in 3-4 months.

## 2017-02-06 ENCOUNTER — Encounter: Payer: Self-pay | Admitting: Surgery

## 2017-02-06 ENCOUNTER — Ambulatory Visit (INDEPENDENT_AMBULATORY_CARE_PROVIDER_SITE_OTHER): Payer: Medicare Other | Admitting: Surgery

## 2017-02-06 VITALS — BP 150/84 | HR 82 | Temp 97.8°F | Wt 281.0 lb

## 2017-02-06 DIAGNOSIS — Z9049 Acquired absence of other specified parts of digestive tract: Secondary | ICD-10-CM

## 2017-02-06 NOTE — Progress Notes (Signed)
Outpatient postop visit  02/06/2017  Evan [name] is an 72 y.o. male.    Procedure: Sigmoid colon resection for bleeding, repair of evisceration  CC: Patient has no complaints at this time is eating well having no further bleeding symptoms.  HPI: This a patient who underwent a sigmoid colon resection for bleeding emergently who then eviscerated and required repair with retention sutures. He also experienced a deep venous thrombosis while in the hospital. He is not being treated with anticoagulation at this time and is seen Dr. dew for vascular. He had labs drawn recently that showed a hemoglobin of 10.8 and hematocrit of 34.  Medications reviewed.    Physical Exam:  There were no vitals taken for this visit.    PE: Morbidly obese male patient Wounds healing well no erythema no drainage abdomen is soft nontender retention sutures are in place nonerythematous and loosely approximated Nontender calves     Assessment/Plan:  Patient doing very well. He is seeing an ophthalmologist for his eye on the left. He is also seeing vascular. He is not currently on anticoagulation. His wounds healing well. I would recommend that the retention sutures stay for a total of 6 weeks and I will see him at the end of the month personally and remove those retention sutures.  Florene Glen, MD, FACS

## 2017-02-06 NOTE — Patient Instructions (Signed)
Please tell your Home Health Nurse that you are okay to take Miralax 17G daily to help with your constipation.  Please continue to change your dressing once a day.  We will see you in two weeks so we could removed your sutures.  You are able to do stationary bike and walking but nothing else.  Please call our office with any questions or concerns.  Please do not submerge in a tub, hot tub, or pool until incisions are completely sealed.  Use sun block to incision area over the next year if this area will be exposed to sun. This helps decrease scarring.  Listen to your body when lifting, if you have pain when lifting, stop and then try again in a few days. Pain after doing exercises or activities of daily living is normal as you get back in to your normal routine.  If you develop redness, drainage, or pain at incision sites- call our office immediately and speak with a nurse.

## 2017-02-20 ENCOUNTER — Telehealth: Payer: Self-pay

## 2017-02-20 ENCOUNTER — Encounter: Payer: Self-pay | Admitting: Surgery

## 2017-02-20 ENCOUNTER — Ambulatory Visit (INDEPENDENT_AMBULATORY_CARE_PROVIDER_SITE_OTHER): Payer: Medicare Other | Admitting: Surgery

## 2017-02-20 VITALS — BP 156/87 | HR 79 | Temp 97.7°F | Ht 75.0 in | Wt 285.5 lb

## 2017-02-20 DIAGNOSIS — T8131XD Disruption of external operation (surgical) wound, not elsewhere classified, subsequent encounter: Secondary | ICD-10-CM

## 2017-02-20 NOTE — Progress Notes (Signed)
Outpatient postop visit  02/20/2017  Evan [name] is an 72 y.o. male.    Procedure: Colon resection, closure of evisceration  CC: Feels well  HPI: This patient status post colon resection for bleeding followed by repair of evisceration with retention sutures. He is approximately 7 weeks out from his secondary closure. He has no complaints at this time.  Medications reviewed.    Physical Exam:  BP (!) 156/87   Pulse 79   Temp 97.7 F (36.5 C) (Oral)   Ht 6\' 3"  (1.905 m)   Wt 285 lb 8 oz (129.5 kg)   BMI 35.69 kg/m     PE: Wounds healing well no erythema no drainage Retention sutures are removed at this point.    Assessment/Plan:  Patient doing very well retention sutures of been removed he'll follow up on an as-needed basis asked him to do no heavy lifting for another 2 weeks.  Florene Glen, MD, FACS

## 2017-02-20 NOTE — Telephone Encounter (Signed)
Spoke with Evan [name] at [facility] to discontinue services at this time per Dr.Cooper. She stated she would have nurse call back for discontinuation information.  Patient was released to day to PRN status.

## 2017-02-20 NOTE — Patient Instructions (Signed)
We will cancel home health care. Please no heavy lifting for 2 more weeks. Please call our office if you have any questions or concerns.

## 2017-06-11 ENCOUNTER — Ambulatory Visit (INDEPENDENT_AMBULATORY_CARE_PROVIDER_SITE_OTHER): Payer: Medicare Other | Admitting: Vascular Surgery

## 2017-06-11 ENCOUNTER — Encounter (INDEPENDENT_AMBULATORY_CARE_PROVIDER_SITE_OTHER): Payer: Self-pay | Admitting: Vascular Surgery

## 2017-06-11 ENCOUNTER — Ambulatory Visit (INDEPENDENT_AMBULATORY_CARE_PROVIDER_SITE_OTHER): Payer: Medicare Other

## 2017-06-11 VITALS — BP 164/89 | HR 65 | Resp 16 | Wt 286.0 lb

## 2017-06-11 DIAGNOSIS — I82441 Acute embolism and thrombosis of right tibial vein: Secondary | ICD-10-CM

## 2017-06-11 DIAGNOSIS — E1121 Type 2 diabetes mellitus with diabetic nephropathy: Secondary | ICD-10-CM

## 2017-06-11 DIAGNOSIS — I82543 Chronic embolism and thrombosis of tibial vein, bilateral: Secondary | ICD-10-CM | POA: Diagnosis not present

## 2017-06-11 DIAGNOSIS — E7800 Pure hypercholesterolemia, unspecified: Secondary | ICD-10-CM | POA: Diagnosis not present

## 2017-06-11 NOTE — Progress Notes (Signed)
Subjective:    Patient ID: Evan [name], male    DOB: 1945-06-13, 72 y.o.   MRN: [id number] Chief Complaint  Patient presents with  . Follow-up    Patient last seen in February 2018. He presents today to review vascular studies. The patient is without complaint today. The patient denies any pain or edema lower extremity. The patient denies any shortness of breath or chest pain. The patient is remaining active and works out on a regular basis. The patient underwent a bilateral large of a venous duplex which was notable for subacute to chronic, nonobstructive deep vein thrombosis in one of the right proximal medial calf gastrocnemius veins. This is relatively stable compared to his prior DVT exam in February 2018. Duplex notable for resolved thrombus in the duplicate left femoral vein mid to distal thigh region. Resolved thrombus in 1 of the left proximal, mid calf peroneal veins with a patent perforator vein at this level. No superficial vein thrombophlebitis bilaterally. The patient denies any fever, nausea or vomiting.   Review of Systems  Constitutional: Negative.   HENT: Negative.   Eyes: Negative.   Respiratory: Negative.   Cardiovascular: Negative.   Gastrointestinal: Negative.   Endocrine: Negative.   Genitourinary: Negative.   Musculoskeletal: Negative.   Skin: Negative.   Allergic/Immunologic: Negative.   Neurological: Negative.   Hematological: Negative.   Psychiatric/Behavioral: Negative.       Objective:   Physical Exam  Constitutional: He is oriented to person, place, and time. He appears well-developed and well-nourished. No distress.  HENT:  Head: Normocephalic and atraumatic.  Eyes: Conjunctivae are normal. Pupils are equal, round, and reactive to light.  Neck: Normal range of motion.  Cardiovascular: Normal rate, regular rhythm, normal heart sounds and intact distal pulses.   Pulses:      Radial pulses are 2+ on the right side, and 2+ on the left side.   Dorsalis pedis pulses are 2+ on the right side, and 2+ on the left side.       Posterior tibial pulses are 2+ on the right side, and 2+ on the left side.  Pulmonary/Chest: Effort normal.  Musculoskeletal: Normal range of motion. He exhibits no edema.  Neurological: He is alert and oriented to person, place, and time.  Skin: Skin is warm and dry. He is not diaphoretic.  Psychiatric: He has a normal mood and affect. His behavior is normal. Judgment and thought content normal.  Vitals reviewed.   BP (!) 164/89   Pulse 65   Resp 16   Wt 286 lb (129.7 kg)   BMI 35.75 kg/m   Past Medical History:  Diagnosis Date  . Atrial fibrillation ([place])   . BPH (benign prostatic hyperplasia)   . CHF (congestive heart failure) ([place])   . Diabetes mellitus without complication ([place])   . Diverticulosis   . Hyperlipidemia   . Hypertension   . Sleep apnea     Social History   Social History  . Marital status: Married    Spouse name: N/A  . Number of children: N/A  . Years of education: N/A   Occupational History  . Not on file.   Social History Main Topics  . Smoking status: Former Smoker    Packs/day: 0.25    Years: 29.00    Types: Cigarettes    Quit date: 12/31/1993  . Smokeless tobacco: Never Used  . Alcohol use No  . Drug use: No  . Sexual activity: Not on file   Other  Topics Concern  . Not on file   Social History Narrative  . No narrative on file    Past Surgical History:  Procedure Laterality Date  . ATRIAL ABLATION SURGERY    . CHOLECYSTECTOMY    . LAPAROTOMY  01/01/2017   Procedure: EXPLORATORY LAPAROTOMY;  Surgeon: Olean Ree, MD;  Location: [facility] ORS;  Service: General;;  . PARTIAL COLECTOMY N/A 01/01/2017   Procedure: PARTIAL COLECTOMY;  Surgeon: Olean Ree, MD;  Location: [facility] ORS;  Service: General;  Laterality: N/A;  . PERIPHERAL VASCULAR CATHETERIZATION N/A 12/28/2016   Procedure: Visceral Artery Intervention;  Surgeon: Algernon Huxley, MD;  Location: [place]  [facility];  Service: Cardiovascular;  Laterality: N/A;  . SECONDARY CLOSURE OF WOUND N/A 01/08/2017   Procedure: SECONDARY CLOSURE FOR EVISCERATION;  Surgeon: Florene Glen, MD;  Location: [facility] ORS;  Service: General;  Laterality: N/A;  . TOTAL KNEE ARTHROPLASTY Right     Family History  Problem Relation Age of Onset  . Diabetes Sister   . Diabetes Brother   . Diabetes Sister   . Diabetes Brother     Allergies  Allergen Reactions  . Azithromycin Rash  . Penicillins Rash    Has patient had a PCN reaction causing immediate rash, facial/tongue/throat swelling, SOB or lightheadedness with hypotension: Yes Has patient had a PCN reaction causing severe rash involving mucus membranes or skin necrosis: Yes Has patient had a PCN reaction that required hospitalization No Has patient had a PCN reaction occurring within the last 10 years: No If all of the above answers are "NO", then may proceed with Cephalosporin use. Has patient had a PCN reaction causing immediate rash, facial/tongue/throat swelling, SOB or lightheadedness with hypotension: Yes Has patient had a PCN reaction causing severe rash involving mucus membranes or skin necrosis: Yes Has patient had a PCN reaction that required hospitalization No Has patient had a PCN reaction occurring within the last 10 years: No If all of the above answers are "NO", then may proceed with Cephalosporin use.   [name] Lisinopril Cough  . Losartan Cough  . Metoprolol Other (See Comments)    Bradycardia.       Assessment & Plan:  Patient last seen in February 2018. He presents today to review vascular studies. The patient is without complaint today. The patient denies any pain or edema lower extremity. The patient denies any shortness of breath or chest pain. The patient is remaining active and works out on a regular basis. The patient underwent a bilateral large of a venous duplex which was notable for subacute to chronic, nonobstructive deep vein  thrombosis in one of the right proximal medial calf gastrocnemius veins. This is relatively stable compared to his prior DVT exam in February 2018. Duplex notable for resolved thrombus in the duplicate left femoral vein mid to distal thigh region. Resolved thrombus in 1 of the left proximal, mid calf peroneal veins with a patent perforator vein at this level. No superficial vein thrombophlebitis bilaterally. The patient denies any fever, nausea or vomiting.  1. 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 Patient with improvement on duplex. Two DVT's seen on previous venous duplex have resolved. There is a chronic appearing DVT in the right proximal medial calf located in the gastrocnemius veins Patient to continue aspirin Patient to continue wearing compression stockings and engaging in elevation Patient to follow up in 3 months to assess DVT status  -  VAS [place] LOWER EXTREMITY VENOUS (DVT); Future  2. 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 Hypercholesteremia - stable Encouraged good control as its slows the progression of atherosclerotic disease   Current Outpatient Prescriptions on File Prior to Visit  Medication Sig Dispense Refill  . acetaminophen (TYLENOL) 500 MG tablet Take 1,000 mg by mouth every 6 (six) hours as needed.    [name] amLODipine (NORVASC) 5 MG tablet Take 5 mg by mouth daily.    [name] aspirin EC 81 MG EC tablet Take 1 tablet (81 mg total) by mouth daily. 30 tablet 5  . Cholecalciferol (VITAMIN D3) 2000 units capsule Take 2,000 Units by mouth daily.    [name] docusate calcium (SURFAK) 240 MG capsule Take 1 capsule by mouth daily.    . dorzolamide-timolol (COSOPT) 22.3-6.8 MG/ML ophthalmic solution Apply 1 drop to eye 2 (two) times daily.    . ferrous gluconate  (FERGON) 324 MG tablet Take 1 tablet (324 mg total) by mouth 2 (two) times daily with a meal. 60 tablet 5  . ferrous gluconate (FERGON) 324 MG tablet Take by mouth.    . latanoprost (XALATAN) 0.005 % ophthalmic solution Apply 1 drop to eye at bedtime.    . metFORMIN (GLUCOPHAGE) 1000 MG tablet Take 1,000 mg by mouth daily with breakfast.     . Multiple Vitamins-Minerals (MULTIVITAMIN ADULT PO) Take 1 tablet by mouth daily.    . Omega-3 Fatty Acids (FISH OIL) 1200 MG CAPS Take 2 capsules by mouth daily.    . pravastatin (PRAVACHOL) 40 MG tablet Take 40 mg by mouth daily.     No current facility-administered medications on file prior to visit.     There are no Patient Instructions on file for this visit. No Follow-up on file.   KIMBERLY A STEGMAYER, PA-C

## 2017-09-17 ENCOUNTER — Ambulatory Visit (INDEPENDENT_AMBULATORY_CARE_PROVIDER_SITE_OTHER): Payer: Medicare Other

## 2017-09-17 ENCOUNTER — Ambulatory Visit (INDEPENDENT_AMBULATORY_CARE_PROVIDER_SITE_OTHER): Payer: Medicare Other | Admitting: Vascular Surgery

## 2017-09-17 ENCOUNTER — Encounter (INDEPENDENT_AMBULATORY_CARE_PROVIDER_SITE_OTHER): Payer: Self-pay | Admitting: Vascular Surgery

## 2017-09-17 VITALS — BP 152/80 | HR 74 | Resp 17 | Wt 294.8 lb

## 2017-09-17 DIAGNOSIS — I82543 Chronic embolism and thrombosis of tibial vein, bilateral: Secondary | ICD-10-CM

## 2017-09-17 DIAGNOSIS — E1121 Type 2 diabetes mellitus with diabetic nephropathy: Secondary | ICD-10-CM

## 2017-09-17 DIAGNOSIS — I10 Essential (primary) hypertension: Secondary | ICD-10-CM

## 2017-09-17 NOTE — Assessment & Plan Note (Signed)
His duplex shows resolution without recurrent DVT at this time. His postphlebitic symptoms are really improved and not bothersome at this time. Wear stockings as needed for pain or swelling. Return to clinic on an as-needed basis.

## 2017-09-17 NOTE — Progress Notes (Signed)
MRN : [id number]  Evan [name] is a 72 y.o. ([birth year]) male who presents with chief complaint of  Chief Complaint  Patient presents with  . Follow-up    26mo ble ven dvt  .  History of Present Illness: Patient returns today in follow up of DVT. He is doing well. He denies any current pain or swelling. He is completed his course of anticoagulation and has now been on aspirin for the past few months. His duplex today shows no evidence of residual or recurrent DVT in either lower extremity.         Current Outpatient Prescriptions  Medication Sig Dispense Refill  . acetaminophen (TYLENOL) 500 MG tablet Take 1,000 mg by mouth every 6 (six) hours as needed.    [name] amLODipine (NORVASC) 5 MG tablet Take 5 mg by mouth daily.    [name] aspirin EC 81 MG EC tablet Take 1 tablet (81 mg total) by mouth daily. 30 tablet 5  . BAYER CONTOUR TEST test strip USE TO TEST BLOOD SUGAR QD UTD  3  . Cholecalciferol (VITAMIN D3) 2000 units capsule Take 2,000 Units by mouth daily.    [name] docusate calcium (SURFAK) 240 MG capsule Take 1 capsule by mouth daily.    . dorzolamide (TRUSOPT) 2 % ophthalmic solution Place 1 drop into both eyes 2 (two) times daily. 10 mL 12  . dorzolamide-timolol (COSOPT) 22.3-6.8 MG/ML ophthalmic solution Apply 1 drop to eye 2 (two) times daily.    . feeding supplement (BOOST / RESOURCE BREEZE) LIQD Take 1 Container by mouth 3 (three) times daily between meals. 90 Container 5  . ferrous gluconate (FERGON) 324 MG tablet Take 1 tablet (324 mg total) by mouth 2 (two) times daily with a meal. 60 tablet 5  . latanoprost (XALATAN) 0.005 % ophthalmic solution Apply 1 drop to eye at bedtime.    . metFORMIN (GLUCOPHAGE) 1000 MG tablet Take 1,000 mg by mouth daily with breakfast.     . Multiple Vitamins-Minerals (MULTIVITAMIN ADULT PO) Take 1 tablet by mouth daily.    . Omega-3 Fatty Acids (FISH OIL) 1200 MG CAPS Take 2 capsules by mouth daily.    . pravastatin (PRAVACHOL) 40 MG  tablet Take 40 mg by mouth daily.    . timolol (TIMOPTIC) 0.5 % ophthalmic solution Place 1 drop into both eyes 2 (two) times daily. 10 mL 12  . guaiFENesin (MUCINEX) 600 MG 12 hr tablet Take 1 tablet (600 mg total) by mouth 2 (two) times daily. (Patient not taking: Reported on 01/22/2017) 60 tablet 1  . oxyCODONE 10 MG TABS Take 1 tablet (10 mg total) by mouth every 3 (three) hours as needed for moderate pain. (Patient not taking: Reported on 01/22/2017) 30 tablet 0   No current facility-administered medications for this visit.         Past Medical History:  Diagnosis Date  . Atrial fibrillation ([place])   . BPH (benign prostatic hyperplasia)   . CHF (congestive heart failure) ([place])   . Diabetes mellitus without complication ([place])   . Diverticulosis   . Hyperlipidemia   . Hypertension   . Sleep apnea          Past Surgical History:  Procedure Laterality Date  . ATRIAL ABLATION SURGERY    . CHOLECYSTECTOMY    . LAPAROTOMY  01/01/2017   Procedure: EXPLORATORY LAPAROTOMY; Surgeon: Olean Ree, MD; Location: [facility] ORS; Service: General;;  . PARTIAL COLECTOMY N/A 01/01/2017   Procedure: PARTIAL COLECTOMY; Surgeon: Olean Ree,  MD; Location: [facility] ORS; Service: General; Laterality: N/A;  . PERIPHERAL VASCULAR CATHETERIZATION N/A 12/28/2016   Procedure: Visceral Artery Intervention; Surgeon: Algernon Huxley, MD; Location: [place] [facility]; Service: Cardiovascular; Laterality: N/A;  . SECONDARY CLOSURE OF WOUND N/A 01/08/2017   Procedure: SECONDARY CLOSURE FOR EVISCERATION; Surgeon: Florene Glen, MD; Location: [facility] ORS; Service: General; Laterality: N/A;  . TOTAL KNEE ARTHROPLASTY Right     Social History      Social History  Substance Use Topics  . Smoking status: Former Smoker    Packs/day: 0.25    Years: 29.00    Types: Cigarettes    Quit date: 12/31/1993  . Smokeless tobacco: Never Used  . Alcohol use No    Family  History      Family History  Problem Relation Age of Onset  . Diabetes Sister   . Diabetes Brother   . Diabetes Sister   . Diabetes Brother          Allergies  Allergen Reactions  . Azithromycin Rash  . Penicillins Rash    Has patient had a PCN reaction causing immediate rash, facial/tongue/throat swelling, SOB or lightheadedness with hypotension: Yes Has patient had a PCN reaction causing severe rash involving mucus membranes or skin necrosis: Yes Has patient had a PCN reaction that required hospitalization No Has patient had a PCN reaction occurring within the last 10 years: No If all of the above answers are "NO", then may proceed with Cephalosporin use.   [name] Lisinopril Cough  . Losartan Cough  . Metoprolol Other (See Comments)    Bradycardia.     REVIEW OF SYSTEMS(Negative unless checked)  Constitutional: [] Weight loss[] Fever[] Chills Cardiac:[] Chest pain[] Chest pressure[] Palpitations [] Shortness of breath when laying flat [] Shortness of breath at rest [] Shortness of breath with exertion. Vascular: [] Pain in legs with walking[] Pain in legsat rest[] Pain in legs when laying flat [] Claudication [] Pain in feet when walking [] Pain in feet at rest [] Pain in feet when laying flat [x] History of DVT [] Phlebitis [] Swelling in legs [] Varicose veins [] Non-healing ulcers Pulmonary: [] Uses home oxygen [] Productive cough[] Hemoptysis [] Wheeze [] COPD [] Asthma Neurologic: [] Dizziness [] Blackouts [] Seizures [] History of stroke [] History of TIA[] Aphasia [] Temporary blindness[] Dysphagia [] Weaknessor numbness in arms [] Weakness or numbnessin legs Musculoskeletal: [x] Arthritis [] Joint swelling [] Joint pain [] Low back pain Hematologic:[] Easy bruising[] Easy bleeding [] Hypercoagulable state [] Anemic  Gastrointestinal:[x] Blood in stool[] Vomiting blood[] Gastroesophageal  reflux/heartburn[x] Abdominal pain Genitourinary: [] Chronic kidney disease [] Difficulturination [] Frequenturination [] Burning with urination[] Hematuria Skin: [] Rashes [] Ulcers [] Wounds Psychological: [] History of anxiety[] History of major depression.   Physical Examination  BP (!) 152/80   Pulse 74   Resp 17   Wt 133.7 kg (294 lb 12.8 oz)   BMI 36.85 kg/m  Gen:  WD/WN, NAD. Appears younger than stated age Head: [place]/AT, No temporalis wasting. Ear/Nose/Throat: Hearing grossly intact, nares w/o erythema or drainage, trachea midline Eyes: Conjunctiva clear. Sclera non-icteric Neck: Supple.  No JVD.  Pulmonary:  Good air movement, no use of accessory muscles.  Cardiac: RRR, normal S1, S2 Vascular:  Vessel Right Left  Radial Palpable Palpable  Ulnar Palpable Palpable  Brachial Palpable Palpable  Carotid Palpable, without bruit Palpable, without bruit  Aorta Not palpable N/A  Femoral Palpable Palpable  Popliteal Palpable Palpable  PT 1+ Palpable Palpable  DP Palpable Palpable    Musculoskeletal: M/S 5/5 throughout.  No deformity or atrophy. No appreciable lower extremity edema. Neurologic: Sensation grossly intact in extremities.  Symmetrical.  Speech is fluent.  Psychiatric: Judgment intact, Mood & affect appropriate for pt's clinical situation. Dermatologic: No rashes or ulcers noted.  No cellulitis  or open wounds.       Labs No results found for this or any previous visit (from the past 2160 hour(s)).  Radiology No results found.    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 Also, involved in wound healing. On appropriate medications.   Hypertension blood pressure control important in reducing the progression of atherosclerotic disease. On appropriate oral medications.  DVT (deep venous thrombosis)  ([place]) His duplex shows resolution without recurrent DVT at this time. His postphlebitic symptoms are really improved and not bothersome at this time. Wear stockings as needed for pain or swelling. Return to clinic on an as-needed basis.    Leotis Pain, MD  09/17/2017 10:09 AM    This note was created with Dragon medical transcription system.  Any errors from dictation are purely unintentional

## 2017-09-25 ENCOUNTER — Ambulatory Visit: Payer: Medicare Other | Admitting: Anesthesiology

## 2017-09-25 ENCOUNTER — Ambulatory Visit
Admission: RE | Admit: 2017-09-25 | Discharge: 2017-09-25 | Disposition: A | Discharge status: Home / Self Care | Payer: Medicare Other | Source: Ambulatory Visit | Attending: Unknown Physician Specialty | Admitting: Unknown Physician Specialty

## 2017-09-25 ENCOUNTER — Encounter
Admission: RE | Disposition: A | Discharge status: Home / Self Care | Payer: Self-pay | Source: Ambulatory Visit | Attending: Unknown Physician Specialty

## 2017-09-25 DIAGNOSIS — Z9049 Acquired absence of other specified parts of digestive tract: Secondary | ICD-10-CM | POA: Insufficient documentation

## 2017-09-25 DIAGNOSIS — E785 Hyperlipidemia, unspecified: Secondary | ICD-10-CM | POA: Insufficient documentation

## 2017-09-25 DIAGNOSIS — I4891 Unspecified atrial fibrillation: Secondary | ICD-10-CM | POA: Diagnosis not present

## 2017-09-25 DIAGNOSIS — Z888 Allergy status to other drugs, medicaments and biological substances status: Secondary | ICD-10-CM | POA: Insufficient documentation

## 2017-09-25 DIAGNOSIS — Z833 Family history of diabetes mellitus: Secondary | ICD-10-CM | POA: Insufficient documentation

## 2017-09-25 DIAGNOSIS — Z880 Allergy status to penicillin: Secondary | ICD-10-CM | POA: Insufficient documentation

## 2017-09-25 DIAGNOSIS — G4730 Sleep apnea, unspecified: Secondary | ICD-10-CM | POA: Diagnosis not present

## 2017-09-25 DIAGNOSIS — Z86010 Personal history of colonic polyps: Secondary | ICD-10-CM | POA: Insufficient documentation

## 2017-09-25 DIAGNOSIS — Z1211 Encounter for screening for malignant neoplasm of colon: Secondary | ICD-10-CM | POA: Diagnosis not present

## 2017-09-25 DIAGNOSIS — I110 Hypertensive heart disease with heart failure: Secondary | ICD-10-CM | POA: Insufficient documentation

## 2017-09-25 DIAGNOSIS — I428 Other cardiomyopathies: Secondary | ICD-10-CM | POA: Insufficient documentation

## 2017-09-25 DIAGNOSIS — Z7984 Long term (current) use of oral hypoglycemic drugs: Secondary | ICD-10-CM | POA: Diagnosis not present

## 2017-09-25 DIAGNOSIS — H47019 Ischemic optic neuropathy, unspecified eye: Secondary | ICD-10-CM | POA: Diagnosis not present

## 2017-09-25 DIAGNOSIS — E118 Type 2 diabetes mellitus with unspecified complications: Secondary | ICD-10-CM | POA: Diagnosis not present

## 2017-09-25 DIAGNOSIS — Z79899 Other long term (current) drug therapy: Secondary | ICD-10-CM | POA: Diagnosis not present

## 2017-09-25 DIAGNOSIS — K633 Ulcer of intestine: Secondary | ICD-10-CM | POA: Insufficient documentation

## 2017-09-25 DIAGNOSIS — N529 Male erectile dysfunction, unspecified: Secondary | ICD-10-CM | POA: Diagnosis not present

## 2017-09-25 DIAGNOSIS — Z7982 Long term (current) use of aspirin: Secondary | ICD-10-CM | POA: Diagnosis not present

## 2017-09-25 DIAGNOSIS — D128 Benign neoplasm of rectum: Secondary | ICD-10-CM | POA: Insufficient documentation

## 2017-09-25 DIAGNOSIS — Z96651 Presence of right artificial knee joint: Secondary | ICD-10-CM | POA: Diagnosis not present

## 2017-09-25 DIAGNOSIS — Z881 Allergy status to other antibiotic agents status: Secondary | ICD-10-CM | POA: Insufficient documentation

## 2017-09-25 DIAGNOSIS — Z86718 Personal history of other venous thrombosis and embolism: Secondary | ICD-10-CM | POA: Insufficient documentation

## 2017-09-25 DIAGNOSIS — K640 First degree hemorrhoids: Secondary | ICD-10-CM | POA: Insufficient documentation

## 2017-09-25 DIAGNOSIS — Z87891 Personal history of nicotine dependence: Secondary | ICD-10-CM | POA: Insufficient documentation

## 2017-09-25 DIAGNOSIS — I509 Heart failure, unspecified: Secondary | ICD-10-CM | POA: Diagnosis not present

## 2017-09-25 HISTORY — DX: Diverticulitis of intestine, part unspecified, without perforation or abscess without bleeding: K57.92

## 2017-09-25 HISTORY — DX: Gastrointestinal hemorrhage, unspecified: K92.2

## 2017-09-25 HISTORY — DX: Unspecified osteoarthritis, unspecified site: M19.90

## 2017-09-25 HISTORY — DX: Unspecified hearing loss, unspecified ear: H91.90

## 2017-09-25 HISTORY — DX: Ischemic optic neuropathy, unspecified eye: H47.019

## 2017-09-25 HISTORY — PX: COLONOSCOPY WITH PROPOFOL: SHX5780

## 2017-09-25 HISTORY — DX: Discitis, unspecified, site unspecified: M46.40

## 2017-09-25 HISTORY — DX: Male erectile dysfunction, unspecified: N52.9

## 2017-09-25 HISTORY — DX: Other cardiomyopathies: I42.8

## 2017-09-25 HISTORY — DX: Acute embolism and thrombosis of unspecified deep veins of unspecified lower extremity: I82.409

## 2017-09-25 LAB — GLUCOSE, CAPILLARY: GLUCOSE-CAPILLARY: 116 mg/dL — AB (ref 65–99)

## 2017-09-25 SURGERY — COLONOSCOPY WITH PROPOFOL
Anesthesia: General

## 2017-09-25 MED ORDER — GENTAMICIN SULFATE 40 MG/ML IJ SOLN
100.0000 mg | Freq: Once | INTRAMUSCULAR | Status: AC
  Administered 2017-09-25: 100 mg via INTRAVENOUS
  Filled 2017-09-25: qty 2.5

## 2017-09-25 MED ORDER — GENTAMICIN IN SALINE 1-0.9 MG/ML-% IV SOLN
100.0000 mg | Freq: Once | INTRAVENOUS | Status: DC
  Filled 2017-09-25: qty 100

## 2017-09-25 MED ORDER — SODIUM CHLORIDE 0.9 % IV SOLN: INTRAVENOUS | Status: DC

## 2017-09-25 MED ORDER — PHENYLEPHRINE HCL 10 MG/ML IJ SOLN
INTRAMUSCULAR | Status: DC | PRN
  Administered 2017-09-25: 200 ug via INTRAVENOUS
  Administered 2017-09-25 (×3): 100 ug via INTRAVENOUS

## 2017-09-25 MED ORDER — MIDAZOLAM HCL 2 MG/2ML IJ SOLN
INTRAMUSCULAR | Status: DC | PRN
  Administered 2017-09-25 (×2): 1 mg via INTRAVENOUS

## 2017-09-25 MED ORDER — MIDAZOLAM HCL 2 MG/2ML IJ SOLN
INTRAMUSCULAR | Status: AC
  Filled 2017-09-25: qty 2

## 2017-09-25 MED ORDER — SODIUM CHLORIDE 0.9 % IV SOLN
INTRAVENOUS | Status: DC
  Administered 2017-09-25: 10:00:00 via INTRAVENOUS

## 2017-09-25 MED ORDER — FENTANYL CITRATE (PF) 100 MCG/2ML IJ SOLN
INTRAMUSCULAR | Status: AC
  Filled 2017-09-25: qty 2

## 2017-09-25 MED ORDER — FENTANYL CITRATE (PF) 100 MCG/2ML IJ SOLN
INTRAMUSCULAR | Status: DC | PRN
  Administered 2017-09-25 (×2): 50 ug via INTRAVENOUS

## 2017-09-25 MED ORDER — VANCOMYCIN HCL IN DEXTROSE 1-5 GM/200ML-% IV SOLN
1000.0000 mg | Freq: Once | INTRAVENOUS | Status: AC
  Administered 2017-09-25: 1000 mg via INTRAVENOUS
  Filled 2017-09-25: qty 200

## 2017-09-25 MED ORDER — PROPOFOL 500 MG/50ML IV EMUL
INTRAVENOUS | Status: DC | PRN
  Administered 2017-09-25: 75 ug/kg/min via INTRAVENOUS

## 2017-09-25 NOTE — Anesthesia Postprocedure Evaluation (Signed)
Anesthesia Post Note  Patient: Evan [name]  Procedure(s) Performed: Procedure(s) (LRB): COLONOSCOPY WITH PROPOFOL (N/A)  Patient location during evaluation: Endoscopy Anesthesia Type: General Level of consciousness: awake and alert Pain management: pain level controlled Vital Signs Assessment: post-procedure vital signs reviewed and stable Respiratory status: spontaneous breathing and respiratory function stable Cardiovascular status: stable Anesthetic complications: no     Last Vitals:  Vitals:   09/25/17 0940 09/25/17 1214  BP: (!) 184/92 125/72  Pulse: 74 76  Resp: 20 20  Temp: (!) 36.1 C (!) 36.1 C  SpO2: 100% 97%    Last Pain:  Vitals:   09/25/17 1214  TempSrc: Tympanic                 Evan [name],Evan [name]

## 2017-09-25 NOTE — Anesthesia Preprocedure Evaluation (Signed)
Anesthesia Evaluation  Patient identified by MRN, date of birth, ID band Patient awake    Reviewed: Allergy & Precautions, NPO status , Patient's Chart, lab work & pertinent test results  History of Anesthesia Complications Negative for: history of anesthetic complications  Airway Mallampati: II       Dental   Pulmonary sleep apnea and Continuous Positive Airway Pressure Ventilation , former smoker,           Cardiovascular hypertension, Pt. on medications +CHF  + dysrhythmias (s/p ablation, SR since) Atrial Fibrillation      Neuro/Psych neg Seizures    GI/Hepatic Neg liver ROS, neg GERD  ,  Endo/Other  diabetes, Type 2, Oral Hypoglycemic Agents  Renal/GU Renal InsufficiencyRenal disease     Musculoskeletal   Abdominal   Peds  Hematology   Anesthesia Other Findings   Reproductive/Obstetrics                             Anesthesia Physical Anesthesia Plan  ASA: III  Anesthesia Plan: General   Post-op Pain Management:    Induction: Intravenous  PONV Risk Score and Plan:   Airway Management Planned:   Additional Equipment:   Intra-op Plan:   Post-operative Plan:   Informed Consent: I have reviewed the patients History and Physical, chart, labs and discussed the procedure including the risks, benefits and alternatives for the proposed anesthesia with the patient or authorized representative who has indicated his/her understanding and acceptance.     Plan Discussed with:   Anesthesia Plan Comments:         Anesthesia Quick Evaluation

## 2017-09-25 NOTE — Transfer of Care (Signed)
Immediate Anesthesia Transfer of Care Note  Patient: Evan [name]  Procedure(s) Performed: Procedure(s): COLONOSCOPY WITH PROPOFOL (N/A)  Patient Location: PACU  Anesthesia Type:General  Level of Consciousness: awake, alert  and oriented  Airway & Oxygen Therapy: Patient Spontanous Breathing and Patient connected to nasal cannula oxygen  Post-op Assessment: Report given to RN and Post -op Vital signs reviewed and stable  Post vital signs: Reviewed and stable  Last Vitals:  Vitals:   09/25/17 0940  BP: (!) 184/92  Pulse: 74  Resp: 20  Temp: (!) 36.1 C  SpO2: 100%    Last Pain:  Vitals:   09/25/17 0940  TempSrc: Tympanic         Complications: No apparent anesthesia complications

## 2017-09-25 NOTE — Op Note (Signed)
[facility] Patient Name: Evan [name] Procedure Date: 09/25/2017 11:19 AM MRN: [id number] Account #: [id number] Date of Birth: 07-Oct-1945 Admit Type: Outpatient Age: 72 Room: [facility] ENDO ROOM 3 Gender: Male Note Status: Finalized Procedure:            Colonoscopy Indications:          High risk colon cancer surveillance: Personal history                        of colonic polyps Providers:            Manya Silvas, MD Medicines:            Propofol per Anesthesia Complications:        No immediate complications. Procedure:            Pre-Anesthesia Assessment:                       - After reviewing the risks and benefits, the patient                        was deemed in satisfactory condition to undergo the                        procedure.                       After obtaining informed consent, the colonoscope was                        passed under direct vision. Throughout the procedure,                        the patient's blood pressure, pulse, and oxygen                        saturations were monitored continuously. The                        Colonoscope was introduced through the anus and                        advanced to the the cecum, identified by appendiceal                        orifice and ileocecal valve. The colonoscopy was                        performed without difficulty. The patient tolerated the                        procedure well. The quality of the bowel preparation                        was good. Findings:      A small polyp was found in the rectum. The polyp was sessile. The polyp       was removed with a hot snare. Resection and retrieval were complete.       Mucosa near the polyp was biopsied.      A single (solitary) three mm ulcer was found in the descending colon. No  bleeding was present. No stigmata of recent bleeding were seen.      Internal hemorrhoids were found during endoscopy. The hemorrhoids  were       small and Grade I (internal hemorrhoids that do not prolapse). Impression:           - One small polyp in the rectum, removed with a hot                        snare. Resected and retrieved.                       - A single (solitary) ulcer in the descending colon.                       - Internal hemorrhoids. Recommendation:       - Await pathology results. Manya Silvas, MD 09/25/2017 12:12:42 PM This report has been signed electronically. Number of Addenda: 0 Note Initiated On: 09/25/2017 11:19 AM Scope Withdrawal Time: 0 hours 22 minutes 20 seconds  Total Procedure Duration: 0 hours 30 minutes 0 seconds       [facility]

## 2017-09-25 NOTE — Anesthesia Post-op Follow-up Note (Signed)
Anesthesia QCDR form completed.        

## 2017-09-25 NOTE — H&P (Signed)
Primary Care Physician:  Ezequiel Kayser, MD Primary Gastroenterologist:  Dr. Vira Agar  Pre-Procedure History & Physical: HPI:  Evan [name] is a 72 y.o. male is here for an colonoscopy.   Past Medical History:  Diagnosis Date  . Adenoma of colon   . Arthritis    xDJD  . Atrial fibrillation ([place])   . BPH (benign prostatic hyperplasia)   . CHF (congestive heart failure) ([place])   . Diabetes mellitus without complication ([place])   . Discitis   . Diverticulitis   . Diverticulosis   . DVT (deep venous thrombosis) ([place])   . Erectile dysfunction   . Hearing loss   . Hyperlipidemia   . Hypertension   . Ischemic optic neuropathy   . Lower GI bleeding   . Non-ischemic cardiomyopathy ([place])   . Sleep apnea     Past Surgical History:  Procedure Laterality Date  . ATRIAL ABLATION SURGERY    . CHOLECYSTECTOMY    . JOINT REPLACEMENT Right    knee  . LAPAROTOMY  01/01/2017   Procedure: EXPLORATORY LAPAROTOMY;  Surgeon: Olean Ree, MD;  Location: [facility] ORS;  Service: General;;  . PARTIAL COLECTOMY N/A 01/01/2017   Procedure: PARTIAL COLECTOMY;  Surgeon: Olean Ree, MD;  Location: [facility] OR[facility];  Service: General;  Laterality: N/A;  . PERIPHERAL VASCULAR CATHETERIZATION N/A 12/28/2016   Procedure: Visceral Artery Intervention;  Surgeon: Algernon Huxley, MD;  Location: [place] [facility];  Service: Cardiovascular;  Laterality: N/A;  . SECONDARY CLOSURE OF WOUND N/A 01/08/2017   Procedure: SECONDARY CLOSURE FOR EVISCERATION;  Surgeon: Florene Glen, MD;  Location: [facility];  Service: General;  Laterality: N/A;  . SIGMOIDECTOMY    . TOTAL KNEE ARTHROPLASTY Right     Prior to Admission medications   Medication Sig Start Date End Date Taking? Authorizing Provider  amLODipine (NORVASC) 5 MG tablet Take 5 mg by mouth daily. 05/03/16  Yes [provider]  docusate calcium (SURFAK) 240 MG capsule Take 1 capsule by mouth daily.   Yes [provider]  Multiple Vitamins-Minerals (CENTRUM  SILVER PO) Take by mouth every morning.   Yes [provider]  acetaminophen (TYLENOL) 500 MG tablet Take 1,000 mg by mouth every 6 (six) hours as needed.    [provider]  aspirin EC 81 MG EC tablet Take 1 tablet (81 mg total) by mouth daily. 01/16/17   Hubbard Robinson, MD  Cholecalciferol (VITAMIN D3) 2000 units capsule Take 2,000 Units by mouth daily.    [provider]  dorzolamide-timolol (COSOPT) 22.3-6.8 MG/ML ophthalmic solution Apply 1 drop to eye 2 (two) times daily.    [provider]  ferrous gluconate (FERGON) 324 MG tablet Take 1 tablet (324 mg total) by mouth 2 (two) times daily with a meal. 01/06/17   Theodoro Grist, MD  ferrous gluconate (FERGON) 324 MG tablet Take by mouth. 01/06/17   [provider]  latanoprost (XALATAN) 0.005 % ophthalmic solution Apply 1 drop to eye at bedtime.    [provider]  metFORMIN (GLUCOPHAGE-XR) 500 MG 24 hr tablet 1,000 mg daily with breakfast.  08/30/17   [provider]  Omega-3 Fatty Acids (FISH OIL) 1200 MG CAPS Take 2 capsules by mouth daily.    [provider]  pravastatin (PRAVACHOL) 40 MG tablet Take 40 mg by mouth daily. 05/03/16   [provider]  [facility] injection  08/15/17   [provider]    Allergies as of 09/24/2017 - Review Complete 09/24/2017  Allergen Reaction Noted  . Azithromycin Rash 07/09/2016  . Penicillins Rash 07/09/2016  . Lisinopril Cough 07/09/2016  . Losartan Cough 07/09/2016  . Metoprolol Other (See Comments) 07/09/2016    Family History  Problem Relation Age of Onset  . Diabetes Sister   . Diabetes Brother   . Diabetes Sister   . Diabetes Brother     Social History   Social History  . Marital status: Married    Spouse name: N/A  . Number of children: N/A  . Years of education: N/A   Occupational History  . Not on file.   Social History Main Topics  . Smoking status: Former Smoker    Packs/day: 0.25     Years: 29.00    Types: Cigarettes    Quit date: 12/31/1993  . Smokeless tobacco: Never Used  . Alcohol use No  . Drug use: No  . Sexual activity: Not on file   Other Topics Concern  . Not on file   Social History Narrative  . No narrative on file    Review of Systems: See HPI, otherwise negative ROS  Physical Exam: BP (!) 184/92   Pulse 74   Temp (!) 97 F (36.1 C) (Tympanic)   Resp 20   Ht 6\' 4"  (1.93 m)   Wt 131.5 kg (290 lb)   SpO2 100%   BMI 35.30 kg/m  General:   Alert,  pleasant and cooperative in NAD Head:  Normocephalic and atraumatic. Neck:  Supple; no masses or thyromegaly. Lungs:  Clear throughout to auscultation.    Heart:  Regular rate and rhythm. Abdomen:  Soft, nontender and nondistended. Normal bowel sounds, without guarding, and without rebound.   Neurologic:  Alert and  oriented x4;  grossly normal neurologically.  Impression/Plan: Bascom Biel is here for an colonoscopy to be performed for [facility] colon polyps  Risks, benefits, limitations, and alternatives regarding  colonoscopy have been reviewed with the patient.  Questions have been answered.  All parties agreeable.   Gaylyn Cheers, MD  09/25/2017, 10:28 AM

## 2017-09-26 ENCOUNTER — Encounter: Payer: Self-pay | Admitting: Unknown Physician Specialty

## 2017-09-26 LAB — SURGICAL PATHOLOGY

## 2017-12-02 IMAGING — RF DG BE W/ AIR HIGH DENSITY
2 series · 3 of 3 positions shown · non-contrast
Comparison: None.

ADDENDUM:
Findings and recommendations also conveyed toDr. [REDACTED] 12/29/2016
at[DATE].
CLINICAL DATA: Sigmoid colon gastrointestinal bleeding.

EXAM:
SINGLE COLUMN BARIUM ENEMA
TECHNIQUE: Initial scout AP supine abdominal image obtained to insure adequate
colon cleansing. Barium was introduced into the colon in a
retrograde fashion and refluxed from the rectum to the cecum. Spot
images of the colon followed by overhead radiographs were obtained.
FLUOROSCOPY TIME:  Fluoroscopy Time:
Radiation Exposure Index (if provided by the fluoroscopic device):
136 mGy
Number of Acquired Spot Images: 2

[Series 1: t abdomen supine · 0.14mm/px · 1 of 1 slices shown]
[im 1/1]
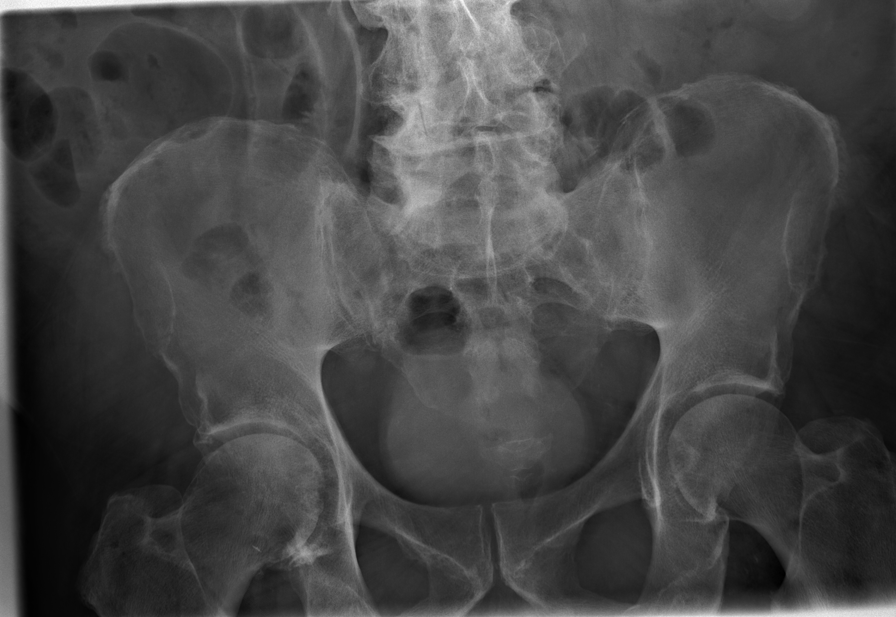

[Series 3: fluoro_barium singleshot_bb · 0.18mm/px · 2 of 2 slices shown]
[im 1/2]
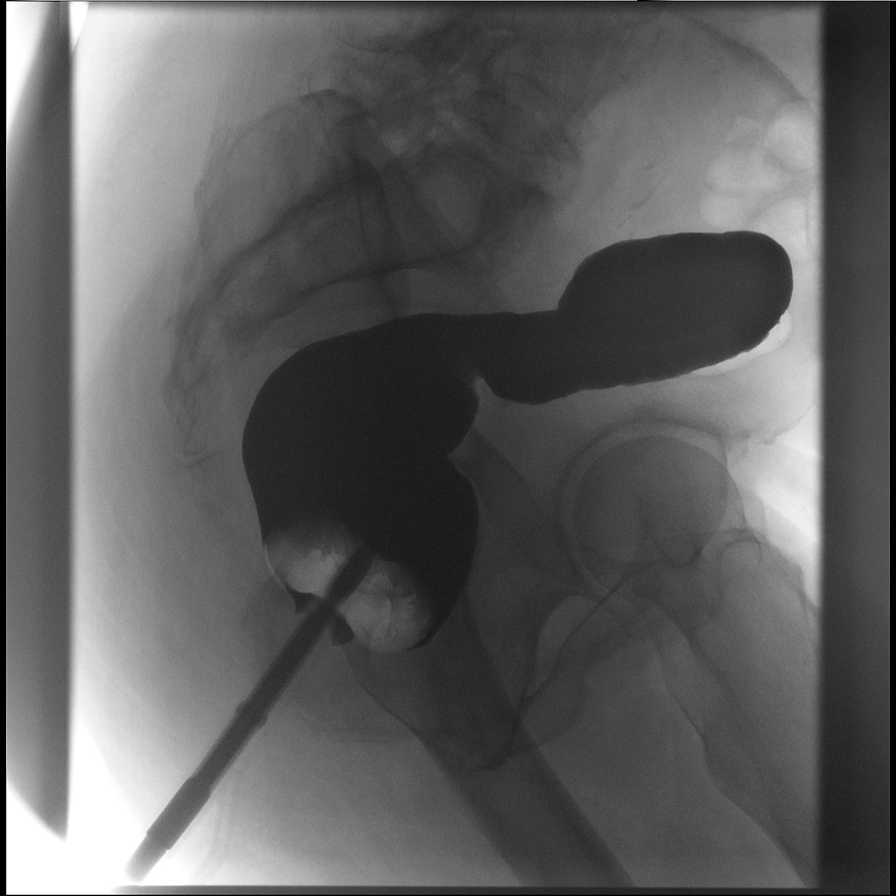
[im 2/2]
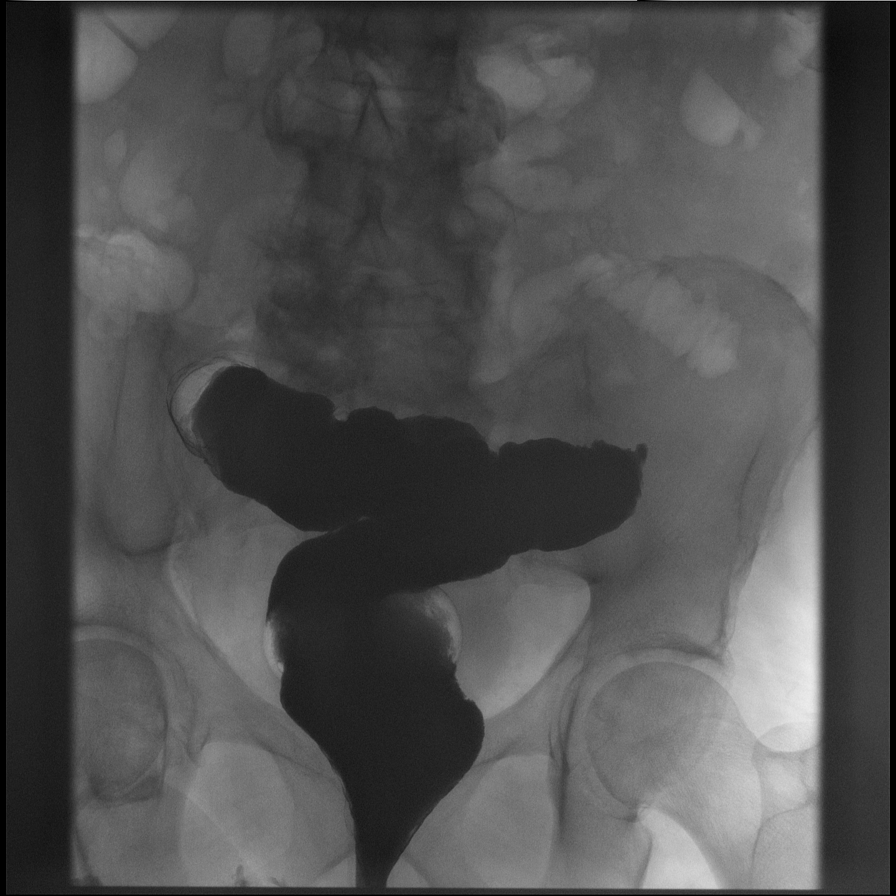

[3 of 3 positions shown; findings below may reference images not displayed]

FINDINGS: Intermittent inserted into the rectum and retention balloon
inflated. High-density barium was introduced into the rectum via
gravity feed.

Contrast flowed into the sigmoid colon. Contrast could not be
advanced past the sigmoid colon. 8644 cc was introduced with
ultimately leakage around the enema tip and no advancement into the
descending colon.

No diverticular disease noted in the sigmoid colon.
IMPRESSION: 1. High-density therapy barium enema for gastrointestinal bleeding
per general surgery request.
2. No evidence of diverticular disease in the sigmoid colon.
3. Contrast could not be advanced past the proximal sigmoid colon.
Cannot exclude obstructing mass. Recommend CT of the abdomen and
pelvis for further evaluation (recognizing some artifact from the
high-density barium).
Initial Findings conveyed Juninhu Paiano on 12/29/2016  at[DATE].

## 2018-02-16 ENCOUNTER — Encounter: Payer: Self-pay | Admitting: Emergency Medicine

## 2018-02-16 ENCOUNTER — Other Ambulatory Visit: Payer: Self-pay

## 2018-02-16 ENCOUNTER — Inpatient Hospital Stay
Admission: EM | Admit: 2018-02-16 | Discharge: 2018-02-19 | DRG: 379 | Disposition: A | Discharge status: Home / Self Care | Payer: Medicare HMO | Attending: Internal Medicine | Admitting: Internal Medicine

## 2018-02-16 DIAGNOSIS — Z6835 Body mass index (BMI) 35.0-35.9, adult: Secondary | ICD-10-CM

## 2018-02-16 DIAGNOSIS — G4733 Obstructive sleep apnea (adult) (pediatric): Secondary | ICD-10-CM | POA: Diagnosis present

## 2018-02-16 DIAGNOSIS — M1710 Unilateral primary osteoarthritis, unspecified knee: Secondary | ICD-10-CM | POA: Diagnosis present

## 2018-02-16 DIAGNOSIS — E1140 Type 2 diabetes mellitus with diabetic neuropathy, unspecified: Secondary | ICD-10-CM | POA: Diagnosis present

## 2018-02-16 DIAGNOSIS — E785 Hyperlipidemia, unspecified: Secondary | ICD-10-CM | POA: Diagnosis present

## 2018-02-16 DIAGNOSIS — W19XXXA Unspecified fall, initial encounter: Secondary | ICD-10-CM | POA: Diagnosis not present

## 2018-02-16 DIAGNOSIS — Z880 Allergy status to penicillin: Secondary | ICD-10-CM | POA: Diagnosis not present

## 2018-02-16 DIAGNOSIS — Z96651 Presence of right artificial knee joint: Secondary | ICD-10-CM | POA: Diagnosis present

## 2018-02-16 DIAGNOSIS — N400 Enlarged prostate without lower urinary tract symptoms: Secondary | ICD-10-CM | POA: Diagnosis present

## 2018-02-16 DIAGNOSIS — M25562 Pain in left knee: Secondary | ICD-10-CM | POA: Diagnosis not present

## 2018-02-16 DIAGNOSIS — Z8719 Personal history of other diseases of the digestive system: Secondary | ICD-10-CM | POA: Diagnosis not present

## 2018-02-16 DIAGNOSIS — Z7984 Long term (current) use of oral hypoglycemic drugs: Secondary | ICD-10-CM

## 2018-02-16 DIAGNOSIS — K921 Melena: Principal | ICD-10-CM | POA: Diagnosis present

## 2018-02-16 DIAGNOSIS — N529 Male erectile dysfunction, unspecified: Secondary | ICD-10-CM | POA: Diagnosis present

## 2018-02-16 DIAGNOSIS — Y92239 Unspecified place in hospital as the place of occurrence of the external cause: Secondary | ICD-10-CM | POA: Diagnosis not present

## 2018-02-16 DIAGNOSIS — R55 Syncope and collapse: Secondary | ICD-10-CM | POA: Diagnosis not present

## 2018-02-16 DIAGNOSIS — H9190 Unspecified hearing loss, unspecified ear: Secondary | ICD-10-CM | POA: Diagnosis present

## 2018-02-16 DIAGNOSIS — J449 Chronic obstructive pulmonary disease, unspecified: Secondary | ICD-10-CM | POA: Diagnosis present

## 2018-02-16 DIAGNOSIS — Z86718 Personal history of other venous thrombosis and embolism: Secondary | ICD-10-CM | POA: Diagnosis not present

## 2018-02-16 DIAGNOSIS — Z7982 Long term (current) use of aspirin: Secondary | ICD-10-CM

## 2018-02-16 DIAGNOSIS — K5730 Diverticulosis of large intestine without perforation or abscess without bleeding: Secondary | ICD-10-CM | POA: Diagnosis present

## 2018-02-16 DIAGNOSIS — Z9049 Acquired absence of other specified parts of digestive tract: Secondary | ICD-10-CM

## 2018-02-16 DIAGNOSIS — I4891 Unspecified atrial fibrillation: Secondary | ICD-10-CM | POA: Diagnosis present

## 2018-02-16 DIAGNOSIS — Z881 Allergy status to other antibiotic agents status: Secondary | ICD-10-CM | POA: Diagnosis not present

## 2018-02-16 DIAGNOSIS — Z888 Allergy status to other drugs, medicaments and biological substances status: Secondary | ICD-10-CM | POA: Diagnosis not present

## 2018-02-16 DIAGNOSIS — Z833 Family history of diabetes mellitus: Secondary | ICD-10-CM | POA: Diagnosis not present

## 2018-02-16 DIAGNOSIS — E7800 Pure hypercholesterolemia, unspecified: Secondary | ICD-10-CM | POA: Diagnosis present

## 2018-02-16 DIAGNOSIS — Z86010 Personal history of colonic polyps: Secondary | ICD-10-CM

## 2018-02-16 DIAGNOSIS — I110 Hypertensive heart disease with heart failure: Secondary | ICD-10-CM | POA: Diagnosis present

## 2018-02-16 DIAGNOSIS — Z87891 Personal history of nicotine dependence: Secondary | ICD-10-CM

## 2018-02-16 DIAGNOSIS — Z79899 Other long term (current) drug therapy: Secondary | ICD-10-CM

## 2018-02-16 DIAGNOSIS — I509 Heart failure, unspecified: Secondary | ICD-10-CM | POA: Diagnosis present

## 2018-02-16 DIAGNOSIS — M25569 Pain in unspecified knee: Secondary | ICD-10-CM

## 2018-02-16 LAB — COMPREHENSIVE METABOLIC PANEL
ALBUMIN: 4.2 g/dL (ref 3.5–5.0)
ALT: 18 U/L (ref 17–63)
ANION GAP: 9 (ref 5–15)
AST: 17 U/L (ref 15–41)
Alkaline Phosphatase: 78 U/L (ref 38–126)
BILIRUBIN TOTAL: 0.8 mg/dL (ref 0.3–1.2)
BUN: 14 mg/dL (ref 6–20)
CO2: 27 mmol/L (ref 22–32)
Calcium: 10.1 mg/dL (ref 8.9–10.3)
Chloride: 102 mmol/L (ref 101–111)
Creatinine, Ser: 0.91 mg/dL (ref 0.61–1.24)
GFR calc Af Amer: >60 mL/min (ref >60)
GLUCOSE: 152 mg/dL — AB (ref 65–99)
POTASSIUM: 4.2 mmol/L (ref 3.5–5.1)
Sodium: 138 mmol/L (ref 135–145)
TOTAL PROTEIN: 7.5 g/dL (ref 6.5–8.1)

## 2018-02-16 LAB — CBC
HEMATOCRIT: 47 % (ref 40.0–52.0)
HEMOGLOBIN: 15.5 g/dL (ref 13.0–18.0)
MCH: 28.3 pg (ref 26.0–34.0)
MCHC: 33 g/dL (ref 32.0–36.0)
MCV: 85.7 fL (ref 80.0–100.0)
Platelets: 275 10*3/uL (ref 150–440)
RBC: 5.48 MIL/uL (ref 4.40–5.90)
RDW: 14.4 % (ref 11.5–14.5)
WBC: 9.6 10*3/uL (ref 3.8–10.6)

## 2018-02-16 LAB — GLUCOSE, CAPILLARY: Glucose-Capillary: 248 mg/dL — ABNORMAL HIGH (ref 65–99)

## 2018-02-16 LAB — HEMOGLOBIN AND HEMATOCRIT, BLOOD
HEMATOCRIT: 41.4 % (ref 40.0–52.0)
HEMOGLOBIN: 13.5 g/dL (ref 13.0–18.0)

## 2018-02-16 LAB — TROPONIN I: Troponin I: <0.03 ng/mL (ref <0.03)

## 2018-02-16 MED ORDER — INSULIN ASPART 100 UNIT/ML ~~LOC~~ SOLN
0.0000 [IU] | Freq: Every day | SUBCUTANEOUS | Status: DC
  Administered 2018-02-17: 3 [IU] via SUBCUTANEOUS
  Filled 2018-02-16: qty 1

## 2018-02-16 MED ORDER — SODIUM CHLORIDE 0.9 % IV SOLN: Freq: Once | INTRAVENOUS | Status: DC

## 2018-02-16 MED ORDER — LACTATED RINGERS IV SOLN
INTRAVENOUS | Status: DC
  Administered 2018-02-17 – 2018-02-19 (×3): via INTRAVENOUS

## 2018-02-16 MED ORDER — ACETAMINOPHEN 650 MG RE SUPP: 650.0000 mg | Freq: Four times a day (QID) | RECTAL | Status: DC | PRN

## 2018-02-16 MED ORDER — ONDANSETRON HCL 4 MG PO TABS: 4.0000 mg | ORAL_TABLET | Freq: Four times a day (QID) | ORAL | Status: DC | PRN

## 2018-02-16 MED ORDER — HYDROCODONE-ACETAMINOPHEN 5-325 MG PO TABS: 1.0000 | ORAL_TABLET | ORAL | Status: DC | PRN

## 2018-02-16 MED ORDER — ONDANSETRON HCL 4 MG/2ML IJ SOLN: 4.0000 mg | Freq: Four times a day (QID) | INTRAMUSCULAR | Status: DC | PRN

## 2018-02-16 MED ORDER — ACETAMINOPHEN 325 MG PO TABS: 650.0000 mg | ORAL_TABLET | Freq: Four times a day (QID) | ORAL | Status: DC | PRN

## 2018-02-16 MED ORDER — INSULIN ASPART 100 UNIT/ML ~~LOC~~ SOLN
0.0000 [IU] | Freq: Three times a day (TID) | SUBCUTANEOUS | Status: DC
  Administered 2018-02-17 (×2): 2 [IU] via SUBCUTANEOUS
  Administered 2018-02-17 – 2018-02-18 (×2): 3 [IU] via SUBCUTANEOUS
  Administered 2018-02-19 (×2): 2 [IU] via SUBCUTANEOUS
  Filled 2018-02-16 (×6): qty 1

## 2018-02-16 MED ORDER — IPRATROPIUM-ALBUTEROL 0.5-2.5 (3) MG/3ML IN SOLN
3.0000 mL | RESPIRATORY_TRACT | Status: DC | PRN
Start: 2018-02-16 — End: 2018-02-19

## 2018-02-16 NOTE — ED Notes (Addendum)
Pt states hx of diverticulosis/itis. States tonight noticed red blood in stool. Went twice at home, twice in triage. States took out part of colon previously. Denies blood thinner use. Takes iron tablets. Denies hemorroids. Colonoscopy few months ago. Denies weakness, lightheadedness. Denies pain, "just soreness."   Alert, oriented, wife at bedside.

## 2018-02-16 NOTE — Progress Notes (Signed)
Responded to rapid response. No RT interventions at this time. Patient just seen prior to rapid in reference to cpap.  Patient refuse cpap but will have RN to call if he changes his mind

## 2018-02-16 NOTE — H&P (Signed)
[facility] [place] NAME: Evan [name]    MR#:  [id number]  DATE OF BIRTH:  12-31-1945  DATE OF ADMISSION:  02/16/2018  PRIMARY CARE PHYSICIAN: Ezequiel Kayser, MD   REQUESTING/REFERRING PHYSICIAN:   CHIEF COMPLAINT:   Chief Complaint  Patient presents with  . Rectal Bleeding    HISTORY OF PRESENT ILLNESS: Evan [name]  is a 73 y.o. male with a known history of diverticulosis status post partial colectomy in January 2017, returns with first ever episode of recurrent bright red blood per rectum, 4 episodes, bleeding a moderate amount of dark to bright red per wife, emergency room workup was unimpressive, patient evaluated in the emergency room with wife present, currently in no apparent distress, no pain, patient now being admitted for acute recurrent diverticular bleeding  PAST MEDICAL HISTORY:   Past Medical History:  Diagnosis Date  . Adenoma of colon   . Arthritis    xDJD  . Atrial fibrillation ([place])   . BPH (benign prostatic hyperplasia)   . CHF (congestive heart failure) ([place])   . Diabetes mellitus without complication ([place])   . Discitis   . Diverticulitis   . Diverticulosis   . DVT (deep venous thrombosis) ([place])   . Erectile dysfunction   . Hearing loss   . Hyperlipidemia   . Hypertension   . Ischemic optic neuropathy   . Lower GI bleeding   . Non-ischemic cardiomyopathy ([place])   . Sleep apnea     PAST SURGICAL HISTORY:  Past Surgical History:  Procedure Laterality Date  . ATRIAL ABLATION SURGERY    . CHOLECYSTECTOMY    . COLONOSCOPY WITH PROPOFOL N/A 09/25/2017   Procedure: COLONOSCOPY WITH PROPOFOL;  Surgeon: Manya Silvas, MD;  Location: [facility];  Service: Endoscopy;  Laterality: N/A;  . JOINT REPLACEMENT Right    knee  . LAPAROTOMY  01/01/2017   Procedure: EXPLORATORY LAPAROTOMY;  Surgeon: Olean Ree, MD;  Location: [facility] OR[facility];  Service: General;;  . PARTIAL COLECTOMY N/A 01/01/2017   Procedure: PARTIAL  COLECTOMY;  Surgeon: Olean Ree, MD;  Location: [facility] OR[facility];  Service: General;  Laterality: N/A;  . PERIPHERAL VASCULAR CATHETERIZATION N/A 12/28/2016   Procedure: Visceral Artery Intervention;  Surgeon: Algernon Huxley, MD;  Location: [place] [facility];  Service: Cardiovascular;  Laterality: N/A;  . SECONDARY CLOSURE OF WOUND N/A 01/08/2017   Procedure: SECONDARY CLOSURE FOR EVISCERATION;  Surgeon: Florene Glen, MD;  Location: [facility] OR[facility];  Service: General;  Laterality: N/A;  . SIGMOIDECTOMY    . TOTAL KNEE ARTHROPLASTY Right     SOCIAL HISTORY:  Social History   Tobacco Use  . Smoking status: Former Smoker    Packs/day: 0.25    Years: 29.00    Pack years: 7.25    Types: Cigarettes    Last attempt to quit: 12/31/1993    Years since quitting: 24.1  . Smokeless tobacco: Never Used  Substance Use Topics  . Alcohol use: No    Alcohol/week: 0.0 oz    FAMILY HISTORY:  Family History  Problem Relation Age of Onset  . Diabetes Sister   . Diabetes Brother   . Diabetes Sister   . Diabetes Brother     DRUG ALLERGIES:  Allergies  Allergen Reactions  . Azithromycin Rash  . Penicillins Rash    Has patient had a PCN reaction causing immediate rash, facial/tongue/throat swelling, SOB or lightheadedness with hypotension: Yes Has patient had a PCN reaction causing severe rash  involving mucus membranes or skin necrosis: Yes Has patient had a PCN reaction that required hospitalization No Has patient had a PCN reaction occurring within the last 10 years: No If all of the above answers are "NO", then may proceed with Cephalosporin use. Has patient had a PCN reaction causing immediate rash, facial/tongue/throat swelling, SOB or lightheadedness with hypotension: Yes Has patient had a PCN reaction causing severe rash involving mucus membranes or skin necrosis: Yes Has patient had a PCN reaction that required hospitalization No Has patient had a PCN reaction occurring within the last 10 years:  No If all of the above answers are "NO", then may proceed with Cephalosporin use.   [name] Lisinopril Cough  . Losartan Cough  . Metoprolol Other (See Comments)    Bradycardia.    REVIEW OF SYSTEMS:   CONSTITUTIONAL: No fever, fatigue or weakness.  EYES: No blurred or double vision.  EARS, NOSE, AND THROAT: No tinnitus or ear pain.  RESPIRATORY: No cough, shortness of breath, wheezing or hemoptysis.  CARDIOVASCULAR: No chest pain, orthopnea, edema.  GASTROINTESTINAL: No nausea, vomiting, diarrhea or abdominal pain.  Positive GI bleeding  gENITOURINARY: No dysuria, hematuria.  ENDOCRINE: No polyuria, nocturia,  HEMATOLOGY: No anemia, easy bruising or bleeding SKIN: No rash or lesion. MUSCULOSKELETAL: No joint pain or arthritis.   NEUROLOGIC: No tingling, numbness, weakness.  PSYCHIATRY: No anxiety or depression.   MEDICATIONS AT HOME:  Prior to Admission medications   Medication Sig Start Date End Date Taking? Authorizing Provider  acetaminophen (TYLENOL) 500 MG tablet Take 1,000 mg by mouth every 6 (six) hours as needed.    [provider]  amLODipine (NORVASC) 5 MG tablet Take 5 mg by mouth daily. 05/03/16   [provider]  aspirin EC 81 MG EC tablet Take 1 tablet (81 mg total) by mouth daily. 01/16/17   Hubbard Robinson, MD  Cholecalciferol (VITAMIN D3) 2000 units capsule Take 2,000 Units by mouth daily.    [provider]  docusate calcium (SURFAK) 240 MG capsule Take 1 capsule by mouth daily.    [provider]  dorzolamide-timolol (COSOPT) 22.3-6.8 MG/ML ophthalmic solution Apply 1 drop to eye 2 (two) times daily.    [provider]  ferrous gluconate (FERGON) 324 MG tablet Take 1 tablet (324 mg total) by mouth 2 (two) times daily with a meal. 01/06/17   Theodoro Grist, MD  ferrous gluconate (FERGON) 324 MG tablet Take by mouth. 01/06/17   [provider]  latanoprost (XALATAN) 0.005 % ophthalmic solution Apply 1 drop to eye at  bedtime.    [provider]  metFORMIN (GLUCOPHAGE-XR) 500 MG 24 hr tablet 1,000 mg daily with breakfast.  08/30/17   [provider]  Multiple Vitamins-Minerals (CENTRUM SILVER PO) Take by mouth every morning.    [provider]  Omega-3 Fatty Acids (FISH OIL) 1200 MG CAPS Take 2 capsules by mouth daily.    [provider]  pravastatin (PRAVACHOL) 40 MG tablet Take 40 mg by mouth daily. 05/03/16   [provider]  [facility] injection  08/15/17   [provider]      PHYSICAL EXAMINATION:   VITAL SIGNS: Blood pressure (!) 135/97, pulse 92, temperature 98.7 F (37.1 C), temperature source Oral, resp. rate 18, height 6' 3.75" (1.924 m), weight 134.7 kg (297 lb), SpO2 96 %.  GENERAL:  73 y.o.-year-old patient lying in the bed with no acute distress.  Obesity EYES: Pupils equal, round, reactive to light and accommodation. No  scleral icterus. Extraocular muscles intact.  HEENT: Head atraumatic, normocephalic. Oropharynx and nasopharynx clear.  NECK:  Supple, no jugular venous distention. No thyroid enlargement, no tenderness.  LUNGS: Normal breath sounds bilaterally, no wheezing, rales,rhonchi or crepitation. No use of accessory muscles of respiration.  CARDIOVASCULAR: S1, S2 normal. No murmurs, rubs, or gallops.  ABDOMEN: Soft, nontender, nondistended. Bowel sounds present. No organomegaly or mass.  EXTREMITIES: No pedal edema, cyanosis, or clubbing.  NEUROLOGIC: Cranial nerves II through XII are intact. Muscle strength 5/5 in all extremities. Sensation intact. Gait not checked.  PSYCHIATRIC: The patient is alert and oriented x 3.  SKIN: No obvious rash, lesion, or ulcer.   LABORATORY PANEL:   CBC Recent Labs  Lab 02/16/18 1952  WBC 9.6  HGB 15.5  HCT 47.0  PLT 275  MCV 85.7  MCH 28.3  MCHC 33.0  RDW 14.4    ------------------------------------------------------------------------------------------------------------------  Chemistries  Recent Labs  Lab 02/16/18 1952  NA 138  K 4.2  CL 102  CO2 27  GLUCOSE 152*  BUN 14  CREATININE 0.91  CALCIUM 10.1  AST 17  ALT 18  ALKPHOS 78  BILITOT 0.8   ------------------------------------------------------------------------------------------------------------------ estimated creatinine clearance is 108 mL/min (by C-G formula based on SCr of 0.91 mg/dL). ------------------------------------------------------------------------------------------------------------------ No results for input(s): TSH, T4TOTAL, T3FREE, THYROIDAB in the last 72 hours.  Invalid input(s): FREET3   Coagulation profile No results for input(s): INR, PROTIME in the last 168 hours. ------------------------------------------------------------------------------------------------------------------- No results for input(s): DDIMER in the last 72 hours. -------------------------------------------------------------------------------------------------------------------  Cardiac Enzymes Recent Labs  Lab 02/16/18 1952  TROPONINI <0.03   ------------------------------------------------------------------------------------------------------------------ Invalid input(s): POCBNP  ---------------------------------------------------------------------------------------------------------------  Urinalysis    Component Value Date/Time   COLORURINE STRAW (A) 01/08/2017 0547   APPEARANCEUR CLEAR (A) 01/08/2017 0547   APPEARANCEUR Clear 03/10/2014 0744   LABSPEC 1.005 01/08/2017 0547   LABSPEC 1.008 03/10/2014 0744   PHURINE 7.0 01/08/2017 0547   GLUCOSEU 50 (A) 01/08/2017 0547   GLUCOSEU Negative 03/10/2014 0744   HGBUR NEGATIVE 01/08/2017 0547   BILIRUBINUR NEGATIVE 01/08/2017 0547   BILIRUBINUR Negative 03/10/2014 [place] 01/08/2017 0547   PROTEINUR  NEGATIVE 01/08/2017 0547   NITRITE NEGATIVE 01/08/2017 0547   LEUKOCYTESUR NEGATIVE 01/08/2017 0547   LEUKOCYTESUR Trace 03/10/2014 0744     RADIOLOGY: No results found.  EKG: Orders placed or performed during the hospital encounter of 02/16/18  . ED EKG  . ED EKG  . EKG 12-Lead  . 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 Management plans discussed with the patient, family and they are in agreement.  CODE STATUS:full Code Status History    Date Active Date Inactive Code Status Order ID Comments User Context   01/08/2017 03:14 01/15/2017 21:16 Full Code [id number]  Florene Glen, MD Inpatient   12/27/2016  11:54 01/06/2017 17:26 Full Code [id number]  Hillary Bow, MD ED       TOTAL TIME TAKING CARE OF THIS PATIENT: 45 minutes.    Avel Peace Salary M.D on 02/16/2018   Between 7am to 6pm - Pager - [phone]  After 6pm go to [email or web address] - password [email or web address] [place] [facility]  Office  [phone]  CC: Primary care physician; Ezequiel Kayser, MD   Note: This dictation was prepared with Dragon dictation along with smaller phrase technology. Any transcriptional errors that result from this process are unintentional.

## 2018-02-16 NOTE — ED Triage Notes (Addendum)
Pt reports 2 episodes of rectal bleeding tonight; pt says this has happened twice before and had to be admitted both times for diverticulitis; had to have abd surgery, one visit staying 18 days and requiring a large amount of blood transfusions; denies pain at this time; denies N/V/D; pt denies feeling weak; last colonoscopy in 9/18 and had 1-2 polyps removed; pt alert and oriented x 3; talking in complete coherent sentences

## 2018-02-16 NOTE — ED Provider Notes (Signed)
[facility] Emergency Department Provider Note    First MD Initiated Contact with Patient 02/16/18 2008     (approximate)  I have reviewed the triage vital signs and the nursing notes.   HISTORY  Chief Complaint Rectal Bleeding    HPI Evan [name] is a 73 y.o. male with a history of significant diverticulosis status post hemicolectomy due to persistent diverticular bleed status post colonoscopy in September with polypectomy not currently on anticoagulation presents to the ER after having several episodes of large volume hematochezia and clots.  States it started this afternoon.  Patient states that he was not having any chest pain lightheadedness or shortness of breath so he wanted to wait to see if it resolved.  Then had another episode so he came to the ER due to his complex history.  While in the waiting room for less than 20 minutes patient had another episode of bright red blood per rectum.  Still denies any fatigue chest pain or shortness of breath.  States he does take iron supplementation.  Denies any abdominal pain.  No history of hemorrhoids.  Past Medical History:  Diagnosis Date  . Adenoma of colon   . Arthritis    xDJD  . Atrial fibrillation ([place])   . BPH (benign prostatic hyperplasia)   . CHF (congestive heart failure) ([place])   . Diabetes mellitus without complication ([place])   . Discitis   . Diverticulitis   . Diverticulosis   . DVT (deep venous thrombosis) ([place])   . Erectile dysfunction   . Hearing loss   . Hyperlipidemia   . Hypertension   . Ischemic optic neuropathy   . Lower GI bleeding   . Non-ischemic cardiomyopathy ([place])   . Sleep apnea    Family History  Problem Relation Age of Onset  . Diabetes Sister   . Diabetes Brother   . Diabetes Sister   . Diabetes Brother    Past Surgical History:  Procedure Laterality Date  . ATRIAL ABLATION SURGERY    . CHOLECYSTECTOMY    . COLONOSCOPY WITH PROPOFOL N/A 09/25/2017   Procedure: COLONOSCOPY WITH PROPOFOL;  Surgeon: Manya Silvas, MD;  Location: [facility] ENDOSCOPY;  Service: Endoscopy;  Laterality: N/A;  . JOINT REPLACEMENT Right    knee  . LAPAROTOMY  01/01/2017   Procedure: EXPLORATORY LAPAROTOMY;  Surgeon: Olean Ree, MD;  Location: [facility] ORS;  Service: General;;  . PARTIAL COLECTOMY N/A 01/01/2017   Procedure: PARTIAL COLECTOMY;  Surgeon: Olean Ree, MD;  Location: [facility] ORS;  Service: General;  Laterality: N/A;  . PERIPHERAL VASCULAR CATHETERIZATION N/A 12/28/2016   Procedure: Visceral Artery Intervention;  Surgeon: Algernon Huxley, MD;  Location: [place] CV LAB;  Service: Cardiovascular;  Laterality: N/A;  . SECONDARY CLOSURE OF WOUND N/A 01/08/2017   Procedure: SECONDARY CLOSURE FOR EVISCERATION;  Surgeon: Florene Glen, MD;  Location: [facility] ORS;  Service: General;  Laterality: N/A;  . SIGMOIDECTOMY    . TOTAL KNEE ARTHROPLASTY Right    Patient Active Problem List   Diagnosis Date Noted  . Hematochezia 02/16/2018  . Acute deep vein thrombosis (DVT) of popliteal vein of right lower extremity ([place]) 01/24/2017  . 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 DVT (deep venous thrombosis) ([place]) 01/22/2017  . S/P partial colectomy 01/21/2017  . Acute posthemorrhagic anemia 01/06/2017  . Encounter for blood transfusion 01/06/2017  . Leukocytosis 01/06/2017  . Hypokalemia 01/06/2017  . History of GI diverticular bleed   . BRBPR (bright  red blood per rectum) 12/27/2016  . Stage 2 moderate COPD by GOLD classification ([place]) 05/03/2016  . High risk medication use 05/04/2015  . Sleep apnea 05/04/2015  . 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 Hearing loss 11/07/2014  . Hypercholesteremia 11/07/2014  . Hypertension 11/07/2014  . Lumbar degenerative disc disease 11/07/2014  . Proteinuria due to type 2 diabetes mellitus ([place]) 11/07/2014  . Arthrosis of knee 06/22/2014  . Degenerative joint  disease of knee, left 05/06/2014  . Status post total right knee replacement 05/06/2014  . History of adenomatous polyp of colon 01/31/2007      Prior to Admission medications   Medication Sig Start Date End Date Taking? Authorizing Provider  acetaminophen (TYLENOL) 500 MG tablet Take 1,000 mg by mouth every 6 (six) hours as needed.   Yes [provider]  amLODipine (NORVASC) 5 MG tablet Take 5 mg by mouth daily. 05/03/16  Yes [provider]  aspirin EC 81 MG EC tablet Take 1 tablet (81 mg total) by mouth daily. 01/16/17  Yes Loflin, Lavona Mound, MD  Cholecalciferol (VITAMIN D3) 2000 units capsule Take 2,000 Units by mouth daily.   Yes [provider]  docusate calcium (SURFAK) 240 MG capsule Take 1 capsule by mouth daily.   Yes [provider]  dorzolamide-timolol (COSOPT) 22.3-6.8 MG/ML ophthalmic solution Apply 1 drop to eye 2 (two) times daily.   Yes [provider]  ferrous gluconate (FERGON) 324 MG tablet Take 1 tablet (324 mg total) by mouth 2 (two) times daily with a meal. 01/06/17  Yes Theodoro Grist, MD  latanoprost (XALATAN) 0.005 % ophthalmic solution Apply 1 drop to eye at bedtime.   Yes [provider]  metFORMIN (GLUCOPHAGE-XR) 500 MG 24 hr tablet 1,000 mg daily with breakfast.  08/30/17  Yes [provider]  Multiple Vitamins-Minerals (CENTRUM SILVER PO) Take by mouth every morning.   Yes [provider]  Omega-3 Fatty Acids (FISH OIL) 1200 MG CAPS Take 2 capsules by mouth daily.   Yes [provider]  pravastatin (PRAVACHOL) 40 MG tablet Take 40 mg by mouth daily. 05/03/16  Yes [provider]  [facility] injection  08/15/17   [provider]    Allergies Azithromycin; Penicillins; Lisinopril; Losartan; and Metoprolol    Social History Social History   Tobacco Use  . Smoking status: Former Smoker    Packs/day: 0.25    Years: 29.00    Pack years: 7.25    Types: Cigarettes     Last attempt to quit: 12/31/1993    Years since quitting: 24.1  . Smokeless tobacco: Never Used  Substance Use Topics  . Alcohol use: No    Alcohol/week: 0.0 oz  . Drug use: No    Review of Systems Patient denies headaches, rhinorrhea, blurry vision, numbness, shortness of breath, chest pain, edema, cough, abdominal pain, nausea, vomiting, diarrhea, dysuria, fevers, rashes or hallucinations unless otherwise stated above in HPI. ____________________________________________   PHYSICAL EXAM:  VITAL SIGNS: Vitals:   02/16/18 2107 02/16/18 2233  BP: (!) 135/97 125/77  Pulse: 92 (!) 103  Resp: 18 18  Temp:  97.7 F (36.5 C)  SpO2: 96% 98%    Constitutional: Alert and oriented. Well appearing and in no acute distress. Eyes: Conjunctivae are normal.  Head: Atraumatic. Nose: No congestion/rhinnorhea. Mouth/Throat: Mucous membranes are moist.   Neck: No stridor. Painless ROM.  Cardiovascular: Normal rate, regular rhythm. Grossly normal heart sounds.  Good peripheral circulation.  Respiratory: Normal respiratory effort.  No retractions. Lungs CTAB. Gastrointestinal: Soft and nontender. No distention. No abdominal bruits. No CVA tenderness. Genitourinary: there is hematochezia at the rectum, no hemorrhoids noted, no melena Musculoskeletal: No lower extremity tenderness nor edema.  No joint effusions. Neurologic:  Normal speech and language. No gross focal neurologic deficits are appreciated. No facial droop Skin:  Skin is warm, dry and intact. No rash noted. Psychiatric: Mood and affect are normal. Speech and behavior are normal.  ____________________________________________   LABS (all labs ordered are listed, but only abnormal results are displayed)  Results for orders placed or performed during the hospital encounter of 02/16/18 (from the past 24 hour(s))  Type and screen [place]     Status: None   Collection Time: 02/16/18  7:43 PM  Result Value Ref  Range   ABO/RH(D) B POS    Antibody Screen NEG    Sample Expiration      02/19/2018 Performed at [facility], [address] [place], [place] [address]   Comprehensive metabolic panel     Status: Abnormal   Collection Time: 02/16/18  7:52 PM  Result Value Ref Range   Sodium 138 135 - 145 mmol/L   Potassium 4.2 3.5 - 5.1 mmol/L   Chloride 102 101 - 111 mmol/L   CO2 27 22 - 32 mmol/L   Glucose, Bld 152 (H) 65 - 99 mg/dL   BUN 14 6 - 20 mg/dL   Creatinine, Ser 0.91 0.61 - 1.24 mg/dL   Calcium 10.1 8.9 - 10.3 mg/dL   Total Protein 7.5 6.5 - 8.1 g/dL   Albumin 4.2 3.5 - 5.0 g/dL   AST 17 15 - 41 U/L   ALT 18 17 - 63 U/L   Alkaline Phosphatase 78 38 - 126 U/L   Total Bilirubin 0.8 0.3 - 1.2 mg/dL   GFR calc non Af Amer >60 >60 mL/min   GFR calc Af Amer >60 >60 mL/min   Anion gap 9 5 - 15  CBC     Status: None   Collection Time: 02/16/18  7:52 PM  Result Value Ref Range   WBC 9.6 3.8 - 10.6 K/uL   RBC 5.48 4.40 - 5.90 MIL/uL   Hemoglobin 15.5 13.0 - 18.0 g/dL   HCT 47.0 40.0 - 52.0 %   MCV 85.7 80.0 - 100.0 fL   MCH 28.3 26.0 - 34.0 pg   MCHC 33.0 32.0 - 36.0 g/dL   RDW 14.4 11.5 - 14.5 %   Platelets 275 150 - 440 K/uL  Troponin I     Status: None   Collection Time: 02/16/18  7:52 PM  Result Value Ref Range   Troponin I <0.03 <0.03 ng/mL   ____________________________________________  EKG My review and personal interpretation at Time: 20:58   Indication: hematochezia  Rate: 95  Rhythm: sinus Axis: normal Other: poor r wave progression, normal intervals ____________________________________________  RADIOLOGY   ____________________________________________   PROCEDURES  Procedure(s) performed:  Procedures    Critical Care performed: no ____________________________________________   INITIAL IMPRESSION / ASSESSMENT AND PLAN / ED COURSE  Pertinent labs & imaging results that were available during my care of the patient were reviewed by me and  considered in my medical decision making (see chart for details).  DDX: hemorrhoid, diverticular bleed, upper gi bleed, malignancy  Olanrewaju Osborn is a 73 y.o. who presents to the ED with multiple episodes of hematochezia as described above.  Fortunately is hemodynamically stable and  hemoglobin is elevated at 15.  Is not currently on any blood thinners.  His abdominal exam is soft and benign.  Do suspect most likely recurrent diverticular bleed versus possible polyp.  No evidence of hemorrhoid.  Based on his presentation patient will be admitted to the hospital for further medical management.  Have discussed with the patient and available family all diagnostics and treatments performed thus far and all questions were answered to the best of my ability. The patient demonstrates understanding and agreement with plan.       ____________________________________________   FINAL CLINICAL IMPRESSION(S) / ED DIAGNOSES  Final diagnoses:  Hematochezia      NEW MEDICATIONS STARTED DURING THIS VISIT:  Current Discharge Medication List       Note:  This document was prepared using Dragon voice recognition software and may include unintentional dictation errors.    Merlyn Lot, MD 02/16/18 2235

## 2018-02-17 DIAGNOSIS — K921 Melena: Principal | ICD-10-CM

## 2018-02-17 LAB — BASIC METABOLIC PANEL
ANION GAP: 8 (ref 5–15)
BUN: 21 mg/dL — ABNORMAL HIGH (ref 6–20)
CHLORIDE: 104 mmol/L (ref 101–111)
CO2: 26 mmol/L (ref 22–32)
Calcium: 9.5 mg/dL (ref 8.9–10.3)
Creatinine, Ser: 1.11 mg/dL (ref 0.61–1.24)
GFR calc Af Amer: >60 mL/min (ref >60)
GFR calc non Af Amer: >60 mL/min (ref >60)
GLUCOSE: 276 mg/dL — AB (ref 65–99)
POTASSIUM: 4.8 mmol/L (ref 3.5–5.1)
Sodium: 138 mmol/L (ref 135–145)

## 2018-02-17 LAB — CBC
HEMATOCRIT: 37.4 % — AB (ref 40.0–52.0)
Hemoglobin: 12.4 g/dL — ABNORMAL LOW (ref 13.0–18.0)
MCH: 28.4 pg (ref 26.0–34.0)
MCHC: 33.1 g/dL (ref 32.0–36.0)
MCV: 85.8 fL (ref 80.0–100.0)
Platelets: 244 10*3/uL (ref 150–440)
RBC: 4.36 MIL/uL — AB (ref 4.40–5.90)
RDW: 14.5 % (ref 11.5–14.5)
WBC: 11.5 10*3/uL — AB (ref 3.8–10.6)

## 2018-02-17 LAB — ABO/RH: ABO/RH(D): B POS

## 2018-02-17 LAB — GLUCOSE, CAPILLARY
GLUCOSE-CAPILLARY: 141 mg/dL — AB (ref 65–99)
Glucose-Capillary: 288 mg/dL — ABNORMAL HIGH (ref 65–99)
Glucose-Capillary: 179 mg/dL — ABNORMAL HIGH (ref 65–99)
Glucose-Capillary: 130 mg/dL — ABNORMAL HIGH (ref 65–99)
Glucose-Capillary: 134 mg/dL — ABNORMAL HIGH (ref 65–99)

## 2018-02-17 MED ORDER — AMLODIPINE BESYLATE 5 MG PO TABS
5.0000 mg | ORAL_TABLET | Freq: Every day | ORAL | Status: DC
  Administered 2018-02-17 – 2018-02-19 (×3): 5 mg via ORAL
  Filled 2018-02-17 (×3): qty 1

## 2018-02-17 MED ORDER — LATANOPROST 0.005 % OP SOLN
1.0000 [drp] | Freq: Every day | OPHTHALMIC | Status: DC
  Administered 2018-02-17 – 2018-02-18 (×2): 1 [drp] via OPHTHALMIC
  Filled 2018-02-17: qty 2.5

## 2018-02-17 MED ORDER — PEG 3350-KCL-NA BICARB-NACL 420 G PO SOLR
4000.0000 mL | Freq: Once | ORAL | Status: AC
  Administered 2018-02-17: 4000 mL via ORAL
  Filled 2018-02-17: qty 4000

## 2018-02-17 MED ORDER — PRAVASTATIN SODIUM 20 MG PO TABS
40.0000 mg | ORAL_TABLET | Freq: Every day | ORAL | Status: DC
  Administered 2018-02-17 – 2018-02-18 (×2): 40 mg via ORAL
  Filled 2018-02-17 (×2): qty 2

## 2018-02-17 MED ORDER — DORZOLAMIDE HCL-TIMOLOL MAL 2-0.5 % OP SOLN
1.0000 [drp] | Freq: Two times a day (BID) | OPHTHALMIC | Status: DC
  Administered 2018-02-17 – 2018-02-19 (×5): 1 [drp] via OPHTHALMIC
  Filled 2018-02-17: qty 10

## 2018-02-17 NOTE — Progress Notes (Signed)
Rapid Response Event Note  Overview:pt was noted to be sitting on floor in bathroom several nurses at bedside. Pt was s/p fall       Initial Focused Assessment:Pt was on floor noted to have had a bloody BM.pt was sitting upright awake and alert . Pt was assisted to w/c then to bed, family remains at bedside. Pt denies pain or discomfort he is now in bed AAOx4 with family at bedside  Interventions:hgb ordered . Pt BP 123/73.   Plan of Care (if not transferred): call lab results to MD. Pt needs to remain on bedrest  Event Summary:   at  2323    at          [facility]

## 2018-02-17 NOTE — Consult Note (Signed)
[name], MD [facility]  [address], [place], [place] [address] Phone: [phone] Fax : [phone]  Consultation  Referring Provider:     Dr. Benjie Karvonen Primary Care Physician:  Ezequiel Kayser, MD Primary Gastroenterologist:  Dr. Vira Agar         Reason for Consultation:     Rectal bleeding  Date of Admission:  02/16/2018 Date of Consultation:  02/17/2018         HPI:   Evan [name] is a 73 y.o. male who has a history of rectal bleeding in the past.  The patient has had diverticular bleeds with a resection of his colon.  The patient also had embolization by Dr. Lucky Cowboy.  The patient states that since his surgery he has not had any bleeding.  The patient did have dehiscence of his wound postop.  The patient now reports that he had rectal bleeding that stopped last night.  He has not had any more rectal bleeding or bowel movement since 2:30 am.  He does report some abdominal bloating.  The patient's hemoglobin on admission was 14.4 which has come down to 12.4.  The patient's last colonoscopy was in September 2018 5 months ago.  At that time the patient had diverticulosis and some colonic ulcerations.  Past Medical History:  Diagnosis Date  . Adenoma of colon   . Arthritis    xDJD  . Atrial fibrillation ([place])   . BPH (benign prostatic hyperplasia)   . CHF (congestive heart failure) ([place])   . Diabetes mellitus without complication ([place])   . Discitis   . Diverticulitis   . Diverticulosis   . DVT (deep venous thrombosis) ([place])   . Erectile dysfunction   . Hearing loss   . Hyperlipidemia   . Hypertension   . Ischemic optic neuropathy   . Lower GI bleeding   . Non-ischemic cardiomyopathy ([place])   . Sleep apnea     Past Surgical History:  Procedure Laterality Date  . ATRIAL ABLATION SURGERY    . CHOLECYSTECTOMY    . COLONOSCOPY WITH PROPOFOL N/A 09/25/2017   Procedure: COLONOSCOPY WITH PROPOFOL;  Surgeon: Manya Silvas, MD;  Location: [facility] ENDOSCOPY;  Service: Endoscopy;   Laterality: N/A;  . JOINT REPLACEMENT Right    knee  . LAPAROTOMY  01/01/2017   Procedure: EXPLORATORY LAPAROTOMY;  Surgeon: Olean Ree, MD;  Location: [facility] ORS;  Service: General;;  . PARTIAL COLECTOMY N/A 01/01/2017   Procedure: PARTIAL COLECTOMY;  Surgeon: Olean Ree, MD;  Location: [facility] ORS;  Service: General;  Laterality: N/A;  . PERIPHERAL VASCULAR CATHETERIZATION N/A 12/28/2016   Procedure: Visceral Artery Intervention;  Surgeon: Algernon Huxley, MD;  Location: [place] CV LAB;  Service: Cardiovascular;  Laterality: N/A;  . SECONDARY CLOSURE OF WOUND N/A 01/08/2017   Procedure: SECONDARY CLOSURE FOR EVISCERATION;  Surgeon: Florene Glen, MD;  Location: [facility] ORS;  Service: General;  Laterality: N/A;  . SIGMOIDECTOMY    . TOTAL KNEE ARTHROPLASTY Right     Prior to Admission medications   Medication Sig Start Date End Date Taking? Authorizing Provider  acetaminophen (TYLENOL) 500 MG tablet Take 1,000 mg by mouth every 6 (six) hours as needed.   Yes [provider]  amLODipine (NORVASC) 5 MG tablet Take 5 mg by mouth daily. 05/03/16  Yes [provider]  aspirin EC 81 MG EC tablet Take 1 tablet (81 mg total) by mouth daily. 01/16/17  Yes Hubbard Robinson, MD  Cholecalciferol (VITAMIN D3) 2000 units  capsule Take 2,000 Units by mouth daily.   Yes [provider]  docusate calcium (SURFAK) 240 MG capsule Take 1 capsule by mouth daily.   Yes [provider]  dorzolamide-timolol (COSOPT) 22.3-6.8 MG/ML ophthalmic solution Apply 1 drop to eye 2 (two) times daily.   Yes [provider]  ferrous gluconate (FERGON) 324 MG tablet Take 1 tablet (324 mg total) by mouth 2 (two) times daily with a meal. 01/06/17  Yes Theodoro Grist, MD  latanoprost (XALATAN) 0.005 % ophthalmic solution Apply 1 drop to eye at bedtime.   Yes [provider]  metFORMIN (GLUCOPHAGE-XR) 500 MG 24 hr tablet 1,000 mg daily with breakfast.  08/30/17  Yes [provider]  Multiple Vitamins-Minerals (CENTRUM SILVER PO) Take by mouth every morning.   Yes [provider]  Omega-3 Fatty Acids (FISH OIL) 1200 MG CAPS Take 2 capsules by mouth daily.   Yes [provider]  pravastatin (PRAVACHOL) 40 MG tablet Take 40 mg by mouth daily. 05/03/16  Yes [provider]  [facility] injection  08/15/17   [provider]    Family History  Problem Relation Age of Onset  . Diabetes Sister   . Diabetes Brother   . Diabetes Sister   . Diabetes Brother      Social History   Tobacco Use  . Smoking status: Former Smoker    Packs/day: 0.25    Years: 29.00    Pack years: 7.25    Types: Cigarettes    Last attempt to quit: 12/31/1993    Years since quitting: 24.1  . Smokeless tobacco: Never Used  Substance Use Topics  . Alcohol use: No    Alcohol/week: 0.0 oz  . Drug use: No    Allergies as of 02/16/2018 - Review Complete 02/16/2018  Allergen Reaction Noted  . Azithromycin Rash 07/09/2016  . Penicillins Rash 07/09/2016  . Lisinopril Cough 07/09/2016  . Losartan Cough 07/09/2016  . Metoprolol Other (See Comments) 07/09/2016    Review of Systems:    All systems reviewed and negative except where noted in HPI.   Physical Exam:  Vital signs in last 24 hours: Temp:  [97.7 F (36.5 C)-98.7 F (37.1 C)] 98.5 F (36.9 C) (02/18 0355) Pulse Rate:  [90-103] 91 (02/18 0355) Resp:  [18] 18 (02/18 0355) BP: (100-164)/(65-97) 108/77 (02/18 0355) SpO2:  [96 %-100 %] 100 % (02/18 0355) Weight:  [290 lb 9.6 oz (131.8 kg)-297 lb (134.7 kg)] 290 lb 9.6 oz (131.8 kg) (02/17 2219) Last BM Date: 02/16/18 General:   Pleasant, cooperative in NAD Head:  Normocephalic and atraumatic. Eyes:   No icterus.   Conjunctiva pink. PERRLA. Ears:  Normal auditory acuity. Neck:  Supple; no masses or thyroidomegaly Lungs: Respirations even and unlabored. Lungs clear to auscultation bilaterally.   No wheezes, crackles, or rhonchi.  Heart:   Regular rate and rhythm;  Without murmur, clicks, rubs or gallops Abdomen:  Soft, nondistended, nontender. Normal bowel sounds. No appreciable masses or hepatomegaly.  No rebound or guarding.  Rectal:  Not performed. Msk:  Symmetrical without gross deformities.   a history of diverticulosis with a colon resection who now comes in with bleeding per rectum. Extremities:  Without edema, cyanosis or clubbing. Neurologic:  Alert and oriented x3;  grossly normal neurologically. Skin:  Intact without significant lesions or rashes. Cervical Nodes:  No significant cervical adenopathy. Psych:  Alert and cooperative. Normal affect.  LAB RESULTS: Recent Labs    02/16/18 1952 02/16/18 2328 02/17/18  0350  WBC 9.6  --  11.5*  HGB 15.5 13.5 12.4*  HCT 47.0 41.4 37.4*  PLT 275  --  244   BMET Recent Labs    02/16/18 1952 02/17/18 0350  NA 138 138  K 4.2 4.8  CL 102 104  CO2 27 26  GLUCOSE 152* 276*  BUN 14 21*  CREATININE 0.91 1.11  CALCIUM 10.1 9.5   LFT Recent Labs    02/16/18 1952  PROT 7.5  ALBUMIN 4.2  AST 17  ALT 18  ALKPHOS 78  BILITOT 0.8   PT/INR No results for input(s): LABPROT, INR in the last 72 hours.  STUDIES: No results found.    Impression / Plan:   Evan [name] is a 73 y.o. y/o male a history of diverticulosis with bleeding and colonic resection with wound dehiscence.  The patient had a colonoscopy showing diverticulosis and colonic ulcers 5 months ago.  The patient will have a repeat colonoscopy tomorrow to look for any treatable cause for his present episode of rectal bleeding.  The patient will be prepped today for the colonoscopy for tomorrow.  The patient and his family have been explained the plan and agree with it.  Thank you for involving me in the care of this patient.      LOS: 1 day   [name], MD  02/17/2018, 12:20 PM   Note: This dictation was prepared with Dragon dictation along with smaller phrase technology. Any transcriptional  errors that result from this process are unintentional.

## 2018-02-17 NOTE — Consult Note (Signed)
Surgical Consultation  02/17/2018  Evan [name] is an 73 y.o. male.   Referring Physician:Mody  ZO:[id number]  HPI: Syncopal ep9isode, bloody stools. None since 0200 today (12 hrs). No n/v Pt known to me from prior episode and colon resection with evisceration and repair.  Past Medical History:  Diagnosis Date  . Adenoma of colon   . Arthritis    xDJD  . Atrial fibrillation ([place])   . BPH (benign prostatic hyperplasia)   . CHF (congestive heart failure) ([place])   . Diabetes mellitus without complication ([place])   . Discitis   . Diverticulitis   . Diverticulosis   . DVT (deep venous thrombosis) ([place])   . Erectile dysfunction   . Hearing loss   . Hyperlipidemia   . Hypertension   . Ischemic optic neuropathy   . Lower GI bleeding   . Non-ischemic cardiomyopathy ([place])   . Sleep apnea     Past Surgical History:  Procedure Laterality Date  . ATRIAL ABLATION SURGERY    . CHOLECYSTECTOMY    . COLONOSCOPY WITH PROPOFOL N/A 09/25/2017   Procedure: COLONOSCOPY WITH PROPOFOL;  Surgeon: Manya Silvas, MD;  Location: [facility] ENDOSCOPY;  Service: Endoscopy;  Laterality: N/A;  . JOINT REPLACEMENT Right    knee  . LAPAROTOMY  01/01/2017   Procedure: EXPLORATORY LAPAROTOMY;  Surgeon: Olean Ree, MD;  Location: [facility] ORS;  Service: General;;  . PARTIAL COLECTOMY N/A 01/01/2017   Procedure: PARTIAL COLECTOMY;  Surgeon: Olean Ree, MD;  Location: [facility] ORS;  Service: General;  Laterality: N/A;  . PERIPHERAL VASCULAR CATHETERIZATION N/A 12/28/2016   Procedure: Visceral Artery Intervention;  Surgeon: Algernon Huxley, MD;  Location: [place] CV LAB;  Service: Cardiovascular;  Laterality: N/A;  . SECONDARY CLOSURE OF WOUND N/A 01/08/2017   Procedure: SECONDARY CLOSURE FOR EVISCERATION;  Surgeon: Florene Glen, MD;  Location: [facility] ORS;  Service: General;  Laterality: N/A;  . SIGMOIDECTOMY    . TOTAL KNEE ARTHROPLASTY Right     Family History  Problem Relation Age of Onset  . Diabetes  Sister   . Diabetes Brother   . Diabetes Sister   . Diabetes Brother     Social History:  reports that he quit smoking about 24 years ago. His smoking use included cigarettes. He has a 7.25 pack-year smoking history. he has never used smokeless tobacco. He reports that he does not drink alcohol or use drugs.  Allergies:  Allergies  Allergen Reactions  . Azithromycin Rash  . Penicillins Rash    Has patient had a PCN reaction causing immediate rash, facial/tongue/throat swelling, SOB or lightheadedness with hypotension: Yes Has patient had a PCN reaction causing severe rash involving mucus membranes or skin necrosis: Yes Has patient had a PCN reaction that required hospitalization No Has patient had a PCN reaction occurring within the last 10 years: No If all of the above answers are "NO", then may proceed with Cephalosporin use. Has patient had a PCN reaction causing immediate rash, facial/tongue/throat swelling, SOB or lightheadedness with hypotension: Yes Has patient had a PCN reaction causing severe rash involving mucus membranes or skin necrosis: Yes Has patient had a PCN reaction that required hospitalization No Has patient had a PCN reaction occurring within the last 10 years: No If all of the above answers are "NO", then may proceed with Cephalosporin use.   [name] Lisinopril Cough  . Losartan Cough  . Metoprolol Other (See Comments)    Bradycardia.    Medications reviewed.   Review of  Systems:   Review of Systems  Constitutional: Negative.   HENT: Negative.   Eyes: Negative.   Respiratory: Negative.   Cardiovascular: Negative.   Gastrointestinal: Positive for blood in stool and melena. Negative for abdominal pain, constipation, diarrhea, heartburn, nausea and vomiting.  Genitourinary: Negative.   Musculoskeletal: Negative.   Skin: Negative.   Neurological: Negative.   Endo/Heme/Allergies: Negative.   Psychiatric/Behavioral: Negative.      Physical Exam:  BP  118/72 (BP Location: Right Arm)   Pulse 93   Temp 98.7 F (37.1 C) (Oral)   Resp 19   Ht 6' 4"  (1.93 m)   Wt 290 lb 9.6 oz (131.8 kg)   SpO2 98%   BMI 35.37 kg/m   Physical Exam  Constitutional: He is oriented to person, place, and time and well-developed, well-nourished, and in no distress. No distress.  Morbidly obese  HENT:  Head: Normocephalic and atraumatic.  Eyes: Pupils are equal, round, and reactive to light. Right eye exhibits no discharge. Left eye exhibits no discharge. No scleral icterus.  Neck: Normal range of motion.  Cardiovascular: Normal rate, regular rhythm and normal heart sounds.  Pulmonary/Chest: Effort normal. No respiratory distress.  Abdominal: Soft. He exhibits distension. There is no tenderness. There is no rebound and no guarding.  Scar and distension nontender  Musculoskeletal: Normal range of motion. He exhibits no edema.  Lymphadenopathy:    He has no cervical adenopathy.  Neurological: He is alert and oriented to person, place, and time.  Skin: Skin is warm and dry. No rash noted. He is not diaphoretic. No erythema.  Psychiatric: Mood and affect normal.  Vitals reviewed.     Results for orders placed or performed during the hospital encounter of 02/16/18 (from the past 48 hour(s))  Type and screen [place]     Status: None (Preliminary result)   Collection Time: 02/16/18  7:43 PM  Result Value Ref Range   ABO/RH(D) B POS    Antibody Screen NEG    Sample Expiration      02/19/2018 Performed at [facility], [address], [place], [place] [address]    Unit Number [id number]    Blood Component Type RED CELLS,LR    Unit division 00    Status of Unit ALLOCATED    Transfusion Status OK TO TRANSFUSE    Crossmatch Result Compatible    Unit Number [id number]    Blood Component Type RED CELLS,LR    Unit division 00    Status of Unit ALLOCATED    Transfusion Status OK TO TRANSFUSE    Crossmatch Result  Compatible   Comprehensive metabolic panel     Status: Abnormal   Collection Time: 02/16/18  7:52 PM  Result Value Ref Range   Sodium 138 135 - 145 mmol/L   Potassium 4.2 3.5 - 5.1 mmol/L   Chloride 102 101 - 111 mmol/L   CO2 27 22 - 32 mmol/L   Glucose, Bld 152 (H) 65 - 99 mg/dL   BUN 14 6 - 20 mg/dL   Creatinine, Ser 0.91 0.61 - 1.24 mg/dL   Calcium 10.1 8.9 - 10.3 mg/dL   Total Protein 7.5 6.5 - 8.1 g/dL   Albumin 4.2 3.5 - 5.0 g/dL   AST 17 15 - 41 U/L   ALT 18 17 - 63 U/L   Alkaline Phosphatase 78 38 - 126 U/L   Total Bilirubin 0.8 0.3 - 1.2 mg/dL   GFR calc non Af Amer >60 >60  mL/min   GFR calc Af Amer >60 >60 mL/min    Comment: (NOTE) The eGFR has been calculated using the CKD EPI equation. This calculation has not been validated in all clinical situations. eGFR's persistently <60 mL/min signify possible Chronic Kidney Disease.    Anion gap 9 5 - 15    Comment: Performed at [facility], [address] [place], [place] [address]  CBC     Status: None   Collection Time: 02/16/18  7:52 PM  Result Value Ref Range   WBC 9.6 3.8 - 10.6 K/uL   RBC 5.48 4.40 - 5.90 MIL/uL   Hemoglobin 15.5 13.0 - 18.0 g/dL   HCT 47.0 40.0 - 52.0 %   MCV 85.7 80.0 - 100.0 fL   MCH 28.3 26.0 - 34.0 pg   MCHC 33.0 32.0 - 36.0 g/dL   RDW 14.4 11.5 - 14.5 %   Platelets 275 150 - 440 K/uL    Comment: Performed at [facility], [address] [place], [place] [address]  Troponin I     Status: None   Collection Time: 02/16/18  7:52 PM  Result Value Ref Range   Troponin I <0.03 <0.03 ng/mL    Comment: Performed at [facility], [address] [place], [place] [address]  Glucose, capillary     Status: Abnormal   Collection Time: 02/16/18 10:41 PM  Result Value Ref Range   Glucose-Capillary 248 (H) 65 - 99 mg/dL   Comment 1 Notify RN   Hemoglobin and hematocrit, blood     Status: None   Collection Time: 02/16/18 11:28 PM  Result Value Ref Range   Hemoglobin  13.5 13.0 - 18.0 g/dL   HCT 41.4 40.0 - 52.0 %    Comment: Performed at [facility], [address] [place], [place] [address]  Glucose, capillary     Status: Abnormal   Collection Time: 02/17/18  1:15 AM  Result Value Ref Range   Glucose-Capillary 288 (H) 65 - 99 mg/dL  CBC     Status: Abnormal   Collection Time: 02/17/18  3:50 AM  Result Value Ref Range   WBC 11.5 (H) 3.8 - 10.6 K/uL   RBC 4.36 (L) 4.40 - 5.90 MIL/uL   Hemoglobin 12.4 (L) 13.0 - 18.0 g/dL   HCT 37.4 (L) 40.0 - 52.0 %   MCV 85.8 80.0 - 100.0 fL   MCH 28.4 26.0 - 34.0 pg   MCHC 33.1 32.0 - 36.0 g/dL   RDW 14.5 11.5 - 14.5 %   Platelets 244 150 - 440 K/uL    Comment: Performed at [facility], [address] [place], [place] [address]  Basic metabolic panel     Status: Abnormal   Collection Time: 02/17/18  3:50 AM  Result Value Ref Range   Sodium 138 135 - 145 mmol/L   Potassium 4.8 3.5 - 5.1 mmol/L   Chloride 104 101 - 111 mmol/L   CO2 26 22 - 32 mmol/L   Glucose, Bld 276 (H) 65 - 99 mg/dL   BUN 21 (H) 6 - 20 mg/dL   Creatinine, Ser 1.11 0.61 - 1.24 mg/dL   Calcium 9.5 8.9 - 10.3 mg/dL   GFR calc non Af Amer >60 >60 mL/min   GFR calc Af Amer >60 >60 mL/min    Comment: (NOTE) The eGFR has been calculated using the CKD EPI equation. This calculation has not been validated in all clinical situations. eGFR's persistently <60 mL/min signify  possible Chronic Kidney Disease.    Anion gap 8 5 - 15    Comment: Performed at [facility], [address] [place], [place] [address]  ABO/Rh     Status: None   Collection Time: 02/17/18  3:50 AM  Result Value Ref Range   ABO/RH(D)      B POS Performed at [facility], [address] [place], [place] [address]   Prepare RBC     Status: None   Collection Time: 02/17/18  3:50 AM  Result Value Ref Range   Order Confirmation      ORDER PROCESSED BY BLOOD BANK Performed at [facility], [address]  [place], [place] [address]   Glucose, capillary     Status: Abnormal   Collection Time: 02/17/18  7:44 AM  Result Value Ref Range   Glucose-Capillary 179 (H) 65 - 99 mg/dL  Glucose, capillary     Status: Abnormal   Collection Time: 02/17/18 11:57 AM  Result Value Ref Range   Glucose-Capillary 141 (H) 65 - 99 mg/dL   No results found.  Assessment/Plan:  Colon, LGIbleed Prior colon res for ischemia seen on path. Pt for endoscopy tomorrow Will follow, no sign of active bleeding right now  Florene Glen, MD, FACS

## 2018-02-17 NOTE — Progress Notes (Signed)
Patient was assisted to bathroom by NT, when standing at sink to wash his hands after using bathroom patient felt dizzy and passed out while NT was present in the bathroom . NT assisted patient to floor and called for help. A rapid response was called . Patient was assisted to a staxi chair and assisted back to bed. Patient and wife were educated that patient should use bedpan until further advised. Patient and wife in agreement. Terrial Rhodes

## 2018-02-17 NOTE — Progress Notes (Signed)
[place] [facility] [place] NAME: Franki Stemen    MR#:  [id number]  DATE OF BIRTH:  Apr 21, 1945  SUBJECTIVE:   Patient without bloody stools this morning, but has not had a bowel movement  Rapid response called early this morning because patient felt dizzy and may have passed out while going to the bathroom.  No complaints this morning.  REVIEW OF SYSTEMS:    Review of Systems  Constitutional: Negative for fever, chills weight loss HENT: Negative for ear pain, nosebleeds, congestion, facial swelling, rhinorrhea, neck pain, neck stiffness and ear discharge.   Respiratory: Negative for cough, shortness of breath, wheezing  Cardiovascular: Negative for chest pain, palpitations and leg swelling.  Gastrointestinal: Patient presented with hematochezia. Denies abdominal pain and constipation or diarrhea Genitourinary: Negative for dysuria, urgency, frequency, hematuria Musculoskeletal: Negative for back pain or joint pain Neurological: Negative for dizziness, seizures, syncope, focal weakness,  numbness and headaches.  Hematological: Does not bruise/bleed easily.  Psychiatric/Behavioral: Negative for hallucinations, confusion, dysphoric mood    Tolerating Diet: yes      DRUG ALLERGIES:   Allergies  Allergen Reactions  . Azithromycin Rash  . Penicillins Rash    Has patient had a PCN reaction causing immediate rash, facial/tongue/throat swelling, SOB or lightheadedness with hypotension: Yes Has patient had a PCN reaction causing severe rash involving mucus membranes or skin necrosis: Yes Has patient had a PCN reaction that required hospitalization No Has patient had a PCN reaction occurring within the last 10 years: No If all of the above answers are "NO", then may proceed with Cephalosporin use. Has patient had a PCN reaction causing immediate rash, facial/tongue/throat swelling, SOB or lightheadedness with hypotension: Yes Has patient had a  PCN reaction causing severe rash involving mucus membranes or skin necrosis: Yes Has patient had a PCN reaction that required hospitalization No Has patient had a PCN reaction occurring within the last 10 years: No If all of the above answers are "NO", then may proceed with Cephalosporin use.   [name] Lisinopril Cough  . Losartan Cough  . Metoprolol Other (See Comments)    Bradycardia.    VITALS:  Blood pressure 108/77, pulse 91, temperature 98.5 F (36.9 C), temperature source Oral, resp. rate 18, height 6\' 4"  (1.93 m), weight 131.8 kg (290 lb 9.6 oz), SpO2 100 %.  PHYSICAL EXAMINATION:  Constitutional: Appears well-developed and well-nourished. No distress. HENT: Normocephalic. [name] Oropharynx is clear and moist.  Eyes: Conjunctivae and EOM are normal. PERRLA, no scleral icterus.  Neck: Normal ROM. Neck supple. No JVD. No tracheal deviation. CVS: RRR, S1/S2 +, no murmurs, no gallops, no carotid bruit.  Pulmonary: Effort and breath sounds normal, no stridor, rhonchi, wheezes, rales.  Abdominal: Soft. BS +,  no distension, tenderness, rebound or guarding.  Musculoskeletal: Normal range of motion. No edema and no tenderness.  Neuro: Alert. CN 2-12 grossly intact. No focal deficits. Skin: Skin is warm and dry. No rash noted. Psychiatric: Normal mood and affect.      LABORATORY PANEL:   CBC Recent Labs  Lab 02/17/18 0350  WBC 11.5*  HGB 12.4*  HCT 37.4*  PLT 244   ------------------------------------------------------------------------------------------------------------------  Chemistries  Recent Labs  Lab 02/16/18 1952 02/17/18 0350  NA 138 138  K 4.2 4.8  CL 102 104  CO2 27 26  GLUCOSE 152* 276*  BUN 14 21*  CREATININE 0.91 1.11  CALCIUM 10.1 9.5  AST 17  --   ALT 18  --  ALKPHOS 78  --   BILITOT 0.8  --    ------------------------------------------------------------------------------------------------------------------  Cardiac Enzymes Recent Labs  Lab  02/16/18 1952  TROPONINI <0.03   ------------------------------------------------------------------------------------------------------------------  RADIOLOGY:  No results found.   ASSESSMENT AND PLAN:   73 year old male with history of diverticulosis status post partial colectomy January 2017 who presents with bright red blood per rectum.  1. Bright red blood per rectum: Hemoglobin remained stable GI and surgery evaluation pending Continue to monitor hemoglobin.  2. COPD without signs of exacerbation  3. OSA: Continue CPAP at night  4. Diabetes: Continue sliding scale  5. Essential hypertension: Continue Norvasc  6. Hyperlipidemia: Continue statin    Management plans discussed with the patient and he is in agreement.  CODE STATUS: full  TOTAL TIME TAKING CARE OF THIS PATIENT: 30 minutes.     POSSIBLE D/C tomorrow, DEPENDING ON CLINICAL CONDITION.   Yousif Edelson M.D on 02/17/2018 at 10:17 AM  Between 7am to 6pm - Pager - [phone] After 6pm go to [email or web address] - password [email or web address] [facility]  Office  [phone]  CC: Primary care physician; Ezequiel Kayser, MD  Note: This dictation was prepared with Dragon dictation along with smaller phrase technology. Any transcriptional errors that result from this process are unintentional.

## 2018-02-18 ENCOUNTER — Encounter
Admission: EM | Disposition: A | Discharge status: Home / Self Care | Payer: Self-pay | Source: Home / Self Care | Attending: Internal Medicine

## 2018-02-18 ENCOUNTER — Encounter: Payer: Self-pay | Admitting: Certified Registered Nurse Anesthetist

## 2018-02-18 ENCOUNTER — Inpatient Hospital Stay: Payer: Medicare HMO | Admitting: Certified Registered Nurse Anesthetist

## 2018-02-18 ENCOUNTER — Inpatient Hospital Stay: Payer: Medicare HMO

## 2018-02-18 HISTORY — PX: COLONOSCOPY WITH PROPOFOL: SHX5780

## 2018-02-18 LAB — GLUCOSE, CAPILLARY
GLUCOSE-CAPILLARY: 95 mg/dL (ref 65–99)
GLUCOSE-CAPILLARY: 178 mg/dL — AB (ref 65–99)
Glucose-Capillary: 135 mg/dL — ABNORMAL HIGH (ref 65–99)
Glucose-Capillary: 112 mg/dL — ABNORMAL HIGH (ref 65–99)
Glucose-Capillary: 165 mg/dL — ABNORMAL HIGH (ref 65–99)

## 2018-02-18 LAB — CBC
HEMATOCRIT: 31.4 % — AB (ref 40.0–52.0)
HEMOGLOBIN: 10.4 g/dL — AB (ref 13.0–18.0)
MCH: 28.4 pg (ref 26.0–34.0)
MCHC: 33 g/dL (ref 32.0–36.0)
MCV: 85.9 fL (ref 80.0–100.0)
Platelets: 225 10*3/uL (ref 150–440)
RBC: 3.66 MIL/uL — ABNORMAL LOW (ref 4.40–5.90)
RDW: 14.7 % — ABNORMAL HIGH (ref 11.5–14.5)
WBC: 10.3 10*3/uL (ref 3.8–10.6)

## 2018-02-18 SURGERY — COLONOSCOPY WITH PROPOFOL
Anesthesia: General

## 2018-02-18 MED ORDER — LIDOCAINE HCL (PF) 2 % IJ SOLN
INTRAMUSCULAR | Status: AC
  Filled 2018-02-18: qty 10

## 2018-02-18 MED ORDER — SODIUM CHLORIDE 0.9 % IV SOLN
INTRAVENOUS | Status: DC
  Administered 2018-02-18: 1000 mL via INTRAVENOUS

## 2018-02-18 MED ORDER — PROPOFOL 10 MG/ML IV BOLUS
INTRAVENOUS | Status: DC | PRN
  Administered 2018-02-18: 100 mg via INTRAVENOUS

## 2018-02-18 MED ORDER — PROPOFOL 500 MG/50ML IV EMUL
INTRAVENOUS | Status: AC
  Filled 2018-02-18: qty 50

## 2018-02-18 MED ORDER — LIDOCAINE HCL (CARDIAC) 20 MG/ML IV SOLN
INTRAVENOUS | Status: DC | PRN
  Administered 2018-02-18: 50 mg via INTRAVENOUS

## 2018-02-18 MED ORDER — PROPOFOL 10 MG/ML IV BOLUS
INTRAVENOUS | Status: AC
  Filled 2018-02-18: qty 20

## 2018-02-18 MED ORDER — PHENYLEPHRINE HCL 10 MG/ML IJ SOLN
INTRAMUSCULAR | Status: DC | PRN
  Administered 2018-02-18: 200 ug via INTRAVENOUS
  Administered 2018-02-18: 100 ug via INTRAVENOUS
  Administered 2018-02-18: 200 ug via INTRAVENOUS
  Administered 2018-02-18: 100 ug via INTRAVENOUS

## 2018-02-18 MED ORDER — PROPOFOL 500 MG/50ML IV EMUL
INTRAVENOUS | Status: DC | PRN
  Administered 2018-02-18: 140 ug/kg/min via INTRAVENOUS

## 2018-02-18 NOTE — Progress Notes (Signed)
PT Cancellation Note  Patient Details Name: Evan [name] MRN: [id number] DOB: 1945-12-15   Cancelled Treatment:    Reason Eval/Treat Not Completed: Other (comment).  PT consult received.  Chart reviewed.  Pt pending CT L knee (to further evaluate concern for fx; pain and difficulty bearing weight per chart).  Will hold PT at this time (until results are known) and re-attempt PT evaluation at a later date/time as medically appropriate.  Leitha Bleak, PT 02/18/18, 12:25 PM [phone]

## 2018-02-18 NOTE — Anesthesia Post-op Follow-up Note (Signed)
Anesthesia QCDR form completed.        

## 2018-02-18 NOTE — Anesthesia Preprocedure Evaluation (Signed)
Anesthesia Evaluation  Patient identified by MRN, date of birth, ID band Patient awake    Reviewed: Allergy & Precautions, H&P , NPO status , Patient's Chart, lab work & pertinent test results, reviewed documented beta blocker date and time   Airway Mallampati: II   Neck ROM: full    Dental  (+) Poor Dentition   Pulmonary neg pulmonary ROS, sleep apnea and Continuous Positive Airway Pressure Ventilation , COPD, former smoker,    Pulmonary exam normal        Cardiovascular Exercise Tolerance: Poor hypertension, On Medications + Peripheral Vascular Disease and +CHF  negative cardio ROS Normal cardiovascular exam Rhythm:regular Rate:Normal     Neuro/Psych negative neurological ROS  negative psych ROS   GI/Hepatic negative GI ROS, Neg liver ROS,   Endo/Other  negative endocrine ROSdiabetesMorbid obesity  Renal/GU Renal diseasenegative Renal ROS  negative genitourinary   Musculoskeletal   Abdominal   Peds  Hematology negative hematology ROS (+) anemia ,   Anesthesia Other Findings Past Medical History: No date: Adenoma of colon No date: Arthritis     Comment:  xDJD No date: Atrial fibrillation ([facility]) No date: BPH (benign prostatic hyperplasia) No date: CHF (congestive heart failure) ([facility]) No date: Diabetes mellitus without complication ([facility]) No date: Discitis No date: Diverticulitis No date: Diverticulosis No date: DVT (deep venous thrombosis) ([facility]) No date: Erectile dysfunction No date: Hearing loss No date: Hyperlipidemia No date: Hypertension No date: Ischemic optic neuropathy No date: Lower GI bleeding No date: Non-ischemic cardiomyopathy ([place]) No date: Sleep apnea Past Surgical History: No date: ATRIAL ABLATION SURGERY No date: CHOLECYSTECTOMY 09/25/2017: COLONOSCOPY WITH PROPOFOL; N/A     Comment:  Procedure: COLONOSCOPY WITH PROPOFOL;  Surgeon: Manya Silvas, MD;  Location: [facility]  ENDOSCOPY;  Service:               Endoscopy;  Laterality: N/A; No date: JOINT REPLACEMENT; Right     Comment:  knee 01/01/2017: LAPAROTOMY     Comment:  Procedure: EXPLORATORY LAPAROTOMY;  Surgeon: Olean Ree, MD;  Location: [facility] ORS;  Service: General;; 01/01/2017: PARTIAL COLECTOMY; N/A     Comment:  Procedure: PARTIAL COLECTOMY;  Surgeon: Olean Ree,               MD;  Location: [facility] ORS;  Service: General;  Laterality:               N/A; 12/28/2016: PERIPHERAL VASCULAR CATHETERIZATION; N/A     Comment:  Procedure: Visceral Artery Intervention;  Surgeon: Algernon Huxley, MD;  Location: [place] [facility] LAB;  Service:               Cardiovascular;  Laterality: N/A; 01/08/2017: SECONDARY CLOSURE OF WOUND; N/A     Comment:  Procedure: SECONDARY CLOSURE FOR EVISCERATION;  Surgeon:              Florene Glen, MD;  Location: [facility] ORS;  Service:               General;  Laterality: N/A; No date: SIGMOIDECTOMY No date: TOTAL KNEE ARTHROPLASTY; Right BMI    Body Mass Index:  35.37 kg/m     Reproductive/Obstetrics negative OB ROS  Anesthesia Physical Anesthesia Plan  ASA: III  Anesthesia Plan: General   Post-op Pain Management:    Induction:   PONV Risk Score and Plan:   Airway Management Planned:   Additional Equipment:   Intra-op Plan:   Post-operative Plan:   Informed Consent: I have reviewed the patients History and Physical, chart, labs and discussed the procedure including the risks, benefits and alternatives for the proposed anesthesia with the patient or authorized representative who has indicated his/her understanding and acceptance.   Dental Advisory Given  Plan Discussed with: CRNA  Anesthesia Plan Comments:         Anesthesia Quick Evaluation

## 2018-02-18 NOTE — Consult Note (Signed)
ORTHOPAEDIC CONSULTATION  REQUESTING PHYSICIAN: Bettey Costa, MD  Chief Complaint:   L knee pain  History of Present Illness: Evan [name] is a 73 y.o. male who had a syncopal fall 2 days ago in the hospital when he was in the bathroom.  He did not note significant knee pain prior to this injury, although he has known knee osteoarthritis and has had corticosteroid injections years ago previously in the knee.  Since this injury, he has had difficulty with weightbearing and has noted a large effusion.  Pain is worse with any sort of weightbearing and attempted full knee flexion.  Pain is improved at rest.  Pain is described as a dull and achy sensation at rest, but can be sharp with attempted weightbearing.  Pain is rated a 6 out of 10 in severity.  He is admitted to the hospital for a lower GI bleed and diverticulosis and therefore is not on any anti-inflammatory medications.  He denies any numbness or tingling, fevers and chills.  He has had a prior right total knee arthroplasty performed by Dr. Marry Guan in 2015.  So has a history of DVT.  Past Medical History:  Diagnosis Date  . Adenoma of colon   . Arthritis    xDJD  . Atrial fibrillation ([place])   . BPH (benign prostatic hyperplasia)   . CHF (congestive heart failure) ([place])   . Diabetes mellitus without complication ([place])   . Discitis   . Diverticulitis   . Diverticulosis   . DVT (deep venous thrombosis) ([place])   . Erectile dysfunction   . Hearing loss   . Hyperlipidemia   . Hypertension   . Ischemic optic neuropathy   . Lower GI bleeding   . Non-ischemic cardiomyopathy ([place])   . Sleep apnea    Past Surgical History:  Procedure Laterality Date  . ATRIAL ABLATION SURGERY    . CHOLECYSTECTOMY    . COLONOSCOPY WITH PROPOFOL N/A 09/25/2017   Procedure: COLONOSCOPY WITH PROPOFOL;  Surgeon: Manya Silvas, MD;  Location: [facility] ENDOSCOPY;  Service: Endoscopy;  Laterality:  N/A;  . JOINT REPLACEMENT Right    knee  . LAPAROTOMY  01/01/2017   Procedure: EXPLORATORY LAPAROTOMY;  Surgeon: Olean Ree, MD;  Location: [facility] ORS;  Service: General;;  . PARTIAL COLECTOMY N/A 01/01/2017   Procedure: PARTIAL COLECTOMY;  Surgeon: Olean Ree, MD;  Location: [facility] ORS;  Service: General;  Laterality: N/A;  . PERIPHERAL VASCULAR CATHETERIZATION N/A 12/28/2016   Procedure: Visceral Artery Intervention;  Surgeon: Algernon Huxley, MD;  Location: [facility] CV LAB;  Service: Cardiovascular;  Laterality: N/A;  . SECONDARY CLOSURE OF WOUND N/A 01/08/2017   Procedure: SECONDARY CLOSURE FOR EVISCERATION;  Surgeon: Florene Glen, MD;  Location: [facility] ORS;  Service: General;  Laterality: N/A;  . SIGMOIDECTOMY    . TOTAL KNEE ARTHROPLASTY Right    Social History   Socioeconomic History  . Marital status: Married    Spouse name: None  . Number of children: None  . Years of education: None  . Highest education level: None  Social Needs  . Financial resource strain: None  . Food insecurity - worry: None  . Food insecurity - inability: None  . Transportation needs - medical: None  . Transportation needs - non-medical: None  Occupational History  . None  Tobacco Use  . Smoking status: Former Smoker    Packs/day: 0.25    Years: 29.00    Pack years: 7.25    Types: Cigarettes  Last attempt to quit: 12/31/1993    Years since quitting: 24.1  . Smokeless tobacco: Never Used  Substance and Sexual Activity  . Alcohol use: No    Alcohol/week: 0.0 oz  . Drug use: No  . Sexual activity: None  Other Topics Concern  . None  Social History Narrative  . None   Family History  Problem Relation Age of Onset  . Diabetes Sister   . Diabetes Brother   . Diabetes Sister   . Diabetes Brother    Allergies  Allergen Reactions  . Azithromycin Rash  . Penicillins Rash    Has patient had a PCN reaction causing immediate rash, facial/tongue/throat swelling, SOB or lightheadedness with  hypotension: Yes Has patient had a PCN reaction causing severe rash involving mucus membranes or skin necrosis: Yes Has patient had a PCN reaction that required hospitalization No Has patient had a PCN reaction occurring within the last 10 years: No If all of the above answers are "NO", then may proceed with Cephalosporin use. Has patient had a PCN reaction causing immediate rash, facial/tongue/throat swelling, SOB or lightheadedness with hypotension: Yes Has patient had a PCN reaction causing severe rash involving mucus membranes or skin necrosis: Yes Has patient had a PCN reaction that required hospitalization No Has patient had a PCN reaction occurring within the last 10 years: No If all of the above answers are "NO", then may proceed with Cephalosporin use.   [name] Lisinopril Cough  . Losartan Cough  . Metoprolol Other (See Comments)    Bradycardia.   Prior to Admission medications   Medication Sig Start Date End Date Taking? Authorizing Provider  acetaminophen (TYLENOL) 500 MG tablet Take 1,000 mg by mouth every 6 (six) hours as needed.   Yes [provider]  amLODipine (NORVASC) 5 MG tablet Take 5 mg by mouth daily. 05/03/16  Yes [provider]  aspirin EC 81 MG EC tablet Take 1 tablet (81 mg total) by mouth daily. 01/16/17  Yes Loflin, Lavona Mound, MD  Cholecalciferol (VITAMIN D3) 2000 units capsule Take 2,000 Units by mouth daily.   Yes [provider]  docusate calcium (SURFAK) 240 MG capsule Take 1 capsule by mouth daily.   Yes [provider]  dorzolamide-timolol (COSOPT) 22.3-6.8 MG/ML ophthalmic solution Apply 1 drop to eye 2 (two) times daily.   Yes [provider]  ferrous gluconate (FERGON) 324 MG tablet Take 1 tablet (324 mg total) by mouth 2 (two) times daily with a meal. 01/06/17  Yes Theodoro Grist, MD  latanoprost (XALATAN) 0.005 % ophthalmic solution Apply 1 drop to eye at bedtime.   Yes [provider]  metFORMIN  (GLUCOPHAGE-XR) 500 MG 24 hr tablet 1,000 mg daily with breakfast.  08/30/17  Yes [provider]  Multiple Vitamins-Minerals (CENTRUM SILVER PO) Take by mouth every morning.   Yes [provider]  Omega-3 Fatty Acids (FISH OIL) 1200 MG CAPS Take 2 capsules by mouth daily.   Yes [provider]  pravastatin (PRAVACHOL) 40 MG tablet Take 40 mg by mouth daily. 05/03/16  Yes [provider]  [facility] injection  08/15/17   [provider]   Dg Knee 1-2 Views Left  Result Date: 02/18/2018 CLINICAL DATA:  Fall due to syncope, weakness in the left leg with difficulty bearing weight EXAM: LEFT KNEE - 1-2 VIEW COMPARISON:  None. FINDINGS: Severe osteoarthritis with full-thickness loss of articular space in the medial compartment and severe marginal spurring in all 3 compartments. Moderate knee  effusion is observed on the lateral projection. There is some faint linear calcification posterior to the femoral condyles on the lateral projection, probably vascular and less likely to be a free fragment in the joint. There is no well-defined fracture on this two view series. IMPRESSION: 1. Severe osteoarthritis including prominent spurring and full-thickness loss of articular cartilage in the medial compartment. 2. No appreciable fracture. Reduced sensitivity for subtle fractures due to the severity of spurring and cortical irregularities. 3. Moderate knee effusion. 4. Given that the patient is unable to bear weight, MRI or CT may be warranted for further characterization. Electronically Signed   By: Van Clines M.D.   On: 02/18/2018 09:41   Ct Knee Left Wo Contrast  Result Date: 02/18/2018 CLINICAL DATA:  Left knee pain after recent fall. EXAM: CT OF THE LEFT KNEE WITHOUT CONTRAST TECHNIQUE: Multidetector CT imaging of the left knee was performed according to the standard protocol. Multiplanar CT image reconstructions were also generated. COMPARISON:  Left knee x-rays from  same day. FINDINGS: Bones/Joint/Cartilage No acute fracture or dislocation. Moderate joint effusion. No evidence of lipohemarthrosis. Severe medial and mild lateral and patellofemoral compartment joint space narrowing with large marginal osteophytes. Ligaments Suboptimally assessed by CT. Muscles and Tendons Mild diffuse fatty muscle infiltration. The visualized tendons are unremarkable. The extensor mechanism is intact. Soft tissues Unremarkable. IMPRESSION: 1. No acute fracture or dislocation. Moderate joint effusion without evidence of lipohemarthrosis. 2. Tricompartmental osteoarthritis, severe in the medial compartment. Electronically Signed   By: Titus Dubin M.D.   On: 02/18/2018 13:02    Positive ROS: All other systems have been reviewed and were otherwise negative with the exception of those mentioned in the HPI and as above.  Physical Exam: BP 119/73 (BP Location: Right Arm)   Pulse 81   Temp 98 F (36.7 C) (Oral)   Resp 19   Ht 6\' 4"  (1.93 m)   Wt 131.8 kg (290 lb 9.6 oz)   SpO2 100%   BMI 35.37 kg/m  General:  Alert, no acute distress Psychiatric:  Patient is competent for consent with normal mood and affect   Cardiovascular:  No pedal edema, regular rate and rhythm Respiratory:  No wheezing, non-labored breathing GI:  Abdomen is soft and non-tender Skin:  No lesions in the area of chief complaint, no erythema Neurologic:  Sensation intact distally, CN grossly intact Lymphatic:  No axillary or cervical lymphadenopathy  Comprehensive Orthopedic Exam:  LLE: 5/5 DF/PF/EHL SILT s/s/t/sp/dp distr Foot wwp RoM Knee: 0-110, pain at end range of flexion TTP over medial joint line Varus deformity present 2+ effusion present.     X-rays & CT Scan:  As above: severe tricompartmental degenerative changes, most affecting the medial compartment with almost complete loss of joint space. No fractures or dislocations noted  Assessment/Plan: 73 yo M w/inreasing L knee pain after  syncopal fall in hospital 2 days ago.  1.  X-rays and CT scan both do not show any acute fracture and suggest severe degenerative changes, especially of the medial compartment.  I discussed these findings with the patient.  Discussed that this could be an exacerbation of his pre-existing arthritis versus new traumatic soft tissue injury which could include ligament or meniscus damage.  I discussed that we could consider a trial of meloxicam daily as an anti-inflammatory as it has a lower side effect profile versus a knee joint aspiration and injection of corticosteroid.  Patient elected to proceed with aspiration and injection after discussion of risks, benefits,  and alternatives.  Verbal consent was obtained.  Please see procedure note below for details.  The patient noted significant relief of pain after this procedure.  2.  Follow-up with me in approximately 4-6 weeks as an outpatient to monitor progress.  If pain returns sooner or if he has continued difficulty weightbearing, he is welcome to call to make an appointment sooner.    Procedure Note - Left Knee Aspiration/Injection I reviewed with the patient the procedure of L knee joint aspiration/injection and discussed the risks, benefits, and alternative treatments. We discussed the potential risks of infection, increased pain, and incomplete relief or temporary relief of symptoms. Verbal consent was obtained.  A time-out was conducted to verify correct patient identity, procedure to be performed, and correct side and site. The skin was marked and then prepped in usual sterile fashion. Superolateral approach was used. 5cc of lidocaine was injected into the subcutaneous tissue and into the tract down to the joint capsule. I then placed a 22g needle into the joint through this tract and I obtained ~35cc of bloody synovial fluid. The needle was left in place and a mixture of 1 mL of Kenalog 40mg , 2 mL of 1% Lidocaine, and 2 mL of 0.5% Ropivicaine into  the knee without complication. The patient tolerated the procedure adequately.     Leim Fabry   02/18/2018 4:43 PM

## 2018-02-18 NOTE — Anesthesia Postprocedure Evaluation (Signed)
Anesthesia Post Note  Patient: Evan [name]  Procedure(s) Performed: COLONOSCOPY WITH PROPOFOL (N/A )  Patient location during evaluation: PACU Anesthesia Type: General Level of consciousness: awake and alert Pain management: pain level controlled Vital Signs Assessment: post-procedure vital signs reviewed and stable Respiratory status: spontaneous breathing, nonlabored ventilation, respiratory function stable and patient connected to nasal cannula oxygen Cardiovascular status: blood pressure returned to baseline and stable Postop Assessment: no apparent nausea or vomiting Anesthetic complications: no     Last Vitals:  Vitals:   02/18/18 0651 02/18/18 0755  BP: 123/84 114/65  Pulse: 88   Resp: 18   Temp: (!) 35.7 C (!) 36.2 C  SpO2: 100%     Last Pain:  Vitals:   02/18/18 0755  TempSrc: Tympanic                 [name]

## 2018-02-18 NOTE — Op Note (Signed)
[facility] Gastroenterology Patient Name: Evan [name] Procedure Date: 02/18/2018 7:29 AM MRN: [id number] Account #: [id number] Date of Birth: Nov 24, 1945 Admit Type: Inpatient Age: 73 Room: [facility] ENDO ROOM 4 Gender: Male Note Status: Finalized Procedure:            Colonoscopy Indications:          Hematochezia Providers:            [name] MD, MD Referring MD:         Christena Flake. Raechel Ache, MD (Referring MD) Medicines:            Propofol per Anesthesia Complications:        No immediate complications. Procedure:            Pre-Anesthesia Assessment:                       - Prior to the procedure, a History and Physical was                        performed, and patient medications and allergies were                        reviewed. The patient's tolerance of previous                        anesthesia was also reviewed. The risks and benefits of                        the procedure and the sedation options and risks were                        discussed with the patient. All questions were                        answered, and informed consent was obtained. Prior                        Anticoagulants: The patient has taken no previous                        anticoagulant or antiplatelet agents. ASA Grade                        Assessment: II - A patient with mild systemic disease.                        After reviewing the risks and benefits, the patient was                        deemed in satisfactory condition to undergo the                        procedure.                       After obtaining informed consent, the colonoscope was                        passed under direct vision. Throughout the procedure,  the patient's blood pressure, pulse, and oxygen                        saturations were monitored continuously. The                        Colonoscope was introduced through the anus and                        advanced to the the  cecum, identified by appendiceal                        orifice and ileocecal valve. The colonoscopy was                        performed without difficulty. The patient tolerated the                        procedure well. The quality of the bowel preparation                        was good. Findings:      The perianal and digital rectal examinations were normal.      Multiple small-mouthed diverticula were found in the recto-sigmoid colon       and ascending colon.      There was evidence of a prior end-to-end colo-colonic anastomosis in the       sigmoid colon. This was patent and was characterized by healthy       appearing mucosa. The anastomosis was traversed. Impression:           - Diverticulosis in the recto-sigmoid colon and in the                        ascending colon.                       - Patent end-to-end colo-colonic anastomosis,                        characterized by healthy appearing mucosa.                       - No specimens collected.                       - There was more red blood seen on the left colon then                        the right colon without any active bleeding seen. Recommendation:       - Return patient to hospital ward for ongoing care.                       - If bleeding continues then would recommend a bleeding                        scan and evaluation by vascular surgery. Procedure Code(s):    --- Professional ---                       [phone], Colonoscopy, flexible; diagnostic, including  collection of specimen(s) by brushing or washing, when                        performed (separate procedure) Diagnosis Code(s):    --- Professional ---                       K92.1, Melena (includes Hematochezia)                       Z98.0, Intestinal bypass and anastomosis status CPT copyright 2016 American Medical Association. All rights reserved. The codes documented in this report are preliminary and upon coder review may  be  revised to meet current compliance requirements. [name] MD, MD 02/18/2018 7:54:34 AM This report has been signed electronically. Number of Addenda: 0 Note Initiated On: 02/18/2018 7:29 AM Scope Withdrawal Time: 0 hours 7 minutes 48 seconds  Total Procedure Duration: 0 hours 16 minutes 10 seconds       [facility]

## 2018-02-18 NOTE — Progress Notes (Signed)
[place] [facility] [place] NAME: Evan [name]    MR#:  [id number]  DATE OF BIRTH:  01-Sep-1945  SUBJECTIVE:   Patient had colonoscopy today  Old blood seen prior and an: Neck anastomosis in the sigmoid colon without bleeding.  REVIEW OF SYSTEMS:    Review of Systems  Constitutional: Negative for fever, chills weight loss HENT: Negative for ear pain, nosebleeds, congestion, facial swelling, rhinorrhea, neck pain, neck stiffness and ear discharge.   Respiratory: Negative for cough, shortness of breath, wheezing  Cardiovascular: Negative for chest pain, palpitations and leg swelling.  Gastrointestinal: Patient presented with hematochezia. Denies abdominal pain and constipation or diarrhea Genitourinary: Negative for dysuria, urgency, frequency, hematuria Musculoskeletal: Negative for back pain  Left knee pain after fall Neurological: Negative for dizziness, seizures, syncope, focal weakness,  numbness and headaches.  Hematological: Does not bruise/bleed easily.  Psychiatric/Behavioral: Negative for hallucinations, confusion, dysphoric mood    Tolerating Diet: yes      DRUG ALLERGIES:   Allergies  Allergen Reactions  . Azithromycin Rash  . Penicillins Rash    Has patient had a PCN reaction causing immediate rash, facial/tongue/throat swelling, SOB or lightheadedness with hypotension: Yes Has patient had a PCN reaction causing severe rash involving mucus membranes or skin necrosis: Yes Has patient had a PCN reaction that required hospitalization No Has patient had a PCN reaction occurring within the last 10 years: No If all of the above answers are "NO", then may proceed with Cephalosporin use. Has patient had a PCN reaction causing immediate rash, facial/tongue/throat swelling, SOB or lightheadedness with hypotension: Yes Has patient had a PCN reaction causing severe rash involving mucus membranes or skin necrosis: Yes Has patient had a  PCN reaction that required hospitalization No Has patient had a PCN reaction occurring within the last 10 years: No If all of the above answers are "NO", then may proceed with Cephalosporin use.   [name] Lisinopril Cough  . Losartan Cough  . Metoprolol Other (See Comments)    Bradycardia.    VITALS:  Blood pressure 124/75, pulse 81, temperature 97.9 F (36.6 C), temperature source Oral, resp. rate 19, height 6\' 4"  (1.93 m), weight 131.8 kg (290 lb 9.6 oz), SpO2 100 %.  PHYSICAL EXAMINATION:  Constitutional: Appears well-developed and well-nourished. No distress. HENT: Normocephalic. [name] Oropharynx is clear and moist.  Eyes: Conjunctivae and EOM are normal. PERRLA, no scleral icterus.  Neck: Normal ROM. Neck supple. No JVD. No tracheal deviation. CVS: RRR, S1/S2 +, no murmurs, no gallops, no carotid bruit.  Pulmonary: Effort and breath sounds normal, no stridor, rhonchi, wheezes, rales.  Abdominal: Soft. BS +,  no distension, tenderness, rebound or guarding.  Musculoskeletal: Left knee with some swelling but he does have range of motion however decreased  Neuro: Alert. CN 2-12 grossly intact. No focal deficits. Skin: Skin is warm and dry. No rash noted. Psychiatric: Normal mood and affect.      LABORATORY PANEL:   CBC Recent Labs  Lab 02/18/18 0430  WBC 10.3  HGB 10.4*  HCT 31.4*  PLT 225   ------------------------------------------------------------------------------------------------------------------  Chemistries  Recent Labs  Lab 02/16/18 1952 02/17/18 0350  NA 138 138  K 4.2 4.8  CL 102 104  CO2 27 26  GLUCOSE 152* 276*  BUN 14 21*  CREATININE 0.91 1.11  CALCIUM 10.1 9.5  AST 17  --   ALT 18  --   ALKPHOS 78  --   BILITOT 0.8  --    ------------------------------------------------------------------------------------------------------------------  Cardiac Enzymes Recent Labs  Lab 02/16/18 1952  TROPONINI <0.03    ------------------------------------------------------------------------------------------------------------------  RADIOLOGY:  Dg Knee 1-2 Views Left  Result Date: 02/18/2018 CLINICAL DATA:  Fall due to syncope, weakness in the left leg with difficulty bearing weight EXAM: LEFT KNEE - 1-2 VIEW COMPARISON:  None. FINDINGS: Severe osteoarthritis with full-thickness loss of articular space in the medial compartment and severe marginal spurring in all 3 compartments. Moderate knee effusion is observed on the lateral projection. There is some faint linear calcification posterior to the femoral condyles on the lateral projection, probably vascular and less likely to be a free fragment in the joint. There is no well-defined fracture on this two view series. IMPRESSION: 1. Severe osteoarthritis including prominent spurring and full-thickness loss of articular cartilage in the medial compartment. 2. No appreciable fracture. Reduced sensitivity for subtle fractures due to the severity of spurring and cortical irregularities. 3. Moderate knee effusion. 4. Given that the patient is unable to bear weight, MRI or CT may be warranted for further characterization. Electronically Signed   By: Van Clines M.D.   On: 02/18/2018 09:41     ASSESSMENT AND PLAN:   73 year old male with history of diverticulosis status post partial colectomy January 2017 who presents with bright red blood per rectum.  1. Bright red blood per rectum:  Patient underwent colonoscopy which shows no obvious source of bleeding. There is patent end to end colo- colonic anastomosis in the sigmoid   2. COPD without signs of exacerbation  3. OSA: Continue CPAP at night  4. Diabetes: Continue sliding scale  5. Essential hypertension: Continue Norvasc  6. Hyperlipidemia: Continue statin 7. Left knee pain after syncopal event while in the hospital: I will order CT scan as per recommendations by radiology. Orthopedic surgery  consultation as well as physical therapy consultation.  Management plans discussed with the patient and he is in agreement.  CODE STATUS: full  TOTAL TIME TAKING CARE OF THIS PATIENT: 28 minutes.     POSSIBLE D/C tomorrow, DEPENDING ON CLINICAL CONDITION.   Calleen Alvis M.D on 02/18/2018 at 11:26 AM  Between 7am to 6pm - Pager - [phone] After 6pm go to [email or web address] - password [email or web address]  Office  [phone]  CC: Primary care physician; Ezequiel Kayser, MD  Note: This dictation was prepared with Dragon dictation along with smaller phrase technology. Any transcriptional errors that result from this process are unintentional.

## 2018-02-18 NOTE — Progress Notes (Signed)
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  He has had embolizations in the past and a resection and this time he had some dizziness and fell.  He is no longer dizzy and had a colonoscopy today that showed some blood in his colon but no active bleeding as a source cannot be identified.  Objective: Vital signs in last 24 hours: Temp:  [96.2 F (35.7 C)-98.2 F (36.8 C)] 98 F (36.7 C) (02/19 1259) Pulse Rate:  [81-91] 81 (02/19 1259) Resp:  [18-20] 19 (02/19 1259) BP: (114-137)/(65-84) 119/73 (02/19 1259) SpO2:  [97 %-100 %] 100 % (02/19 1259) Last BM Date: 02/18/18  Intake/Output from previous day: 02/18 0701 - 02/19 0700 In: 2158.8 [P.O.:1140; I.V.:1018.8] Out: 750 [Urine:350; Stool:400] Intake/Output this shift: Total I/O In: 811.9 [I.V.:811.9] Out: -   Physical exam:  Abdomen soft nontender morbidly obese and distended.  Awake alert and oriented vital signs are stable  Lab Results: CBC  Recent Labs    02/17/18 0350 02/18/18 0430  WBC 11.5* 10.3  HGB 12.4* 10.4*  HCT 37.4* 31.4*  PLT 244 225   BMET Recent Labs    02/16/18 1952 02/17/18 0350  NA 138 138  K 4.2 4.8  CL 102 104  CO2 27 26  GLUCOSE 152* 276*  BUN 14 21*  CREATININE 0.91 1.11  CALCIUM 10.1 9.5   PT/INR No results for input(s): LABPROT, INR in the last 72 hours. ABG No results for input(s): PHART, HCO3 in the last 72 hours.  Invalid input(s): PCO2, PO2  Studies/Results: Dg Knee 1-2 Views Left  Result Date: 02/18/2018 CLINICAL DATA:  Fall due to syncope, weakness in the left leg with difficulty bearing weight EXAM: LEFT KNEE - 1-2 VIEW COMPARISON:  None. FINDINGS: Severe osteoarthritis with full-thickness loss of articular space in the medial compartment and severe marginal spurring in all 3 compartments. Moderate knee effusion is observed on the lateral projection. There is some faint linear calcification  posterior to the femoral condyles on the lateral projection, probably vascular and less likely to be a free fragment in the joint. There is no well-defined fracture on this two view series. IMPRESSION: 1. Severe osteoarthritis including prominent spurring and full-thickness loss of articular cartilage in the medial compartment. 2. No appreciable fracture. Reduced sensitivity for subtle fractures due to the severity of spurring and cortical irregularities. 3. Moderate knee effusion. 4. Given that the patient is unable to bear weight, MRI or CT may be warranted for further characterization. Electronically Signed   By: Van Clines M.D.   On: 02/18/2018 09:41   Ct Knee Left Wo Contrast  Result Date: 02/18/2018 CLINICAL DATA:  Left knee pain after recent fall. EXAM: CT OF THE LEFT KNEE WITHOUT CONTRAST TECHNIQUE: Multidetector CT imaging of the left knee was performed according to the standard protocol. Multiplanar CT image reconstructions were also generated. COMPARISON:  Left knee x-rays from same day. FINDINGS: Bones/Joint/Cartilage No acute fracture or dislocation. Moderate joint effusion. No evidence of lipohemarthrosis. Severe medial and mild lateral and patellofemoral compartment joint space narrowing with large marginal osteophytes. Ligaments Suboptimally assessed by CT. Muscles and Tendons Mild diffuse fatty muscle infiltration. The visualized tendons are unremarkable. The extensor mechanism is intact. Soft tissues Unremarkable. IMPRESSION: 1. No acute fracture or dislocation. Moderate joint effusion without evidence of lipohemarthrosis. 2. Tricompartmental osteoarthritis, severe in the medial compartment. Electronically Signed   By: Titus Dubin M.D.   On: 02/18/2018 13:02  Anti-infectives: Anti-infectives (From admission, onward)   None      Assessment/Plan:  History of ischemic colitis.  He has had a recent colonoscopy today that showed some blood in the colon but no active bleeding  and no site could be identified.  We will continue to monitor.  Florene Glen, MD, FACS  02/18/2018

## 2018-02-18 NOTE — Transfer of Care (Signed)
Immediate Anesthesia Transfer of Care Note  Patient: Evan [name]  Procedure(s) Performed: COLONOSCOPY WITH PROPOFOL (N/A )  Patient Location: PACU and Endoscopy Unit  Anesthesia Type:General  Level of Consciousness: awake, alert , oriented and patient cooperative  Airway & Oxygen Therapy: Patient Spontanous Breathing and Patient connected to nasal cannula oxygen  Post-op Assessment: Report given to RN and Post -op Vital signs reviewed and stable  Post vital signs: Reviewed and stable  Last Vitals:  Vitals:   02/18/18 0651 02/18/18 0755  BP: 123/84 (P) 114/65  Pulse: 88   Resp: 18   Temp: (!) 35.7 C (!) 36.2 C  SpO2: 100%     Last Pain:  Vitals:   02/18/18 0755  TempSrc: Tympanic         Complications: No apparent anesthesia complications

## 2018-02-19 LAB — TYPE AND SCREEN
ABO/RH(D): B POS
ANTIBODY SCREEN: NEGATIVE
UNIT DIVISION: 0
Unit division: 0

## 2018-02-19 LAB — BPAM RBC
BLOOD PRODUCT EXPIRATION DATE: 201903122359
Blood Product Expiration Date: 201903142359
Unit Type and Rh: 7300
Unit Type and Rh: 7300

## 2018-02-19 LAB — CBC
HCT: 31.7 % — ABNORMAL LOW (ref 40.0–52.0)
Hemoglobin: 10.3 g/dL — ABNORMAL LOW (ref 13.0–18.0)
MCH: 28 pg (ref 26.0–34.0)
MCHC: 32.6 g/dL (ref 32.0–36.0)
MCV: 85.7 fL (ref 80.0–100.0)
PLATELETS: 241 10*3/uL (ref 150–440)
RBC: 3.7 MIL/uL — ABNORMAL LOW (ref 4.40–5.90)
RDW: 14.1 % (ref 11.5–14.5)
WBC: 11.2 10*3/uL — ABNORMAL HIGH (ref 3.8–10.6)

## 2018-02-19 LAB — PREPARE RBC (CROSSMATCH)

## 2018-02-19 LAB — GLUCOSE, CAPILLARY
GLUCOSE-CAPILLARY: 134 mg/dL — AB (ref 65–99)
Glucose-Capillary: 146 mg/dL — ABNORMAL HIGH (ref 65–99)

## 2018-02-19 NOTE — Progress Notes (Signed)
Patient discharge teaching given, including activity, diet, follow-up appoints, and medications. Patient verbalized understanding of all discharge instructions. IV access was d/c'd. Vitals are stable. Skin is intact except as charted in most recent assessments. Pt to be escorted out by volunteer, to be driven home by family.  Rhylee Nunn  

## 2018-02-19 NOTE — Discharge Summary (Signed)
[facility] NAME: Evan [name]    MR#:  [id number]  DATE OF BIRTH:  01-Apr-1945  DATE OF ADMISSION:  02/16/2018 ADMITTING PHYSICIAN: Gorden Harms, MD  DATE OF DISCHARGE: 02/19/2018  PRIMARY CARE PHYSICIAN: Ezequiel Kayser, MD    ADMISSION DIAGNOSIS:  rectal bleeding  DISCHARGE DIAGNOSIS:  Active Problems:   Hematochezia   SECONDARY DIAGNOSIS:   Past Medical History:  Diagnosis Date  . Adenoma of colon   . Arthritis    xDJD  . Atrial fibrillation ([place])   . BPH (benign prostatic hyperplasia)   . CHF (congestive heart failure) ([place])   . Diabetes mellitus without complication ([place])   . Discitis   . Diverticulitis   . Diverticulosis   . DVT (deep venous thrombosis) ([place])   . Erectile dysfunction   . Hearing loss   . Hyperlipidemia   . Hypertension   . Ischemic optic neuropathy   . Lower GI bleeding   . Non-ischemic cardiomyopathy ([place])   . Sleep apnea     HOSPITAL COURSE:    73 year old male with history of diverticulosis status post partial colectomy January 2017 who presents with bright red blood per rectum.  1. Bright red blood per rectum/hematochezia with history of ischemic colitis thought to be due to diverticulitis:  Patient underwent colonoscopy which shows no obvious/active source of bleeding. There is patent end to end colo- colonic anastomosis in the sigmoid   2. COPD without signs of exacerbation  3. OSA: Continue CPAP at night  4. Diabetes: Continue outpatient regimen with ADA diet 5. Essential hypertension: Continue Norvasc  6. Hyperlipidemia: Continue statin 7. Left knee pain after syncopal event while in the hospital:  CT scan did not show evidence of new fracture. He was evaluated by Dr. Posey Pronto from orthopedic surgery. CT scan is suggestive of severe degenerative changes especially in the medial compartment. Patient had corticosteroid injection. He will follow-up with Dr. Posey Pronto in 5  weeks   DISCHARGE CONDITIONS AND DIET:   Stable for discharge on diabetic diet  CONSULTS OBTAINED:  Treatment Team:  [name], MD Florene Glen, MD Leim Fabry, MD  DRUG ALLERGIES:   Allergies  Allergen Reactions  . Azithromycin Rash  . Penicillins Rash    Has patient had a PCN reaction causing immediate rash, facial/tongue/throat swelling, SOB or lightheadedness with hypotension: Yes Has patient had a PCN reaction causing severe rash involving mucus membranes or skin necrosis: Yes Has patient had a PCN reaction that required hospitalization No Has patient had a PCN reaction occurring within the last 10 years: No If all of the above answers are "NO", then may proceed with Cephalosporin use. Has patient had a PCN reaction causing immediate rash, facial/tongue/throat swelling, SOB or lightheadedness with hypotension: Yes Has patient had a PCN reaction causing severe rash involving mucus membranes or skin necrosis: Yes Has patient had a PCN reaction that required hospitalization No Has patient had a PCN reaction occurring within the last 10 years: No If all of the above answers are "NO", then may proceed with Cephalosporin use.   [name] Lisinopril Cough  . Losartan Cough  . Metoprolol Other (See Comments)    Bradycardia.    DISCHARGE MEDICATIONS:   Allergies as of 02/19/2018      Reactions   Azithromycin Rash   Penicillins Rash   Has patient had a PCN reaction causing immediate rash, facial/tongue/throat swelling, SOB or lightheadedness with hypotension: Yes Has patient had a  PCN reaction causing severe rash involving mucus membranes or skin necrosis: Yes Has patient had a PCN reaction that required hospitalization No Has patient had a PCN reaction occurring within the last 10 years: No If all of the above answers are "NO", then may proceed with Cephalosporin use. Has patient had a PCN reaction causing immediate rash, facial/tongue/throat swelling, SOB or  lightheadedness with hypotension: Yes Has patient had a PCN reaction causing severe rash involving mucus membranes or skin necrosis: Yes Has patient had a PCN reaction that required hospitalization No Has patient had a PCN reaction occurring within the last 10 years: No If all of the above answers are "NO", then may proceed with Cephalosporin use.   Lisinopril Cough   Losartan Cough   Metoprolol Other (See Comments)   Bradycardia.      Medication List    TAKE these medications   acetaminophen 500 MG tablet Commonly known as:  TYLENOL Take 1,000 mg by mouth every 6 (six) hours as needed.   amLODipine 5 MG tablet Commonly known as:  NORVASC Take 5 mg by mouth daily.   aspirin 81 MG EC tablet Take 1 tablet (81 mg total) by mouth daily.   CENTRUM SILVER PO Take by mouth every morning.   docusate calcium 240 MG capsule Commonly known as:  SURFAK Take 1 capsule by mouth daily.   dorzolamide-timolol 22.3-6.8 MG/ML ophthalmic solution Commonly known as:  COSOPT Apply 1 drop to eye 2 (two) times daily.   ferrous gluconate 324 MG tablet Commonly known as:  FERGON Take 1 tablet (324 mg total) by mouth 2 (two) times daily with a meal.   Fish Oil 1200 MG Caps Take 2 capsules by mouth daily.   latanoprost 0.005 % ophthalmic solution Commonly known as:  XALATAN Apply 1 drop to eye at bedtime.   metFORMIN 500 MG 24 hr tablet Commonly known as:  GLUCOPHAGE-XR 1,000 mg daily with breakfast.   pravastatin 40 MG tablet Commonly known as:  PRAVACHOL Take 40 mg by mouth daily.   SHINGRIX injection Generic drug:  Zoster Vaccine Adjuvanted   Vitamin D3 2000 units capsule Take 2,000 Units by mouth daily.         Today   CHIEF COMPLAINT:   No acute events overnight.   VITAL SIGNS:  Blood pressure 128/62, pulse 87, temperature 97.7 F (36.5 C), temperature source Oral, resp. rate 18, height 6\' 4"  (1.93 m), weight 131.8 kg (290 lb 9.6 oz), SpO2 98 %.   REVIEW OF  SYSTEMS:  Review of Systems  Constitutional: Negative.  Negative for chills, fever and malaise/fatigue.  HENT: Negative.  Negative for ear discharge, ear pain, hearing loss, nosebleeds and sore throat.   Eyes: Negative.  Negative for blurred vision and pain.  Respiratory: Negative.  Negative for cough, hemoptysis, shortness of breath and wheezing.   Cardiovascular: Negative.  Negative for chest pain, palpitations and leg swelling.  Gastrointestinal: Negative.  Negative for abdominal pain, blood in stool, diarrhea, nausea and vomiting.  Genitourinary: Negative.  Negative for dysuria.  Musculoskeletal: Positive for joint pain (better knee pain). Negative for back pain.  Skin: Negative.   Neurological: Negative for dizziness, tremors, speech change, focal weakness, seizures and headaches.  Endo/Heme/Allergies: Negative.  Does not bruise/bleed easily.  Psychiatric/Behavioral: Negative.  Negative for depression, hallucinations and suicidal ideas.     PHYSICAL EXAMINATION:  GENERAL:  73 y.o.-year-old patient lying in the bed with no acute distress.  NECK:  Supple, no jugular venous distention. No thyroid  enlargement, no tenderness.  LUNGS: Normal breath sounds bilaterally, no wheezing, rales,rhonchi  No use of accessory muscles of respiration.  CARDIOVASCULAR: S1, S2 normal. No murmurs, rubs, or gallops.  ABDOMEN: Soft, non-tender, non-distended. Bowel sounds present. No organomegaly or mass.  EXTREMITIES: No pedal edema, cyanosis, or clubbing.  PSYCHIATRIC: The patient is alert and oriented x 3.  SKIN: No obvious rash, lesion, or ulcer.   DATA REVIEW:   CBC Recent Labs  Lab 02/19/18 0404  WBC 11.2*  HGB 10.3*  HCT 31.7*  PLT 241    Chemistries  Recent Labs  Lab 02/16/18 1952 02/17/18 0350  NA 138 138  K 4.2 4.8  CL 102 104  CO2 27 26  GLUCOSE 152* 276*  BUN 14 21*  CREATININE 0.91 1.11  CALCIUM 10.1 9.5  AST 17  --   ALT 18  --   ALKPHOS 78  --   BILITOT 0.8  --      Cardiac Enzymes Recent Labs  Lab 02/16/18 1952  TROPONINI <0.03    Microbiology Results  @[email or web address] @  RADIOLOGY:  Dg Knee 1-2 Views Left  Result Date: 02/18/2018 CLINICAL DATA:  Fall due to syncope, weakness in the left leg with difficulty bearing weight EXAM: LEFT KNEE - 1-2 VIEW COMPARISON:  None. FINDINGS: Severe osteoarthritis with full-thickness loss of articular space in the medial compartment and severe marginal spurring in all 3 compartments. Moderate knee effusion is observed on the lateral projection. There is some faint linear calcification posterior to the femoral condyles on the lateral projection, probably vascular and less likely to be a free fragment in the joint. There is no well-defined fracture on this two view series. IMPRESSION: 1. Severe osteoarthritis including prominent spurring and full-thickness loss of articular cartilage in the medial compartment. 2. No appreciable fracture. Reduced sensitivity for subtle fractures due to the severity of spurring and cortical irregularities. 3. Moderate knee effusion. 4. Given that the patient is unable to bear weight, MRI or CT may be warranted for further characterization. Electronically Signed   By: Van Clines M.D.   On: 02/18/2018 09:41   Ct Knee Left Wo Contrast  Result Date: 02/18/2018 CLINICAL DATA:  Left knee pain after recent fall. EXAM: CT OF THE LEFT KNEE WITHOUT CONTRAST TECHNIQUE: Multidetector CT imaging of the left knee was performed according to the standard protocol. Multiplanar CT image reconstructions were also generated. COMPARISON:  Left knee x-rays from same day. FINDINGS: Bones/Joint/Cartilage No acute fracture or dislocation. Moderate joint effusion. No evidence of lipohemarthrosis. Severe medial and mild lateral and patellofemoral compartment joint space narrowing with large marginal osteophytes. Ligaments Suboptimally assessed by CT. Muscles and Tendons Mild diffuse fatty muscle infiltration. The  visualized tendons are unremarkable. The extensor mechanism is intact. Soft tissues Unremarkable. IMPRESSION: 1. No acute fracture or dislocation. Moderate joint effusion without evidence of lipohemarthrosis. 2. Tricompartmental osteoarthritis, severe in the medial compartment. Electronically Signed   By: Titus Dubin M.D.   On: 02/18/2018 13:02      Allergies as of 02/19/2018      Reactions   Azithromycin Rash   Penicillins Rash   Has patient had a PCN reaction causing immediate rash, facial/tongue/throat swelling, SOB or lightheadedness with hypotension: Yes Has patient had a PCN reaction causing severe rash involving mucus membranes or skin necrosis: Yes Has patient had a PCN reaction that required hospitalization No Has patient had a PCN reaction occurring within the last 10 years: No If all of the above answers are "NO",  then may proceed with Cephalosporin use. Has patient had a PCN reaction causing immediate rash, facial/tongue/throat swelling, SOB or lightheadedness with hypotension: Yes Has patient had a PCN reaction causing severe rash involving mucus membranes or skin necrosis: Yes Has patient had a PCN reaction that required hospitalization No Has patient had a PCN reaction occurring within the last 10 years: No If all of the above answers are "NO", then may proceed with Cephalosporin use.   Lisinopril Cough   Losartan Cough   Metoprolol Other (See Comments)   Bradycardia.      Medication List    TAKE these medications   acetaminophen 500 MG tablet Commonly known as:  TYLENOL Take 1,000 mg by mouth every 6 (six) hours as needed.   amLODipine 5 MG tablet Commonly known as:  NORVASC Take 5 mg by mouth daily.   aspirin 81 MG EC tablet Take 1 tablet (81 mg total) by mouth daily.   CENTRUM SILVER PO Take by mouth every morning.   docusate calcium 240 MG capsule Commonly known as:  SURFAK Take 1 capsule by mouth daily.   dorzolamide-timolol 22.3-6.8 MG/ML  ophthalmic solution Commonly known as:  COSOPT Apply 1 drop to eye 2 (two) times daily.   ferrous gluconate 324 MG tablet Commonly known as:  FERGON Take 1 tablet (324 mg total) by mouth 2 (two) times daily with a meal.   Fish Oil 1200 MG Caps Take 2 capsules by mouth daily.   latanoprost 0.005 % ophthalmic solution Commonly known as:  XALATAN Apply 1 drop to eye at bedtime.   metFORMIN 500 MG 24 hr tablet Commonly known as:  GLUCOPHAGE-XR 1,000 mg daily with breakfast.   pravastatin 40 MG tablet Commonly known as:  PRAVACHOL Take 40 mg by mouth daily.   SHINGRIX injection Generic drug:  Zoster Vaccine Adjuvanted   Vitamin D3 2000 units capsule Take 2,000 Units by mouth daily.           Management plans discussed with the patient and he is in agreement. Stable for discharge   Patient should follow up with pcp  CODE STATUS:     Code Status Orders  (From admission, onward)        Start     Ordered   02/16/18 2256  Full code  Continuous     02/16/18 2255    Code Status History    Date Active Date Inactive Code Status Order ID Comments User Context   01/08/2017 03:14 01/15/2017 21:16 Full Code [id number]  Florene Glen, MD Inpatient   12/27/2016 11:54 01/06/2017 17:26 Full Code [id number]  Hillary Bow, MD ED      TOTAL TIME TAKING CARE OF THIS PATIENT: 38 minutes.    Note: This dictation was prepared with Dragon dictation along with smaller phrase technology. Any transcriptional errors that result from this process are unintentional.  Khalea Ventura M.D on 02/19/2018 at 10:17 AM  Between 7am to 6pm - Pager - [phone] After 6pm go to [email or web address] - password [email or web address] [facility]  Office  [phone]  CC: Primary care physician; Ezequiel Kayser, MD

## 2018-02-19 NOTE — Care Management (Signed)
Patient to discharge today.  Per Raquel Sarna from PT patient was evaluated and is not appropriate for home health PT.  PT discussed with patient about following up with PCP should outpatient PT be indicated.  RNCM signing off

## 2018-02-19 NOTE — Evaluation (Signed)
Physical Therapy Evaluation Patient Details Name: Evan [name] MRN: [id number] DOB: 19-Apr-1945 Today's Date: 02/19/2018   History of Present Illness  Pt is a 73 y.o. male presenting to hospital 2/17 with rectal bleeding and admitted with acute recurrent hematochezia and chronic COPD.  Pt s/p fall 02/17/18 (passed out in bathroom) with L knee pain (x-ray and CT of knee negative for fx/dislocation but showing severe OA and prominent spurring and full thickness loss of articular cartilage in medial compartment; s/p L knee aspiration and injection 02/19/18).  S/p colonoscopy 02/18/18.  PMH includes a-fib, CHF, DM, DVT, ED, htn, R TKR, s/p partial colectomy Jan 2017.  Clinical Impression  Prior to hospital admission, pt was independent.  Pt lives with his wife in 1 level home with 1 step to enter (no railings).  Currently pt is modified independent with bed mobility; modified independent with transfers; CGA with ambulation 240 feet with RW (increased L LE antalgic gait noted with distance ambulated but pt steady without loss of balance); and CGA navigating 1 step with RW.  Pt c/o increased "stiffness" in L knee and general feeling of "weakness" in L knee during session; no L knee buckling noted; pt reporting feeling more stable with use of RW currently.  L knee strength WFL and no c/o L knee pain with activities (except mild "stiffness" with longer ambulation distance).  Pt would benefit from skilled PT to address noted impairments and functional limitations (see below for any additional details) during hospital stay.  Pt and pt's wife educated on safe gradual gentle progression of activity to help prevent L knee pain/issues: both pt and pt's wife verbalizing good understanding.  Upon hospital discharge, anticipate no further PT needs (pt educated to follow up with PCP for any indicated OP PT needs if pt's L knee symptoms did not resolve).    Follow Up Recommendations No PT follow up(Follow up with PCP for any  further OP PT needs)    Equipment Recommendations  Rolling walker with 5" wheels(pt has RW at home already)    Recommendations for Other Services       Precautions / Restrictions Precautions Precautions: Fall Restrictions Weight Bearing Restrictions: No      Mobility  Bed Mobility Overal bed mobility: Modified Independent             General bed mobility comments: Supine to/from sit with HOB elevated without any noted difficulties.  Transfers Overall transfer level: Modified independent Equipment used: Rolling walker (2 wheeled);None             General transfer comment: steady strong transfers with and without AD  Ambulation/Gait Ambulation/Gait assistance: Min guard Ambulation Distance (Feet): 240 Feet Assistive device: Rolling walker (2 wheeled)   Gait velocity: mildly decreased   General Gait Details: pt able to march in place no UE support but pt c/o weak feeling in L knee (pt reported feeling more comfortable using RW for support); pt able to walk with RW with mild decreased stance time L LE initially but more noticeable antalgic gait noted with distance (pt reporting no pain but increased sense of "stiffness" in L knee)  Stairs Stairs: Yes Stairs assistance: Min guard Stair Management: With walker;Forwards Number of Stairs: 1 General stair comments: able to ascend/descend 1 step with RW safely (initial vc's for technique and then pt able to perform safely on own)  Wheelchair Mobility    Modified Rankin (Stroke Patients Only)       Balance Overall balance assessment: Needs assistance Sitting-balance  support: No upper extremity supported;Feet supported Sitting balance-Leahy Scale: Normal Sitting balance - Comments: steady sitting reaching outside BOS   Standing balance support: No upper extremity supported Standing balance-Leahy Scale: Good Standing balance comment: steady standing reaching within BOS and also pulling up underwear in  standing                             Pertinent Vitals/Pain Pain Assessment: 0-10 Pain Score: 2  Pain Location: L knee Pain Descriptors / Indicators: Other (Comment)(Stiffness) Pain Intervention(s): Limited activity within patient's tolerance;Monitored during session;Repositioned  Vitals (HR and O2 on room air) stable and WFL throughout treatment session.    Home Living Family/patient expects to be discharged to:: Private residence Living Arrangements: Spouse/significant other Available Help at Discharge: Family Type of Home: House Home Access: Stairs to enter Entrance Stairs-Rails: None Entrance Stairs-Number of Steps: 1 Home Layout: One level Home Equipment: Environmental consultant - 2 wheels;Cane - single point      Prior Function Level of Independence: Independent         Comments: Goes to gym to work out.  Reports no mechanical falls in past 6 months.     Hand Dominance        Extremity/Trunk Assessment   Upper Extremity Assessment Upper Extremity Assessment: Overall WFL for tasks assessed    Lower Extremity Assessment Lower Extremity Assessment: RLE deficits/detail;LLE deficits/detail RLE Deficits / Details: strength and ROM WFL LLE Deficits / Details: hip flexion 4+/5; knee flexion/extension 4+/5; DF 4+/5    Cervical / Trunk Assessment Cervical / Trunk Assessment: Normal  Communication   Communication: No difficulties  Cognition Arousal/Alertness: Awake/alert Behavior During Therapy: WFL for tasks assessed/performed Overall Cognitive Status: Within Functional Limits for tasks assessed                                        General Comments General comments (skin integrity, edema, etc.): Pt resting in bed upon PT arrival.  Nursing cleared pt for participation in physical therapy.  Pt agreeable to PT session.    Exercises     Assessment/Plan    PT Assessment Patient needs continued PT services  PT Problem List Decreased  strength;Decreased mobility;Pain       PT Treatment Interventions DME instruction;Gait training;Stair training;Functional mobility training;Therapeutic activities;Therapeutic exercise;Balance training;Patient/family education    PT Goals (Current goals can be found in the Care Plan section)  Acute Rehab PT Goals Patient Stated Goal: to go home PT Goal Formulation: With patient Time For Goal Achievement: 03/05/18 Potential to Achieve Goals: Good    Frequency Min 2X/week   Barriers to discharge        Co-evaluation               AM-PAC PT "6 Clicks" Daily Activity  Outcome Measure Difficulty turning over in bed (including adjusting bedclothes, sheets and blankets)?: None Difficulty moving from lying on back to sitting on the side of the bed? : None Difficulty sitting down on and standing up from a chair with arms (e.g., wheelchair, bedside commode, etc,.)?: None Help needed moving to and from a bed to chair (including a wheelchair)?: None Help needed walking in hospital room?: A Little Help needed climbing 3-5 steps with a railing? : A Little 6 Click Score: 22    End of Session Equipment Utilized During Treatment: Gait belt Activity Tolerance: Patient  tolerated treatment well Patient left: in bed;with call bell/phone within reach;with bed alarm set;with family/visitor present Nurse Communication: Mobility status;Precautions PT Visit Diagnosis: Other abnormalities of gait and mobility (R26.89);Muscle weakness (generalized) (M62.81);Pain Pain - Right/Left: Left Pain - part of body: Knee    Time: 8295-6213 PT Time Calculation (min) (ACUTE ONLY): 36 min   Charges:   PT Evaluation $PT Eval Low Complexity: 1 Low PT Treatments $Gait Training: 8-22 mins   PT G CodesLeitha Bleak, PT 02/19/18, 1:43 PM [phone]

## 2018-02-22 ENCOUNTER — Encounter: Payer: Self-pay | Admitting: Emergency Medicine

## 2018-02-22 ENCOUNTER — Observation Stay: Payer: Medicare HMO

## 2018-02-22 ENCOUNTER — Other Ambulatory Visit: Payer: Self-pay

## 2018-02-22 ENCOUNTER — Inpatient Hospital Stay
Admission: EM | Admit: 2018-02-22 | Discharge: 2018-02-25 | DRG: 378 | Disposition: A | Discharge status: Home / Self Care | Payer: Medicare HMO | Attending: Internal Medicine | Admitting: Internal Medicine

## 2018-02-22 DIAGNOSIS — D6489 Other specified anemias: Secondary | ICD-10-CM | POA: Diagnosis present

## 2018-02-22 DIAGNOSIS — I110 Hypertensive heart disease with heart failure: Secondary | ICD-10-CM | POA: Diagnosis present

## 2018-02-22 DIAGNOSIS — G4733 Obstructive sleep apnea (adult) (pediatric): Secondary | ICD-10-CM | POA: Diagnosis present

## 2018-02-22 DIAGNOSIS — K921 Melena: Secondary | ICD-10-CM

## 2018-02-22 DIAGNOSIS — K5730 Diverticulosis of large intestine without perforation or abscess without bleeding: Secondary | ICD-10-CM

## 2018-02-22 DIAGNOSIS — I739 Peripheral vascular disease, unspecified: Secondary | ICD-10-CM | POA: Diagnosis present

## 2018-02-22 DIAGNOSIS — Z79899 Other long term (current) drug therapy: Secondary | ICD-10-CM

## 2018-02-22 DIAGNOSIS — Z881 Allergy status to other antibiotic agents status: Secondary | ICD-10-CM

## 2018-02-22 DIAGNOSIS — K5731 Diverticulosis of large intestine without perforation or abscess with bleeding: Principal | ICD-10-CM | POA: Diagnosis present

## 2018-02-22 DIAGNOSIS — R55 Syncope and collapse: Secondary | ICD-10-CM | POA: Diagnosis present

## 2018-02-22 DIAGNOSIS — I493 Ventricular premature depolarization: Secondary | ICD-10-CM | POA: Diagnosis present

## 2018-02-22 DIAGNOSIS — I4891 Unspecified atrial fibrillation: Secondary | ICD-10-CM | POA: Diagnosis present

## 2018-02-22 DIAGNOSIS — Z86718 Personal history of other venous thrombosis and embolism: Secondary | ICD-10-CM

## 2018-02-22 DIAGNOSIS — N400 Enlarged prostate without lower urinary tract symptoms: Secondary | ICD-10-CM | POA: Diagnosis present

## 2018-02-22 DIAGNOSIS — Z7982 Long term (current) use of aspirin: Secondary | ICD-10-CM

## 2018-02-22 DIAGNOSIS — Z87891 Personal history of nicotine dependence: Secondary | ICD-10-CM

## 2018-02-22 DIAGNOSIS — I509 Heart failure, unspecified: Secondary | ICD-10-CM | POA: Diagnosis present

## 2018-02-22 DIAGNOSIS — J449 Chronic obstructive pulmonary disease, unspecified: Secondary | ICD-10-CM | POA: Diagnosis present

## 2018-02-22 DIAGNOSIS — K922 Gastrointestinal hemorrhage, unspecified: Secondary | ICD-10-CM | POA: Diagnosis present

## 2018-02-22 DIAGNOSIS — D62 Acute posthemorrhagic anemia: Secondary | ICD-10-CM | POA: Diagnosis present

## 2018-02-22 DIAGNOSIS — Z9049 Acquired absence of other specified parts of digestive tract: Secondary | ICD-10-CM

## 2018-02-22 DIAGNOSIS — E7800 Pure hypercholesterolemia, unspecified: Secondary | ICD-10-CM | POA: Diagnosis present

## 2018-02-22 DIAGNOSIS — E785 Hyperlipidemia, unspecified: Secondary | ICD-10-CM | POA: Diagnosis present

## 2018-02-22 DIAGNOSIS — Z833 Family history of diabetes mellitus: Secondary | ICD-10-CM

## 2018-02-22 DIAGNOSIS — Z96651 Presence of right artificial knee joint: Secondary | ICD-10-CM | POA: Diagnosis present

## 2018-02-22 DIAGNOSIS — Z880 Allergy status to penicillin: Secondary | ICD-10-CM

## 2018-02-22 DIAGNOSIS — M1990 Unspecified osteoarthritis, unspecified site: Secondary | ICD-10-CM | POA: Diagnosis present

## 2018-02-22 DIAGNOSIS — H9190 Unspecified hearing loss, unspecified ear: Secondary | ICD-10-CM | POA: Diagnosis present

## 2018-02-22 DIAGNOSIS — E1165 Type 2 diabetes mellitus with hyperglycemia: Secondary | ICD-10-CM | POA: Diagnosis present

## 2018-02-22 DIAGNOSIS — Z6835 Body mass index (BMI) 35.0-35.9, adult: Secondary | ICD-10-CM

## 2018-02-22 DIAGNOSIS — H47019 Ischemic optic neuropathy, unspecified eye: Secondary | ICD-10-CM | POA: Diagnosis present

## 2018-02-22 DIAGNOSIS — Z7984 Long term (current) use of oral hypoglycemic drugs: Secondary | ICD-10-CM

## 2018-02-22 DIAGNOSIS — I428 Other cardiomyopathies: Secondary | ICD-10-CM | POA: Diagnosis present

## 2018-02-22 DIAGNOSIS — E669 Obesity, unspecified: Secondary | ICD-10-CM | POA: Diagnosis present

## 2018-02-22 DIAGNOSIS — K625 Hemorrhage of anus and rectum: Secondary | ICD-10-CM

## 2018-02-22 DIAGNOSIS — Z888 Allergy status to other drugs, medicaments and biological substances status: Secondary | ICD-10-CM

## 2018-02-22 LAB — GLUCOSE, CAPILLARY
Glucose-Capillary: 142 mg/dL — ABNORMAL HIGH (ref 65–99)
Glucose-Capillary: 132 mg/dL — ABNORMAL HIGH (ref 65–99)
Glucose-Capillary: 146 mg/dL — ABNORMAL HIGH (ref 65–99)

## 2018-02-22 LAB — CBC WITH DIFFERENTIAL/PLATELET
BASOS ABS: 0 10*3/uL (ref 0–0.1)
Basophils Relative: 0 %
EOS ABS: 0.2 10*3/uL (ref 0–0.7)
EOS PCT: 1 %
HCT: 29.2 % — ABNORMAL LOW (ref 40.0–52.0)
Hemoglobin: 9.5 g/dL — ABNORMAL LOW (ref 13.0–18.0)
LYMPHS ABS: 3.9 10*3/uL — AB (ref 1.0–3.6)
LYMPHS PCT: 28 %
MCH: 28 pg (ref 26.0–34.0)
MCHC: 32.5 g/dL (ref 32.0–36.0)
MCV: 86 fL (ref 80.0–100.0)
Monocytes Absolute: 1.3 10*3/uL — ABNORMAL HIGH (ref 0.2–1.0)
Monocytes Relative: 9 %
Neutro Abs: 8.5 10*3/uL — ABNORMAL HIGH (ref 1.4–6.5)
Neutrophils Relative %: 62 %
PLATELETS: 351 10*3/uL (ref 150–440)
RBC: 3.4 MIL/uL — AB (ref 4.40–5.90)
RDW: 14.7 % — ABNORMAL HIGH (ref 11.5–14.5)
WBC: 13.9 10*3/uL — AB (ref 3.8–10.6)

## 2018-02-22 LAB — LACTIC ACID, PLASMA
Lactic Acid, Venous: 2.6 mmol/L (ref 0.5–1.9)
Lactic Acid, Venous: 2.5 mmol/L (ref 0.5–1.9)
Lactic Acid, Venous: 1.9 mmol/L (ref 0.5–1.9)
Lactic Acid, Venous: 1.9 mmol/L (ref 0.5–1.9)

## 2018-02-22 LAB — COMPREHENSIVE METABOLIC PANEL
ALK PHOS: 64 U/L (ref 38–126)
ALT: 18 U/L (ref 17–63)
AST: 20 U/L (ref 15–41)
Albumin: 3.4 g/dL — ABNORMAL LOW (ref 3.5–5.0)
Anion gap: 9 (ref 5–15)
BUN: 15 mg/dL (ref 6–20)
CALCIUM: 8.8 mg/dL — AB (ref 8.9–10.3)
CHLORIDE: 105 mmol/L (ref 101–111)
CO2: 25 mmol/L (ref 22–32)
CREATININE: 0.94 mg/dL (ref 0.61–1.24)
GFR calc Af Amer: >60 mL/min (ref >60)
GFR calc non Af Amer: >60 mL/min (ref >60)
GLUCOSE: 284 mg/dL — AB (ref 65–99)
Potassium: 3.9 mmol/L (ref 3.5–5.1)
SODIUM: 139 mmol/L (ref 135–145)
Total Bilirubin: 0.8 mg/dL (ref 0.3–1.2)
Total Protein: 5.9 g/dL — ABNORMAL LOW (ref 6.5–8.1)

## 2018-02-22 LAB — HEMOGLOBIN AND HEMATOCRIT, BLOOD
HEMATOCRIT: 24.3 % — AB (ref 40.0–52.0)
HEMATOCRIT: 26.1 % — AB (ref 40.0–52.0)
HEMOGLOBIN: 8.3 g/dL — AB (ref 13.0–18.0)
Hemoglobin: 7.7 g/dL — ABNORMAL LOW (ref 13.0–18.0)

## 2018-02-22 LAB — PROTIME-INR
INR: 1.06
PROTHROMBIN TIME: 13.7 s (ref 11.4–15.2)

## 2018-02-22 LAB — TROPONIN I: Troponin I: <0.03 ng/mL (ref <0.03)

## 2018-02-22 LAB — TSH: TSH: 1.357 u[IU]/mL (ref 0.350–4.500)

## 2018-02-22 MED ORDER — PRAVASTATIN SODIUM 20 MG PO TABS
40.0000 mg | ORAL_TABLET | Freq: Every day | ORAL | Status: DC
  Administered 2018-02-22 – 2018-02-24 (×3): 40 mg via ORAL
  Filled 2018-02-22 (×4): qty 2

## 2018-02-22 MED ORDER — INSULIN ASPART 100 UNIT/ML ~~LOC~~ SOLN: 0.0000 [IU] | Freq: Every day | SUBCUTANEOUS | Status: DC

## 2018-02-22 MED ORDER — DORZOLAMIDE HCL-TIMOLOL MAL 2-0.5 % OP SOLN
1.0000 [drp] | Freq: Two times a day (BID) | OPHTHALMIC | Status: DC
  Administered 2018-02-22 – 2018-02-25 (×6): 1 [drp] via OPHTHALMIC
  Filled 2018-02-22 (×2): qty 10

## 2018-02-22 MED ORDER — ONDANSETRON HCL 4 MG PO TABS: 4.0000 mg | ORAL_TABLET | Freq: Four times a day (QID) | ORAL | Status: DC | PRN

## 2018-02-22 MED ORDER — SODIUM CHLORIDE 0.9 % IV SOLN
8.0000 mg/h | INTRAVENOUS | Status: DC
  Administered 2018-02-22 – 2018-02-24 (×4): 8 mg/h via INTRAVENOUS
  Filled 2018-02-22 (×5): qty 80

## 2018-02-22 MED ORDER — METRONIDAZOLE IN NACL 5-0.79 MG/ML-% IV SOLN
500.0000 mg | Freq: Three times a day (TID) | INTRAVENOUS | Status: DC
  Administered 2018-02-22 – 2018-02-25 (×9): 500 mg via INTRAVENOUS
  Filled 2018-02-22 (×12): qty 100

## 2018-02-22 MED ORDER — SODIUM CHLORIDE 0.9 % IV SOLN
INTRAVENOUS | Status: DC
  Administered 2018-02-22 – 2018-02-25 (×7): via INTRAVENOUS

## 2018-02-22 MED ORDER — AMLODIPINE BESYLATE 5 MG PO TABS: 5.0000 mg | ORAL_TABLET | Freq: Every day | ORAL | Status: DC

## 2018-02-22 MED ORDER — LATANOPROST 0.005 % OP SOLN
1.0000 [drp] | Freq: Every day | OPHTHALMIC | Status: DC
  Administered 2018-02-22 – 2018-02-24 (×3): 1 [drp] via OPHTHALMIC
  Filled 2018-02-22: qty 2.5

## 2018-02-22 MED ORDER — PANTOPRAZOLE SODIUM 40 MG IV SOLR
80.0000 mg | Freq: Once | INTRAVENOUS | Status: AC
  Administered 2018-02-22: 21:00:00 80 mg via INTRAVENOUS
  Filled 2018-02-22: qty 80

## 2018-02-22 MED ORDER — CIPROFLOXACIN IN D5W 400 MG/200ML IV SOLN
400.0000 mg | Freq: Two times a day (BID) | INTRAVENOUS | Status: DC
  Administered 2018-02-22 – 2018-02-25 (×7): 400 mg via INTRAVENOUS
  Filled 2018-02-22 (×9): qty 200

## 2018-02-22 MED ORDER — ACETAMINOPHEN 650 MG RE SUPP: 650.0000 mg | Freq: Four times a day (QID) | RECTAL | Status: DC | PRN

## 2018-02-22 MED ORDER — ONDANSETRON HCL 4 MG/2ML IJ SOLN: 4.0000 mg | Freq: Four times a day (QID) | INTRAMUSCULAR | Status: DC | PRN

## 2018-02-22 MED ORDER — SODIUM CHLORIDE 0.9 % IV BOLUS (SEPSIS)
1000.0000 mL | INTRAVENOUS | Status: AC
  Administered 2018-02-22: 1000 mL via INTRAVENOUS

## 2018-02-22 MED ORDER — VITAMIN D 1000 UNITS PO TABS
2000.0000 [IU] | ORAL_TABLET | Freq: Every day | ORAL | Status: DC
  Administered 2018-02-22 – 2018-02-25 (×4): 2000 [IU] via ORAL
  Filled 2018-02-22 (×5): qty 2

## 2018-02-22 MED ORDER — ACETAMINOPHEN 325 MG PO TABS
650.0000 mg | ORAL_TABLET | Freq: Four times a day (QID) | ORAL | Status: DC | PRN
Start: 2018-02-22 — End: 2018-02-25

## 2018-02-22 MED ORDER — SODIUM CHLORIDE 0.9 % IV SOLN: INTRAVENOUS | Status: DC

## 2018-02-22 MED ORDER — PANTOPRAZOLE SODIUM 40 MG IV SOLR: 40.0000 mg | Freq: Two times a day (BID) | INTRAVENOUS | Status: DC

## 2018-02-22 MED ORDER — SODIUM CHLORIDE 0.9 % IV BOLUS (SEPSIS)
1000.0000 mL | Freq: Once | INTRAVENOUS | Status: AC
  Administered 2018-02-22: 1000 mL via INTRAVENOUS

## 2018-02-22 MED ORDER — INSULIN ASPART 100 UNIT/ML ~~LOC~~ SOLN
0.0000 [IU] | Freq: Four times a day (QID) | SUBCUTANEOUS | Status: DC
  Administered 2018-02-22 – 2018-02-23 (×3): 1 [IU] via SUBCUTANEOUS
  Administered 2018-02-23: 2 [IU] via SUBCUTANEOUS
  Administered 2018-02-23 – 2018-02-25 (×3): 1 [IU] via SUBCUTANEOUS
  Administered 2018-02-25: 2 [IU] via SUBCUTANEOUS
  Administered 2018-02-25: 1 [IU] via SUBCUTANEOUS
  Filled 2018-02-22 (×9): qty 1

## 2018-02-22 MED ORDER — FERROUS GLUCONATE 324 (38 FE) MG PO TABS
324.0000 mg | ORAL_TABLET | Freq: Two times a day (BID) | ORAL | Status: DC
  Administered 2018-02-22 – 2018-02-25 (×6): 324 mg via ORAL
  Filled 2018-02-22 (×9): qty 1

## 2018-02-22 MED ORDER — PANTOPRAZOLE SODIUM 40 MG IV SOLR
40.0000 mg | Freq: Two times a day (BID) | INTRAVENOUS | Status: DC
Start: 2018-02-22 — End: 2018-02-22

## 2018-02-22 MED ORDER — TECHNETIUM TC 99M-LABELED RED BLOOD CELLS IV KIT
22.8200 | PACK | Freq: Once | INTRAVENOUS | Status: AC | PRN
  Administered 2018-02-22: 22.82 via INTRAVENOUS

## 2018-02-22 MED ORDER — DOCUSATE SODIUM 100 MG PO CAPS
100.0000 mg | ORAL_CAPSULE | Freq: Two times a day (BID) | ORAL | Status: DC
  Administered 2018-02-22 – 2018-02-25 (×5): 100 mg via ORAL
  Filled 2018-02-22 (×6): qty 1

## 2018-02-22 MED ORDER — ASPIRIN EC 81 MG PO TBEC
81.0000 mg | DELAYED_RELEASE_TABLET | Freq: Every day | ORAL | Status: DC
  Filled 2018-02-22: qty 1

## 2018-02-22 MED ORDER — VITAMIN D3 50 MCG (2000 UT) PO CAPS: 2000.0000 [IU] | ORAL_CAPSULE | Freq: Every day | ORAL | Status: DC

## 2018-02-22 NOTE — ED Triage Notes (Addendum)
Pt bib [facility] following 1 episode bloody stools today. Pt was d/c Wednesday following admission for diverticulosis, scopes were negative. Pt denies abdominal pain,N/V, reports "belly feels bubbly", dizziness, and chills. Pt has hx same, abdominal surgeries, transfusions in past. VSS in route with EMS.

## 2018-02-22 NOTE — ED Notes (Signed)
Family at bedside. 

## 2018-02-22 NOTE — Consult Note (Signed)
Evan Antigua, MD [address] [employer], [place], [place], [address] [address], [place], [place], [place], [address] Phone: [phone]  Fax: [phone]  Consultation  Referring Provider:     Dr. Margaretmary Eddy Primary Care Physician:  Ezequiel Kayser, MD Primary Gastroenterologist:  Virgel Manifold, MD        Reason for Consultation:     GI bleed  Date of Admission:  02/22/2018 Date of Consultation:  02/22/2018         HPI:   Evan [name] is a 73 y.o. male presents with lightheadedness and hematochezia at home.  Patient reports 4-5 bowel movements with bright blood per rectum for 1 day. Is having another episode this morning.  States last time he presented to the hospital, it was worse than it is now. Reports no abdominal pain.  No nausea vomiting or hematemesis.  Patient was recently discharged from the hospital on February 19, 2018, and presented for hematochezia at that time as well.  He has had previous history of partial colectomy in January 2018.  Colonoscopy on February 18, 2018 by Dr. Allen Norris showed previous anastomosis site.  Diverticulosis was noted.  No actively late bleeding lesions were reported, however colonoscopy report states more blood was seen in the left than the right colon.  Bleeding scan was recommended if patient continued to have further bleeding.    Patient had diverticular bleed about 3-4 years ago, and required embolization at that time.  He presented in December 2017 with amount of and underwent angiogram with embolization of the sigmoid colon, but had recurrence of bleeding during that admission.  He underwent partial colectomy and exploratory laparotomy on January 01, 2017.  Sigmoid colon distal descending colon was removed.  He returned on January 08, 2018 due to evisceration from exploratory laparotomy site, and was taken for emergency surgery for wound evisceration.  He underwent colonoscopy by Dr. Vira Agar in September 2018, 1 rectal polyp was removed,  and an isolated ulcer was seen in the descending colon.  Biopsies showed polypoid colonic mucosa with fibrosis, inflammation of the lamina propria.  Past Medical History:  Diagnosis Date  . Adenoma of colon   . Arthritis    xDJD  . Atrial fibrillation ([place])   . BPH (benign prostatic hyperplasia)   . CHF (congestive heart failure) ([place])   . Diabetes mellitus without complication ([place])   . Discitis   . Diverticulitis   . Diverticulosis   . DVT (deep venous thrombosis) ([place])   . Erectile dysfunction   . Hearing loss   . Hyperlipidemia   . Hypertension   . Ischemic optic neuropathy   . Lower GI bleeding   . Non-ischemic cardiomyopathy ([place])   . Sleep apnea     Past Surgical History:  Procedure Laterality Date  . ATRIAL ABLATION SURGERY    . CHOLECYSTECTOMY    . COLONOSCOPY WITH PROPOFOL N/A 09/25/2017   Procedure: COLONOSCOPY WITH PROPOFOL;  Surgeon: Manya Silvas, MD;  Location[facility] END[facility]COPY;  Service: Endoscopy;  Laterality: N/A;  . COLONOSCOPY WITH PROPOFOL N/A 02/18/2018   Procedure: COLONOSCOPY WITH PROPOFOL;  Surgeon: [name], MD;  Location: [facility] ENDOSCOPY;  Service: Endoscopy;  Laterality: N/A;  . JOINT REPLACEMENT Right    knee  . LAPAROTOMY  01/01/2017   Procedure: EXPLORATORY LAPAROTOMY;  Surgeon: Olean Ree, MD;  Location: [facility] ORS;  Service: General;;  . PARTIAL COLECTOMY N/A 01/01/2017   Procedure: PARTIAL COLECTOMY;  Surgeon: Olean Ree, MD;  Location: [facility] ORS;  Service: General;  Laterality: N/A;  . PERIPHERAL VASCULAR CATHETERIZATION N/A 12/28/2016   Procedure: Visceral Artery Intervention;  Surgeon: Algernon Huxley, MD;  Location: [facility] LAB;  Service: Cardiovascular;  Laterality: N/A;  . SECONDARY CLOSURE OF WOUND N/A 01/08/2017   Procedure: SECONDARY CLOSURE FOR EVISCERATION;  Surgeon: Florene Glen, MD;  Location: [facility] ORS;  Service: General;  Laterality: N/A;  . SIGMOIDECTOMY    . TOTAL KNEE ARTHROPLASTY Right     Prior to Admission  medications   Medication Sig Start Date End Date Taking? Authorizing Provider  acetaminophen (TYLENOL) 500 MG tablet Take 1,000 mg by mouth every 6 (six) hours as needed.   Yes [provider]  amLODipine (NORVASC) 5 MG tablet Take 5 mg by mouth daily. 05/03/16  Yes [provider]  aspirin EC 81 MG EC tablet Take 1 tablet (81 mg total) by mouth daily. 01/16/17  Yes Loflin, Lavona Mound, MD  Cholecalciferol (VITAMIN D3) 2000 units capsule Take 2,000 Units by mouth daily.   Yes [provider]  docusate calcium (SURFAK) 240 MG capsule Take 1 capsule by mouth daily.   Yes [provider]  dorzolamide-timolol (COSOPT) 22.3-6.8 MG/ML ophthalmic solution Apply 1 drop to eye 2 (two) times daily.   Yes [provider]  ferrous gluconate (FERGON) 324 MG tablet Take 1 tablet (324 mg total) by mouth 2 (two) times daily with a meal. 01/06/17  Yes Theodoro Grist, MD  latanoprost (XALATAN) 0.005 % ophthalmic solution Apply 1 drop to eye at bedtime.   Yes [provider]  metFORMIN (GLUCOPHAGE-XR) 500 MG 24 hr tablet 1,000 mg daily with breakfast.  08/30/17  Yes [provider]  Multiple Vitamins-Minerals (CENTRUM SILVER PO) Take by mouth every morning.   Yes [provider]  Omega-3 Fatty Acids (FISH OIL) 1200 MG CAPS Take 2 capsules by mouth daily.   Yes [provider]  pravastatin (PRAVACHOL) 40 MG tablet Take 40 mg by mouth daily. 05/03/16  Yes [provider]  [facility] injection  08/15/17   [provider]    Family History  Problem Relation Age of Onset  . Diabetes Sister   . Diabetes Brother   . Diabetes Sister   . Diabetes Brother      Social History   Tobacco Use  . Smoking status: Former Smoker    Packs/day: 0.25    Years: 29.00    Pack years: 7.25    Types: Cigarettes    Last attempt to quit: 12/31/1993    Years since quitting: 24.1  . Smokeless tobacco: Never Used  Substance Use Topics  .  Alcohol use: No    Alcohol/week: 0.0 oz  . Drug use: No    Allergies as of 02/22/2018 - Review Complete 02/22/2018  Allergen Reaction Noted  . Azithromycin Rash 07/09/2016  . Penicillins Rash 07/09/2016  . Lisinopril Cough 07/09/2016  . Losartan Cough 07/09/2016  . Metoprolol Other (See Comments) 07/09/2016    Review of Systems:    All systems reviewed and negative except where noted in HPI.   Physical Exam:  Vital signs in last 24 hours: Vitals:   02/22/18 0530 02/22/18 0600 02/22/18 0630 02/22/18 0833  BP: (!) 90/51 94/61 (!) 108/91 111/60  Pulse: 85 95 81 86  Resp: 17 18 13 18   Temp:    98.3 F (36.8 C)  TempSrc:    Oral  SpO2: 99% 99% 99% 98%  Weight:      Height:  Last BM Date: 02/22/18 General:   Pleasant, cooperative in NAD Head:  Normocephalic and atraumatic. Eyes:   No icterus.   Conjunctiva pink. PERRLA. Ears:  Normal auditory acuity. Neck:  Supple; no masses or thyroidomegaly Lungs: Respirations even and unlabored. Lungs clear to auscultation bilaterally.   No wheezes, crackles, or rhonchi.  Heart:  Regular rate and rhythm;  Without murmur, clicks, rubs or gallops Abdomen:  Soft, nondistended, nontender. Normal bowel sounds. No appreciable masses or hepatomegaly.  No rebound or guarding.  Neurologic:  Alert and oriented x3;  grossly normal neurologically. Skin:  Intact without significant lesions or rashes. Cervical Nodes:  No significant cervical adenopathy. Psych:  Alert and cooperative. Normal affect.  LAB RESULTS: Recent Labs    02/22/18 0510  WBC 13.9*  HGB 9.5*  HCT 29.2*  PLT 351   BMET Recent Labs    02/22/18 0510  NA 139  K 3.9  CL 105  CO2 25  GLUCOSE 284*  BUN 15  CREATININE 0.94  CALCIUM 8.8*   LFT Recent Labs    02/22/18 0510  PROT 5.9*  ALBUMIN 3.4*  AST 20  ALT 18  ALKPHOS 64  BILITOT 0.8   PT/INR Recent Labs    02/22/18 0510  LABPROT 13.7  INR 1.06    STUDIES: No results found.    Impression /  Plan:   Petro Talent is a 73 y.o. y/o male with multiple episodes of hematochezia in the past, with most recent colonoscopy in February 18, 2018, identifying diverticulosis, but no actively bleeding lesions, and some blood seen in the colon, and bleeding scan recommended for further episodes of hematochezia  Would recommend bleeding scan at this time due to active bleeding and recent negative colonoscopy Patient is on aspirin daily at home Would recommend PPI twice daily Avoid NSAIDs Continue serial CBCs and transfuse PRN Avoid hypotension Source of bleeding could be diverticular vs small bowel AVMs  Thank you for involving me in the care of this patient.      LOS: 0 days   Virgel Manifold, MD  02/22/2018, 10:43 AM

## 2018-02-22 NOTE — ED Notes (Signed)
Test: lactic acid Critical Value: 2.6  Name of Provider Notified: SUNG

## 2018-02-22 NOTE — Consult Note (Signed)
Reason for Consult: Syncope history of atrial fibrillation Referring Physician: Dr. Rosilyn Mings hospitalist Cardiologist Dr. Jordan Hawks  Evan [name] is an 73 y.o. male.  HPI: Patient's a 73 year old obese black male presented with rectal bleeding and syncope.  Patient has history of atrial fibrillation status post ablation so he is not on long-term anticoagulation.  Patient has hypertension diabetes reportedly had lightheadedness that followed significant rectal bleeding and he had what appeared to be a syncopal episode.  Patient had a similar episode in the past with rectal bleeding denies any palpitations or tachycardia no nausea vomiting patient feels much improved now  Past Medical History:  Diagnosis Date  . Adenoma of colon   . Arthritis    xDJD  . Atrial fibrillation ([place])   . BPH (benign prostatic hyperplasia)   . CHF (congestive heart failure) ([place])   . Diabetes mellitus without complication ([place])   . Discitis   . Diverticulitis   . Diverticulosis   . DVT (deep venous thrombosis) ([place])   . Erectile dysfunction   . Hearing loss   . Hyperlipidemia   . Hypertension   . Ischemic optic neuropathy   . Lower GI bleeding   . Non-ischemic cardiomyopathy ([place])   . Sleep apnea     Past Surgical History:  Procedure Laterality Date  . ATRIAL ABLATION SURGERY    . CHOLECYSTECTOMY    . COLONOSCOPY WITH PROPOFOL N/A 09/25/2017   Procedure: COLONOSCOPY WITH PROPOFOL;  Surgeon: Manya Silvas, MD;  Location: [facility] ENDOSCOPY;  Service: Endoscopy;  Laterality: N/A;  . COLONOSCOPY WITH PROPOFOL N/A 02/18/2018   Procedure: COLONOSCOPY WITH PROPOFOL;  Surgeon: [name], MD;  Location: [facility] ENDOSCOPY;  Service: Endoscopy;  Laterality: N/A;  . JOINT REPLACEMENT Right    knee  . LAPAROTOMY  01/01/2017   Procedure: EXPLORATORY LAPAROTOMY;  Surgeon: Olean Ree, MD;  Location: [facility] ORS;  Service: General;;  . PARTIAL COLECTOMY N/A 01/01/2017   Procedure: PARTIAL COLECTOMY;  Surgeon:  Olean Ree, MD;  Location: [facility] ORS;  Service: General;  Laterality: N/A;  . PERIPHERAL VASCULAR CATHETERIZATION N/A 12/28/2016   Procedure: Visceral Artery Intervention;  Surgeon: Algernon Huxley, MD;  Location: [facility] CV LAB;  Service: Cardiovascular;  Laterality: N/A;  . SECONDARY CLOSURE OF WOUND N/A 01/08/2017   Procedure: SECONDARY CLOSURE FOR EVISCERATION;  Surgeon: Florene Glen, MD;  Location: [facility] ORS;  Service: General;  Laterality: N/A;  . SIGMOIDECTOMY    . TOTAL KNEE ARTHROPLASTY Right     Family History  Problem Relation Age of Onset  . Diabetes Sister   . Diabetes Brother   . Diabetes Sister   . Diabetes Brother     Social History:  reports that he quit smoking about 24 years ago. His smoking use included cigarettes. He has a 7.25 pack-year smoking history. he has never used smokeless tobacco. He reports that he does not drink alcohol or use drugs.  Allergies:  Allergies  Allergen Reactions  . Azithromycin Rash  . Penicillins Rash    Has patient had a PCN reaction causing immediate rash, facial/tongue/throat swelling, SOB or lightheadedness with hypotension: Yes Has patient had a PCN reaction causing severe rash involving mucus membranes or skin necrosis: Yes Has patient had a PCN reaction that required hospitalization No Has patient had a PCN reaction occurring within the last 10 years: No If all of the above answers are "NO", then may proceed with Cephalosporin use. Has patient had a PCN reaction causing immediate rash, facial/tongue/throat swelling,  SOB or lightheadedness with hypotension: Yes Has patient had a PCN reaction causing severe rash involving mucus membranes or skin necrosis: Yes Has patient had a PCN reaction that required hospitalization No Has patient had a PCN reaction occurring within the last 10 years: No If all of the above answers are "NO", then may proceed with Cephalosporin use.   [name] Lisinopril Cough  . Losartan Cough  . Metoprolol  Other (See Comments)    Bradycardia.    Medications: I have reviewed the patient's current medications.  Results for orders placed or performed during the hospital encounter of 02/22/18 (from the past 48 hour(s))  CBC with Differential     Status: Abnormal   Collection Time: 02/22/18  5:10 AM  Result Value Ref Range   WBC 13.9 (H) 3.8 - 10.6 K/uL   RBC 3.40 (L) 4.40 - 5.90 MIL/uL   Hemoglobin 9.5 (L) 13.0 - 18.0 g/dL   HCT 29.2 (L) 40.0 - 52.0 %   MCV 86.0 80.0 - 100.0 fL   MCH 28.0 26.0 - 34.0 pg   MCHC 32.5 32.0 - 36.0 g/dL   RDW 14.7 (H) 11.5 - 14.5 %   Platelets 351 150 - 440 K/uL   Neutrophils Relative % 62 %   Neutro Abs 8.5 (H) 1.4 - 6.5 K/uL   Lymphocytes Relative 28 %   Lymphs Abs 3.9 (H) 1.0 - 3.6 K/uL   Monocytes Relative 9 %   Monocytes Absolute 1.3 (H) 0.2 - 1.0 K/uL   Eosinophils Relative 1 %   Eosinophils Absolute 0.2 0 - 0.7 K/uL   Basophils Relative 0 %   Basophils Absolute 0.0 0 - 0.1 K/uL    Comment: Performed at [facility], [address] [place], [place] [address]  Comprehensive metabolic panel     Status: Abnormal   Collection Time: 02/22/18  5:10 AM  Result Value Ref Range   Sodium 139 135 - 145 mmol/L   Potassium 3.9 3.5 - 5.1 mmol/L   Chloride 105 101 - 111 mmol/L   CO2 25 22 - 32 mmol/L   Glucose, Bld 284 (H) 65 - 99 mg/dL   BUN 15 6 - 20 mg/dL   Creatinine, Ser 0.94 0.61 - 1.24 mg/dL   Calcium 8.8 (L) 8.9 - 10.3 mg/dL   Total Protein 5.9 (L) 6.5 - 8.1 g/dL   Albumin 3.4 (L) 3.5 - 5.0 g/dL   AST 20 15 - 41 U/L   ALT 18 17 - 63 U/L   Alkaline Phosphatase 64 38 - 126 U/L   Total Bilirubin 0.8 0.3 - 1.2 mg/dL   GFR calc non Af Amer >60 >60 mL/min   GFR calc Af Amer >60 >60 mL/min    Comment: (NOTE) The eGFR has been calculated using the CKD EPI equation. This calculation has not been validated in all clinical situations. eGFR's persistently <60 mL/min signify possible Chronic Kidney Disease.    Anion gap 9 5 - 15    Comment:  Performed at [facility], [address] [place], [place] [address]  Troponin I     Status: None   Collection Time: 02/22/18  5:10 AM  Result Value Ref Range   Troponin I <0.03 <0.03 ng/mL    Comment: Performed at [facility], [address] [place], [place] [address]  Lactic acid, plasma     Status: Abnormal   Collection Time: 02/22/18  5:10 AM  Result Value Ref Range   Lactic Acid, Venous 2.6 (  HH) 0.5 - 1.9 mmol/L    Comment: CRITICAL RESULT CALLED TO, READ BACK BY AND VERIFIED WITH ALLISON PATE AT 7711 02/22/18.PMH Performed at [facility], [address] [place], [place] [address]   Protime-INR     Status: None   Collection Time: 02/22/18  5:10 AM  Result Value Ref Range   Prothrombin Time 13.7 11.4 - 15.2 seconds   INR 1.06     Comment: Performed at [facility], [address] [place], [place] [address]  Type and screen [place]     Status: None   Collection Time: 02/22/18  5:10 AM  Result Value Ref Range   ABO/RH(D) B POS    Antibody Screen NEG    Sample Expiration      02/25/2018 Performed at [facility], [address] [place], [place] [address]   Lactic acid, plasma     Status: Abnormal   Collection Time: 02/22/18  8:26 AM  Result Value Ref Range   Lactic Acid, Venous 2.5 (HH) 0.5 - 1.9 mmol/L    Comment: CRITICAL RESULT CALLED TO, READ BACK BY AND VERIFIED WITH JACK DOUGHERTY [phone] ON 02/22/18 BY SNJ Performed at [facility], [address] [place], [place] [address]   TSH     Status: None   Collection Time: 02/22/18  8:26 AM  Result Value Ref Range   TSH 1.357 0.350 - 4.500 uIU/mL    Comment: Performed by a 3rd Generation assay with a functional sensitivity of <=0.01 uIU/mL. Performed at [facility], [address] [place], [place] [address]   Lactic acid, plasma     Status: None   Collection Time: 02/22/18 10:18 AM  Result Value Ref Range   Lactic Acid,  Venous 1.9 0.5 - 1.9 mmol/L    Comment: Performed at [facility], [address] [place], [place] [address]  Glucose, capillary     Status: Abnormal   Collection Time: 02/22/18 11:54 AM  Result Value Ref Range   Glucose-Capillary 142 (H) 65 - 99 mg/dL   Comment 1 Notify RN   Lactic acid, plasma     Status: None   Collection Time: 02/22/18  1:04 PM  Result Value Ref Range   Lactic Acid, Venous 1.9 0.5 - 1.9 mmol/L    Comment: Performed at [facility], [address] [place], [place] [address]  Hemoglobin and hematocrit, blood     Status: Abnormal   Collection Time: 02/22/18  1:40 PM  Result Value Ref Range   Hemoglobin 7.7 (L) 13.0 - 18.0 g/dL   HCT 24.3 (L) 40.0 - 52.0 %    Comment: Performed at [facility], [address] [place], [place] [address]  Glucose, capillary     Status: Abnormal   Collection Time: 02/22/18  5:42 PM  Result Value Ref Range   Glucose-Capillary 132 (H) 65 - 99 mg/dL   Comment 1 Notify RN   Hemoglobin and hematocrit, blood     Status: Abnormal   Collection Time: 02/22/18  6:58 PM  Result Value Ref Range   Hemoglobin 8.3 (L) 13.0 - 18.0 g/dL   HCT 26.1 (L) 40.0 - 52.0 %    Comment: Performed at [facility], [address] [place], [place] [address]  Glucose, capillary     Status: Abnormal   Collection Time: 02/22/18  9:18 PM  Result Value Ref Range   Glucose-Capillary 146 (H) 65 - 99 mg/dL  Nm Gi Blood Loss  Result Date: 02/22/2018 CLINICAL DATA:  Hematochezia. EXAM: NUCLEAR MEDICINE GASTROINTESTINAL BLEEDING SCAN TECHNIQUE: Sequential abdominal images were obtained following intravenous administration of Tc-16mlabeled red blood cells. Imaging was performed for 2 hr. RADIOPHARMACEUTICALS:  22.8 mCi Tc-976mertechnetate in-vitro labeled red cells. COMPARISON:  12/27/2016 FINDINGS: No abnormal radiopharmaceutical activity seen within the small or large bowel. Radiopharmaceutical activity matching blood pool  is seen in the region of the genitalia, which is confirmed on the lateral projection and is unchanged compared to previous study. IMPRESSION: No evidence of active GI bleeding. Electronically Signed   By: JoEarle Gell.D.   On: 02/22/2018 17:00    Review of Systems  Constitutional: Positive for malaise/fatigue.  HENT: Negative.   Eyes: Negative.   Respiratory: Negative.   Cardiovascular: Negative.   Gastrointestinal: Positive for blood in stool.  Genitourinary: Negative.   Musculoskeletal: Negative.   Skin: Negative.   Neurological: Positive for loss of consciousness and weakness.  Psychiatric/Behavioral: Negative.    Blood pressure 129/66, pulse 83, temperature 98.2 F (36.8 C), temperature source Oral, resp. rate (!) 22, height _0  (1.93 m), weight 291 lb (132 kg), SpO2 100 %. Physical Exam  Nursing note and vitals reviewed. Constitutional: He is oriented to person, place, and time. He appears well-developed and well-nourished.  HENT:  Head: Normocephalic and atraumatic.  Eyes: Conjunctivae and EOM are normal. Pupils are equal, round, and reactive to light.  Neck: Normal range of motion. Neck supple.  Cardiovascular: Normal rate, regular rhythm and normal heart sounds.  Respiratory: Effort normal and breath sounds normal.  GI: Soft. Bowel sounds are normal.  Musculoskeletal: Normal range of motion.  Neurological: He is alert and oriented to person, place, and time. He has normal reflexes.  Skin: Skin is warm and dry.  Psychiatric: He has a normal mood and affect.    Assessment/Plan: Syncope GI bleed Atrial fibrillation History of ablation Diabetes type 2 Nonischemic cardiomyopathy Obstructive sleep apnea Obesity Diverticulosis Hyperlipidemia . Plan Agree with admission recommend conservative medical therapy 2 recommend long-term anticoagulation 3 recommend sleep study CPAP weight loss 4 agree with antibiotic therapy 5 continue hypertension control 6 agree with  GI possible colonoscopy 7 continue diabetes management control 8 consider neurology evaluation for no syncope 9 low likelihood of cardiac etiology for syncope 10 have the patient follow-up with cardiology as necessary   Dwayne D [place]/23/2019, 10:37 PM

## 2018-02-22 NOTE — ED Provider Notes (Signed)
[facility] Emergency Department Provider Note   ____________________________________________   None    (approximate)  I have reviewed the triage vital signs and the nursing notes.   HISTORY  Chief Complaint Rectal Bleeding and Diverticulosis    HPI Evan [name] is a 73 y.o. male brought to the ED from home via EMS with a chief complaint of rectal bleeding.  Patient has a history of ischemic colitis, history of diverticulosis status post hemicolectomy secondary to persistent diverticular bleed status post colonoscopy in 08/2017 with polypectomy.  Recent hospitalization for rectal bleeding; unremarkable colonoscopy.  States this morning he had a bloody bowel movement, felt dizzy and nearly passed out.  He was noted to have a syncopal episode while hospitalized recently.  Denies anticoagulants.  Aside from rectal bleeding and dizziness, patient currently denies chest pain, shortness of breath, abdominal pain, nausea, vomiting or diarrhea.   Past Medical History:  Diagnosis Date  . Adenoma of colon   . Arthritis    xDJD  . Atrial fibrillation ([place])   . BPH (benign prostatic hyperplasia)   . CHF (congestive heart failure) ([place])   . Diabetes mellitus without complication ([place])   . Discitis   . Diverticulitis   . Diverticulosis   . DVT (deep venous thrombosis) ([place])   . Erectile dysfunction   . Hearing loss   . Hyperlipidemia   . Hypertension   . Ischemic optic neuropathy   . Lower GI bleeding   . Non-ischemic cardiomyopathy ([place])   . Sleep apnea     Patient Active Problem List   Diagnosis Date Noted  . Near syncope 02/22/2018  . Hematochezia 02/16/2018  . Acute deep vein thrombosis (DVT) of popliteal vein of right lower extremity ([place]) 01/24/2017  . 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 DVT (deep venous thrombosis) ([place]) 01/22/2017  . S/P partial colectomy 01/21/2017  . Acute posthemorrhagic anemia 01/06/2017  . Encounter for  blood transfusion 01/06/2017  . Leukocytosis 01/06/2017  . Hypokalemia 01/06/2017  . History of GI diverticular bleed   . BRBPR (bright red blood per rectum) 12/27/2016  . Stage 2 moderate COPD by GOLD classification ([place]) 05/03/2016  . High risk medication use 05/04/2015  . Sleep apnea 05/04/2015  . 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 Hearing loss 11/07/2014  . Hypercholesteremia 11/07/2014  . Hypertension 11/07/2014  . Lumbar degenerative disc disease 11/07/2014  . Proteinuria due to type 2 diabetes mellitus ([place]) 11/07/2014  . Arthrosis of knee 06/22/2014  . Degenerative joint disease of knee, left 05/06/2014  . Status post total right knee replacement 05/06/2014  . History of adenomatous polyp of colon 01/31/2007    Past Surgical History:  Procedure Laterality Date  . ATRIAL ABLATION SURGERY    . CHOLECYSTECTOMY    . COLONOSCOPY WITH PROPOFOL N/A 09/25/2017   Procedure: COLONOSCOPY WITH PROPOFOL;  Surgeon: Manya Silvas, MD;  Location: [facility] ENDOSCOPY;  Service: Endoscopy;  Laterality: N/A;  . COLONOSCOPY WITH PROPOFOL N/A 02/18/2018   Procedure: COLONOSCOPY WITH PROPOFOL;  Surgeon: [name], MD;  Location: [facility] ENDOSCOPY;  Service: Endoscopy;  Laterality: N/A;  . JOINT REPLACEMENT Right    knee  . LAPAROTOMY  01/01/2017   Procedure: EXPLORATORY LAPAROTOMY;  Surgeon: Olean Ree, MD;  Location: [facility] ORS;  Service: General;;  . PARTIAL COLECTOMY N/A 01/01/2017   Procedure: PARTIAL COLECTOMY;  Surgeon: Olean Ree, MD;  Location: [facility] ORS;  Service: General;  Laterality: N/A;  .  PERIPHERAL VASCULAR CATHETERIZATION N/A 12/28/2016   Procedure: Visceral Artery Intervention;  Surgeon: Algernon Huxley, MD;  Location: [place] CV LAB;  Service: Cardiovascular;  Laterality: N/A;  . SECONDARY CLOSURE OF WOUND N/A 01/08/2017   Procedure: SECONDARY CLOSURE FOR EVISCERATION;  Surgeon: Florene Glen, MD;  Location: [facility] ORS;   Service: General;  Laterality: N/A;  . SIGMOIDECTOMY    . TOTAL KNEE ARTHROPLASTY Right     Prior to Admission medications   Medication Sig Start Date End Date Taking? Authorizing Provider  acetaminophen (TYLENOL) 500 MG tablet Take 1,000 mg by mouth every 6 (six) hours as needed.   Yes [provider]  amLODipine (NORVASC) 5 MG tablet Take 5 mg by mouth daily. 05/03/16  Yes [provider]  aspirin EC 81 MG EC tablet Take 1 tablet (81 mg total) by mouth daily. 01/16/17  Yes Loflin, Lavona Mound, MD  Cholecalciferol (VITAMIN D3) 2000 units capsule Take 2,000 Units by mouth daily.   Yes [provider]  docusate calcium (SURFAK) 240 MG capsule Take 1 capsule by mouth daily.   Yes [provider]  dorzolamide-timolol (COSOPT) 22.3-6.8 MG/ML ophthalmic solution Apply 1 drop to eye 2 (two) times daily.   Yes [provider]  ferrous gluconate (FERGON) 324 MG tablet Take 1 tablet (324 mg total) by mouth 2 (two) times daily with a meal. 01/06/17  Yes Theodoro Grist, MD  latanoprost (XALATAN) 0.005 % ophthalmic solution Apply 1 drop to eye at bedtime.   Yes [provider]  metFORMIN (GLUCOPHAGE-XR) 500 MG 24 hr tablet 1,000 mg daily with breakfast.  08/30/17  Yes [provider]  Multiple Vitamins-Minerals (CENTRUM SILVER PO) Take by mouth every morning.   Yes [provider]  Omega-3 Fatty Acids (FISH OIL) 1200 MG CAPS Take 2 capsules by mouth daily.   Yes [provider]  pravastatin (PRAVACHOL) 40 MG tablet Take 40 mg by mouth daily. 05/03/16  Yes [provider]  [facility] injection  08/15/17   [provider]    Allergies Azithromycin; Penicillins; Lisinopril; Losartan; and Metoprolol  Family History  Problem Relation Age of Onset  . Diabetes Sister   . Diabetes Brother   . Diabetes Sister   . Diabetes Brother     Social History Social History   Tobacco Use  . Smoking status: Former Smoker     Packs/day: 0.25    Years: 29.00    Pack years: 7.25    Types: Cigarettes    Last attempt to quit: 12/31/1993    Years since quitting: 24.1  . Smokeless tobacco: Never Used  Substance Use Topics  . Alcohol use: No    Alcohol/week: 0.0 oz  . Drug use: No    Review of Systems  Constitutional: Positive for near syncope.  No fever/chills. Eyes: No visual changes. ENT: No sore throat. Cardiovascular: Denies chest pain. Respiratory: Denies shortness of breath. Gastrointestinal: No abdominal pain.  No nausea, no vomiting.  No diarrhea.  No constipation.  Positive for bloody stools. Genitourinary: Negative for dysuria. Musculoskeletal: Negative for back pain. Skin: Negative for rash. Neurological: Negative for headaches, focal weakness or numbness.   ____________________________________________   PHYSICAL EXAM:  VITAL SIGNS: ED Triage Vitals  Enc Vitals Group     BP      Pulse      Resp      Temp      Temp src      SpO2      Weight  Height      Head Circumference      Peak Flow      Pain Score      Pain Loc      Pain Edu?      Excl. in [place]?     Constitutional: Alert and oriented.  Fatigued appearing and in mild acute distress. Eyes: Conjunctivae are normal. PERRL. EOMI. Head: Atraumatic. Nose: No congestion/rhinnorhea. Mouth/Throat: Mucous membranes are moist.  Oropharynx non-erythematous. Neck: No stridor.   Cardiovascular: Tachycardic rate, regular rhythm. Grossly normal heart sounds.  Good peripheral circulation. Respiratory: Normal respiratory effort.  No retractions. Lungs CTAB. Gastrointestinal: Soft and nontender to light or deep palpation. No distention. No abdominal bruits. No CVA tenderness. Musculoskeletal: No lower extremity tenderness nor edema.  No joint effusions. Neurologic:  Normal speech and language. No gross focal neurologic deficits are appreciated.  Skin:  Skin is warm, dry and intact. No rash noted. Psychiatric: Mood and affect are normal.  Speech and behavior are normal.  ____________________________________________   LABS (all labs ordered are listed, but only abnormal results are displayed)  Labs Reviewed  CBC WITH DIFFERENTIAL/PLATELET - Abnormal; Notable for the following components:      Result Value   WBC 13.9 (*)    RBC 3.40 (*)    Hemoglobin 9.5 (*)    HCT 29.2 (*)    RDW 14.7 (*)    Neutro Abs 8.5 (*)    Lymphs Abs 3.9 (*)    Monocytes Absolute 1.3 (*)    All other components within normal limits  COMPREHENSIVE METABOLIC PANEL - Abnormal; Notable for the following components:   Glucose, Bld 284 (*)    Calcium 8.8 (*)    Total Protein 5.9 (*)    Albumin 3.4 (*)    All other components within normal limits  LACTIC ACID, PLASMA - Abnormal; Notable for the following components:   Lactic Acid, Venous 2.6 (*)    All other components within normal limits  TROPONIN I  PROTIME-INR  LACTIC ACID, PLASMA  TYPE AND SCREEN   ____________________________________________  EKG  ED ECG REPORT I, SUNG,JADE J, the attending physician, personally viewed and interpreted this ECG.   Date: 02/22/2018  EKG Time: 0510  Rate: 129  Rhythm: sinus tachycardia  Axis: Normal  Intervals:none  ST&T Change: Nonspecific  ____________________________________________  RADIOLOGY  ED MD interpretation: None  Official radiology report(s): No results found.  ____________________________________________   PROCEDURES  Procedure(s) performed:   Rectal exam: External exam within normal limits without hemorrhoids or fissure.  Hematochezia on gloved finger.  Procedures  Critical Care performed: Yes, see critical care note(s)   CRITICAL CARE Performed by: Paulette Blanch   Total critical care time: 30 minutes  Critical care time was exclusive of separately billable procedures and treating other patients.  Critical care was necessary to treat or prevent imminent or life-threatening deterioration.  Critical care was  time spent personally by me on the following activities: development of treatment plan with patient and/or surrogate as well as nursing, discussions with consultants, evaluation of patient's response to treatment, examination of patient, obtaining history from patient or surrogate, ordering and performing treatments and interventions, ordering and review of laboratory studies, ordering and review of radiographic studies, pulse oximetry and re-evaluation of patient's condition.  ____________________________________________   INITIAL IMPRESSION / ASSESSMENT AND PLAN / ED COURSE  As part of my medical decision making, I reviewed the following data within the [place] notes reviewed and incorporated, Labs  reviewed, EKG interpreted, Old chart reviewed, Discussed with admitting physician and Notes from prior ED visits.   73 year old male with past history of significant diverticular bleed and recent hospitalization for rectal bleeding returns for hematochezia. Differential diagnosis includes, but is not limited to, acute appendicitis, renal colic, testicular torsion, urinary tract infection/pyelonephritis, prostatitis,  epididymitis, diverticulitis, small bowel obstruction or ileus, colitis, abdominal aortic aneurysm, gastroenteritis, hernia, etc.  Patient arrives tachycardic, pale, clammy complaining of dizziness.  Will obtain screening lab work including type and screen, initiate IV fluid resuscitation.  Anticipate hospitalization.      ____________________________________________   FINAL CLINICAL IMPRESSION(S) / ED DIAGNOSES  Final diagnoses:  Rectal bleeding  Hematochezia  Near syncope     ED Discharge Orders    None       Note:  This document was prepared using Dragon voice recognition software and may include unintentional dictation errors.    Paulette Blanch, MD 02/22/18 [phone]

## 2018-02-22 NOTE — Progress Notes (Signed)
02/22/2018 9:30 AM  Notified by lab that pt lactic acid measured at 2.5  Paged attending physician Gouru to make aware of critical value.  Will continue to monitor and assess Dola Argyle, RN

## 2018-02-22 NOTE — Progress Notes (Signed)
[name] NAME: Evan [name]    MR#:  [id number]  DATE OF BIRTH:  Apr 07, 1945  SUBJECTIVE:  CHIEF COMPLAINT: Patient resting comfortably during my examination.  Denies any abdominal pain or bleeding but eventually he had a large bowel movement with blood.  REVIEW OF SYSTEMS:  CONSTITUTIONAL: No fever, fatigue or weakness.  EYES: No blurred or double vision.  EARS, NOSE, AND THROAT: No tinnitus or ear pain.  RESPIRATORY: No cough, shortness of breath, wheezing or hemoptysis.  CARDIOVASCULAR: No chest pain, orthopnea, edema.  GASTROINTESTINAL: No nausea, vomiting, diarrhea or abdominal pain.  Reporting bowel movements with blood GENITOURINARY: No dysuria, hematuria.  ENDOCRINE: No polyuria, nocturia,  HEMATOLOGY: No anemia, easy bruising or bleeding SKIN: No rash or lesion. MUSCULOSKELETAL: No joint pain or arthritis.   NEUROLOGIC: No tingling, numbness, weakness.  PSYCHIATRY: No anxiety or depression.   DRUG ALLERGIES:   Allergies  Allergen Reactions  . Azithromycin Rash  . Penicillins Rash    Has patient had a PCN reaction causing immediate rash, facial/tongue/throat swelling, SOB or lightheadedness with hypotension: Yes Has patient had a PCN reaction causing severe rash involving mucus membranes or skin necrosis: Yes Has patient had a PCN reaction that required hospitalization No Has patient had a PCN reaction occurring within the last 10 years: No If all of the above answers are "NO", then may proceed with Cephalosporin use. Has patient had a PCN reaction causing immediate rash, facial/tongue/throat swelling, SOB or lightheadedness with hypotension: Yes Has patient had a PCN reaction causing severe rash involving mucus membranes or skin necrosis: Yes Has patient had a PCN reaction that required hospitalization No Has patient had a PCN reaction occurring within the last 10 years: No If all of the above answers are  "NO", then may proceed with Cephalosporin use.   [name] Lisinopril Cough  . Losartan Cough  . Metoprolol Other (See Comments)    Bradycardia.    VITALS:  Blood pressure 126/65, pulse 85, temperature 99 F (37.2 C), temperature source Oral, resp. rate 20, height 6' 4" (1.93 m), weight 132 kg (291 lb), SpO2 98 %.  PHYSICAL EXAMINATION:  GENERAL:  73 y.o.-year-old patient lying in the bed with no acute distress.  EYES: Pupils equal, round, reactive to light and accommodation. No scleral icterus. Extraocular muscles intact.  HEENT: Head atraumatic, normocephalic. Oropharynx and nasopharynx clear.  NECK:  Supple, no jugular venous distention. No thyroid enlargement, no tenderness.  LUNGS: Normal breath sounds bilaterally, no wheezing, rales,rhonchi or crepitation. No use of accessory muscles of respiration.  CARDIOVASCULAR: S1, S2 normal. No murmurs, rubs, or gallops.  ABDOMEN: Soft, nontender, nondistended. Bowel sounds present.  EXTREMITIES: No pedal edema, cyanosis, or clubbing.  NEUROLOGIC: Cranial nerves II through XII are intact. Muscle strength 5/5 in all extremities. Sensation intact. Gait not checked.  PSYCHIATRIC: The patient is alert and oriented x 3.  SKIN: No obvious rash, lesion, or ulcer.    LABORATORY PANEL:   CBC Recent Labs  Lab 02/22/18 0510  WBC 13.9*  HGB 9.5*  HCT 29.2*  PLT 351   ------------------------------------------------------------------------------------------------------------------  Chemistries  Recent Labs  Lab 02/22/18 0510  NA 139  K 3.9  CL 105  CO2 25  GLUCOSE 284*  BUN 15  CREATININE 0.94  CALCIUM 8.8*  AST 20  ALT 18  ALKPHOS 64  BILITOT 0.8   ------------------------------------------------------------------------------------------------------------------  Cardiac Enzymes Recent Labs  Lab 02/22/18 0510  TROPONINI <0.03    ------------------------------------------------------------------------------------------------------------------  RADIOLOGY:  No results found.  EKG:   Orders placed or performed during the hospital encounter of 02/22/18  . EKG 12-Lead  . EKG 12-Lead    ASSESSMENT AND PLAN:   This is a 73 year old male admitted for near syncope.  1.  Near syncope: Secondary to GI bleeding and volume loss.   Volume resuscitate with normal saline.   Transfuse blood if the patient remains orthostatic or significantly tachycardic. Echocardiogram   cardiogenic etiology of syncope. Follow-up with GI  2.  Rectal bleeding: Diverticular versus AVM bleed  Recent workup negative.  Patient is actively bleeding at this time Tagged red blood cell scan ordered N.p.o. except meds PPI IV fluids Avoid NSAIDs stopped aspirin Follow-up with gastroenterology   Cipro and Flagyl for diverticulosis-started by the admitting physician Sepsis from diverticulosis patient met criteria at the time of admission with hypotension, elevated lactic acid and leukocytosis  3.  Hypertension: Controlled; resume antihypertensive medications with the patient's blood pressure is normalized.  4.  Diabetes mellitus type 2: Elevated blood sugars likely secondary to diverticular infection.  Sliding scale insulin while hospitalized.  Hold oral hypoglycemic agents.  5.  History of atrial fibrillation: The patient is rate controlled and not currently on anticoagulation except aspirin.  Continue to monitor telemetry.  6.  CHF: Presumably when the patient had uncontrolled atrial fibrillation.  I have reviewed his records and he does not take diuretic therapy nor antiarrhythmics or digoxin.  Monitor for signs or symptoms of heart failure.  7.  DVT prophylaxis: SCDs       All the records are reviewed and case discussed with Care Management/Social Workerr. Management plans discussed with the patient, family and they are in  agreement.  CODE STATUS: fc   TOTAL TIME TAKING CARE OF THIS PATIENT: 36 minutes.   POSSIBLE D/C IN 2 DAYS, DEPENDING ON CLINICAL CONDITION.  Note: This dictation was prepared with Dragon dictation along with smaller phrase technology. Any transcriptional errors that result from this process are unintentional.   Nicholes Mango M.D on 02/22/2018 at 1:15 PM  Between 7am to 6pm - Pager - [phone] After 6pm go to [email or web address] - password [email or web address] [facility]  [place] [facility]  Office  [phone]  CC: Primary care physician; Ezequiel Kayser, MD

## 2018-02-22 NOTE — H&P (Signed)
Evan [name] is an 73 y.o. male.   Chief Complaint: Near syncope HPI: The patient with past medical history of rectal bleeding, atrial fibrillation, hypertension and diabetes presents to the emergency department after an episode of lightheadedness that followed significant rectal bleeding.  The patient was just admitted to the hospital for a rectal bleed but endoscopy did not reveal source.  His hemoglobin has dropped 1 g since that time.  The patient is currently asymptomatic and did not actually lose consciousness but did become weak and collapsed.  Due to recurrence of GI bleeding as well as comorbidities the emergency department staff asked the hospitalist service for further evaluation.  Past Medical History:  Diagnosis Date  . Adenoma of colon   . Arthritis    xDJD  . Atrial fibrillation ([place])   . BPH (benign prostatic hyperplasia)   . CHF (congestive heart failure) ([place])   . Diabetes mellitus without complication ([place])   . Discitis   . Diverticulitis   . Diverticulosis   . DVT (deep venous thrombosis) ([place])   . Erectile dysfunction   . Hearing loss   . Hyperlipidemia   . Hypertension   . Ischemic optic neuropathy   . Lower GI bleeding   . Non-ischemic cardiomyopathy ([place])   . Sleep apnea     Past Surgical History:  Procedure Laterality Date  . ATRIAL ABLATION SURGERY    . CHOLECYSTECTOMY    . COLONOSCOPY WITH PROPOFOL N/A 09/25/2017   Procedure: COLONOSCOPY WITH PROPOFOL;  Surgeon: Manya Silvas, MD;  Location: [facility] ENDOSCOPY;  Service: Endoscopy;  Laterality: N/A;  . COLONOSCOPY WITH PROPOFOL N/A 02/18/2018   Procedure: COLONOSCOPY WITH PROPOFOL;  Surgeon: [name], MD;  Location: [facility] ENDOSCOPY;  Service: Endoscopy;  Laterality: N/A;  . JOINT REPLACEMENT Right    knee  . LAPAROTOMY  01/01/2017   Procedure: EXPLORATORY LAPAROTOMY;  Surgeon: Olean Ree, MD;  Location: [facility] ORS;  Service: General;;  . PARTIAL COLECTOMY N/A 01/01/2017   Procedure: PARTIAL COLECTOMY;   Surgeon: Olean Ree, MD;  Location: [facility] ORS;  Service: General;  Laterality: N/A;  . PERIPHERAL VASCULAR CATHETERIZATION N/A 12/28/2016   Procedure: Visceral Artery Intervention;  Surgeon: Algernon Huxley, MD;  Location: [place] CV LAB;  Service: Cardiovascular;  Laterality: N/A;  . SECONDARY CLOSURE OF WOUND N/A 01/08/2017   Procedure: SECONDARY CLOSURE FOR EVISCERATION;  Surgeon: Florene Glen, MD;  Location: [facility] ORS;  Service: General;  Laterality: N/A;  . SIGMOIDECTOMY    . TOTAL KNEE ARTHROPLASTY Right     Family History  Problem Relation Age of Onset  . Diabetes Sister   . Diabetes Brother   . Diabetes Sister   . Diabetes Brother    Social History:  reports that he quit smoking about 24 years ago. His smoking use included cigarettes. He has a 7.25 pack-year smoking history. he has never used smokeless tobacco. He reports that he does not drink alcohol or use drugs.  Allergies:  Allergies  Allergen Reactions  . Azithromycin Rash  . Penicillins Rash    Has patient had a PCN reaction causing immediate rash, facial/tongue/throat swelling, SOB or lightheadedness with hypotension: Yes Has patient had a PCN reaction causing severe rash involving mucus membranes or skin necrosis: Yes Has patient had a PCN reaction that required hospitalization No Has patient had a PCN reaction occurring within the last 10 years: No If all of the above answers are "NO", then may proceed with Cephalosporin use. Has patient had a PCN  reaction causing immediate rash, facial/tongue/throat swelling, SOB or lightheadedness with hypotension: Yes Has patient had a PCN reaction causing severe rash involving mucus membranes or skin necrosis: Yes Has patient had a PCN reaction that required hospitalization No Has patient had a PCN reaction occurring within the last 10 years: No If all of the above answers are "NO", then may proceed with Cephalosporin use.   [name] Lisinopril Cough  . Losartan Cough  .  Metoprolol Other (See Comments)    Bradycardia.    Prior to Admission medications   Medication Sig Start Date End Date Taking? Authorizing Provider  acetaminophen (TYLENOL) 500 MG tablet Take 1,000 mg by mouth every 6 (six) hours as needed.   Yes [provider]  amLODipine (NORVASC) 5 MG tablet Take 5 mg by mouth daily. 05/03/16  Yes [provider]  aspirin EC 81 MG EC tablet Take 1 tablet (81 mg total) by mouth daily. 01/16/17  Yes Loflin, Lavona Mound, MD  Cholecalciferol (VITAMIN D3) 2000 units capsule Take 2,000 Units by mouth daily.   Yes [provider]  docusate calcium (SURFAK) 240 MG capsule Take 1 capsule by mouth daily.   Yes [provider]  dorzolamide-timolol (COSOPT) 22.3-6.8 MG/ML ophthalmic solution Apply 1 drop to eye 2 (two) times daily.   Yes [provider]  ferrous gluconate (FERGON) 324 MG tablet Take 1 tablet (324 mg total) by mouth 2 (two) times daily with a meal. 01/06/17  Yes Theodoro Grist, MD  latanoprost (XALATAN) 0.005 % ophthalmic solution Apply 1 drop to eye at bedtime.   Yes [provider]  metFORMIN (GLUCOPHAGE-XR) 500 MG 24 hr tablet 1,000 mg daily with breakfast.  08/30/17  Yes [provider]  Multiple Vitamins-Minerals (CENTRUM SILVER PO) Take by mouth every morning.   Yes [provider]  Omega-3 Fatty Acids (FISH OIL) 1200 MG CAPS Take 2 capsules by mouth daily.   Yes [provider]  pravastatin (PRAVACHOL) 40 MG tablet Take 40 mg by mouth daily. 05/03/16  Yes [provider]  [facility] injection  08/15/17   [provider]     Results for orders placed or performed during the hospital encounter of 02/22/18 (from the past 48 hour(s))  CBC with Differential     Status: Abnormal   Collection Time: 02/22/18  5:10 AM  Result Value Ref Range   WBC 13.9 (H) 3.8 - 10.6 K/uL   RBC 3.40 (L) 4.40 - 5.90 MIL/uL   Hemoglobin 9.5 (L) 13.0 - 18.0 g/dL   HCT 29.2 (L) 40.0  - 52.0 %   MCV 86.0 80.0 - 100.0 fL   MCH 28.0 26.0 - 34.0 pg   MCHC 32.5 32.0 - 36.0 g/dL   RDW 14.7 (H) 11.5 - 14.5 %   Platelets 351 150 - 440 K/uL   Neutrophils Relative % 62 %   Neutro Abs 8.5 (H) 1.4 - 6.5 K/uL   Lymphocytes Relative 28 %   Lymphs Abs 3.9 (H) 1.0 - 3.6 K/uL   Monocytes Relative 9 %   Monocytes Absolute 1.3 (H) 0.2 - 1.0 K/uL   Eosinophils Relative 1 %   Eosinophils Absolute 0.2 0 - 0.7 K/uL   Basophils Relative 0 %   Basophils Absolute 0.0 0 - 0.1 K/uL    Comment: Performed at [facility], [address] [place], [place] [address]  Comprehensive metabolic panel     Status: Abnormal   Collection Time: 02/22/18  5:10 AM  Result Value Ref  Range   Sodium 139 135 - 145 mmol/L   Potassium 3.9 3.5 - 5.1 mmol/L   Chloride 105 101 - 111 mmol/L   CO2 25 22 - 32 mmol/L   Glucose, Bld 284 (H) 65 - 99 mg/dL   BUN 15 6 - 20 mg/dL   Creatinine, Ser 0.94 0.61 - 1.24 mg/dL   Calcium 8.8 (L) 8.9 - 10.3 mg/dL   Total Protein 5.9 (L) 6.5 - 8.1 g/dL   Albumin 3.4 (L) 3.5 - 5.0 g/dL   AST 20 15 - 41 U/L   ALT 18 17 - 63 U/L   Alkaline Phosphatase 64 38 - 126 U/L   Total Bilirubin 0.8 0.3 - 1.2 mg/dL   GFR calc non Af Amer >60 >60 mL/min   GFR calc Af Amer >60 >60 mL/min    Comment: (NOTE) The eGFR has been calculated using the CKD EPI equation. This calculation has not been validated in all clinical situations. eGFR's persistently <60 mL/min signify possible Chronic Kidney Disease.    Anion gap 9 5 - 15    Comment: Performed at [facility], [address] [place], [place] [address]  Troponin I     Status: None   Collection Time: 02/22/18  5:10 AM  Result Value Ref Range   Troponin I <0.03 <0.03 ng/mL    Comment: Performed at [facility], [place], [place] [address]  Lactic acid, plasma     Status: Abnormal   Collection Time: 02/22/18  5:10 AM  Result Value Ref Range   Lactic Acid, Venous 2.6 (HH) 0.5 - 1.9 mmol/L     Comment: CRITICAL RESULT CALLED TO, READ BACK BY AND VERIFIED WITH ALLISON PATE AT 3267 02/22/18.PMH Performed at [facility], [address] [place], [place] [address]   Protime-INR     Status: None   Collection Time: 02/22/18  5:10 AM  Result Value Ref Range   Prothrombin Time 13.7 11.4 - 15.2 seconds   INR 1.06     Comment: Performed at [facility], [address] [place], [place] [address]  Type and screen [place]     Status: None   Collection Time: 02/22/18  5:10 AM  Result Value Ref Range   ABO/RH(D) B POS    Antibody Screen NEG    Sample Expiration      02/25/2018 Performed at [facility], [address] [place], [place] [address]    No results found.  Review of Systems  Constitutional: Negative for chills and fever.  HENT: Negative for sore throat and tinnitus.   Eyes: Negative for blurred vision and redness.  Respiratory: Negative for cough and shortness of breath.   Cardiovascular: Negative for chest pain, palpitations, orthopnea and PND.  Gastrointestinal: Positive for blood in stool. Negative for abdominal pain, diarrhea, nausea and vomiting.  Genitourinary: Negative for dysuria, frequency and urgency.  Musculoskeletal: Negative for joint pain and myalgias.  Skin: Negative for rash.       No lesions  Neurological: Negative for speech change, focal weakness and weakness.  Endo/Heme/Allergies: Does not bruise/bleed easily.       No temperature intolerance  Psychiatric/Behavioral: Negative for depression and suicidal ideas.    Blood pressure (!) 90/51, pulse 85, temperature 98.2 F (36.8 C), temperature source Oral, resp. rate 17, height _0  (1.93 m), weight 132 kg (291 lb), SpO2 99 %. Physical Exam  Constitutional: He is oriented to person, place, and time.  He appears well-developed and well-nourished. No distress.  HENT:  Head: Normocephalic and atraumatic.  Mouth/Throat: Oropharynx is clear and  moist.  Eyes: Conjunctivae and EOM are normal. Pupils are equal, round, and reactive to light. No scleral icterus.  Neck: Normal range of motion. Neck supple. No JVD present. No tracheal deviation present. No thyromegaly present.  Cardiovascular: Normal rate, regular rhythm and normal heart sounds. Exam reveals no gallop and no friction rub.  No murmur heard. Respiratory: Effort normal and breath sounds normal. No respiratory distress. He has no wheezes.  GI: Soft. Bowel sounds are normal. He exhibits no distension and no mass. There is no tenderness. There is no rebound and no guarding.  Genitourinary:  Genitourinary Comments: Deferred  Musculoskeletal: Normal range of motion. He exhibits no edema.  Lymphadenopathy:    He has no cervical adenopathy.  Neurological: He is alert and oriented to person, place, and time. No cranial nerve deficit.  Skin: Skin is warm and dry. No rash noted. No erythema.  Psychiatric: He has a normal mood and affect. His behavior is normal. Judgment and thought content normal.     Assessment/Plan This is a 73 year old male admitted for near syncope. 1.  Near syncope: Secondary to GI bleeding and volume loss.  Volume resuscitate with normal saline.  Transfuse blood if the patient remains orthostatic or significantly tachycardic.  The patient does not have an echocardiogram on record.  I will place an order for cardiac imaging as well as cardiology consult to rule out cardiogenic etiology of syncope. 2.  Rectal bleeding: Recent workup negative.  The patient has history of diverticular bleed which is the likely etiology with this episode.  Mild leukocytosis and abdominal pain supports this diagnosis.  We start Cipro and Flagyl.  Consult gastroenterology. 3.  Hypertension: Controlled; resume antihypertensive medications with the patient's blood pressure is normalized. 4.  Diabetes mellitus type 2: Elevated blood sugars likely secondary to diverticular infection.  Sliding  scale insulin while hospitalized.  Hold oral hypoglycemic agents. 5.  History of atrial fibrillation: The patient is rate controlled and not currently on anticoagulation except aspirin.  Continue to monitor telemetry. 6.  CHF: Presumably when the patient had uncontrolled atrial fibrillation.  I have reviewed his records and he does not take diuretic therapy nor antiarrhythmics or digoxin.  Monitor for signs or symptoms of heart failure. 7.  DVT prophylaxis: SCDs 8.  GI prophylaxis: None The patient is a full code.  Time spent on admission orders and patient care approximately 45 minutes  Harrie Foreman, MD 02/22/2018, 6:35 AM

## 2018-02-22 NOTE — Progress Notes (Signed)
Pharmacy Antibiotic Note  Evan [name] is a 73 y.o. male admitted on 02/22/2018 with IAI.  Pharmacy has been consulted for cipro dosing.  Plan: Cipro 400 mg IV q12h  Height: 6\' 4"  (193 cm) Weight: 291 lb (132 kg) IBW/kg (Calculated) : 86.8  Temp (24hrs), Avg:98.3 F (36.8 C), Min:98.2 F (36.8 C), Max:98.3 F (36.8 C)  Recent Labs  Lab 02/16/18 1952 02/17/18 0350 02/18/18 0430 02/19/18 0404 02/22/18 0510  WBC 9.6 11.5* 10.3 11.2* 13.9*  CREATININE 0.91 1.11  --   --  0.94  LATICACIDVEN  --   --   --   --  2.6*    Estimated Creatinine Clearance: 103.8 mL/min (by C-G formula based on SCr of 0.94 mg/dL).    Allergies  Allergen Reactions  . Azithromycin Rash  . Penicillins Rash    Has patient had a PCN reaction causing immediate rash, facial/tongue/throat swelling, SOB or lightheadedness with hypotension: Yes Has patient had a PCN reaction causing severe rash involving mucus membranes or skin necrosis: Yes Has patient had a PCN reaction that required hospitalization No Has patient had a PCN reaction occurring within the last 10 years: No If all of the above answers are "NO", then may proceed with Cephalosporin use. Has patient had a PCN reaction causing immediate rash, facial/tongue/throat swelling, SOB or lightheadedness with hypotension: Yes Has patient had a PCN reaction causing severe rash involving mucus membranes or skin necrosis: Yes Has patient had a PCN reaction that required hospitalization No Has patient had a PCN reaction occurring within the last 10 years: No If all of the above answers are "NO", then may proceed with Cephalosporin use.   [name] Lisinopril Cough  . Losartan Cough  . Metoprolol Other (See Comments)    Bradycardia.    Antimicrobials this admission: Cipro/Flagyl 2/23 >>  Dose adjustments this admission:   Microbiology results: none  Thank you for allowing pharmacy to be a part of this patient's care.  Rocky Morel 02/22/2018 8:55 AM

## 2018-02-23 ENCOUNTER — Encounter: Payer: Self-pay | Admitting: Anesthesiology

## 2018-02-23 ENCOUNTER — Observation Stay: Admit: 2018-02-23 | Payer: Medicare HMO

## 2018-02-23 ENCOUNTER — Encounter
Admission: EM | Disposition: A | Discharge status: Home / Self Care | Payer: Self-pay | Source: Home / Self Care | Attending: Internal Medicine

## 2018-02-23 ENCOUNTER — Observation Stay: Payer: Medicare HMO | Admitting: Anesthesiology

## 2018-02-23 DIAGNOSIS — Z96651 Presence of right artificial knee joint: Secondary | ICD-10-CM | POA: Diagnosis present

## 2018-02-23 DIAGNOSIS — Z880 Allergy status to penicillin: Secondary | ICD-10-CM | POA: Diagnosis not present

## 2018-02-23 DIAGNOSIS — I428 Other cardiomyopathies: Secondary | ICD-10-CM | POA: Diagnosis present

## 2018-02-23 DIAGNOSIS — Z86718 Personal history of other venous thrombosis and embolism: Secondary | ICD-10-CM | POA: Diagnosis not present

## 2018-02-23 DIAGNOSIS — Z833 Family history of diabetes mellitus: Secondary | ICD-10-CM | POA: Diagnosis not present

## 2018-02-23 DIAGNOSIS — Z79899 Other long term (current) drug therapy: Secondary | ICD-10-CM | POA: Diagnosis not present

## 2018-02-23 DIAGNOSIS — N400 Enlarged prostate without lower urinary tract symptoms: Secondary | ICD-10-CM | POA: Diagnosis present

## 2018-02-23 DIAGNOSIS — I110 Hypertensive heart disease with heart failure: Secondary | ICD-10-CM | POA: Diagnosis present

## 2018-02-23 DIAGNOSIS — J449 Chronic obstructive pulmonary disease, unspecified: Secondary | ICD-10-CM | POA: Diagnosis present

## 2018-02-23 DIAGNOSIS — Z87891 Personal history of nicotine dependence: Secondary | ICD-10-CM | POA: Diagnosis not present

## 2018-02-23 DIAGNOSIS — K922 Gastrointestinal hemorrhage, unspecified: Secondary | ICD-10-CM | POA: Diagnosis present

## 2018-02-23 DIAGNOSIS — Z888 Allergy status to other drugs, medicaments and biological substances status: Secondary | ICD-10-CM | POA: Diagnosis not present

## 2018-02-23 DIAGNOSIS — K5731 Diverticulosis of large intestine without perforation or abscess with bleeding: Secondary | ICD-10-CM | POA: Diagnosis present

## 2018-02-23 DIAGNOSIS — K5730 Diverticulosis of large intestine without perforation or abscess without bleeding: Secondary | ICD-10-CM | POA: Diagnosis not present

## 2018-02-23 DIAGNOSIS — Z7984 Long term (current) use of oral hypoglycemic drugs: Secondary | ICD-10-CM | POA: Diagnosis not present

## 2018-02-23 DIAGNOSIS — H47019 Ischemic optic neuropathy, unspecified eye: Secondary | ICD-10-CM | POA: Diagnosis present

## 2018-02-23 DIAGNOSIS — E785 Hyperlipidemia, unspecified: Secondary | ICD-10-CM | POA: Diagnosis present

## 2018-02-23 DIAGNOSIS — Z9049 Acquired absence of other specified parts of digestive tract: Secondary | ICD-10-CM | POA: Diagnosis not present

## 2018-02-23 DIAGNOSIS — M1990 Unspecified osteoarthritis, unspecified site: Secondary | ICD-10-CM | POA: Diagnosis present

## 2018-02-23 DIAGNOSIS — Z7982 Long term (current) use of aspirin: Secondary | ICD-10-CM | POA: Diagnosis not present

## 2018-02-23 DIAGNOSIS — I4891 Unspecified atrial fibrillation: Secondary | ICD-10-CM | POA: Diagnosis present

## 2018-02-23 DIAGNOSIS — E1165 Type 2 diabetes mellitus with hyperglycemia: Secondary | ICD-10-CM | POA: Diagnosis present

## 2018-02-23 DIAGNOSIS — K921 Melena: Secondary | ICD-10-CM | POA: Diagnosis present

## 2018-02-23 DIAGNOSIS — Z881 Allergy status to other antibiotic agents status: Secondary | ICD-10-CM | POA: Diagnosis not present

## 2018-02-23 DIAGNOSIS — H9190 Unspecified hearing loss, unspecified ear: Secondary | ICD-10-CM | POA: Diagnosis present

## 2018-02-23 DIAGNOSIS — I509 Heart failure, unspecified: Secondary | ICD-10-CM | POA: Diagnosis present

## 2018-02-23 DIAGNOSIS — D62 Acute posthemorrhagic anemia: Secondary | ICD-10-CM | POA: Diagnosis present

## 2018-02-23 HISTORY — DX: Gastrointestinal hemorrhage, unspecified: K92.2

## 2018-02-23 HISTORY — PX: ESOPHAGOGASTRODUODENOSCOPY: SHX5428

## 2018-02-23 LAB — GLUCOSE, CAPILLARY
Glucose-Capillary: 118 mg/dL — ABNORMAL HIGH (ref 65–99)
Glucose-Capillary: 126 mg/dL — ABNORMAL HIGH (ref 65–99)
Glucose-Capillary: 134 mg/dL — ABNORMAL HIGH (ref 65–99)
Glucose-Capillary: 158 mg/dL — ABNORMAL HIGH (ref 65–99)
Glucose-Capillary: 99 mg/dL (ref 65–99)

## 2018-02-23 LAB — CBC
HCT: 17.3 % — ABNORMAL LOW (ref 40.0–52.0)
Hemoglobin: 5.6 g/dL — ABNORMAL LOW (ref 13.0–18.0)
MCH: 28.1 pg (ref 26.0–34.0)
MCHC: 32.4 g/dL (ref 32.0–36.0)
MCV: 86.8 fL (ref 80.0–100.0)
Platelets: 223 10*3/uL (ref 150–440)
RBC: 1.99 MIL/uL — AB (ref 4.40–5.90)
RDW: 14.6 % — ABNORMAL HIGH (ref 11.5–14.5)
WBC: 11.2 10*3/uL — AB (ref 3.8–10.6)

## 2018-02-23 LAB — HEMOGLOBIN AND HEMATOCRIT, BLOOD
HCT: 28.4 % — ABNORMAL LOW (ref 40.0–52.0)
HCT: 28.3 % — ABNORMAL LOW (ref 40.0–52.0)
HEMATOCRIT: 21.9 % — AB (ref 40.0–52.0)
HEMOGLOBIN: 7 g/dL — AB (ref 13.0–18.0)
Hemoglobin: 9.1 g/dL — ABNORMAL LOW (ref 13.0–18.0)
Hemoglobin: 9.7 g/dL — ABNORMAL LOW (ref 13.0–18.0)

## 2018-02-23 LAB — PREPARE RBC (CROSSMATCH)

## 2018-02-23 SURGERY — EGD (ESOPHAGOGASTRODUODENOSCOPY)
Anesthesia: General | Laterality: Left

## 2018-02-23 MED ORDER — PEG 3350-KCL-NA BICARB-NACL 420 G PO SOLR
4000.0000 mL | Freq: Once | ORAL | Status: AC
  Administered 2018-02-23: 4000 mL via ORAL
  Filled 2018-02-23: qty 4000

## 2018-02-23 MED ORDER — LIDOCAINE HCL (CARDIAC) 20 MG/ML IV SOLN
INTRAVENOUS | Status: DC | PRN
  Administered 2018-02-23: 100 mg via INTRAVENOUS

## 2018-02-23 MED ORDER — LIDOCAINE HCL (PF) 2 % IJ SOLN
INTRAMUSCULAR | Status: AC
  Filled 2018-02-23: qty 10

## 2018-02-23 MED ORDER — PROPOFOL 10 MG/ML IV BOLUS
INTRAVENOUS | Status: DC | PRN
  Administered 2018-02-23: 50 mg via INTRAVENOUS
  Administered 2018-02-23: 30 mg via INTRAVENOUS

## 2018-02-23 MED ORDER — SODIUM CHLORIDE 0.9 % IV SOLN
Freq: Once | INTRAVENOUS | Status: AC
  Administered 2018-02-23: 08:00:00 via INTRAVENOUS

## 2018-02-23 MED ORDER — METOPROLOL TARTRATE 5 MG/5ML IV SOLN: 5.0000 mg | INTRAVENOUS | Status: AC

## 2018-02-23 MED ORDER — PROPOFOL 500 MG/50ML IV EMUL
INTRAVENOUS | Status: AC
  Filled 2018-02-23: qty 50

## 2018-02-23 MED ORDER — SODIUM CHLORIDE 0.9 % IV SOLN: Freq: Once | INTRAVENOUS | Status: DC

## 2018-02-23 MED ORDER — PROPOFOL 500 MG/50ML IV EMUL
INTRAVENOUS | Status: DC | PRN
  Administered 2018-02-23: 200 ug/kg/min via INTRAVENOUS

## 2018-02-23 MED ORDER — SODIUM CHLORIDE 0.9 % IV SOLN: INTRAVENOUS | Status: DC

## 2018-02-23 NOTE — Progress Notes (Signed)
[facility] [place] NAME: Evan [name]    MR#:  [id number]  DATE OF BIRTH:  31-Jan-1945  SUBJECTIVE:  CHIEF COMPLAINT: Patient resting comfortably   Denies any abdominal pain or bleeding but eventually he had a large bowel movement with dark blood yesterday. Today Hb 5.6  REVIEW OF SYSTEMS:  CONSTITUTIONAL: No fever, fatigue or weakness.  EYES: No blurred or double vision.  EARS, NOSE, AND THROAT: No tinnitus or ear pain.  RESPIRATORY: No cough, shortness of breath, wheezing or hemoptysis.  CARDIOVASCULAR: No chest pain, orthopnea, edema.  GASTROINTESTINAL: No nausea, vomiting, diarrhea or abdominal pain.  Reporting bowel movements with blood GENITOURINARY: No dysuria, hematuria.  ENDOCRINE: No polyuria, nocturia,  HEMATOLOGY: No anemia, easy bruising or bleeding SKIN: No rash or lesion. MUSCULOSKELETAL: No joint pain or arthritis.   NEUROLOGIC: No tingling, numbness, weakness.  PSYCHIATRY: No anxiety or depression.   DRUG ALLERGIES:   Allergies  Allergen Reactions  . Azithromycin Rash  . Penicillins Rash    Has patient had a PCN reaction causing immediate rash, facial/tongue/throat swelling, SOB or lightheadedness with hypotension: Yes Has patient had a PCN reaction causing severe rash involving mucus membranes or skin necrosis: Yes Has patient had a PCN reaction that required hospitalization No Has patient had a PCN reaction occurring within the last 10 years: No If all of the above answers are "NO", then may proceed with Cephalosporin use. Has patient had a PCN reaction causing immediate rash, facial/tongue/throat swelling, SOB or lightheadedness with hypotension: Yes Has patient had a PCN reaction causing severe rash involving mucus membranes or skin necrosis: Yes Has patient had a PCN reaction that required hospitalization No Has patient had a PCN reaction occurring within the last 10 years: No If all of the above  answers are "NO", then may proceed with Cephalosporin use.   [name] Lisinopril Cough  . Losartan Cough  . Metoprolol Other (See Comments)    Bradycardia.    VITALS:  Blood pressure 135/72, pulse 85, temperature 98.4 F (36.9 C), temperature source Oral, resp. rate 16, height 6' 4"  (1.93 m), weight 133.1 kg (293 lb 6.9 oz), SpO2 100 %.  PHYSICAL EXAMINATION:  GENERAL:  73 y.o.-year-old patient lying in the bed with no acute distress.  EYES: Pupils equal, round, reactive to light and accommodation. No scleral icterus. Extraocular muscles intact.  HEENT: Head atraumatic, normocephalic. Oropharynx and nasopharynx clear.  NECK:  Supple, no jugular venous distention. No thyroid enlargement, no tenderness.  LUNGS: Normal breath sounds bilaterally, no wheezing, rales,rhonchi or crepitation. No use of accessory muscles of respiration.  CARDIOVASCULAR: S1, S2 normal. No murmurs, rubs, or gallops.  ABDOMEN: Soft, nontender, nondistended. Bowel sounds present.  EXTREMITIES: No pedal edema, cyanosis, or clubbing.  NEUROLOGIC: Cranial nerves II through XII are intact. Muscle strength 5/5 in all extremities. Sensation intact. Gait not checked.  PSYCHIATRIC: The patient is alert and oriented x 3.  SKIN: No obvious rash, lesion, or ulcer.    LABORATORY PANEL:   CBC Recent Labs  Lab 02/23/18 0409  WBC 11.2*  HGB 5.6*  HCT 17.3*  PLT 223   ------------------------------------------------------------------------------------------------------------------  Chemistries  Recent Labs  Lab 02/22/18 0510  NA 139  K 3.9  CL 105  CO2 25  GLUCOSE 284*  BUN 15  CREATININE 0.94  CALCIUM 8.8*  AST 20  ALT 18  ALKPHOS 64  BILITOT 0.8   ------------------------------------------------------------------------------------------------------------------  Cardiac Enzymes Recent Labs  Lab 02/22/18 0510  TROPONINI <0.03    ------------------------------------------------------------------------------------------------------------------  RADIOLOGY:  Nm Gi Blood Loss  Result Date: 02/22/2018 CLINICAL DATA:  Hematochezia. EXAM: NUCLEAR MEDICINE GASTROINTESTINAL BLEEDING SCAN TECHNIQUE: Sequential abdominal images were obtained following intravenous administration of Tc-61mlabeled red blood cells. Imaging was performed for 2 hr. RADIOPHARMACEUTICALS:  22.8 mCi Tc-965mertechnetate in-vitro labeled red cells. COMPARISON:  12/27/2016 FINDINGS: No abnormal radiopharmaceutical activity seen within the small or large bowel. Radiopharmaceutical activity matching blood pool is seen in the region of the genitalia, which is confirmed on the lateral projection and is unchanged compared to previous study. IMPRESSION: No evidence of active GI bleeding. Electronically Signed   By: JoEarle Gell.D.   On: 02/22/2018 17:00    EKG:   Orders placed or performed during the hospital encounter of 02/22/18  . EKG 12-Lead  . EKG 12-Lead    ASSESSMENT AND PLAN:   This is a [age]ear old male admitted for near syncope.  1.  Near syncope: Secondary to GI bleeding and volume loss.   Volume resuscitate with normal saline.   Transfuse 2 units of  blood Echocardiogram -results pending  cardiogenic etiology of syncope. Follow-up with GI  2.  Rectal bleeding: Diverticular versus AVM bleed  Recent workup negative.  Patient is actively bleeding at this time, on his way to EGD now Transfusion 2 units of blood and keeping 2 units ready for future needs Monitor hemoglobin hematocrit closely Tagged red blood cell scan is negative CT angiogram of the abdomen ordered as recommended by gastroenterology N.p.o. except meds PPI-2 times a day IV fluids Avoid NSAIDs stopped aspirin Follow-up with gastroenterology   Cipro and Flagyl for diverticulosis- Sepsis from diverticulosis patient met criteria at the time of admission with hypotension,  elevated lactic acid and leukocytosis  3.  Hypertension: Controlled; resume antihypertensive medications with the patient's blood pressure is normalized.  4.  Diabetes mellitus type 2: Elevated blood sugars likely secondary to diverticular infection.  Sliding scale insulin while hospitalized.  Hold oral hypoglycemic agents.  5.  History of atrial fibrillation: The patient is rate controlled and not currently on anticoagulation except aspirin.  Continue to monitor telemetry.  6.  CHF: Presumably when the patient had uncontrolled atrial fibrillation.  I have reviewed his records and he does not take diuretic therapy nor antiarrhythmics or digoxin.  Monitor for signs or symptoms of heart failure.  7.  DVT prophylaxis: SCDs       All the records are reviewed and case discussed with Care Management/Social Workerr. Management plans discussed with the patient, wife at bedside and they are in agreement.  CODE STATUS: fc   TOTAL TIME TAKING CARE OF THIS PATIENT: 36 minutes.   POSSIBLE D/C IN 2 DAYS, DEPENDING ON CLINICAL CONDITION.  Note: This dictation was prepared with Dragon dictation along with smaller phrase technology. Any transcriptional errors that result from this process are unintentional.   ArNicholes Mango.D on 02/23/2018 at 8:23 AM  Between 7am to 6pm - Pager - [phone] 6pm go to [email or web address] - password [email or web address] [facility]  Office  [phone]: Primary care physician; ThEzequiel KayserMD

## 2018-02-23 NOTE — Progress Notes (Signed)
02/23/2018 7:39 AM  Attempted IV start as pt needs protonix and blood as soon as possible.  Unsuccessful attempts documented.  Called [facility] who recommended IV team consulted for IV access. Placed consult order and will wait for access.  Dola Argyle, RN

## 2018-02-23 NOTE — Anesthesia Post-op Follow-up Note (Signed)
Anesthesia QCDR form completed.        

## 2018-02-23 NOTE — Anesthesia Postprocedure Evaluation (Signed)
Anesthesia Post Note  Patient: Evan [name]  Procedure(s) Performed: ESOPHAGOGASTRODUODENOSCOPY (EGD) (Left )  Patient location during evaluation: Endoscopy Anesthesia Type: General Level of consciousness: awake and alert Pain management: pain level controlled Vital Signs Assessment: post-procedure vital signs reviewed and stable Respiratory status: spontaneous breathing, nonlabored ventilation, respiratory function stable and patient connected to nasal cannula oxygen Cardiovascular status: blood pressure returned to baseline and stable Postop Assessment: no apparent nausea or vomiting Anesthetic complications: no     Last Vitals:  Vitals:   02/23/18 0844 02/23/18 0854  BP: 127/65 139/67  Pulse: (!) 105 88  Resp: 20   Temp: (!) 36.2 C   SpO2: 99% 100%    Last Pain:  Vitals:   02/23/18 0844  TempSrc: Temporal                 Lequisha Cammack S

## 2018-02-23 NOTE — Anesthesia Preprocedure Evaluation (Signed)
Anesthesia Evaluation  Patient identified by MRN, date of birth, ID band Patient awake    Reviewed: Allergy & Precautions, NPO status , Patient's Chart, lab work & pertinent test results, reviewed documented beta blocker date and time   Airway Mallampati: III  TM Distance: >3 FB     Dental  (+) Chipped   Pulmonary sleep apnea and Continuous Positive Airway Pressure Ventilation , COPD, former smoker,           Cardiovascular hypertension, Pt. on medications + Peripheral Vascular Disease and +CHF  + dysrhythmias Atrial Fibrillation      Neuro/Psych    GI/Hepatic   Endo/Other  diabetes, Type 2  Renal/GU Renal disease     Musculoskeletal  (+) Arthritis ,   Abdominal   Peds  Hematology  (+) anemia ,   Anesthesia Other Findings Has had ablation for AF. Continues to have PVCs. His rate has decreased after receiving blood. Hb 5.6 this am before blood.  Reproductive/Obstetrics                             Anesthesia Physical Anesthesia Plan  ASA: III  Anesthesia Plan: General   Post-op Pain Management:    Induction: Intravenous  PONV Risk Score and Plan:   Airway Management Planned:   Additional Equipment:   Intra-op Plan:   Post-operative Plan:   Informed Consent: I have reviewed the patients History and Physical, chart, labs and discussed the procedure including the risks, benefits and alternatives for the proposed anesthesia with the patient or authorized representative who has indicated his/her understanding and acceptance.     Plan Discussed with: CRNA  Anesthesia Plan Comments:         Anesthesia Quick Evaluation

## 2018-02-23 NOTE — Anesthesia Procedure Notes (Signed)
Date/Time: 02/23/2018 8:33 AM Performed by: Johnna Acosta, CRNA Pre-anesthesia Checklist: Patient identified, Emergency Drugs available, Suction available, Patient being monitored and Timeout performed Patient Re-evaluated:Patient Re-evaluated prior to induction Oxygen Delivery Method: Nasal cannula

## 2018-02-23 NOTE — Op Note (Signed)
[facility] Gastroenterology Patient Name: Rachit Grim Procedure Date: 02/23/2018 8:27 AM MRN: [id number] Account #: [id number] Date of Birth: Sep 01, 1945 Admit Type: Inpatient Age: 73 Room: [facility] ENDO ROOM 4 Gender: Male Note Status: Finalized Procedure:            Upper GI endoscopy Indications:          Hematochezia Providers:            Brynlei Klausner B. Bonna Gains MD, MD Referring MD:         Christena Flake. Raechel Ache, MD (Referring MD) Medicines:            Monitored Anesthesia Care Complications:        No immediate complications. Procedure:            Pre-Anesthesia Assessment:                       - The risks and benefits of the procedure and the                        sedation options and risks were discussed with the                        patient. All questions were answered and informed                        consent was obtained.                       - Patient identification and proposed procedure were                        verified prior to the procedure.                       - ASA Grade Assessment: III - A patient with severe                        systemic disease.                       After obtaining informed consent, the endoscope was                        passed under direct vision. Throughout the procedure,                        the patient's blood pressure, pulse, and oxygen                        saturations were monitored continuously. The Endoscope                        was introduced through the mouth, and advanced to the                        third part of duodenum. The upper GI endoscopy was                        accomplished with ease. The patient tolerated the  procedure well. Findings:      The examined esophagus was normal.      There is no endoscopic evidence of bleeding in the entire esophagus.      The entire examined stomach was normal.      There is no endoscopic evidence of bleeding or ulceration in the entire        examined stomach.      The examined duodenum was normal.      There is no endoscopic evidence of bleeding in the entire examined       duodenum. Impression:           - Normal esophagus.                       - Normal stomach.                       - Normal examined duodenum.                       - No specimens collected.                       - Patien's GI bleeding is likely due to diverticulosis                        (given his history of diverticulosis with bleeding                        requiring embolization in the past, and requiring                        surgery for it last year) Recommendation:       - Return patient to hospital ward for ongoing care.                       - Ok to stop PPI at this time                       - Clear liquid diet.                       - Perform a colonoscopy tomorrow.                       - If colonoscopy is negative a small bowel capsule can                        be considered as the next step.                       - Refer to a colo-rectal surgeon today, due to possible                        recurrent GI bleeding from diverticulosis                       - Continue Serial CBCs and transfuse PRN                       - The findings and recommendations were discussed with  the patient.                       - The findings and recommendations were discussed with                        the patient's family. Procedure Code(s):    --- Professional ---                       [phone], Esophagogastroduodenoscopy, flexible, transoral;                        diagnostic, including collection of specimen(s) by                        brushing or washing, when performed (separate procedure) Diagnosis Code(s):    --- Professional ---                       K92.1, Melena (includes Hematochezia) CPT copyright 2016 American Medical Association. All rights reserved. The codes documented in this report are preliminary and upon coder  review may  be revised to meet current compliance requirements.  [name], MD Margretta Sidle B. Bonna Gains MD, MD 02/23/2018 9:02:00 AM This report has been signed electronically. Number of Addenda: 0 Note Initiated On: 02/23/2018 8:27 AM      [facility]

## 2018-02-23 NOTE — Transfer of Care (Signed)
Immediate Anesthesia Transfer of Care Note  Patient: Yousef Huge  Procedure(s) Performed: ESOPHAGOGASTRODUODENOSCOPY (EGD) (Left )  Patient Location: PACU  Anesthesia Type:General  Level of Consciousness: sedated  Airway & Oxygen Therapy: Patient Spontanous Breathing and Patient connected to nasal cannula oxygen  Post-op Assessment: Report given to RN and Post -op Vital signs reviewed and stable  Post vital signs: Reviewed and stable  Last Vitals:  Vitals:   02/23/18 0834 02/23/18 0844  BP: (!) 190/98 127/65  Pulse: 91 (!) 105  Resp: 20 20  Temp: 36.9 C (!) 36.2 C  SpO2: 98% 99%    Last Pain:  Vitals:   02/23/18 0844  TempSrc: Temporal         Complications: No apparent anesthesia complications

## 2018-02-23 NOTE — Progress Notes (Signed)
Notified Dr. Marcille Blanco of patient hgb of 5.6. No order put in at this time.

## 2018-02-24 ENCOUNTER — Inpatient Hospital Stay: Payer: Medicare HMO | Admitting: Anesthesiology

## 2018-02-24 ENCOUNTER — Encounter
Admission: EM | Disposition: A | Discharge status: Home / Self Care | Payer: Self-pay | Source: Home / Self Care | Attending: Internal Medicine

## 2018-02-24 ENCOUNTER — Inpatient Hospital Stay
Admit: 2018-02-24 | Discharge: 2018-02-24 | Disposition: A | Discharge status: Home / Self Care | Payer: Medicare HMO | Attending: Internal Medicine | Admitting: Internal Medicine

## 2018-02-24 DIAGNOSIS — K5730 Diverticulosis of large intestine without perforation or abscess without bleeding: Secondary | ICD-10-CM

## 2018-02-24 HISTORY — PX: COLONOSCOPY: SHX5424

## 2018-02-24 LAB — BPAM RBC
BLOOD PRODUCT EXPIRATION DATE: 201903122359
Blood Product Expiration Date: 201903122359
Blood Product Expiration Date: 201903142359
Blood Product Expiration Date: 201903212359
Blood Product Expiration Date: 201903212359
ISSUE DATE / TIME: 201902240750
ISSUE DATE / TIME: 201902241036
UNIT TYPE AND RH: 5100
UNIT TYPE AND RH: 7300
Unit Type and Rh: 5100
Unit Type and Rh: 7300
Unit Type and Rh: 7300

## 2018-02-24 LAB — TYPE AND SCREEN
ABO/RH(D): B POS
Antibody Screen: NEGATIVE
UNIT DIVISION: 0
Unit division: 0
Unit division: 0
Unit division: 0
Unit division: 0

## 2018-02-24 LAB — GLUCOSE, CAPILLARY
Glucose-Capillary: 136 mg/dL — ABNORMAL HIGH (ref 65–99)
Glucose-Capillary: 118 mg/dL — ABNORMAL HIGH (ref 65–99)
Glucose-Capillary: 114 mg/dL — ABNORMAL HIGH (ref 65–99)

## 2018-02-24 LAB — CBC
HCT: 26.8 % — ABNORMAL LOW (ref 40.0–52.0)
HEMOGLOBIN: 8.9 g/dL — AB (ref 13.0–18.0)
MCH: 28.6 pg (ref 26.0–34.0)
MCHC: 33.1 g/dL (ref 32.0–36.0)
MCV: 86.3 fL (ref 80.0–100.0)
Platelets: 267 10*3/uL (ref 150–440)
RBC: 3.11 MIL/uL — AB (ref 4.40–5.90)
RDW: 15.2 % — ABNORMAL HIGH (ref 11.5–14.5)
WBC: 11.8 10*3/uL — AB (ref 3.8–10.6)

## 2018-02-24 LAB — HEMOGLOBIN AND HEMATOCRIT, BLOOD
HCT: 27.8 % — ABNORMAL LOW (ref 40.0–52.0)
HEMATOCRIT: 26.7 % — AB (ref 40.0–52.0)
HEMOGLOBIN: 8.9 g/dL — AB (ref 13.0–18.0)
Hemoglobin: 9.3 g/dL — ABNORMAL LOW (ref 13.0–18.0)

## 2018-02-24 LAB — ECHOCARDIOGRAM COMPLETE
Height: 76 in
Weight: 4627.9 oz

## 2018-02-24 SURGERY — COLONOSCOPY
Anesthesia: General | Laterality: Left

## 2018-02-24 MED ORDER — PHENYLEPHRINE HCL 10 MG/ML IJ SOLN
INTRAMUSCULAR | Status: DC | PRN
  Administered 2018-02-24: 100 ug via INTRAVENOUS
  Administered 2018-02-24: 200 ug via INTRAVENOUS
  Administered 2018-02-24: 100 ug via INTRAVENOUS
  Administered 2018-02-24: 150 ug via INTRAVENOUS
  Administered 2018-02-24: 100 ug via INTRAVENOUS

## 2018-02-24 MED ORDER — PROPOFOL 500 MG/50ML IV EMUL
INTRAVENOUS | Status: AC
  Filled 2018-02-24: qty 50

## 2018-02-24 MED ORDER — PROPOFOL 10 MG/ML IV BOLUS
INTRAVENOUS | Status: DC | PRN
  Administered 2018-02-24: 70 mg via INTRAVENOUS

## 2018-02-24 MED ORDER — PROPOFOL 500 MG/50ML IV EMUL
INTRAVENOUS | Status: AC
Start: 2018-02-24 — End: 2018-02-24
  Filled 2018-02-24: qty 50

## 2018-02-24 MED ORDER — EPHEDRINE SULFATE 50 MG/ML IJ SOLN
INTRAMUSCULAR | Status: DC | PRN
  Administered 2018-02-24 (×2): 5 mg via INTRAVENOUS

## 2018-02-24 MED ORDER — EPHEDRINE SULFATE 50 MG/ML IJ SOLN
INTRAMUSCULAR | Status: AC
  Filled 2018-02-24: qty 1

## 2018-02-24 MED ORDER — PROPOFOL 500 MG/50ML IV EMUL
INTRAVENOUS | Status: DC | PRN
  Administered 2018-02-24: 140 ug/kg/min via INTRAVENOUS

## 2018-02-24 NOTE — Progress Notes (Signed)
*  PRELIMINARY RESULTS* Echocardiogram 2D Echocardiogram has been performed.  Evan [name] 02/24/2018, 9:14 AM

## 2018-02-24 NOTE — Op Note (Addendum)
[facility] Gastroenterology Patient Name: Evan [name] Procedure Date: 02/24/2018 12:59 PM MRN: [id number] Account #: [id number] Date of Birth: 1945-10-12 Admit Type: Inpatient Age: 73 Room: [facility] ENDO ROOM 4 Gender: Male Note Status: Finalized Procedure:            Colonoscopy Indications:          Hematochezia Providers:            Kirstin Kugler B. Bonna Gains MD, MD Medicines:            Monitored Anesthesia Care Complications:        No immediate complications. Procedure:            Pre-Anesthesia Assessment:                       - ASA Grade Assessment: III - A patient with severe                        systemic disease.                       - Prior to the procedure, a History and Physical was                        performed, and patient medications, allergies and                        sensitivities were reviewed. The patient's tolerance of                        previous anesthesia was reviewed.                       After obtaining informed consent, the colonoscope was                        passed under direct vision. Throughout the procedure,                        the patient's blood pressure, pulse, and oxygen                        saturations were monitored continuously. The                        Colonoscope was introduced through the anus and                        advanced to the the terminal ileum. The colonoscopy was                        performed with ease. The patient tolerated the                        procedure well. The quality of the bowel preparation                        was fair except the ascending colon was poor. Findings:      The perianal and digital rectal examinations were normal.      The terminal Ileum was seen but could not be intubated due to looping of  the scope despite abdominal pressure. The brief view of the terminal       ileum did not show any blood in the terminal ileum.      Multiple small and large-mouthed  diverticula were found in the sigmoid       colon and ascending colon. These were not bleeding.      The retroflexed view of the distal rectum and anal verge was normal and       showed no anal or rectal abnormalities.      No evidence of old or new blood was seen throughout the exam. The stool       was yellow to green in color with no black stool or red blood present. Impression:           - Diverticulosis in the sigmoid colon and in the                        ascending colon.                       - The distal rectum and anal verge are normal on                        retroflexion view.                       - No evidence of old or new blood was seen throughout                        the exam. The stool was yellow to green in color with                        no black stool or red blood present.                       - No specimens collected.                       - Clinical history and findings are consistent with                        hematochezia on presentation being from diverticular                        bleeding. Pt. has had no further hematochezia yesterday                        with the prep and no blood is seen in the exam today,                        and Hgb has been stable around 8-9 since yesterday                        morning, consistent with resolved diverticular bleed. Recommendation:       - Advance diet as tolerated.                       - Continue present medications.                       - Return patient  to hospital ward for ongoing care.                       - Miralax 1 capful (17 grams) in 8 ounces of water PO                        daily.                       - If patient has recurrent Hematochezia would recommend                        RBC scan or Small bowel capsule at that time. Since                        bleeding has stopped at this time and clinical history                        is consistent with diverticular bleeding, no need for                         small bowel capsule today.                       - General Surgery consultation due to recurrent                        diverticular bleeding and previous history of surgery                        for the same in 2018.                       - Continue Serial CBCs and transfuse PRN                       - Further recommendations as per inpatient GI Team (Dr.                        Vicente Males over the week)                       - The findings and recommendations were discussed with                        the patient.                       - The findings and recommendations were discussed with                        the patient's family. Procedure Code(s):    --- Professional ---                       [phone], Colonoscopy, flexible; diagnostic, including                        collection of specimen(s) by brushing or washing, when                        performed (separate procedure) Diagnosis Code(s):    ---  Professional ---                       K92.1, Melena (includes Hematochezia)                       K57.30, Diverticulosis of large intestine without                        perforation or abscess without bleeding CPT copyright 2016 American Medical Association. All rights reserved. The codes documented in this report are preliminary and upon coder review may  be revised to meet current compliance requirements.  [name], MD Margretta Sidle B. Bonna Gains MD, MD 02/24/2018 2:12:27 PM This report has been signed electronically. Number of Addenda: 0 Note Initiated On: 02/24/2018 12:59 PM Scope Withdrawal Time: 0 hours 23 minutes 21 seconds  Total Procedure Duration: 0 hours 34 minutes 59 seconds  Estimated Blood Loss: Estimated blood loss: none.      [facility]

## 2018-02-24 NOTE — Progress Notes (Signed)
[facility] NAME: Evan [name]    MR#:  [id number]  DATE OF BIRTH:  11/01/1945  SUBJECTIVE:  CHIEF COMPLAINT: Patient resting comfortably patient had EGD yesterday which was normal and scheduled for colonoscopy today afternoon  REVIEW OF SYSTEMS:  CONSTITUTIONAL: No fever, fatigue or weakness.  EYES: No blurred or double vision.  EARS, NOSE, AND THROAT: No tinnitus or ear pain.  RESPIRATORY: No cough, shortness of breath, wheezing or hemoptysis.  CARDIOVASCULAR: No chest pain, orthopnea, edema.  GASTROINTESTINAL: No nausea, vomiting, diarrhea or abdominal pain.  Reporting bowel movements with blood GENITOURINARY: No dysuria, hematuria.  ENDOCRINE: No polyuria, nocturia,  HEMATOLOGY: No anemia, easy bruising or bleeding SKIN: No rash or lesion. MUSCULOSKELETAL: No joint pain or arthritis.   NEUROLOGIC: No tingling, numbness, weakness.  PSYCHIATRY: No anxiety or depression.   DRUG ALLERGIES:   Allergies  Allergen Reactions  . Azithromycin Rash  . Penicillins Rash    Has patient had a PCN reaction causing immediate rash, facial/tongue/throat swelling, SOB or lightheadedness with hypotension: Yes Has patient had a PCN reaction causing severe rash involving mucus membranes or skin necrosis: Yes Has patient had a PCN reaction that required hospitalization No Has patient had a PCN reaction occurring within the last 10 years: No If all of the above answers are "NO", then may proceed with Cephalosporin use. Has patient had a PCN reaction causing immediate rash, facial/tongue/throat swelling, SOB or lightheadedness with hypotension: Yes Has patient had a PCN reaction causing severe rash involving mucus membranes or skin necrosis: Yes Has patient had a PCN reaction that required hospitalization No Has patient had a PCN reaction occurring within the last 10 years: No If all of the above answers are "NO", then may proceed with  Cephalosporin use.   [name] Lisinopril Cough  . Losartan Cough  . Metoprolol Other (See Comments)    Bradycardia.    VITALS:  Blood pressure 118/69, pulse 79, temperature 98 F (36.7 C), temperature source Oral, resp. rate 18, height _0  (1.93 m), weight 131.2 kg (289 lb 3.9 oz), SpO2 100 %.  PHYSICAL EXAMINATION:  GENERAL:  73 y.o.-year-old patient lying in the bed with no acute distress.  EYES: Pupils equal, round, reactive to light and accommodation. No scleral icterus. Extraocular muscles intact.  HEENT: Head atraumatic, normocephalic. Oropharynx and nasopharynx clear.  NECK:  Supple, no jugular venous distention. No thyroid enlargement, no tenderness.  LUNGS: Normal breath sounds bilaterally, no wheezing, rales,rhonchi or crepitation. No use of accessory muscles of respiration.  CARDIOVASCULAR: S1, S2 normal. No murmurs, rubs, or gallops.  ABDOMEN: Soft, nontender, nondistended. Bowel sounds present.  EXTREMITIES: No pedal edema, cyanosis, or clubbing.  NEUROLOGIC: Cranial nerves II through XII are intact. Muscle strength 5/5 in all extremities. Sensation intact. Gait not checked.  PSYCHIATRIC: The patient is alert and oriented x 3.  SKIN: No obvious rash, lesion, or ulcer.    LABORATORY PANEL:   CBC Recent Labs  Lab 02/24/18 0407  WBC 11.8*  HGB 8.9*  HCT 26.8*  PLT 267   ------------------------------------------------------------------------------------------------------------------  Chemistries  Recent Labs  Lab 02/22/18 0510  NA 139  K 3.9  CL 105  CO2 25  GLUCOSE 284*  BUN 15  CREATININE 0.94  CALCIUM 8.8*  AST 20  ALT 18  ALKPHOS 64  BILITOT 0.8   ------------------------------------------------------------------------------------------------------------------  Cardiac Enzymes Recent Labs  Lab 02/22/18 0510  TROPONINI <0.03    ------------------------------------------------------------------------------------------------------------------  RADIOLOGY:  Nm  Gi Blood Loss  Result Date: 02/22/2018 CLINICAL DATA:  Hematochezia. EXAM: NUCLEAR MEDICINE GASTROINTESTINAL BLEEDING SCAN TECHNIQUE: Sequential abdominal images were obtained following intravenous administration of Tc-55mlabeled red blood cells. Imaging was performed for 2 hr. RADIOPHARMACEUTICALS:  22.8 mCi Tc-945mertechnetate in-vitro labeled red cells. COMPARISON:  12/27/2016 FINDINGS: No abnormal radiopharmaceutical activity seen within the small or large bowel. Radiopharmaceutical activity matching blood pool is seen in the region of the genitalia, which is confirmed on the lateral projection and is unchanged compared to previous study. IMPRESSION: No evidence of active GI bleeding. Electronically Signed   By: JoEarle Gell.D.   On: 02/22/2018 17:00    EKG:   Orders placed or performed during the hospital encounter of 02/22/18  . EKG 12-Lead  . EKG 12-Lead  . EKG 12-Lead  . EKG 12-Lead  . EKG 12-Lead  . EKG 12-Lead    ASSESSMENT AND PLAN:   This is a [age]ear old male admitted for near syncope.  1.  Near syncope: Secondary to GI bleeding and volume loss.   Volume resuscitate with normal saline.   Transfused 2 units of  blood Echocardiogram -pending  Follow-up with GI  2.  Rectal bleeding: Diverticular versus AVM bleed  Recent workup negative.  Patient had  EGD 2/24, nml.  For colonoscopy today Transfused 2 units of blood and keeping 2 units ready for future needs Monitor hemoglobin hematocrit closely.  Hemoglobin at 9.7-8.9 Tagged red blood cell scan is negative CT angiogram of the abdomen ordered as recommended by gastroenterology PPI-2 times a day IV fluids Avoid NSAIDs stopped aspirin Follow-up with gastroenterology   Cipro and Flagyl for diverticulosis- Sepsis from diverticulosis patient met criteria at the time of admission with  hypotension, elevated lactic acid and leukocytosis  3.  Hypertension: Controlled; resume antihypertensive medications with the patient's blood pressure is normalized.  4.  Diabetes mellitus type 2: Elevated blood sugars likely secondary to diverticular infection.  Sliding scale insulin while hospitalized.  Hold oral hypoglycemic agents.  5.  History of atrial fibrillation: The patient is rate controlled and not currently on anticoagulation except aspirin.  Continue to monitor telemetry.  6.  CHF: Presumably when the patient had uncontrolled atrial fibrillation.  I have reviewed his records and he does not take diuretic therapy nor antiarrhythmics or digoxin.  Monitor for signs or symptoms of heart failure.  7.  DVT prophylaxis: SCDs       All the records are reviewed and case discussed with Care Management/Social Workerr. Management plans discussed with the patient, wife at bedside and they are in agreement.  CODE STATUS: fc   TOTAL TIME TAKING CARE OF THIS PATIENT: 36 minutes.   POSSIBLE D/C IN 2 DAYS, DEPENDING ON CLINICAL CONDITION.  Note: This dictation was prepared with Dragon dictation along with smaller phrase technology. Any transcriptional errors that result from this process are unintentional.   ArNicholes Mango.D on 02/24/2018 at 8:59 AM  Between 7am to 6pm - Pager - [phone] 6pm go to [email or web address] - password [email or web address] [facility]  Office  [phone]: Primary care physician; ThEzequiel KayserMD

## 2018-02-24 NOTE — Anesthesia Preprocedure Evaluation (Signed)
Anesthesia Evaluation  Patient identified by MRN, date of birth, ID band Patient awake    Reviewed: Allergy & Precautions, NPO status , Patient's Chart, lab work & pertinent test results, reviewed documented beta blocker date and time   Airway Mallampati: III  TM Distance: >3 FB     Dental  (+) Chipped   Pulmonary sleep apnea and Continuous Positive Airway Pressure Ventilation , COPD, former smoker,           Cardiovascular hypertension, Pt. on medications + Peripheral Vascular Disease and +CHF  + dysrhythmias Atrial Fibrillation      Neuro/Psych    GI/Hepatic   Endo/Other  diabetes, Type 2  Renal/GU Renal disease     Musculoskeletal  (+) Arthritis ,   Abdominal   Peds  Hematology  (+) anemia ,   Anesthesia Other Findings Has had ablation for AF. Continues to have PVCs. His rate has decreased after receiving blood. Hb 5.6 this am before blood.  Reproductive/Obstetrics                             Anesthesia Physical  Anesthesia Plan  ASA: III  Anesthesia Plan: General   Post-op Pain Management:    Induction: Intravenous  PONV Risk Score and Plan:   Airway Management Planned:   Additional Equipment:   Intra-op Plan:   Post-operative Plan:   Informed Consent: I have reviewed the patients History and Physical, chart, labs and discussed the procedure including the risks, benefits and alternatives for the proposed anesthesia with the patient or authorized representative who has indicated his/her understanding and acceptance.     Plan Discussed with: CRNA  Anesthesia Plan Comments:         Anesthesia Quick Evaluation

## 2018-02-24 NOTE — Anesthesia Post-op Follow-up Note (Signed)
Anesthesia QCDR form completed.        

## 2018-02-24 NOTE — Anesthesia Postprocedure Evaluation (Signed)
Anesthesia Post Note  Patient: Evan [name]  Procedure(s) Performed: COLONOSCOPY (Left )  Patient location during evaluation: Endoscopy Anesthesia Type: General Level of consciousness: awake and alert Pain management: pain level controlled Vital Signs Assessment: post-procedure vital signs reviewed and stable Respiratory status: spontaneous breathing and respiratory function stable Cardiovascular status: stable Anesthetic complications: no     Last Vitals:  Vitals:   02/24/18 1408 02/24/18 1435  BP: 109/76 138/75  Pulse: 87 83  Resp: 15 20  Temp:    SpO2: 100% 96%    Last Pain:  Vitals:   02/24/18 1405  TempSrc: Tympanic                 KEPHART,Donielle K

## 2018-02-24 NOTE — Transfer of Care (Signed)
Immediate Anesthesia Transfer of Care Note  Patient: Evan [name]  Procedure(s) Performed: COLONOSCOPY (Left )  Patient Location: PACU  Anesthesia Type:General  Level of Consciousness: sedated  Airway & Oxygen Therapy: Patient Spontanous Breathing and Patient connected to nasal cannula oxygen  Post-op Assessment: Report given to RN and Post -op Vital signs reviewed and stable  Post vital signs: Reviewed and stable  Last Vitals:  Vitals:   02/24/18 1405 02/24/18 1408  BP: (!) 87/59 109/76  Pulse:  87  Resp:  15  Temp: 36.5 C   SpO2:  100%    Last Pain:  Vitals:   02/24/18 1405  TempSrc: Tympanic         Complications: No apparent anesthesia complications

## 2018-02-25 ENCOUNTER — Encounter: Payer: Self-pay | Admitting: Gastroenterology

## 2018-02-25 LAB — CBC
HEMATOCRIT: 27.6 % — AB (ref 40.0–52.0)
Hemoglobin: 9.4 g/dL — ABNORMAL LOW (ref 13.0–18.0)
MCH: 29.6 pg (ref 26.0–34.0)
MCHC: 33.9 g/dL (ref 32.0–36.0)
MCV: 87.1 fL (ref 80.0–100.0)
PLATELETS: 272 10*3/uL (ref 150–440)
RBC: 3.17 MIL/uL — ABNORMAL LOW (ref 4.40–5.90)
RDW: 15.4 % — AB (ref 11.5–14.5)
WBC: 10.7 10*3/uL — AB (ref 3.8–10.6)

## 2018-02-25 LAB — HEMOGLOBIN AND HEMATOCRIT, BLOOD
HEMATOCRIT: 25.8 % — AB (ref 40.0–52.0)
HEMOGLOBIN: 8.4 g/dL — AB (ref 13.0–18.0)

## 2018-02-25 LAB — GLUCOSE, CAPILLARY
Glucose-Capillary: 128 mg/dL — ABNORMAL HIGH (ref 65–99)
Glucose-Capillary: 142 mg/dL — ABNORMAL HIGH (ref 65–99)
Glucose-Capillary: 178 mg/dL — ABNORMAL HIGH (ref 65–99)

## 2018-02-25 MED ORDER — POLYETHYLENE GLYCOL 3350 17 G PO PACK
17.0000 g | PACK | Freq: Every day | ORAL | Status: DC
  Administered 2018-02-25: 17 g via ORAL
  Filled 2018-02-25: qty 1

## 2018-02-25 MED ORDER — PANTOPRAZOLE SODIUM 40 MG PO TBEC: 40.0000 mg | DELAYED_RELEASE_TABLET | Freq: Every day | ORAL | 0 refills | Status: DC

## 2018-02-25 MED ORDER — POLYETHYLENE GLYCOL 3350 17 G PO PACK: 17.0000 g | PACK | Freq: Every day | ORAL | 0 refills | Status: AC

## 2018-02-25 MED ORDER — PANTOPRAZOLE SODIUM 40 MG PO TBEC
40.0000 mg | DELAYED_RELEASE_TABLET | Freq: Every day | ORAL | Status: DC
  Administered 2018-02-25: 40 mg via ORAL
  Filled 2018-02-25: qty 1

## 2018-02-25 MED ORDER — DOCUSATE CALCIUM 240 MG PO CAPS: 240.0000 mg | ORAL_CAPSULE | Freq: Two times a day (BID) | ORAL | 0 refills | Status: DC | PRN

## 2018-02-25 NOTE — Discharge Instructions (Signed)
° °  Gastrointestinal Bleeding Gastrointestinal bleeding is bleeding somewhere along the path food travels through the body (digestive tract). This path is anywhere between the mouth and the opening of the butt (anus). You may have blood in your poop (stools) or have black poop. If you throw up (vomit), there may be blood in it. This condition can be mild, serious, or even life-threatening. If you have a lot of bleeding, you may need to stay in the hospital. Follow these instructions at home:  Take over-the-counter and prescription medicines only as told by your doctor.  Eat foods that have a lot of fiber in them. These foods include whole grains, fruits, and vegetables. You can also try eating 1-3 prunes each day.  Drink enough fluid to keep your pee (urine) clear or pale yellow.  Keep all follow-up visits as told by your doctor. This is important. Contact a doctor if:  Your symptoms do not get better. Get help right away if:  Your bleeding gets worse.  You feel dizzy or you pass out (faint).  You feel weak.  You have very bad cramps in your back or belly (abdomen).  You pass large clumps of blood (clots) in your poop.  Your symptoms are getting worse. This information is not intended to replace advice given to you by your health care provider. Make sure you discuss any questions you have with your health care provider. Document Released: 09/25/2008 Document Revised: 05/24/2016 Document Reviewed: 06/06/2015 Elsevier Interactive Patient Education  2018 [place] with primary care physician as scheduled in 2 days and PCP to consider repeating CBC during the follow-up visit as the patient is traveling out of country in 2 weeks Follow-up with gastroenterology in 4-6 weeks or sooner as needed

## 2018-02-25 NOTE — Progress Notes (Signed)
Evan [name]  A and O x 4. VSS. Pt tolerating diet well. No complaints of pain or nausea. IV removed intact, prescriptions given. Pt voiced understanding of discharge instructions with no further questions. Pt discharged via wheelchair with RN student.  Lynann Bologna MSN, RN-BC  Allergies as of 02/25/2018      Reactions   Azithromycin Rash   Penicillins Rash   Has patient had a PCN reaction causing immediate rash, facial/tongue/throat swelling, SOB or lightheadedness with hypotension: Yes Has patient had a PCN reaction causing severe rash involving mucus membranes or skin necrosis: Yes Has patient had a PCN reaction that required hospitalization No Has patient had a PCN reaction occurring within the last 10 years: No If all of the above answers are "NO", then may proceed with Cephalosporin use. Has patient had a PCN reaction causing immediate rash, facial/tongue/throat swelling, SOB or lightheadedness with hypotension: Yes Has patient had a PCN reaction causing severe rash involving mucus membranes or skin necrosis: Yes Has patient had a PCN reaction that required hospitalization No Has patient had a PCN reaction occurring within the last 10 years: No If all of the above answers are "NO", then may proceed with Cephalosporin use.   Lisinopril Cough   Losartan Cough   Metoprolol Other (See Comments)   Bradycardia.      Medication List    TAKE these medications   acetaminophen 500 MG tablet Commonly known as:  TYLENOL Take 1,000 mg by mouth every 6 (six) hours as needed.   amLODipine 5 MG tablet Commonly known as:  NORVASC Take 5 mg by mouth daily.   aspirin 81 MG EC tablet Take 1 tablet (81 mg total) by mouth daily.   CENTRUM SILVER PO Take by mouth every morning.   docusate calcium 240 MG capsule Commonly known as:  SURFAK Take 1 capsule (240 mg total) by mouth 2 (two) times daily as needed for mild constipation. What changed:    when to take this  reasons to take  this   dorzolamide-timolol 22.3-6.8 MG/ML ophthalmic solution Commonly known as:  COSOPT Apply 1 drop to eye 2 (two) times daily.   ferrous gluconate 324 MG tablet Commonly known as:  FERGON Take 1 tablet (324 mg total) by mouth 2 (two) times daily with a meal.   Fish Oil 1200 MG Caps Take 2 capsules by mouth daily.   latanoprost 0.005 % ophthalmic solution Commonly known as:  XALATAN Apply 1 drop to eye at bedtime.   metFORMIN 500 MG 24 hr tablet Commonly known as:  GLUCOPHAGE-XR 1,000 mg daily with breakfast.   pantoprazole 40 MG tablet Commonly known as:  PROTONIX Take 1 tablet (40 mg total) by mouth daily.   polyethylene glycol packet Commonly known as:  MIRALAX / GLYCOLAX Take 17 g by mouth daily.   pravastatin 40 MG tablet Commonly known as:  PRAVACHOL Take 40 mg by mouth daily.   SHINGRIX injection Generic drug:  Zoster Vaccine Adjuvanted   Vitamin D3 2000 units capsule Take 2,000 Units by mouth daily.       Vitals:   02/24/18 2011 02/25/18 0620  BP: 125/82 128/72  Pulse: 87 87  Resp: 19 18  Temp: 98.2 F (36.8 C) 98.2 F (36.8 C)  SpO2: 99% 100%

## 2018-02-25 NOTE — Discharge Summary (Signed)
[facility] NAME: Evan [name]    MR#:  [id number]  DATE OF BIRTH:  1945/10/22  DATE OF ADMISSION:  02/22/2018 ADMITTING PHYSICIAN: Harrie Foreman, MD  DATE OF DISCHARGE: 02/25/18 PRIMARY CARE PHYSICIAN: Ezequiel Kayser, MD    ADMISSION DIAGNOSIS:  Hematochezia [K92.1] Rectal bleeding [K62.5] Near syncope [R55]  DISCHARGE DIAGNOSIS:  Active Problems:   Near syncope   GIB (gastrointestinal bleeding)   Diverticulosis of large intestine without diverticulitis   SECONDARY DIAGNOSIS:   Past Medical History:  Diagnosis Date  . Adenoma of colon   . Arthritis    xDJD  . Atrial fibrillation ([place])   . BPH (benign prostatic hyperplasia)   . CHF (congestive heart failure) ([place])   . Diabetes mellitus without complication ([place])   . Discitis   . Diverticulitis   . Diverticulosis   . DVT (deep venous thrombosis) ([place])   . Erectile dysfunction   . Hearing loss   . Hyperlipidemia   . Hypertension   . Ischemic optic neuropathy   . Lower GI bleeding   . Non-ischemic cardiomyopathy ([place])   . Sleep apnea     HOSPITAL COURSE:  HPI: The patient with past medical history of rectal bleeding, atrial fibrillation, hypertension and diabetes presents to the emergency department after an episode of lightheadedness that followed significant rectal bleeding.  The patient was just admitted to the hospital for a rectal bleed but endoscopy did not reveal source.  His hemoglobin has dropped 1 g since that time.  The patient is currently asymptomatic and did not actually lose consciousness but did become weak and collapsed.  Due to recurrence of GI bleeding as well as comorbidities the emergency department staff asked the hospitalist service for further evaluation.   1. Near syncope: Secondary to GI bleeding and volume loss.  Volume resuscitate with normal saline.  Transfused 2 units of  blood Echocardiogram-55-60% ejection fraction   Follow-up with GI as an outpatient  2. Rectal bleeding: Diverticular versus AVM bleed  Recent workup negative.  Patient had  EGD 2/24, nml.   Colonoscopy on 02/24/2018 has revealed diverticulosis but no active bleeding.  GI has recommended MiraLAX and diverticular diet and okay to discharge patient from GI standpoint and okay to discontinue antibiotics as patient does not have diverticulitis Transfused 2 units of blood and keeping 2 units ready for future needs Monitor hemoglobin hematocrit closely.  Hemoglobin at 9.7-8.9-9.4 Tagged red blood cell scan is negative PPI-2 times a day IV fluids Avoid NSAIDs  Okay to resume aspirin as patient is not actively bleeding Follow-up with gastroenterologyas an outpatient  Cipro and Flagyl for diverticulosis-but blood cultures were negative and okay to discontinue antibiotics from GI standpoint Sepsis from diverticulosis patient met criteria at the time of admission with hypotension, elevated lactic acid and leukocytosis, eventually sepsis ruled out with negative blood cultures and antibiotics discontinued  3. Hypertension: Controlled; resume antihypertensive medications with the patient's blood pressure is normalized.  4. Diabetes mellitus type 2: Elevated blood sugars likely secondary to diverticular infection. Sliding scale insulin while hospitalized. Hold oral hypoglycemic agents.  5. History of atrial fibrillation: The patient is rate controlled and not currently on anticoagulation except aspirin. Continue to monitor telemetry.  6. CHF: Presumably when the patient had uncontrolled atrial fibrillation. I have reviewed his records and he does not take diuretic therapy nor antiarrhythmics or digoxin. Monitor for signs or symptoms of heart failure.  7. DVT prophylaxis: SCDs  DISCHARGE CONDITIONS:   stable  CONSULTS OBTAINED:  Treatment Team:  Virgel Manifold, MD Yolonda Kida, MD   PROCEDURES EGD and  colonoscopy  DRUG ALLERGIES:   Allergies  Allergen Reactions  . Azithromycin Rash  . Penicillins Rash    Has patient had a PCN reaction causing immediate rash, facial/tongue/throat swelling, SOB or lightheadedness with hypotension: Yes Has patient had a PCN reaction causing severe rash involving mucus membranes or skin necrosis: Yes Has patient had a PCN reaction that required hospitalization No Has patient had a PCN reaction occurring within the last 10 years: No If all of the above answers are "NO", then may proceed with Cephalosporin use. Has patient had a PCN reaction causing immediate rash, facial/tongue/throat swelling, SOB or lightheadedness with hypotension: Yes Has patient had a PCN reaction causing severe rash involving mucus membranes or skin necrosis: Yes Has patient had a PCN reaction that required hospitalization No Has patient had a PCN reaction occurring within the last 10 years: No If all of the above answers are "NO", then may proceed with Cephalosporin use.   [name] Lisinopril Cough  . Losartan Cough  . Metoprolol Other (See Comments)    Bradycardia.    DISCHARGE MEDICATIONS:   Allergies as of 02/25/2018      Reactions   Azithromycin Rash   Penicillins Rash   Has patient had a PCN reaction causing immediate rash, facial/tongue/throat swelling, SOB or lightheadedness with hypotension: Yes Has patient had a PCN reaction causing severe rash involving mucus membranes or skin necrosis: Yes Has patient had a PCN reaction that required hospitalization No Has patient had a PCN reaction occurring within the last 10 years: No If all of the above answers are "NO", then may proceed with Cephalosporin use. Has patient had a PCN reaction causing immediate rash, facial/tongue/throat swelling, SOB or lightheadedness with hypotension: Yes Has patient had a PCN reaction causing severe rash involving mucus membranes or skin necrosis: Yes Has patient had a PCN reaction that required  hospitalization No Has patient had a PCN reaction occurring within the last 10 years: No If all of the above answers are "NO", then may proceed with Cephalosporin use.   Lisinopril Cough   Losartan Cough   Metoprolol Other (See Comments)   Bradycardia.      Medication List    TAKE these medications   acetaminophen 500 MG tablet Commonly known as:  TYLENOL Take 1,000 mg by mouth every 6 (six) hours as needed.   amLODipine 5 MG tablet Commonly known as:  NORVASC Take 5 mg by mouth daily.   aspirin 81 MG EC tablet Take 1 tablet (81 mg total) by mouth daily.   CENTRUM SILVER PO Take by mouth every morning.   docusate calcium 240 MG capsule Commonly known as:  SURFAK Take 1 capsule (240 mg total) by mouth 2 (two) times daily as needed for mild constipation. What changed:    when to take this  reasons to take this   dorzolamide-timolol 22.3-6.8 MG/ML ophthalmic solution Commonly known as:  COSOPT Apply 1 drop to eye 2 (two) times daily.   ferrous gluconate 324 MG tablet Commonly known as:  FERGON Take 1 tablet (324 mg total) by mouth 2 (two) times daily with a meal.   Fish Oil 1200 MG Caps Take 2 capsules by mouth daily.   latanoprost 0.005 % ophthalmic solution Commonly known as:  XALATAN Apply 1 drop to eye at bedtime.   metFORMIN 500 MG 24 hr tablet  Commonly known as:  GLUCOPHAGE-XR 1,000 mg daily with breakfast.   pantoprazole 40 MG tablet Commonly known as:  PROTONIX Take 1 tablet (40 mg total) by mouth daily.   polyethylene glycol packet Commonly known as:  MIRALAX / GLYCOLAX Take 17 g by mouth daily.   pravastatin 40 MG tablet Commonly known as:  PRAVACHOL Take 40 mg by mouth daily.   SHINGRIX injection Generic drug:  Zoster Vaccine Adjuvanted   Vitamin D3 2000 units capsule Take 2,000 Units by mouth daily.        DISCHARGE INSTRUCTIONS:   Follow-up with primary care physician as scheduled in 2 days and PCP to consider repeating CBC  during the follow-up visit as the patient is traveling out of country in 2 weeks Follow-up with gastroenterology in 4-6 weeks or sooner as needed  DIET:  Cardiac diet and Diabetic diet; diverticular diet  DISCHARGE CONDITION:  Stable  ACTIVITY:  Activity as tolerated  OXYGEN:  Home Oxygen: No.   Oxygen Delivery: room air  DISCHARGE LOCATION:  home   If you experience worsening of your admission symptoms, develop shortness of breath, life threatening emergency, suicidal or homicidal thoughts you must seek medical attention immediately by calling 911 or calling your MD immediately  if symptoms less severe.  You Must read complete instructions/literature along with all the possible adverse reactions/side effects for all the Medicines you take and that have been prescribed to you. Take any new Medicines after you have completely understood and accpet all the possible adverse reactions/side effects.   Please note  You were cared for by a hospitalist during your hospital stay. If you have any questions about your discharge medications or the care you received while you were in the hospital after you are discharged, you can call the unit and asked to speak with the hospitalist on call if the hospitalist that took care of you is not available. Once you are discharged, your primary care physician will handle any further medical issues. Please note that NO REFILLS for any discharge medications will be authorized once you are discharged, as it is imperative that you return to your primary care physician (or establish a relationship with a primary care physician if you do not have one) for your aftercare needs so that they can reassess your need for medications and monitor your lab values.     Today  Chief Complaint  Patient presents with  . Rectal Bleeding  . Diverticulosis   Patient is resting comfortably.  Denies any abdominal pain or chest pain or shortness of breath.  Denies any dizzy  spells either.  Wife at bedside.  Okay to discharge patient from GI standpoint  ROS:  CONSTITUTIONAL: Denies fevers, chills. Denies any fatigue, weakness.  EYES: Denies blurry vision, double vision, eye pain. EARS, NOSE, THROAT: Denies tinnitus, ear pain, hearing loss. RESPIRATORY: Denies cough, wheeze, shortness of breath.  CARDIOVASCULAR: Denies chest pain, palpitations, edema.  GASTROINTESTINAL: Denies nausea, vomiting, diarrhea, abdominal pain. Denies bright red blood per rectum. GENITOURINARY: Denies dysuria, hematuria. ENDOCRINE: Denies nocturia or thyroid problems. HEMATOLOGIC AND LYMPHATIC: Denies easy bruising or bleeding. SKIN: Denies rash or lesion. MUSCULOSKELETAL: Denies pain in neck, back, shoulder, knees, hips or arthritic symptoms.  NEUROLOGIC: Denies paralysis, paresthesias.  PSYCHIATRIC: Denies anxiety or depressive symptoms.   VITAL SIGNS:  Blood pressure 128/72, pulse 87, temperature 98.2 F (36.8 C), temperature source Oral, resp. rate 18, height _0  (1.93 m), weight 133.1 kg (293 lb 8 oz), SpO2 100 %.  I/O:    Intake/Output Summary (Last 24 hours) at 02/25/2018 1304 Last data filed at 02/25/2018 1215 Gross per 24 hour  Intake 3258 ml  Output 1750 ml  Net 1508 ml    PHYSICAL EXAMINATION:  GENERAL:  73 y.o.-year-old patient lying in the bed with no acute distress.  EYES: Pupils equal, round, reactive to light and accommodation. No scleral icterus. Extraocular muscles intact.  HEENT: Head atraumatic, normocephalic. Oropharynx and nasopharynx clear.  NECK:  Supple, no jugular venous distention. No thyroid enlargement, no tenderness.  LUNGS: Normal breath sounds bilaterally, no wheezing, rales,rhonchi or crepitation. No use of accessory muscles of respiration.  CARDIOVASCULAR: S1, S2 normal. No murmurs, rubs, or gallops.  ABDOMEN: Soft, non-tender, non-distended. Bowel sounds present. No organomegaly or mass.  EXTREMITIES: No pedal edema, cyanosis, or  clubbing.  NEUROLOGIC: Cranial nerves II through XII are intact. Muscle strength 5/5 in all extremities. Sensation intact. Gait not checked.  PSYCHIATRIC: The patient is alert and oriented x 3.  SKIN: No obvious rash, lesion, or ulcer.   DATA REVIEW:   CBC Recent Labs  Lab 02/25/18 0744  WBC 10.7*  HGB 9.4*  HCT 27.6*  PLT 272    Chemistries  Recent Labs  Lab 02/22/18 0510  NA 139  K 3.9  CL 105  CO2 25  GLUCOSE 284*  BUN 15  CREATININE 0.94  CALCIUM 8.8*  AST 20  ALT 18  ALKPHOS 64  BILITOT 0.8    Cardiac Enzymes Recent Labs  Lab 02/22/18 0510  TROPONINI <0.03    Microbiology Results  Results for orders placed or performed during the hospital encounter of 01/07/17  CULTURE, BLOOD (ROUTINE X 2) w Reflex to ID Panel     Status: None   Collection Time: 01/08/17  1:41 PM  Result Value Ref Range Status   Specimen Description BLOOD RIGHT AC  Final   Special Requests   Final    BOTTLES DRAWN AEROBIC AND ANAEROBIC AER 4ML ANA 5ML   Culture NO GROWTH 5 DAYS  Final   Report Status 01/13/2017 FINAL  Final  CULTURE, BLOOD (ROUTINE X 2) w Reflex to ID Panel     Status: None   Collection Time: 01/08/17  1:54 PM  Result Value Ref Range Status   Specimen Description BLOOD RIGHT HAND  Final   Special Requests   Final    BOTTLES DRAWN AEROBIC AND ANAEROBIC AER 9ML ANA 10ML   Culture NO GROWTH 5 DAYS  Final   Report Status 01/13/2017 FINAL  Final    RADIOLOGY:  Nm Gi Blood Loss  Result Date: 02/22/2018 CLINICAL DATA:  Hematochezia. EXAM: NUCLEAR MEDICINE GASTROINTESTINAL BLEEDING SCAN TECHNIQUE: Sequential abdominal images were obtained following intravenous administration of Tc-54mlabeled red blood cells. Imaging was performed for 2 hr. RADIOPHARMACEUTICALS:  22.8 mCi Tc-959mertechnetate in-vitro labeled red cells. COMPARISON:  12/27/2016 FINDINGS: No abnormal radiopharmaceutical activity seen within the small or large bowel. Radiopharmaceutical activity matching  blood pool is seen in the region of the genitalia, which is confirmed on the lateral projection and is unchanged compared to previous study. IMPRESSION: No evidence of active GI bleeding. Electronically Signed   By: JoEarle Gell.D.   On: 02/22/2018 17:00    EKG:   Orders placed or performed during the hospital encounter of 02/22/18  . EKG 12-Lead  . EKG 12-Lead  . EKG 12-Lead  . EKG 12-Lead  . EKG 12-Lead  . EKG 12-Lead      Management plans discussed  with the patient, family and they are in agreement.  CODE STATUS:     Code Status Orders  (From admission, onward)        Start     Ordered   02/22/18 0829  Full code  Continuous     02/22/18 0828    Code Status History    Date Active Date Inactive Code Status Order ID Comments User Context   02/16/2018 22:55 02/19/2018 17:40 Full Code [id number]  Gorden Harms, MD Inpatient   01/08/2017 03:14 01/15/2017 21:16 Full Code [id number]  Florene Glen, MD Inpatient   12/27/2016 11:54 01/06/2017 17:26 Full Code [id number]  Hillary Bow, MD ED      TOTAL TIME TAKING CARE OF THIS PATIENT: 43 minutes.   Note: This dictation was prepared with Dragon dictation along with smaller phrase technology. Any transcriptional errors that result from this process are unintentional.   _0 @  on 02/25/2018 at 1:04 PM  Between 7am to 6pm - Pager - [phone]  After 6pm go to [email or web address] - password [email or web address] [facility]  [facility] [place]  Office  [phone]  CC: Primary care physician; Ezequiel Kayser, MD

## 2018-02-25 NOTE — Care Management Important Message (Signed)
Important Message  Patient Details  Name: Champ Keetch MRN: [id number] Date of Birth: Feb 11, 1945   Medicare Important Message Given:  N/A - LOS <3 / Initial given by admissions    Beverly Sessions, RN 02/25/2018, 10:28 AM

## 2018-02-25 NOTE — Progress Notes (Signed)
Nutrition Education Note  RD consulted for nutrition education regarding a low fiber diet for diverticulitis.  RD provided "Nutrition and Diverticulitis" handout with supporting information. Reviewed patient's dietary recall. Provided examples of low and high fiber foods. Discouraged intake of high fiber foods, high fat foods, spicy foods, processed foods, caffeine and red meats when having a flare. Encouraged pt to cook foods until they are soft and chew foods well to help aid in digestion. Also recommend frequent small meals. Encouraged use of a multi-vitamin and protein supplements while having a flare.    RD encourage intake of high fiber foods when not having a flare.   Expect good compliance.  Body mass index is 35.73 kg/m. Pt meets criteria for obesity based on current BMI.  Current diet order is HH/CHO, patient is consuming approximately 100% of meals at this time. Labs and medications reviewed. No further nutrition interventions warranted at this time. RD contact information provided. If additional nutrition issues arise, please re-consult RD.  Koleen Distance MS, RD, LDN Pager #- [phone] After Hours Pager: [phone]

## 2018-02-26 ENCOUNTER — Telehealth: Payer: Self-pay

## 2018-02-26 NOTE — Telephone Encounter (Signed)
EMMI Follow-up: Called Evan [name] as it was noted on the report he had unfilled Rxs.  He said his prescriptions had been filled and he had called to find out what building Dr. Chuck Hint was in for his March 7 appt. No other concerns at this time.

## 2018-02-27 DIAGNOSIS — F5104 Psychophysiologic insomnia: Secondary | ICD-10-CM | POA: Insufficient documentation

## 2018-03-06 ENCOUNTER — Ambulatory Visit: Payer: Medicare HMO | Admitting: Gastroenterology

## 2018-03-06 ENCOUNTER — Other Ambulatory Visit: Payer: Self-pay

## 2018-03-06 ENCOUNTER — Encounter: Payer: Self-pay | Admitting: Gastroenterology

## 2018-03-06 VITALS — BP 125/72 | HR 78 | Temp 97.4°F | Ht 76.0 in | Wt 284.6 lb

## 2018-03-06 DIAGNOSIS — D508 Other iron deficiency anemias: Secondary | ICD-10-CM

## 2018-03-06 DIAGNOSIS — K5731 Diverticulosis of large intestine without perforation or abscess with bleeding: Secondary | ICD-10-CM

## 2018-03-06 NOTE — Patient Instructions (Addendum)
F/U 3 months Stop Protonix Stop Surfak Miralax 2x (twice) daily  High-Fiber Diet Fiber, also called dietary fiber, is a type of carbohydrate found in fruits, vegetables, whole grains, and beans. A high-fiber diet can have many health benefits. Your health care provider may recommend a high-fiber diet to help:  Prevent constipation. Fiber can make your bowel movements more regular.  Lower your cholesterol.  Relieve hemorrhoids, uncomplicated diverticulosis, or irritable bowel syndrome.  Prevent overeating as part of a weight-loss plan.  Prevent heart disease, type 2 diabetes, and certain cancers.  What is my plan? The recommended daily intake of fiber includes:  38 grams for men under age 65.  77 grams for men over age 35.  65 grams for women under age 74.  25 grams for women over age 34.  You can get the recommended daily intake of dietary fiber by eating a variety of fruits, vegetables, grains, and beans. Your health care provider may also recommend a fiber supplement if it is not possible to get enough fiber through your diet. What do I need to know about a high-fiber diet?  Fiber supplements have not been widely studied for their effectiveness, so it is better to get fiber through food sources.  Always check the fiber content on thenutrition facts label of any prepackaged food. Look for foods that contain at least 5 grams of fiber per serving.  Ask your dietitian if you have questions about specific foods that are related to your condition, especially if those foods are not listed in the following section.  Increase your daily fiber consumption gradually. Increasing your intake of dietary fiber too quickly may cause bloating, cramping, or gas.  Drink plenty of water. Water helps you to digest fiber. What foods can I eat? Grains Whole-grain breads. Multigrain cereal. Oats and oatmeal. Brown rice. Barley. Bulgur wheat. Macy. Bran muffins. Popcorn. Rye wafer  crackers. Vegetables Sweet potatoes. Spinach. Kale. Artichokes. Cabbage. Broccoli. Green peas. Carrots. Squash. Fruits Berries. Pears. Apples. Oranges. Avocados. Prunes and raisins. Dried figs. Meats and Other Protein Sources Navy, kidney, pinto, and soy beans. Split peas. Lentils. Nuts and seeds. Dairy Fiber-fortified yogurt. Beverages Fiber-fortified soy milk. Fiber-fortified orange juice. Other Fiber bars. The items listed above may not be a complete list of recommended foods or beverages. Contact your dietitian for more options. What foods are not recommended? Grains White bread. Pasta made with refined flour. White rice. Vegetables Fried potatoes. Canned vegetables. Well-cooked vegetables. Fruits Fruit juice. Cooked, strained fruit. Meats and Other Protein Sources Fatty cuts of meat. Fried Sales executive or fried fish. Dairy Milk. Yogurt. Cream cheese. Sour cream. Beverages Soft drinks. Other Cakes and pastries. Butter and oils. The items listed above may not be a complete list of foods and beverages to avoid. Contact your dietitian for more information. What are some tips for including high-fiber foods in my diet?  Eat a wide variety of high-fiber foods.  Make sure that half of all grains consumed each day are whole grains.  Replace breads and cereals made from refined flour or white flour with whole-grain breads and cereals.  Replace white rice with brown rice, bulgur wheat, or millet.  Start the day with a breakfast that is high in fiber, such as a cereal that contains at least 5 grams of fiber per serving.  Use beans in place of meat in soups, salads, or pasta.  Eat high-fiber snacks, such as berries, raw vegetables, nuts, or popcorn. This information is not intended to replace advice given  to you by your health care provider. Make sure you discuss any questions you have with your health care provider. Document Released: 12/17/2005 Document Revised: 05/24/2016 Document  Reviewed: 06/01/2014 Elsevier Interactive Patient Education  [employer].

## 2018-03-07 ENCOUNTER — Inpatient Hospital Stay: Payer: Medicare HMO | Attending: Oncology | Admitting: Oncology

## 2018-03-07 ENCOUNTER — Inpatient Hospital Stay: Payer: Medicare HMO

## 2018-03-07 ENCOUNTER — Other Ambulatory Visit: Payer: Self-pay

## 2018-03-07 ENCOUNTER — Encounter: Payer: Self-pay | Admitting: Oncology

## 2018-03-07 DIAGNOSIS — D500 Iron deficiency anemia secondary to blood loss (chronic): Secondary | ICD-10-CM | POA: Diagnosis not present

## 2018-03-07 DIAGNOSIS — D509 Iron deficiency anemia, unspecified: Secondary | ICD-10-CM | POA: Insufficient documentation

## 2018-03-07 DIAGNOSIS — K5791 Diverticulosis of intestine, part unspecified, without perforation or abscess with bleeding: Secondary | ICD-10-CM

## 2018-03-07 DIAGNOSIS — I10 Essential (primary) hypertension: Secondary | ICD-10-CM | POA: Insufficient documentation

## 2018-03-07 DIAGNOSIS — E119 Type 2 diabetes mellitus without complications: Secondary | ICD-10-CM | POA: Insufficient documentation

## 2018-03-07 DIAGNOSIS — Z79899 Other long term (current) drug therapy: Secondary | ICD-10-CM | POA: Diagnosis not present

## 2018-03-07 DIAGNOSIS — Z8719 Personal history of other diseases of the digestive system: Secondary | ICD-10-CM

## 2018-03-07 DIAGNOSIS — Z86718 Personal history of other venous thrombosis and embolism: Secondary | ICD-10-CM | POA: Insufficient documentation

## 2018-03-07 LAB — URINALYSIS, COMPLETE (UACMP) WITH MICROSCOPIC
BILIRUBIN URINE: NEGATIVE
Bacteria, UA: NONE SEEN
GLUCOSE, UA: 50 mg/dL — AB
Hgb urine dipstick: NEGATIVE
KETONES UR: 5 mg/dL — AB
NITRITE: NEGATIVE
PH: 5 (ref 5.0–8.0)
Protein, ur: 30 mg/dL — AB
SPECIFIC GRAVITY, URINE: 1.024 (ref 1.005–1.030)
Squamous Epithelial / LPF: NONE SEEN

## 2018-03-07 MED ORDER — SODIUM CHLORIDE 0.9 % IV SOLN
510.0000 mg | Freq: Once | INTRAVENOUS | Status: AC
  Administered 2018-03-07: 510 mg via INTRAVENOUS
  Filled 2018-03-07: qty 17

## 2018-03-07 MED ORDER — SODIUM CHLORIDE 0.9 % IV SOLN
Freq: Once | INTRAVENOUS | Status: AC
  Administered 2018-03-07: 11:00:00 via INTRAVENOUS
  Filled 2018-03-07: qty 1000

## 2018-03-07 NOTE — Progress Notes (Signed)
Hematology/Oncology Consult note [facility] Telephone:([phone] [phone]  Patient Care Team: Ezequiel Kayser, MD as PCP - General (Internal Medicine)   Name of the patient: Evan [name]  [id number]  05/11/1945    Reason for referral- iron deficiency anemia   Referring physician- Dr. Bonna Gains  Date of visit: 03/07/18   History of presenting illness-patient is a 73 year old African-American male with a prior history of diverticulosis and diverticular bleed status post partial colectomy in January 2018.  He presented with symptoms of hematochezia to the hospital in February 2019 and underwent colonoscopy by Dr. Allen Norris which showed evidence of diverticulosis but no active diverticular bleed.  There was some blood noted in the left colon.  This was followed by another colonoscopy on 02/24/2018 which again did not show any evidence of active bleeding.  Diverticulosis noted.  He also had an upper endoscopy which was unremarkable.  The cause of his GI bleed was attributed to intermittent self-limited diverticular bleeding.  Patient has not had a capsule endoscopy yet.  He has been referred to [place] for iron deficiency anemia.  Patient denies any consistent use of NSAIDs.  He has been on oral iron twice a day for the last 2 years.  He denies any fatigue or unintentional weight loss.  Denies any blood in his urine.  Patient's hemoglobin was around 7-8 in January 2018 when he had the initial episode of diverticular bleed.  In February 2019 his hemoglobin was 12.4 and had been gradually trending down to 9.  On 02/23/2018 he suddenly dropped his hemoglobin to 5.6 and required blood transfusion at that time.  Most recent CBC from 02/25/2018 showed white count of 10.7, H&H of 9.4/27.6 and a platelet count of 272.  B12 levels last checked in May 2018 was 378. Iron studies on 02/27/2018 were as follows: Ferritin was low at 22, serum iron low at 25.  TIBC was high at 450.  Patient  denies any hematochezia over the last 5-6 days  ECOG PS- 1  Pain scale- 0   Review of systems- Review of Systems  Constitutional: Negative for chills, fever, malaise/fatigue and weight loss.  HENT: Negative for congestion, ear discharge and nosebleeds.   Eyes: Negative for blurred vision.  Respiratory: Negative for cough, hemoptysis, sputum production, shortness of breath and wheezing.   Cardiovascular: Negative for chest pain, palpitations, orthopnea and claudication.  Gastrointestinal: Negative for abdominal pain, blood in stool, constipation, diarrhea, heartburn, melena, nausea and vomiting.  Genitourinary: Negative for dysuria, flank pain, frequency, hematuria and urgency.  Musculoskeletal: Negative for back pain, joint pain and myalgias.  Skin: Negative for rash.  Neurological: Negative for dizziness, tingling, focal weakness, seizures, weakness and headaches.  Endo/Heme/Allergies: Does not bruise/bleed easily.  Psychiatric/Behavioral: Negative for depression and suicidal ideas. The patient does not have insomnia.     Allergies  Allergen Reactions  . Azithromycin Rash  . Penicillins Rash    Has patient had a PCN reaction causing immediate rash, facial/tongue/throat swelling, SOB or lightheadedness with hypotension: Yes Has patient had a PCN reaction causing severe rash involving mucus membranes or skin necrosis: Yes Has patient had a PCN reaction that required hospitalization No Has patient had a PCN reaction occurring within the last 10 years: No If all of the above answers are "NO", then may proceed with Cephalosporin use. Has patient had a PCN reaction causing immediate rash, facial/tongue/throat swelling, SOB or lightheadedness with hypotension: Yes Has patient had a PCN reaction causing severe rash involving mucus  membranes or skin necrosis: Yes Has patient had a PCN reaction that required hospitalization No Has patient had a PCN reaction occurring within the last 10  years: No If all of the above answers are "NO", then may proceed with Cephalosporin use.   [name] Lisinopril Cough  . Losartan Cough  . Metoprolol Other (See Comments)    Bradycardia.    Patient Active Problem List   Diagnosis Date Noted  . Iron deficiency anemia 03/07/2018  . Diverticulosis of large intestine without diverticulitis   . GIB (gastrointestinal bleeding) 02/23/2018  . Near syncope 02/22/2018  . Hematochezia 02/16/2018  . Acute deep vein thrombosis (DVT) of popliteal vein of right lower extremity ([place]) 01/24/2017  . 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 DVT (deep venous thrombosis) ([place]) 01/22/2017  . S/P partial colectomy 01/21/2017  . Acute posthemorrhagic anemia 01/06/2017  . Encounter for blood transfusion 01/06/2017  . Leukocytosis 01/06/2017  . Hypokalemia 01/06/2017  . History of GI diverticular bleed   . BRBPR (bright red blood per rectum) 12/27/2016  . Stage 2 moderate COPD by GOLD classification ([place]) 05/03/2016  . High risk medication use 05/04/2015  . Sleep apnea 05/04/2015  . 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 Hearing loss 11/07/2014  . Hypercholesteremia 11/07/2014  . Hypertension 11/07/2014  . Lumbar degenerative disc disease 11/07/2014  . Proteinuria due to type 2 diabetes mellitus ([place]) 11/07/2014  . Arthrosis of knee 06/22/2014  . Degenerative joint disease of knee, left 05/06/2014  . Status post total right knee replacement 05/06/2014  . History of adenomatous polyp of colon 01/31/2007     Past Medical History:  Diagnosis Date  . Arthritis    xDJD  . Atrial fibrillation ([place])   . BPH (benign prostatic hyperplasia)   . CHF (congestive heart failure) ([place])   . Diabetes mellitus without complication ([place])   . Discitis   . Diverticulitis   . Diverticulosis   . DVT (deep venous thrombosis) ([place])   . Erectile dysfunction   . Hearing loss   . Hyperlipidemia   .  Hypertension   . Ischemic optic neuropathy   . Lower GI bleeding   . Non-ischemic cardiomyopathy ([place])   . Sleep apnea      Past Surgical History:  Procedure Laterality Date  . ATRIAL ABLATION SURGERY    . CHOLECYSTECTOMY    . COLONOSCOPY Left 02/24/2018   Procedure: COLONOSCOPY;  Surgeon: Virgel Manifold, MD;  Location: [facility] ENDOSCOPY;  Service: Endoscopy;  Laterality: Left;  . COLONOSCOPY WITH PROPOFOL N/A 09/25/2017   Procedure: COLONOSCOPY WITH PROPOFOL;  Surgeon: Manya Silvas, MD;  Location: [facility] ENDOSCOPY;  Service: Endoscopy;  Laterality: N/A;  . COLONOSCOPY WITH PROPOFOL N/A 02/18/2018   Procedure: COLONOSCOPY WITH PROPOFOL;  Surgeon: [name], MD;  Location: [facility] ENDOSCOPY;  Service: Endoscopy;  Laterality: N/A;  . ESOPHAGOGASTRODUODENOSCOPY Left 02/23/2018   Procedure: ESOPHAGOGASTRODUODENOSCOPY (EGD);  Surgeon: Virgel Manifold, MD;  Location: [facility] ENDOSCOPY;  Service: Endoscopy;  Laterality: Left;  . JOINT REPLACEMENT Right    knee  . LAPAROTOMY  01/01/2017   Procedure: EXPLORATORY LAPAROTOMY;  Surgeon: Olean Ree, MD;  Location: [facility] ORS;  Service: General;;  . PARTIAL COLECTOMY N/A 01/01/2017   Procedure: PARTIAL COLECTOMY;  Surgeon: Olean Ree, MD;  Location: [facility] ORS;  Service: General;  Laterality: N/A;  . PERIPHERAL VASCULAR CATHETERIZATION N/A 12/28/2016   Procedure: Visceral Artery Intervention;  Surgeon: Algernon Huxley, MD;  Location: [place]  CV LAB;  Service: Cardiovascular;  Laterality: N/A;  . SECONDARY CLOSURE OF WOUND N/A 01/08/2017   Procedure: SECONDARY CLOSURE FOR EVISCERATION;  Surgeon: Florene Glen, MD;  Location: [facility] ORS;  Service: General;  Laterality: N/A;  . SIGMOIDECTOMY    . TOTAL KNEE ARTHROPLASTY Right     Social History   Socioeconomic History  . Marital status: Married    Spouse name: Not on file  . Number of children: Not on file  . Years of education: Not on file  . Highest education level: Not on file  Social  Needs  . Financial resource strain: Not on file  . Food insecurity - worry: Not on file  . Food insecurity - inability: Not on file  . Transportation needs - medical: Not on file  . Transportation needs - non-medical: Not on file  Occupational History  . Not on file  Tobacco Use  . Smoking status: Former Smoker    Packs/day: 0.25    Years: 29.00    Pack years: 7.25    Types: Cigarettes    Last attempt to quit: 12/31/1993    Years since quitting: 24.1  . Smokeless tobacco: Never Used  Substance and Sexual Activity  . Alcohol use: No    Alcohol/week: 0.0 oz  . Drug use: No  . Sexual activity: Not on file  Other Topics Concern  . Not on file  Social History Narrative  . Not on file     Family History  Problem Relation Age of Onset  . Diabetes Sister   . Diabetes Brother   . Diabetes Sister   . Diabetes Brother      Current Outpatient Medications:  .  acetaminophen (TYLENOL) 500 MG tablet, Take 1,000 mg by mouth every 6 (six) hours as needed., Disp: , Rfl:  .  amLODipine (NORVASC) 5 MG tablet, Take 5 mg by mouth daily., Disp: , Rfl:  .  aspirin EC 81 MG EC tablet, Take 1 tablet (81 mg total) by mouth daily., Disp: 30 tablet, Rfl: 5 .  Cholecalciferol (VITAMIN D3) 2000 units capsule, Take 2,000 Units by mouth daily., Disp: , Rfl:  .  docusate calcium (SURFAK) 240 MG capsule, Take 1 capsule (240 mg total) by mouth 2 (two) times daily as needed for mild constipation., Disp: 30 capsule, Rfl: 0 .  dorzolamide-timolol (COSOPT) 22.3-6.8 MG/ML ophthalmic solution, Apply 1 drop to eye 2 (two) times daily., Disp: , Rfl:  .  ferrous gluconate (FERGON) 324 MG tablet, Take 1 tablet (324 mg total) by mouth 2 (two) times daily with a meal., Disp: 60 tablet, Rfl: 5 .  latanoprost (XALATAN) 0.005 % ophthalmic solution, Apply 1 drop to eye at bedtime., Disp: , Rfl:  .  metFORMIN (GLUCOPHAGE-XR) 500 MG 24 hr tablet, 1,000 mg daily with breakfast. , Disp: , Rfl:  .  Multiple Vitamins-Minerals  (CENTRUM SILVER PO), Take by mouth every morning., Disp: , Rfl:  .  Omega-3 Fatty Acids (FISH OIL) 1200 MG CAPS, Take 2 capsules by mouth daily., Disp: , Rfl:  .  pantoprazole (PROTONIX) 40 MG tablet, Take 1 tablet (40 mg total) by mouth daily., Disp: 30 tablet, Rfl: 0 .  polyethylene glycol (MIRALAX / GLYCOLAX) packet, Take 17 g by mouth daily., Disp: 14 each, Rfl: 0 .  pravastatin (PRAVACHOL) 40 MG tablet, Take 40 mg by mouth daily., Disp: , Rfl:  .  SHINGRIX injection, , Disp: , Rfl:  No current facility-administered medications for this visit.   Facility-Administered Medications  Ordered in Other Visits:  .  ferumoxytol (FERAHEME) 510 mg in sodium chloride 0.9 % 100 mL IVPB, 510 mg, Intravenous, Once, Sindy Guadeloupe, MD, 510 mg at 03/07/18 1058   Physical exam:  Vitals:   03/07/18 1010 03/07/18 1012  BP:  123/75  Pulse:  82  Resp:  16  Temp:  98 F (36.7 C)  TempSrc: Tympanic Tympanic  SpO2:  98%  Weight: 283 lb (128.4 kg)   Height: 6\' 4"  (1.93 m)    Physical Exam  Constitutional: He is oriented to person, place, and time and well-developed, well-nourished, and in no distress.  HENT:  Head: Normocephalic and atraumatic.  Eyes: EOM are normal. Pupils are equal, round, and reactive to light.  Neck: Normal range of motion.  Cardiovascular: Normal rate, regular rhythm and normal heart sounds.  Pulmonary/Chest: Effort normal and breath sounds normal.  Abdominal: Soft. Bowel sounds are normal.  Neurological: He is alert and oriented to person, place, and time.  Skin: Skin is warm and dry.       CMP Latest Ref Rng & Units 02/22/2018  Glucose 65 - 99 mg/dL 284(H)  BUN 6 - 20 mg/dL 15  Creatinine 0.61 - 1.24 mg/dL 0.94  Sodium 135 - 145 mmol/L 139  Potassium 3.5 - 5.1 mmol/L 3.9  Chloride 101 - 111 mmol/L 105  CO2 22 - 32 mmol/L 25  Calcium 8.9 - 10.3 mg/dL 8.8(L)  Total Protein 6.5 - 8.1 g/dL 5.9(L)  Total Bilirubin 0.3 - 1.2 mg/dL 0.8  Alkaline Phos 38 - 126 U/L 64  AST  15 - 41 U/L 20  ALT 17 - 63 U/L 18   CBC Latest Ref Rng & Units 02/25/2018  WBC 3.8 - 10.6 K/uL 10.7(H)  Hemoglobin 13.0 - 18.0 g/dL 9.4(L)  Hematocrit 40.0 - 52.0 % 27.6(L)  Platelets 150 - 440 K/uL 272    No images are attached to the encounter.  Dg Knee 1-2 Views Left  Result Date: 02/18/2018 CLINICAL DATA:  Fall due to syncope, weakness in the left leg with difficulty bearing weight EXAM: LEFT KNEE - 1-2 VIEW COMPARISON:  None. FINDINGS: Severe osteoarthritis with full-thickness loss of articular space in the medial compartment and severe marginal spurring in all 3 compartments. Moderate knee effusion is observed on the lateral projection. There is some faint linear calcification posterior to the femoral condyles on the lateral projection, probably vascular and less likely to be a free fragment in the joint. There is no well-defined fracture on this two view series. IMPRESSION: 1. Severe osteoarthritis including prominent spurring and full-thickness loss of articular cartilage in the medial compartment. 2. No appreciable fracture. Reduced sensitivity for subtle fractures due to the severity of spurring and cortical irregularities. 3. Moderate knee effusion. 4. Given that the patient is unable to bear weight, MRI or CT may be warranted for further characterization. Electronically Signed   By: Van Clines M.D.   On: 02/18/2018 09:41   Ct Knee Left Wo Contrast  Result Date: 02/18/2018 CLINICAL DATA:  Left knee pain after recent fall. EXAM: CT OF THE LEFT KNEE WITHOUT CONTRAST TECHNIQUE: Multidetector CT imaging of the left knee was performed according to the standard protocol. Multiplanar CT image reconstructions were also generated. COMPARISON:  Left knee x-rays from same day. FINDINGS: Bones/Joint/Cartilage No acute fracture or dislocation. Moderate joint effusion. No evidence of lipohemarthrosis. Severe medial and mild lateral and patellofemoral compartment joint space narrowing with  large marginal osteophytes. Ligaments Suboptimally assessed by CT. Muscles  and Tendons Mild diffuse fatty muscle infiltration. The visualized tendons are unremarkable. The extensor mechanism is intact. Soft tissues Unremarkable. IMPRESSION: 1. No acute fracture or dislocation. Moderate joint effusion without evidence of lipohemarthrosis. 2. Tricompartmental osteoarthritis, severe in the medial compartment. Electronically Signed   By: Titus Dubin M.D.   On: 02/18/2018 13:02   Nm Gi Blood Loss  Result Date: 02/22/2018 CLINICAL DATA:  Hematochezia. EXAM: NUCLEAR MEDICINE GASTROINTESTINAL BLEEDING SCAN TECHNIQUE: Sequential abdominal images were obtained following intravenous administration of Tc-54m labeled red blood cells. Imaging was performed for 2 hr. RADIOPHARMACEUTICALS:  22.8 mCi Tc-15m pertechnetate in-vitro labeled red cells. COMPARISON:  12/27/2016 FINDINGS: No abnormal radiopharmaceutical activity seen within the small or large bowel. Radiopharmaceutical activity matching blood pool is seen in the region of the genitalia, which is confirmed on the lateral projection and is unchanged compared to previous study. IMPRESSION: No evidence of active GI bleeding. Electronically Signed   By: Earle Gell M.D.   On: 02/22/2018 17:00    Assessment and plan- Patient is a 73 y.o. male with iron deficiency anemia likely secondary to intermittent diverticular bleeding.  Patient had surgery for his diverticulosis back in January 2018.  Since January 2019 he has had intermittent episodes of diverticular bleed which are self-limited.  Colonoscopy x2 did not reveal any active bleeding and upper endoscopy was normal.  His iron deficiency anemia is likely secondary to intermittent self-limited diverticular bleeding.  I will defer decision to get a capsule endoscopy to GI.  With regards to treatment of his iron deficiency anemia I discussed risks and benefits of Feraheme 510 mg IV x2 weekly including all but not  limited to headache, leg swelling and possible risk of infusion reactions.  Patient understands and agrees to proceed as planned.  We will plan to give him first dose of Feraheme today and the second dose next week.  I will see him back in 2 months time with repeat CBC ferritin and iron studies as well as B12 and folate.  I will check urinalysis today.   Thank you for this kind referral and the opportunity to participate in the care of this patient   Visit Diagnosis 1. Iron deficiency anemia due to chronic blood loss     Dr. Randa Evens, MD, MPH [facility] at [facility] Pager- [phone] 03/07/2018  11:08 AM

## 2018-03-10 NOTE — Progress Notes (Signed)
Evan Antigua, MD [address]  [place]  [place], [place] [address]  Main: [phone]  Fax: [phone]   Primary Care Physician: Ezequiel Kayser, MD  Primary Gastroenterologist:  Dr. Vonda [name]  Chief Complaint  Patient presents with  . Follow-up    results from colonoscopy, endoscopy in hospital 02/24/2018    HPI: Evan [name] is a 73 y.o. male with history of recurrent diverticular bleeding, presents for hospital follow-up.  Patient denies any episodes of GI bleeding since being at home.  Is taking a bowel regimen and is having 1-2 soft stools daily without blood.  Denies any abdominal pain.  Normal appetite.  No nausea or vomiting.  No heartburn.  No weight loss.  Is taking oral iron.  Current Outpatient Medications  Medication Sig Dispense Refill  . amLODipine (NORVASC) 5 MG tablet Take 5 mg by mouth daily.    [name] aspirin EC 81 MG EC tablet Take 1 tablet (81 mg total) by mouth daily. 30 tablet 5  . Cholecalciferol (VITAMIN D3) 2000 units capsule Take 2,000 Units by mouth daily.    [name] docusate calcium (SURFAK) 240 MG capsule Take 1 capsule (240 mg total) by mouth 2 (two) times daily as needed for mild constipation. 30 capsule 0  . dorzolamide-timolol (COSOPT) 22.3-6.8 MG/ML ophthalmic solution Apply 1 drop to eye 2 (two) times daily.    . ferrous gluconate (FERGON) 324 MG tablet Take 1 tablet (324 mg total) by mouth 2 (two) times daily with a meal. 60 tablet 5  . latanoprost (XALATAN) 0.005 % ophthalmic solution Apply 1 drop to eye at bedtime.    . metFORMIN (GLUCOPHAGE-XR) 500 MG 24 hr tablet 1,000 mg daily with breakfast.     . Multiple Vitamins-Minerals (CENTRUM SILVER PO) Take by mouth every morning.    . Omega-3 Fatty Acids (FISH OIL) 1200 MG CAPS Take 2 capsules by mouth daily.    . pantoprazole (PROTONIX) 40 MG tablet Take 1 tablet (40 mg total) by mouth daily. 30 tablet 0  . polyethylene glycol (MIRALAX / GLYCOLAX) packet Take 17 g by mouth daily. 14  each 0  . pravastatin (PRAVACHOL) 40 MG tablet Take 40 mg by mouth daily.    [name] acetaminophen (TYLENOL) 500 MG tablet Take 1,000 mg by mouth every 6 (six) hours as needed.    [name] SHINGRIX injection      No current facility-administered medications for this visit.     Allergies as of 03/06/2018 - Review Complete 03/06/2018  Allergen Reaction Noted  . Azithromycin Rash 07/09/2016  . Penicillins Rash 07/09/2016  . Lisinopril Cough 07/09/2016  . Losartan Cough 07/09/2016  . Metoprolol Other (See Comments) 07/09/2016    ROS:  General: Negative for anorexia, weight loss, fever, chills, fatigue, weakness. ENT: Negative for hoarseness, difficulty swallowing , nasal congestion. CV: Negative for chest pain, angina, palpitations, dyspnea on exertion, peripheral edema.  Respiratory: Negative for dyspnea at rest, dyspnea on exertion, cough, sputum, wheezing.  GI: See history of present illness. GU:  Negative for dysuria, hematuria, urinary incontinence, urinary frequency, nocturnal urination.  Endo: Negative for unusual weight change.    Physical Examination:   BP 125/72   Pulse 78   Temp (!) 97.4 F (36.3 C) (Oral)   Ht 6\' 4"  (1.09 m)   Wt 284 lb 9.6 oz (129.1 kg)   BMI 34.64 kg/m   General: Well-nourished, well-developed in no acute distress.  Eyes: No icterus. Conjunctivae pink. Mouth: Oropharyngeal mucosa moist and pink , no  lesions erythema or exudate. Neck: Supple, Trachea midline Abdomen: Bowel sounds are normal, nontender, nondistended, no hepatosplenomegaly or masses, no abdominal bruits or hernia , no rebound or guarding.   Extremities: No lower extremity edema. No clubbing or deformities. Neuro: Alert and oriented x 3.  Grossly intact. Skin: Warm and dry, no jaundice.   Psych: Alert and cooperative, normal mood and affect.   Labs: CMP     Component Value Date/Time   NA 139 02/22/2018 0510   NA 136 03/26/2014 0418   K 3.9 02/22/2018 0510   K 4.0 03/26/2014 0418   CL  105 02/22/2018 0510   CL 102 03/26/2014 0418   CO2 25 02/22/2018 0510   CO2 34 (H) 03/26/2014 0418   GLUCOSE 284 (H) 02/22/2018 0510   GLUCOSE 114 (H) 03/26/2014 0418   BUN 15 02/22/2018 0510   BUN 8 03/26/2014 0418   CREATININE 0.94 02/22/2018 0510   CREATININE 0.90 03/26/2014 0418   CALCIUM 8.8 (L) 02/22/2018 0510   CALCIUM 9.2 03/26/2014 0418   PROT 5.9 (L) 02/22/2018 0510   PROT 6.3 (L) 10/04/2013 0533   ALBUMIN 3.4 (L) 02/22/2018 0510   ALBUMIN 3.0 (L) 10/04/2013 0533   AST 20 02/22/2018 0510   AST 8 (L) 10/04/2013 0533   ALT 18 02/22/2018 0510   ALT 17 10/04/2013 0533   ALKPHOS 64 02/22/2018 0510   ALKPHOS 92 10/04/2013 0533   BILITOT 0.8 02/22/2018 0510   BILITOT 1.7 (H) 10/04/2013 0533   GFRNONAA >60 02/22/2018 0510   GFRNONAA >60 03/26/2014 0418   GFRAA >60 02/22/2018 0510   GFRAA >60 03/26/2014 0418   Lab Results  Component Value Date   WBC 10.7 (H) 02/25/2018   HGB 9.4 (L) 02/25/2018   HCT 27.6 (L) 02/25/2018   MCV 87.1 02/25/2018   PLT 272 02/25/2018    Imaging Studies: Dg Knee 1-2 Views Left  Result Date: 02/18/2018 CLINICAL DATA:  Fall due to syncope, weakness in the left leg with difficulty bearing weight EXAM: LEFT KNEE - 1-2 VIEW COMPARISON:  None. FINDINGS: Severe osteoarthritis with full-thickness loss of articular space in the medial compartment and severe marginal spurring in all 3 compartments. Moderate knee effusion is observed on the lateral projection. There is some faint linear calcification posterior to the femoral condyles on the lateral projection, probably vascular and less likely to be a free fragment in the joint. There is no well-defined fracture on this two view series. IMPRESSION: 1. Severe osteoarthritis including prominent spurring and full-thickness loss of articular cartilage in the medial compartment. 2. No appreciable fracture. Reduced sensitivity for subtle fractures due to the severity of spurring and cortical irregularities. 3.  Moderate knee effusion. 4. Given that the patient is unable to bear weight, MRI or CT may be warranted for further characterization. Electronically Signed   By: Van Clines M.D.   On: 02/18/2018 09:41   Ct Knee Left Wo Contrast  Result Date: 02/18/2018 CLINICAL DATA:  Left knee pain after recent fall. EXAM: CT OF THE LEFT KNEE WITHOUT CONTRAST TECHNIQUE: Multidetector CT imaging of the left knee was performed according to the standard protocol. Multiplanar CT image reconstructions were also generated. COMPARISON:  Left knee x-rays from same day. FINDINGS: Bones/Joint/Cartilage No acute fracture or dislocation. Moderate joint effusion. No evidence of lipohemarthrosis. Severe medial and mild lateral and patellofemoral compartment joint space narrowing with large marginal osteophytes. Ligaments Suboptimally assessed by CT. Muscles and Tendons Mild diffuse fatty muscle infiltration. The visualized tendons are unremarkable.  The extensor mechanism is intact. Soft tissues Unremarkable. IMPRESSION: 1. No acute fracture or dislocation. Moderate joint effusion without evidence of lipohemarthrosis. 2. Tricompartmental osteoarthritis, severe in the medial compartment. Electronically Signed   By: Titus Dubin M.D.   On: 02/18/2018 13:02   Nm Gi Blood Loss  Result Date: 02/22/2018 CLINICAL DATA:  Hematochezia. EXAM: NUCLEAR MEDICINE GASTROINTESTINAL BLEEDING SCAN TECHNIQUE: Sequential abdominal images were obtained following intravenous administration of Tc-41m labeled red blood cells. Imaging was performed for 2 hr. RADIOPHARMACEUTICALS:  22.8 mCi Tc-22m pertechnetate in-vitro labeled red cells. COMPARISON:  12/27/2016 FINDINGS: No abnormal radiopharmaceutical activity seen within the small or large bowel. Radiopharmaceutical activity matching blood pool is seen in the region of the genitalia, which is confirmed on the lateral projection and is unchanged compared to previous study. IMPRESSION: No evidence of  active GI bleeding. Electronically Signed   By: Earle Gell M.D.   On: 02/22/2018 17:00    Assessment and Plan:   Kristin Lamagna is a 73 y.o. y/o male with history of recurrent diverticular bleeding, here for hospital follow-up  Continue to maintain soft stool Increase MiraLAX to twice a day  stop docusate, as we are increasing MiraLAX to twice a day  Will refer to hematology to evaluate for IV iron transfusions Would like to avoid constipation with oral iron, and IVR and would allow to maintain iron levels to normal  better for this elderly patient  Patient educated on alarm symptoms to inform us with or to go to the ER with, including blood in stool, worsening abdominal pain or any other reason for concern  We will also send him back to his surgeon, due to recurrent diverticular bleeds.  He has not been seen by them during his last admission    Dr Evan [name]

## 2018-03-13 ENCOUNTER — Telehealth: Payer: Self-pay

## 2018-03-13 NOTE — Telephone Encounter (Signed)
Called [facility] (Dr. Burt Knack) to have f/u appt made with him for recurrent diverticuli bleeding. He has an appt for 03/19/18 per office staff.

## 2018-03-14 ENCOUNTER — Inpatient Hospital Stay: Payer: Medicare HMO

## 2018-03-14 VITALS — BP 140/70 | HR 82 | Resp 20

## 2018-03-14 DIAGNOSIS — D500 Iron deficiency anemia secondary to blood loss (chronic): Secondary | ICD-10-CM | POA: Diagnosis not present

## 2018-03-14 MED ORDER — SODIUM CHLORIDE 0.9 % IV SOLN
Freq: Once | INTRAVENOUS | Status: AC
  Administered 2018-03-14: 14:00:00 via INTRAVENOUS
  Filled 2018-03-14: qty 1000

## 2018-03-14 MED ORDER — SODIUM CHLORIDE 0.9 % IV SOLN
510.0000 mg | Freq: Once | INTRAVENOUS | Status: AC
  Administered 2018-03-14: 510 mg via INTRAVENOUS
  Filled 2018-03-14: qty 17

## 2018-03-19 ENCOUNTER — Ambulatory Visit: Payer: Medicare HMO | Admitting: Surgery

## 2018-03-19 ENCOUNTER — Encounter: Payer: Self-pay | Admitting: Surgery

## 2018-03-19 VITALS — BP 144/81 | HR 73 | Resp 14 | Ht 75.0 in | Wt 283.0 lb

## 2018-03-19 DIAGNOSIS — Z8719 Personal history of other diseases of the digestive system: Secondary | ICD-10-CM | POA: Diagnosis not present

## 2018-03-19 NOTE — Patient Instructions (Signed)
Diet:  There is no particular "diverticulitis diet" to follow.  The main instruction is everything in moderation, and continue with a higher fiber content.  If you notice that a particular food type causes abdominal pain or discomfort, then stay away from it.  Medications:  Continue taking MiraLax once daily to keep your stool soft and so you do not strain.  Do not need to take Docusate unless instructed to do so.  Bleeding:  At this point, there is no clear source for your bleeding, and thus, no surgical need. Please call us or come to hospital if you experience another bleeding episode, where the workup would be restarted.    Please call us if any questions or concerns.

## 2018-03-19 NOTE — Progress Notes (Signed)
03/19/2018  History of Present Illness: Evan [name] is a 73 y.o. male who presents for follow up of GI bleeding. He is s/p exlap and sigmoidectomy on 01/01/17 for persistent GI bleeding due to diverticular bleeding.  He had been doing well but more recently has had intermittent episodes of bleeding.  He was actually hospitalized last month twice for it.  He reports that he thinks it started because he stopped taking his MiraLax.  He had been having normal bowel movements and then stopped his MiraLax and then had issues with constipation or straining harder.  While hospitalized, he had a nuclear medicine tagged RBC scan and two colonoscopies.  The tagged scan was negative. The first colonoscopy found old blood on left colon, with diverticulosis on ascending and left colon, but no active bleeding.  The second colonoscopy did not see any blood at all, and only the diverticulosis.  He had required IV iron infusions due to the resultant anemia from the GI bleeding.  No other interventions were needed.  Since his hospitalization, he has not had any further episodes of bleeding.  He is taking Miralax once daily and has normal daily bowel movements.  Denies ever having any abdominal pain, nausea, or vomiting.  Past Medical History: Past Medical History:  Diagnosis Date  . Arthritis    xDJD  . Atrial fibrillation ([place])   . BPH (benign prostatic hyperplasia)   . CHF (congestive heart failure) ([place])   . Diabetes mellitus without complication ([place])   . Discitis   . Diverticulitis   . Diverticulosis   . DVT (deep venous thrombosis) ([place])   . Erectile dysfunction   . Hearing loss   . Hyperlipidemia   . Hypertension   . Ischemic optic neuropathy   . Lower GI bleeding   . Non-ischemic cardiomyopathy ([place])   . Sleep apnea      Past Surgical History: Past Surgical History:  Procedure Laterality Date  . ATRIAL ABLATION SURGERY    . CHOLECYSTECTOMY    . COLONOSCOPY Left 02/24/2018   Procedure:  COLONOSCOPY;  Surgeon: Virgel Manifold, MD;  Location: [facility] ENDOSCOPY;  Service: Endoscopy;  Laterality: Left;  . COLONOSCOPY WITH PROPOFOL N/A 09/25/2017   Procedure: COLONOSCOPY WITH PROPOFOL;  Surgeon: Manya Silvas, MD;  Location: [facility] ENDOSCOPY;  Service: Endoscopy;  Laterality: N/A;  . COLONOSCOPY WITH PROPOFOL N/A 02/18/2018   Procedure: COLONOSCOPY WITH PROPOFOL;  Surgeon: [name], MD;  Location: [facility] ENDOSCOPY;  Service: Endoscopy;  Laterality: N/A;  . ESOPHAGOGASTRODUODENOSCOPY Left 02/23/2018   Procedure: ESOPHAGOGASTRODUODENOSCOPY (EGD);  Surgeon: Virgel Manifold, MD;  Location: [facility] ENDOSCOPY;  Service: Endoscopy;  Laterality: Left;  . JOINT REPLACEMENT Right    knee  . LAPAROTOMY  01/01/2017   Procedure: EXPLORATORY LAPAROTOMY;  Surgeon: Olean Ree, MD;  Location: [facility] ORS;  Service: General;;  . PARTIAL COLECTOMY N/A 01/01/2017   Procedure: PARTIAL COLECTOMY;  Surgeon: Olean Ree, MD;  Location: [facility] ORS;  Service: General;  Laterality: N/A;  . PERIPHERAL VASCULAR CATHETERIZATION N/A 12/28/2016   Procedure: Visceral Artery Intervention;  Surgeon: Algernon Huxley, MD;  Location: [facility];  Service: Cardiovascular;  Laterality: N/A;  . SECONDARY CLOSURE OF WOUND N/A 01/08/2017   Procedure: SECONDARY CLOSURE FOR EVISCERATION;  Surgeon: Florene Glen, MD;  Location: [facility] ORS;  Service: General;  Laterality: N/A;  . SIGMOIDECTOMY    . TOTAL KNEE ARTHROPLASTY Right     Home Medications: Prior to Admission medications   Medication Sig Start Date End  Date Taking? Authorizing Provider  acetaminophen (TYLENOL) 500 MG tablet Take 1,000 mg by mouth every 6 (six) hours as needed.   Yes [provider]  amLODipine (NORVASC) 5 MG tablet Take 5 mg by mouth daily. 05/03/16  Yes [provider]  aspirin EC 81 MG EC tablet Take 1 tablet (81 mg total) by mouth daily. 01/16/17  Yes Loflin, Lavona Mound, MD  Cholecalciferol (VITAMIN D3) 2000 units capsule  Take 2,000 Units by mouth daily.   Yes [provider]  dorzolamide-timolol (COSOPT) 22.3-6.8 MG/ML ophthalmic solution Apply 1 drop to eye 2 (two) times daily.   Yes [provider]  ferrous gluconate (FERGON) 324 MG tablet Take 1 tablet (324 mg total) by mouth 2 (two) times daily with a meal. 01/06/17  Yes Theodoro Grist, MD  latanoprost (XALATAN) 0.005 % ophthalmic solution Apply 1 drop to eye at bedtime.   Yes [provider]  Melatonin 10 MG TABS Take by mouth.   Yes [provider]  metFORMIN (GLUCOPHAGE-XR) 500 MG 24 hr tablet 1,000 mg daily with breakfast.  08/30/17  Yes [provider]  Multiple Vitamins-Minerals (CENTRUM SILVER PO) Take by mouth every morning.   Yes [provider]  Omega-3 Fatty Acids (FISH OIL) 1200 MG CAPS Take 2 capsules by mouth daily.   Yes [provider]  pantoprazole (PROTONIX) 40 MG tablet Take 1 tablet (40 mg total) by mouth daily. 02/25/18  Yes Gouru, Illene Silver, MD  polyethylene glycol (MIRALAX / GLYCOLAX) packet Take 17 g by mouth daily. 02/25/18  Yes Gouru, Illene Silver, MD  pravastatin (PRAVACHOL) 40 MG tablet Take 40 mg by mouth daily. 05/03/16  Yes [provider]  [facility] injection  08/15/17  Yes [provider]    Allergies: Allergies  Allergen Reactions  . Azithromycin Rash  . Penicillins Rash    Has patient had a PCN reaction causing immediate rash, facial/tongue/throat swelling, SOB or lightheadedness with hypotension: Yes Has patient had a PCN reaction causing severe rash involving mucus membranes or skin necrosis: Yes Has patient had a PCN reaction that required hospitalization No Has patient had a PCN reaction occurring within the last 10 years: No If all of the above answers are "NO", then may proceed with Cephalosporin use. Has patient had a PCN reaction causing immediate rash, facial/tongue/throat swelling, SOB or lightheadedness with hypotension: Yes Has patient had a PCN  reaction causing severe rash involving mucus membranes or skin necrosis: Yes Has patient had a PCN reaction that required hospitalization No Has patient had a PCN reaction occurring within the last 10 years: No If all of the above answers are "NO", then may proceed with Cephalosporin use.   [name] Lisinopril Cough  . Losartan Cough  . Metoprolol Other (See Comments)    Bradycardia.    Social History:  reports that he quit smoking about 24 years ago. His smoking use included cigarettes. He has a 7.25 pack-year smoking history. he has never used smokeless tobacco. He reports that he does not drink alcohol or use drugs.   Family History: Family History  Problem Relation Age of Onset  . Diabetes Sister   . Diabetes Brother   . Diabetes Sister   . Diabetes Brother     Review of Systems: Review of Systems  Constitutional: Negative for chills and fever.  Respiratory: Negative for shortness of breath.   Cardiovascular: Negative for chest pain.  Gastrointestinal: Negative for abdominal pain, blood in stool, constipation, diarrhea, nausea and vomiting.  Physical Exam BP (!) 144/81   Pulse 73   Resp 14   Ht 6\' 3"  (1.905 m)   Wt 128.4 kg (283 lb)   BMI 35.37 kg/m  CONSTITUTIONAL: No acute distress RESPIRATORY:  Lungs are clear, and breath sounds are equal bilaterally. Normal respiratory effort without pathologic use of accessory muscles. CARDIOVASCULAR: Heart is regular without murmurs, gallops, or rubs. GI: The abdomen is soft, obese, nondistended, nontender to palpation.  Midline incision has healed well.  MUSCULOSKELETAL:  Normal muscle strength and tone in all four extremities.  No peripheral edema or cyanosis. SKIN: Skin turgor is normal. There are no pathologic skin lesions.  NEUROLOGIC:  Motor and sensation is grossly normal.  Cranial nerves are grossly intact. PSYCH:  Alert and oriented to person, place and time. Affect is normal.  Labs/Imaging: Last Hgb was 9.4 on 2/26  which was day of discharge from his last hospitalization.  Assessment and Plan: This is a 73 y.o. male who presents for follow up from GI bleed last month.  I have independently viewed his hospitalization imaging studies, colonoscopies, and reviewed his laboratory studies.  There was no evidence of active bleed on tagged RBC scan or either colonoscopy.  Discussed with the patient that at this point, with a negative nuclear scan, and two colonoscopies not revealing active bleeding or a clear source, there is no surgical intervention needed.  Did discuss with the patient that without a clear target, he would require a subtotal colectomy with ileorectal anastomosis, which would be quite a significant surgical procedure to undertake and would be lifestyle changing as he would likely have 5+ bowel movements per day.  Discussed with patient that he should continue taking his MiraLax and avoid constipation or straining.  This may have contributed to his bleeding episode.    Discussed also that there is no "diverticulitis diet".  From my standpoint, a healthy diet with everything in moderation is ok, but should keep a higher fiber content.  If he notices that a particular food type causes pain or discomfort, then stay away from it.    Patient may follow up with [place] on an as needed basis.  He knows to call if questions or concerns.  Face-to-face time spent with the patient and care providers was 40 minutes, with more than 50% of the time spent counseling, educating, and coordinating care of the patient.     Melvyn Neth, [place]

## 2018-03-20 ENCOUNTER — Ambulatory Visit: Payer: Medicare HMO | Admitting: Gastroenterology

## 2018-05-08 ENCOUNTER — Encounter: Payer: Self-pay | Admitting: Oncology

## 2018-05-08 ENCOUNTER — Inpatient Hospital Stay: Payer: Medicare HMO

## 2018-05-08 ENCOUNTER — Inpatient Hospital Stay: Payer: Medicare HMO | Attending: Oncology | Admitting: Oncology

## 2018-05-08 ENCOUNTER — Other Ambulatory Visit: Payer: Self-pay

## 2018-05-08 VITALS — BP 139/71 | HR 69 | Temp 97.9°F | Resp 18 | Ht 75.0 in | Wt 258.8 lb

## 2018-05-08 DIAGNOSIS — D500 Iron deficiency anemia secondary to blood loss (chronic): Secondary | ICD-10-CM | POA: Diagnosis not present

## 2018-05-08 DIAGNOSIS — Z87891 Personal history of nicotine dependence: Secondary | ICD-10-CM | POA: Insufficient documentation

## 2018-05-08 DIAGNOSIS — K922 Gastrointestinal hemorrhage, unspecified: Secondary | ICD-10-CM

## 2018-05-08 LAB — CBC
HEMATOCRIT: 46 % (ref 40.0–52.0)
HEMOGLOBIN: 15.1 g/dL (ref 13.0–18.0)
MCH: 27.7 pg (ref 26.0–34.0)
MCHC: 32.9 g/dL (ref 32.0–36.0)
MCV: 84.4 fL (ref 80.0–100.0)
Platelets: 266 10*3/uL (ref 150–440)
RBC: 5.45 MIL/uL (ref 4.40–5.90)
RDW: 15.2 % — ABNORMAL HIGH (ref 11.5–14.5)
WBC: 7 10*3/uL (ref 3.8–10.6)

## 2018-05-08 LAB — IRON AND TIBC
Iron: 58 ug/dL (ref 45–182)
Saturation Ratios: 14 % — ABNORMAL LOW (ref 17.9–39.5)
TIBC: 430 ug/dL (ref 250–450)
UIBC: 372 ug/dL

## 2018-05-08 LAB — FOLATE: FOLATE: 27 ng/mL (ref >5.9)

## 2018-05-08 LAB — VITAMIN B12: Vitamin B-12: 294 pg/mL (ref 180–914)

## 2018-05-08 LAB — FERRITIN: FERRITIN: 16 ng/mL — AB (ref 24–336)

## 2018-05-08 NOTE — Progress Notes (Signed)
Hematology/Oncology Consult note [facility]  [phone] [phone]  Patient Care Team: Ezequiel Kayser, MD as PCP - General (Internal Medicine)   Name of the patient: Evan [name]  [id number]  [birth year]   Date of visit: 05/08/18  Diagnosis- iron deficiency anemia likely due to intermittent diverticular bleeding  Chief complaint/ Reason for visit- routine f/u of anemia  Heme/Onc history: patient is a 73 year old African-American male with a prior history of diverticulosis and diverticular bleed status post partial colectomy in January 2018.  He presented with symptoms of hematochezia to the hospital in February 2019 and underwent colonoscopy by Dr. Allen Norris which showed evidence of diverticulosis but no active diverticular bleed.  There was some blood noted in the left colon.  This was followed by another colonoscopy on 02/24/2018 which again did not show any evidence of active bleeding.  Diverticulosis noted.  He also had an upper endoscopy which was unremarkable.  The cause of his GI bleed was attributed to intermittent self-limited diverticular bleeding.  Patient has not had a capsule endoscopy yet.  He has been referred to [place] for iron deficiency anemia.  Patient denies any consistent use of NSAIDs.  He has been on oral iron twice a day for the last 2 years.  He denies any fatigue or unintentional weight loss.  Denies any blood in his urine.  Patient's hemoglobin was around 7-8 in January 2018 when he had the initial episode of diverticular bleed.  In February 2019 his hemoglobin was 12.4 and had been gradually trending down to 9.  On 02/23/2018 he suddenly dropped his hemoglobin to 5.6 and required blood transfusion at that time.  Most recent CBC from 02/25/2018 showed white count of 10.7, H&H of 9.4/27.6 and a platelet count of 272.  B12 levels last checked in May 2018 was 378. Iron studies on 02/27/2018 were as follows: Ferritin was low at 22, serum  iron low at 25.  TIBC was high at 450.  Patient denies any hematochezia over the last 5-6 days  Interval history- feels that his energy levels are much improved after IV iron. Denies any bleeding in stools or dark tarry stools  ECOG PS- 1 Pain scale- 0   Review of systems- Review of Systems  Constitutional: Negative for chills, fever, malaise/fatigue and weight loss.  HENT: Negative for congestion, ear discharge and nosebleeds.   Eyes: Negative for blurred vision.  Respiratory: Negative for cough, hemoptysis, sputum production, shortness of breath and wheezing.   Cardiovascular: Negative for chest pain, palpitations, orthopnea and claudication.  Gastrointestinal: Negative for abdominal pain, blood in stool, constipation, diarrhea, heartburn, melena, nausea and vomiting.  Genitourinary: Negative for dysuria, flank pain, frequency, hematuria and urgency.  Musculoskeletal: Negative for back pain, joint pain and myalgias.  Skin: Negative for rash.  Neurological: Negative for dizziness, tingling, focal weakness, seizures, weakness and headaches.  Endo/Heme/Allergies: Does not bruise/bleed easily.  Psychiatric/Behavioral: Negative for depression and suicidal ideas. The patient does not have insomnia.      Allergies  Allergen Reactions  . Azithromycin Rash  . Penicillins Rash    Has patient had a PCN reaction causing immediate rash, facial/tongue/throat swelling, SOB or lightheadedness with hypotension: Yes Has patient had a PCN reaction causing severe rash involving mucus membranes or skin necrosis: Yes Has patient had a PCN reaction that required hospitalization No Has patient had a PCN reaction occurring within the last 10 years: No If all of the above answers are "NO", then may proceed  with Cephalosporin use. Has patient had a PCN reaction causing immediate rash, facial/tongue/throat swelling, SOB or lightheadedness with hypotension: Yes Has patient had a PCN reaction causing severe  rash involving mucus membranes or skin necrosis: Yes Has patient had a PCN reaction that required hospitalization No Has patient had a PCN reaction occurring within the last 10 years: No If all of the above answers are "NO", then may proceed with Cephalosporin use.   [name] Lisinopril Cough  . Losartan Cough  . Metoprolol Other (See Comments)    Bradycardia.     Past Medical History:  Diagnosis Date  . Arthritis    xDJD  . Atrial fibrillation ([place])   . BPH (benign prostatic hyperplasia)   . CHF (congestive heart failure) ([place])   . Diabetes mellitus without complication ([place])   . Discitis   . Diverticulitis   . Diverticulosis   . DVT (deep venous thrombosis) ([place])   . Erectile dysfunction   . Hearing loss   . Hyperlipidemia   . Hypertension   . Ischemic optic neuropathy   . Lower GI bleeding   . Non-ischemic cardiomyopathy ([place])   . Sleep apnea      Past Surgical History:  Procedure Laterality Date  . ATRIAL ABLATION SURGERY    . CHOLECYSTECTOMY    . COLONOSCOPY Left 02/24/2018   Procedure: COLONOSCOPY;  Surgeon: Virgel Manifold, MD;  Location[facility] ENDOSCOPY;  Service: Endoscopy;  Laterality: Left;  . COLONOSCOPY WITH PROPOFOL N/A 09/25/2017   Procedure: COLONOSCOPY WITH PROPOFOL;  Surgeon: Manya Silvas, MD;  Location: [facility] ENDOSCOPY;  Service: Endoscopy;  Laterality: N/A;  . COLONOSCOPY WITH PROPOFOL N/A 02/18/2018   Procedure: COLONOSCOPY WITH PROPOFOL;  Surgeon: [name], MD;  Location: [facility] ENDOSCOPY;  Service: Endoscopy;  Laterality: N/A;  . ESOPHAGOGASTRODUODENOSCOPY Left 02/23/2018   Procedure: ESOPHAGOGASTRODUODENOSCOPY (EGD);  Surgeon: Virgel Manifold, MD;  Location: [facility] ENDOSCOPY;  Service: Endoscopy;  Laterality: Left;  . JOINT REPLACEMENT Right    knee  . LAPAROTOMY  01/01/2017   Procedure: EXPLORATORY LAPAROTOMY;  Surgeon: Olean Ree, MD;  Location: [facility] ORS;  Service: General;;  . PARTIAL COLECTOMY N/A 01/01/2017   Procedure: PARTIAL  COLECTOMY;  Surgeon: Olean Ree, MD;  Location: [facility] ORS;  Service: General;  Laterality: N/A;  . PERIPHERAL VASCULAR CATHETERIZATION N/A 12/28/2016   Procedure: Visceral Artery Intervention;  Surgeon: Algernon Huxley, MD;  Location: [facility] CV LAB;  Service: Cardiovascular;  Laterality: N/A;  . SECONDARY CLOSURE OF WOUND N/A 01/08/2017   Procedure: SECONDARY CLOSURE FOR EVISCERATION;  Surgeon: Florene Glen, MD;  Location: [facility] ORS;  Service: General;  Laterality: N/A;  . SIGMOIDECTOMY    . TOTAL KNEE ARTHROPLASTY Right     Social History   Socioeconomic History  . Marital status: Married    Spouse name: Not on file  . Number of children: Not on file  . Years of education: Not on file  . Highest education level: Not on file  Occupational History  . Not on file  Social Needs  . Financial resource strain: Not on file  . Food insecurity:    Worry: Not on file    Inability: Not on file  . Transportation needs:    Medical: Not on file    Non-medical: Not on file  Tobacco Use  . Smoking status: Former Smoker    Packs/day: 0.25    Years: 29.00    Pack years: 7.25    Types: Cigarettes    Last attempt to  quit: 12/31/1993    Years since quitting: 24.3  . Smokeless tobacco: Never Used  Substance and Sexual Activity  . Alcohol use: No    Alcohol/week: 0.0 oz  . Drug use: No  . Sexual activity: Not on file  Lifestyle  . Physical activity:    Days per week: Not on file    Minutes per session: Not on file  . Stress: Not on file  Relationships  . Social connections:    Talks on phone: Not on file    Gets together: Not on file    Attends religious service: Not on file    Active member of club or organization: Not on file    Attends meetings of clubs or organizations: Not on file    Relationship status: Not on file  . Intimate partner violence:    Fear of current or ex partner: Not on file    Emotionally abused: Not on file    Physically abused: Not on file    Forced  sexual activity: Not on file  Other Topics Concern  . Not on file  Social History Narrative  . Not on file    Family History  Problem Relation Age of Onset  . Diabetes Sister   . Diabetes Brother   . Diabetes Sister   . Diabetes Brother      Current Outpatient Medications:  .  amLODipine (NORVASC) 5 MG tablet, Take 5 mg by mouth daily., Disp: , Rfl:  .  aspirin EC 81 MG EC tablet, Take 1 tablet (81 mg total) by mouth daily., Disp: 30 tablet, Rfl: 5 .  Cholecalciferol (VITAMIN D3) 2000 units capsule, Take 2,000 Units by mouth daily., Disp: , Rfl:  .  dorzolamide-timolol (COSOPT) 22.3-6.8 MG/ML ophthalmic solution, Apply 1 drop to eye 2 (two) times daily., Disp: , Rfl:  .  ferrous gluconate (FERGON) 324 MG tablet, Take 1 tablet (324 mg total) by mouth 2 (two) times daily with a meal., Disp: 60 tablet, Rfl: 5 .  latanoprost (XALATAN) 0.005 % ophthalmic solution, Apply 1 drop to eye at bedtime., Disp: , Rfl:  .  Melatonin 10 MG TABS, Take by mouth., Disp: , Rfl:  .  metFORMIN (GLUCOPHAGE-XR) 500 MG 24 hr tablet, 1,000 mg daily with breakfast. , Disp: , Rfl:  .  Multiple Vitamins-Minerals (CENTRUM SILVER PO), Take by mouth every morning., Disp: , Rfl:  .  Omega-3 Fatty Acids (FISH OIL) 1200 MG CAPS, Take 2 capsules by mouth daily., Disp: , Rfl:  .  polyethylene glycol (MIRALAX / GLYCOLAX) packet, Take 17 g by mouth daily., Disp: 14 each, Rfl: 0 .  pravastatin (PRAVACHOL) 40 MG tablet, Take 40 mg by mouth daily., Disp: , Rfl:  .  acetaminophen (TYLENOL) 500 MG tablet, Take 1,000 mg by mouth every 6 (six) hours as needed., Disp: , Rfl:  .  pantoprazole (PROTONIX) 40 MG tablet, Take 1 tablet (40 mg total) by mouth daily. (Patient not taking: Reported on 05/08/2018), Disp: 30 tablet, Rfl: 0 .  SHINGRIX injection, , Disp: , Rfl:   Physical exam:  Vitals:   05/08/18 1416  BP: 139/71  Pulse: 69  Resp: 18  Temp: 97.9 F (36.6 C)  TempSrc: Tympanic  SpO2: 97%  Weight: 258 lb 12.8 oz (117.4  kg)  Height: 6\' 3"  (1.905 m)   Physical Exam  Constitutional: He is oriented to person, place, and time. He appears well-developed and well-nourished.  HENT:  Head: Normocephalic and atraumatic.  Eyes: Pupils are equal,  round, and reactive to light. EOM are normal.  Neck: Normal range of motion.  Cardiovascular: Normal rate, regular rhythm and normal heart sounds.  Pulmonary/Chest: Effort normal and breath sounds normal.  Abdominal: Soft. Bowel sounds are normal.  Neurological: He is alert and oriented to person, place, and time.  Skin: Skin is warm and dry.     CMP Latest Ref Rng & Units 02/22/2018  Glucose 65 - 99 mg/dL 284(H)  BUN 6 - 20 mg/dL 15  Creatinine 0.61 - 1.24 mg/dL 0.94  Sodium 135 - 145 mmol/L 139  Potassium 3.5 - 5.1 mmol/L 3.9  Chloride 101 - 111 mmol/L 105  CO2 22 - 32 mmol/L 25  Calcium 8.9 - 10.3 mg/dL 8.8(L)  Total Protein 6.5 - 8.1 g/dL 5.9(L)  Total Bilirubin 0.3 - 1.2 mg/dL 0.8  Alkaline Phos 38 - 126 U/L 64  AST 15 - 41 U/L 20  ALT 17 - 63 U/L 18   CBC Latest Ref Rng & Units 05/08/2018  WBC 3.8 - 10.6 K/uL 7.0  Hemoglobin 13.0 - 18.0 g/dL 15.1  Hematocrit 40.0 - 52.0 % 46.0  Platelets 150 - 440 K/uL 266    Assessment and plan- Patient is a 73 y.o. male with iron deficiency anemia likely due to intermittent diverticular bleeding  Hb significantly improved to 15.1 from 9.7. Iron studies still show low ferritin of 16. I will repeat cbc ferritin and iron studies in 1, 3 and 6 months. See him in 6 months. If ferritin is still low after 1 month, I will give him more IV iron. Although he has been on oral iron he was anemic in the past. I have therefore asked him to stop his oral iron at this time. We ill give him IV iron if he becomes anemic in the future   Visit Diagnosis 1. Iron deficiency anemia due to chronic blood loss      Dr. Randa Evens, MD, MPH [facility] [phone] 05/08/2018 4:51 PM

## 2018-05-08 NOTE — Progress Notes (Signed)
No new changes noted today 

## 2018-05-09 ENCOUNTER — Telehealth: Payer: Self-pay

## 2018-05-09 DIAGNOSIS — D500 Iron deficiency anemia secondary to blood loss (chronic): Secondary | ICD-10-CM

## 2018-05-09 NOTE — Telephone Encounter (Signed)
-----   Message from Sindy Guadeloupe, MD sent at 05/09/2018  7:59 AM EDT ----- Please ask if he is taking oral b12. If not he should take 1000 mcg po daily

## 2018-05-09 NOTE — Telephone Encounter (Signed)
Spoke with [name] per Dr. Janese Banks request to inform him that his ferritin is still low but a little better with anemic. Dr Janese Banks will like to repeat CBC, Ferritin and iron studies. The patient is not current taking B-12 1,000 mcg daily. I have inform the patient to start B - 12 1,000 mcg daily and Dr Janese Banks would like to repeat labs in 1 month. The patient was agreeable and understanding.

## 2018-05-14 ENCOUNTER — Telehealth: Payer: Self-pay | Admitting: *Deleted

## 2018-05-14 NOTE — Telephone Encounter (Signed)
Patient called me back and said he got my message and he thinks he is getting mixed info. I told him that Dr. Janese Banks had sent message to [place] about hgb is better and b12 low and he needs to start b12 otc once a day but at the time of the call we did not have his ferritin level and that is what makes the decision to get iron treatment.  The ferritin was low and Dr. Janese Banks send another message with ferritin low and needs one dose of feraheme if he is agreeable. He still needs to come back in 1 month in which he has appt for already. He is agreeable to come next week and Verdis Frederickson the scheduler spoke to pt with date and time for 1 dose of feraheme

## 2018-05-14 NOTE — Telephone Encounter (Signed)
Called pt and left message. I know that Evan [name] had already spoke to him last week but I noticed that Dr. Janese Banks sent another message to [place] that suggested that he get 1 dose of iron even though his hgb is a little better that last time. I asked that he call me back and left my phone number.

## 2018-05-14 NOTE — Addendum Note (Signed)
Addended by: Luella Cook on: 05/14/2018 05:04 PM   Modules accepted: Orders

## 2018-05-14 NOTE — Telephone Encounter (Signed)
-----   Message from Sindy Guadeloupe, MD sent at 05/08/2018  4:47 PM EDT ----- Ferritin is still low althoguh anemia a lot better. Let us repeat cbc ferritin and iron studies in 1 month as well. If ferritin still low, will give more IV iron. Thanks, Astrid Divine

## 2018-05-15 ENCOUNTER — Ambulatory Visit: Payer: Medicare HMO

## 2018-05-16 ENCOUNTER — Ambulatory Visit: Payer: Medicare HMO

## 2018-05-22 ENCOUNTER — Inpatient Hospital Stay: Payer: Medicare HMO

## 2018-05-22 VITALS — BP 143/95 | HR 73 | Temp 98.1°F | Resp 18

## 2018-05-22 DIAGNOSIS — D500 Iron deficiency anemia secondary to blood loss (chronic): Secondary | ICD-10-CM

## 2018-05-22 MED ORDER — FERUMOXYTOL INJECTION 510 MG/17 ML
510.0000 mg | Freq: Once | INTRAVENOUS | Status: AC
  Administered 2018-05-22: 510 mg via INTRAVENOUS
  Filled 2018-05-22: qty 17

## 2018-06-06 ENCOUNTER — Inpatient Hospital Stay: Payer: Medicare HMO | Attending: Oncology

## 2018-06-06 DIAGNOSIS — D500 Iron deficiency anemia secondary to blood loss (chronic): Secondary | ICD-10-CM | POA: Diagnosis present

## 2018-06-06 DIAGNOSIS — K922 Gastrointestinal hemorrhage, unspecified: Secondary | ICD-10-CM | POA: Diagnosis present

## 2018-06-06 LAB — CBC WITH DIFFERENTIAL/PLATELET
BASOS ABS: 0.1 10*3/uL (ref 0–0.1)
Basophils Relative: 1 %
Eosinophils Absolute: 0.1 10*3/uL (ref 0–0.7)
Eosinophils Relative: 2 %
HEMATOCRIT: 46.9 % (ref 40.0–52.0)
HEMOGLOBIN: 15.6 g/dL (ref 13.0–18.0)
LYMPHS PCT: 34 %
Lymphs Abs: 2.7 10*3/uL (ref 1.0–3.6)
MCH: 27.4 pg (ref 26.0–34.0)
MCHC: 33.3 g/dL (ref 32.0–36.0)
MCV: 82.4 fL (ref 80.0–100.0)
MONO ABS: 0.8 10*3/uL (ref 0.2–1.0)
Monocytes Relative: 11 %
NEUTROS ABS: 4.1 10*3/uL (ref 1.4–6.5)
Neutrophils Relative %: 52 %
Platelets: 271 10*3/uL (ref 150–440)
RBC: 5.69 MIL/uL (ref 4.40–5.90)
RDW: 15.2 % — ABNORMAL HIGH (ref 11.5–14.5)
WBC: 7.9 10*3/uL (ref 3.8–10.6)

## 2018-06-06 LAB — IRON AND TIBC
IRON: 86 ug/dL (ref 45–182)
Saturation Ratios: 20 % (ref 17.9–39.5)
TIBC: 423 ug/dL (ref 250–450)
UIBC: 337 ug/dL

## 2018-06-06 LAB — FERRITIN: FERRITIN: 105 ng/mL (ref 24–336)

## 2018-06-09 ENCOUNTER — Ambulatory Visit: Payer: Medicare HMO | Admitting: Gastroenterology

## 2018-06-23 ENCOUNTER — Encounter: Payer: Self-pay | Admitting: Gastroenterology

## 2018-06-23 ENCOUNTER — Ambulatory Visit: Payer: Medicare HMO | Admitting: Gastroenterology

## 2018-06-23 VITALS — BP 119/78 | HR 69 | Ht 75.0 in | Wt 286.4 lb

## 2018-06-23 DIAGNOSIS — K5730 Diverticulosis of large intestine without perforation or abscess without bleeding: Secondary | ICD-10-CM | POA: Diagnosis not present

## 2018-06-23 NOTE — Progress Notes (Signed)
Evan Antigua, MD [address]  [place]  [place], [place] [address]  Main: [phone]  Fax: [phone]   Primary Care Physician: Ezequiel Kayser, MD  Primary Gastroenterologist:  Dr. Vonda [name]  Chief Complaint  Patient presents with  . Follow-up    3 month f/u, hx recurrent diverticular bleeding    HPI: Cas Tracz is a 73 y.o. male with previous history of partial colectomy in January 2019, due to diverticular bleeding, here for follow-up of recurrent diverticular bleeding.  Last episode of diverticular bleeding was in February 2019.  He was admitted twice within the same month for this.  Denies any bright red blood per rectum since hospital discharge.  Taking MiraLAX daily, and has soft stool daily without straining.  No abdominal pain.  No weight loss.  No nausea or vomiting.  Is seeing hematology, and receiving IV iron.  Last hemoglobin was 15.6, as compared to February 2019 hemoglobin of 9.4 in the hospital admission.  Last ferritin on June 7 was 105, and is being followed by Dr. Janese Banks from hematology.  Last evaluated by surgery, Dr. Hampton Abbot in March 2019, and as per his note:  "Discussed with the patient that at this point, with a negative nuclear scan, and two colonoscopies not revealing active bleeding or a clear source, there is no surgical intervention needed.  Did discuss with the patient that without a clear target, he would require a subtotal colectomy with ileorectal anastomosis, which would be quite a significant surgical procedure to undertake and would be lifestyle changing as he would likely have 5+ bowel movements per day.  Discussed with patient that he should continue taking his MiraLax and avoid constipation or straining. This may have contributed to his bleeding episode. "  Previous history: Last colonoscopy was Feb 25th 2019 Findings:      The perianal and digital rectal examinations were normal.      The terminal Ileum was seen but  could not be intubated due to looping of       the scope despite abdominal pressure. The brief view of the terminal       ileum did not show any blood in the terminal ileum.      Multiple small and large-mouthed diverticula were found in the sigmoid       colon and ascending colon. These were not bleeding.      The retroflexed view of the distal rectum and anal verge was normal and       showed no anal or rectal abnormalities.      No evidence of old or new blood was seen throughout the exam. The stool       was yellow to green in color with no black stool or red blood present. Impression:           - Diverticulosis in the sigmoid colon and in the                        ascending colon.                       - The distal rectum and anal verge are normal on                        retroflexion view.                       -  No evidence of old or new blood was seen throughout                        the exam. The stool was yellow to green in color with                        no black stool or red blood present.                       - No specimens collected.                       - Clinical history and findings are consistent with                        hematochezia on presentation being from diverticular                        bleeding. Pt. has had no further hematochezia yesterday                        with the prep and no blood is seen in the exam today,                        and Hgb has been stable around 8-9 since yesterday                        morning, consistent with resolved diverticular bleed.   Last EGD Feb 23 2018: Findings:      The examined esophagus was normal.      There is no endoscopic evidence of bleeding in the entire esophagus.      The entire examined stomach was normal.      There is no endoscopic evidence of bleeding or ulceration in the entire       examined stomach.      The examined duodenum was normal.      There is no endoscopic evidence of bleeding in the  entire examined       duodenum. Impression:           - Normal esophagus.                       - Normal stomach.                       - Normal examined duodenum.    He has had previous history of partial colectomy in January 2018.  Colonoscopy on February 18, 2018 by Dr. Allen Norris showed previous anastomosis site.  Diverticulosis was noted.  No actively late bleeding lesions were reported, however colonoscopy report states more blood was seen in the left than the right colon.  Bleeding scan was recommended if patient continued to have further bleeding.    Patient had diverticular bleed about 3-4 years ago, and required embolization at that time.  He presented in December 2017 with amount of and underwent angiogram with embolization of the sigmoid colon, but had recurrence of bleeding during that admission.  He underwent partial colectomy and exploratory laparotomy on January 01, 2017.  Sigmoid colon distal descending colon was removed.  He returned on January 08, 2018 due to evisceration from exploratory laparotomy site, and was  taken for emergency surgery for wound evisceration.  He underwent colonoscopy by Dr. Vira Agar in September 2018, 1 rectal polyp was removed, and an isolated ulcer was seen in the descending colon.  Biopsies showed polypoid colonic mucosa with fibrosis, inflammation of the lamina propria.    Current Outpatient Medications  Medication Sig Dispense Refill  . pantoprazole (PROTONIX) 40 MG tablet Take 1 tablet (40 mg total) by mouth daily. 30 tablet 0  . acetaminophen (TYLENOL) 500 MG tablet Take 1,000 mg by mouth every 6 (six) hours as needed.    [name] amLODipine (NORVASC) 5 MG tablet Take 5 mg by mouth daily.    [name] aspirin EC 81 MG EC tablet Take 1 tablet (81 mg total) by mouth daily. 30 tablet 5  . Cholecalciferol (VITAMIN D3) 2000 units capsule Take 2,000 Units by mouth daily.    . dorzolamide-timolol (COSOPT) 22.3-6.8 MG/ML ophthalmic solution Apply 1 drop to eye 2 (two) times  daily.    [name] latanoprost (XALATAN) 0.005 % ophthalmic solution Apply 1 drop to eye at bedtime.    . Melatonin 10 MG TABS Take by mouth.    . metFORMIN (GLUCOPHAGE-XR) 500 MG 24 hr tablet 1,000 mg daily with breakfast.     . Multiple Vitamins-Minerals (CENTRUM SILVER PO) Take by mouth every morning.    . Omega-3 Fatty Acids (FISH OIL) 1200 MG CAPS Take 2 capsules by mouth daily.    . polyethylene glycol (MIRALAX / GLYCOLAX) packet Take 17 g by mouth daily. 14 each 0  . pravastatin (PRAVACHOL) 40 MG tablet Take 40 mg by mouth daily.    [name] SHINGRIX injection      No current facility-administered medications for this visit.     Allergies as of 06/23/2018 - Review Complete 06/23/2018  Allergen Reaction Noted  . Azithromycin Rash 07/09/2016  . Penicillins Rash 07/09/2016  . Lisinopril Cough 07/09/2016  . Losartan Cough 07/09/2016  . Metoprolol Other (See Comments) 07/09/2016    ROS:  General: Negative for anorexia, weight loss, fever, chills, fatigue, weakness. ENT: Negative for hoarseness, difficulty swallowing , nasal congestion. CV: Negative for chest pain, angina, palpitations, dyspnea on exertion, peripheral edema.  Respiratory: Negative for dyspnea at rest, dyspnea on exertion, cough, sputum, wheezing.  GI: See history of present illness. GU:  Negative for dysuria, hematuria, urinary incontinence, urinary frequency, nocturnal urination.  Endo: Negative for unusual weight change.    Physical Examination:   BP 119/78   Pulse 69   Ht 6\' 3"  (1.905 m)   Wt 286 lb 6.4 oz (129.9 kg)   BMI 35.80 kg/m   General: Well-nourished, well-developed in no acute distress.  Eyes: No icterus. Conjunctivae pink. Mouth: Oropharyngeal mucosa moist and pink , no lesions erythema or exudate. Neck: Supple, Trachea midline Abdomen: Bowel sounds are normal, nontender, nondistended, no hepatosplenomegaly or masses, no abdominal bruits or hernia , no rebound or guarding.   Extremities: No lower  extremity edema. No clubbing or deformities. Neuro: Alert and oriented x 3.  Grossly intact. Skin: Warm and dry, no jaundice.   Psych: Alert and cooperative, normal mood and affect.   Labs: CMP     Component Value Date/Time   NA 139 02/22/2018 0510   NA 136 03/26/2014 0418   K 3.9 02/22/2018 0510   K 4.0 03/26/2014 0418   CL 105 02/22/2018 0510   CL 102 03/26/2014 0418   CO2 25 02/22/2018 0510   CO2 34 (H) 03/26/2014 0418   GLUCOSE 284 (H) 02/22/2018 0510  GLUCOSE 114 (H) 03/26/2014 0418   BUN 15 02/22/2018 0510   BUN 8 03/26/2014 0418   CREATININE 0.94 02/22/2018 0510   CREATININE 0.90 03/26/2014 0418   CALCIUM 8.8 (L) 02/22/2018 0510   CALCIUM 9.2 03/26/2014 0418   PROT 5.9 (L) 02/22/2018 0510   PROT 6.3 (L) 10/04/2013 0533   ALBUMIN 3.4 (L) 02/22/2018 0510   ALBUMIN 3.0 (L) 10/04/2013 0533   AST 20 02/22/2018 0510   AST 8 (L) 10/04/2013 0533   ALT 18 02/22/2018 0510   ALT 17 10/04/2013 0533   ALKPHOS 64 02/22/2018 0510   ALKPHOS 92 10/04/2013 0533   BILITOT 0.8 02/22/2018 0510   BILITOT 1.7 (H) 10/04/2013 0533   GFRNONAA >60 02/22/2018 0510   GFRNONAA >60 03/26/2014 0418   GFRAA >60 02/22/2018 0510   GFRAA >60 03/26/2014 0418   Lab Results  Component Value Date   WBC 7.9 06/06/2018   HGB 15.6 06/06/2018   HCT 46.9 06/06/2018   MCV 82.4 06/06/2018   PLT 271 06/06/2018    Imaging Studies: No results found.  Assessment and Plan:   Rayshard Schirtzinger is a 73 y.o. y/o male with previous history of recurrent diverticular bleeding, with most recent episode being in February 2019, and previous history of partial colectomy in 2018 due to persistent GI bleeding from diverticulosis at the time, currently asymptomatic since hospital discharge in February 2019, following with hematology for IV iron replacement  Patient extensively educated on maintaining soft stool He is taking MiraLAX daily, and reports soft stool without straining Also eating high-fiber diet If he  develops constipation, he was asked to call us, as bowel regimen can be changed at that time He will continue to follow-up with primary care and hematology for evaluation for any further IV iron transfusions that he might need He verbalized understanding of this He has already been evaluated by Dr. Hampton Abbot from surgery, please see his note  Follow-up with GI as needed Follow-up closely with primary care provider as scheduled   Dr Evan [name]

## 2018-08-07 ENCOUNTER — Other Ambulatory Visit: Payer: Self-pay

## 2018-08-07 ENCOUNTER — Inpatient Hospital Stay: Payer: Medicare HMO | Attending: Oncology

## 2018-08-07 DIAGNOSIS — Z87891 Personal history of nicotine dependence: Secondary | ICD-10-CM | POA: Diagnosis not present

## 2018-08-07 DIAGNOSIS — D500 Iron deficiency anemia secondary to blood loss (chronic): Secondary | ICD-10-CM | POA: Diagnosis present

## 2018-08-07 DIAGNOSIS — K922 Gastrointestinal hemorrhage, unspecified: Secondary | ICD-10-CM | POA: Diagnosis not present

## 2018-08-07 LAB — CBC WITH DIFFERENTIAL/PLATELET
Basophils Absolute: 0.1 10*3/uL (ref 0–0.1)
Basophils Relative: 1 %
EOS ABS: 0.1 10*3/uL (ref 0–0.7)
EOS PCT: 1 %
HCT: 48.3 % (ref 40.0–52.0)
Hemoglobin: 16.2 g/dL (ref 13.0–18.0)
LYMPHS ABS: 2.4 10*3/uL (ref 1.0–3.6)
Lymphocytes Relative: 30 %
MCH: 28.3 pg (ref 26.0–34.0)
MCHC: 33.5 g/dL (ref 32.0–36.0)
MCV: 84.5 fL (ref 80.0–100.0)
MONOS PCT: 9 %
Monocytes Absolute: 0.7 10*3/uL (ref 0.2–1.0)
Neutro Abs: 4.9 10*3/uL (ref 1.4–6.5)
Neutrophils Relative %: 59 %
PLATELETS: 277 10*3/uL (ref 150–440)
RBC: 5.71 MIL/uL (ref 4.40–5.90)
RDW: 16.9 % — ABNORMAL HIGH (ref 11.5–14.5)
WBC: 8.2 10*3/uL (ref 3.8–10.6)

## 2018-08-07 LAB — IRON AND TIBC
IRON: 88 ug/dL (ref 45–182)
Saturation Ratios: 22 % (ref 17.9–39.5)
TIBC: 407 ug/dL (ref 250–450)
UIBC: 319 ug/dL

## 2018-08-07 LAB — FERRITIN: Ferritin: 42 ng/mL (ref 24–336)

## 2018-08-08 ENCOUNTER — Inpatient Hospital Stay: Payer: Medicare HMO

## 2018-11-07 ENCOUNTER — Other Ambulatory Visit: Payer: Self-pay

## 2018-11-07 DIAGNOSIS — D500 Iron deficiency anemia secondary to blood loss (chronic): Secondary | ICD-10-CM

## 2018-11-11 ENCOUNTER — Inpatient Hospital Stay: Payer: Medicare HMO

## 2018-11-11 ENCOUNTER — Encounter: Payer: Self-pay | Admitting: Oncology

## 2018-11-11 ENCOUNTER — Other Ambulatory Visit: Payer: Self-pay

## 2018-11-11 ENCOUNTER — Inpatient Hospital Stay: Payer: Medicare HMO | Attending: Oncology | Admitting: Oncology

## 2018-11-11 VITALS — BP 139/83 | HR 93 | Temp 97.4°F | Resp 18 | Ht 75.0 in | Wt 290.4 lb

## 2018-11-11 DIAGNOSIS — Z79899 Other long term (current) drug therapy: Secondary | ICD-10-CM | POA: Insufficient documentation

## 2018-11-11 DIAGNOSIS — D500 Iron deficiency anemia secondary to blood loss (chronic): Secondary | ICD-10-CM

## 2018-11-11 DIAGNOSIS — K5791 Diverticulosis of intestine, part unspecified, without perforation or abscess with bleeding: Secondary | ICD-10-CM | POA: Diagnosis not present

## 2018-11-11 LAB — IRON AND TIBC
Iron: 89 ug/dL (ref 45–182)
SATURATION RATIOS: 22 % (ref 17.9–39.5)
TIBC: 405 ug/dL (ref 250–450)
UIBC: 316 ug/dL

## 2018-11-11 LAB — FERRITIN: Ferritin: 41 ng/mL (ref 24–336)

## 2018-11-11 LAB — CBC WITH DIFFERENTIAL/PLATELET
Abs Immature Granulocytes: 0.05 10*3/uL (ref 0.00–0.07)
BASOS ABS: 0.1 10*3/uL (ref 0.0–0.1)
Basophils Relative: 1 %
EOS ABS: 0.1 10*3/uL (ref 0.0–0.5)
EOS PCT: 1 %
HCT: 51.1 % (ref 39.0–52.0)
HEMOGLOBIN: 16.3 g/dL (ref 13.0–17.0)
Immature Granulocytes: 1 %
LYMPHS ABS: 2.5 10*3/uL (ref 0.7–4.0)
LYMPHS PCT: 31 %
MCH: 27.3 pg (ref 26.0–34.0)
MCHC: 31.9 g/dL (ref 30.0–36.0)
MCV: 85.7 fL (ref 80.0–100.0)
Monocytes Absolute: 0.8 10*3/uL (ref 0.1–1.0)
Monocytes Relative: 10 %
Neutro Abs: 4.5 10*3/uL (ref 1.7–7.7)
Neutrophils Relative %: 56 %
Platelets: 252 10*3/uL (ref 150–400)
RBC: 5.96 MIL/uL — ABNORMAL HIGH (ref 4.22–5.81)
RDW: 13.2 % (ref 11.5–15.5)
WBC: 8 10*3/uL (ref 4.0–10.5)
nRBC: 0 % (ref 0.0–0.2)

## 2018-11-11 NOTE — Progress Notes (Signed)
No new changes noted today 

## 2018-11-11 NOTE — Progress Notes (Signed)
Hematology/Oncology Consult note [facility]  [phone] [phone]  Patient Care Team: Ezequiel Kayser, MD as PCP - General (Internal Medicine)   Name of the patient: Evan [name]  [id number]  10-09-1945   Date of visit: 11/11/18  Diagnosis- iron deficiency anemia likely due to intermittent diverticular bleeding  Chief complaint/ Reason for visit-routine follow-up of iron deficiency anemia  Heme/Onc history: patient is a 73 year old African-American male with a prior history of diverticulosis and diverticular bleed status post partial colectomy in January 2018. He presented with symptoms of hematochezia to the hospital in February 2019 and underwent colonoscopy by Dr. Allen Norris which showed evidence of diverticulosis but no active diverticular bleed. There was some blood noted in the left colon. This was followed by another colonoscopy on 02/24/2018 which again did not show any evidence of active bleeding. Diverticulosis noted. He also had an upper endoscopy which was unremarkable. The cause of his GI bleed was attributed to intermittent self-limited diverticular bleeding. Patient has not had a capsule endoscopy yet. He has been referred to [place] for iron deficiency anemia.  Patient denies any consistent use of NSAIDs. He has been on oral iron twice a day for the last 2 years. He denies any fatigue or unintentional weight loss. Denies any blood in his urine. Patient's hemoglobin was around 7-8 in January 2018 when he had the initial episode of diverticular bleed. In February 2019 his hemoglobin was 12.4 and had been gradually trending down to 9. On 02/23/2018 he suddenly dropped his hemoglobin to 5.6 and required blood transfusion at that time. Most recent CBC from 02/25/2018 showed white count of 10.7, H&H of 9.4/27.6 and a platelet count of 272. B12 levels last checked in May 2018 was 378. Iron studies on 02/27/2018 were as follows: Ferritin  was low at 22, serum iron low at 25. TIBC was high at 450. Patient denies any hematochezia over the last 5-6 days   Interval history-patient feels well and denies any complaints today.  Denies any blood in stool or urine.  Denies any dark melanotic stools.  ECOG PS- 0 Pain scale- 0 Opioid associated constipation- no  Review of systems- Review of Systems  Constitutional: Negative for chills, fever, malaise/fatigue and weight loss.  HENT: Negative for congestion, ear discharge and nosebleeds.   Eyes: Negative for blurred vision.  Respiratory: Negative for cough, hemoptysis, sputum production, shortness of breath and wheezing.   Cardiovascular: Negative for chest pain, palpitations, orthopnea and claudication.  Gastrointestinal: Negative for abdominal pain, blood in stool, constipation, diarrhea, heartburn, melena, nausea and vomiting.  Genitourinary: Negative for dysuria, flank pain, frequency, hematuria and urgency.  Musculoskeletal: Negative for back pain, joint pain and myalgias.  Skin: Negative for rash.  Neurological: Negative for dizziness, tingling, focal weakness, seizures, weakness and headaches.  Endo/Heme/Allergies: Does not bruise/bleed easily.  Psychiatric/Behavioral: Negative for depression and suicidal ideas. The patient does not have insomnia.     Allergies  Allergen Reactions  . Azithromycin Rash  . Penicillins Rash    Has patient had a PCN reaction causing immediate rash, facial/tongue/throat swelling, SOB or lightheadedness with hypotension: Yes Has patient had a PCN reaction causing severe rash involving mucus membranes or skin necrosis: Yes Has patient had a PCN reaction that required hospitalization No Has patient had a PCN reaction occurring within the last 10 years: No If all of the above answers are "NO", then may proceed with Cephalosporin use. Has patient had a PCN reaction causing immediate rash, facial/tongue/throat  swelling, SOB or lightheadedness with  hypotension: Yes Has patient had a PCN reaction causing severe rash involving mucus membranes or skin necrosis: Yes Has patient had a PCN reaction that required hospitalization No Has patient had a PCN reaction occurring within the last 10 years: No If all of the above answers are "NO", then may proceed with Cephalosporin use.   [name] Lisinopril Cough  . Losartan Cough  . Metoprolol Other (See Comments)    Bradycardia.     Past Medical History:  Diagnosis Date  . Arthritis    xDJD  . Atrial fibrillation ([place])   . BPH (benign prostatic hyperplasia)   . CHF (congestive heart failure) ([place])   . Diabetes mellitus without complication ([place])   . Discitis   . Diverticulitis   . Diverticulosis   . DVT (deep venous thrombosis) ([place])   . Erectile dysfunction   . Hearing loss   . Hyperlipidemia   . Hypertension   . Ischemic optic neuropathy   . Lower GI bleeding   . Non-ischemic cardiomyopathy ([place])   . Sleep apnea      Past Surgical History:  Procedure Laterality Date  . ATRIAL ABLATION SURGERY    . CHOLECYSTECTOMY    . COLONOSCOPY Left 02/24/2018   Procedure: COLONOSCOPY;  Surgeon: Virgel Manifold, MD;  Location: [facility] ENDOSCOPY;  Service: Endoscopy;  Laterality: Left;  . COLONOSCOPY WITH PROPOFOL N/A 09/25/2017   Procedure: COLONOSCOPY WITH PROPOFOL;  Surgeon: Manya Silvas, MD;  Location: [facility] ENDOSCOPY;  Service: Endoscopy;  Laterality: N/A;  . COLONOSCOPY WITH PROPOFOL N/A 02/18/2018   Procedure: COLONOSCOPY WITH PROPOFOL;  Surgeon: [name], MD;  Location: [facility] ENDOSCOPY;  Service: Endoscopy;  Laterality: N/A;  . ESOPHAGOGASTRODUODENOSCOPY Left 02/23/2018   Procedure: ESOPHAGOGASTRODUODENOSCOPY (EGD);  Surgeon: Virgel Manifold, MD;  Location: [facility] ENDOSCOPY;  Service: Endoscopy;  Laterality: Left;  . JOINT REPLACEMENT Right    knee  . LAPAROTOMY  01/01/2017   Procedure: EXPLORATORY LAPAROTOMY;  Surgeon: Olean Ree, MD;  Location: [facility] ORS;  Service: General;;    . PARTIAL COLECTOMY N/A 01/01/2017   Procedure: PARTIAL COLECTOMY;  Surgeon: Olean Ree, MD;  Location: [facility] ORS;  Service: General;  Laterality: N/A;  . PERIPHERAL VASCULAR CATHETERIZATION N/A 12/28/2016   Procedure: Visceral Artery Intervention;  Surgeon: Algernon Huxley, MD;  Location: [place] CV LAB;  Service: Cardiovascular;  Laterality: N/A;  . SECONDARY CLOSURE OF WOUND N/A 01/08/2017   Procedure: SECONDARY CLOSURE FOR EVISCERATION;  Surgeon: Florene Glen, MD;  Location: [facility] ORS;  Service: General;  Laterality: N/A;  . SIGMOIDECTOMY    . TOTAL KNEE ARTHROPLASTY Right     Social History   Socioeconomic History  . Marital status: Married    Spouse name: Not on file  . Number of children: Not on file  . Years of education: Not on file  . Highest education level: Not on file  Occupational History  . Not on file  Social Needs  . Financial resource strain: Not on file  . Food insecurity:    Worry: Not on file    Inability: Not on file  . Transportation needs:    Medical: Not on file    Non-medical: Not on file  Tobacco Use  . Smoking status: Former Smoker    Packs/day: 0.25    Years: 29.00    Pack years: 7.25    Types: Cigarettes    Last attempt to quit: 12/31/1993    Years since quitting: 24.8  . Smokeless  tobacco: Never Used  Substance and Sexual Activity  . Alcohol use: No    Alcohol/week: 0.0 standard drinks  . Drug use: No  . Sexual activity: Not on file  Lifestyle  . Physical activity:    Days per week: Not on file    Minutes per session: Not on file  . Stress: Not on file  Relationships  . Social connections:    Talks on phone: Not on file    Gets together: Not on file    Attends religious service: Not on file    Active member of club or organization: Not on file    Attends meetings of clubs or organizations: Not on file    Relationship status: Not on file  . Intimate partner violence:    Fear of current or ex partner: Not on file    Emotionally  abused: Not on file    Physically abused: Not on file    Forced sexual activity: Not on file  Other Topics Concern  . Not on file  Social History Narrative  . Not on file    Family History  Problem Relation Age of Onset  . Diabetes Sister   . Diabetes Brother   . Diabetes Sister   . Diabetes Brother      Current Outpatient Medications:  .  amLODipine (NORVASC) 5 MG tablet, Take 5 mg by mouth daily., Disp: , Rfl:  .  amLODipine (NORVASC) 5 MG tablet, Take by mouth., Disp: , Rfl:  .  aspirin EC 81 MG EC tablet, Take 1 tablet (81 mg total) by mouth daily., Disp: 30 tablet, Rfl: 5 .  Cholecalciferol (VITAMIN D3) 2000 units capsule, Take 2,000 Units by mouth daily., Disp: , Rfl:  .  dorzolamide-timolol (COSOPT) 22.3-6.8 MG/ML ophthalmic solution, Apply 1 drop to eye 2 (two) times daily., Disp: , Rfl:  .  latanoprost (XALATAN) 0.005 % ophthalmic solution, Apply 1 drop to eye at bedtime., Disp: , Rfl:  .  Melatonin 10 MG TABS, Take by mouth., Disp: , Rfl:  .  metFORMIN (GLUCOPHAGE-XR) 500 MG 24 hr tablet, 1,000 mg daily with breakfast. , Disp: , Rfl:  .  metFORMIN (GLUCOPHAGE-XR) 500 MG 24 hr tablet, Take by mouth., Disp: , Rfl:  .  Multiple Vitamins-Minerals (CENTRUM SILVER PO), Take by mouth every morning., Disp: , Rfl:  .  Omega-3 Fatty Acids (FISH OIL) 1200 MG CAPS, Take 2 capsules by mouth daily., Disp: , Rfl:  .  pantoprazole (PROTONIX) 40 MG tablet, Take 1 tablet (40 mg total) by mouth daily., Disp: 30 tablet, Rfl: 0 .  pravastatin (PRAVACHOL) 40 MG tablet, Take 40 mg by mouth daily., Disp: , Rfl:  .  acetaminophen (TYLENOL) 500 MG tablet, Take 1,000 mg by mouth every 6 (six) hours as needed., Disp: , Rfl:  .  polyethylene glycol (MIRALAX / GLYCOLAX) packet, Take 17 g by mouth daily. (Patient not taking: Reported on 11/11/2018), Disp: 14 each, Rfl: 0 .  SHINGRIX injection, , Disp: , Rfl:   Physical exam:  Vitals:   11/11/18 0938  BP: 139/83  Pulse: 93  Resp: 18  Temp: (!)  97.4 F (36.3 C)  TempSrc: Tympanic  SpO2: 96%  Weight: 290 lb 6.4 oz (131.7 kg)  Height: 6\' 3"  (1.905 m)   Physical Exam  Constitutional: He is oriented to person, place, and time. He appears well-developed and well-nourished.  HENT:  Head: Normocephalic and atraumatic.  Eyes: Pupils are equal, round, and reactive to light. EOM are normal.  Neck: Normal range of motion.  Cardiovascular: Normal rate, regular rhythm and normal heart sounds.  Pulmonary/Chest: Effort normal and breath sounds normal.  Abdominal: Soft. Bowel sounds are normal.  Neurological: He is alert and oriented to person, place, and time.  Skin: Skin is warm and dry.     CMP Latest Ref Rng & Units 02/22/2018  Glucose 65 - 99 mg/dL 284(H)  BUN 6 - 20 mg/dL 15  Creatinine 0.61 - 1.24 mg/dL 0.94  Sodium 135 - 145 mmol/L 139  Potassium 3.5 - 5.1 mmol/L 3.9  Chloride 101 - 111 mmol/L 105  CO2 22 - 32 mmol/L 25  Calcium 8.9 - 10.3 mg/dL 8.8(L)  Total Protein 6.5 - 8.1 g/dL 5.9(L)  Total Bilirubin 0.3 - 1.2 mg/dL 0.8  Alkaline Phos 38 - 126 U/L 64  AST 15 - 41 U/L 20  ALT 17 - 63 U/L 18   CBC Latest Ref Rng & Units 11/11/2018  WBC 4.0 - 10.5 K/uL 8.0  Hemoglobin 13.0 - 17.0 g/dL 16.3  Hematocrit 39.0 - 52.0 % 51.1  Platelets 150 - 400 K/uL 252     Assessment and plan- Patient is a 73 y.o. male with iron deficiency anemia likely due to intermittent diverticular bleeding  Patient is not anemic today and his iron studies are normal.  His last IV iron was over 6 months ago.  He does not require IV iron at this time.  Repeat CBC ferritin and iron studies in 3 in 6 months and I will see him back in 6 months   Visit Diagnosis 1. Iron deficiency anemia due to chronic blood loss      Dr. Randa Evens, MD, MPH [facility] [phone] 11/11/2018 12:33 PM

## 2019-02-10 ENCOUNTER — Inpatient Hospital Stay: Payer: Medicare HMO | Attending: Oncology

## 2019-02-10 ENCOUNTER — Other Ambulatory Visit: Payer: Self-pay

## 2019-02-10 DIAGNOSIS — D500 Iron deficiency anemia secondary to blood loss (chronic): Secondary | ICD-10-CM | POA: Insufficient documentation

## 2019-02-10 DIAGNOSIS — K5791 Diverticulosis of intestine, part unspecified, without perforation or abscess with bleeding: Secondary | ICD-10-CM | POA: Diagnosis not present

## 2019-02-10 DIAGNOSIS — Z79899 Other long term (current) drug therapy: Secondary | ICD-10-CM | POA: Diagnosis not present

## 2019-02-10 LAB — CBC
HCT: 47.4 % (ref 39.0–52.0)
Hemoglobin: 15 g/dL (ref 13.0–17.0)
MCH: 27.4 pg (ref 26.0–34.0)
MCHC: 31.6 g/dL (ref 30.0–36.0)
MCV: 86.5 fL (ref 80.0–100.0)
Platelets: 274 10*3/uL (ref 150–400)
RBC: 5.48 MIL/uL (ref 4.22–5.81)
RDW: 13.7 % (ref 11.5–15.5)
WBC: 7 10*3/uL (ref 4.0–10.5)
nRBC: 0 % (ref 0.0–0.2)

## 2019-02-10 LAB — FERRITIN: Ferritin: 118 ng/mL (ref 24–336)

## 2019-02-10 LAB — IRON AND TIBC
IRON: 69 ug/dL (ref 45–182)
SATURATION RATIOS: 18 % (ref 17.9–39.5)
TIBC: 375 ug/dL (ref 250–450)
UIBC: 306 ug/dL

## 2019-05-12 ENCOUNTER — Ambulatory Visit: Payer: Medicare HMO | Admitting: Oncology

## 2019-05-12 ENCOUNTER — Inpatient Hospital Stay: Payer: Medicare HMO | Attending: Oncology

## 2019-05-12 ENCOUNTER — Other Ambulatory Visit: Payer: Self-pay

## 2019-05-12 DIAGNOSIS — Z86718 Personal history of other venous thrombosis and embolism: Secondary | ICD-10-CM | POA: Insufficient documentation

## 2019-05-12 DIAGNOSIS — I4891 Unspecified atrial fibrillation: Secondary | ICD-10-CM | POA: Insufficient documentation

## 2019-05-12 DIAGNOSIS — I110 Hypertensive heart disease with heart failure: Secondary | ICD-10-CM | POA: Insufficient documentation

## 2019-05-12 DIAGNOSIS — Z888 Allergy status to other drugs, medicaments and biological substances status: Secondary | ICD-10-CM | POA: Diagnosis not present

## 2019-05-12 DIAGNOSIS — Z833 Family history of diabetes mellitus: Secondary | ICD-10-CM | POA: Insufficient documentation

## 2019-05-12 DIAGNOSIS — Z7984 Long term (current) use of oral hypoglycemic drugs: Secondary | ICD-10-CM | POA: Insufficient documentation

## 2019-05-12 DIAGNOSIS — Z880 Allergy status to penicillin: Secondary | ICD-10-CM | POA: Diagnosis not present

## 2019-05-12 DIAGNOSIS — E119 Type 2 diabetes mellitus without complications: Secondary | ICD-10-CM | POA: Diagnosis not present

## 2019-05-12 DIAGNOSIS — Z881 Allergy status to other antibiotic agents status: Secondary | ICD-10-CM | POA: Diagnosis not present

## 2019-05-12 DIAGNOSIS — Z7982 Long term (current) use of aspirin: Secondary | ICD-10-CM | POA: Diagnosis not present

## 2019-05-12 DIAGNOSIS — I428 Other cardiomyopathies: Secondary | ICD-10-CM | POA: Insufficient documentation

## 2019-05-12 DIAGNOSIS — G4730 Sleep apnea, unspecified: Secondary | ICD-10-CM | POA: Insufficient documentation

## 2019-05-12 DIAGNOSIS — D509 Iron deficiency anemia, unspecified: Secondary | ICD-10-CM | POA: Insufficient documentation

## 2019-05-12 DIAGNOSIS — Z87891 Personal history of nicotine dependence: Secondary | ICD-10-CM | POA: Diagnosis not present

## 2019-05-12 DIAGNOSIS — I509 Heart failure, unspecified: Secondary | ICD-10-CM | POA: Diagnosis not present

## 2019-05-12 DIAGNOSIS — Z79899 Other long term (current) drug therapy: Secondary | ICD-10-CM | POA: Insufficient documentation

## 2019-05-12 DIAGNOSIS — D500 Iron deficiency anemia secondary to blood loss (chronic): Secondary | ICD-10-CM

## 2019-05-12 DIAGNOSIS — Z9049 Acquired absence of other specified parts of digestive tract: Secondary | ICD-10-CM | POA: Insufficient documentation

## 2019-05-12 DIAGNOSIS — Z96651 Presence of right artificial knee joint: Secondary | ICD-10-CM | POA: Insufficient documentation

## 2019-05-12 DIAGNOSIS — E785 Hyperlipidemia, unspecified: Secondary | ICD-10-CM | POA: Insufficient documentation

## 2019-05-12 DIAGNOSIS — M1990 Unspecified osteoarthritis, unspecified site: Secondary | ICD-10-CM | POA: Insufficient documentation

## 2019-05-12 DIAGNOSIS — H47019 Ischemic optic neuropathy, unspecified eye: Secondary | ICD-10-CM | POA: Diagnosis not present

## 2019-05-12 LAB — CBC
HCT: 49.4 % (ref 39.0–52.0)
Hemoglobin: 15.8 g/dL (ref 13.0–17.0)
MCH: 27.8 pg (ref 26.0–34.0)
MCHC: 32 g/dL (ref 30.0–36.0)
MCV: 87 fL (ref 80.0–100.0)
Platelets: 251 10*3/uL (ref 150–400)
RBC: 5.68 MIL/uL (ref 4.22–5.81)
RDW: 13.8 % (ref 11.5–15.5)
WBC: 7.1 10*3/uL (ref 4.0–10.5)
nRBC: 0 % (ref 0.0–0.2)

## 2019-05-12 LAB — IRON AND TIBC
Iron: 121 ug/dL (ref 45–182)
Saturation Ratios: 29 % (ref 17.9–39.5)
TIBC: 425 ug/dL (ref 250–450)
UIBC: 304 ug/dL

## 2019-05-12 LAB — FERRITIN: Ferritin: 57 ng/mL (ref 24–336)

## 2019-05-13 ENCOUNTER — Encounter: Payer: Self-pay | Admitting: Oncology

## 2019-05-13 ENCOUNTER — Inpatient Hospital Stay (HOSPITAL_BASED_OUTPATIENT_CLINIC_OR_DEPARTMENT_OTHER): Payer: Medicare HMO | Admitting: Oncology

## 2019-05-13 DIAGNOSIS — D509 Iron deficiency anemia, unspecified: Secondary | ICD-10-CM

## 2019-05-13 NOTE — Progress Notes (Signed)
I connected with Evan [name] on 05/13/19 at 10:30 AM EDT by telephone visit and verified that I am speaking with the correct person using two identifiers.   I discussed the limitations, risks, security and privacy concerns of performing an evaluation and management service by telemedicine and the availability of in-person appointments. I also discussed with the patient that there may be a patient responsible charge related to this service. The patient expressed understanding and agreed to proceed.  Other persons participating in the visit and their role in the encounter:  none  Patient's location:  home Provider's location:  [facility]  Chief Complaint: Routine follow-up of iron deficiency anemia  History of present illness: patient is a 74 year old African-American male with a prior history of diverticulosis and diverticular bleed status post partial colectomy in January 2018. He presented with symptoms of hematochezia to the hospital in February 2019 and underwent colonoscopy by Dr. Allen Norris which showed evidence of diverticulosis but no active diverticular bleed. There was some blood noted in the left colon. This was followed by another colonoscopy on 02/24/2018 which again did not show any evidence of active bleeding. Diverticulosis noted. He also had an upper endoscopy which was unremarkable. The cause of his GI bleed was attributed to intermittent self-limited diverticular bleeding. Patient has not had a capsule endoscopy yet. He has been referred to [place] for iron deficiency anemia.  Patient denies any consistent use of NSAIDs. He has been on oral iron twice a day for the last 2 years. He denies any fatigue or unintentional weight loss. Denies any blood in his urine. Patient's hemoglobin was around 7-8 in January 2018 when he had the initial episode of diverticular bleed. In February 2019 his hemoglobin was 12.4 and had been gradually trending down to 9. On 02/23/2018 he suddenly  dropped his hemoglobin to 5.6 and required blood transfusion at that time. Most recent CBC from 02/25/2018 showed white count of 10.7, H&H of 9.4/27.6 and a platelet count of 272. B12 levels last checked in May 2018 was 378. Iron studies on 02/27/2018 were as follows: Ferritin was low at 22, serum iron low at 25. TIBC was high at 450. Patient denies any hematochezia over the last 5-6 days  Interval history: He feels well overall.  Denies any bleeding in his stool or urine.  Denies any unintentional weight loss.  He has been having some loose stools because of use of MiraLAX and he plans to cut back on his dose   Review of Systems  Constitutional: Negative for chills, fever, malaise/fatigue and weight loss.  HENT: Negative for congestion, ear discharge and nosebleeds.   Eyes: Negative for blurred vision.  Respiratory: Negative for cough, hemoptysis, sputum production, shortness of breath and wheezing.   Cardiovascular: Negative for chest pain, palpitations, orthopnea and claudication.  Gastrointestinal: Negative for abdominal pain, blood in stool, constipation, diarrhea, heartburn, melena, nausea and vomiting.  Genitourinary: Negative for dysuria, flank pain, frequency, hematuria and urgency.  Musculoskeletal: Negative for back pain, joint pain and myalgias.  Skin: Negative for rash.  Neurological: Negative for dizziness, tingling, focal weakness, seizures, weakness and headaches.  Endo/Heme/Allergies: Does not bruise/bleed easily.  Psychiatric/Behavioral: Negative for depression and suicidal ideas. The patient does not have insomnia.     Allergies  Allergen Reactions  . Azithromycin Rash  . Penicillins Rash    Has patient had a PCN reaction causing immediate rash, facial/tongue/throat swelling, SOB or lightheadedness with hypotension: Yes Has patient had a PCN reaction causing severe rash  involving mucus membranes or skin necrosis: Yes Has patient had a PCN reaction that required  hospitalization No Has patient had a PCN reaction occurring within the last 10 years: No If all of the above answers are "NO", then may proceed with Cephalosporin use. Has patient had a PCN reaction causing immediate rash, facial/tongue/throat swelling, SOB or lightheadedness with hypotension: Yes Has patient had a PCN reaction causing severe rash involving mucus membranes or skin necrosis: Yes Has patient had a PCN reaction that required hospitalization No Has patient had a PCN reaction occurring within the last 10 years: No If all of the above answers are "NO", then may proceed with Cephalosporin use.   [name] Lisinopril Cough  . Losartan Cough  . Metoprolol Other (See Comments)    Bradycardia.    Past Medical History:  Diagnosis Date  . Arthritis    xDJD  . Atrial fibrillation ([place])   . BPH (benign prostatic hyperplasia)   . CHF (congestive heart failure) ([place])   . Diabetes mellitus without complication ([place])   . Discitis   . Diverticulitis   . Diverticulosis   . DVT (deep venous thrombosis) ([place])   . Erectile dysfunction   . Hearing loss   . Hyperlipidemia   . Hypertension   . Ischemic optic neuropathy   . Lower GI bleeding   . Non-ischemic cardiomyopathy ([place])   . Sleep apnea     Past Surgical History:  Procedure Laterality Date  . ATRIAL ABLATION SURGERY    . CHOLECYSTECTOMY    . COLONOSCOPY Left 02/24/2018   Procedure: COLONOSCOPY;  Surgeon: Virgel Manifold, MD;  Location: [facility] ENDOSCOPY;  Service: Endoscopy;  Laterality: Left;  . COLONOSCOPY WITH PROPOFOL N/A 09/25/2017   Procedure: COLONOSCOPY WITH PROPOFOL;  Surgeon: Manya Silvas, MD;  Location: [facility] ENDOSCOPY;  Service: Endoscopy;  Laterality: N/A;  . COLONOSCOPY WITH PROPOFOL N/A 02/18/2018   Procedure: COLONOSCOPY WITH PROPOFOL;  Surgeon: [name], MD;  Location: [facility] ENDOSCOPY;  Service: Endoscopy;  Laterality: N/A;  . ESOPHAGOGASTRODUODENOSCOPY Left 02/23/2018   Procedure: ESOPHAGOGASTRODUODENOSCOPY  (EGD);  Surgeon: Virgel Manifold, MD;  Location: [facility] ENDOSCOPY;  Service: Endoscopy;  Laterality: Left;  . JOINT REPLACEMENT Right    knee  . LAPAROTOMY  01/01/2017   Procedure: EXPLORATORY LAPAROTOMY;  Surgeon: Olean Ree, MD;  Location: [facility] ORS;  Service: General;;  . PARTIAL COLECTOMY N/A 01/01/2017   Procedure: PARTIAL COLECTOMY;  Surgeon: Olean Ree, MD;  Location: [facility] ORS;  Service: General;  Laterality: N/A;  . PERIPHERAL VASCULAR CATHETERIZATION N/A 12/28/2016   Procedure: Visceral Artery Intervention;  Surgeon: Algernon Huxley, MD;  Location: [place] CV LAB;  Service: Cardiovascular;  Laterality: N/A;  . SECONDARY CLOSURE OF WOUND N/A 01/08/2017   Procedure: SECONDARY CLOSURE FOR EVISCERATION;  Surgeon: Florene Glen, MD;  Location: [facility] ORS;  Service: General;  Laterality: N/A;  . SIGMOIDECTOMY    . TOTAL KNEE ARTHROPLASTY Right     Social History   Socioeconomic History  . Marital status: Married    Spouse name: Not on file  . Number of children: Not on file  . Years of education: Not on file  . Highest education level: Not on file  Occupational History  . Not on file  Social Needs  . Financial resource strain: Not on file  . Food insecurity:    Worry: Not on file    Inability: Not on file  . Transportation needs:    Medical: Not on file    Non-medical:  Not on file  Tobacco Use  . Smoking status: Former Smoker    Packs/day: 0.25    Years: 29.00    Pack years: 7.25    Types: Cigarettes    Last attempt to quit: 12/31/1993    Years since quitting: 25.3  . Smokeless tobacco: Never Used  Substance and Sexual Activity  . Alcohol use: No    Alcohol/week: 0.0 standard drinks  . Drug use: No  . Sexual activity: Not on file  Lifestyle  . Physical activity:    Days per week: Not on file    Minutes per session: Not on file  . Stress: Not on file  Relationships  . Social connections:    Talks on phone: Not on file    Gets together: Not on file    Attends  religious service: Not on file    Active member of club or organization: Not on file    Attends meetings of clubs or organizations: Not on file    Relationship status: Not on file  . Intimate partner violence:    Fear of current or ex partner: Not on file    Emotionally abused: Not on file    Physically abused: Not on file    Forced sexual activity: Not on file  Other Topics Concern  . Not on file  Social History Narrative  . Not on file    Family History  Problem Relation Age of Onset  . Diabetes Sister   . Diabetes Brother   . Diabetes Sister   . Diabetes Brother      Current Outpatient Medications:  .  acetaminophen (TYLENOL) 500 MG tablet, Take 1,000 mg by mouth every 6 (six) hours as needed., Disp: , Rfl:  .  amLODipine (NORVASC) 5 MG tablet, Take 5 mg by mouth daily., Disp: , Rfl:  .  amLODipine (NORVASC) 5 MG tablet, Take by mouth., Disp: , Rfl:  .  aspirin EC 81 MG EC tablet, Take 1 tablet (81 mg total) by mouth daily., Disp: 30 tablet, Rfl: 5 .  Cholecalciferol (VITAMIN D3) 2000 units capsule, Take 2,000 Units by mouth daily., Disp: , Rfl:  .  dorzolamide-timolol (COSOPT) 22.3-6.8 MG/ML ophthalmic solution, Apply 1 drop to eye 2 (two) times daily., Disp: , Rfl:  .  latanoprost (XALATAN) 0.005 % ophthalmic solution, Apply 1 drop to eye at bedtime., Disp: , Rfl:  .  Melatonin 10 MG TABS, Take by mouth., Disp: , Rfl:  .  metFORMIN (GLUCOPHAGE-XR) 500 MG 24 hr tablet, 1,000 mg daily with breakfast. , Disp: , Rfl:  .  metFORMIN (GLUCOPHAGE-XR) 500 MG 24 hr tablet, Take by mouth., Disp: , Rfl:  .  Multiple Vitamins-Minerals (CENTRUM SILVER PO), Take by mouth every morning., Disp: , Rfl:  .  Omega-3 Fatty Acids (FISH OIL) 1200 MG CAPS, Take 2 capsules by mouth daily., Disp: , Rfl:  .  pantoprazole (PROTONIX) 40 MG tablet, Take 1 tablet (40 mg total) by mouth daily., Disp: 30 tablet, Rfl: 0 .  polyethylene glycol (MIRALAX / GLYCOLAX) packet, Take 17 g by mouth daily. (Patient  not taking: Reported on 11/11/2018), Disp: 14 each, Rfl: 0 .  pravastatin (PRAVACHOL) 40 MG tablet, Take 40 mg by mouth daily., Disp: , Rfl:  .  SHINGRIX injection, , Disp: , Rfl:      CMP Latest Ref Rng & Units 02/22/2018  Glucose 65 - 99 mg/dL 284(H)  BUN 6 - 20 mg/dL 15  Creatinine 0.61 - 1.24 mg/dL 0.94  Sodium 135 - 145 mmol/L 139  Potassium 3.5 - 5.1 mmol/L 3.9  Chloride 101 - 111 mmol/L 105  CO2 22 - 32 mmol/L 25  Calcium 8.9 - 10.3 mg/dL 8.8(L)  Total Protein 6.5 - 8.1 g/dL 5.9(L)  Total Bilirubin 0.3 - 1.2 mg/dL 0.8  Alkaline Phos 38 - 126 U/L 64  AST 15 - 41 U/L 20  ALT 17 - 63 U/L 18   CBC Latest Ref Rng & Units 05/12/2019  WBC 4.0 - 10.5 K/uL 7.1  Hemoglobin 13.0 - 17.0 g/dL 15.8  Hematocrit 39.0 - 52.0 % 49.4  Platelets 150 - 400 K/uL 251    Assessment and plan: Patient is a 74 year old with iron deficiency anemia likely secondary to intermittent diverticular bleed.  This is a routine follow-up visit of his iron deficiency anemia  In the past when patient was anemic his hemoglobin did drop down to 10 and he required IV iron at that time.  This has been more than a year.  Currently his hemoglobin is 15.8 and his ferritin and iron studies are normal.  He does not require any IV iron at this time  Follow-up instructions: Repeat CBC ferritin and iron studies in 4 and 8 months and I will see him back in 8 months  I discussed the assessment and treatment plan with the patient. The patient was provided an opportunity to ask questions and all were answered. The patient agreed with the plan and demonstrated an understanding of the instructions.   The patient was advised to call back or seek an in-person evaluation if the symptoms worsen or if the condition fails to improve as anticipated.  I provided 15 minutes of non face-to-face telephone visit time during this encounter, and > 50% was spent counseling as documented under my assessment & plan.  Visit Diagnosis: 1. Iron  deficiency anemia, unspecified iron deficiency anemia type     Dr. Randa Evens, MD, MPH [facility] at [facility] Pager- [phone] 05/13/2019 10:07 AM

## 2019-06-07 ENCOUNTER — Other Ambulatory Visit: Payer: Self-pay

## 2019-06-07 ENCOUNTER — Emergency Department: Payer: Medicare HMO

## 2019-06-07 ENCOUNTER — Emergency Department
Admission: EM | Admit: 2019-06-07 | Discharge: 2019-06-07 | Disposition: A | Discharge status: Home / Self Care | Payer: Medicare HMO | Attending: Emergency Medicine | Admitting: Emergency Medicine

## 2019-06-07 ENCOUNTER — Encounter: Payer: Self-pay | Admitting: *Deleted

## 2019-06-07 DIAGNOSIS — E1121 Type 2 diabetes mellitus with diabetic nephropathy: Secondary | ICD-10-CM | POA: Insufficient documentation

## 2019-06-07 DIAGNOSIS — I110 Hypertensive heart disease with heart failure: Secondary | ICD-10-CM | POA: Diagnosis not present

## 2019-06-07 DIAGNOSIS — Z20828 Contact with and (suspected) exposure to other viral communicable diseases: Secondary | ICD-10-CM | POA: Diagnosis not present

## 2019-06-07 DIAGNOSIS — J449 Chronic obstructive pulmonary disease, unspecified: Secondary | ICD-10-CM | POA: Insufficient documentation

## 2019-06-07 DIAGNOSIS — R0602 Shortness of breath: Secondary | ICD-10-CM | POA: Diagnosis not present

## 2019-06-07 DIAGNOSIS — Z79899 Other long term (current) drug therapy: Secondary | ICD-10-CM | POA: Insufficient documentation

## 2019-06-07 DIAGNOSIS — Z7984 Long term (current) use of oral hypoglycemic drugs: Secondary | ICD-10-CM | POA: Insufficient documentation

## 2019-06-07 DIAGNOSIS — I509 Heart failure, unspecified: Secondary | ICD-10-CM | POA: Diagnosis not present

## 2019-06-07 DIAGNOSIS — I4891 Unspecified atrial fibrillation: Secondary | ICD-10-CM | POA: Diagnosis not present

## 2019-06-07 DIAGNOSIS — Z7982 Long term (current) use of aspirin: Secondary | ICD-10-CM | POA: Insufficient documentation

## 2019-06-07 DIAGNOSIS — Z87891 Personal history of nicotine dependence: Secondary | ICD-10-CM | POA: Insufficient documentation

## 2019-06-07 LAB — BASIC METABOLIC PANEL
Anion gap: 8 (ref 5–15)
BUN: 9 mg/dL (ref 8–23)
CO2: 28 mmol/L (ref 22–32)
Calcium: 10.4 mg/dL — ABNORMAL HIGH (ref 8.9–10.3)
Chloride: 101 mmol/L (ref 98–111)
Creatinine, Ser: 0.72 mg/dL (ref 0.61–1.24)
GFR calc Af Amer: >60 mL/min (ref >60)
GFR calc non Af Amer: >60 mL/min (ref >60)
Glucose, Bld: 141 mg/dL — ABNORMAL HIGH (ref 70–99)
Potassium: 4.3 mmol/L (ref 3.5–5.1)
Sodium: 137 mmol/L (ref 135–145)

## 2019-06-07 LAB — CBC
HCT: 50.5 % (ref 39.0–52.0)
Hemoglobin: 16.4 g/dL (ref 13.0–17.0)
MCH: 27.8 pg (ref 26.0–34.0)
MCHC: 32.5 g/dL (ref 30.0–36.0)
MCV: 85.7 fL (ref 80.0–100.0)
Platelets: 291 10*3/uL (ref 150–400)
RBC: 5.89 MIL/uL — ABNORMAL HIGH (ref 4.22–5.81)
RDW: 13.9 % (ref 11.5–15.5)
WBC: 10.3 10*3/uL (ref 4.0–10.5)
nRBC: 0 % (ref 0.0–0.2)

## 2019-06-07 LAB — TROPONIN I: Troponin I: <0.03 ng/mL (ref <0.03)

## 2019-06-07 NOTE — Discharge Instructions (Signed)
Please follow-up with your primary care provider for symptoms that do not resolve over the next couple of days.  Return to the emergency department for symptoms that change or worsen if you are unable to schedule an appointment.  Your COVID screening should be resulted within the next couple of days.  You will receive a call with the results.

## 2019-06-07 NOTE — ED Provider Notes (Signed)
[facility] Emergency Department Provider Note  ___________________________________________   None    (approximate)  I have reviewed the triage vital signs and the nursing notes.   HISTORY  Chief Complaint Shortness of Breath  HPI Evan [name] is a 74 y.o. male who presents to the emergency department for treatment and evaluation of shortness of breath and lightheadedness upon standing that started this morning.  Patient states that he has been trying to get tested for COVID-19 for the last few days. He denies known exposure, but has been to the grocery store and worries that someone may have given it to him. He denies fever, cough, chest pain, or other symptoms.     Past Medical History:  Diagnosis Date  . Arthritis    xDJD  . Atrial fibrillation ([place])   . BPH (benign prostatic hyperplasia)   . CHF (congestive heart failure) ([place])   . Diabetes mellitus without complication ([place])   . Discitis   . Diverticulitis   . Diverticulosis   . DVT (deep venous thrombosis) ([place])   . Erectile dysfunction   . Hearing loss   . Hyperlipidemia   . Hypertension   . Ischemic optic neuropathy   . Lower GI bleeding   . Non-ischemic cardiomyopathy ([place])   . Sleep apnea     Patient Active Problem List   Diagnosis Date Noted  . Iron deficiency anemia 03/07/2018  . Diverticulosis of large intestine without diverticulitis   . GIB (gastrointestinal bleeding) 02/23/2018  . Near syncope 02/22/2018  . Hematochezia 02/16/2018  . Acute deep vein thrombosis (DVT) of popliteal vein of right lower extremity ([place]) 01/24/2017  . 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 DVT (deep venous thrombosis) ([place]) 01/22/2017  . S/P partial colectomy 01/21/2017  . Acute posthemorrhagic anemia 01/06/2017  . Encounter for blood transfusion 01/06/2017  . Leukocytosis 01/06/2017  . Hypokalemia 01/06/2017  . History of GI diverticular bleed   . BRBPR (bright red blood per  rectum) 12/27/2016  . Stage 2 moderate COPD by GOLD classification ([place]) 05/03/2016  . High risk medication use 05/04/2015  . Sleep apnea 05/04/2015  . 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 Hearing loss 11/07/2014  . Hypercholesteremia 11/07/2014  . Hypertension 11/07/2014  . Lumbar degenerative disc disease 11/07/2014  . Proteinuria due to type 2 diabetes mellitus ([place]) 11/07/2014  . Arthrosis of knee 06/22/2014  . Degenerative joint disease of knee, left 05/06/2014  . Status post total right knee replacement 05/06/2014  . History of adenomatous polyp of colon 01/31/2007    Past Surgical History:  Procedure Laterality Date  . ATRIAL ABLATION SURGERY    . CHOLECYSTECTOMY    . COLONOSCOPY Left 02/24/2018   Procedure: COLONOSCOPY;  Surgeon: Virgel Manifold, MD;  Location: [facility] ENDOSCOPY;  Service: Endoscopy;  Laterality: Left;  . COLONOSCOPY WITH PROPOFOL N/A 09/25/2017   Procedure: COLONOSCOPY WITH PROPOFOL;  Surgeon: Manya Silvas, MD;  Location: [facility] ENDOSCOPY;  Service: Endoscopy;  Laterality: N/A;  . COLONOSCOPY WITH PROPOFOL N/A 02/18/2018   Procedure: COLONOSCOPY WITH PROPOFOL;  Surgeon: [name], MD;  Location: [facility] ENDOSCOPY;  Service: Endoscopy;  Laterality: N/A;  . ESOPHAGOGASTRODUODENOSCOPY Left 02/23/2018   Procedure: ESOPHAGOGASTRODUODENOSCOPY (EGD);  Surgeon: Virgel Manifold, MD;  Location: [facility] ENDOSCOPY;  Service: Endoscopy;  Laterality: Left;  . JOINT REPLACEMENT Right    knee  . LAPAROTOMY  01/01/2017   Procedure: EXPLORATORY LAPAROTOMY;  Surgeon: Olean Ree, MD;  Location: [facility] ORS;  Service: General;;  . PARTIAL COLECTOMY N/A 01/01/2017   Procedure: PARTIAL COLECTOMY;  Surgeon: Olean Ree, MD;  Location: [facility] ORS;  Service: General;  Laterality: N/A;  . PERIPHERAL VASCULAR CATHETERIZATION N/A 12/28/2016   Procedure: Visceral Artery Intervention;  Surgeon: Algernon Huxley, MD;  Location: [place] CV LAB;  Service: Cardiovascular;  Laterality: N/A;  . SECONDARY CLOSURE OF WOUND N/A 01/08/2017   Procedure: SECONDARY CLOSURE FOR EVISCERATION;  Surgeon: Florene Glen, MD;  Location: [facility] ORS;  Service: General;  Laterality: N/A;  . SIGMOIDECTOMY    . TOTAL KNEE ARTHROPLASTY Right     Prior to Admission medications   Medication Sig Start Date End Date Taking? Authorizing Provider  acetaminophen (TYLENOL) 500 MG tablet Take 1,000 mg by mouth every 6 (six) hours as needed.    [provider]  amLODipine (NORVASC) 5 MG tablet Take 5 mg by mouth daily. 05/03/16   [provider]  aspirin EC 81 MG EC tablet Take 1 tablet (81 mg total) by mouth daily. 01/16/17   Hubbard Robinson, MD  Cholecalciferol (VITAMIN D3) 2000 units capsule Take 2,000 Units by mouth daily.    [provider]  dorzolamide-timolol (COSOPT) 22.3-6.8 MG/ML ophthalmic solution Place 1 drop into both eyes 2 (two) times daily.     [provider]  latanoprost (XALATAN) 0.005 % ophthalmic solution Place 1 drop into both eyes at bedtime.     [provider]  metFORMIN (GLUCOPHAGE-XR) 500 MG 24 hr tablet 1,000 mg daily with breakfast.  08/30/17   [provider]  Multiple Vitamins-Minerals (CENTRUM SILVER PO) Take by mouth every morning.    [provider]  pantoprazole (PROTONIX) 40 MG tablet Take 1 tablet (40 mg total) by mouth daily. 02/25/18   Gouru, Illene Silver, MD  polyethylene glycol (MIRALAX / GLYCOLAX) packet Take 17 g by mouth daily. 02/25/18   Nicholes Mango, MD  pravastatin (PRAVACHOL) 40 MG tablet Take 40 mg by mouth daily. 05/03/16   [provider]    Allergies Azithromycin, Penicillins, Lisinopril, Losartan, and Metoprolol  Family History  Problem Relation Age of Onset  . Diabetes Sister   . Diabetes Brother   . Diabetes Sister   . Diabetes Brother     Social History Social History   Tobacco Use  . Smoking status: Former Smoker     Packs/day: 0.25    Years: 29.00    Pack years: 7.25    Types: Cigarettes    Quit date: 12/31/1993    Years since quitting: 25.4  . Smokeless tobacco: Never Used  Substance Use Topics  . Alcohol use: No    Alcohol/week: 0.0 standard drinks  . Drug use: No    Review of Systems  Constitutional: No fever/chills Eyes: No visual changes. ENT: No sore throat. Cardiovascular: Denies chest pain. Respiratory: Denies shortness of breath. Gastrointestinal: No abdominal pain.  No nausea, no vomiting.  No diarrhea.  No constipation. Genitourinary: Negative for dysuria. Musculoskeletal: Negative for back pain. Skin: Negative for rash. Neurological: Negative for headaches, focal weakness or numbness. ___________________________________________   PHYSICAL EXAM:  VITAL SIGNS: ED Triage Vitals  Enc Vitals Group     BP 06/07/19 1313 (!) 166/84     Pulse Rate 06/07/19 1313 84     Resp 06/07/19 1313 18     Temp 06/07/19 1313 98 F (36.7 C)     Temp Source 06/07/19 1313 Oral     SpO2 06/07/19 1313 100 %  Weight 06/07/19 1323 290 lb 5.5 oz (131.7 kg)     Height --      Head Circumference --      Peak Flow --      Pain Score 06/07/19 1323 0     Pain Loc --      Pain Edu? --      Excl. in [place]? --     Constitutional: Alert and oriented. Well appearing and in no acute distress. Eyes: Conjunctivae are normal. PERRL. EOMI. Head: Atraumatic. Nose: No congestion/rhinnorhea. Mouth/Throat: Mucous membranes are moist.  Oropharynx non-erythematous. Neck: No stridor.   Cardiovascular: Normal rate, regular rhythm. Grossly normal heart sounds.  Good peripheral circulation. Respiratory: Normal respiratory effort.  No retractions. Lungs CTAB. Gastrointestinal: Soft and nontender. No distention. No abdominal bruits. No CVA tenderness. Musculoskeletal: No lower extremity tenderness nor edema.  No joint effusions. Neurologic:  Normal speech and language. No gross focal neurologic deficits are  appreciated. No gait instability. Skin:  Skin is warm, dry and intact. No rash noted. Psychiatric: Mood and affect are normal. Speech and behavior are normal.  ____________________________________________   LABS (all labs ordered are listed, but only abnormal results are displayed)  Labs Reviewed  CBC - Abnormal; Notable for the following components:      Result Value   RBC 5.89 (*)    All other components within normal limits  BASIC METABOLIC PANEL - Abnormal; Notable for the following components:   Glucose, Bld 141 (*)    Calcium 10.4 (*)    All other components within normal limits  NOVEL CORONAVIRUS, NAA (HOSPITAL ORDER, SEND-OUT TO REF LAB)  TROPONIN I   ____________________________________________  EKG  ED ECG REPORT I, Elisabetta Mishra, FNP-BC personally viewed and interpreted this ECG.   Date: 06/07/2019  EKG Time: 1322  Rate: 84  Rhythm: normal sinus rhythm with occasional PVC  Axis: normal axis  Intervals:first-degree A-V block   ST&T Change: No ST elevation.  ____________________________________________  RADIOLOGY  ED MD interpretation: No focal airspace consolidation or effusion on the chest x-ray there is some subtle prominence of the infrahilar markings due to degree of hypoinflation.  Cardiomediastinal silhouette and the remainder of the images unchanged.  Official radiology report(s): No results found.  ____________________________________________   PROCEDURES  Procedure(s) performed: None  Procedures  Critical Care performed: No  ____________________________________________   INITIAL IMPRESSION / ASSESSMENT AND PLAN / ED COURSE    74 year old male presenting to the emergency department for COVID testing.  He states that he has had more shortness of breath than at his baseline.  He does see Dr. Raul Del for management of COPD.  He is concerned that he may have come into contact with the virus while in the grocery store.  Overall, his exam is  very reassuring.  Labs will be obtained as well as chest x-ray.  A send out COVID testing will be collected.  Patient seems satisfied with these plans.  Labs, chest x-ray, and EKG are all within the normal range for this patient.  He is to follow-up with his primary care provider for shortness of breath that increases.  He is to return to the emergency department for any symptom that changes or worsens if he is unable to schedule an appointment with primary care.       ____________________________________________   FINAL CLINICAL IMPRESSION(S) / ED DIAGNOSES  Final diagnoses:  SOB (shortness of breath)     ED Discharge Orders    None  Note:  This document was prepared using Dragon voice recognition software and may include unintentional dictation errors.    Victorino Dike, FNP 06/12/19 1742    Earleen Newport, MD 06/21/19 1500

## 2019-06-07 NOTE — ED Triage Notes (Signed)
PT to Ed reporting SOB and lightheadedness that started this morning. The lightheartedness is noted upon standing but the SOB pt reports feels as though it is hard to take a breath. NO increased WOB noted in triage. NO fevers and no chest pain.

## 2019-06-08 LAB — NOVEL CORONAVIRUS, NAA (HOSP ORDER, SEND-OUT TO REF LAB; TAT 18-24 HRS): SARS-CoV-2, NAA: NOT DETECTED

## 2019-06-09 ENCOUNTER — Telehealth: Payer: Self-pay | Admitting: Emergency Medicine

## 2019-06-09 NOTE — Telephone Encounter (Signed)
pateint answered and I told him covid 19 result negative. Says he is feeling better.

## 2019-06-09 NOTE — Telephone Encounter (Signed)
Called patient to inform of covid 19 result.  Left message

## 2019-08-03 ENCOUNTER — Other Ambulatory Visit: Payer: Self-pay

## 2019-08-03 ENCOUNTER — Encounter: Payer: Self-pay | Admitting: Gastroenterology

## 2019-08-03 ENCOUNTER — Ambulatory Visit (INDEPENDENT_AMBULATORY_CARE_PROVIDER_SITE_OTHER): Payer: Medicare HMO | Admitting: Gastroenterology

## 2019-08-03 VITALS — BP 127/69 | HR 73 | Temp 98.9°F | Ht 75.0 in | Wt 300.0 lb

## 2019-08-03 DIAGNOSIS — R194 Change in bowel habit: Secondary | ICD-10-CM

## 2019-08-03 NOTE — Progress Notes (Signed)
[name], MD [address]  [place]  [place], [place] [address]  Main: [phone]  Fax: [phone]   Primary Care Physician: Ezequiel Kayser, MD   Chief complaint: Bowel movements after eating, gas  HPI: Evan [name] is a 74 y.o. male previously seen in June 2019, with a history of multiple diverticular bleeding, partial colectomy in 2019 due to diverticular bleeding, embolization due to diverticular bleeding in the past as well, now presenting for follow-up.  Patient is taking MiraLAX once daily.  With this he reports 1-2 soft bowel movements daily.  However his complaint is that after eating, about 40 minutes later he has to have a bowel movement.  Sometimes this is foul-smelling and he has a lot of gas, but sometimes the stool quantity itself is small, but it is soft.  No black stool or red stool.  No nausea or vomiting.  No weight loss.  No abdominal pain.  His ferritin level and hemoglobin is normal on last check by hematology.  Last evaluated by surgery, Dr. Hampton Abbot in March 2019, and as per his note:  "Discussed with the patient that at this point, with a negative nuclear scan, and two colonoscopies not revealing active bleeding or a clear source, there is no surgical intervention needed. Did discuss with the patient that without a clear target, he would require a subtotal colectomy with ileorectal anastomosis, which would be quite a significant surgical procedure to undertake and would be lifestyle changing as he would likely have 5+ bowel movements per day.  Discussed with patient that he should continue taking his MiraLax and avoid constipation or straining. This may have contributed to his bleeding episode. "  Previous history: Last colonoscopy was Feb 25th 2019 Findings: The perianal and digital rectal examinations were normal. The terminal Ileum was seen but could not be intubated due to looping of  the scope despite abdominal  pressure. The brief view of the terminal  ileum did not show any blood in the terminal ileum. Multiple small and large-mouthed diverticula were found in the sigmoid  colon and ascending colon. These were not bleeding. The retroflexed view of the distal rectum and anal verge was normal and  showed no anal or rectal abnormalities. No evidence of old or new blood was seen throughout the exam. The stool  was yellow to green in color with no black stool or red blood present. Impression: - Diverticulosis in the sigmoid colon and in the  ascending colon. - The distal rectum and anal verge are normal on  retroflexion view. - No evidence of old or new blood was seen throughout  the exam. The stool was yellow to green in color with  no black stool or red blood present. - No specimens collected. - Clinical history and findings are consistent with  hematochezia on presentation being from diverticular  bleeding. Pt. has had no further hematochezia yesterday  with the prep and no blood is seen in the exam today,  and Hgb has been stable around 8-9 since yesterday  morning, consistent with resolved diverticular bleed.   Last EGD Feb 23 2018: Findings: The examined esophagus was normal. There is no endoscopic evidence of bleeding in the entire esophagus. The entire examined stomach was normal. There is no endoscopic evidence of bleeding or ulceration in the entire  examined stomach. The examined duodenum was normal. There is no endoscopic evidence of bleeding in the entire examined  duodenum. Impression: - Normal  esophagus. - Normal stomach. -  Normal examined duodenum.    He has had previous history of partial colectomy in January 2018.Colonoscopy on February 18, 2018 by Dr. Allen Norris showed previous anastomosis site. Diverticulosis was noted. No actively late bleeding lesions were reported, however colonoscopy report states more blood was seen in the left than the right colon. Bleeding scan was recommended if patient continued to have further bleeding.   Patient had diverticular bleed about 3-4 years ago, and required embolization at that time.He presented in December 2017 with amount of and underwent angiogram with embolization of the sigmoid colon, but had recurrence of bleeding during that admission. He underwent partial colectomy and exploratory laparotomy on January 01, 2017. Sigmoid colon distal descending colon was removed. He returned on January 08, 2018 due to evisceration from exploratory laparotomy site, and was taken for emergency surgery for wound evisceration.  He underwent colonoscopy by Dr. Vira Agar in September 2018, 1 rectal polyp was removed, and an isolated ulcer was seen in the descending colon. Biopsies showed polypoid colonic mucosa with fibrosis, inflammation of the lamina propria.  Current Outpatient Medications  Medication Sig Dispense Refill  . acetaminophen (TYLENOL) 500 MG tablet Take 1,000 mg by mouth every 6 (six) hours as needed.    [name] amLODipine (NORVASC) 5 MG tablet Take 5 mg by mouth daily.    [name] aspirin EC 81 MG EC tablet Take 1 tablet (81 mg total) by mouth daily. 30 tablet 5  . Cholecalciferol (VITAMIN D3) 2000 units capsule Take 2,000 Units by mouth daily.    . dorzolamide-timolol (COSOPT) 22.3-6.8 MG/ML ophthalmic solution Place 1 drop into both eyes 2 (two) times daily.     [name] latanoprost (XALATAN) 0.005 % ophthalmic solution Place 1 drop into both eyes at bedtime.     . metFORMIN (GLUCOPHAGE-XR) 500 MG 24 hr  tablet 1,000 mg daily with breakfast.     . Multiple Vitamins-Minerals (CENTRUM SILVER PO) Take by mouth every morning.    . polyethylene glycol (MIRALAX / GLYCOLAX) packet Take 17 g by mouth daily. 14 each 0  . pravastatin (PRAVACHOL) 40 MG tablet Take 40 mg by mouth daily.    . vitamin B-12 (CYANOCOBALAMIN) 1000 MCG tablet Take 1,000 mcg by mouth daily.    . pantoprazole (PROTONIX) 40 MG tablet Take 1 tablet (40 mg total) by mouth daily. (Patient not taking: Reported on 08/03/2019) 30 tablet 0   No current facility-administered medications for this visit.     Allergies as of 08/03/2019 - Review Complete 08/03/2019  Allergen Reaction Noted  . Azithromycin Rash 07/09/2016  . Penicillins Rash 07/09/2016  . Lisinopril Cough 07/09/2016  . Losartan Cough 07/09/2016  . Metoprolol Other (See Comments) 07/09/2016    ROS:  General: Negative for anorexia, weight loss, fever, chills, fatigue, weakness. ENT: Negative for hoarseness, difficulty swallowing , nasal congestion. CV: Negative for chest pain, angina, palpitations, dyspnea on exertion, peripheral edema.  Respiratory: Negative for dyspnea at rest, dyspnea on exertion, cough, sputum, wheezing.  GI: See history of present illness. GU:  Negative for dysuria, hematuria, urinary incontinence, urinary frequency, nocturnal urination.  Endo: Negative for unusual weight change.    Physical Examination:   BP 127/69   Pulse 73   Temp 98.9 F (37.2 C) (Oral)   Ht 6\' 3"  (1.905 m)   Wt 300 lb (136.1 kg)   BMI 37.50 kg/m   General: Well-nourished, well-developed in no acute distress.  Eyes: No icterus. Conjunctivae pink. Mouth: Oropharyngeal mucosa moist and pink , no lesions erythema or exudate.  Neck: Supple, Trachea midline Abdomen: Bowel sounds are normal, nontender, nondistended, no hepatosplenomegaly or masses, no abdominal bruits or hernia , no rebound or guarding.   Extremities: No lower extremity edema. No clubbing or deformities.  Neuro: Alert and oriented x 3.  Grossly intact. Skin: Warm and dry, no jaundice.   Psych: Alert and cooperative, normal mood and affect.   Labs: CMP     Component Value Date/Time   NA 137 06/07/2019 1326   NA 136 03/26/2014 0418   K 4.3 06/07/2019 1326   K 4.0 03/26/2014 0418   CL 101 06/07/2019 1326   CL 102 03/26/2014 0418   CO2 28 06/07/2019 1326   CO2 34 (H) 03/26/2014 0418   GLUCOSE 141 (H) 06/07/2019 1326   GLUCOSE 114 (H) 03/26/2014 0418   BUN 9 06/07/2019 1326   BUN 8 03/26/2014 0418   CREATININE 0.72 06/07/2019 1326   CREATININE 0.90 03/26/2014 0418   CALCIUM 10.4 (H) 06/07/2019 1326   CALCIUM 9.2 03/26/2014 0418   PROT 5.9 (L) 02/22/2018 0510   PROT 6.3 (L) 10/04/2013 0533   ALBUMIN 3.4 (L) 02/22/2018 0510   ALBUMIN 3.0 (L) 10/04/2013 0533   AST 20 02/22/2018 0510   AST 8 (L) 10/04/2013 0533   ALT 18 02/22/2018 0510   ALT 17 10/04/2013 0533   ALKPHOS 64 02/22/2018 0510   ALKPHOS 92 10/04/2013 0533   BILITOT 0.8 02/22/2018 0510   BILITOT 1.7 (H) 10/04/2013 0533   GFRNONAA >60 06/07/2019 1326   GFRNONAA >60 03/26/2014 0418   GFRAA >60 06/07/2019 1326   GFRAA >60 03/26/2014 0418   Lab Results  Component Value Date   WBC 10.3 06/07/2019   HGB 16.4 06/07/2019   HCT 50.5 06/07/2019   MCV 85.7 06/07/2019   PLT 291 06/07/2019    Imaging Studies: No results found.  Assessment and Plan:   Evan [name] is a 74 y.o. y/o male with previous history of recurrent diverticular bleeding, requiring embolization and partial colectomy in the past, last episode of bleeding occurring in February 2019 and asymptomatic from this standpoint since then, with normal hemoglobin and improved iron levels after IV iron replacement with hematology  The Merril [facility]be is working well for the patient as he is having soft bowel movements without straining or any evidence of bleeding.  However, he is concerned that he has to go within 45 minutes of eating, about twice a day and sometimes  he is not at home and he has to find a bathroom outside of his house.  Sometimes these are small in quantity.  Therefore, we will increase the MiraLAX to 2 doses in the morning instead of 1 dose in the morning to allow him to empty out better in the morning, before he heads out for the day.  I have asked him to call us back in 2 weeks to let us know how this is working for him.  Continue follow-up with hematology  Follow-up with [place] in 1 year  No alarm symptoms present, no indication for repeat colonoscopy at this time.  [facility] notes report history of adenomatous polyps in 2010.  We do not have this procedure report from 2010.  Subsequent colonoscopy since then did not reveal any polyps.  Can consider future colonoscopies for polyp surveillance after discussing risks and benefits during future visits if patient is interested.  Dr [name]

## 2019-08-15 ENCOUNTER — Other Ambulatory Visit: Payer: Self-pay

## 2019-08-15 DIAGNOSIS — Z20822 Contact with and (suspected) exposure to covid-19: Secondary | ICD-10-CM

## 2019-08-16 LAB — NOVEL CORONAVIRUS, NAA: SARS-CoV-2, NAA: NOT DETECTED

## 2019-09-14 ENCOUNTER — Inpatient Hospital Stay: Payer: Medicare HMO | Attending: Oncology

## 2019-09-14 ENCOUNTER — Other Ambulatory Visit: Payer: Self-pay

## 2019-09-14 DIAGNOSIS — D509 Iron deficiency anemia, unspecified: Secondary | ICD-10-CM

## 2019-09-14 DIAGNOSIS — D500 Iron deficiency anemia secondary to blood loss (chronic): Secondary | ICD-10-CM | POA: Diagnosis present

## 2019-09-14 LAB — CBC WITH DIFFERENTIAL/PLATELET
Abs Immature Granulocytes: 0.07 10*3/uL (ref 0.00–0.07)
Basophils Absolute: 0 10*3/uL (ref 0.0–0.1)
Basophils Relative: 1 %
Eosinophils Absolute: 0.2 10*3/uL (ref 0.0–0.5)
Eosinophils Relative: 2 %
HCT: 48.1 % (ref 39.0–52.0)
Hemoglobin: 15.4 g/dL (ref 13.0–17.0)
Immature Granulocytes: 1 %
Lymphocytes Relative: 30 %
Lymphs Abs: 2.3 10*3/uL (ref 0.7–4.0)
MCH: 27.4 pg (ref 26.0–34.0)
MCHC: 32 g/dL (ref 30.0–36.0)
MCV: 85.4 fL (ref 80.0–100.0)
Monocytes Absolute: 0.7 10*3/uL (ref 0.1–1.0)
Monocytes Relative: 10 %
Neutro Abs: 4.4 10*3/uL (ref 1.7–7.7)
Neutrophils Relative %: 56 %
Platelets: 283 10*3/uL (ref 150–400)
RBC: 5.63 MIL/uL (ref 4.22–5.81)
RDW: 13.7 % (ref 11.5–15.5)
WBC: 7.7 10*3/uL (ref 4.0–10.5)
nRBC: 0 % (ref 0.0–0.2)

## 2019-09-14 LAB — IRON AND TIBC
Iron: 98 ug/dL (ref 45–182)
Saturation Ratios: 25 % (ref 17.9–39.5)
TIBC: 393 ug/dL (ref 250–450)
UIBC: 295 ug/dL

## 2019-09-14 LAB — FERRITIN: Ferritin: 49 ng/mL (ref 24–336)

## 2019-11-03 ENCOUNTER — Ambulatory Visit: Payer: Medicare HMO | Admitting: Gastroenterology

## 2020-01-12 ENCOUNTER — Encounter: Payer: Self-pay | Admitting: Oncology

## 2020-01-12 ENCOUNTER — Other Ambulatory Visit: Payer: Self-pay

## 2020-01-12 ENCOUNTER — Inpatient Hospital Stay: Payer: Medicare HMO | Attending: Oncology

## 2020-01-12 ENCOUNTER — Inpatient Hospital Stay (HOSPITAL_BASED_OUTPATIENT_CLINIC_OR_DEPARTMENT_OTHER): Payer: Medicare HMO | Admitting: Oncology

## 2020-01-12 VITALS — BP 162/109 | HR 79 | Temp 96.7°F | Ht 75.0 in | Wt 299.0 lb

## 2020-01-12 DIAGNOSIS — M1990 Unspecified osteoarthritis, unspecified site: Secondary | ICD-10-CM | POA: Diagnosis not present

## 2020-01-12 DIAGNOSIS — G4730 Sleep apnea, unspecified: Secondary | ICD-10-CM | POA: Diagnosis not present

## 2020-01-12 DIAGNOSIS — E785 Hyperlipidemia, unspecified: Secondary | ICD-10-CM | POA: Insufficient documentation

## 2020-01-12 DIAGNOSIS — I110 Hypertensive heart disease with heart failure: Secondary | ICD-10-CM | POA: Diagnosis not present

## 2020-01-12 DIAGNOSIS — I4891 Unspecified atrial fibrillation: Secondary | ICD-10-CM | POA: Diagnosis not present

## 2020-01-12 DIAGNOSIS — Z79899 Other long term (current) drug therapy: Secondary | ICD-10-CM | POA: Insufficient documentation

## 2020-01-12 DIAGNOSIS — Z87891 Personal history of nicotine dependence: Secondary | ICD-10-CM | POA: Insufficient documentation

## 2020-01-12 DIAGNOSIS — Z7982 Long term (current) use of aspirin: Secondary | ICD-10-CM | POA: Diagnosis not present

## 2020-01-12 DIAGNOSIS — N400 Enlarged prostate without lower urinary tract symptoms: Secondary | ICD-10-CM | POA: Diagnosis not present

## 2020-01-12 DIAGNOSIS — D509 Iron deficiency anemia, unspecified: Secondary | ICD-10-CM | POA: Diagnosis not present

## 2020-01-12 DIAGNOSIS — I509 Heart failure, unspecified: Secondary | ICD-10-CM | POA: Diagnosis not present

## 2020-01-12 DIAGNOSIS — E119 Type 2 diabetes mellitus without complications: Secondary | ICD-10-CM | POA: Insufficient documentation

## 2020-01-12 DIAGNOSIS — Z7984 Long term (current) use of oral hypoglycemic drugs: Secondary | ICD-10-CM | POA: Insufficient documentation

## 2020-01-12 LAB — CBC WITH DIFFERENTIAL/PLATELET
Abs Immature Granulocytes: 0.04 10*3/uL (ref 0.00–0.07)
Basophils Absolute: 0.1 10*3/uL (ref 0.0–0.1)
Basophils Relative: 1 %
Eosinophils Absolute: 0.2 10*3/uL (ref 0.0–0.5)
Eosinophils Relative: 2 %
HCT: 52.1 % — ABNORMAL HIGH (ref 39.0–52.0)
Hemoglobin: 15.9 g/dL (ref 13.0–17.0)
Immature Granulocytes: 1 %
Lymphocytes Relative: 31 %
Lymphs Abs: 2.4 10*3/uL (ref 0.7–4.0)
MCH: 26.5 pg (ref 26.0–34.0)
MCHC: 30.5 g/dL (ref 30.0–36.0)
MCV: 87 fL (ref 80.0–100.0)
Monocytes Absolute: 0.8 10*3/uL (ref 0.1–1.0)
Monocytes Relative: 10 %
Neutro Abs: 4.5 10*3/uL (ref 1.7–7.7)
Neutrophils Relative %: 55 %
Platelets: 293 10*3/uL (ref 150–400)
RBC: 5.99 MIL/uL — ABNORMAL HIGH (ref 4.22–5.81)
RDW: 14.2 % (ref 11.5–15.5)
WBC: 7.9 10*3/uL (ref 4.0–10.5)
nRBC: 0 % (ref 0.0–0.2)

## 2020-01-12 LAB — IRON AND TIBC
Iron: 89 ug/dL (ref 45–182)
Saturation Ratios: 21 % (ref 17.9–39.5)
TIBC: 423 ug/dL (ref 250–450)
UIBC: 334 ug/dL

## 2020-01-12 LAB — FERRITIN: Ferritin: 46 ng/mL (ref 24–336)

## 2020-01-12 NOTE — Progress Notes (Signed)
Patient stated that he had been doing well with no complaints. 

## 2020-01-14 NOTE — Progress Notes (Signed)
Hematology/Oncology Consult note [facility]  [phone] Fax:([phone]  Patient Care Team: Ezequiel Kayser, MD as PCP - General (Internal Medicine)   Name of the patient: Evan [name]  [id number]  05-Aug-1945   Date of visit: 01/14/20  Diagnosis-iron deficiency anemia  Chief complaint/ Reason for visit-routine follow-up of iron deficiency anemia  Heme/Onc history: patient is a 75 year old African-American male with a prior history of diverticulosis and diverticular bleed status post partial colectomy in January 2018. He presented with symptoms of hematochezia to the hospital in February 2019 and underwent colonoscopy by Dr. Allen Norris which showed evidence of diverticulosis but no active diverticular bleed. There was some blood noted in the left colon. This was followed by another colonoscopy on 02/24/2018 which again did not show any evidence of active bleeding. Diverticulosis noted. He also had an upper endoscopy which was unremarkable. The cause of his GI bleed was attributed to intermittent self-limited diverticular bleeding. Patient has not had a capsule endoscopy yet. He has been referred to [place] for iron deficiency anemia.  Patient denies any consistent use of NSAIDs. He has been on oral iron twice a day for the last 2 years. He denies any fatigue or unintentional weight loss. Denies any blood in his urine. Patient's hemoglobin was around 7-8 in January 2018 when he had the initial episode of diverticular bleed. In February 2019 his hemoglobin was 12.4 and had been gradually trending down to 9. On 02/23/2018 he suddenly dropped his hemoglobin to 5.6 and required blood transfusion at that time. Most recent CBC from 02/25/2018 showed white count of 10.7, H&H of 9.4/27.6 and a platelet count of 272. B12 levels last checked in May 2018 was 378. Iron studies on 02/27/2018 were as follows: Ferritin was low at 22, serum iron low at 25. TIBC was high  at 450. Patient denies any hematochezia over the last 5-6 days   Interval history-he is doing well overall and denies any complaints at this time.  Denies any fatigue or bleeding in his stool or urine.  Denies any dark tarry stools  ECOG PS- 1 Pain scale- 0   Review of systems- Review of Systems  Constitutional: Negative for chills, fever, malaise/fatigue and weight loss.  HENT: Negative for congestion, ear discharge and nosebleeds.   Eyes: Negative for blurred vision.  Respiratory: Negative for cough, hemoptysis, sputum production, shortness of breath and wheezing.   Cardiovascular: Negative for chest pain, palpitations, orthopnea and claudication.  Gastrointestinal: Negative for abdominal pain, blood in stool, constipation, diarrhea, heartburn, melena, nausea and vomiting.  Genitourinary: Negative for dysuria, flank pain, frequency, hematuria and urgency.  Musculoskeletal: Negative for back pain, joint pain and myalgias.  Skin: Negative for rash.  Neurological: Negative for dizziness, tingling, focal weakness, seizures, weakness and headaches.  Endo/Heme/Allergies: Does not bruise/bleed easily.  Psychiatric/Behavioral: Negative for depression and suicidal ideas. The patient does not have insomnia.      Allergies  Allergen Reactions  . Azithromycin Rash  . Penicillins Rash    Has patient had a PCN reaction causing immediate rash, facial/tongue/throat swelling, SOB or lightheadedness with hypotension: Yes Has patient had a PCN reaction causing severe rash involving mucus membranes or skin necrosis: Yes Has patient had a PCN reaction that required hospitalization No Has patient had a PCN reaction occurring within the last 10 years: No If all of the above answers are "NO", then may proceed with Cephalosporin use. Has patient had a PCN reaction causing immediate rash, facial/tongue/throat swelling, SOB  or lightheadedness with hypotension: Yes Has patient had a PCN reaction causing  severe rash involving mucus membranes or skin necrosis: Yes Has patient had a PCN reaction that required hospitalization No Has patient had a PCN reaction occurring within the last 10 years: No If all of the above answers are "NO", then may proceed with Cephalosporin use.   [name] Lisinopril Cough  . Losartan Cough  . Metoprolol Other (See Comments)    Bradycardia.     Past Medical History:  Diagnosis Date  . Arthritis    xDJD  . Atrial fibrillation ([place])   . BPH (benign prostatic hyperplasia)   . CHF (congestive heart failure) ([place])   . Diabetes mellitus without complication ([place])   . Discitis   . Diverticulitis   . Diverticulosis   . DVT (deep venous thrombosis) ([place])   . Erectile dysfunction   . Hearing loss   . Hyperlipidemia   . Hypertension   . Ischemic optic neuropathy   . Lower GI bleeding   . Non-ischemic cardiomyopathy ([place])   . Sleep apnea      Past Surgical History:  Procedure Laterality Date  . ATRIAL ABLATION SURGERY    . CHOLECYSTECTOMY    . COLONOSCOPY Left 02/24/2018   Procedure: COLONOSCOPY;  Surgeon: Virgel Manifold, MD;  Location: [facility] ENDOSCOPY;  Service: Endoscopy;  Laterality: Left;  . COLONOSCOPY WITH PROPOFOL N/A 09/25/2017   Procedure: COLONOSCOPY WITH PROPOFOL;  Surgeon: Manya Silvas, MD;  Location: [facility] ENDOSCOPY;  Service: Endoscopy;  Laterality: N/A;  . COLONOSCOPY WITH PROPOFOL N/A 02/18/2018   Procedure: COLONOSCOPY WITH PROPOFOL;  Surgeon: [name], MD;  Location: [facility] ENDOSCOPY;  Service: Endoscopy;  Laterality: N/A;  . ESOPHAGOGASTRODUODENOSCOPY Left 02/23/2018   Procedure: ESOPHAGOGASTRODUODENOSCOPY (EGD);  Surgeon: Virgel Manifold, MD;  Location: [facility] ENDOSCOPY;  Service: Endoscopy;  Laterality: Left;  . JOINT REPLACEMENT Right    knee  . LAPAROTOMY  01/01/2017   Procedure: EXPLORATORY LAPAROTOMY;  Surgeon: Olean Ree, MD;  Location: [facility] ORS;  Service: General;;  . PARTIAL COLECTOMY N/A 01/01/2017   Procedure: PARTIAL  COLECTOMY;  Surgeon: Olean Ree, MD;  Location: [facility] ORS;  Service: General;  Laterality: N/A;  . PERIPHERAL VASCULAR CATHETERIZATION N/A 12/28/2016   Procedure: Visceral Artery Intervention;  Surgeon: Algernon Huxley, MD;  Location: [facility] LAB;  Service: Cardiovascular;  Laterality: N/A;  . SECONDARY CLOSURE OF WOUND N/A 01/08/2017   Procedure: SECONDARY CLOSURE FOR EVISCERATION;  Surgeon: Florene Glen, MD;  Location: [facility] ORS;  Service: General;  Laterality: N/A;  . SIGMOIDECTOMY    . TOTAL KNEE ARTHROPLASTY Right     Social History   Socioeconomic History  . Marital status: Married    Spouse name: Not on file  . Number of children: Not on file  . Years of education: Not on file  . Highest education level: Not on file  Occupational History  . Not on file  Tobacco Use  . Smoking status: Former Smoker    Packs/day: 0.25    Years: 29.00    Pack years: 7.25    Types: Cigarettes    Quit date: 12/31/1993    Years since quitting: 26.0  . Smokeless tobacco: Never Used  Substance and Sexual Activity  . Alcohol use: No    Alcohol/week: 0.0 standard drinks  . Drug use: No  . Sexual activity: Not on file  Other Topics Concern  . Not on file  Social History Narrative  . Not on file  Social Determinants of Health   Financial Resource Strain:   . Difficulty of Paying Living Expenses: Not on file  Food Insecurity:   . Worried About Charity fundraiser in the Last Year: Not on file  . Ran Out of Food in the Last Year: Not on file  Transportation Needs:   . Lack of Transportation (Medical): Not on file  . Lack of Transportation (Non-Medical): Not on file  Physical Activity:   . Days of Exercise per Week: Not on file  . Minutes of Exercise per Session: Not on file  Stress:   . Feeling of Stress : Not on file  Social Connections:   . Frequency of Communication with Friends and Family: Not on file  . Frequency of Social Gatherings with Friends and Family: Not on file  .  Attends Religious Services: Not on file  . Active Member of Clubs or Organizations: Not on file  . Attends Archivist Meetings: Not on file  . Marital Status: Not on file  Intimate Partner Violence:   . Fear of Current or Ex-Partner: Not on file  . Emotionally Abused: Not on file  . Physically Abused: Not on file  . Sexually Abused: Not on file    Family History  Problem Relation Age of Onset  . Diabetes Sister   . Diabetes Brother   . Diabetes Sister   . Diabetes Brother      Current Outpatient Medications:  .  acetaminophen (TYLENOL) 500 MG tablet, Take 1,000 mg by mouth every 6 (six) hours as needed., Disp: , Rfl:  .  amLODipine (NORVASC) 5 MG tablet, Take 5 mg by mouth daily., Disp: , Rfl:  .  aspirin EC 81 MG EC tablet, Take 1 tablet (81 mg total) by mouth daily., Disp: 30 tablet, Rfl: 5 .  Cholecalciferol (VITAMIN D3) 2000 units capsule, Take 2,000 Units by mouth daily., Disp: , Rfl:  .  dorzolamide-timolol (COSOPT) 22.3-6.8 MG/ML ophthalmic solution, Place 1 drop into both eyes 2 (two) times daily. , Disp: , Rfl:  .  doxycycline (VIBRAMYCIN) 100 MG capsule, Take 100 mg by mouth 2 (two) times daily., Disp: , Rfl:  .  latanoprost (XALATAN) 0.005 % ophthalmic solution, Place 1 drop into both eyes at bedtime. , Disp: , Rfl:  .  metFORMIN (GLUCOPHAGE-XR) 500 MG 24 hr tablet, 1,000 mg daily with breakfast. , Disp: , Rfl:  .  Multiple Vitamins-Minerals (CENTRUM SILVER PO), Take by mouth every morning., Disp: , Rfl:  .  polyethylene glycol (MIRALAX / GLYCOLAX) packet, Take 17 g by mouth daily., Disp: 14 each, Rfl: 0 .  pravastatin (PRAVACHOL) 40 MG tablet, Take 40 mg by mouth daily., Disp: , Rfl:  .  vitamin B-12 (CYANOCOBALAMIN) 1000 MCG tablet, Take 1,000 mcg by mouth daily., Disp: , Rfl:  .  pantoprazole (PROTONIX) 40 MG tablet, Take 1 tablet (40 mg total) by mouth daily. (Patient not taking: Reported on 01/12/2020), Disp: 30 tablet, Rfl: 0  Physical exam:  Vitals:    01/12/20 1020  BP: (!) 162/109  Pulse: 79  Temp: (!) 96.7 F (35.9 C)  TempSrc: Tympanic  Weight: 299 lb (135.6 kg)  Height: 6\' 3"  (1.905 m)   Physical Exam Constitutional:      General: He is not in acute distress. HENT:     Head: Normocephalic and atraumatic.  Eyes:     Pupils: Pupils are equal, round, and reactive to light.  Cardiovascular:     Rate and Rhythm: Normal  rate and regular rhythm.     Heart sounds: Normal heart sounds.  Pulmonary:     Effort: Pulmonary effort is normal.     Breath sounds: Normal breath sounds.  Abdominal:     General: Bowel sounds are normal.     Palpations: Abdomen is soft.  Musculoskeletal:     Cervical back: Normal range of motion.  Skin:    General: Skin is warm and dry.  Neurological:     Mental Status: He is alert and oriented to person, place, and time.      CMP Latest Ref Rng & Units 06/07/2019  Glucose 70 - 99 mg/dL 141(H)  BUN 8 - 23 mg/dL 9  Creatinine 0.61 - 1.24 mg/dL 0.72  Sodium 135 - 145 mmol/L 137  Potassium 3.5 - 5.1 mmol/L 4.3  Chloride 98 - 111 mmol/L 101  CO2 22 - 32 mmol/L 28  Calcium 8.9 - 10.3 mg/dL 10.4(H)  Total Protein 6.5 - 8.1 g/dL -  Total Bilirubin 0.3 - 1.2 mg/dL -  Alkaline Phos 38 - 126 U/L -  AST 15 - 41 U/L -  ALT 17 - 63 U/L -   CBC Latest Ref Rng & Units 01/12/2020  WBC 4.0 - 10.5 K/uL 7.9  Hemoglobin 13.0 - 17.0 g/dL 15.9  Hematocrit 39.0 - 52.0 % 52.1(H)  Platelets 150 - 400 K/uL 293      Assessment and plan- Patient is a 75 y.o. male with iron deficiency anemia secondary to intermittent diverticular bleed.  This is a routine follow-up visit  Patient's hemoglobin has remained stable around 15 for the last 2 years and he has not required any IV iron.  I have personally reviewed his labs done today.  Given the stability of his hemoglobin he does not require any hematology follow-up at this time and he can continue to follow-up with Dr. Raechel Ache.  If there is any concern for recurring iron  deficiency in the future we can be referred to [place]   Visit Diagnosis 1. Iron deficiency anemia, unspecified iron deficiency anemia type      Dr. Randa Evens, MD, MPH [facility] [id number] 01/14/2020 12:59 PM

## 2020-10-03 ENCOUNTER — Other Ambulatory Visit: Payer: Self-pay

## 2020-10-03 ENCOUNTER — Encounter: Payer: Self-pay | Admitting: Ophthalmology

## 2020-10-06 ENCOUNTER — Other Ambulatory Visit
Admission: RE | Admit: 2020-10-06 | Discharge: 2020-10-06 | Disposition: A | Discharge status: Home / Self Care | Payer: Medicare HMO | Source: Ambulatory Visit | Attending: Ophthalmology | Admitting: Ophthalmology

## 2020-10-06 ENCOUNTER — Other Ambulatory Visit: Payer: Self-pay

## 2020-10-06 DIAGNOSIS — Z01812 Encounter for preprocedural laboratory examination: Secondary | ICD-10-CM | POA: Diagnosis present

## 2020-10-06 DIAGNOSIS — Z20822 Contact with and (suspected) exposure to covid-19: Secondary | ICD-10-CM | POA: Diagnosis not present

## 2020-10-06 LAB — SARS CORONAVIRUS 2 (TAT 6-24 HRS): SARS Coronavirus 2: NEGATIVE

## 2020-10-06 NOTE — Discharge Instructions (Signed)

## 2020-10-10 ENCOUNTER — Ambulatory Visit: Payer: Medicare HMO | Admitting: Anesthesiology

## 2020-10-10 ENCOUNTER — Encounter: Payer: Self-pay | Admitting: Ophthalmology

## 2020-10-10 ENCOUNTER — Other Ambulatory Visit: Payer: Self-pay

## 2020-10-10 ENCOUNTER — Ambulatory Visit
Admission: RE | Admit: 2020-10-10 | Discharge: 2020-10-10 | Disposition: A | Discharge status: Home / Self Care | Payer: Medicare HMO | Attending: Ophthalmology | Admitting: Ophthalmology

## 2020-10-10 ENCOUNTER — Encounter
Admission: RE | Disposition: A | Discharge status: Home / Self Care | Payer: Self-pay | Source: Home / Self Care | Attending: Ophthalmology

## 2020-10-10 DIAGNOSIS — H401111 Primary open-angle glaucoma, right eye, mild stage: Secondary | ICD-10-CM | POA: Diagnosis not present

## 2020-10-10 DIAGNOSIS — Z6836 Body mass index (BMI) 36.0-36.9, adult: Secondary | ICD-10-CM | POA: Insufficient documentation

## 2020-10-10 DIAGNOSIS — G4730 Sleep apnea, unspecified: Secondary | ICD-10-CM | POA: Diagnosis not present

## 2020-10-10 DIAGNOSIS — H2511 Age-related nuclear cataract, right eye: Secondary | ICD-10-CM | POA: Insufficient documentation

## 2020-10-10 DIAGNOSIS — Z96659 Presence of unspecified artificial knee joint: Secondary | ICD-10-CM | POA: Insufficient documentation

## 2020-10-10 DIAGNOSIS — J449 Chronic obstructive pulmonary disease, unspecified: Secondary | ICD-10-CM | POA: Insufficient documentation

## 2020-10-10 DIAGNOSIS — E669 Obesity, unspecified: Secondary | ICD-10-CM | POA: Insufficient documentation

## 2020-10-10 DIAGNOSIS — H9190 Unspecified hearing loss, unspecified ear: Secondary | ICD-10-CM | POA: Insufficient documentation

## 2020-10-10 DIAGNOSIS — E1136 Type 2 diabetes mellitus with diabetic cataract: Secondary | ICD-10-CM | POA: Insufficient documentation

## 2020-10-10 DIAGNOSIS — Z7982 Long term (current) use of aspirin: Secondary | ICD-10-CM | POA: Diagnosis not present

## 2020-10-10 DIAGNOSIS — I10 Essential (primary) hypertension: Secondary | ICD-10-CM | POA: Insufficient documentation

## 2020-10-10 DIAGNOSIS — Z87891 Personal history of nicotine dependence: Secondary | ICD-10-CM | POA: Insufficient documentation

## 2020-10-10 DIAGNOSIS — Z9049 Acquired absence of other specified parts of digestive tract: Secondary | ICD-10-CM | POA: Diagnosis not present

## 2020-10-10 DIAGNOSIS — Z7984 Long term (current) use of oral hypoglycemic drugs: Secondary | ICD-10-CM | POA: Diagnosis not present

## 2020-10-10 HISTORY — PX: CATARACT EXTRACTION W/PHACO: SHX586

## 2020-10-10 LAB — GLUCOSE, CAPILLARY
Glucose-Capillary: 150 mg/dL — ABNORMAL HIGH (ref 70–99)
Glucose-Capillary: 158 mg/dL — ABNORMAL HIGH (ref 70–99)

## 2020-10-10 SURGERY — PHACOEMULSIFICATION, CATARACT, WITH IOL INSERTION
Anesthesia: Monitor Anesthesia Care | Site: Eye | Laterality: Right

## 2020-10-10 MED ORDER — EPINEPHRINE PF 1 MG/ML IJ SOLN
INTRAOCULAR | Status: DC | PRN
  Administered 2020-10-10: 88 mL via OPHTHALMIC

## 2020-10-10 MED ORDER — SODIUM HYALURONATE 10 MG/ML IO SOLN
INTRAOCULAR | Status: DC | PRN
  Administered 2020-10-10: 0.55 mL via INTRAOCULAR

## 2020-10-10 MED ORDER — MIDAZOLAM HCL 2 MG/2ML IJ SOLN
INTRAMUSCULAR | Status: DC | PRN
  Administered 2020-10-10 (×2): 1 mg via INTRAVENOUS

## 2020-10-10 MED ORDER — ARMC OPHTHALMIC DILATING DROPS
1.0000 "application " | OPHTHALMIC | Status: DC | PRN
  Administered 2020-10-10 (×3): 1 via OPHTHALMIC

## 2020-10-10 MED ORDER — SODIUM HYALURONATE 23 MG/ML IO SOLN
INTRAOCULAR | Status: DC | PRN
  Administered 2020-10-10: 0.6 mL via INTRAOCULAR

## 2020-10-10 MED ORDER — FENTANYL CITRATE (PF) 100 MCG/2ML IJ SOLN
INTRAMUSCULAR | Status: DC | PRN
  Administered 2020-10-10 (×2): 50 ug via INTRAVENOUS

## 2020-10-10 MED ORDER — LIDOCAINE HCL (PF) 2 % IJ SOLN
INTRAOCULAR | Status: DC | PRN
  Administered 2020-10-10: 1 mL via INTRAOCULAR

## 2020-10-10 MED ORDER — LACTATED RINGERS IV SOLN: INTRAVENOUS | Status: DC

## 2020-10-10 MED ORDER — MOXIFLOXACIN HCL 0.5 % OP SOLN
OPHTHALMIC | Status: DC | PRN
  Administered 2020-10-10: 0.2 mL via OPHTHALMIC

## 2020-10-10 MED ORDER — TETRACAINE HCL 0.5 % OP SOLN
1.0000 [drp] | OPHTHALMIC | Status: DC | PRN
  Administered 2020-10-10 (×3): 1 [drp] via OPHTHALMIC

## 2020-10-10 SURGICAL SUPPLY — 20 items
CANNULA ANT/CHMB 27GA (MISCELLANEOUS) ×6 IMPLANT
DEVICE INJECT ISTENT W (Stent) ×1 IMPLANT
DISSECTOR HYDRO NUCLEUS 50X22 (MISCELLANEOUS) ×3 IMPLANT
GLOVE SURG LX 7.5 STRW (GLOVE) ×2
GLOVE SURG LX STRL 7.5 STRW (GLOVE) ×1 IMPLANT
GLOVE SURG SYN 8.5  E (GLOVE) ×2
GLOVE SURG SYN 8.5 E (GLOVE) ×1 IMPLANT
GOWN STRL REUS W/ TWL LRG LVL3 (GOWN DISPOSABLE) ×2 IMPLANT
GOWN STRL REUS W/TWL LRG LVL3 (GOWN DISPOSABLE) ×6
ICLIP (OPHTHALMIC RELATED) ×3 IMPLANT
INJECT ISTENT W (Stent) ×3 IMPLANT
LENS IOL TECNIS EYHANCE 20.0 (Intraocular Lens) ×3 IMPLANT
MARKER SKIN DUAL TIP RULER LAB (MISCELLANEOUS) ×3 IMPLANT
PACK DR. KING ARMS (PACKS) ×3 IMPLANT
PACK EYE AFTER SURG (MISCELLANEOUS) ×3 IMPLANT
PACK OPTHALMIC (MISCELLANEOUS) ×3 IMPLANT
SYR 3ML LL SCALE MARK (SYRINGE) ×3 IMPLANT
SYR TB 1ML LUER SLIP (SYRINGE) ×3 IMPLANT
WATER STERILE IRR 250ML POUR (IV SOLUTION) ×3 IMPLANT
WIPE NON LINTING 3.25X3.25 (MISCELLANEOUS) ×3 IMPLANT

## 2020-10-10 NOTE — Transfer of Care (Signed)
Immediate Anesthesia Transfer of Care Note  Patient: Evan [name]  Procedure(s) Performed: CATARACT EXTRACTION PHACO AND INTRAOCULAR LENS PLACEMENT (IOC) RIGHT ISTENT INJ DIABETIC 2.64  00:29.4 (Right Eye)  Patient Location: PACU  Anesthesia Type: MAC  Level of Consciousness: awake, alert  and patient cooperative  Airway and Oxygen Therapy: Patient Spontanous Breathing and Patient connected to supplemental oxygen  Post-op Assessment: Post-op Vital signs reviewed, Patient's Cardiovascular Status Stable, Respiratory Function Stable, Patent Airway and No signs of Nausea or vomiting  Post-op Vital Signs: Reviewed and stable  Complications: No complications documented.

## 2020-10-10 NOTE — Anesthesia Postprocedure Evaluation (Signed)
Anesthesia Post Note  Patient: Evan [name]  Procedure(s) Performed: CATARACT EXTRACTION PHACO AND INTRAOCULAR LENS PLACEMENT (IOC) RIGHT ISTENT INJ DIABETIC 2.64  00:29.4 (Right Eye)     Patient location during evaluation: PACU Anesthesia Type: MAC Level of consciousness: awake and alert Pain management: pain level controlled Vital Signs Assessment: post-procedure vital signs reviewed and stable Respiratory status: spontaneous breathing Cardiovascular status: blood pressure returned to baseline Postop Assessment: no apparent nausea or vomiting, adequate PO intake and no headache Anesthetic complications: no   No complications documented.  Adele Barthel Breanna Shorkey

## 2020-10-10 NOTE — Op Note (Signed)
OPERATIVE NOTE  Evan [name] [id number] 10/10/2020  PREOPERATIVE DIAGNOSIS:   1.  Mild PRIMARY open angle glaucoma, right eye.  2.  Nuclear sclerotic cataract right eye.  H25.11   POSTOPERATIVE DIAGNOSIS:    same.   PROCEDURE:   1.  Placement of trabecular bypass stent (istent). CPT 0191T  and placement of additional stent  CPT 0376T 2.  Phacoemusification with posterior chamber intraocular lens placement of the right eye  CPT [phone]   LENS: Implant Name Type Inv. Item Serial No. Manufacturer Lot No. LRB No. Used Action  LENS II EYHANCE 20.0 - [id number] Intraocular Lens LENS II EYHANCE 20.0 [id number]   Right 1 Implanted      Procedure(s) with comments: CATARACT EXTRACTION PHACO AND INTRAOCULAR LENS PLACEMENT (IOC) RIGHT ISTENT INJ DIABETIC 2.64  00:29.4 (Right) - Diabetic - oral meds  DIB00 +20.0  ULTRASOUND TIME: 0 minutes 29 seconds.  CDE 2.64   SURGEON:  Benay Pillow, MD, MPH  ANESTHESIOLOGIST: Anesthesiologist: Page, Adele Barthel, MD CRNA: Georga Bora, CRNA   ANESTHESIA:  MAC and intracameral preservative-free lidocaine 4%.  ESTIMATED BLOOD LOSS: less than 1 mL.   COMPLICATIONS:  None.   DESCRIPTION OF PROCEDURE:  The patient was identified in the holding room and transported to the operating room.   The patient was placed in the supine position under the operating microscope.  The right eye was prepped and draped in the usual sterile ophthalmic fashion.   A 1.0 millimeter clear-corneal paracentesis was made at the 10:30 position. 0.5 ml of preservative-free 1% lidocaine with epinephrine was injected into the anterior chamber.  The anterior chamber was filled with Healon 5 viscoelastic.  A 2.4 millimeter keratome was used to make a near-clear corneal incision at the 8:00 position.   Attention was turned to the phacoemulsification A curvilinear capsulorrhexis was made with a cystotome and capsulorrhexis forceps.  Balanced salt solution was used to  hydrodissect and hydrodelineate the nucleus.   Phacoemulsification was then used in stop and chop fashion to remove the lens nucleus and epinucleus.  The remaining cortex was then removed using the irrigation and aspiration handpiece. Healon was then placed into the capsular bag to distend it for lens placement.  A lens was then injected into the capsular bag.    Attention was turned to the istent.  The patients head was turned to the left and the microscope was tilted to 035 degrees.  Ocular instruments/Glaukos OAL/H2 gonioprism was used with IPC05 (iclip) coupled with Healon 5 on the cornea was used to visualize the trabecular meshwork. The istent was opened and introduced into the eye.  The meshwork was engaged with the tip of the iStent injector and the stent was deployed into Schlemm's canal at 2:00.  The second stent was deployed at 4:00.  The stents were well seated and in good position.  The viscoelastic was aspirated.   Wounds were hydrated with balanced salt solution.  The anterior chamber was inflated to a physiologic pressure with balanced salt solution.   Intracameral vigamox 0.1 mL undiluted was injected into the eye and a drop placed onto the ocular surface.  No wound leaks were noted. The patient was taken to the recovery room in stable condition without complications of anesthesia or surgery   Benay Pillow 10/10/2020, 8:14 AM

## 2020-10-10 NOTE — Anesthesia Preprocedure Evaluation (Signed)
Anesthesia Evaluation  Patient identified by MRN, date of birth, ID band Patient awake    History of Anesthesia Complications Negative for: history of anesthetic complications  Airway Mallampati: II  TM Distance: >3 FB Neck ROM: Full    Dental no notable dental hx.    Pulmonary sleep apnea and Continuous Positive Airway Pressure Ventilation , COPD (stage II), former smoker,    Pulmonary exam normal        Cardiovascular Exercise Tolerance: Good hypertension, Pt. on medications Normal cardiovascular exam     Neuro/Psych negative neurological ROS     GI/Hepatic Neg liver ROS, Prior partial colectomy for diverticular bleed   Endo/Other  diabetes, Type 2Obesity (BMI 36)  Renal/GU negative Renal ROS     Musculoskeletal   Abdominal   Peds  Hematology negative hematology ROS (+)   Anesthesia Other Findings   Reproductive/Obstetrics                             Anesthesia Physical Anesthesia Plan  ASA: II  Anesthesia Plan: MAC   Post-op Pain Management:    Induction: Intravenous  PONV Risk Score and Plan: 1 and Midazolam, TIVA and Treatment may vary due to age or medical condition  Airway Management Planned: Nasal Cannula and Natural Airway  Additional Equipment: None  Intra-op Plan:   Post-operative Plan:   Informed Consent: I have reviewed the patients History and Physical, chart, labs and discussed the procedure including the risks, benefits and alternatives for the proposed anesthesia with the patient or authorized representative who has indicated his/her understanding and acceptance.       Plan Discussed with: CRNA  Anesthesia Plan Comments:         Anesthesia Quick Evaluation

## 2020-10-10 NOTE — H&P (Signed)

## 2020-10-10 NOTE — Anesthesia Procedure Notes (Signed)
Procedure Name: MAC Date/Time: 10/10/2020 7:48 AM Performed by: Georga Bora, CRNA Pre-anesthesia Checklist: Patient identified, Emergency Drugs available, Suction available, Patient being monitored and Timeout performed Oxygen Delivery Method: Nasal cannula Preoxygenation: Pre-oxygenation with 100% oxygen

## 2020-10-11 ENCOUNTER — Encounter: Payer: Self-pay | Admitting: Ophthalmology

## 2021-02-28 ENCOUNTER — Other Ambulatory Visit: Payer: Self-pay

## 2021-02-28 ENCOUNTER — Encounter: Payer: Self-pay | Admitting: Ophthalmology

## 2021-03-02 ENCOUNTER — Other Ambulatory Visit: Payer: Self-pay

## 2021-03-02 ENCOUNTER — Other Ambulatory Visit
Admission: RE | Admit: 2021-03-02 | Discharge: 2021-03-02 | Disposition: A | Discharge status: Home / Self Care | Payer: Medicare HMO | Source: Ambulatory Visit | Attending: Ophthalmology | Admitting: Ophthalmology

## 2021-03-02 DIAGNOSIS — Z01812 Encounter for preprocedural laboratory examination: Secondary | ICD-10-CM | POA: Insufficient documentation

## 2021-03-02 DIAGNOSIS — Z20822 Contact with and (suspected) exposure to covid-19: Secondary | ICD-10-CM | POA: Insufficient documentation

## 2021-03-02 LAB — SARS CORONAVIRUS 2 (TAT 6-24 HRS): SARS Coronavirus 2: NEGATIVE

## 2021-03-02 NOTE — Discharge Instructions (Signed)

## 2021-03-06 ENCOUNTER — Ambulatory Visit
Admission: RE | Admit: 2021-03-06 | Discharge: 2021-03-06 | Disposition: A | Discharge status: Home / Self Care | Payer: Medicare HMO | Attending: Ophthalmology | Admitting: Ophthalmology

## 2021-03-06 ENCOUNTER — Other Ambulatory Visit: Payer: Self-pay

## 2021-03-06 ENCOUNTER — Ambulatory Visit: Payer: Medicare HMO | Admitting: Anesthesiology

## 2021-03-06 ENCOUNTER — Encounter: Payer: Self-pay | Admitting: Ophthalmology

## 2021-03-06 ENCOUNTER — Encounter
Admission: RE | Disposition: A | Discharge status: Home / Self Care | Payer: Self-pay | Source: Home / Self Care | Attending: Ophthalmology

## 2021-03-06 DIAGNOSIS — Z881 Allergy status to other antibiotic agents status: Secondary | ICD-10-CM | POA: Diagnosis not present

## 2021-03-06 DIAGNOSIS — Z87891 Personal history of nicotine dependence: Secondary | ICD-10-CM | POA: Diagnosis not present

## 2021-03-06 DIAGNOSIS — E1136 Type 2 diabetes mellitus with diabetic cataract: Secondary | ICD-10-CM | POA: Insufficient documentation

## 2021-03-06 DIAGNOSIS — H2512 Age-related nuclear cataract, left eye: Secondary | ICD-10-CM | POA: Diagnosis not present

## 2021-03-06 DIAGNOSIS — Z7982 Long term (current) use of aspirin: Secondary | ICD-10-CM | POA: Insufficient documentation

## 2021-03-06 DIAGNOSIS — Z7984 Long term (current) use of oral hypoglycemic drugs: Secondary | ICD-10-CM | POA: Diagnosis not present

## 2021-03-06 DIAGNOSIS — Z880 Allergy status to penicillin: Secondary | ICD-10-CM | POA: Diagnosis not present

## 2021-03-06 DIAGNOSIS — H401121 Primary open-angle glaucoma, left eye, mild stage: Secondary | ICD-10-CM | POA: Diagnosis not present

## 2021-03-06 DIAGNOSIS — Z888 Allergy status to other drugs, medicaments and biological substances status: Secondary | ICD-10-CM | POA: Diagnosis not present

## 2021-03-06 HISTORY — PX: CATARACT EXTRACTION W/PHACO: SHX586

## 2021-03-06 LAB — GLUCOSE, CAPILLARY
Glucose-Capillary: 133 mg/dL — ABNORMAL HIGH (ref 70–99)
Glucose-Capillary: 135 mg/dL — ABNORMAL HIGH (ref 70–99)

## 2021-03-06 SURGERY — PHACOEMULSIFICATION, CATARACT, WITH IOL INSERTION
Anesthesia: Monitor Anesthesia Care | Site: Eye | Laterality: Left

## 2021-03-06 MED ORDER — LIDOCAINE HCL (PF) 2 % IJ SOLN
INTRAOCULAR | Status: DC | PRN
  Administered 2021-03-06: 4 mL via INTRAOCULAR

## 2021-03-06 MED ORDER — ARMC OPHTHALMIC DILATING DROPS
1.0000 "application " | OPHTHALMIC | Status: DC | PRN
  Administered 2021-03-06 (×3): 1 via OPHTHALMIC

## 2021-03-06 MED ORDER — MIDAZOLAM HCL 2 MG/2ML IJ SOLN
INTRAMUSCULAR | Status: DC | PRN
  Administered 2021-03-06: 2 mg via INTRAVENOUS

## 2021-03-06 MED ORDER — ACETAMINOPHEN 325 MG PO TABS: 325.0000 mg | ORAL_TABLET | Freq: Once | ORAL | Status: DC

## 2021-03-06 MED ORDER — LACTATED RINGERS IV SOLN: INTRAVENOUS | Status: DC

## 2021-03-06 MED ORDER — SODIUM HYALURONATE 23 MG/ML IO SOLN
INTRAOCULAR | Status: DC | PRN
  Administered 2021-03-06: 0.6 mL via INTRAOCULAR

## 2021-03-06 MED ORDER — TETRACAINE HCL 0.5 % OP SOLN
1.0000 [drp] | OPHTHALMIC | Status: DC | PRN
  Administered 2021-03-06 (×3): 1 [drp] via OPHTHALMIC

## 2021-03-06 MED ORDER — EPINEPHRINE PF 1 MG/ML IJ SOLN
INTRAOCULAR | Status: DC | PRN
  Administered 2021-03-06: 64 mL via OPHTHALMIC

## 2021-03-06 MED ORDER — MOXIFLOXACIN HCL 0.5 % OP SOLN
OPHTHALMIC | Status: DC | PRN
  Administered 2021-03-06: 0.2 mL via OPHTHALMIC

## 2021-03-06 MED ORDER — FENTANYL CITRATE (PF) 100 MCG/2ML IJ SOLN
INTRAMUSCULAR | Status: DC | PRN
  Administered 2021-03-06 (×2): 50 ug via INTRAVENOUS

## 2021-03-06 MED ORDER — ACETAMINOPHEN 160 MG/5ML PO SOLN: 325.0000 mg | Freq: Once | ORAL | Status: DC

## 2021-03-06 MED ORDER — SODIUM HYALURONATE 10 MG/ML IO SOLN
INTRAOCULAR | Status: DC | PRN
  Administered 2021-03-06: 0.55 mL via INTRAOCULAR

## 2021-03-06 SURGICAL SUPPLY — 22 items
CANNULA ANT/CHMB 27G (MISCELLANEOUS) ×2 IMPLANT
CANNULA ANT/CHMB 27GA (MISCELLANEOUS) ×4 IMPLANT
DEVICE INJECT ISTENT W (Stent) IMPLANT
DISSECTOR HYDRO NUCLEUS 50X22 (MISCELLANEOUS) ×2 IMPLANT
GLOVE SURG LX 7.5 STRW (GLOVE) ×1
GLOVE SURG LX STRL 7.5 STRW (GLOVE) ×1 IMPLANT
GLOVE SURG SYN 8.5  E (GLOVE) ×1
GLOVE SURG SYN 8.5 E (GLOVE) ×1 IMPLANT
GLOVE SURG SYN 8.5 PF PI (GLOVE) ×1 IMPLANT
GOWN STRL REUS W/ TWL LRG LVL3 (GOWN DISPOSABLE) ×2 IMPLANT
GOWN STRL REUS W/TWL LRG LVL3 (GOWN DISPOSABLE) ×4
ICLIP (OPHTHALMIC RELATED) ×1 IMPLANT
INJECT ISTENT W (Stent) ×2 IMPLANT
LENS IOL TECNIS EYHANCE 20.0 (Intraocular Lens) ×1 IMPLANT
MARKER SKIN DUAL TIP RULER LAB (MISCELLANEOUS) ×2 IMPLANT
PACK DR. KING ARMS (PACKS) ×2 IMPLANT
PACK EYE AFTER SURG (MISCELLANEOUS) ×2 IMPLANT
PACK OPTHALMIC (MISCELLANEOUS) ×2 IMPLANT
SYR 3ML LL SCALE MARK (SYRINGE) ×2 IMPLANT
SYR TB 1ML LUER SLIP (SYRINGE) ×2 IMPLANT
WATER STERILE IRR 250ML POUR (IV SOLUTION) ×2 IMPLANT
WIPE NON LINTING 3.25X3.25 (MISCELLANEOUS) ×2 IMPLANT

## 2021-03-06 NOTE — Anesthesia Procedure Notes (Signed)
Procedure Name: MAC Date/Time: 03/06/2021 8:35 AM Performed by: Jeannene Patella, CRNA Pre-anesthesia Checklist: Patient identified, Emergency Drugs available, Suction available, Timeout performed and Patient being monitored Patient Re-evaluated:Patient Re-evaluated prior to induction Oxygen Delivery Method: Nasal cannula Placement Confirmation: positive ETCO2

## 2021-03-06 NOTE — Op Note (Signed)
OPERATIVE NOTE  Evan [name] [id number] 03/06/2021  PREOPERATIVE DIAGNOSIS:   1.  Mild  PRIMARY open angle glaucoma, left eye. Q22.9798 2.  Nuclear sclerotic cataract left eye.  H25.12   POSTOPERATIVE DIAGNOSIS:    same.   PROCEDURE:   1.  Placement of trabecular bypass stent (istent) and phacoemusification with posterior chamber intraocular lens placement of the left eye  CPT [phone]   LENS: Implant Name Type Inv. Item Serial No. Manufacturer Lot No. LRB No. Used Action  LENS IOL TECNIS EYHANCE 20.0 - [id number] Intraocular Lens LENS IOL TECNIS EYHANCE 20.0 [id number] [facility]   Left 1 Implanted  Marko Stai - [id number] Stent Marko Stai [id number] [place] [id number] Left 1 Implanted      Procedure(s) with comments: CATARACT EXTRACTION PHACO AND INTRAOCULAR LENS PLACEMENT (IOC) LEFT DIABETIC ISTENT INJ 3.62 00:30.6 (Left) - Diabetic - oral meds  DIB00 +20.0  ULTRASOUND TIME: 0 minutes 30 seconds.  CDE 3.62   SURGEON:  Benay Pillow, MD, MPH  ANESTHESIOLOGIST: Anesthesiologist: Ronelle Nigh, MD CRNA: Jeannene Patella, CRNA   ANESTHESIA:  MAC  and intracameral preservative-free intracameral lidocaine 4%.  ESTIMATED BLOOD LOSS: less than 1 mL.   COMPLICATIONS:  None.   DESCRIPTION OF PROCEDURE:  The patient was identified in the holding room and transported to the operating room.  The patient was placed in the supine position under the operating microscope.  The left eye was prepped and draped in the usual sterile ophthalmic fashion.   A 1.0 millimeter clear-corneal paracentesis was made at the 4:30 position. 0.5 ml of preservative-free 1% lidocaine with epinephrine was injected into the anterior chamber.  The anterior chamber was filled with Healon 5 viscoelastic.  A 2.4 millimeter keratome was used to make a near-clear corneal incision at the 2:00 position.   Attention was turned to the istent.  The patients head was turned to the left and the  microscope was tilted to 035 degrees.  Ocular instruments/Glaukos OAL/H2 gonioprism was used with IPC05 (iclip) coupled with Healon 5 on the cornea was used to visualize the trabecular meshwork. The istent was opened and introduced into the eye.  The meshwork was engaged with the tip of the iStent injector and the stent was deployed into Schlemm's canal at 10:30.  The second stent was deployed at 8:00.  The stents were well seated and in good position.  Next, attention was turned to the phacoemulsification A curvilinear capsulorrhexis was made with a cystotome and capsulorrhexis forceps.  Balanced salt solution was used to hydrodissect and hydrodelineate the nucleus.   Phacoemulsification was then used in stop and chop fashion to remove the lens nucleus and epinucleus.  The remaining cortex was then removed using the irrigation and aspiration handpiece. Healon was then placed into the capsular bag to distend it for lens placement.  A lens was then injected into the capsular bag.  The remaining viscoelastic was aspirated.   Wounds were hydrated with balanced salt solution.  The anterior chamber was inflated to a physiologic pressure with balanced salt solution.   Intracameral vigamox 0.1 mL undiluted was injected into the eye and a drop placed onto the ocular surface.  No wound leaks were noted.  Protective glasses were placed on the patient.  The patient was taken to the recovery room in stable condition without complications of anesthesia or surgery   Benay Pillow 03/06/2021, 8:58 AM

## 2021-03-06 NOTE — Anesthesia Preprocedure Evaluation (Signed)
Anesthesia Evaluation  Patient identified by MRN, date of birth, ID band Patient awake    Reviewed: Allergy & Precautions, H&P , NPO status , Patient's Chart, lab work & pertinent test results  History of Anesthesia Complications Negative for: history of anesthetic complications  Airway Mallampati: II  TM Distance: >3 FB Neck ROM: full    Dental no notable dental hx.    Pulmonary sleep apnea and Continuous Positive Airway Pressure Ventilation , COPD (stage II), former smoker,    Pulmonary exam normal breath sounds clear to auscultation       Cardiovascular Exercise Tolerance: Good hypertension, Pt. on medications Normal cardiovascular exam Rhythm:regular Rate:Normal     Neuro/Psych negative neurological ROS     GI/Hepatic Neg liver ROS, Prior partial colectomy for diverticular bleed   Endo/Other  diabetes, Type 2Obesity (BMI 36)  Renal/GU negative Renal ROS     Musculoskeletal   Abdominal   Peds  Hematology negative hematology ROS (+)   Anesthesia Other Findings   Reproductive/Obstetrics                             Anesthesia Physical  Anesthesia Plan  ASA: III  Anesthesia Plan: MAC   Post-op Pain Management:    Induction: Intravenous  PONV Risk Score and Plan: 1 and Treatment may vary due to age or medical condition, TIVA and Midazolam  Airway Management Planned: Nasal Cannula and Natural Airway  Additional Equipment: None  Intra-op Plan:   Post-operative Plan:   Informed Consent: I have reviewed the patients History and Physical, chart, labs and discussed the procedure including the risks, benefits and alternatives for the proposed anesthesia with the patient or authorized representative who has indicated his/her understanding and acceptance.     Dental Advisory Given  Plan Discussed with: CRNA  Anesthesia Plan Comments:         Anesthesia Quick  Evaluation

## 2021-03-06 NOTE — Transfer of Care (Signed)
Immediate Anesthesia Transfer of Care Note  Patient: Evan [name]  Procedure(s) Performed: CATARACT EXTRACTION PHACO AND INTRAOCULAR LENS PLACEMENT (IOC) LEFT DIABETIC ISTENT INJ 3.62 00:30.6 (Left Eye)  Patient Location: PACU  Anesthesia Type: MAC  Level of Consciousness: awake, alert  and patient cooperative  Airway and Oxygen Therapy: Patient Spontanous Breathing and Patient connected to supplemental oxygen  Post-op Assessment: Post-op Vital signs reviewed, Patient's Cardiovascular Status Stable, Respiratory Function Stable, Patent Airway and No signs of Nausea or vomiting  Post-op Vital Signs: Reviewed and stable  Complications: No complications documented.

## 2021-03-06 NOTE — Anesthesia Postprocedure Evaluation (Signed)
Anesthesia Post Note  Patient: Evan [name]  Procedure(s) Performed: CATARACT EXTRACTION PHACO AND INTRAOCULAR LENS PLACEMENT (IOC) LEFT DIABETIC ISTENT INJ 3.62 00:30.6 (Left Eye)     Patient location during evaluation: PACU Anesthesia Type: MAC Level of consciousness: awake and alert and oriented Pain management: satisfactory to patient Vital Signs Assessment: post-procedure vital signs reviewed and stable Respiratory status: spontaneous breathing, nonlabored ventilation and respiratory function stable Cardiovascular status: blood pressure returned to baseline and stable Postop Assessment: Adequate PO intake and No signs of nausea or vomiting Anesthetic complications: no   No complications documented.  Raliegh Ip

## 2021-03-06 NOTE — H&P (Signed)
[facility]   Primary Care Physician:  Ezequiel Kayser, MD Ophthalmologist: Dr. Benay Pillow  Pre-Procedure History & Physical: HPI:  Evan [name] is a 76 y.o. male here for cataract surgery.   Past Medical History:  Diagnosis Date  . Arthritis    xDJD  . Atrial fibrillation ([place])   . BPH (benign prostatic hyperplasia)   . CHF (congestive heart failure) ([place])   . Diabetes mellitus without complication ([place])   . Discitis   . Diverticulitis   . Diverticulosis   . DVT (deep venous thrombosis) ([place])   . Erectile dysfunction   . Hearing loss   . Hyperlipidemia   . Hypertension   . Ischemic optic neuropathy   . Lower GI bleeding   . Non-ischemic cardiomyopathy ([place])   . Sleep apnea     Past Surgical History:  Procedure Laterality Date  . ATRIAL ABLATION SURGERY    . CATARACT EXTRACTION W/PHACO Right 10/10/2020   Procedure: CATARACT EXTRACTION PHACO AND INTRAOCULAR LENS PLACEMENT (IOC) RIGHT ISTENT INJ DIABETIC 2.64  00:29.4;  Surgeon: Eulogio Bear, MD;  Location: [place];  Service: Ophthalmology;  Laterality: Right;  Diabetic - oral meds  . CHOLECYSTECTOMY    . COLONOSCOPY Left 02/24/2018   Procedure: COLONOSCOPY;  Surgeon: Virgel Manifold, MD;  Location: [facility] ENDOSCOPY;  Service: Endoscopy;  Laterality: Left;  . COLONOSCOPY WITH PROPOFOL N/A 09/25/2017   Procedure: COLONOSCOPY WITH PROPOFOL;  Surgeon: Manya Silvas, MD;  Location: [facility] ENDOSCOPY;  Service: Endoscopy;  Laterality: N/A;  . COLONOSCOPY WITH PROPOFOL N/A 02/18/2018   Procedure: COLONOSCOPY WITH PROPOFOL;  Surgeon: [name], MD;  Location: [facility] ENDOSCOPY;  Service: Endoscopy;  Laterality: N/A;  . ESOPHAGOGASTRODUODENOSCOPY Left 02/23/2018   Procedure: ESOPHAGOGASTRODUODENOSCOPY (EGD);  Surgeon: Virgel Manifold, MD;  Location: [facility] ENDOSCOPY;  Service: Endoscopy;  Laterality: Left;  . JOINT REPLACEMENT Right    knee  . LAPAROTOMY  01/01/2017   Procedure: EXPLORATORY LAPAROTOMY;   Surgeon: Olean Ree, MD;  Location: [facility] ORS;  Service: General;;  . PARTIAL COLECTOMY N/A 01/01/2017   Procedure: PARTIAL COLECTOMY;  Surgeon: Olean Ree, MD;  Location: [facility] ORS;  Service: General;  Laterality: N/A;  . PERIPHERAL VASCULAR CATHETERIZATION N/A 12/28/2016   Procedure: Visceral Artery Intervention;  Surgeon: Algernon Huxley, MD;  Location: [place] [facility];  Service: Cardiovascular;  Laterality: N/A;  . SECONDARY CLOSURE OF WOUND N/A 01/08/2017   Procedure: SECONDARY CLOSURE FOR EVISCERATION;  Surgeon: Florene Glen, MD;  Location: [facility] ORS;  Service: General;  Laterality: N/A;  . SIGMOIDECTOMY    . TOTAL KNEE ARTHROPLASTY Right     Prior to Admission medications   Medication Sig Start Date End Date Taking? Authorizing Provider  acetaminophen (TYLENOL) 500 MG tablet Take 1,000 mg by mouth every 6 (six) hours as needed.   Yes [provider]  amLODipine (NORVASC) 5 MG tablet Take 5 mg by mouth daily. 05/03/16  Yes [provider]  aspirin EC 81 MG EC tablet Take 1 tablet (81 mg total) by mouth daily. 01/16/17  Yes Loflin, Lavona Mound, MD  Cholecalciferol (VITAMIN D3) 2000 units capsule Take 2,000 Units by mouth daily.   Yes [provider]  dorzolamide-timolol (COSOPT) 22.3-6.8 MG/ML ophthalmic solution Place 1 drop into both eyes 2 (two) times daily.    Yes [provider]  HYDROcodone-homatropine (HYCODAN) 5-1.5 MG/5ML syrup Take 5 mLs by mouth every 6 (six) hours as needed for cough.   Yes [provider]  hydrOXYzine (  ATARAX/VISTARIL) 25 MG tablet Take 25 mg by mouth daily.   Yes [provider]  latanoprost (XALATAN) 0.005 % ophthalmic solution Place 1 drop into both eyes at bedtime.    Yes [provider]  metFORMIN (GLUCOPHAGE-XR) 500 MG 24 hr tablet 1,000 mg daily with breakfast.  08/30/17  Yes [provider]  Multiple Vitamins-Minerals (CENTRUM SILVER PO) Take by mouth every morning.   Yes [provider]  polyethylene glycol (MIRALAX / GLYCOLAX) packet Take 17 g by mouth daily. 02/25/18  Yes Gouru, Illene Silver, MD  pravastatin (PRAVACHOL) 40 MG tablet Take 40 mg by mouth daily. 05/03/16  Yes [provider]  vitamin B-12 (CYANOCOBALAMIN) 1000 MCG tablet Take 1,000 mcg by mouth daily.   Yes [provider]    Allergies as of 11/18/2020 - Review Complete 10/10/2020  Allergen Reaction Noted  . Azithromycin Rash 07/09/2016  . Penicillins Rash 07/09/2016  . Lisinopril Cough 07/09/2016  . Losartan Cough 07/09/2016  . Metoprolol Other (See Comments) 07/09/2016    Family History  Problem Relation Age of Onset  . Diabetes Sister   . Diabetes Brother   . Diabetes Sister   . Diabetes Brother     Social History   Socioeconomic History  . Marital status: Married    Spouse name: Not on file  . Number of children: Not on file  . Years of education: Not on file  . Highest education level: Not on file  Occupational History  . Not on file  Tobacco Use  . Smoking status: Former Smoker    Packs/day: 0.25    Years: 29.00    Pack years: 7.25    Types: Cigarettes    Quit date: 12/31/1993    Years since quitting: 27.1  . Smokeless tobacco: Never Used  Vaping Use  . Vaping Use: Never used  Substance and Sexual Activity  . Alcohol use: No    Alcohol/week: 0.0 standard drinks  . Drug use: No  . Sexual activity: Not on file  Other Topics Concern  . Not on file  Social History Narrative  . Not on file   Social Determinants of Health   Financial Resource Strain: Not on file  Food Insecurity: Not on file  Transportation Needs: Not on file  Physical Activity: Not on file  Stress: Not on file  Social Connections: Not on file  Intimate Partner Violence: Not on file    Review of Systems: See HPI, otherwise negative ROS  Physical Exam: BP (!) 161/102   Pulse 87   Temp (!) 97.2 F (36.2 C)   Ht 6\' 4"  (1.93 m)   Wt 134.3 kg   SpO2 98%   BMI 36.03 kg/m   General:   Alert,  pleasant and cooperative in NAD Head:  Normocephalic and atraumatic. Respiratory:  Normal work of breathing.  Impression/Plan: Evan [name] is here for cataract surgery.  Risks, benefits, limitations, and alternatives regarding cataract surgery have been reviewed with the patient.  Questions have been answered.  All parties agreeable.   Benay Pillow, MD  03/06/2021, 8:25 AM

## 2022-03-27 ENCOUNTER — Encounter: Payer: Self-pay | Admitting: Oncology

## 2022-03-27 ENCOUNTER — Other Ambulatory Visit: Payer: Self-pay

## 2022-03-27 ENCOUNTER — Ambulatory Visit (INDEPENDENT_AMBULATORY_CARE_PROVIDER_SITE_OTHER): Payer: Medicare HMO | Admitting: Nurse Practitioner

## 2022-03-27 ENCOUNTER — Encounter (INDEPENDENT_AMBULATORY_CARE_PROVIDER_SITE_OTHER): Payer: Self-pay | Admitting: Nurse Practitioner

## 2022-03-27 ENCOUNTER — Encounter (INDEPENDENT_AMBULATORY_CARE_PROVIDER_SITE_OTHER): Payer: Self-pay | Admitting: Vascular Surgery

## 2022-03-27 VITALS — BP 145/81 | HR 54 | Resp 17 | Ht 75.75 in | Wt 289.9 lb

## 2022-03-27 DIAGNOSIS — I82543 Chronic embolism and thrombosis of tibial vein, bilateral: Secondary | ICD-10-CM | POA: Diagnosis not present

## 2022-03-30 ENCOUNTER — Other Ambulatory Visit: Payer: Self-pay | Admitting: Orthopedic Surgery

## 2022-04-04 ENCOUNTER — Telehealth (INDEPENDENT_AMBULATORY_CARE_PROVIDER_SITE_OTHER): Payer: Self-pay

## 2022-04-04 ENCOUNTER — Encounter (INDEPENDENT_AMBULATORY_CARE_PROVIDER_SITE_OTHER): Payer: Self-pay | Admitting: Nurse Practitioner

## 2022-04-04 NOTE — Telephone Encounter (Signed)
Spoke with the patient and he is scheduled on 04/17/22 with a 11:00 am arrival time to the [facility] with Dr. Lucky Cowboy for a port placement. Pre-procedure instructions were discussed and will be mailed. ?

## 2022-04-07 NOTE — Progress Notes (Signed)
? ?Subjective:  ? ? Patient ID: Evan [name], male    DOB: 1945-10-25, 77 y.o.   MRN: [id number] ?Chief Complaint  ?Patient presents with  ? Establish Care  ? ? ?Evan [name] is a 77 year old male that presents today for evaluation for IVC filter placement.  The patient previously had bilateral tibial DVTs.  He endorses having some swelling in his legs but it tends to not be an issue.  He notes that he is to have an upcoming hip replacement on 04/24/2022.  He currently denies any chest pain or shortness of breath. ? ? ?Review of Systems  ?Musculoskeletal:  Positive for arthralgias.  ?All other systems reviewed and are negative. ? ?   ?Objective:  ? Physical Exam ?Vitals reviewed.  ?HENT:  ?   Head: Normocephalic.  ?Cardiovascular:  ?   Rate and Rhythm: Normal rate.  ?   Pulses: Normal pulses.  ?Pulmonary:  ?   Effort: Pulmonary effort is normal.  ?Musculoskeletal:  ?   Right lower leg: Edema present.  ?   Left lower leg: Edema present.  ?Skin: ?   General: Skin is warm and dry.  ?Neurological:  ?   Mental Status: He is alert and oriented to person, place, and time.  ?Psychiatric:     ?   Mood and Affect: Mood normal.     ?   Behavior: Behavior normal.     ?   Thought Content: Thought content normal.     ?   Judgment: Judgment normal.  ? ? ?BP (!) 145/81 (BP Location: Left Arm)   Pulse (!) 54   Resp 17   Ht 6' 3.75" (1.924 m)   Wt 289 lb 14.4 oz (131.5 kg)   BMI 35.52 kg/m?  ? ?Past Medical History:  ?Diagnosis Date  ? 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 Atrial fibrillation ([place])   ? BPH (benign prostatic hyperplasia)   ? CHF (congestive heart failure) ([place])   ? Diabetes mellitus without complication ([place])   ? Discitis   ? Diverticulitis   ? Diverticulosis   ? DVT (deep venous thrombosis) ([place])   ? Erectile dysfunction   ? Hearing loss   ? Hyperlipidemia   ? Hypertension   ? Ischemic optic neuropathy   ? Lower GI bleeding   ? Non-ischemic cardiomyopathy ([place])   ? Sleep apnea   ? ? ?Social History  ? ?Socioeconomic History   ? Marital status: Married  ?  Spouse name: Not on file  ? Number of children: Not on file  ? Years of education: Not on file  ? Highest education level: Not on file  ?Occupational History  ? Not on file  ?Tobacco Use  ? Smoking status: Former  ?  Packs/day: 0.25  ?  Years: 29.00  ?  Pack years: 7.25  ?  Types: Cigarettes  ?  Quit date: 12/31/1993  ?  Years since quitting: 28.2  ? Smokeless tobacco: Never  ?Vaping Use  ? Vaping Use: Never used  ?Substance and Sexual Activity  ? Alcohol use: No  ?  Alcohol/week: 0.0 standard drinks  ? Drug use: No  ? Sexual activity: Not on file  ?Other Topics Concern  ? Not on file  ?Social History Narrative  ? Not on file  ? ?Social Determinants of Health  ? ?Financial Resource Strain: Not on file  ?Food Insecurity: Not on file  ?Transportation Needs: Not on file  ?Physical Activity: Not on file  ?Stress: Not on  file  ?Social Connections: Not on file  ?Intimate Partner Violence: Not on file  ? ? ?Past Surgical History:  ?Procedure Laterality Date  ? ATRIAL ABLATION SURGERY    ? CATARACT EXTRACTION W/PHACO Right 10/10/2020  ? Procedure: CATARACT EXTRACTION PHACO AND INTRAOCULAR LENS PLACEMENT (IOC) RIGHT ISTENT INJ DIABETIC 2.64  00:29.4;  Surgeon: Eulogio Bear, MD;  Location: [facility];  Service: Ophthalmology;  Laterality: Right;  Diabetic - oral meds  ? CATARACT EXTRACTION W/PHACO Left 03/06/2021  ? Procedure: CATARACT EXTRACTION PHACO AND INTRAOCULAR LENS PLACEMENT (IOC) LEFT DIABETIC ISTENT INJ 3.62 00:30.6;  Surgeon: Eulogio Bear, MD;  Location: [facility];  Service: Ophthalmology;  Laterality: Left;  Diabetic - oral meds  ? CHOLECYSTECTOMY    ? COLONOSCOPY Left 02/24/2018  ? Procedure: COLONOSCOPY;  Surgeon: Virgel Manifold, MD;  Location: [facility] ENDOSCOPY;  Service: Endoscopy;  Laterality: Left;  ? COLONOSCOPY WITH PROPOFOL N/A 09/25/2017  ? Procedure: COLONOSCOPY WITH PROPOFOL;  Surgeon: Manya Silvas, MD;  Location: [facility] ENDOSCOPY;  Service:  Endoscopy;  Laterality: N/A;  ? COLONOSCOPY WITH PROPOFOL N/A 02/18/2018  ? Procedure: COLONOSCOPY WITH PROPOFOL;  Surgeon: [name], MD;  Location: [facility] ENDOSCOPY;  Service: Endoscopy;  Laterality: N/A;  ? ESOPHAGOGASTRODUODENOSCOPY Left 02/23/2018  ? Procedure: ESOPHAGOGASTRODUODENOSCOPY (EGD);  Surgeon: Virgel Manifold, MD;  Location: [facility] ENDOSCOPY;  Service: Endoscopy;  Laterality: Left;  ? JOINT REPLACEMENT Right   ? knee  ? LAPAROTOMY  01/01/2017  ? Procedure: EXPLORATORY LAPAROTOMY;  Surgeon: Olean Ree, MD;  Location: [facility] ORS;  Service: General;;  ? PARTIAL COLECTOMY N/A 01/01/2017  ? Procedure: PARTIAL COLECTOMY;  Surgeon: Olean Ree, MD;  Location: [facility]S;  Service: General;  Laterality: N/A;  ? PERIPHERAL VASCULAR CATHETERIZATION N/A 12/28/2016  ? Procedure: Visceral Artery Intervention;  Surgeon: Algernon Huxley, MD;  Location: [place] [facility];  Service: Cardiovascular;  Laterality: N/A;  ? SECONDARY CLOSURE OF WOUND N/A 01/08/2017  ? Procedure: SECONDARY CLOSURE FOR EVISCERATION;  Surgeon: Florene Glen, MD;  Location: [facility] ORS;  Service: General;  Laterality: N/A;  ? SIGMOIDECTOMY    ? TOTAL KNEE ARTHROPLASTY Right   ? ? ?Family History  ?Problem Relation Age of Onset  ? Diabetes Sister   ? Diabetes Brother   ? Diabetes Sister   ? Diabetes Brother   ? ? ?Allergies  ?Allergen Reactions  ? Azithromycin Rash  ? Penicillins Rash  ?  Has patient had a PCN reaction causing immediate rash, facial/tongue/throat swelling, SOB or lightheadedness with hypotension: Yes ?Has patient had a PCN reaction causing severe rash involving mucus membranes or skin necrosis: Yes ?Has patient had a PCN reaction that required hospitalization No ?Has patient had a PCN reaction occurring within the last 10 years: No ?If all of the above answers are "NO", then may proceed with Cephalosporin use. ? ? ?  ? Lisinopril Cough  ? Losartan Cough  ? Metoprolol Other (See Comments)  ?  Bradycardia.  ? ? ? ?  Latest Ref Rng &  Units 01/12/2020  ?  9:49 AM 09/14/2019  ? 11:24 AM 06/07/2019  ?  1:26 PM  ?CBC  ?WBC 4.0 - 10.5 K/uL 7.9   7.7   10.3    ?Hemoglobin 13.0 - 17.0 g/dL 15.9   15.4   16.4    ?Hematocrit 39.0 - 52.0 % 52.1   48.1   50.5    ?Platelets 150 - 400 K/uL 293   283   291    ? ? ? ? ?  CMP  ?   ?Component Value Date/Time  ? NA 137 06/07/2019 1326  ? NA 136 03/26/2014 0418  ? K 4.3 06/07/2019 1326  ? K 4.0 03/26/2014 0418  ? CL 101 06/07/2019 1326  ? CL 102 03/26/2014 0418  ? CO2 28 06/07/2019 1326  ? CO2 34 (H) 03/26/2014 0418  ? GLUCOSE 141 (H) 06/07/2019 1326  ? GLUCOSE 114 (H) 03/26/2014 0418  ? BUN 9 06/07/2019 1326  ? BUN 8 03/26/2014 0418  ? CREATININE 0.72 06/07/2019 1326  ? CREATININE 0.90 03/26/2014 0418  ? CALCIUM 10.4 (H) 06/07/2019 1326  ? CALCIUM 9.2 03/26/2014 0418  ? PROT 5.9 (L) 02/22/2018 0510  ? PROT 6.3 (L) 10/04/2013 0533  ? ALBUMIN 3.4 (L) 02/22/2018 0510  ? ALBUMIN 3.0 (L) 10/04/2013 0533  ? AST 20 02/22/2018 0510  ? AST 8 (L) 10/04/2013 0533  ? ALT 18 02/22/2018 0510  ? ALT 17 10/04/2013 0533  ? ALKPHOS 64 02/22/2018 0510  ? ALKPHOS 92 10/04/2013 0533  ? BILITOT 0.8 02/22/2018 0510  ? BILITOT 1.7 (H) 10/04/2013 0533  ? GFRNONAA >60 06/07/2019 1326  ? GFRNONAA >60 03/26/2014 0418  ? GFRAA >60 06/07/2019 1326  ? GFRAA >60 03/26/2014 0418  ? ? ? ?No results found. ? ?   ?Assessment & Plan:  ? ?1. 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  We discussed the procedure in detail as well as the risk, benefits and alternatives.  Once the patient has been able to undergo his knee surgery approximately 6 weeks later we will have the patient follow-up with DVT studies ? ? ?Current Outpatient Medications on File Prior to Visit  ?Medication Sig Dispense Refill  ? acetaminophen (TYLENOL) 500 MG tablet Take 1,000 mg by mouth every 6 (six) hours as needed.     ? amLODipine (NORVASC) 5 MG tablet Take 5 mg by mouth daily.    ? aspirin EC 81 MG EC tablet Take 1 tablet (81 mg total) by mouth daily. 30 tablet 5  ? Cholecalciferol (VITAMIN D3) 2000 units capsule Tak

## 2022-04-07 NOTE — H&P (View-Only) (Signed)
? ?Subjective:  ? ? Patient ID: Evan [name], male    DOB: 1945-11-02, 77 y.o.   MRN: [id number] ?Chief Complaint  ?Patient presents with  ? Establish Care  ? ? ?Evan [name] is a 77 year old male that presents today for evaluation for IVC filter placement.  The patient previously had bilateral tibial DVTs.  He endorses having some swelling in his legs but it tends to not be an issue.  He notes that he is to have an upcoming hip replacement on 04/24/2022.  He currently denies any chest pain or shortness of breath. ? ? ?Review of Systems  ?Musculoskeletal:  Positive for arthralgias.  ?All other systems reviewed and are negative. ? ?   ?Objective:  ? Physical Exam ?Vitals reviewed.  ?HENT:  ?   Head: Normocephalic.  ?Cardiovascular:  ?   Rate and Rhythm: Normal rate.  ?   Pulses: Normal pulses.  ?Pulmonary:  ?   Effort: Pulmonary effort is normal.  ?Musculoskeletal:  ?   Right lower leg: Edema present.  ?   Left lower leg: Edema present.  ?Skin: ?   General: Skin is warm and dry.  ?Neurological:  ?   Mental Status: He is alert and oriented to person, place, and time.  ?Psychiatric:     ?   Mood and Affect: Mood normal.     ?   Behavior: Behavior normal.     ?   Thought Content: Thought content normal.     ?   Judgment: Judgment normal.  ? ? ?BP (!) 145/81 (BP Location: Left Arm)   Pulse (!) 54   Resp 17   Ht 6' 3.75" (1.924 m)   Wt 289 lb 14.4 oz (131.5 kg)   BMI 35.52 kg/m?  ? ?Past Medical History:  ?Diagnosis Date  ? 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 Atrial fibrillation ([place])   ? BPH (benign prostatic hyperplasia)   ? CHF (congestive heart failure) ([place])   ? Diabetes mellitus without complication ([place])   ? Discitis   ? Diverticulitis   ? Diverticulosis   ? DVT (deep venous thrombosis) ([place])   ? Erectile dysfunction   ? Hearing loss   ? Hyperlipidemia   ? Hypertension   ? Ischemic optic neuropathy   ? Lower GI bleeding   ? Non-ischemic cardiomyopathy ([place])   ? Sleep apnea   ? ? ?Social History  ? ?Socioeconomic History   ? Marital status: Married  ?  Spouse name: Not on file  ? Number of children: Not on file  ? Years of education: Not on file  ? Highest education level: Not on file  ?Occupational History  ? Not on file  ?Tobacco Use  ? Smoking status: Former  ?  Packs/day: 0.25  ?  Years: 29.00  ?  Pack years: 7.25  ?  Types: Cigarettes  ?  Quit date: 12/31/1993  ?  Years since quitting: 28.2  ? Smokeless tobacco: Never  ?Vaping Use  ? Vaping Use: Never used  ?Substance and Sexual Activity  ? Alcohol use: No  ?  Alcohol/week: 0.0 standard drinks  ? Drug use: No  ? Sexual activity: Not on file  ?Other Topics Concern  ? Not on file  ?Social History Narrative  ? Not on file  ? ?Social Determinants of Health  ? ?Financial Resource Strain: Not on file  ?Food Insecurity: Not on file  ?Transportation Needs: Not on file  ?Physical Activity: Not on file  ?Stress: Not on  file  ?Social Connections: Not on file  ?Intimate Partner Violence: Not on file  ? ? ?Past Surgical History:  ?Procedure Laterality Date  ? ATRIAL ABLATION SURGERY    ? CATARACT EXTRACTION W/PHACO Right 10/10/2020  ? Procedure: CATARACT EXTRACTION PHACO AND INTRAOCULAR LENS PLACEMENT (IOC) RIGHT ISTENT INJ DIABETIC 2.64  00:29.4;  Surgeon: Eulogio Bear, MD;  Location: [facility];  Service: Ophthalmology;  Laterality: Right;  Diabetic - oral meds  ? CATARACT EXTRACTION W/PHACO Left 03/06/2021  ? Procedure: CATARACT EXTRACTION PHACO AND INTRAOCULAR LENS PLACEMENT (IOC) LEFT DIABETIC ISTENT INJ 3.62 00:30.6;  Surgeon: Eulogio Bear, MD;  Location: [facility];  Service: Ophthalmology;  Laterality: Left;  Diabetic - oral meds  ? CHOLECYSTECTOMY    ? COLONOSCOPY Left 02/24/2018  ? Procedure: COLONOSCOPY;  Surgeon: Virgel Manifold, MD;  Location: [facility] ENDOSCOPY;  Service: Endoscopy;  Laterality: Left;  ? COLONOSCOPY WITH PROPOFOL N/A 09/25/2017  ? Procedure: COLONOSCOPY WITH PROPOFOL;  Surgeon: Manya Silvas, MD;  Location: [facility] ENDOSCOPY;  Service:  Endoscopy;  Laterality: N/A;  ? COLONOSCOPY WITH PROPOFOL N/A 02/18/2018  ? Procedure: COLONOSCOPY WITH PROPOFOL;  Surgeon: [name], MD;  Location: [facility] ENDOSCOPY;  Service: Endoscopy;  Laterality: N/A;  ? ESOPHAGOGASTRODUODENOSCOPY Left 02/23/2018  ? Procedure: ESOPHAGOGASTRODUODENOSCOPY (EGD);  Surgeon: Virgel Manifold, MD;  Location: [facility] ENDOSCOPY;  Service: Endoscopy;  Laterality: Left;  ? JOINT REPLACEMENT Right   ? knee  ? LAPAROTOMY  01/01/2017  ? Procedure: EXPLORATORY LAPAROTOMY;  Surgeon: Olean Ree, MD;  Location: [facility];  Service: General;;  ? PARTIAL COLECTOMY N/A 01/01/2017  ? Procedure: PARTIAL COLECTOMY;  Surgeon: Olean Ree, MD;  Location: [facility];  Service: General;  Laterality: N/A;  ? PERIPHERAL VASCULAR CATHETERIZATION N/A 12/28/2016  ? Procedure: Visceral Artery Intervention;  Surgeon: Algernon Huxley, MD;  Location: [place] [facility];  Service: Cardiovascular;  Laterality: N/A;  ? SECONDARY CLOSURE OF WOUND N/A 01/08/2017  ? Procedure: SECONDARY CLOSURE FOR EVISCERATION;  Surgeon: Florene Glen, MD;  Location: [facility];  Service: General;  Laterality: N/A;  ? SIGMOIDECTOMY    ? TOTAL KNEE ARTHROPLASTY Right   ? ? ?Family History  ?Problem Relation Age of Onset  ? Diabetes Sister   ? Diabetes Brother   ? Diabetes Sister   ? Diabetes Brother   ? ? ?Allergies  ?Allergen Reactions  ? Azithromycin Rash  ? Penicillins Rash  ?  Has patient had a PCN reaction causing immediate rash, facial/tongue/throat swelling, SOB or lightheadedness with hypotension: Yes ?Has patient had a PCN reaction causing severe rash involving mucus membranes or skin necrosis: Yes ?Has patient had a PCN reaction that required hospitalization No ?Has patient had a PCN reaction occurring within the last 10 years: No ?If all of the above answers are "NO", then may proceed with Cephalosporin use. ? ? ?  ? Lisinopril Cough  ? Losartan Cough  ? Metoprolol Other (See Comments)  ?  Bradycardia.  ? ? ? ?  Latest Ref Rng &  Units 01/12/2020  ?  9:49 AM 09/14/2019  ? 11:24 AM 06/07/2019  ?  1:26 PM  ?CBC  ?WBC 4.0 - 10.5 K/uL 7.9   7.7   10.3    ?Hemoglobin 13.0 - 17.0 g/dL 15.9   15.4   16.4    ?Hematocrit 39.0 - 52.0 % 52.1   48.1   50.5    ?Platelets 150 - 400 K/uL 293   283   291    ? ? ? ? ?  CMP  ?   ?Component Value Date/Time  ? NA 137 06/07/2019 1326  ? NA 136 03/26/2014 0418  ? K 4.3 06/07/2019 1326  ? K 4.0 03/26/2014 0418  ? CL 101 06/07/2019 1326  ? CL 102 03/26/2014 0418  ? CO2 28 06/07/2019 1326  ? CO2 34 (H) 03/26/2014 0418  ? GLUCOSE 141 (H) 06/07/2019 1326  ? GLUCOSE 114 (H) 03/26/2014 0418  ? BUN 9 06/07/2019 1326  ? BUN 8 03/26/2014 0418  ? CREATININE 0.72 06/07/2019 1326  ? CREATININE 0.90 03/26/2014 0418  ? CALCIUM 10.4 (H) 06/07/2019 1326  ? CALCIUM 9.2 03/26/2014 0418  ? PROT 5.9 (L) 02/22/2018 0510  ? PROT 6.3 (L) 10/04/2013 0533  ? ALBUMIN 3.4 (L) 02/22/2018 0510  ? ALBUMIN 3.0 (L) 10/04/2013 0533  ? AST 20 02/22/2018 0510  ? AST 8 (L) 10/04/2013 0533  ? ALT 18 02/22/2018 0510  ? ALT 17 10/04/2013 0533  ? ALKPHOS 64 02/22/2018 0510  ? ALKPHOS 92 10/04/2013 0533  ? BILITOT 0.8 02/22/2018 0510  ? BILITOT 1.7 (H) 10/04/2013 0533  ? GFRNONAA >60 06/07/2019 1326  ? GFRNONAA >60 03/26/2014 0418  ? GFRAA >60 06/07/2019 1326  ? GFRAA >60 03/26/2014 0418  ? ? ? ?No results found. ? ?   ?Assessment & Plan:  ? ?1. 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  We discussed the procedure in detail as well as the risk, benefits and alternatives.  Once the patient has been able to undergo his knee surgery approximately 6 weeks later we will have the patient follow-up with DVT studies ? ? ?Current Outpatient Medications on File Prior to Visit  ?Medication Sig Dispense Refill  ? acetaminophen (TYLENOL) 500 MG tablet Take 1,000 mg by mouth every 6 (six) hours as needed.     ? amLODipine (NORVASC) 5 MG tablet Take 5 mg by mouth daily.    ? aspirin EC 81 MG EC tablet Take 1 tablet (81 mg total) by mouth daily. 30 tablet 5  ? Cholecalciferol (VITAMIN D3) 2000 units capsule Tak

## 2022-04-12 ENCOUNTER — Encounter
Admission: RE | Admit: 2022-04-12 | Discharge: 2022-04-12 | Disposition: A | Discharge status: Home / Self Care | Payer: Medicare HMO | Source: Ambulatory Visit | Attending: Orthopedic Surgery | Admitting: Orthopedic Surgery

## 2022-04-12 DIAGNOSIS — Z01818 Encounter for other preprocedural examination: Secondary | ICD-10-CM | POA: Diagnosis present

## 2022-04-12 DIAGNOSIS — Z01810 Encounter for preprocedural cardiovascular examination: Secondary | ICD-10-CM | POA: Diagnosis not present

## 2022-04-12 DIAGNOSIS — Z01812 Encounter for preprocedural laboratory examination: Secondary | ICD-10-CM

## 2022-04-12 HISTORY — DX: Benign neoplasm of colon, unspecified: D12.6

## 2022-04-12 HISTORY — DX: Other intervertebral disc degeneration, lumbar region: M51.36

## 2022-04-12 HISTORY — DX: Other intervertebral disc degeneration, lumbar region without mention of lumbar back pain or lower extremity pain: M51.369

## 2022-04-12 HISTORY — DX: Chronic obstructive pulmonary disease, unspecified: J44.9

## 2022-04-12 LAB — COMPREHENSIVE METABOLIC PANEL
ALT: 18 U/L (ref 0–44)
AST: 16 U/L (ref 15–41)
Albumin: 4.1 g/dL (ref 3.5–5.0)
Alkaline Phosphatase: 78 U/L (ref 38–126)
Anion gap: 5 (ref 5–15)
BUN: 12 mg/dL (ref 8–23)
CO2: 30 mmol/L (ref 22–32)
Calcium: 9.2 mg/dL (ref 8.9–10.3)
Chloride: 101 mmol/L (ref 98–111)
Creatinine, Ser: 0.79 mg/dL (ref 0.61–1.24)
GFR, Estimated: >60 mL/min (ref >60)
Glucose, Bld: 128 mg/dL — ABNORMAL HIGH (ref 70–99)
Potassium: 4.3 mmol/L (ref 3.5–5.1)
Sodium: 136 mmol/L (ref 135–145)
Total Bilirubin: 1.4 mg/dL — ABNORMAL HIGH (ref 0.3–1.2)
Total Protein: 7.6 g/dL (ref 6.5–8.1)

## 2022-04-12 LAB — CBC WITH DIFFERENTIAL/PLATELET
Abs Immature Granulocytes: 0.03 10*3/uL (ref 0.00–0.07)
Basophils Absolute: 0.1 10*3/uL (ref 0.0–0.1)
Basophils Relative: 1 %
Eosinophils Absolute: 0.1 10*3/uL (ref 0.0–0.5)
Eosinophils Relative: 1 %
HCT: 49.6 % (ref 39.0–52.0)
Hemoglobin: 15.8 g/dL (ref 13.0–17.0)
Immature Granulocytes: 0 %
Lymphocytes Relative: 31 %
Lymphs Abs: 2.2 10*3/uL (ref 0.7–4.0)
MCH: 27.1 pg (ref 26.0–34.0)
MCHC: 31.9 g/dL (ref 30.0–36.0)
MCV: 85.1 fL (ref 80.0–100.0)
Monocytes Absolute: 0.8 10*3/uL (ref 0.1–1.0)
Monocytes Relative: 11 %
Neutro Abs: 4 10*3/uL (ref 1.7–7.7)
Neutrophils Relative %: 56 %
Platelets: 302 10*3/uL (ref 150–400)
RBC: 5.83 MIL/uL — ABNORMAL HIGH (ref 4.22–5.81)
RDW: 14.1 % (ref 11.5–15.5)
WBC: 7.2 10*3/uL (ref 4.0–10.5)
nRBC: 0 % (ref 0.0–0.2)

## 2022-04-12 LAB — URINALYSIS, ROUTINE W REFLEX MICROSCOPIC
Bacteria, UA: NONE SEEN
Bilirubin Urine: NEGATIVE
Glucose, UA: >=500 mg/dL — AB
Hgb urine dipstick: NEGATIVE
Ketones, ur: NEGATIVE mg/dL
Leukocytes,Ua: NEGATIVE
Nitrite: NEGATIVE
Protein, ur: NEGATIVE mg/dL
Specific Gravity, Urine: 1.016 (ref 1.005–1.030)
Squamous Epithelial / HPF: NONE SEEN (ref 0–5)
pH: 5 (ref 5.0–8.0)

## 2022-04-12 LAB — TYPE AND SCREEN
ABO/RH(D): B POS
Antibody Screen: NEGATIVE

## 2022-04-12 LAB — SURGICAL PCR SCREEN
MRSA, PCR: NEGATIVE
Staphylococcus aureus: NEGATIVE

## 2022-04-12 NOTE — Patient Instructions (Addendum)
Your procedure is scheduled on: Tuesday, April 25 ?Report to the Registration Desk on the 1st floor of the [facility]. ?To find out your arrival time, please call [phone] between 1PM - 3PM on: Monday, April 24 ? ?REMEMBER: ?Instructions that are not followed completely may result in serious medical risk, up to and including death; or upon the discretion of your surgeon and anesthesiologist your surgery may need to be rescheduled. ? ?Do not eat food after midnight the night before surgery.  ?No gum chewing, lozengers or hard candies. ? ?You may however, drink water up to 2 hours before you are scheduled to arrive for your surgery. Do not drink anything within 2 hours of your scheduled arrival time. ? ?In addition, your doctor has ordered for you to drink the provided  ?Gatorade G2 ?Drinking this carbohydrate drink up to two hours before surgery helps to reduce insulin resistance and improve patient outcomes. Please complete drinking 2 hours prior to scheduled arrival time. ? ?TAKE THESE MEDICATIONS THE MORNING OF SURGERY WITH A SIP OF WATER: ? ?Amlodipine ? ?Stop metformin 2 days prior to surgery. Last day to take Metformin is Saturday, April 22. Resume AFTER surgery. ? ?One week prior to surgery: starting April 18 ?Stop Anti-inflammatories (NSAIDS) such as Advil, Aleve, Ibuprofen, Motrin, Naproxen, Naprosyn and Aspirin based products such as Excedrin, Goodys Powder, BC Powder. ?Stop ANY OVER THE COUNTER supplements until after surgery. Stop Vitamin D, multiple vitamins. ?You may however, continue to take Tylenol if needed for pain up until the day of surgery. ? ?No Alcohol for 24 hours before or after surgery. ? ?No Smoking including e-cigarettes for 24 hours prior to surgery.  ?No chewable tobacco products for at least 6 hours prior to surgery.  ?No nicotine patches on the day of surgery. ? ?Do not use any "recreational" drugs for at least a week prior to your surgery.  ?Please be advised that the  combination of cocaine and anesthesia may have negative outcomes, up to and including death. ?If you test positive for cocaine, your surgery will be cancelled. ? ?On the morning of surgery brush your teeth with toothpaste and water, you may rinse your mouth with mouthwash if you wish. ?Do not swallow any toothpaste or mouthwash. ? ?Use CHG Soap as directed on instruction sheet. ? ?Do not wear jewelry. ? ?Do not wear lotions, powders, or perfumes.  ? ?Do not shave body from the neck down 48 hours prior to surgery just in case you cut yourself which could leave a site for infection.  ?Also, freshly shaved skin may become irritated if using the CHG soap. ? ?Contact lenses, hearing aids and dentures may not be worn into surgery. ? ?Do not bring valuables to the hospital. [facility] is not responsible for any missing/lost belongings or valuables.  ? ?Bring your C-PAP to the hospital with you in case you may have to spend the night.  ? ?Notify your doctor if there is any change in your medical condition (cold, fever, infection). ? ?Wear comfortable clothing (specific to your surgery type) to the hospital. ? ?After surgery, you can help prevent lung complications by doing breathing exercises.  ?Take deep breaths and cough every 1-2 hours. Your doctor may order a device called an Incentive Spirometer to help you take deep breaths. ? ?If you are being admitted to the hospital overnight, leave your suitcase in the car. ?After surgery it may be brought to your room. ? ?If you are being discharged the  day of surgery, you will not be allowed to drive home. ?You will need a responsible adult (18 years or older) to drive you home and stay with you that night.  ? ?If you are taking public transportation, you will need to have a responsible adult (18 years or older) with you. ?Please confirm with your physician that it is acceptable to use public transportation.  ? ?Please call the [facility] Dept. at [phone] if  you have any questions about these instructions. ? ?Surgery Visitation Policy: ? ?Patients undergoing a surgery or procedure may have two family members or support persons with them as long as the person is not COVID-19 positive or experiencing its symptoms.  ? ?Inpatient Visitation:   ? ?Visiting hours are 7 a.m. to 8 p.m. ?Up to four visitors are allowed at one time in a patient room, including children. The visitors may rotate out with other people during the day. One designated support person (adult) may remain overnight.  ?

## 2022-04-12 NOTE — Pre-Procedure Instructions (Signed)
Progress Notes ?- documented in this encounter ?Erby Pian, MD - 10/13/2021 8:30 AM EDT ?Formatting of this note is different from the original. ? ?Follow-up ? ?History of Present Illness: ?Evan [name] is a 77 y.o. male presents to clinic for recheck. Hx of mild-moderate obstruction, dyspnea no worse, cough at times, on cpap most nights. Encouraged to wear it every night. No fever, hemoptysis, chest pain, edema or calf pain. cpap machine is functioning well, rested,  ? ?Current Medications:  ?Current Outpatient Medications  ?Medication Sig Dispense Refill  ? acetaminophen (TYLENOL) 500 MG tablet Take 1,000 mg by mouth every 8 (eight) hours as needed for Pain  ? amLODIPine (NORVASC) 10 MG tablet Take 1 tablet (10 mg total) by mouth once daily For blood pressure 90 tablet 3  ? aspirin 81 mg tablet 1 tab by mouth daily  ? blood glucose diagnostic (CONTOUR TEST STRIPS) test strip once daily as instructed. 100 strip 3  ? blood glucose meter kit once daily as directed 1 each 0  ? cholecalciferol (VITAMIN D3) 2,000 unit capsule Take 2,000 Units by mouth once daily.  ? cyanocobalamin (VITAMIN B12) 1000 MCG tablet Take 1,000 mcg by mouth once daily  ? hydrOXYzine (ATARAX) 25 MG tablet Take 1 tablet (25 mg total) by mouth nightly as needed 90 tablet 1  ? metFORMIN (GLUCOPHAGE-XR) 500 MG XR tablet Take 2 tablets (1,000 mg total) by mouth daily with breakfast For diabetes 180 tablet 3  ? multivitamin-lutein (CENTRUM SILVER) tablet Take 1 tablet by mouth once daily.  ? polyethylene glycol (MIRALAX) powder Take 17-34 g by mouth once daily Mix in 4-8ounces of fluid prior to taking.  ? pravastatin (PRAVACHOL) 40 MG tablet Take 1 tablet (40 mg total) by mouth once daily For cholesterol 90 tablet 3  ? hydrocodone-chlorpheniramine (TUSSIONEX) 10-8 mg/5 mL ER suspension Take 5 mLs by mouth every 12 (twelve) hours as needed for Cough (Patient not taking: Reported on 10/13/2021) 115 mL 0  ? ?No current facility-administered  medications for this visit.  ? ?Problem List:  ?Patient Active Problem List  ?Diagnosis  ? Status post total right knee replacement  ? Degenerative joint disease of knee, left  ? Arthrosis of knee  ? Hypertension associated with type 2 diabetes mellitus (CMS-HCC)  ? Lumbar degenerative disc disease  ? 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 Hypercholesteremia  ? Hearing loss  ? High risk medication use  ? Sleep apnea  ? History of adenomatous polyp of colon  ? Stage 2 moderate COPD by GOLD classification , unspecified (CMS-HCC)  ? Ischemic optic neuropathy of left eye  ? History of deep vein thrombosis (DVT) of lower extremity  ? Aortic atherosclerosis (CMS-HCC)  ? Iron deficiency anemia due to chronic blood loss  ? Chronic insomnia  ? Severe obesity (BMI 35.0-39.9) with comorbidity (CMS-HCC)  ? ?History: ?Past Medical History:  ?Diagnosis Date  ? Acute lower GI bleeding 01/01/2017  ?failed embolization, so underwent sigmoidectomy  ? Anemia 11/07/2014  ? Atrial fibrillation and flutter 11/07/2014  ?S/P ablation 08/2009  ? BPH (benign prostatic hyperplasia)  ? Congestive heart failure (CMS-HCC) 11/08/2015  ? 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 COPD (chronic obstructive pulmonary disease) (CMS-HCC)  ? Diverticulosis  ? ED (erectile dysfunction)  ? Hearing loss 11/07/2014  ? History of adenomatous polyp of colon 01/31/2007  ?due for colonoscopy 08/23/2017  ?  History of discitis  ?lumbar diskitis 05/2010  ? History of GI diverticular bleed 2013  ?hospitalized for diverticular bleed requiring embolization  ? Hypercholesteremia 11/07/2014  ? Hypertension 11/07/2014  ? 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 Lumbar degenerative disc disease 11/07/2014  ? Non-ischemic cardiomyopathy (CMS-HCC) 11/07/2014  ?Dr  Mylinda Latina- [facility] ( seen on Fridays at [facility])  ? Obesity  ? 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 Sleep apnea 05/04/2015  ?on auto CPAP per Dr. Raul Del  ? ?Past Surgical History:  ?Procedure Laterality Date  ? ABLATION BY RADIOFREQUENCY AV NODE FUNCTION 2010  ?for atrial fib/flutter  ? COLONOSCOPY 01/31/2007  ?Adenomatous Polyps  ? COLONOSCOPY 08/23/2012, 02/10/2009  ?PH Adenomatous Polyps: CBF 07/2017  ? COLONOSCOPY 09/25/2017  ?PH Adenomatous Polyps: CBF 08/2022  ? EGD 10/15/2013  ?Gastritis: No repeat per [facility]  ? Embolization  ?for diverticular bleed  ? ERCP 05/10/2010  ? FLEXIBLE SIGMOIDOSCOPY 05/27/2007  ?Adenomatous Polyp  ? LAPAROSCOPIC CHOLECYSTECTOMY 04/2010  ? REPLACEMENT TOTAL KNEE Right 03/24/2014  ? SIGMOID RESECTION / RECTOPEXY N/A 01/01/2017  ?for lower GI bleed with continued bleeding despite embolization  ? ?Family History  ?Problem Relation Age of Onset  ? Ovarian cancer Sister  ? Diabetes type II Sister  ? Heart disease Sister  ? Coronary Artery Disease (Blocked arteries around heart) Father  ? Myocardial Infarction (Heart attack) Brother  ?MI  ? Diabetes Brother  ? Myocardial Infarction (Heart attack) Sister  ? Sudden death (unexpected death due to unknown cause) Sister  ? Ovarian cancer Sister  ? Glaucoma Neg Hx  ? Macular degeneration Neg Hx  ? ?Social History  ? ?Socioeconomic History  ? Marital status: Married  ?Spouse name: Elvis Coil  ? Number of children: 1  ? Years of education: 40  ?Occupational History  ? Occupation: Retired  ?Tobacco Use  ? Smoking status: Former Smoker  ?Packs/day: 1.00  ?Years: 20.00  ?Pack years: 20.00  ?Types: Cigarettes  ?Quit date: 12/31/1993  ?Years since quitting: 27.8  ? Smokeless tobacco: Never Used  ?Vaping Use  ? Vaping Use: Never used  ?Substance and Sexual Activity  ? Alcohol use: No  ?Alcohol/week: 0.0 standard drinks  ?Comment: socially  ? Drug use: No  ? Sexual activity: Yes  ?Partners: Female  ? ?Allergies:  ?Penicillins, Zithromax  [azithromycin], Lisinopril, Losartan, and Metoprolol succinate ? ?Review of Systems: ?As per above. Pretty much unchanged with the exception that his breathing is improved. No associated cardiopulmonary, GI, GU, dermatological symptoms today. No focal neurological symptoms or psychological changes.  ? ?Physical Exam: ?BP (!) 144/85  Pulse 99  Ht 190.5 cm (6' 3" )  Wt (!) 133.8 kg (295 lb)  SpO2 94%  BMI 36.87 kg/m? (!) 133.8 kg (295 lb) ?94% ?General: NAD. Able to speak in complete sentences without cough or dyspnea ?HEENT: Normocephalic, nontraumatic. Extraocular movements intact ?NECK: Supple. No JVD, nodes, thryomegaly ?CV: RRR no murmurs, gallops, rubs ?PULM: Normal respiratory effort, Clear to auscultation bilaterally without wheezing or crackles ?ABD: Increase abdominal girth ? ?EXTREMITIES: No significant edema, cyanosis or Homans'signs ?SKIN: Fair turgor. No rashes ?LYMPHATIC: No nodes ?NEURO: No gross deficits ?PSYCH: Appropriate affect, alert, oriented  ? ?Impression: ?Mild - moderate obstruction, ex smoker,  chronic, occasional cough, no pnd, no gerd.  Dyspnea is no worse ?Prn albuterol ?F/u in 6 months  ?   ?Obstructive sleep apnea, on auto cpap,  insomnia. on  hydroxyzine  ?Continue on same settings (auto 4-15) ?Weight loss ?  ?Restriction on spirometry dur to his weight most likely ?Weight loss  ?  ?  ?  ? ? ?Electronically signed by Erby Pian, MD at 10/13/2021 9:01 AM EDT  ?Plan of Treatment ?- documented as of this encounter ?Plan of Treatment - Upcoming Encounters ?Upcoming Encounters ?Date Type Specialty Care Team Description  ?04/16/2022 Office Visit Orthopaedics Lauris Poag, MD   ?[place]   ?[place]   ?[place], [place] [address]   ?[phone] (Work)   ?[phone] (Fax)   ?  ?[facility] [place] [place]   ?[place], [place] [address]   ?[phone] (Work)   ?[phone] (Fax)      ?05/08/2022 Office Visit Pulmonary Erby Pian, MD   ?[place]   ?[place]   ?[place], [place] [address]   ?[phone] (Work)   ?[phone] (Fax)      ?05/09/2022 Post Op Orthopaedics Rudene Christians, Unice Cobble, MD   ?

## 2022-04-17 ENCOUNTER — Encounter: Payer: Self-pay | Admitting: Vascular Surgery

## 2022-04-17 ENCOUNTER — Ambulatory Visit
Admission: RE | Admit: 2022-04-17 | Discharge: 2022-04-17 | Disposition: A | Discharge status: Home / Self Care | Payer: Medicare HMO | Attending: Vascular Surgery | Admitting: Vascular Surgery

## 2022-04-17 ENCOUNTER — Encounter
Admission: RE | Disposition: A | Discharge status: Home / Self Care | Payer: Self-pay | Source: Home / Self Care | Attending: Vascular Surgery

## 2022-04-17 ENCOUNTER — Other Ambulatory Visit: Payer: Self-pay

## 2022-04-17 DIAGNOSIS — Z86718 Personal history of other venous thrombosis and embolism: Secondary | ICD-10-CM | POA: Diagnosis not present

## 2022-04-17 DIAGNOSIS — I82543 Chronic embolism and thrombosis of tibial vein, bilateral: Secondary | ICD-10-CM | POA: Diagnosis present

## 2022-04-17 DIAGNOSIS — M169 Osteoarthritis of hip, unspecified: Secondary | ICD-10-CM

## 2022-04-17 DIAGNOSIS — Z7984 Long term (current) use of oral hypoglycemic drugs: Secondary | ICD-10-CM | POA: Insufficient documentation

## 2022-04-17 DIAGNOSIS — E119 Type 2 diabetes mellitus without complications: Secondary | ICD-10-CM | POA: Insufficient documentation

## 2022-04-17 DIAGNOSIS — I82409 Acute embolism and thrombosis of unspecified deep veins of unspecified lower extremity: Secondary | ICD-10-CM

## 2022-04-17 HISTORY — PX: IVC FILTER INSERTION: CATH118245

## 2022-04-17 LAB — GLUCOSE, CAPILLARY
Glucose-Capillary: 172 mg/dL — ABNORMAL HIGH (ref 70–99)
Glucose-Capillary: 114 mg/dL — ABNORMAL HIGH (ref 70–99)

## 2022-04-17 SURGERY — IVC FILTER INSERTION
Anesthesia: Moderate Sedation

## 2022-04-17 MED ORDER — FENTANYL CITRATE PF 50 MCG/ML IJ SOSY
PREFILLED_SYRINGE | INTRAMUSCULAR | Status: AC
  Filled 2022-04-17: qty 1

## 2022-04-17 MED ORDER — MIDAZOLAM HCL 2 MG/ML PO SYRP: 8.0000 mg | ORAL_SOLUTION | Freq: Once | ORAL | Status: DC | PRN

## 2022-04-17 MED ORDER — METHYLPREDNISOLONE SODIUM SUCC 125 MG IJ SOLR: 125.0000 mg | Freq: Once | INTRAMUSCULAR | Status: DC | PRN

## 2022-04-17 MED ORDER — VANCOMYCIN HCL 1500 MG/300ML IV SOLN
1500.0000 mg | INTRAVENOUS | Status: AC
  Administered 2022-04-17: 1500 mg via INTRAVENOUS
  Filled 2022-04-17: qty 300

## 2022-04-17 MED ORDER — MIDAZOLAM HCL 2 MG/2ML IJ SOLN
INTRAMUSCULAR | Status: DC | PRN
  Administered 2022-04-17: 2 mg via INTRAVENOUS
  Administered 2022-04-17: 1 mg via INTRAVENOUS

## 2022-04-17 MED ORDER — MIDAZOLAM HCL 2 MG/2ML IJ SOLN
INTRAMUSCULAR | Status: AC
  Filled 2022-04-17: qty 2

## 2022-04-17 MED ORDER — DIPHENHYDRAMINE HCL 50 MG/ML IJ SOLN
50.0000 mg | Freq: Once | INTRAMUSCULAR | Status: DC | PRN
Start: 2022-04-17 — End: 2022-04-17

## 2022-04-17 MED ORDER — FENTANYL CITRATE (PF) 100 MCG/2ML IJ SOLN
INTRAMUSCULAR | Status: DC | PRN
  Administered 2022-04-17: 50 ug via INTRAVENOUS
  Administered 2022-04-17: 25 ug via INTRAVENOUS

## 2022-04-17 MED ORDER — HYDROMORPHONE HCL 1 MG/ML IJ SOLN: 1.0000 mg | Freq: Once | INTRAMUSCULAR | Status: DC | PRN

## 2022-04-17 MED ORDER — FAMOTIDINE 20 MG PO TABS: 40.0000 mg | ORAL_TABLET | Freq: Once | ORAL | Status: DC | PRN

## 2022-04-17 MED ORDER — SODIUM CHLORIDE 0.9 % IV SOLN: INTRAVENOUS | Status: DC

## 2022-04-17 MED ORDER — ONDANSETRON HCL 4 MG/2ML IJ SOLN: 4.0000 mg | Freq: Four times a day (QID) | INTRAMUSCULAR | Status: DC | PRN

## 2022-04-17 MED ORDER — IODIXANOL 320 MG/ML IV SOLN
INTRAVENOUS | Status: DC | PRN
  Administered 2022-04-17: 10 mL via INTRA_ARTERIAL

## 2022-04-17 SURGICAL SUPPLY — 4 items
COVER PROBE U/S 5X48 (MISCELLANEOUS) ×1 IMPLANT
KIT FEM OPTION ELITE FILTER (Filter) ×1 IMPLANT
PACK ANGIOGRAPHY (CUSTOM PROCEDURE TRAY) ×2 IMPLANT
WIRE GUIDERIGHT .035X150 (WIRE) ×1 IMPLANT

## 2022-04-17 NOTE — Op Note (Signed)
[facility] ? ? ?OPERATIVE NOTE ? ? ? ?PRE-OPERATIVE DIAGNOSIS: History of DVT;  DJD hip requiring replacement ? ?POST-OPERATIVE DIAGNOSIS: Same ? ?PROCEDURE: ?1.   Ultrasound guidance for vascular access to the right vein ?2.   Catheter placement into the inferior vena cava ?3.   Inferior venacavogram ?4.   Placement of a OptionElite IVC filter ? ?SURGEON: Hortencia Pilar ? ?ASSISTANT(S): None ? ?ANESTHESIA: Conscious sedation was administered by the interventional radiology RN under my direct supervision. IV Versed plus fentanyl were utilized. Continuous ECG, pulse oximetry and blood pressure was monitored throughout the entire procedure. Conscious sedation was for a total of 17 minutes. ? ?ESTIMATED BLOOD LOSS: minimal ? ?FINDING(S): ?1.  Patent IVC ? ?SPECIMEN(S):  none ? ?INDICATIONS:   ?Evan [name] is a 77 y.o. y.o. male who presents with history of DVT;  DJD hip requiring replacement.  Inferior vena cava filter is indicated for this reason.  Risks and benefits including filter thrombosis, migration, fracture, bleeding, and infection were all discussed.  We discussed that all IVC filters that we place can be removed if desired from the patient once the need for the filter has passed.   ? ?DESCRIPTION: ?After obtaining full informed written consent, the patient was brought back to the vascular suite. The skin was sterilely prepped and draped in a sterile surgical field was created. Ultrasound was placed in a sterile sleeve. The right common femoral vein was echolucent and compressible indicating patency. Image was recorded for the permanent record. The puncture was made under continuous real-time ultrasound guidance.  The right femoral vein was accessed under direct ultrasound guidance without difficulty with a micropuncture needle. Microwire was then advanced under fluoroscopic guidance without difficulty. Micro-sheath was then inserted and a J-wire was then placed. The dilator is  passed over the wire and the delivery sheath was placed into the inferior vena cava.  Inferior venacavogram was performed. This demonstrated a patent IVC with the level of the renal veins at L2.  The filter was then deployed into the inferior vena cava at the level of L3 just below the renal veins. The delivery sheath was then removed. Pressure was held. Sterile dressings were placed. The patient tolerated the procedure well and was taken to the recovery room in stable condition. ? ?Interpretation: ?IVC widely patent IVC filter deployed in good orientation ? ?COMPLICATIONS: None ? ?CONDITION: Stable ? ?Hortencia Pilar ? ?04/17/2022, 12:37 PM ?  ?

## 2022-04-17 NOTE — Interval H&P Note (Signed)
History and Physical Interval Note: ? ?04/17/2022 ?12:05 PM ? ?Merville Hijazi  has presented today for surgery, with the diagnosis of IVC Filter Insertion   DVT.  The various methods of treatment have been discussed with the patient and family. After consideration of risks, benefits and other options for treatment, the patient has consented to  Procedure(s): ?IVC FILTER INSERTION (N/A) as a surgical intervention.  The patient's history has been reviewed, patient examined, no change in status, stable for surgery.  I have reviewed the patient's chart and labs.  Questions were answered to the patient's satisfaction.   ? ? ?Hortencia Pilar ? ? ?

## 2022-04-18 ENCOUNTER — Encounter: Payer: Self-pay | Admitting: Vascular Surgery

## 2022-04-23 MED ORDER — FAMOTIDINE 20 MG PO TABS: 20.0000 mg | ORAL_TABLET | Freq: Once | ORAL | Status: AC

## 2022-04-24 ENCOUNTER — Ambulatory Visit: Payer: Medicare HMO | Admitting: Certified Registered Nurse Anesthetist

## 2022-04-24 ENCOUNTER — Other Ambulatory Visit: Payer: Self-pay

## 2022-04-24 ENCOUNTER — Ambulatory Visit: Payer: Medicare HMO

## 2022-04-24 ENCOUNTER — Observation Stay
Admission: RE | Admit: 2022-04-24 | Discharge: 2022-04-25 | Disposition: A | Discharge status: Home Health | Payer: Medicare HMO | Source: Ambulatory Visit | Attending: Orthopedic Surgery | Admitting: Orthopedic Surgery

## 2022-04-24 ENCOUNTER — Encounter
Admission: RE | Disposition: A | Discharge status: Home Health | Payer: Self-pay | Source: Ambulatory Visit | Attending: Orthopedic Surgery

## 2022-04-24 ENCOUNTER — Observation Stay: Payer: Medicare HMO

## 2022-04-24 ENCOUNTER — Encounter: Payer: Self-pay | Admitting: Orthopedic Surgery

## 2022-04-24 DIAGNOSIS — Z7982 Long term (current) use of aspirin: Secondary | ICD-10-CM | POA: Insufficient documentation

## 2022-04-24 DIAGNOSIS — J449 Chronic obstructive pulmonary disease, unspecified: Secondary | ICD-10-CM | POA: Diagnosis not present

## 2022-04-24 DIAGNOSIS — Z86718 Personal history of other venous thrombosis and embolism: Secondary | ICD-10-CM | POA: Diagnosis not present

## 2022-04-24 DIAGNOSIS — Z7984 Long term (current) use of oral hypoglycemic drugs: Secondary | ICD-10-CM | POA: Insufficient documentation

## 2022-04-24 DIAGNOSIS — Z96651 Presence of right artificial knee joint: Secondary | ICD-10-CM | POA: Diagnosis not present

## 2022-04-24 DIAGNOSIS — I110 Hypertensive heart disease with heart failure: Secondary | ICD-10-CM | POA: Diagnosis not present

## 2022-04-24 DIAGNOSIS — Z79899 Other long term (current) drug therapy: Secondary | ICD-10-CM | POA: Diagnosis not present

## 2022-04-24 DIAGNOSIS — M1611 Unilateral primary osteoarthritis, right hip: Secondary | ICD-10-CM | POA: Diagnosis present

## 2022-04-24 DIAGNOSIS — Z96641 Presence of right artificial hip joint: Secondary | ICD-10-CM

## 2022-04-24 DIAGNOSIS — E119 Type 2 diabetes mellitus without complications: Secondary | ICD-10-CM | POA: Diagnosis not present

## 2022-04-24 DIAGNOSIS — I509 Heart failure, unspecified: Secondary | ICD-10-CM | POA: Insufficient documentation

## 2022-04-24 DIAGNOSIS — Z01812 Encounter for preprocedural laboratory examination: Secondary | ICD-10-CM

## 2022-04-24 HISTORY — PX: TOTAL HIP ARTHROPLASTY: SHX124

## 2022-04-24 LAB — GLUCOSE, CAPILLARY
Glucose-Capillary: 180 mg/dL — ABNORMAL HIGH (ref 70–99)
Glucose-Capillary: 157 mg/dL — ABNORMAL HIGH (ref 70–99)
Glucose-Capillary: 140 mg/dL — ABNORMAL HIGH (ref 70–99)
Glucose-Capillary: 167 mg/dL — ABNORMAL HIGH (ref 70–99)
Glucose-Capillary: 152 mg/dL — ABNORMAL HIGH (ref 70–99)

## 2022-04-24 LAB — CBC
HCT: 48.1 % (ref 39.0–52.0)
Hemoglobin: 15.5 g/dL (ref 13.0–17.0)
MCH: 27.5 pg (ref 26.0–34.0)
MCHC: 32.2 g/dL (ref 30.0–36.0)
MCV: 85.3 fL (ref 80.0–100.0)
Platelets: 264 10*3/uL (ref 150–400)
RBC: 5.64 MIL/uL (ref 4.22–5.81)
RDW: 14.2 % (ref 11.5–15.5)
WBC: 9 10*3/uL (ref 4.0–10.5)
nRBC: 0 % (ref 0.0–0.2)

## 2022-04-24 LAB — CREATININE, SERUM
Creatinine, Ser: 0.88 mg/dL (ref 0.61–1.24)
GFR, Estimated: >60 mL/min (ref >60)

## 2022-04-24 SURGERY — ARTHROPLASTY, HIP, TOTAL, ANTERIOR APPROACH
Anesthesia: General | Site: Hip | Laterality: Right

## 2022-04-24 MED ORDER — FENTANYL CITRATE (PF) 100 MCG/2ML IJ SOLN
INTRAMUSCULAR | Status: DC | PRN
  Administered 2022-04-24: 25 ug via INTRAVENOUS
  Administered 2022-04-24: 50 ug via INTRAVENOUS
  Administered 2022-04-24: 25 ug via INTRAVENOUS

## 2022-04-24 MED ORDER — ONDANSETRON HCL 4 MG PO TABS: 4.0000 mg | ORAL_TABLET | Freq: Four times a day (QID) | ORAL | Status: DC | PRN

## 2022-04-24 MED ORDER — ONDANSETRON HCL 4 MG/2ML IJ SOLN: 4.0000 mg | Freq: Once | INTRAMUSCULAR | Status: DC | PRN

## 2022-04-24 MED ORDER — 0.9 % SODIUM CHLORIDE (POUR BTL) OPTIME
TOPICAL | Status: DC | PRN
  Administered 2022-04-24: 1000 mL

## 2022-04-24 MED ORDER — PRAVASTATIN SODIUM 20 MG PO TABS
40.0000 mg | ORAL_TABLET | Freq: Every day | ORAL | Status: DC
  Administered 2022-04-24: 40 mg via ORAL
  Filled 2022-04-24: qty 2

## 2022-04-24 MED ORDER — GLYCOPYRROLATE 0.2 MG/ML IJ SOLN
INTRAMUSCULAR | Status: AC
  Filled 2022-04-24: qty 1

## 2022-04-24 MED ORDER — MORPHINE SULFATE (PF) 2 MG/ML IV SOLN: 0.5000 mg | INTRAVENOUS | Status: DC | PRN

## 2022-04-24 MED ORDER — CHLORHEXIDINE GLUCONATE 0.12 % MT SOLN
OROMUCOSAL | Status: AC
  Administered 2022-04-24: 15 mL via OROMUCOSAL
  Filled 2022-04-24: qty 15

## 2022-04-24 MED ORDER — PHENYLEPHRINE HCL (PRESSORS) 10 MG/ML IV SOLN
INTRAVENOUS | Status: DC | PRN
  Administered 2022-04-24: 80 ug via INTRAVENOUS
  Administered 2022-04-24 (×2): 160 ug via INTRAVENOUS
  Administered 2022-04-24 (×2): 80 ug via INTRAVENOUS

## 2022-04-24 MED ORDER — ALUM & MAG HYDROXIDE-SIMETH 200-200-20 MG/5ML PO SUSP: 30.0000 mL | ORAL | Status: DC | PRN

## 2022-04-24 MED ORDER — SURGIPHOR WOUND IRRIGATION SYSTEM - OPTIME: TOPICAL | Status: DC | PRN

## 2022-04-24 MED ORDER — METHOCARBAMOL 500 MG PO TABS: 500.0000 mg | ORAL_TABLET | Freq: Four times a day (QID) | ORAL | Status: DC | PRN

## 2022-04-24 MED ORDER — CEFAZOLIN SODIUM-DEXTROSE 2-4 GM/100ML-% IV SOLN
2.0000 g | Freq: Four times a day (QID) | INTRAVENOUS | Status: AC
  Administered 2022-04-24 (×2): 2 g via INTRAVENOUS
  Filled 2022-04-24 (×2): qty 100

## 2022-04-24 MED ORDER — HYDROCODONE-ACETAMINOPHEN 5-325 MG PO TABS
1.0000 | ORAL_TABLET | ORAL | Status: DC | PRN
  Administered 2022-04-24 (×2): 1 via ORAL
  Filled 2022-04-24 (×2): qty 1

## 2022-04-24 MED ORDER — HYDROXYZINE HCL 25 MG PO TABS
25.0000 mg | ORAL_TABLET | Freq: Every evening | ORAL | Status: DC | PRN
  Filled 2022-04-24: qty 1

## 2022-04-24 MED ORDER — ADULT MULTIVITAMIN W/MINERALS CH
1.0000 | ORAL_TABLET | Freq: Every morning | ORAL | Status: DC
  Administered 2022-04-24 – 2022-04-25 (×2): 1 via ORAL
  Filled 2022-04-24 (×2): qty 1

## 2022-04-24 MED ORDER — SODIUM CHLORIDE FLUSH 0.9 % IV SOLN
INTRAVENOUS | Status: AC
  Filled 2022-04-24: qty 80

## 2022-04-24 MED ORDER — PANTOPRAZOLE SODIUM 40 MG PO TBEC
40.0000 mg | DELAYED_RELEASE_TABLET | Freq: Every day | ORAL | Status: DC
  Administered 2022-04-24 – 2022-04-25 (×2): 40 mg via ORAL
  Filled 2022-04-24 (×2): qty 1

## 2022-04-24 MED ORDER — ACETAMINOPHEN 325 MG PO TABS: 325.0000 mg | ORAL_TABLET | Freq: Four times a day (QID) | ORAL | Status: DC | PRN

## 2022-04-24 MED ORDER — PROPOFOL 500 MG/50ML IV EMUL
INTRAVENOUS | Status: DC | PRN
  Administered 2022-04-24: 50 ug/kg/min via INTRAVENOUS

## 2022-04-24 MED ORDER — PROPOFOL 1000 MG/100ML IV EMUL
INTRAVENOUS | Status: AC
  Filled 2022-04-24: qty 100

## 2022-04-24 MED ORDER — INSULIN ASPART 100 UNIT/ML IJ SOLN
0.0000 [IU] | Freq: Three times a day (TID) | INTRAMUSCULAR | Status: DC
  Administered 2022-04-24: 3 [IU] via SUBCUTANEOUS
  Administered 2022-04-25: 2 [IU] via SUBCUTANEOUS
  Administered 2022-04-25: 3 [IU] via SUBCUTANEOUS
  Filled 2022-04-24 (×3): qty 1

## 2022-04-24 MED ORDER — SODIUM CHLORIDE (PF) 0.9 % IJ SOLN
INTRAMUSCULAR | Status: DC | PRN
  Administered 2022-04-24: 90 mL via INTRAMUSCULAR

## 2022-04-24 MED ORDER — ZOLPIDEM TARTRATE 5 MG PO TABS
5.0000 mg | ORAL_TABLET | Freq: Every evening | ORAL | Status: DC | PRN
  Administered 2022-04-24: 5 mg via ORAL
  Filled 2022-04-24: qty 1

## 2022-04-24 MED ORDER — PROPOFOL 10 MG/ML IV BOLUS
INTRAVENOUS | Status: DC | PRN
Start: 2022-04-24 — End: 2022-04-24
  Administered 2022-04-24 (×2): 20 mg via INTRAVENOUS
  Administered 2022-04-24: 30 mg via INTRAVENOUS

## 2022-04-24 MED ORDER — VITAMIN B-12 1000 MCG PO TABS
1000.0000 ug | ORAL_TABLET | Freq: Every day | ORAL | Status: DC
  Administered 2022-04-24 – 2022-04-25 (×2): 1000 ug via ORAL
  Filled 2022-04-24 (×2): qty 1

## 2022-04-24 MED ORDER — HYDROCODONE-ACETAMINOPHEN 7.5-325 MG PO TABS: 1.0000 | ORAL_TABLET | ORAL | Status: DC | PRN

## 2022-04-24 MED ORDER — ASPIRIN EC 81 MG PO TBEC
81.0000 mg | DELAYED_RELEASE_TABLET | Freq: Every day | ORAL | Status: DC
  Administered 2022-04-25: 81 mg via ORAL
  Filled 2022-04-24: qty 1

## 2022-04-24 MED ORDER — CEFAZOLIN SODIUM-DEXTROSE 2-4 GM/100ML-% IV SOLN
INTRAVENOUS | Status: AC
Start: 2022-04-24 — End: 2022-04-24
  Filled 2022-04-24: qty 100

## 2022-04-24 MED ORDER — FENTANYL CITRATE (PF) 100 MCG/2ML IJ SOLN: 25.0000 ug | INTRAMUSCULAR | Status: DC | PRN

## 2022-04-24 MED ORDER — PHENOL 1.4 % MT LIQD: 1.0000 | OROMUCOSAL | Status: DC | PRN

## 2022-04-24 MED ORDER — EPHEDRINE SULFATE (PRESSORS) 50 MG/ML IJ SOLN
INTRAMUSCULAR | Status: DC | PRN
  Administered 2022-04-24 (×5): 5 mg via INTRAVENOUS

## 2022-04-24 MED ORDER — MAGNESIUM HYDROXIDE 400 MG/5ML PO SUSP
30.0000 mL | Freq: Every day | ORAL | Status: DC
  Filled 2022-04-24: qty 30

## 2022-04-24 MED ORDER — BISACODYL 5 MG PO TBEC: 5.0000 mg | DELAYED_RELEASE_TABLET | Freq: Every day | ORAL | Status: DC | PRN

## 2022-04-24 MED ORDER — CHLORHEXIDINE GLUCONATE 0.12 % MT SOLN: 15.0000 mL | Freq: Once | OROMUCOSAL | Status: AC

## 2022-04-24 MED ORDER — SODIUM CHLORIDE 0.9 % IV SOLN: INTRAVENOUS | Status: DC

## 2022-04-24 MED ORDER — METHOCARBAMOL 1000 MG/10ML IJ SOLN
500.0000 mg | Freq: Four times a day (QID) | INTRAVENOUS | Status: DC | PRN
  Filled 2022-04-24: qty 5

## 2022-04-24 MED ORDER — MENTHOL 3 MG MT LOZG: 1.0000 | LOZENGE | OROMUCOSAL | Status: DC | PRN

## 2022-04-24 MED ORDER — TRAMADOL HCL 50 MG PO TABS
50.0000 mg | ORAL_TABLET | Freq: Four times a day (QID) | ORAL | Status: DC
  Administered 2022-04-24 – 2022-04-25 (×4): 50 mg via ORAL
  Filled 2022-04-24 (×4): qty 1

## 2022-04-24 MED ORDER — PHENYLEPHRINE HCL-NACL 20-0.9 MG/250ML-% IV SOLN
INTRAVENOUS | Status: DC | PRN
  Administered 2022-04-24: 25 ug/min via INTRAVENOUS

## 2022-04-24 MED ORDER — METFORMIN HCL ER 500 MG PO TB24
1000.0000 mg | ORAL_TABLET | Freq: Every day | ORAL | Status: DC
  Administered 2022-04-25: 1000 mg via ORAL
  Filled 2022-04-24: qty 2

## 2022-04-24 MED ORDER — VITAMIN D 25 MCG (1000 UNIT) PO TABS
2000.0000 [IU] | ORAL_TABLET | Freq: Every day | ORAL | Status: DC
  Administered 2022-04-24 – 2022-04-25 (×2): 2000 [IU] via ORAL
  Filled 2022-04-24 (×2): qty 2

## 2022-04-24 MED ORDER — ORAL CARE MOUTH RINSE: 15.0000 mL | Freq: Once | OROMUCOSAL | Status: AC

## 2022-04-24 MED ORDER — FLEET ENEMA 7-19 GM/118ML RE ENEM: 1.0000 | ENEMA | Freq: Once | RECTAL | Status: DC | PRN

## 2022-04-24 MED ORDER — ENOXAPARIN SODIUM 40 MG/0.4ML IJ SOSY
40.0000 mg | PREFILLED_SYRINGE | INTRAMUSCULAR | Status: DC
  Administered 2022-04-25: 40 mg via SUBCUTANEOUS
  Filled 2022-04-24: qty 0.4

## 2022-04-24 MED ORDER — EPHEDRINE 5 MG/ML INJ
INTRAVENOUS | Status: AC
Start: 2022-04-24 — End: ?
  Filled 2022-04-24: qty 5

## 2022-04-24 MED ORDER — CEFAZOLIN IN SODIUM CHLORIDE 3-0.9 GM/100ML-% IV SOLN
3.0000 g | INTRAVENOUS | Status: AC
  Administered 2022-04-24: 3 g via INTRAVENOUS
  Filled 2022-04-24: qty 100

## 2022-04-24 MED ORDER — AMLODIPINE BESYLATE 10 MG PO TABS
10.0000 mg | ORAL_TABLET | Freq: Every day | ORAL | Status: DC
  Administered 2022-04-25: 10 mg via ORAL
  Filled 2022-04-24: qty 1

## 2022-04-24 MED ORDER — BUPIVACAINE LIPOSOME 1.3 % IJ SUSP
INTRAMUSCULAR | Status: AC
  Filled 2022-04-24: qty 40

## 2022-04-24 MED ORDER — PROPOFOL 10 MG/ML IV BOLUS
INTRAVENOUS | Status: AC
  Filled 2022-04-24: qty 20

## 2022-04-24 MED ORDER — PHENYLEPHRINE HCL-NACL 20-0.9 MG/250ML-% IV SOLN
INTRAVENOUS | Status: AC
  Filled 2022-04-24: qty 250

## 2022-04-24 MED ORDER — METOCLOPRAMIDE HCL 10 MG PO TABS: 5.0000 mg | ORAL_TABLET | Freq: Three times a day (TID) | ORAL | Status: DC | PRN

## 2022-04-24 MED ORDER — FAMOTIDINE 20 MG PO TABS
ORAL_TABLET | ORAL | Status: AC
  Administered 2022-04-24: 20 mg via ORAL
  Filled 2022-04-24: qty 1

## 2022-04-24 MED ORDER — POLYVINYL ALCOHOL 1.4 % OP SOLN: Freq: Every day | OPHTHALMIC | Status: DC | PRN

## 2022-04-24 MED ORDER — HEMOSTATIC AGENTS (NO CHARGE) OPTIME
TOPICAL | Status: DC | PRN
  Administered 2022-04-24: 2 via TOPICAL

## 2022-04-24 MED ORDER — POLYETHYLENE GLYCOL 3350 17 G PO PACK: 17.0000 g | PACK | Freq: Every day | ORAL | Status: DC | PRN

## 2022-04-24 MED ORDER — ONDANSETRON HCL 4 MG/2ML IJ SOLN
4.0000 mg | Freq: Four times a day (QID) | INTRAMUSCULAR | Status: DC | PRN
  Administered 2022-04-24: 4 mg via INTRAVENOUS
  Filled 2022-04-24: qty 2

## 2022-04-24 MED ORDER — DOCUSATE SODIUM 100 MG PO CAPS
100.0000 mg | ORAL_CAPSULE | Freq: Two times a day (BID) | ORAL | Status: DC
  Administered 2022-04-25: 100 mg via ORAL
  Filled 2022-04-24 (×2): qty 1

## 2022-04-24 MED ORDER — FENTANYL CITRATE (PF) 100 MCG/2ML IJ SOLN
INTRAMUSCULAR | Status: AC
  Filled 2022-04-24: qty 2

## 2022-04-24 MED ORDER — BUPIVACAINE HCL (PF) 0.5 % IJ SOLN
INTRAMUSCULAR | Status: AC
  Filled 2022-04-24: qty 10

## 2022-04-24 MED ORDER — GLYCOPYRROLATE 0.2 MG/ML IJ SOLN
INTRAMUSCULAR | Status: DC | PRN
  Administered 2022-04-24: .2 mg via INTRAVENOUS

## 2022-04-24 MED ORDER — BUPIVACAINE-EPINEPHRINE (PF) 0.25% -1:200000 IJ SOLN
INTRAMUSCULAR | Status: AC
  Filled 2022-04-24: qty 60

## 2022-04-24 MED ORDER — METOCLOPRAMIDE HCL 5 MG/ML IJ SOLN: 5.0000 mg | Freq: Three times a day (TID) | INTRAMUSCULAR | Status: DC | PRN

## 2022-04-24 SURGICAL SUPPLY — 54 items
BLADE SAGITTAL AGGR TOOTH XLG (BLADE) ×2 IMPLANT
BNDG COHESIVE 6X5 TAN ST LF (GAUZE/BANDAGES/DRESSINGS) ×5 IMPLANT
CANISTER WOUND CARE 500ML ATS (WOUND CARE) ×2 IMPLANT
CHLORAPREP W/TINT 26 (MISCELLANEOUS) ×2 IMPLANT
COVER BACK TABLE REUSABLE LG (DRAPES) ×2 IMPLANT
DRAPE 3/4 80X56 (DRAPES) ×5 IMPLANT
DRAPE C-ARM XRAY 36X54 (DRAPES) ×2 IMPLANT
DRAPE INCISE IOBAN 66X60 STRL (DRAPES) ×1 IMPLANT
DRAPE POUCH INSTRU U-SHP 10X18 (DRAPES) ×2 IMPLANT
DRESSING SURGICEL FIBRLLR 1X2 (HEMOSTASIS) ×2 IMPLANT
DRSG MEPILEX SACRM 8.7X9.8 (GAUZE/BANDAGES/DRESSINGS) ×2 IMPLANT
DRSG SURGICEL FIBRILLAR 1X2 (HEMOSTASIS) ×4
ELECT BLADE 6.5 EXT (BLADE) ×2 IMPLANT
ELECT REM PT RETURN 9FT ADLT (ELECTROSURGICAL) ×2
ELECTRODE REM PT RTRN 9FT ADLT (ELECTROSURGICAL) ×1 IMPLANT
GLOVE SURG SYN 9.0  PF PI (GLOVE) ×2
GLOVE SURG SYN 9.0 PF PI (GLOVE) ×2 IMPLANT
GLOVE SURG UNDER POLY LF SZ9 (GLOVE) ×2 IMPLANT
GOWN SRG 2XL LVL 4 RGLN SLV (GOWNS) ×1 IMPLANT
GOWN STRL NON-REIN 2XL LVL4 (GOWNS) ×1
GOWN STRL REUS W/ TWL LRG LVL3 (GOWN DISPOSABLE) ×1 IMPLANT
GOWN STRL REUS W/TWL LRG LVL3 (GOWN DISPOSABLE) ×1
HIP FEM HD L 28 (Head) ×1 IMPLANT
HOLDER FOLEY CATH W/STRAP (MISCELLANEOUS) ×2 IMPLANT
HOOD PEEL AWAY FLYTE STAYCOOL (MISCELLANEOUS) ×2 IMPLANT
KIT PREVENA INCISION MGT 13 (CANNISTER) ×2 IMPLANT
LINER DML 28MM HIGHCROSS (Liner) ×1 IMPLANT
MANIFOLD NEPTUNE II (INSTRUMENTS) ×2 IMPLANT
MASTERLOC HIP LATERAL S9 (Hips) ×1 IMPLANT
MAT ABSORB  FLUID 56X50 GRAY (MISCELLANEOUS) ×1
MAT ABSORB FLUID 56X50 GRAY (MISCELLANEOUS) ×1 IMPLANT
NDL SPNL 20GX3.5 QUINCKE YW (NEEDLE) ×2 IMPLANT
NEEDLE SPNL 20GX3.5 QUINCKE YW (NEEDLE) ×4 IMPLANT
NS IRRIG 1000ML POUR BTL (IV SOLUTION) ×2 IMPLANT
PACK HIP COMPR (MISCELLANEOUS) ×2 IMPLANT
SCALPEL PROTECTED #10 DISP (BLADE) ×3 IMPLANT
SHELL ACETABULAR DM  60MM (Shell) ×1 IMPLANT
SOLUTION IRRIG SURGIPHOR (IV SOLUTION) ×1 IMPLANT
STAPLER SKIN PROX 35W (STAPLE) ×2 IMPLANT
STRAP SAFETY 5IN WIDE (MISCELLANEOUS) ×2 IMPLANT
SUT DVC 2 QUILL PDO  T11 36X36 (SUTURE) ×1
SUT DVC 2 QUILL PDO T11 36X36 (SUTURE) ×1 IMPLANT
SUT SILK 0 (SUTURE) ×1
SUT SILK 0 30XBRD TIE 6 (SUTURE) ×1 IMPLANT
SUT V-LOC 90 ABS DVC 3-0 CL (SUTURE) ×2 IMPLANT
SUT VIC AB 1 CT1 36 (SUTURE) ×2 IMPLANT
SYR 20ML LL LF (SYRINGE) ×1 IMPLANT
SYR 30ML LL (SYRINGE) ×2 IMPLANT
SYR 50ML LL SCALE MARK (SYRINGE) ×4 IMPLANT
SYR BULB IRRIG 60ML STRL (SYRINGE) ×2 IMPLANT
TAPE MICROFOAM 4IN (TAPE) ×2 IMPLANT
TOWEL OR 17X26 4PK STRL BLUE (TOWEL DISPOSABLE) IMPLANT
TRAY FOLEY MTR SLVR 16FR STAT (SET/KITS/TRAYS/PACK) ×2 IMPLANT
WATER STERILE IRR 1000ML POUR (IV SOLUTION) ×1 IMPLANT

## 2022-04-24 NOTE — Transfer of Care (Signed)
Immediate Anesthesia Transfer of Care Note ? ?Patient: Evan [name] ? ?Procedure(s) Performed: TOTAL HIP ARTHROPLASTY ANTERIOR APPROACH (Right: Hip) ? ?Patient Location: PACU ? ?Anesthesia Type:General and Spinal ? ?Level of Consciousness: awake, alert  and oriented ? ?Airway & Oxygen Therapy: Patient Spontanous Breathing and Patient connected to face mask oxygen ? ?Post-op Assessment: Report given to RN and Post -op Vital signs reviewed and stable ? ?Post vital signs: Reviewed and stable ? ?Last Vitals:  ?Vitals Value Taken Time  ?BP 106/68 04/24/22 0938  ?Temp    ?Pulse 79 04/24/22 0940  ?Resp 21 04/24/22 0940  ?SpO2 100 % 04/24/22 0940  ?Vitals shown include unvalidated device data. ? ?Last Pain:  ?Vitals:  ? 04/24/22 0630  ?TempSrc: Temporal  ?PainSc: 0-No pain  ?   ? ?  ? ?Complications: No notable events documented. ?

## 2022-04-24 NOTE — Op Note (Signed)
04/24/2022 ? ?9:38 AM ? ?PATIENT:  Evan [name]  77 y.o. male ? 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 ?PROCEDURE:  Procedure(s): ?TOTAL HIP ARTHROPLASTY ANTERIOR APPROACH (Right) ? ?SURGEON: Laurene Footman, MD ? ?ASSISTANTS: None ? ?ANESTHESIA:   spinal ? ?EBL:  Total I/O ?In: 100 [IV Piggyback:100] ?Out: 250 [Urine:150; Blood:100] ? ?BLOOD ADMINISTERED:none ? ?DRAINS:  Incisional wound VAC   ? ?LOCAL MEDICATIONS USED:  MARCAINE    and OTHER Exparel ? ?SPECIMEN:  Source of Specimen:    Right femoral head ? ?DISPOSITION OF SPECIMEN:  PATHOLOGY ? ?COUNTS:  YES ? ?TOURNIQUET:  * No tourniquets in log * ? ?IMPLANTS: Medacta Masterloc 9 LAT stem with metal L 28 mm head, 60 mm Mpact DM cup and liner ? ?DICTATION: .Dragon Dictation   The patient was brought to the operating room and after spinal anesthesia was obtained patient was placed on the operative table with the ipsilateral foot into the Medacta attachment, contralateral leg on a well-padded table. C-arm was brought in and preop template x-ray taken. After prepping and draping in usual sterile fashion appropriate patient identification and timeout procedures were completed. Anterior approach to the hip was obtained and centered over the greater trochanter and TFL muscle. The subcutaneous tissue was incised hemostasis being achieved by electrocautery. TFL fascia was incised and the muscle retracted laterally deep retractor placed. The lateral femoral circumflex vessels were identified and ligated. The anterior capsule was exposed and a capsulotomy performed. The neck was identified and a femoral neck cut carried out with a saw. The head was removed without difficulty and showed sclerotic femoral head and acetabulum. Reaming was carried out to 59 mm and a 60 mm cup trial gave appropriate tightness to the acetabular component a 60 mm  DM cup was impacted into position. The leg was then externally rotated and ischiofemoral and pubofemoral releases carried out. The femur was sequentially broached to a size 8 then 9, size 9 LAT with L head trials were placed and the final components chosen. The 9 lateralized stem was inserted along with a metal L 28 mm head and 60 mm liner. The hip was reduced and was stable the wound was thoroughly irrigated with fibrillar placed along the posterior capsule and medial neck. The deep fascia ws closed using a heavy Quill after infiltration of 30 cc of quarter percent Sensorcaine with epinephrine.3-0 V-loc to close the skin with skin staples.  Incisional wound VAC applied and patient was sent to recovery in stable condition.  ? ?PLAN OF CARE: Admit for overnight observation ? ?

## 2022-04-24 NOTE — Anesthesia Preprocedure Evaluation (Addendum)
Anesthesia Evaluation  ?Patient identified by MRN, date of birth, ID band ?Patient awake ? ? ? ?Reviewed: ?Allergy & Precautions, H&P , NPO status , Patient's Chart, lab work & pertinent test results, reviewed documented beta blocker date and time  ? ?Airway ?Mallampati: II ? ? ?Neck ROM: full ? ? ? Dental ? ?(+) Poor Dentition ?  ?Pulmonary ?sleep apnea and Continuous Positive Airway Pressure Ventilation , COPD,  COPD inhaler, former smoker,  ?  ?Pulmonary exam normal ? ? ? ? ? ? ? Cardiovascular ?Exercise Tolerance: Poor ?hypertension, On Medications ?(-) CHF, (-) Orthopnea and (-) PND Atrial Fibrillation  ?Rhythm:regular Rate:Normal ? ? ?  ?Neuro/Psych ?negative neurological ROS ? negative psych ROS  ? GI/Hepatic ?negative GI ROS, Neg liver ROS,   ?Endo/Other  ?negative endocrine ROSdiabetes, Well Controlled, Type 2, Oral Hypoglycemic Agents ? Renal/GU ?Renal disease  ?negative genitourinary ?  ?Musculoskeletal ? ? Abdominal ?  ?Peds ? Hematology ? ?(+) Blood dyscrasia, anemia ,   ?Anesthesia Other Findings ?Past Medical History: ?No date: Adenomatous polyp of colon ?No date: Arthritis ?    Comment:  xDJD ?No date: Atrial fibrillation ([place]) ?No date: BPH (benign prostatic hyperplasia) ?No date: CHF (congestive heart failure) ([place]) ?No date: COPD (chronic obstructive pulmonary disease) ([place]) ?No date: Diabetes mellitus without complication ([place]) ?    Comment:  type 2 ?No date: Discitis ?No date: Diverticulitis ?No date: Diverticulosis ?No date: DVT (deep venous thrombosis) ([place]) ?    Comment:  bilateral tibial ?No date: Erectile dysfunction ?No date: Hearing loss ?No date: Hyperlipidemia ?No date: Hypertension ?No date: Ischemic optic neuropathy ?No date: Lower GI bleeding ?No date: Lumbar degenerative disc disease ?No date: Non-ischemic cardiomyopathy ([place]) ?No date: Sleep apnea ?Past Surgical History: ?2010: ATRIAL ABLATION SURGERY ?10/10/2020: CATARACT EXTRACTION W/PHACO;  Right ?    Comment:  Procedure: CATARACT EXTRACTION PHACO AND INTRAOCULAR  ?             LENS PLACEMENT (IOC) RIGHT ISTENT INJ DIABETIC 2.64   ?             00:29.4;  Surgeon: Eulogio Bear, MD;  Location:  ?             [facility];  Service: Ophthalmology;   ?             Laterality: Right;  Diabetic - oral meds ?03/06/2021: CATARACT EXTRACTION W/PHACO; Left ?    Comment:  Procedure: CATARACT EXTRACTION PHACO AND INTRAOCULAR  ?             LENS PLACEMENT (IOC) LEFT DIABETIC ISTENT INJ 3.62  ?             00:30.6;  Surgeon: Eulogio Bear, MD;  Location:  ?             [place];  Service: Ophthalmology;   ?             Laterality: Left;  Diabetic - oral meds ?2011: CHOLECYSTECTOMY ?02/24/2018: COLONOSCOPY; Left ?    Comment:  Procedure: COLONOSCOPY;  Surgeon: Virgel Manifold,  ?             MD;  Location: [facility];  Service: Endoscopy;   ?             Laterality: Left; ?No date: COLONOSCOPY ?    Comment:  2008, 2010, 2013 ?09/25/2017: COLONOSCOPY WITH PROPOFOL; N/A ?    Comment:  Procedure: COLONOSCOPY WITH PROPOFOL;  Surgeon: Vira Agar, ?  Gavin Pound, MD;  Location: [facility];  Service:  ?             Endoscopy;  Laterality: N/A; ?02/18/2018: COLONOSCOPY WITH PROPOFOL; N/A ?    Comment:  Procedure: COLONOSCOPY WITH PROPOFOL;  Surgeon: Allen Norris,  ?             Darren, MD;  Location: [facility];  Service:  ?             Endoscopy;  Laterality: N/A; ?2011: ERCP ?02/23/2018: ESOPHAGOGASTRODUODENOSCOPY; Left ?    Comment:  Procedure: ESOPHAGOGASTRODUODENOSCOPY (EGD);  Surgeon:  ?             Virgel Manifold, MD;  Location: [facility];   ?             Service: Endoscopy;  Laterality: Left; ?2014: ESOPHAGOGASTRODUODENOSCOPY ?04/17/2022: IVC FILTER INSERTION; N/A ?    Comment:  Procedure: IVC FILTER INSERTION;  Surgeon: Delana Meyer,  ?             Dolores Lory, MD;  Location: [facility];  Service: ?             Cardiovascular;  Laterality: N/A; ?01/01/2017:  LAPAROTOMY ?    Comment:  Procedure: EXPLORATORY LAPAROTOMY;  Surgeon: Jacqulyn Bath  ?             Piscoya, MD;  Location: [facility];  Service: General;; ?01/01/2017: PARTIAL COLECTOMY; N/A ?    Comment:  Procedure: PARTIAL COLECTOMY;  Surgeon: Olean Ree,  ?             MD;  Location: [facility];  Service: General;  Laterality:  ?             N/A; ?12/28/2016: PERIPHERAL VASCULAR CATHETERIZATION; N/A ?    Comment:  Procedure: Visceral Artery Intervention;  Surgeon: Corene Cornea ?             Bunnie Domino, MD;  Location: [facility];  Service:  ?             Cardiovascular;  Laterality: N/A; ?01/08/2017: SECONDARY CLOSURE OF WOUND; N/A ?    Comment:  Procedure: SECONDARY CLOSURE FOR EVISCERATION;  Surgeon: ?             Florene Glen, MD;  Location: [facility];  Service:  ?             General;  Laterality: N/A; ?2018: SIGMOIDECTOMY ?03/24/2014: TOTAL KNEE ARTHROPLASTY; Right ?    Comment:  times 4 ? ? Reproductive/Obstetrics ?negative OB ROS ? ?  ? ? ? ? ? ? ? ? ? ? ? ? ? ?  ?  ? ? ? ? ? ? ? ? ?Anesthesia Physical ?Anesthesia Plan ? ?ASA: 3 ? ?Anesthesia Plan: Spinal  ? ?Post-op Pain Management:   ? ?Induction:  ? ?PONV Risk Score and Plan: 3 ? ?Airway Management Planned:  ? ?Additional Equipment:  ? ?Intra-op Plan:  ? ?Post-operative Plan:  ? ?Informed Consent: I have reviewed the patients History and Physical, chart, labs and discussed the procedure including the risks, benefits and alternatives for the proposed anesthesia with the patient or authorized representative who has indicated his/her understanding and acceptance.  ? ? ? ?Dental Advisory Given ? ?Plan Discussed with: CRNA ? ?Anesthesia Plan Comments:   ? ? ? ? ? ?Anesthesia Quick Evaluation ? ?

## 2022-04-24 NOTE — H&P (Signed)
Chief Complaint  ?Patient presents with  ? Pre-op Exam  ?Right THA scheduled 04/24/22 with Dr. Rudene Christians  ? ? ?History of the Present Illness: ?Evan [name] is a 77 y.o. male here today for history and physical for right total hip arthroplasty with Dr. Hessie Knows on 04/24/2022. Patient with history of blood clot, is scheduled to undergo IVC filter placement tomorrow. Patient has history of right total knee arthroplasty, continues to have some anterior thigh pain, groin lateral hip and buttocks pain. X-rays show advanced right hip degenerative changes with near complete loss of central joint space with large inferior and superior acetabular spurring with slight deformity of the femoral head. Pain is moderate to severe. Patient tries to remain active but pain is limiting his ability to go to the gym and exercise as. Patient was riding his bicycle and swimming. ? ?The patient had a right total knee arthroplasty in 2015. He was told he had blood clots after he came out of surgery in 2015. He had diverticulitis in 2018. He had been on a blood thinner, but he does not take anything now. He notes he has not had any problem with blood thinners. ? ?The patient is retired. ? ?I have reviewed past medical, surgical, social and family history, and allergies as documented in the EMR. ? ?Past Medical History: ?Past Medical History:  ?Diagnosis Date  ? Acute lower GI bleeding 01/01/2017  ?failed embolization, so underwent sigmoidectomy  ? Anemia 11/07/2014  ? Atrial fibrillation and flutter 11/07/2014  ?S/P ablation 08/2009  ? BPH (benign prostatic hyperplasia)  ? Congestive heart failure (CMS-HCC) 11/08/2015  ? 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 COPD (chronic obstructive pulmonary disease) (CMS-HCC)  ? Diverticulosis  ? ED (erectile dysfunction)  ? Hearing loss 11/07/2014  ? History of adenomatous polyp of colon 01/31/2007  ?due for colonoscopy 08/23/2017  ? History of discitis   ?lumbar diskitis 05/2010  ? History of GI diverticular bleed 2013  ?hospitalized for diverticular bleed requiring embolization  ? Hypercholesteremia 11/07/2014  ? Hypertension 11/07/2014  ? 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 Lumbar degenerative disc disease 11/07/2014  ? Non-ischemic cardiomyopathy (CMS-HCC) 11/07/2014  ?Dr Mylinda Latina- [facility] ( seen on Fridays at [facility])  ? Obesity  ? 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 Sleep apnea 05/04/2015  ?on auto CPAP per Dr. Raul Del  ? ?Past Surgical History: ?Past Surgical History:  ?Procedure Laterality Date  ? COLONOSCOPY 01/31/2007  ?Adenomatous Polyps  ? FLEXIBLE SIGMOIDOSCOPY 05/27/2007  ?Adenomatous Polyp  ? ABLATION BY RADIOFREQUENCY AV NODE FUNCTION 2010  ?for atrial fib/flutter  ? LAPAROSCOPIC CHOLECYSTECTOMY 04/2010  ? ERCP 05/10/2010  ? EGD 10/15/2013  ?Gastritis: No repeat per [facility]  ? REPLACEMENT TOTAL KNEE Right 03/24/2014  ? SIGMOID RESECTION / RECTOPEXY N/A 01/01/2017  ?for lower GI bleed with continued bleeding despite embolization  ? COLONOSCOPY 09/25/2017  ?PH Adenomatous Polyps: CBF 08/2022  ? COLONOSCOPY 08/23/2012, 02/10/2009  ?PH Adenomatous Polyps: CBF 07/2017  ? Embolization  ?for diverticular bleed  ? ?Past Family History: ?Family History  ?Problem Relation Age of Onset  ? Ovarian cancer Sister  ? Diabetes type II Sister  ? Heart disease Sister  ? Coronary Artery Disease (Blocked arteries around heart) Father  ? Myocardial Infarction (Heart attack) Brother  ?MI  ? Diabetes Brother  ? Myocardial Infarction (Heart attack) Sister  ? Sudden  death (unexpected death due to unknown cause) Sister  ? Ovarian cancer Sister  ? Glaucoma Neg Hx  ? Macular degeneration Neg Hx  ? ?Medications: ?Current Outpatient Medications Ordered in Epic  ?Medication Sig Dispense Refill  ? acetaminophen (TYLENOL) 500 MG tablet Take 1,000 mg by mouth every 8 (eight) hours as needed for Pain   ? amLODIPine (NORVASC) 10 MG tablet Take 1 tablet (10 mg total) by mouth once daily For blood pressure 90 tablet 3  ? aspirin 81 mg tablet 1 tab by mouth daily  ? blood glucose diagnostic (CONTOUR TEST STRIPS) test strip once daily as instructed. 100 strip 3  ? blood glucose meter kit once daily as directed 1 each 0  ? cholecalciferol (VITAMIN D3) 2,000 unit capsule Take 2,000 Units by mouth once daily.  ? cyanocobalamin (VITAMIN B12) 1000 MCG tablet Take 1,000 mcg by mouth once daily  ? hydrocodone-chlorpheniramine (TUSSIONEX) 10-8 mg/5 mL ER suspension Take 5 mLs by mouth every 12 (twelve) hours as needed for Cough (Patient not taking: Reported on 02/01/2022) 115 mL 0  ? hydrOXYzine (ATARAX) 25 MG tablet TAKE ONE TABLET BY MOUTH AT BEDTIME AS NEEDED 90 tablet 1  ? metFORMIN (GLUCOPHAGE-XR) 500 MG XR tablet Take 2 tablets (1,000 mg total) by mouth daily with breakfast For diabetes 180 tablet 3  ? multivitamin-lutein (CENTRUM SILVER) tablet Take 1 tablet by mouth once daily.  ? polyethylene glycol (MIRALAX) powder Take 17-34 g by mouth once daily Mix in 4-8ounces of fluid prior to taking.  ? pravastatin (PRAVACHOL) 40 MG tablet Take 1 tablet (40 mg total) by mouth once daily For cholesterol 90 tablet 3  ? traZODone (DESYREL) 50 MG tablet Take 1 tablet (50 mg total) by mouth at bedtime 30 tablet 11  ? ?No current Epic-ordered facility-administered medications on file.  ? ?Allergies: ?Allergies  ?Allergen Reactions  ? Penicillins Rash  ?Has patient had a PCN reaction causing immediate rash, facial/tongue/throat swelling, SOB or lightheadedness with hypotension: Yes ?Has patient had a PCN reaction causing severe rash involving mucus membranes or skin necrosis: Yes ?Has patient had a PCN reaction that required hospitalization No ?Has patient had a PCN reaction occurring within the last 10 years: No ?If all of the above answers are "NO", then may proceed with Cephalosporin use.  ? Zithromax [Azithromycin] Rash  ?  Lisinopril Cough  ? Losartan Cough  ? Metoprolol Succinate Other (See Comments)  ?Bradycardia.  ? ? ?Body mass index is 35.75 kg/m?. ? ?Review of Systems: ?A comprehensive 14 point ROS was performed, reviewed, and the pertinent orthopaedic findings are documented in the HPI. ? ?Vitals:  ?04/16/22 1405  ?BP: (!) 140/82  ? ? ?General Physical Examination:  ? ?General:  ?Well developed, well nourished, no apparent distress, normal affect, normal gait with no antalgic component.  ? ?HEENT: ?Head normocephalic, atraumatic, PERRL.  ? ?Abdomen: ?Soft, non tender, non distended, Bowel sounds present. ? ?Heart: ?Examination of the heart reveals regular, rate, and rhythm. There is no murmur noted on ascultation. There is a normal apical pulse. ? ?Lungs: ?Lungs are clear to auscultation. There is no wheeze, rhonchi, or crackles. There is normal expansion of bilateral chest walls.  ? ?Right hip: ?On exam, left hip internal rotation is 40 degrees, external is 40 degrees with pain in the groin. Right hip internal rotation is 20 degrees, external is 20 degrees with pain. Right knee has 10 degrees of flexion contracture. No swelling in the right knee, right knee incision site is  intact. No effusion. Negative Homans' sign with no edema in bilateral lower extremities. ? ?Radiographs: ? ?AP pelvis and lateral x-rays of the right hip were reviewed by me today. These show severe central hip arthritis, large osteophytes on the inferior head and neck on the AP view. No evidence of fracture. Lateral view shows anterior osteophytes on the femoral neck as well as complete loss of central joint space and osteophytes to the inferior acetabulum and head. There are subchondral cysts as well. ? ?X-ray Impression: ?Advanced central right hip arthritis, moderate left central hip arthritis. ? ?Assessment: ?ICD-10-CM  ?1. Primary osteoarthritis of right hip M16.11  ? ?Plan: ? ?77 year old male with advanced right hip osteoarthritis. Pain severe and  interfering with quality of life and activities daily living. Risks, benefits, complications of a right total hip arthroplasty have been discussed with the patient. Patient has agreed and consented the procedure

## 2022-04-24 NOTE — Anesthesia Procedure Notes (Signed)
Spinal ? ?Patient location during procedure: OR ?Start time: 04/24/2022 7:18 AM ?End time: 04/24/2022 7:34 AM ?Reason for block: surgical anesthesia ?Staffing ?Performed: anesthesiologist  ?Anesthesiologist: [name], MD ?Resident/CRNA: Demetrius Charity, CRNA ?Other anesthesia staff: Timmothy Euler, RN ?Preanesthetic Checklist ?Completed: patient identified, IV checked, site marked, risks and benefits discussed, surgical consent, monitors and equipment checked, pre-op evaluation and timeout performed ?Spinal Block ?Patient position: sitting ?Prep: Betadine ?Patient monitoring: heart rate, continuous pulse ox, blood pressure and cardiac monitor ?Approach: right paramedian ?Location: L4-5 ?Injection technique: single-shot ?Needle ?Needle type: Quincke and Whitacre  ?Needle gauge: 24 G ?Needle length: 9 cm ?Assessment ?Sensory level: T10 ?Events: CSF return ?Additional Notes ?Jetty Peeks with one attempt, CRNA with one attempt. Dr. Andree Elk called to room and proceeded with paramedian approach.  Negative paresthesia. Negative blood return. Positive free-flowing CSF. Expiration date of kit checked and confirmed. Patient tolerated procedure well, without complications. ? ? ? ? ? ?

## 2022-04-24 NOTE — TOC Progression Note (Signed)
Transition of Care (TOC) - Progression Note  ? ? ?Patient Details  ?Name: Evan [name] ?MRN: [id number] ?Date of Birth: 1945-01-13 ? ?Transition of Care (TOC) CM/SW Contact  ?Nikolus Marczak A Christoper Bushey, LCSW ?Phone Number: ?04/24/2022, 3:06 PM ? ?Clinical Narrative:   Patient was set up with [facility] prior to surgery via surgeons office.  ? ? ? ?  ?  ? ?Expected Discharge Plan and Services ?  ?  ?  ?  ?  ?                ?  ?  ?  ?  ?  ?  ?  ?  ?  ?  ? ? ?Social Determinants of Health (SDOH) Interventions ?  ? ?Readmission Risk Interventions ?   ? View : No data to display.  ?  ?  ?  ? ? ?

## 2022-04-24 NOTE — Evaluation (Signed)
Physical Therapy Evaluation ?Patient Details ?Name: Evan [name] ?MRN: [id number] ?DOB: November 26, 1945 ?Today's Date: 04/24/2022 ? ?History of Present Illness ? Pt is a 77 yo male s/p R THA, WBAT with direct anterior approach. PMH of COPD, sleep apnea, HTN, afib, DMII, CHF, DVT, partial colectomy, IVC filter placed, R TKA. ?  ?Clinical Impression ? Patient alert, agreeable to PT, reported minimal pain at start of session but with mobility and exercises pain increased to 4-5/10. Per pt he was independent at baseline and has family available to assist as needed at discharge. ? ?He was able to perform several supine exercises, AAROM for heel slides to assist with sliding and due to pain. Supine to sit with CGA, good sitting balance noted. Sit <> stand from EOB and from recliner, CGA-supervision, cued for hand placement with fair carry over. He was able to ambulated ~25f with CGA and RW, no LOB noted, but decreased gait velocity as well as decreased RLE weight bearing. Returned to recliner in room, all questions answered as able.  Overall the patient demonstrated deficits (see "PT Problem List") that impede the patient's functional abilities, safety, and mobility and would benefit from skilled PT intervention. Recommendation at this time is HHPT to maximize pt function and return to PLOF.  ?   ?   ? ?Recommendations for follow up therapy are one component of a multi-disciplinary discharge planning process, led by the attending physician.  Recommendations may be updated based on patient status, additional functional criteria and insurance authorization. ? ?Follow Up Recommendations Home health PT ? ?  ?Assistance Recommended at Discharge Intermittent Supervision/Assistance  ?Patient can return home with the following ? A little help with bathing/dressing/bathroom;Assistance with cooking/housework;Assist for transportation;Help with stairs or ramp for entrance ? ?  ?Equipment Recommendations None recommended by PT   ?Recommendations for Other Services ?    ?  ?Functional Status Assessment Patient has had a recent decline in their functional status and demonstrates the ability to make significant improvements in function in a reasonable and predictable amount of time.  ? ?  ?Precautions / Restrictions Precautions ?Precautions: Fall ?Restrictions ?Weight Bearing Restrictions: Yes ?RLE Weight Bearing: Weight bearing as tolerated  ? ?  ? ?Mobility ? Bed Mobility ?Overal bed mobility: Needs Assistance ?Bed Mobility: Supine to Sit ?  ?  ?Supine to sit: Min guard, HOB elevated ?  ?  ?General bed mobility comments: use of bed rails ?  ? ?Transfers ?Overall transfer level: Needs assistance ?Equipment used: Rolling walker (2 wheels) ?Transfers: Sit to/from Stand, Bed to chair/wheelchair/BSC ?Sit to Stand: Min guard, Supervision ?  ?Step pivot transfers: Min guard ?  ?  ?  ?General transfer comment: cues for RW use and hand placement ?  ? ?Ambulation/Gait ?Ambulation/Gait assistance: Min guard ?Gait Distance (Feet): 45 Feet ?Assistive device: Rolling walker (2 wheels) ?  ?  ?  ?  ?General Gait Details: decreased velocity as well as weight bearing in RLE ? ?Stairs ?  ?  ?  ?  ?  ? ?Wheelchair Mobility ?  ? ?Modified Rankin (Stroke Patients Only) ?  ? ?  ? ?Balance Overall balance assessment: Needs assistance ?Sitting-balance support: Feet supported ?Sitting balance-Leahy Scale: Good ?  ?  ?  ?Standing balance-Leahy Scale: Fair ?  ?  ?  ?  ?  ?  ?  ?  ?  ?  ?  ?  ?   ? ? ? ?Pertinent Vitals/Pain Pain Assessment ?Pain Assessment: 0-10 ?Pain Score: 5  ?Pain  Location: R hip with mobility ?Pain Descriptors / Indicators: Sore, Other (Comment) (stiff) ?Pain Intervention(s): Limited activity within patient's tolerance, Monitored during session, Repositioned  ? ? ?Home Living Family/patient expects to be discharged to:: Private residence ?Living Arrangements: Spouse/significant other ?Available Help at Discharge: Family ?Type of Home: House ?Home  Access: Stairs to enter ?Entrance Stairs-Rails: None ?Entrance Stairs-Number of Steps: 1 ?  ?Home Layout: One level ?Home Equipment: Conservation officer, nature (2 wheels);Cane - single point;Crutches;BSC/3in1 ?   ?  ?Prior Function Prior Level of Function : Independent/Modified Independent ?  ?  ?  ?  ?  ?  ?  ?  ?  ? ? ?Hand Dominance  ?   ? ?  ?Extremity/Trunk Assessment  ? Upper Extremity Assessment ?Upper Extremity Assessment: Overall WFL for tasks assessed ?  ? ?Lower Extremity Assessment ?Lower Extremity Assessment: Generalized weakness ?  ? ?Cervical / Trunk Assessment ?Cervical / Trunk Assessment: Normal  ?Communication  ? Communication: No difficulties;HOH  ?Cognition Arousal/Alertness: Awake/alert ?Behavior During Therapy: [facility] for tasks assessed/performed ?Overall Cognitive Status: Within Functional Limits for tasks assessed ?  ?  ?  ?  ?  ?  ?  ?  ?  ?  ?  ?  ?  ?  ?  ?  ?  ?  ?  ? ?  ?General Comments   ? ?  ?Exercises Total Joint Exercises ?Quad Sets: AROM, Strengthening, Right, Left ?Heel Slides: AAROM, Strengthening, Right, 10 reps ?Hip ABduction/ADduction: AROM, Right, 10 reps  ? ?Assessment/Plan  ?  ?PT Assessment Patient needs continued PT services  ?PT Problem List Decreased strength;Decreased mobility;Decreased range of motion;Decreased activity tolerance;Decreased balance;Pain;Decreased knowledge of use of DME ? ?   ?  ?PT Treatment Interventions DME instruction;Therapeutic exercise;Gait training;Balance training;Stair training;Neuromuscular re-education;Functional mobility training;Therapeutic activities;Patient/family education   ? ?PT Goals (Current goals can be found in the Care Plan section)  ?Acute Rehab PT Goals ?Patient Stated Goal: to go home ?PT Goal Formulation: With patient ?Time For Goal Achievement: 05/08/22 ?Potential to Achieve Goals: Good ? ?  ?Frequency BID ?  ? ? ?Co-evaluation   ?  ?  ?  ?  ? ? ?  ?AM-PAC PT "6 Clicks" Mobility  ?Outcome Measure Help needed turning from your back to your  side while in a flat bed without using bedrails?: A Little ?Help needed moving from lying on your back to sitting on the side of a flat bed without using bedrails?: A Little ?Help needed moving to and from a bed to a chair (including a wheelchair)?: None ?Help needed standing up from a chair using your arms (e.g., wheelchair or bedside chair)?: None ?Help needed to walk in hospital room?: A Little ?Help needed climbing 3-5 steps with a railing? : A Little ?6 Click Score: 20 ? ?  ?End of Session Equipment Utilized During Treatment: Gait belt ?Activity Tolerance: Patient tolerated treatment well ?Patient left: in chair;with call bell/phone within reach;with family/visitor present ?Nurse Communication: Mobility status ?PT Visit Diagnosis: Other abnormalities of gait and mobility (R26.89);Difficulty in walking, not elsewhere classified (R26.2);Muscle weakness (generalized) (M62.81);Pain ?Pain - Right/Left: Right ?Pain - part of body: Hip ?  ? ?Time: 1517-6160 ?PT Time Calculation (min) (ACUTE ONLY): 39 min ? ? ?Charges:   PT Evaluation ?$PT Eval Low Complexity: 1 Low ?PT Treatments ?$Therapeutic Exercise: 8-22 mins ?$Therapeutic Activity: 8-22 mins ?  ?   ? ?Lieutenant Diego PT, DPT ?2:59 PM,04/24/22 ? ?

## 2022-04-24 NOTE — Plan of Care (Signed)
?  Problem: Education: ?Goal: Knowledge of the prescribed therapeutic regimen will improve ?Outcome: Progressing ?  ?Problem: Clinical Measurements: ?Goal: Postoperative complications will be avoided or minimized ?Outcome: Progressing ?  ?Problem: Pain Management: ?Goal: Pain level will decrease with appropriate interventions ?Outcome: Progressing ?  ?Problem: Education: ?Goal: Knowledge of General Education information will improve ?Description: Including pain rating scale, medication(s)/side effects and non-pharmacologic comfort measures ?Outcome: Progressing ?  ?Problem: Clinical Measurements: ?Goal: Ability to maintain clinical measurements within normal limits will improve ?Outcome: Progressing ?  ?

## 2022-04-25 ENCOUNTER — Encounter: Payer: Self-pay | Admitting: Orthopedic Surgery

## 2022-04-25 DIAGNOSIS — M1611 Unilateral primary osteoarthritis, right hip: Secondary | ICD-10-CM | POA: Diagnosis not present

## 2022-04-25 LAB — CBC
HCT: 47.2 % (ref 39.0–52.0)
Hemoglobin: 15.1 g/dL (ref 13.0–17.0)
MCH: 27.3 pg (ref 26.0–34.0)
MCHC: 32 g/dL (ref 30.0–36.0)
MCV: 85.2 fL (ref 80.0–100.0)
Platelets: 252 10*3/uL (ref 150–400)
RBC: 5.54 MIL/uL (ref 4.22–5.81)
RDW: 14.2 % (ref 11.5–15.5)
WBC: 9.6 10*3/uL (ref 4.0–10.5)
nRBC: 0 % (ref 0.0–0.2)

## 2022-04-25 LAB — GLUCOSE, CAPILLARY
Glucose-Capillary: 149 mg/dL — ABNORMAL HIGH (ref 70–99)
Glucose-Capillary: 164 mg/dL — ABNORMAL HIGH (ref 70–99)

## 2022-04-25 LAB — BASIC METABOLIC PANEL
Anion gap: 6 (ref 5–15)
BUN: 10 mg/dL (ref 8–23)
CO2: 29 mmol/L (ref 22–32)
Calcium: 8.8 mg/dL — ABNORMAL LOW (ref 8.9–10.3)
Chloride: 98 mmol/L (ref 98–111)
Creatinine, Ser: 0.83 mg/dL (ref 0.61–1.24)
GFR, Estimated: >60 mL/min (ref >60)
Glucose, Bld: 192 mg/dL — ABNORMAL HIGH (ref 70–99)
Potassium: 4.5 mmol/L (ref 3.5–5.1)
Sodium: 133 mmol/L — ABNORMAL LOW (ref 135–145)

## 2022-04-25 LAB — SURGICAL PATHOLOGY

## 2022-04-25 MED ORDER — HYDROCODONE-ACETAMINOPHEN 7.5-325 MG PO TABS: 1.0000 | ORAL_TABLET | ORAL | 0 refills | Status: DC | PRN

## 2022-04-25 MED ORDER — ENOXAPARIN SODIUM 40 MG/0.4ML IJ SOSY: 40.0000 mg | PREFILLED_SYRINGE | INTRAMUSCULAR | 0 refills | Status: DC

## 2022-04-25 MED ORDER — TRAMADOL HCL 50 MG PO TABS: 50.0000 mg | ORAL_TABLET | Freq: Four times a day (QID) | ORAL | 0 refills | Status: DC | PRN

## 2022-04-25 MED ORDER — DOCUSATE SODIUM 100 MG PO CAPS: 100.0000 mg | ORAL_CAPSULE | Freq: Two times a day (BID) | ORAL | 0 refills | Status: DC

## 2022-04-25 MED ORDER — METHOCARBAMOL 500 MG PO TABS
500.0000 mg | ORAL_TABLET | Freq: Four times a day (QID) | ORAL | 0 refills | Status: DC | PRN
Start: 2022-04-25 — End: 2022-09-10

## 2022-04-25 NOTE — Plan of Care (Signed)
?  Problem: Education: ?Goal: Knowledge of the prescribed therapeutic regimen will improve ?Outcome: Adequate for Discharge ?Goal: Understanding of discharge needs will improve ?Outcome: Adequate for Discharge ?Goal: Individualized Educational Video(s) ?Outcome: Adequate for Discharge ?  ?Problem: Activity: ?Goal: Ability to avoid complications of mobility impairment will improve ?Outcome: Adequate for Discharge ?Goal: Ability to tolerate increased activity will improve ?Outcome: Adequate for Discharge ?  ?Problem: Clinical Measurements: ?Goal: Postoperative complications will be avoided or minimized ?Outcome: Adequate for Discharge ?  ?Problem: Pain Management: ?Goal: Pain level will decrease with appropriate interventions ?Outcome: Adequate for Discharge ?  ?Problem: Skin Integrity: ?Goal: Will show signs of wound healing ?Outcome: Adequate for Discharge ?  ?Problem: Education: ?Goal: Knowledge of General Education information will improve ?Description: Including pain rating scale, medication(s)/side effects and non-pharmacologic comfort measures ?Outcome: Adequate for Discharge ?  ?Problem: Health Behavior/Discharge Planning: ?Goal: Ability to manage health-related needs will improve ?Outcome: Adequate for Discharge ?  ?Problem: Clinical Measurements: ?Goal: Ability to maintain clinical measurements within normal limits will improve ?Outcome: Adequate for Discharge ?Goal: Will remain free from infection ?Outcome: Adequate for Discharge ?Goal: Diagnostic test results will improve ?Outcome: Adequate for Discharge ?Goal: Respiratory complications will improve ?Outcome: Adequate for Discharge ?Goal: Cardiovascular complication will be avoided ?Outcome: Adequate for Discharge ?  ?Problem: Activity: ?Goal: Risk for activity intolerance will decrease ?Outcome: Adequate for Discharge ?  ?Problem: Nutrition: ?Goal: Adequate nutrition will be maintained ?Outcome: Adequate for Discharge ?  ?Problem: Coping: ?Goal: Level of  anxiety will decrease ?Outcome: Adequate for Discharge ?  ?Problem: Elimination: ?Goal: Will not experience complications related to bowel motility ?Outcome: Adequate for Discharge ?Goal: Will not experience complications related to urinary retention ?Outcome: Adequate for Discharge ?  ?Problem: Pain Managment: ?Goal: General experience of comfort will improve ?Outcome: Adequate for Discharge ?  ?Problem: Safety: ?Goal: Ability to remain free from injury will improve ?Outcome: Adequate for Discharge ?  ?Problem: Skin Integrity: ?Goal: Risk for impaired skin integrity will decrease ?Outcome: Adequate for Discharge ?  ?Problem: Acute Rehab PT Goals(only PT should resolve) ?Goal: Pt Will Go Supine/Side To Sit ?Outcome: Adequate for Discharge ?Goal: Patient Will Transfer Sit To/From Stand ?Outcome: Adequate for Discharge ?Goal: Pt Will Ambulate ?Outcome: Adequate for Discharge ?Goal: Pt Will Go Up/Down Stairs ?Outcome: Adequate for Discharge ?  ?

## 2022-04-25 NOTE — Plan of Care (Signed)
?  Problem: Education: ?Goal: Knowledge of the prescribed therapeutic regimen will improve ?Outcome: Progressing ?Goal: Understanding of discharge needs will improve ?Outcome: Progressing ?Goal: Individualized Educational Video(s) ?Outcome: Progressing ?  ?Problem: Activity: ?Goal: Ability to avoid complications of mobility impairment will improve ?Outcome: Progressing ?Goal: Ability to tolerate increased activity will improve ?Outcome: Progressing ?  ?Problem: Clinical Measurements: ?Goal: Postoperative complications will be avoided or minimized ?Outcome: Progressing ?  ?Problem: Pain Management: ?Goal: Pain level will decrease with appropriate interventions ?Outcome: Progressing ?  ?Problem: Education: ?Goal: Knowledge of General Education information will improve ?Description: Including pain rating scale, medication(s)/side effects and non-pharmacologic comfort measures ?Outcome: Progressing ?  ?Problem: Health Behavior/Discharge Planning: ?Goal: Ability to manage health-related needs will improve ?Outcome: Progressing ?  ?Problem: Clinical Measurements: ?Goal: Ability to maintain clinical measurements within normal limits will improve ?Outcome: Progressing ?Goal: Will remain free from infection ?Outcome: Progressing ?Goal: Diagnostic test results will improve ?Outcome: Progressing ?Goal: Respiratory complications will improve ?Outcome: Progressing ?Goal: Cardiovascular complication will be avoided ?Outcome: Progressing ?  ?Problem: Activity: ?Goal: Risk for activity intolerance will decrease ?Outcome: Progressing ?  ?Problem: Nutrition: ?Goal: Adequate nutrition will be maintained ?Outcome: Progressing ?  ?Problem: Coping: ?Goal: Level of anxiety will decrease ?Outcome: Progressing ?  ?Problem: Elimination: ?Goal: Will not experience complications related to bowel motility ?Outcome: Progressing ?Goal: Will not experience complications related to urinary retention ?Outcome: Progressing ?  ?Problem: Pain  Managment: ?Goal: General experience of comfort will improve ?Outcome: Progressing ?  ?Problem: Safety: ?Goal: Ability to remain free from injury will improve ?Outcome: Progressing ?  ?Problem: Skin Integrity: ?Goal: Risk for impaired skin integrity will decrease ?Outcome: Progressing ?  ?

## 2022-04-25 NOTE — Progress Notes (Signed)
? ?  Subjective: ?1 Day Post-Op Procedure(s) (LRB): ?TOTAL HIP ARTHROPLASTY ANTERIOR APPROACH (Right) ?Patient reports pain as mild.   ?Patient is well, and has had no acute complaints or problems ?Denies any CP, SOB, ABD pain. ?We will continue therapy today.  ?Plan is to go Home after hospital stay. ? ?Objective: ?Vital signs in last 24 hours: ?Temp:  [97.5 ?F (36.4 ?C)-99.1 ?F (37.3 ?C)] 99.1 ?F (37.3 ?C) (04/26 0524) ?Pulse Rate:  [71-89] 85 (04/26 0524) ?Resp:  [16-19] 17 (04/26 0524) ?BP: (83-142)/(60-90) 142/78 (04/26 0524) ?SpO2:  [95 %-100 %] 95 % (04/26 0524) ?Weight:  [128.1 kg] 128.1 kg (04/25 1741) ? ?Intake/Output from previous day: ?04/25 0701 - 04/26 0700 ?In: 1830.4 [P.O.:240; I.V.:1390.4; IV Piggyback:200] ?Out: 1950 [Urine:1850; Blood:100] ?Intake/Output this shift: ?No intake/output data recorded. ? ?Recent Labs  ?  04/24/22 ?1121  ?HGB 15.5  ? ?Recent Labs  ?  04/24/22 ?1121  ?WBC 9.0  ?RBC 5.64  ?HCT 48.1  ?PLT 264  ? ?Recent Labs  ?  04/24/22 ?1121  ?CREATININE 0.88  ? ?No results for input(s): LABPT, INR in the last 72 hours. ? ?EXAM ?General - Patient is Alert, Appropriate, and Oriented ?Extremity - Neurovascular intact ?Sensation intact distally ?Intact pulses distally ?Dorsiflexion/Plantar flexion intact ?Dressing - dressing C/D/I and no drainage, Prevena intact without drainage ?Motor Function - intact, moving foot and toes well on exam.  ? ?Past Medical History:  ?Diagnosis Date  ? Adenomatous polyp of colon   ? 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 Atrial fibrillation ([place])   ? BPH (benign prostatic hyperplasia)   ? CHF (congestive heart failure) ([place])   ? COPD (chronic obstructive pulmonary disease) ([place])   ? Diabetes mellitus without complication ([place])   ? type 2  ? Discitis   ? Diverticulitis   ? Diverticulosis   ? DVT (deep venous thrombosis) ([place])   ? bilateral tibial  ? Erectile dysfunction   ? Hearing loss   ? Hyperlipidemia   ? Hypertension   ? Ischemic optic neuropathy   ? Lower GI bleeding   ?  Lumbar degenerative disc disease   ? Non-ischemic cardiomyopathy ([place])   ? Sleep apnea   ? ? ?Assessment/Plan:   ?1 Day Post-Op Procedure(s) (LRB): ?TOTAL HIP ARTHROPLASTY ANTERIOR APPROACH (Right) ?Principal Problem: ?  Status post total hip replacement, right ? ?Estimated body mass index is 34.36 kg/m? as calculated from the following: ?  Height as of this encounter: '6\' 4"'$  (1.93 m). ?  Weight as of this encounter: 128.1 kg. ?Advance diet ?Up with therapy ?Vital signs stable ?Encourage incentive spirometer ?Pain well controlled ?Labs pending this morning ?Care management to assist with discharge to home with home health PT.  Likely discharge to home today pending completion of PT goals ? ?DVT Prophylaxis - Lovenox, TED hose, and SCDs ?Weight-Bearing as tolerated to right leg ? ? ?T. Rachelle Hora, PA-C ?[place] ?04/25/2022, 7:26 AM ?  ?

## 2022-04-25 NOTE — Discharge Summary (Signed)
?Physician Discharge Summary  ?Patient ID: ?Evan [name] ?MRN: [id number] ?DOB/AGE: 78/09/1945 78 y.o. ? ?Admit date: 04/24/2022 ?Discharge date: 04/25/2022 ? ?Admission Diagnoses:  ?Status post total hip replacement, right [Z96.641] ? ? ?Discharge Diagnoses: ?Patient Active Problem List  ? Diagnosis Date Noted  ? Status post total hip replacement, right 04/24/2022  ? Iron deficiency anemia 03/07/2018  ? Chronic insomnia 02/27/2018  ? Diverticulosis of large intestine without diverticulitis   ? GIB (gastrointestinal bleeding) 02/23/2018  ? Near syncope 02/22/2018  ? Hematochezia 02/16/2018  ? Acute deep vein thrombosis (DVT) of popliteal vein of right lower extremity ([place]) 01/24/2017  ? Ischemic optic neuropathy of left eye 01/24/2017  ? DVT (deep venous thrombosis) ([place]) 01/22/2017  ? History of deep vein thrombosis (DVT) of lower extremity 01/22/2017  ? S/P partial colectomy 01/21/2017  ? Acute posthemorrhagic anemia 01/06/2017  ? Encounter for blood transfusion 01/06/2017  ? Leukocytosis 01/06/2017  ? Hypokalemia 01/06/2017  ? History of GI diverticular bleed   ? BRBPR (bright red blood per rectum) 12/27/2016  ? Stage 2 moderate COPD by GOLD classification ([place]) 05/03/2016  ? High risk medication use 05/04/2015  ? Sleep apnea 05/04/2015  ? 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 Hearing loss 11/07/2014  ? Hypercholesteremia 11/07/2014  ? Hypertension 11/07/2014  ? Lumbar degenerative disc disease 11/07/2014  ? 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 Arthrosis of knee 06/22/2014  ? Degenerative joint disease of knee, left 05/06/2014  ? Status post total right knee replacement 05/06/2014  ? Aortic atherosclerosis ([place]) 05/05/2010  ? History of adenomatous polyp of colon 01/31/2007  ? ? ?Past Medical History:  ?Diagnosis Date  ? Adenomatous polyp of colon   ? 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 Atrial fibrillation ([place])   ? BPH (benign prostatic  hyperplasia)   ? CHF (congestive heart failure) ([place])   ? COPD (chronic obstructive pulmonary disease) ([place])   ? Diabetes mellitus without complication ([place])   ? type 2  ? Discitis   ? Diverticulitis   ? Diverticulosis   ? DVT (deep venous thrombosis) ([place])   ? bilateral tibial  ? Erectile dysfunction   ? Hearing loss   ? Hyperlipidemia   ? Hypertension   ? Ischemic optic neuropathy   ? Lower GI bleeding   ? Lumbar degenerative disc disease   ? Non-ischemic cardiomyopathy ([place])   ? Sleep apnea   ? ?  ?Transfusion: none ?  ?Consultants (if any):  ? ?Discharged Condition: Improved ? ?Hospital Course: Evan [name] is an 77 y.o. male who was admitted 04/24/2022 with a diagnosis of Status post total hip replacement, right and went to the operating room on 04/24/2022 and underwent the above named procedures.  ?  ?Surgeries: Procedure(s): ?TOTAL HIP ARTHROPLASTY ANTERIOR APPROACH on 04/24/2022 ?Patient tolerated the surgery well. Taken to PACU where she was stabilized and then transferred to the orthopedic floor. ? ?Started on Lovenox 40 mg q 24 hrs. Foot pumps applied bilaterally at 80 mm. Heels elevated on bed with rolled towels. No evidence of DVT. Negative [place]. ?Physical therapy started on day #1 for gait training and transfer. OT started day #1 for ADL and assisted devices. ? ?Patient's foley was d/c on day #1. Patient's IV was d/c on day #1. ? ?On post op day #1 patient was stable and ready for discharge to home with HHPT. ? ? ? ?He was given perioperative antibiotics:  ?Anti-infectives (  From admission, onward)  ? ? Start     Dose/Rate Route Frequency Ordered Stop  ? 04/24/22 1130  ceFAZolin (ANCEF) IVPB 2g/100 mL premix       ? 2 g ?200 mL/hr over 30 Minutes Intravenous Every 6 hours 04/24/22 1037 04/24/22 1900  ? 04/24/22 0615  ceFAZolin (ANCEF) IVPB 3g/100 mL premix       ? 3 g ?200 mL/hr over 30 Minutes Intravenous On call to O.R. 04/24/22 5277 04/24/22 0750  ? 04/24/22 0609  ceFAZolin (ANCEF) 2-4 GM/100ML-%  IVPB       ?Note to Pharmacy: Trudie Reed S: cabinet override  ?    04/24/22 8242 04/24/22 1814  ? ?  ?. ? ?He was given sequential compression devices, early ambulation, and Lovenox, teds for DVT prophylaxis. ? ?He benefited maximally from the hospital stay and there were no complications.   ? ?Recent vital signs:  ?Vitals:  ? 04/25/22 0812 04/25/22 1144  ?BP: (!) 141/83 123/72  ?Pulse: 92 90  ?Resp: 16 18  ?Temp: 98 ?F (36.7 ?C) 98.2 ?F (36.8 ?C)  ?SpO2: 92% 92%  ? ? ?Recent laboratory studies:  ?Lab Results  ?Component Value Date  ? HGB 15.1 04/25/2022  ? HGB 15.5 04/24/2022  ? HGB 15.8 04/12/2022  ? ?Lab Results  ?Component Value Date  ? WBC 9.6 04/25/2022  ? PLT 252 04/25/2022  ? ?Lab Results  ?Component Value Date  ? INR 1.06 02/22/2018  ? ?Lab Results  ?Component Value Date  ? NA 133 (L) 04/25/2022  ? K 4.5 04/25/2022  ? CL 98 04/25/2022  ? CO2 29 04/25/2022  ? BUN 10 04/25/2022  ? CREATININE 0.83 04/25/2022  ? GLUCOSE 192 (H) 04/25/2022  ? ? ?Discharge Medications:   ?Allergies as of 04/25/2022   ? ?   Reactions  ? Azithromycin Rash  ? Penicillins Rash  ? Has patient had a PCN reaction causing immediate rash, facial/tongue/throat swelling, SOB or lightheadedness with hypotension: Yes ?Has patient had a PCN reaction causing severe rash involving mucus membranes or skin necrosis: Yes ?Has patient had a PCN reaction that required hospitalization No ?Has patient had a PCN reaction occurring within the last 10 years: No ?If all of the above answers are "NO", then may proceed with Cephalosporin use.  ? Lisinopril Cough  ? Losartan Cough  ? Metoprolol Other (See Comments)  ? Bradycardia.  ? ?  ? ?  ?Medication List  ?  ? ?STOP taking these medications   ? ?acetaminophen 500 MG tablet ?Commonly known as: TYLENOL ?  ? ?  ? ?TAKE these medications   ? ?amLODipine 10 MG tablet ?Commonly known as: NORVASC ?Take 10 mg by mouth daily. ?  ?aspirin 81 MG EC tablet ?Take 1 tablet (81 mg total) by mouth daily. ?  ?CENTRUM  SILVER PO ?Take 1 tablet by mouth every morning. ?  ?docusate sodium 100 MG capsule ?Commonly known as: COLACE ?Take 1 capsule (100 mg total) by mouth 2 (two) times daily. ?  ?enoxaparin 40 MG/0.4ML injection ?Commonly known as: LOVENOX ?Inject 0.4 mLs (40 mg total) into the skin daily for 14 days. ?  ?HYDROcodone-acetaminophen 7.5-325 MG tablet ?Commonly known as: NORCO ?Take 1-2 tablets by mouth every 4 (four) hours as needed for severe pain (pain score 7-10). ?  ?hydrOXYzine 25 MG tablet ?Commonly known as: ATARAX ?Take 25 mg by mouth at bedtime as needed (sleep). ?  ?metFORMIN 500 MG 24 hr tablet ?Commonly known as: GLUCOPHAGE-XR ?1,000 mg  daily with breakfast. ?  ?methocarbamol 500 MG tablet ?Commonly known as: ROBAXIN ?Take 1 tablet (500 mg total) by mouth every 6 (six) hours as needed for muscle spasms. ?  ?OVER THE COUNTER MEDICATION ?Apply 1 application. topically daily as needed (pain). PanOmatic pain relief cream ?  ?polyethylene glycol 17 g packet ?Commonly known as: MIRALAX / GLYCOLAX ?Take 17 g by mouth daily. ?What changed:  ?when to take this ?reasons to take this ?  ?pravastatin 40 MG tablet ?Commonly known as: PRAVACHOL ?Take 40 mg by mouth at bedtime. ?  ?SYSTANE OP ?Place 1 drop into both eyes daily as needed (irritation). ?  ?traMADol 50 MG tablet ?Commonly known as: ULTRAM ?Take 1 tablet (50 mg total) by mouth every 6 (six) hours as needed. ?  ?vitamin B-12 1000 MCG tablet ?Commonly known as: CYANOCOBALAMIN ?Take 1,000 mcg by mouth daily. ?  ?Vitamin D3 50 MCG (2000 UT) capsule ?Take 2,000 Units by mouth daily. ?  ? ?  ? ?  ?  ? ? ?  ?Durable Medical Equipment  ?(From admission, onward)  ?  ? ? ?  ? ?  Start     Ordered  ? 04/24/22 1038  DME Walker rolling  Once       ?Question Answer Comment  ?Walker: With 5 Inch Wheels   ?Patient needs a walker to treat with the following condition Status post total hip replacement, right   ?  ? 04/24/22 1037  ? 04/24/22 1038  DME 3 n 1  Once       ? 04/24/22  1037  ? 04/24/22 1038  DME Bedside commode  Once       ?Question:  Patient needs a bedside commode to treat with the following condition  Answer:  Status post total hip replacement, right  ? 04/24/22 1037  ? ?

## 2022-04-25 NOTE — Progress Notes (Signed)
Physical Therapy Treatment ?Patient Details ?Name: Evan [name] ?MRN: [id number] ?DOB: 11-03-1945 ?Today's Date: 04/25/2022 ? ? ?History of Present Illness Pt is a 77 yo male s/p R THA, WBAT with direct anterior approach. PMH of COPD, sleep apnea, HTN, afib, DMII, CHF, DVT, partial colectomy, IVC filter placed, R TKA. ? ?  ?PT Comments  ? ? Patient alert, agreeable to PT, denied much pain at rest, but with mobility and R hip weight bearing, stated 5/10 pain. He was able to perform several exercises during the session with improved muscle activation with repetition. The patient continued to demonstrate good progress towards goals. Bed mobility with supervision, cued for LLE to assist RLE. Sit <> stand twice with RW and supervision, cued once for hand placement, good follow through for remainder of session. He ambulated ~245f with RW and CGA, improved gait velocity, weight bearing, and confidence noted with time. Returned to room and up in recliner with all needs in reach. PT and pt reviewed car transfers as well as threshold step navigation, pt verbalized understanding. The patient would benefit from further skilled PT intervention to continue to progress towards goals. Recommendation remains appropriate.  ?   ?Recommendations for follow up therapy are one component of a multi-disciplinary discharge planning process, led by the attending physician.  Recommendations may be updated based on patient status, additional functional criteria and insurance authorization. ? ?Follow Up Recommendations ? Home health PT ?  ?  ?Assistance Recommended at Discharge Intermittent Supervision/Assistance  ?Patient can return home with the following A little help with bathing/dressing/bathroom;Assistance with cooking/housework;Assist for transportation;Help with stairs or ramp for entrance ?  ?Equipment Recommendations ? None recommended by PT  ?  ?Recommendations for Other Services   ? ? ?  ?Precautions / Restrictions  Precautions ?Precautions: Fall;Anterior Hip ?Precaution Booklet Issued: Yes (comment) ?Restrictions ?Weight Bearing Restrictions: Yes ?RLE Weight Bearing: Weight bearing as tolerated  ?  ? ?Mobility ? Bed Mobility ?Overal bed mobility: Needs Assistance ?Bed Mobility: Supine to Sit ?  ?  ?Supine to sit: Supervision, HOB elevated ?  ?  ?General bed mobility comments: use of bed rails, cued for LLE to assist RLE ?  ? ?Transfers ?Overall transfer level: Needs assistance ?Equipment used: Rolling walker (2 wheels) ?Transfers: Sit to/from Stand ?Sit to Stand: Supervision ?  ?  ?  ?  ?  ?General transfer comment: 1 reminder about hand placement ?  ? ?Ambulation/Gait ?Ambulation/Gait assistance: Min guard ?Gait Distance (Feet): 200 Feet ?Assistive device: Rolling walker (2 wheels) ?  ?  ?  ?  ?General Gait Details: with time pt gait velocity confidence and RLE weight bearing improved ? ? ?Stairs ?  ?  ?  ?  ?  ? ? ?Wheelchair Mobility ?  ? ?Modified Rankin (Stroke Patients Only) ?  ? ? ?  ?Balance Overall balance assessment: Needs assistance ?Sitting-balance support: Feet supported ?Sitting balance-Leahy Scale: Good ?  ?  ?  ?Standing balance-Leahy Scale: Fair ?  ?  ?  ?  ?  ?  ?  ?  ?  ?  ?  ?  ?  ? ?  ?Cognition Arousal/Alertness: Awake/alert ?Behavior During Therapy: [facility] tasks assessed/performed ?Overall Cognitive Status: Within Functional Limits for tasks assessed ?  ?  ?  ?  ?  ?  ?  ?  ?  ?  ?  ?  ?  ?  ?  ?  ?  ?  ?  ? ?  ?Exercises Total Joint  Exercises ?Ankle Circles/Pumps: AROM, Both, 10 reps ?Quad Sets: AROM, Strengthening, Right, 10 reps ?Short Arc Quad: AROM, Strengthening, Right, 20 reps ?Hip ABduction/ADduction: AROM, Right, 10 reps ?Long Arc Quad: AROM, AAROM, Strengthening, Right, 10 reps ? ?  ?General Comments   ?  ?  ? ?Pertinent Vitals/Pain Pain Assessment ?Pain Assessment: 0-10 ?Pain Score: 5  ?Pain Location: R hip with mobility, weight bearing ?Pain Descriptors / Indicators: Sore, Tightness ?Pain  Intervention(s): Monitored during session, Limited activity within patient's tolerance, Repositioned  ? ? ?Home Living Family/patient expects to be discharged to:: Private residence ?Living Arrangements: Spouse/significant other ?Available Help at Discharge: Family ?Type of Home: House ?Home Access: Stairs to enter ?Entrance Stairs-Rails: None ?Entrance Stairs-Number of Steps: 1 ?  ?Home Layout: One level ?Home Equipment: Conservation officer, nature (2 wheels);Cane - single point;Crutches;BSC/3in1;Other (comment);Adaptive equipment ?Additional Comments: toilet rails  ?  ?Prior Function    ?  ?  ?   ? ?PT Goals (current goals can now be found in the care plan section) Progress towards PT goals: Progressing toward goals ? ?  ?Frequency ? ? ? BID ? ? ? ?  ?PT Plan Current plan remains appropriate  ? ? ?Co-evaluation   ?  ?  ?  ?  ? ?  ?AM-PAC PT "6 Clicks" Mobility   ?Outcome Measure ? Help needed turning from your back to your side while in a flat bed without using bedrails?: A Little ?Help needed moving from lying on your back to sitting on the side of a flat bed without using bedrails?: A Little ?Help needed moving to and from a bed to a chair (including a wheelchair)?: None ?Help needed standing up from a chair using your arms (e.g., wheelchair or bedside chair)?: None ?Help needed to walk in hospital room?: A Little ?Help needed climbing 3-5 steps with a railing? : A Little ?6 Click Score: 20 ? ?  ?End of Session Equipment Utilized During Treatment: Gait belt ?Activity Tolerance: Patient tolerated treatment well ?Patient left: in chair;with call bell/phone within reach ?Nurse Communication: Mobility status ?PT Visit Diagnosis: Other abnormalities of gait and mobility (R26.89);Difficulty in walking, not elsewhere classified (R26.2);Muscle weakness (generalized) (M62.81);Pain ?Pain - Right/Left: Right ?Pain - part of body: Hip ?  ? ? ?Time: 3716-9678 ?PT Time Calculation (min) (ACUTE ONLY): 30 min ? ?Charges:  $Therapeutic  Exercise: 8-22 mins ?$Therapeutic Activity: 8-22 mins          ?          ? ?Lieutenant Diego PT, DPT ?10:48 AM,04/25/22 ? ? ?

## 2022-04-25 NOTE — Discharge Instructions (Signed)
?ANTERIOR APPROACH TOTAL HIP REPLACEMENT POSTOPERATIVE DIRECTIONS ? ? ?Hip Rehabilitation, Guidelines Following Surgery  ?The results of a hip operation are greatly improved after range of motion and muscle strengthening exercises. Follow all safety measures which are given to protect your hip. If any of these exercises cause increased pain or swelling in your joint, decrease the amount until you are comfortable again. Then slowly increase the exercises. Call your caregiver if you have problems or questions.  ? ?HOME CARE INSTRUCTIONS  ?Remove items at home which could result in a fall. This includes throw rugs or furniture in walking pathways.  ?ICE to the affected hip every three hours for 30 minutes at a time and then as needed for pain and swelling.  Continue to use ice on the hip for pain and swelling from surgery. You may notice swelling that will progress down to the foot and ankle.  This is normal after surgery.  Elevate the leg when you are not up walking on it.   ?Continue to use the breathing machine which will help keep your temperature down.  It is common for your temperature to cycle up and down following surgery, especially at night when you are not up moving around and exerting yourself.  The breathing machine keeps your lungs expanded and your temperature down. ?Do not place pillow under knee, focus on keeping the knee straight while resting ? ?DIET ?You may resume your previous home diet once your are discharged from the hospital. ? ?DRESSING / WOUND CARE / SHOWERING ?Please remove provena negative pressure dressing on 05/02/2022 and apply honey comb dressing. Keep dressing clean and dry at all times.  ? ?ACTIVITY ?Walk with your walker as instructed. ?Use walker as long as suggested by your caregivers. ?Avoid periods of inactivity such as sitting longer than an hour when not asleep. This helps prevent blood clots.  ?You may resume a sexual relationship in one month or when given the OK by your  doctor.  ?You may return to work once you are cleared by your doctor.  ?Do not drive a car for 6 weeks or until released by you surgeon.  ?Do not drive while taking narcotics. ? ?WEIGHT BEARING ?Weight bearing as tolerated. Use walker/cane as needed for at least 4 weeks post op. ? ?POSTOPERATIVE CONSTIPATION PROTOCOL ?Constipation - defined medically as fewer than three stools per week and severe constipation as less than one stool per week. ? ?One of the most common issues patients have following surgery is constipation.  Even if you have a regular bowel pattern at home, your normal regimen is likely to be disrupted due to multiple reasons following surgery.  Combination of anesthesia, postoperative narcotics, change in appetite and fluid intake all can affect your bowels.  In order to avoid complications following surgery, here are some recommendations in order to help you during your recovery period. ? ?Colace (docusate) - Pick up an over-the-counter form of Colace or another stool softener and take twice a day as long as you are requiring postoperative pain medications.  Take with a full glass of water daily.  If you experience loose stools or diarrhea, hold the colace until you stool forms back up.  If your symptoms do not get better within 1 week or if they get worse, check with your doctor. ? ?Dulcolax (bisacodyl) - Pick up over-the-counter and take as directed by the product packaging as needed to assist with the movement of your bowels.  Take with a full glass of  water.  Use this product as needed if not relieved by Colace only.  ? ?MiraLax (polyethylene glycol) - Pick up over-the-counter to have on hand.  MiraLax is a solution that will increase the amount of water in your bowels to assist with bowel movements.  Take as directed and can mix with a glass of water, juice, soda, coffee, or tea.  Take if you go more than two days without a movement. ?Do not use MiraLax more than once per day. Call your doctor  if you are still constipated or irregular after using this medication for 7 days in a row. ? ?If you continue to have problems with postoperative constipation, please contact the office for further assistance and recommendations.  If you experience "the worst abdominal pain ever" or develop nausea or vomiting, please contact the office immediatly for further recommendations for treatment. ? ?ITCHING ? If you experience itching with your medications, try taking only a single pain pill, or even half a pain pill at a time.  You can also use Benadryl over the counter for itching or also to help with sleep.  ? ?TED HOSE STOCKINGS ?Wear the elastic stockings on both legs for six weeks following surgery during the day but you may remove then at night for sleeping. ? ?MEDICATIONS ?See your medication summary on the ?After Visit Summary? that the nursing staff will review with you prior to discharge.  You may have some home medications which will be placed on hold until you complete the course of blood thinner medication.  It is important for you to complete the blood thinner medication as prescribed by your surgeon.  Continue your approved medications as instructed at time of discharge. ? ?PRECAUTIONS ?If you experience chest pain or shortness of breath - call 911 immediately for transfer to the hospital emergency department.  ?If you develop a fever greater that 101 F, purulent drainage from wound, increased redness or drainage from wound, foul odor from the wound/dressing, or calf pain - CONTACT YOUR SURGEON.   ?                                                ?FOLLOW-UP APPOINTMENTS ?Make sure you keep all of your appointments after your operation with your surgeon and caregivers. You should call the office at the above phone number and make an appointment for approximately two weeks after the date of your surgery or on the date instructed by your surgeon outlined in the "After Visit Summary". ? ?RANGE OF MOTION AND  STRENGTHENING EXERCISES  ?These exercises are designed to help you keep full movement of your hip joint. Follow your caregiver's or physical therapist's instructions. Perform all exercises about fifteen times, three times per day or as directed. Exercise both hips, even if you have had only one joint replacement. These exercises can be done on a training (exercise) mat, on the floor, on a table or on a bed. Use whatever works the best and is most comfortable for you. Use music or television while you are exercising so that the exercises are a pleasant break in your day. This will make your life better with the exercises acting as a break in routine you can look forward to.  ?Lying on your back, slowly slide your foot toward your buttocks, raising your knee up off the floor. Then slowly slide your  foot back down until your leg is straight again.  ?Lying on your back spread your legs as far apart as you can without causing discomfort.  ?Lying on your side, raise your upper leg and foot straight up from the floor as far as is comfortable. Slowly lower the leg and repeat.  ?Lying on your back, tighten up the muscle in the front of your thigh (quadriceps muscles). You can do this by keeping your leg straight and trying to raise your heel off the floor. This helps strengthen the largest muscle supporting your knee.  ?Lying on your back, tighten up the muscles of your buttocks both with the legs straight and with the knee bent at a comfortable angle while keeping your heel on the floor.  ? ?IF YOU ARE TRANSFERRED TO A SKILLED REHAB FACILITY ?If the patient is transferred to a skilled rehab facility following release from the hospital, a list of the current medications will be sent to the facility for the patient to continue.  When discharged from the skilled rehab facility, please have the facility set up the patient's [place] prior to being released. Also, the skilled facility will be responsible for  providing the patient with their medications at time of release from the facility to include their pain medication, the muscle relaxants, and their blood thinner medication. If the patient is still at the [facility]

## 2022-04-25 NOTE — Evaluation (Signed)
Occupational Therapy Evaluation ?Patient Details ?Name: Evan [name] ?MRN: [id number] ?DOB: March 03, 1945 ?Today's Date: 04/25/2022 ? ? ?History of Present Illness Pt is a 77 yo male s/p R THA, WBAT with direct anterior approach. PMH of COPD, sleep apnea, HTN, afib, DMII, CHF, DVT, partial colectomy, IVC filter placed, R TKA.  ? ?Clinical Impression ?  ?Pt seen for OT evaluation this date, POD#1 from above surgery. Pt was independent in all ADL and active going to the gym prior to surgery, however occasionally using SPC for mobility due to R hip pain. Pt is eager to return to PLOF with less pain and improved safety and independence. Pt currently requires PRN minimal assist for LB dressing and bathing while in seated position due to pain and limited AROM of R hip. Pt instructed in home/routines modifications, falls prevention, AE/DME for LB ADL, and RW mgt in kitchen versus bathroom to support safe ADL/IADL participation. Handout provided to support recall and carryover. Pt would benefit from additional instruction in self care skills and techniques to help maintain precautions with or without assistive devices to support recall and carryover prior to discharge. All education/training provided at time of evaluation. No additional skilled OT needs at this time. Will sign off.     ? ?Recommendations for follow up therapy are one component of a multi-disciplinary discharge planning process, led by the attending physician.  Recommendations may be updated based on patient status, additional functional criteria and insurance authorization.  ? ?Follow Up Recommendations ? No OT follow up  ?  ?Assistance Recommended at Discharge PRN  ?Patient can return home with the following A little help with bathing/dressing/bathroom;Assistance with cooking/housework;Assist for transportation;Help with stairs or ramp for entrance ? ?  ?Functional Status Assessment ? Patient has had a recent decline in their functional status and  demonstrates the ability to make significant improvements in function in a reasonable and predictable amount of time.  ?Equipment Recommendations ? None recommended by OT  ?  ?Recommendations for Other Services   ? ? ?  ?Precautions / Restrictions Precautions ?Precautions: Fall ?Restrictions ?Weight Bearing Restrictions: Yes ?RLE Weight Bearing: Weight bearing as tolerated  ? ?  ? ?Mobility Bed Mobility ?  ?  ?  ?  ?  ?  ?  ?General bed mobility comments: pt declined EOB/OOB, requesting to finish breakfast ?  ? ?Transfers ?  ?  ?  ?  ?  ?  ?  ?  ?  ?General transfer comment: pt declined EOB/OOB, requesting to finish breakfast ?  ? ?  ?Balance   ?  ?  ?  ?  ?  ?  ?  ?  ?  ?  ?  ?  ?  ?  ?  ?  ?  ?  ?   ? ?ADL either performed or assessed with clinical judgement  ? ?ADL Overall ADL's : Needs assistance/impaired ?  ?  ?  ?  ?  ?  ?  ?  ?  ?  ?  ?  ?  ?  ?  ?  ?  ?  ?  ?General ADL Comments: Pt currently requires PRN MIN A for LB ADL tasks 2/2 decr strength/ROM in R hip. Pt reports spouse able to assist.  ? ? ? ?Vision   ?   ?   ?Perception   ?  ?Praxis   ?  ? ?Pertinent Vitals/Pain Pain Assessment ?Pain Assessment: No/denies pain  ? ? ? ?Hand Dominance   ?  ?  Extremity/Trunk Assessment Upper Extremity Assessment ?Upper Extremity Assessment: Overall WFL for tasks assessed ?  ?Lower Extremity Assessment ?Lower Extremity Assessment: RLE deficits/detail ?RLE Deficits / Details: expected post-op strength/ROM deficits but functionally not limiting ?  ?Cervical / Trunk Assessment ?Cervical / Trunk Assessment: Normal ?  ?Communication Communication ?Communication: No difficulties;HOH ?  ?Cognition Arousal/Alertness: Awake/alert ?Behavior During Therapy: [facility] for tasks assessed/performed ?Overall Cognitive Status: Within Functional Limits for tasks assessed ?  ?  ?  ?  ?  ?  ?  ?  ?  ?  ?  ?  ?  ?  ?  ?  ?  ?  ?  ?General Comments    ? ?  ?Exercises Other Exercises ?Other Exercises: Pt instructed in home/routines modifications,  falls prevention, AE/DME for LB ADL, and RW mgt in kitchen versus bathroom to support safe ADL/IADL participation. Handout provided to support recall and carryover. ?  ?Shoulder Instructions    ? ? ?Home Living Family/patient expects to be discharged to:: Private residence ?Living Arrangements: Spouse/significant other ?Available Help at Discharge: Family ?Type of Home: House ?Home Access: Stairs to enter ?Entrance Stairs-Number of Steps: 1 ?Entrance Stairs-Rails: None ?Home Layout: One level ?  ?  ?Bathroom Shower/Tub: Walk-in shower ?  ?Bathroom Toilet: Standard ?  ?  ?Home Equipment: Conservation officer, nature (2 wheels);Cane - single point;Crutches;BSC/3in1;Other (comment);Adaptive equipment ?Adaptive Equipment: Reacher ?Additional Comments: toilet rails ?  ? ?  ?Prior Functioning/Environment Prior Level of Function : Independent/Modified Independent;Driving ?  ?  ?  ?  ?  ?  ?  ?ADLs Comments: goes to the gym, retired Curator ?  ? ?  ?  ?OT Problem List: Decreased strength;Decreased range of motion ?  ?   ?OT Treatment/Interventions:    ?  ?OT Goals(Current goals can be found in the care plan section) Acute Rehab OT Goals ?Patient Stated Goal: go home ?OT Goal Formulation: All assessment and education complete, DC therapy  ?OT Frequency:   ?  ? ?Co-evaluation   ?  ?  ?  ?  ? ?  ?AM-PAC OT "6 Clicks" Daily Activity     ?Outcome Measure Help from another person eating meals?: None ?Help from another person taking care of personal grooming?: None ?Help from another person toileting, which includes using toliet, bedpan, or urinal?: None ?Help from another person bathing (including washing, rinsing, drying)?: A Little ?Help from another person to put on and taking off regular upper body clothing?: None ?Help from another person to put on and taking off regular lower body clothing?: A Little ?6 Click Score: 22 ?  ?End of Session   ? ?Activity Tolerance: Patient tolerated treatment well ?Patient left: in bed;with call  bell/phone within reach ? ?OT Visit Diagnosis: Other abnormalities of gait and mobility (R26.89)  ?              ?Time: [phone] ?OT Time Calculation (min): 13 min ?Charges:  OT General Charges ?$OT Visit: 1 Visit ?OT Evaluation ?$OT Eval Low Complexity: 1 Low ?OT Treatments ?$Self Care/Home Management : 8-22 mins ? ?Ardeth Perfect., MPH, MS, OTR/L ?ascom [phone] ?04/25/22, 8:49 AM ? ?

## 2022-04-30 NOTE — Anesthesia Postprocedure Evaluation (Signed)
Anesthesia Post Note ? ?Patient: Evan [name] ? ?Procedure(s) Performed: TOTAL HIP ARTHROPLASTY ANTERIOR APPROACH (Right: Hip) ? ?Patient location during evaluation: PACU ?Anesthesia Type: General ?Level of consciousness: awake and alert ?Pain management: pain level controlled ?Vital Signs Assessment: post-procedure vital signs reviewed and stable ?Respiratory status: spontaneous breathing, nonlabored ventilation, respiratory function stable and patient connected to nasal cannula oxygen ?Cardiovascular status: blood pressure returned to baseline and stable ?Postop Assessment: no apparent nausea or vomiting ?Anesthetic complications: no ? ? ?No notable events documented. ? ? ?Last Vitals:  ?Vitals:  ? 04/25/22 0812 04/25/22 1144  ?BP: (!) 141/83 123/72  ?Pulse: 92 90  ?Resp: 16 18  ?Temp: 36.7 ?C 36.8 ?C  ?SpO2: 92% 92%  ?  ?Last Pain:  ?Vitals:  ? 04/25/22 1144  ?TempSrc: Oral  ?PainSc:   ? ? ?  ?  ?  ?  ?  ?  ? ?[name] ? ? ? ? ?

## 2022-06-21 ENCOUNTER — Encounter (INDEPENDENT_AMBULATORY_CARE_PROVIDER_SITE_OTHER): Payer: Self-pay | Admitting: Vascular Surgery

## 2022-06-21 ENCOUNTER — Ambulatory Visit (INDEPENDENT_AMBULATORY_CARE_PROVIDER_SITE_OTHER): Payer: Medicare HMO | Admitting: Vascular Surgery

## 2022-06-21 VITALS — BP 152/90 | HR 90 | Resp 16 | Wt 278.4 lb

## 2022-06-21 DIAGNOSIS — I10 Essential (primary) hypertension: Secondary | ICD-10-CM

## 2022-06-21 DIAGNOSIS — J449 Chronic obstructive pulmonary disease, unspecified: Secondary | ICD-10-CM

## 2022-06-21 DIAGNOSIS — Z461 Encounter for fitting and adjustment of hearing aid: Secondary | ICD-10-CM | POA: Insufficient documentation

## 2022-06-21 DIAGNOSIS — E7800 Pure hypercholesterolemia, unspecified: Secondary | ICD-10-CM

## 2022-06-21 DIAGNOSIS — I82543 Chronic embolism and thrombosis of tibial vein, bilateral: Secondary | ICD-10-CM

## 2022-06-21 DIAGNOSIS — E1121 Type 2 diabetes mellitus with diabetic nephropathy: Secondary | ICD-10-CM | POA: Diagnosis not present

## 2022-06-21 DIAGNOSIS — H903 Sensorineural hearing loss, bilateral: Secondary | ICD-10-CM | POA: Insufficient documentation

## 2022-06-21 NOTE — Progress Notes (Unsigned)
MRN : [id number]  Evan [name] is a 77 y.o. (1945/09/22) male who presents with chief complaint of legs hurt and swell.  History of Present Illness: ***  Current Meds  Medication Sig   acetaminophen (TYLENOL) 500 MG tablet Take 1,000 mg by mouth every 8 (eight) hours as needed.   amLODipine (NORVASC) 10 MG tablet Take 10 mg by mouth daily.   aspirin EC 81 MG EC tablet Take 1 tablet (81 mg total) by mouth daily.   Cholecalciferol (VITAMIN D3) 2000 units capsule Take 2,000 Units by mouth daily.   metFORMIN (GLUCOPHAGE-XR) 500 MG 24 hr tablet 1,000 mg daily with breakfast.    Multiple Vitamins-Minerals (CENTRUM SILVER PO) Take 1 tablet by mouth every morning.   Polyethyl Glycol-Propyl Glycol (SYSTANE OP) Place 1 drop into both eyes daily as needed (irritation).   polyethylene glycol (MIRALAX / GLYCOLAX) packet Take 17 g by mouth daily. (Patient taking differently: Take 17 g by mouth daily as needed for mild constipation.)   pravastatin (PRAVACHOL) 40 MG tablet Take 40 mg by mouth at bedtime.   traZODone (DESYREL) 50 MG tablet Take 50 mg by mouth at bedtime.   vitamin B-12 (CYANOCOBALAMIN) 1000 MCG tablet Take 1,000 mcg by mouth daily.    Past Medical History:  Diagnosis Date   Adenomatous polyp of colon    Arthritis    xDJD   Atrial fibrillation (HCC)    BPH (benign prostatic hyperplasia)    CHF (congestive heart failure) (HCC)    COPD (chronic obstructive pulmonary disease) (HCC)    Diabetes mellitus without complication ([place])    type 2   Discitis    Diverticulitis    Diverticulosis    DVT (deep venous thrombosis) (HCC)    bilateral tibial   Erectile dysfunction    Hearing loss    Hyperlipidemia    Hypertension    Ischemic optic neuropathy    Lower GI bleeding    Lumbar degenerative disc disease    Non-ischemic cardiomyopathy ([place])    Sleep apnea     Past Surgical History:  Procedure Laterality Date   ATRIAL ABLATION SURGERY  2010   CATARACT  EXTRACTION W/PHACO Right 10/10/2020   Procedure: CATARACT EXTRACTION PHACO AND INTRAOCULAR LENS PLACEMENT (IOC) RIGHT ISTENT INJ DIABETIC 2.64  00:29.4;  Surgeon: Eulogio Bear, MD;  Location: [place];  Service: Ophthalmology;  Laterality: Right;  Diabetic - oral meds   CATARACT EXTRACTION W/PHACO Left 03/06/2021   Procedure: CATARACT EXTRACTION PHACO AND INTRAOCULAR LENS PLACEMENT (IOC) LEFT DIABETIC ISTENT INJ 3.62 00:30.6;  Surgeon: Eulogio Bear, MD;  Location: [place];  Service: Ophthalmology;  Laterality: Left;  Diabetic - oral meds   CHOLECYSTECTOMY  2011   COLONOSCOPY Left 02/24/2018   Procedure: COLONOSCOPY;  Surgeon: Virgel Manifold, MD;  Location: [facility] ENDOSCOPY;  Service: Endoscopy;  Laterality: Left;   COLONOSCOPY     2008, 2010, 2013   COLONOSCOPY WITH PROPOFOL N/A 09/25/2017   Procedure: COLONOSCOPY WITH PROPOFOL;  Surgeon: Manya Silvas, MD;  Location: [facility] ENDOSCOPY;  Service: Endoscopy;  Laterality: N/A;   COLONOSCOPY WITH PROPOFOL N/A 02/18/2018   Procedure: COLONOSCOPY WITH PROPOFOL;  Surgeon: [name], MD;  Location: [facility] ENDOSCOPY;  Service: Endoscopy;  Laterality: N/A;   ERCP  2011   ESOPHAGOGASTRODUODENOSCOPY Left 02/23/2018   Procedure: ESOPHAGOGASTRODUODENOSCOPY (EGD);  Surgeon: Virgel Manifold, MD;  Location: [facility] ENDOSCOPY;  Service: Endoscopy;  Laterality: Left;   ESOPHAGOGASTRODUODENOSCOPY  2014   IVC FILTER INSERTION N/A 04/17/2022   Procedure: IVC FILTER INSERTION;  Surgeon: Katha Cabal, MD;  Location: [place] [facility];  Service: Cardiovascular;  Laterality: N/A;   LAPAROTOMY  01/01/2017   Procedure: EXPLORATORY LAPAROTOMY;  Surgeon: Olean Ree, MD;  Location: [facility] ORS;  Service: General;;   PARTIAL COLECTOMY N/A 01/01/2017   Procedure: PARTIAL COLECTOMY;  Surgeon: Olean Ree, MD;  Location: [facility];  Service: General;  Laterality: N/A;   PERIPHERAL VASCULAR CATHETERIZATION N/A 12/28/2016    Procedure: Visceral Artery Intervention;  Surgeon: Algernon Huxley, MD;  Location: [place] [facility];  Service: Cardiovascular;  Laterality: N/A;   SECONDARY CLOSURE OF WOUND N/A 01/08/2017   Procedure: SECONDARY CLOSURE FOR EVISCERATION;  Surgeon: Florene Glen, MD;  Location: [facility];  Service: General;  Laterality: N/A;   SIGMOIDECTOMY  2018   TOTAL HIP ARTHROPLASTY Right 04/24/2022   Procedure: TOTAL HIP ARTHROPLASTY ANTERIOR APPROACH;  Surgeon: Hessie Knows, MD;  Location: [facility] OR[facility];  Service: Orthopedics;  Laterality: Right;   TOTAL KNEE ARTHROPLASTY Right 03/24/2014   times 4    Social History Social History   Tobacco Use   Smoking status: Former    Packs/day: 0.25    Years: 29.00    Total pack years: 7.25    Types: Cigarettes    Quit date: 12/31/1993    Years since quitting: 28.4   Smokeless tobacco: Never  Vaping Use   Vaping Use: Never used  Substance Use Topics   Alcohol use: No    Alcohol/week: 0.0 standard drinks of alcohol   Drug use: No    Family History Family History  Problem Relation Age of Onset   Diabetes Sister    Ovarian cancer Sister    Diabetes Sister    Diabetes Brother    Heart attack Brother    Diabetes Brother     Allergies  Allergen Reactions   Azithromycin Rash   Penicillins Rash    Has patient had a PCN reaction causing immediate rash, facial/tongue/throat swelling, SOB or lightheadedness with hypotension: Yes Has patient had a PCN reaction causing severe rash involving mucus membranes or skin necrosis: Yes Has patient had a PCN reaction that required hospitalization No Has patient had a PCN reaction occurring within the last 10 years: No If all of the above answers are "NO", then may proceed with Cephalosporin use.      Lisinopril Cough   Losartan Cough   Metoprolol Other (See Comments)    Bradycardia.     REVIEW OF SYSTEMS (Negative unless checked)  Constitutional: '[]'$ Weight loss  '[]'$ Fever  '[]'$ Chills Cardiac: '[]'$ Chest pain    '[]'$ Chest pressure   '[]'$ Palpitations   '[]'$ Shortness of breath when laying flat   '[]'$ Shortness of breath with exertion. Vascular:  '[]'$ Pain in legs with walking   '[x]'$ Pain in legs at rest  '[]'$ History of DVT   '[]'$ Phlebitis   '[x]'$ Swelling in legs   '[]'$ Varicose veins   '[]'$ Non-healing ulcers Pulmonary:   '[]'$ Uses home oxygen   '[]'$ Productive cough   '[]'$ Hemoptysis   '[]'$ Wheeze  '[]'$ COPD   '[]'$ Asthma Neurologic:  '[]'$ Dizziness   '[]'$ Seizures   '[]'$ History of stroke   '[]'$ History of TIA  '[]'$ Aphasia   '[]'$ Vissual changes   '[]'$ Weakness or numbness in arm   '[]'$ Weakness or numbness in leg Musculoskeletal:   '[]'$ Joint swelling   '[]'$ Joint pain   '[]'$ Low back pain Hematologic:  '[]'$ Easy bruising  '[]'$ Easy bleeding   '[]'$ Hypercoagulable state   '[]'$ Anemic  Gastrointestinal:  '[]'$ Diarrhea   '[]'$ Vomiting  '[]'$ Gastroesophageal reflux/heartburn   '[]'$ Difficulty swallowing. Genitourinary:  '[]'$ Chronic kidney disease   '[]'$ Difficult urination  '[]'$ Frequent urination   '[]'$ Blood in urine Skin:  '[]'$ Rashes   '[]'$ Ulcers  Psychological:  '[]'$ History of anxiety   '[]'$  History of major depression.  Physical Examination  Vitals:   06/21/22 1345  BP: (!) 152/90  Pulse: 90  Resp: 16  Weight: 278 lb 6.4 oz (126.3 kg)   Body mass index is 33.89 kg/m. Gen: WD/WN, NAD Head: [place]/AT, No temporalis wasting.  Ear/Nose/Throat: Hearing grossly intact, nares w/o erythema or drainage, pinna without lesions Eyes: PER, EOMI, sclera nonicteric.  Neck: Supple, no gross masses.  No JVD.  Pulmonary:  Good air movement, no audible wheezing, no use of accessory muscles.  Cardiac: RRR, precordium not hyperdynamic. Vascular:  scattered varicosities present bilaterally.  Moderate venous stasis changes to the legs bilaterally.  2+ soft pitting edema  Vessel Right Left  Radial Palpable Palpable  Gastrointestinal: soft, non-distended. No guarding/no peritoneal signs.  Musculoskeletal: M/S 5/5 throughout.  No deformity.  Neurologic: CN 2-12 intact. Pain and light touch intact in extremities.  Symmetrical.  Speech  is fluent. Motor exam as listed above. Psychiatric: Judgment intact, Mood & affect appropriate for pt's clinical situation. Dermatologic: Venous rashes no ulcers noted.  No changes consistent with cellulitis. Lymph : No lichenification or skin changes of chronic lymphedema.  CBC Lab Results  Component Value Date   WBC 9.6 04/25/2022   HGB 15.1 04/25/2022   HCT 47.2 04/25/2022   MCV 85.2 04/25/2022   PLT 252 04/25/2022    BMET    Component Value Date/Time   NA 133 (L) 04/25/2022 0911   NA 136 03/26/2014 0418   K 4.5 04/25/2022 0911   K 4.0 03/26/2014 0418   CL 98 04/25/2022 0911   CL 102 03/26/2014 0418   CO2 29 04/25/2022 0911   CO2 34 (H) 03/26/2014 0418   GLUCOSE 192 (H) 04/25/2022 0911   GLUCOSE 114 (H) 03/26/2014 0418   BUN 10 04/25/2022 0911   BUN 8 03/26/2014 0418   CREATININE 0.83 04/25/2022 0911   CREATININE 0.90 03/26/2014 0418   CALCIUM 8.8 (L) 04/25/2022 0911   CALCIUM 9.2 03/26/2014 0418   GFRNONAA >60 04/25/2022 0911   GFRNONAA >60 03/26/2014 0418   GFRAA >60 06/07/2019 1326   GFRAA >60 03/26/2014 0418   CrCl cannot be calculated (Patient's most recent lab result is older than the maximum 21 days allowed.).  COAG Lab Results  Component Value Date   INR 1.06 02/22/2018   INR 1.11 01/08/2017   INR 1.18 01/01/2017    Radiology No results found.   Assessment/Plan There are no diagnoses linked to this encounter.   Hortencia Pilar, MD  06/21/2022 1:50 PM

## 2022-06-24 ENCOUNTER — Encounter (INDEPENDENT_AMBULATORY_CARE_PROVIDER_SITE_OTHER): Payer: Self-pay | Admitting: Vascular Surgery

## 2022-07-22 NOTE — Progress Notes (Signed)
MRN : [id number]  Evan [name] is a 77 y.o. (30-Dec-1945) male who presents with chief complaint of legs hurt and swell.  History of Present Illness:   The patient presents to the office for evaluation of past DVT in association with DJD requiring joint replacement surgery.  DVT was identified years ago and was treated with anticoagulation.  The presenting symptoms were pain and swelling in the lower extremity.  The patient notes the leg continues to be mildly painful with dependency and still swells with long periods of dependency.  Symptoms are much better with elevation.  The patient notes minimal edema in the morning which steadily worsens throughout the day.    The patient has not been using compression therapy at this point.  No SOB or pleuritic chest pains.  No cough or hemoptysis.  No blood per rectum or blood in any sputum.  No excessive bruising per the patient.    No recent shortening of the patient's walking distance or new symptoms consistent with claudication.  No history of rest pain symptoms. No new ulcers or wounds of the lower extremities have occurred.  The patient denies amaurosis fugax or recent TIA symptoms. There are no recent neurological changes noted. No recent episodes of angina or shortness of breath documented.    No outpatient medications have been marked as taking for the 07/23/22 encounter (Appointment) with Delana Meyer, Dolores Lory, MD.    Past Medical History:  Diagnosis Date   Adenomatous polyp of colon    Arthritis    xDJD   Atrial fibrillation ([place])    BPH (benign prostatic hyperplasia)    CHF (congestive heart failure) ([place])    COPD (chronic obstructive pulmonary disease) ([place])    Diabetes mellitus without complication ([place])    type 2   Discitis    Diverticulitis    Diverticulosis    DVT (deep venous thrombosis) ([place])    bilateral tibial   Erectile dysfunction    Hearing loss    Hyperlipidemia    Hypertension    Ischemic  optic neuropathy    Lower GI bleeding    Lumbar degenerative disc disease    Non-ischemic cardiomyopathy ([place])    Sleep apnea     Past Surgical History:  Procedure Laterality Date   ATRIAL ABLATION SURGERY  2010   CATARACT EXTRACTION W/PHACO Right 10/10/2020   Procedure: CATARACT EXTRACTION PHACO AND INTRAOCULAR LENS PLACEMENT (IOC) RIGHT ISTENT INJ DIABETIC 2.64  00:29.4;  Surgeon: Eulogio Bear, MD;  Location: [place];  Service: Ophthalmology;  Laterality: Right;  Diabetic - oral meds   CATARACT EXTRACTION W/PHACO Left 03/06/2021   Procedure: CATARACT EXTRACTION PHACO AND INTRAOCULAR LENS PLACEMENT (IOC) LEFT DIABETIC ISTENT INJ 3.62 00:30.6;  Surgeon: Eulogio Bear, MD;  Location: [place];  Service: Ophthalmology;  Laterality: Left;  Diabetic - oral meds   CHOLECYSTECTOMY  2011   COLONOSCOPY Left 02/24/2018   Procedure: COLONOSCOPY;  Surgeon: Virgel Manifold, MD;  Location: [facility] ENDOSCOPY;  Service: Endoscopy;  Laterality: Left;   COLONOSCOPY     2008, 2010, 2013   COLONOSCOPY WITH PROPOFOL N/A 09/25/2017   Procedure: COLONOSCOPY WITH PROPOFOL;  Surgeon: Manya Silvas, MD;  Location: [facility] ENDOSCOPY;  Service: Endoscopy;  Laterality: N/A;   COLONOSCOPY WITH PROPOFOL N/A 02/18/2018   Procedure: COLONOSCOPY WITH PROPOFOL;  Surgeon: [name], MD;  Location: [facility] ENDOSCOPY;  Service: Endoscopy;  Laterality: N/A;   ERCP  2011   ESOPHAGOGASTRODUODENOSCOPY Left 02/23/2018   Procedure: ESOPHAGOGASTRODUODENOSCOPY (EGD);  Surgeon: Virgel Manifold, MD;  Location: [facility] ENDOSCOPY;  Service: Endoscopy;  Laterality: Left;   ESOPHAGOGASTRODUODENOSCOPY  2014   IVC FILTER INSERTION N/A 04/17/2022   Procedure: IVC FILTER INSERTION;  Surgeon: Katha Cabal, MD;  Location: [place] [facility];  Service: Cardiovascular;  Laterality: N/A;   LAPAROTOMY  01/01/2017   Procedure: EXPLORATORY LAPAROTOMY;  Surgeon: Olean Ree, MD;  Location: [facility];   Service: General;;   PARTIAL COLECTOMY N/A 01/01/2017   Procedure: PARTIAL COLECTOMY;  Surgeon: Olean Ree, MD;  Location: [facility];  Service: General;  Laterality: N/A;   PERIPHERAL VASCULAR CATHETERIZATION N/A 12/28/2016   Procedure: Visceral Artery Intervention;  Surgeon: Algernon Huxley, MD;  Location: [place] [facility];  Service: Cardiovascular;  Laterality: N/A;   SECONDARY CLOSURE OF WOUND N/A 01/08/2017   Procedure: SECONDARY CLOSURE FOR EVISCERATION;  Surgeon: Florene Glen, MD;  Location: [facility];  Service: General;  Laterality: N/A;   SIGMOIDECTOMY  2018   TOTAL HIP ARTHROPLASTY Right 04/24/2022   Procedure: TOTAL HIP ARTHROPLASTY ANTERIOR APPROACH;  Surgeon: Hessie Knows, MD;  Location: [facility] OR[facility];  Service: Orthopedics;  Laterality: Right;   TOTAL KNEE ARTHROPLASTY Right 03/24/2014   times 4    Social History Social History   Tobacco Use   Smoking status: Former    Packs/day: 0.25    Years: 29.00    Total pack years: 7.25    Types: Cigarettes    Quit date: 12/31/1993    Years since quitting: 28.5   Smokeless tobacco: Never  Vaping Use   Vaping Use: Never used  Substance Use Topics   Alcohol use: No    Alcohol/week: 0.0 standard drinks of alcohol   Drug use: No    Family History Family History  Problem Relation Age of Onset   Diabetes Sister    Ovarian cancer Sister    Diabetes Sister    Diabetes Brother    Heart attack Brother    Diabetes Brother     Allergies  Allergen Reactions   Azithromycin Rash   Penicillins Rash    Has patient had a PCN reaction causing immediate rash, facial/tongue/throat swelling, SOB or lightheadedness with hypotension: Yes Has patient had a PCN reaction causing severe rash involving mucus membranes or skin necrosis: Yes Has patient had a PCN reaction that required hospitalization No Has patient had a PCN reaction occurring within the last 10 years: No If all of the above answers are "NO", then may proceed with Cephalosporin  use.      Lisinopril Cough   Losartan Cough   Metoprolol Other (See Comments)    Bradycardia.     REVIEW OF SYSTEMS (Negative unless checked)  Constitutional: '[]'$ Weight loss  '[]'$ Fever  '[]'$ Chills Cardiac: '[]'$ Chest pain   '[]'$ Chest pressure   '[]'$ Palpitations   '[]'$ Shortness of breath when laying flat   '[]'$ Shortness of breath with exertion. Vascular:  '[]'$ Pain in legs with walking   '[x]'$ Pain in legs at rest  '[]'$ History of DVT   '[]'$ Phlebitis   '[x]'$ Swelling in legs   '[]'$ Varicose veins   '[]'$ Non-healing ulcers Pulmonary:   '[]'$ Uses home oxygen   '[]'$ Productive cough   '[]'$ Hemoptysis   '[]'$ Wheeze  '[]'$ COPD   '[]'$ Asthma Neurologic:  '[]'$ Dizziness   '[]'$ Seizures   '[]'$ History of stroke   '[]'$ History of TIA  '[]'$ Aphasia   '[]'$ Vissual changes   '[]'$ Weakness or numbness in arm   '[]'$ Weakness or numbness in leg Musculoskeletal:   '[]'$   Joint swelling   '[]'$ Joint pain   '[]'$ Low back pain Hematologic:  '[]'$ Easy bruising  '[]'$ Easy bleeding   '[]'$ Hypercoagulable state   '[]'$ Anemic Gastrointestinal:  '[]'$ Diarrhea   '[]'$ Vomiting  '[]'$ Gastroesophageal reflux/heartburn   '[]'$ Difficulty swallowing. Genitourinary:  '[]'$ Chronic kidney disease   '[]'$ Difficult urination  '[]'$ Frequent urination   '[]'$ Blood in urine Skin:  '[]'$ Rashes   '[]'$ Ulcers  Psychological:  '[]'$ History of anxiety   '[]'$  History of major depression.  Physical Examination  There were no vitals filed for this visit. There is no height or weight on file to calculate BMI. Gen: WD/WN, NAD Head: [place]/AT, No temporalis wasting.  Ear/Nose/Throat: Hearing grossly intact, nares w/o erythema or drainage, pinna without lesions Eyes: PER, EOMI, sclera nonicteric.  Neck: Supple, no gross masses.  No JVD.  Pulmonary:  Good air movement, no audible wheezing, no use of accessory muscles.  Cardiac: RRR, precordium not hyperdynamic. Vascular:  scattered varicosities present bilaterally.  Moderate venous stasis changes to the legs bilaterally.  2+ soft pitting edema  Vessel Right Left  Radial Palpable Palpable  Gastrointestinal: soft,  non-distended. No guarding/no peritoneal signs.  Musculoskeletal: M/S 5/5 throughout.  No deformity.  Neurologic: CN 2-12 intact. Pain and light touch intact in extremities.  Symmetrical.  Speech is fluent. Motor exam as listed above. Psychiatric: Judgment intact, Mood & affect appropriate for pt's clinical situation. Dermatologic: Venous rashes no ulcers noted.  No changes consistent with cellulitis. Lymph : No lichenification or skin changes of chronic lymphedema.  CBC Lab Results  Component Value Date   WBC 9.6 04/25/2022   HGB 15.1 04/25/2022   HCT 47.2 04/25/2022   MCV 85.2 04/25/2022   PLT 252 04/25/2022    BMET    Component Value Date/Time   NA 133 (L) 04/25/2022 0911   NA 136 03/26/2014 0418   K 4.5 04/25/2022 0911   K 4.0 03/26/2014 0418   CL 98 04/25/2022 0911   CL 102 03/26/2014 0418   CO2 29 04/25/2022 0911   CO2 34 (H) 03/26/2014 0418   GLUCOSE 192 (H) 04/25/2022 0911   GLUCOSE 114 (H) 03/26/2014 0418   BUN 10 04/25/2022 0911   BUN 8 03/26/2014 0418   CREATININE 0.83 04/25/2022 0911   CREATININE 0.90 03/26/2014 0418   CALCIUM 8.8 (L) 04/25/2022 0911   CALCIUM 9.2 03/26/2014 0418   GFRNONAA >60 04/25/2022 0911   GFRNONAA >60 03/26/2014 0418   GFRAA >60 06/07/2019 1326   GFRAA >60 03/26/2014 0418   CrCl cannot be calculated (Patient's most recent lab result is older than the maximum 21 days allowed.).  COAG Lab Results  Component Value Date   INR 1.06 02/22/2018   INR 1.11 01/08/2017   INR 1.18 01/01/2017    Radiology No results found.   Assessment/Plan 1. Osteoarthritis of left knee, unspecified osteoarthritis type Patient is s/p right hip replacement  2. 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  IVC filter should be removed.  Risk and benefits were reviewed the patient.  Indications for the procedure were reviewed.  All questions were answered, the patient agrees  to proceed.   Elevation was stressed, such as the use of a recliner.  I have discussed  DVT and post phlebitic changes such as swelling and why it  causes symptoms such as pain.  The patient should wear graduated compression stockings.  The compression should be worn on a daily basis. The patient should wear the stockings first  thing in the morning and removing them in the evening. The patient should not to sleep in the stockings.  In addition, behavioral modification including elevation during the day and avoidance of prolonged dependency will be initiated.    The patient will follow-up with me 2-3 months after the joint replacement surgery to discuss removal (this was also discussed today and the patient agrees with the plan to have the filter removed).   - VAS [place] LOWER EXTREMITY VENOUS (DVT)  3. Primary hypertension Continue antihypertensive medications as already ordered, these medications have been reviewed and there are no changes at this time.   4. 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  Hgb A1C to be monitored as already arranged by primary service   5. Hypercholesteremia Continue statin as ordered and reviewed, no changes at this time     Hortencia Pilar, MD  07/22/2022 2:13 PM

## 2022-07-22 NOTE — H&P (View-Only) (Signed)
MRN : [id number]  Evan [name] is a 77 y.o. (1945-07-31) male who presents with chief complaint of legs hurt and swell.  History of Present Illness:   The patient presents to the office for evaluation of past DVT in association with DJD requiring joint replacement surgery.  DVT was identified years ago and was treated with anticoagulation.  The presenting symptoms were pain and swelling in the lower extremity.  The patient notes the leg continues to be mildly painful with dependency and still swells with long periods of dependency.  Symptoms are much better with elevation.  The patient notes minimal edema in the morning which steadily worsens throughout the day.    The patient has not been using compression therapy at this point.  No SOB or pleuritic chest pains.  No cough or hemoptysis.  No blood per rectum or blood in any sputum.  No excessive bruising per the patient.    No recent shortening of the patient's walking distance or new symptoms consistent with claudication.  No history of rest pain symptoms. No new ulcers or wounds of the lower extremities have occurred.  The patient denies amaurosis fugax or recent TIA symptoms. There are no recent neurological changes noted. No recent episodes of angina or shortness of breath documented.    No outpatient medications have been marked as taking for the 07/23/22 encounter (Appointment) with Delana Meyer, Dolores Lory, MD.    Past Medical History:  Diagnosis Date   Adenomatous polyp of colon    Arthritis    xDJD   Atrial fibrillation ([place])    BPH (benign prostatic hyperplasia)    CHF (congestive heart failure) ([place])    COPD (chronic obstructive pulmonary disease) ([place])    Diabetes mellitus without complication ([place])    type 2   Discitis    Diverticulitis    Diverticulosis    DVT (deep venous thrombosis) ([place])    bilateral tibial   Erectile dysfunction    Hearing loss    Hyperlipidemia    Hypertension    Ischemic  optic neuropathy    Lower GI bleeding    Lumbar degenerative disc disease    Non-ischemic cardiomyopathy ([place])    Sleep apnea     Past Surgical History:  Procedure Laterality Date   ATRIAL ABLATION SURGERY  2010   CATARACT EXTRACTION W/PHACO Right 10/10/2020   Procedure: CATARACT EXTRACTION PHACO AND INTRAOCULAR LENS PLACEMENT (IOC) RIGHT ISTENT INJ DIABETIC 2.64  00:29.4;  Surgeon: Eulogio Bear, MD;  Location: [place];  Service: Ophthalmology;  Laterality: Right;  Diabetic - oral meds   CATARACT EXTRACTION W/PHACO Left 03/06/2021   Procedure: CATARACT EXTRACTION PHACO AND INTRAOCULAR LENS PLACEMENT (IOC) LEFT DIABETIC ISTENT INJ 3.62 00:30.6;  Surgeon: Eulogio Bear, MD;  Location: [place];  Service: Ophthalmology;  Laterality: Left;  Diabetic - oral meds   CHOLECYSTECTOMY  2011   COLONOSCOPY Left 02/24/2018   Procedure: COLONOSCOPY;  Surgeon: Virgel Manifold, MD;  Location: [facility] ENDOSCOPY;  Service: Endoscopy;  Laterality: Left;   COLONOSCOPY     2008, 2010, 2013   COLONOSCOPY WITH PROPOFOL N/A 09/25/2017   Procedure: COLONOSCOPY WITH PROPOFOL;  Surgeon: Manya Silvas, MD;  Location: [facility] END[facility]COPY;  Service: Endoscopy;  Laterality: N/A;   COLONOSCOPY WITH PROPOFOL N/A 02/18/2018   Procedure: COLONOSCOPY WITH PROPOFOL;  Surgeon: [name], MD;  Location: [facility] ENDOSCOPY;  Service: Endoscopy;  Laterality: N/A;   ERCP  2011   ESOPHAGOGASTRODUODENOSCOPY Left 02/23/2018   Procedure: ESOPHAGOGASTRODUODENOSCOPY (EGD);  Surgeon: Virgel Manifold, MD;  Location: [facility] ENDOSCOPY;  Service: Endoscopy;  Laterality: Left;   ESOPHAGOGASTRODUODENOSCOPY  2014   IVC FILTER INSERTION N/A 04/17/2022   Procedure: IVC FILTER INSERTION;  Surgeon: Katha Cabal, MD;  Location: [place] CV LAB;  Service: Cardiovascular;  Laterality: N/A;   LAPAROTOMY  01/01/2017   Procedure: EXPLORATORY LAPAROTOMY;  Surgeon: Olean Ree, MD;  Location: [facility] ORS;   Service: General;;   PARTIAL COLECTOMY N/A 01/01/2017   Procedure: PARTIAL COLECTOMY;  Surgeon: Olean Ree, MD;  Location: [facility] ORS;  Service: General;  Laterality: N/A;   PERIPHERAL VASCULAR CATHETERIZATION N/A 12/28/2016   Procedure: Visceral Artery Intervention;  Surgeon: Algernon Huxley, MD;  Location: [place] [facility];  Service: Cardiovascular;  Laterality: N/A;   SECONDARY CLOSURE OF WOUND N/A 01/08/2017   Procedure: SECONDARY CLOSURE FOR EVISCERATION;  Surgeon: Florene Glen, MD;  Location: [facility] ORS;  Service: General;  Laterality: N/A;   SIGMOIDECTOMY  2018   TOTAL HIP ARTHROPLASTY Right 04/24/2022   Procedure: TOTAL HIP ARTHROPLASTY ANTERIOR APPROACH;  Surgeon: Hessie Knows, MD;  Location: [facility] ORS;  Service: Orthopedics;  Laterality: Right;   TOTAL KNEE ARTHROPLASTY Right 03/24/2014   times 4    Social History Social History   Tobacco Use   Smoking status: Former    Packs/day: 0.25    Years: 29.00    Total pack years: 7.25    Types: Cigarettes    Quit date: 12/31/1993    Years since quitting: 28.5   Smokeless tobacco: Never  Vaping Use   Vaping Use: Never used  Substance Use Topics   Alcohol use: No    Alcohol/week: 0.0 standard drinks of alcohol   Drug use: No    Family History Family History  Problem Relation Age of Onset   Diabetes Sister    Ovarian cancer Sister    Diabetes Sister    Diabetes Brother    Heart attack Brother    Diabetes Brother     Allergies  Allergen Reactions   Azithromycin Rash   Penicillins Rash    Has patient had a PCN reaction causing immediate rash, facial/tongue/throat swelling, SOB or lightheadedness with hypotension: Yes Has patient had a PCN reaction causing severe rash involving mucus membranes or skin necrosis: Yes Has patient had a PCN reaction that required hospitalization No Has patient had a PCN reaction occurring within the last 10 years: No If all of the above answers are "NO", then may proceed with Cephalosporin  use.      Lisinopril Cough   Losartan Cough   Metoprolol Other (See Comments)    Bradycardia.     REVIEW OF SYSTEMS (Negative unless checked)  Constitutional: '[]'$ Weight loss  '[]'$ Fever  '[]'$ Chills Cardiac: '[]'$ Chest pain   '[]'$ Chest pressure   '[]'$ Palpitations   '[]'$ Shortness of breath when laying flat   '[]'$ Shortness of breath with exertion. Vascular:  '[]'$ Pain in legs with walking   '[x]'$ Pain in legs at rest  '[]'$ History of DVT   '[]'$ Phlebitis   '[x]'$ Swelling in legs   '[]'$ Varicose veins   '[]'$ Non-healing ulcers Pulmonary:   '[]'$ Uses home oxygen   '[]'$ Productive cough   '[]'$ Hemoptysis   '[]'$ Wheeze  '[]'$ COPD   '[]'$ Asthma Neurologic:  '[]'$ Dizziness   '[]'$ Seizures   '[]'$ History of stroke   '[]'$ History of TIA  '[]'$ Aphasia   '[]'$ Vissual changes   '[]'$ Weakness or numbness in arm   '[]'$ Weakness or numbness in leg Musculoskeletal:   '[]'$   Joint swelling   '[]'$ Joint pain   '[]'$ Low back pain Hematologic:  '[]'$ Easy bruising  '[]'$ Easy bleeding   '[]'$ Hypercoagulable state   '[]'$ Anemic Gastrointestinal:  '[]'$ Diarrhea   '[]'$ Vomiting  '[]'$ Gastroesophageal reflux/heartburn   '[]'$ Difficulty swallowing. Genitourinary:  '[]'$ Chronic kidney disease   '[]'$ Difficult urination  '[]'$ Frequent urination   '[]'$ Blood in urine Skin:  '[]'$ Rashes   '[]'$ Ulcers  Psychological:  '[]'$ History of anxiety   '[]'$  History of major depression.  Physical Examination  There were no vitals filed for this visit. There is no height or weight on file to calculate BMI. Gen: WD/WN, NAD Head: [place]/AT, No temporalis wasting.  Ear/Nose/Throat: Hearing grossly intact, nares w/o erythema or drainage, pinna without lesions Eyes: PER, EOMI, sclera nonicteric.  Neck: Supple, no gross masses.  No JVD.  Pulmonary:  Good air movement, no audible wheezing, no use of accessory muscles.  Cardiac: RRR, precordium not hyperdynamic. Vascular:  scattered varicosities present bilaterally.  Moderate venous stasis changes to the legs bilaterally.  2+ soft pitting edema  Vessel Right Left  Radial Palpable Palpable  Gastrointestinal: soft,  non-distended. No guarding/no peritoneal signs.  Musculoskeletal: M/S 5/5 throughout.  No deformity.  Neurologic: CN 2-12 intact. Pain and light touch intact in extremities.  Symmetrical.  Speech is fluent. Motor exam as listed above. Psychiatric: Judgment intact, Mood & affect appropriate for pt's clinical situation. Dermatologic: Venous rashes no ulcers noted.  No changes consistent with cellulitis. Lymph : No lichenification or skin changes of chronic lymphedema.  CBC Lab Results  Component Value Date   WBC 9.6 04/25/2022   HGB 15.1 04/25/2022   HCT 47.2 04/25/2022   MCV 85.2 04/25/2022   PLT 252 04/25/2022    BMET    Component Value Date/Time   NA 133 (L) 04/25/2022 0911   NA 136 03/26/2014 0418   K 4.5 04/25/2022 0911   K 4.0 03/26/2014 0418   CL 98 04/25/2022 0911   CL 102 03/26/2014 0418   CO2 29 04/25/2022 0911   CO2 34 (H) 03/26/2014 0418   GLUCOSE 192 (H) 04/25/2022 0911   GLUCOSE 114 (H) 03/26/2014 0418   BUN 10 04/25/2022 0911   BUN 8 03/26/2014 0418   CREATININE 0.83 04/25/2022 0911   CREATININE 0.90 03/26/2014 0418   CALCIUM 8.8 (L) 04/25/2022 0911   CALCIUM 9.2 03/26/2014 0418   GFRNONAA >60 04/25/2022 0911   GFRNONAA >60 03/26/2014 0418   GFRAA >60 06/07/2019 1326   GFRAA >60 03/26/2014 0418   CrCl cannot be calculated (Patient's most recent lab result is older than the maximum 21 days allowed.).  COAG Lab Results  Component Value Date   INR 1.06 02/22/2018   INR 1.11 01/08/2017   INR 1.18 01/01/2017    Radiology No results found.   Assessment/Plan 1. Osteoarthritis of left knee, unspecified osteoarthritis type Patient is s/p right hip replacement  2. 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  IVC filter should be removed.  Risk and benefits were reviewed the patient.  Indications for the procedure were reviewed.  All questions were answered, the patient agrees  to proceed.   Elevation was stressed, such as the use of a recliner.  I have discussed  DVT and post phlebitic changes such as swelling and why it  causes symptoms such as pain.  The patient should wear graduated compression stockings.  The compression should be worn on a daily basis. The patient should wear the stockings first  thing in the morning and removing them in the evening. The patient should not to sleep in the stockings.  In addition, behavioral modification including elevation during the day and avoidance of prolonged dependency will be initiated.    The patient will follow-up with me 2-3 months after the joint replacement surgery to discuss removal (this was also discussed today and the patient agrees with the plan to have the filter removed).   - VAS [place] LOWER EXTREMITY VENOUS (DVT)  3. Primary hypertension Continue antihypertensive medications as already ordered, these medications have been reviewed and there are no changes at this time.   4. 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  Hgb A1C to be monitored as already arranged by primary service   5. Hypercholesteremia Continue statin as ordered and reviewed, no changes at this time     Hortencia Pilar, MD  07/22/2022 2:13 PM

## 2022-07-23 ENCOUNTER — Ambulatory Visit (INDEPENDENT_AMBULATORY_CARE_PROVIDER_SITE_OTHER): Payer: Medicare HMO

## 2022-07-23 ENCOUNTER — Encounter (INDEPENDENT_AMBULATORY_CARE_PROVIDER_SITE_OTHER): Payer: Self-pay | Admitting: Vascular Surgery

## 2022-07-23 ENCOUNTER — Ambulatory Visit (INDEPENDENT_AMBULATORY_CARE_PROVIDER_SITE_OTHER): Payer: Medicare HMO | Admitting: Vascular Surgery

## 2022-07-23 VITALS — BP 146/79 | HR 80 | Resp 16 | Wt 277.6 lb

## 2022-07-23 DIAGNOSIS — I10 Essential (primary) hypertension: Secondary | ICD-10-CM | POA: Diagnosis not present

## 2022-07-23 DIAGNOSIS — E1121 Type 2 diabetes mellitus with diabetic nephropathy: Secondary | ICD-10-CM | POA: Diagnosis not present

## 2022-07-23 DIAGNOSIS — M1712 Unilateral primary osteoarthritis, left knee: Secondary | ICD-10-CM | POA: Diagnosis not present

## 2022-07-23 DIAGNOSIS — I82543 Chronic embolism and thrombosis of tibial vein, bilateral: Secondary | ICD-10-CM | POA: Diagnosis not present

## 2022-07-23 DIAGNOSIS — E7800 Pure hypercholesterolemia, unspecified: Secondary | ICD-10-CM

## 2022-07-27 ENCOUNTER — Telehealth (INDEPENDENT_AMBULATORY_CARE_PROVIDER_SITE_OTHER): Payer: Self-pay

## 2022-07-27 NOTE — Telephone Encounter (Signed)
Spoke with the patient and he is scheduled with Dr. Delana Meyer on 07/31/22 with a 12:00 pm arrival time to the MM for a IVC filter removal. Pre-procedure instructions were discussed and will be mailed.

## 2022-07-29 ENCOUNTER — Encounter (INDEPENDENT_AMBULATORY_CARE_PROVIDER_SITE_OTHER): Payer: Self-pay | Admitting: Vascular Surgery

## 2022-07-31 ENCOUNTER — Other Ambulatory Visit: Payer: Self-pay

## 2022-07-31 ENCOUNTER — Encounter
Admission: RE | Disposition: A | Discharge status: Home / Self Care | Payer: Self-pay | Source: Home / Self Care | Attending: Vascular Surgery

## 2022-07-31 ENCOUNTER — Encounter: Payer: Self-pay | Admitting: Vascular Surgery

## 2022-07-31 ENCOUNTER — Ambulatory Visit
Admission: RE | Admit: 2022-07-31 | Discharge: 2022-07-31 | Disposition: A | Discharge status: Home / Self Care | Payer: Medicare HMO | Attending: Vascular Surgery | Admitting: Vascular Surgery

## 2022-07-31 DIAGNOSIS — M1712 Unilateral primary osteoarthritis, left knee: Secondary | ICD-10-CM | POA: Insufficient documentation

## 2022-07-31 DIAGNOSIS — E7800 Pure hypercholesterolemia, unspecified: Secondary | ICD-10-CM | POA: Insufficient documentation

## 2022-07-31 DIAGNOSIS — Z4589 Encounter for adjustment and management of other implanted devices: Secondary | ICD-10-CM | POA: Insufficient documentation

## 2022-07-31 DIAGNOSIS — Z87891 Personal history of nicotine dependence: Secondary | ICD-10-CM | POA: Diagnosis not present

## 2022-07-31 DIAGNOSIS — E119 Type 2 diabetes mellitus without complications: Secondary | ICD-10-CM | POA: Insufficient documentation

## 2022-07-31 DIAGNOSIS — I110 Hypertensive heart disease with heart failure: Secondary | ICD-10-CM | POA: Diagnosis not present

## 2022-07-31 DIAGNOSIS — Z86718 Personal history of other venous thrombosis and embolism: Secondary | ICD-10-CM | POA: Insufficient documentation

## 2022-07-31 DIAGNOSIS — Z7901 Long term (current) use of anticoagulants: Secondary | ICD-10-CM

## 2022-07-31 DIAGNOSIS — I82409 Acute embolism and thrombosis of unspecified deep veins of unspecified lower extremity: Secondary | ICD-10-CM

## 2022-07-31 DIAGNOSIS — Z9889 Other specified postprocedural states: Secondary | ICD-10-CM

## 2022-07-31 DIAGNOSIS — Z09 Encounter for follow-up examination after completed treatment for conditions other than malignant neoplasm: Secondary | ICD-10-CM

## 2022-07-31 DIAGNOSIS — Z86711 Personal history of pulmonary embolism: Secondary | ICD-10-CM

## 2022-07-31 DIAGNOSIS — I509 Heart failure, unspecified: Secondary | ICD-10-CM | POA: Diagnosis not present

## 2022-07-31 HISTORY — PX: IVC FILTER REMOVAL: CATH118246

## 2022-07-31 SURGERY — IVC FILTER REMOVAL
Anesthesia: Moderate Sedation

## 2022-07-31 MED ORDER — SODIUM CHLORIDE 0.9 % IV SOLN: INTRAVENOUS | Status: DC

## 2022-07-31 MED ORDER — VANCOMYCIN HCL 1500 MG/300ML IV SOLN
1500.0000 mg | INTRAVENOUS | Status: AC
  Administered 2022-07-31: 1500 mg via INTRAVENOUS
  Filled 2022-07-31: qty 300

## 2022-07-31 MED ORDER — MIDAZOLAM HCL 2 MG/ML PO SYRP: 8.0000 mg | ORAL_SOLUTION | Freq: Once | ORAL | Status: DC | PRN

## 2022-07-31 MED ORDER — ONDANSETRON HCL 4 MG/2ML IJ SOLN: 4.0000 mg | Freq: Four times a day (QID) | INTRAMUSCULAR | Status: DC | PRN

## 2022-07-31 MED ORDER — METHYLPREDNISOLONE SODIUM SUCC 125 MG IJ SOLR: 125.0000 mg | Freq: Once | INTRAMUSCULAR | Status: DC | PRN

## 2022-07-31 MED ORDER — FENTANYL CITRATE PF 50 MCG/ML IJ SOSY
PREFILLED_SYRINGE | INTRAMUSCULAR | Status: AC
  Filled 2022-07-31: qty 2

## 2022-07-31 MED ORDER — FENTANYL CITRATE (PF) 100 MCG/2ML IJ SOLN
INTRAMUSCULAR | Status: DC | PRN
Start: 2022-07-31 — End: 2022-07-31
  Administered 2022-07-31: 50 ug via INTRAVENOUS
  Administered 2022-07-31: 12.5 ug via INTRAVENOUS

## 2022-07-31 MED ORDER — FAMOTIDINE 20 MG PO TABS: 40.0000 mg | ORAL_TABLET | Freq: Once | ORAL | Status: DC | PRN

## 2022-07-31 MED ORDER — MIDAZOLAM HCL 2 MG/2ML IJ SOLN
INTRAMUSCULAR | Status: DC | PRN
  Administered 2022-07-31: .5 mg via INTRAVENOUS
  Administered 2022-07-31: 2 mg via INTRAVENOUS

## 2022-07-31 MED ORDER — IODIXANOL 320 MG/ML IV SOLN
INTRAVENOUS | Status: DC | PRN
  Administered 2022-07-31: 15 mL via INTRAVENOUS

## 2022-07-31 MED ORDER — DIPHENHYDRAMINE HCL 50 MG/ML IJ SOLN
50.0000 mg | Freq: Once | INTRAMUSCULAR | Status: DC | PRN
Start: 2022-07-31 — End: 2022-07-31

## 2022-07-31 MED ORDER — HYDROMORPHONE HCL 1 MG/ML IJ SOLN: 1.0000 mg | Freq: Once | INTRAMUSCULAR | Status: DC | PRN

## 2022-07-31 MED ORDER — MIDAZOLAM HCL 2 MG/2ML IJ SOLN
INTRAMUSCULAR | Status: AC
  Filled 2022-07-31: qty 4

## 2022-07-31 SURGICAL SUPPLY — 11 items
CATH BEACON 5 .035 40 KMP TP (CATHETERS) IMPLANT
CATH BEACON 5 .038 40 KMP TP (CATHETERS) ×1
COVER PROBE U/S 5X48 (MISCELLANEOUS) ×1 IMPLANT
DEVICE ENSNARE  12MMX20MM (VASCULAR PRODUCTS) ×1
DEVICE ENSNARE 12MMX20MM (VASCULAR PRODUCTS) IMPLANT
KIT SNARE RETRIEVAL (KITS) ×1 IMPLANT
NDL ENTRY 21GA 7CM ECHOTIP (NEEDLE) IMPLANT
NEEDLE ENTRY 21GA 7CM ECHOTIP (NEEDLE) ×2 IMPLANT
PACK ANGIOGRAPHY (CUSTOM PROCEDURE TRAY) ×2 IMPLANT
SET INTRO CAPELLA COAXIAL (SET/KITS/TRAYS/PACK) ×1 IMPLANT
WIRE GUIDERIGHT .035X150 (WIRE) ×1 IMPLANT

## 2022-07-31 NOTE — Op Note (Signed)
  OPERATIVE NOTE   PRE-OPERATIVE DIAGNOSIS: History of DVT with PE; status post successful joint replacement surgery  POST-OPERATIVE DIAGNOSIS: Same  PROCEDURE: Retrieval of IVC Filter Inferior Vena Cavagram  SURGEON: Katha Cabal, M.D.  ANESTHESIA:  Conscious sedation was administered under my direct supervision by the interventional radiology RN. IV Versed plus fentanyl were utilized. Continuous ECG, pulse oximetry and blood pressure was monitored throughout the entire procedure. Conscious sedation was for a total of 31 minutes.  ESTIMATED BLOOD LOSS: Minimal cc  FINDING(S):inferior vena cava is widely patent filter is in place in good position. Filter is removed without incident  SPECIMEN(S):  IVC filter intact  CONTRAST:  15 CC.  FLUOROSCOPY TIME: 4.6 minutes.  INDICATIONS:   Evan [name] is a 77 y.o. male who presents with DVT and PE. The patient has now tolerated anticoagulation for several months. Therefore, the IVC filter is recommended to be removed. The risks and benefits were reviewed with the patient all questions were answered and they agreed to proceed with IVC filter retrieval. Oral anticoagulation will be continued.  DESCRIPTION: After obtaining full informed written consent, the patient was brought back to the Special Procedure Suite and placed in the supine position.  The patient received IV antibiotics prior to induction.  After obtaining adequate sedation, the patient was prepped and draped in the standard fashion and appropriate time out is called.     Ultrasound was placed in a sterile sleeve.The right neck was then imaged with ultrasound.   Jugular vein was identified it is echolucent and homogeneous indicating patency. 1% lidocaine is then infiltrated under ultrasound visualization and subsequently a Seldinger needle is inserted under real-time ultrasound guidance.  J-wire is then advanced into the inferior vena cava under fluoroscopic guidance. With  the tip of the sheath at the confluence of the iliac veins inferior vena caval imaging is performed.  After review of the image the sheath is repositioned to above the filter and the snares introduced. Snares opened and the hook is secured without difficulty. The filter is then collapsed within the sheath and removed without difficulty.  Sheath is removed by pressures held the patient tolerated the procedure well and there were no immediate complications.  Interpretation: inferior vena cava is widely patent filter is in place in good position. Filter is removed without incident.     COMPLICATIONS: None  CONDITION: Evan [name], M.D. [facility] Office: [phone]   07/31/2022, 1:45 PM

## 2022-07-31 NOTE — Interval H&P Note (Signed)
History and Physical Interval Note:  07/31/2022 12:43 PM  Evan [name]  has presented today for surgery, with the diagnosis of IVC Filter Removal   DVT.  The various methods of treatment have been discussed with the patient and family. After consideration of risks, benefits and other options for treatment, the patient has consented to  Procedure(s): IVC FILTER REMOVAL (N/A) as a surgical intervention.  The patient's history has been reviewed, patient examined, no change in status, stable for surgery.  I have reviewed the patient's chart and labs.  Questions were answered to the patient's satisfaction.     Hortencia Pilar

## 2022-08-01 ENCOUNTER — Encounter: Payer: Self-pay | Admitting: Vascular Surgery

## 2022-08-23 ENCOUNTER — Ambulatory Visit (INDEPENDENT_AMBULATORY_CARE_PROVIDER_SITE_OTHER): Payer: Medicare HMO | Admitting: Vascular Surgery

## 2022-09-09 NOTE — Progress Notes (Signed)
MRN : [id number]  Evan [name] is a 77 y.o. (26-Aug-1945) male who presents with chief complaint of legs hurt and swell.  History of Present Illness:   The patient presents to the office for follow up s/p IVC filter removal 07/31/2022.  The patient is s/p right TKR with a history of DVT/PE.  Joint replacement was 04/24/2022.  IVC filter was placed approximately one week prior to joint surgery.     The patient notes their leg continues to be a little painful with prolonged dependency and the patient notes the leg does still swell some.  Symptoms are better with elevation.  The patient notes minimal edema in the morning which steadily worsens throughout the day.     The patient has not been using compression therapy at this point.   No SOB or pleuritic chest pains.  No cough or hemoptysis.   No blood per rectum or blood in any sputum.  No excessive bruising per the patient.   No recent shortening of the patient's walking distance or new symptoms consistent with claudication.  No history of rest pain symptoms. No new ulcers or wounds of the lower extremities have occurred.   No outpatient medications have been marked as taking for the 09/10/22 encounter (Appointment) with Delana Meyer, Dolores Lory, MD.    Past Medical History:  Diagnosis Date   Adenomatous polyp of colon    Arthritis    xDJD   Atrial fibrillation ([place])    BPH (benign prostatic hyperplasia)    CHF (congestive heart failure) ([place])    COPD (chronic obstructive pulmonary disease) ([place])    Diabetes mellitus without complication ([place])    type 2   Discitis    Diverticulitis    Diverticulosis    DVT (deep venous thrombosis) ([place])    bilateral tibial   Erectile dysfunction    Hearing loss    Hyperlipidemia    Hypertension    Ischemic optic neuropathy    Lower GI bleeding    Lumbar degenerative disc disease    Non-ischemic cardiomyopathy ([place])    Sleep apnea     Past Surgical History:  Procedure Laterality  Date   ATRIAL ABLATION SURGERY  2010   CATARACT EXTRACTION W/PHACO Right 10/10/2020   Procedure: CATARACT EXTRACTION PHACO AND INTRAOCULAR LENS PLACEMENT (IOC) RIGHT ISTENT INJ DIABETIC 2.64  00:29.4;  Surgeon: Eulogio Bear, MD;  Location: [place];  Service: Ophthalmology;  Laterality: Right;  Diabetic - oral meds   CATARACT EXTRACTION W/PHACO Left 03/06/2021   Procedure: CATARACT EXTRACTION PHACO AND INTRAOCULAR LENS PLACEMENT (IOC) LEFT DIABETIC ISTENT INJ 3.62 00:30.6;  Surgeon: Eulogio Bear, MD;  Location: [place];  Service: Ophthalmology;  Laterality: Left;  Diabetic - oral meds   CHOLECYSTECTOMY  2011   COLONOSCOPY Left 02/24/2018   Procedure: COLONOSCOPY;  Surgeon: Virgel Manifold, MD;  Location: [facility] ENDOSCOPY;  Service: Endoscopy;  Laterality: Left;   COLONOSCOPY     2008, 2010, 2013   COLONOSCOPY WITH PROPOFOL N/A 09/25/2017   Procedure: COLONOSCOPY WITH PROPOFOL;  Surgeon: Manya Silvas, MD;  Location: [facility] ENDOSCOPY;  Service: Endoscopy;  Laterality: N/A;   COLONOSCOPY WITH PROPOFOL N/A 02/18/2018   Procedure: COLONOSCOPY WITH PROPOFOL;  Surgeon: [name], MD;  Location: [facility] ENDOSCOPY;  Service: Endoscopy;  Laterality: N/A;   ERCP  2011   ESOPHAGOGASTRODUODENOSCOPY Left 02/23/2018   Procedure: ESOPHAGOGASTRODUODENOSCOPY (EGD);  Surgeon: Virgel Manifold, MD;  Location:  [place] [facility];  Service: Endoscopy;  Laterality: Left;   ESOPHAGOGASTRODUODENOSCOPY  2014   IVC FILTER INSERTION N/A 04/17/2022   Procedure: IVC FILTER INSERTION;  Surgeon: Katha Cabal, MD;  Location: [place] CV LAB;  Service: Cardiovascular;  Laterality: N/A;   IVC FILTER REMOVAL N/A 07/31/2022   Procedure: IVC FILTER REMOVAL;  Surgeon: Katha Cabal, MD;  Location: [place] [facility];  Service: Cardiovascular;  Laterality: N/A;   LAPAROTOMY  01/01/2017   Procedure: EXPLORATORY LAPAROTOMY;  Surgeon: Olean Ree, MD;  Location: [facility] ORS;   Service: General;;   PARTIAL COLECTOMY N/A 01/01/2017   Procedure: PARTIAL COLECTOMY;  Surgeon: Olean Ree, MD;  Location: [facility] ORS;  Service: General;  Laterality: N/A;   PERIPHERAL VASCULAR CATHETERIZATION N/A 12/28/2016   Procedure: Visceral Artery Intervention;  Surgeon: Algernon Huxley, MD;  Location: [place] [facility];  Service: Cardiovascular;  Laterality: N/A;   SECONDARY CLOSURE OF WOUND N/A 01/08/2017   Procedure: SECONDARY CLOSURE FOR EVISCERATION;  Surgeon: Florene Glen, MD;  Location: [facility] ORS;  Service: General;  Laterality: N/A;   SIGMOIDECTOMY  2018   TOTAL HIP ARTHROPLASTY Right 04/24/2022   Procedure: TOTAL HIP ARTHROPLASTY ANTERIOR APPROACH;  Surgeon: Hessie Knows, MD;  Location: [facility] ORS;  Service: Orthopedics;  Laterality: Right;   TOTAL KNEE ARTHROPLASTY Right 03/24/2014   times 4    Social History Social History   Tobacco Use   Smoking status: Former    Packs/day: 0.25    Years: 29.00    Total pack years: 7.25    Types: Cigarettes    Quit date: 12/31/1993    Years since quitting: 28.7   Smokeless tobacco: Never  Vaping Use   Vaping Use: Never used  Substance Use Topics   Alcohol use: No    Alcohol/week: 0.0 standard drinks of alcohol   Drug use: No    Family History Family History  Problem Relation Age of Onset   Diabetes Sister    Ovarian cancer Sister    Diabetes Sister    Diabetes Brother    Heart attack Brother    Diabetes Brother     Allergies  Allergen Reactions   Azithromycin Rash   Penicillins Rash    Has patient had a PCN reaction causing immediate rash, facial/tongue/throat swelling, SOB or lightheadedness with hypotension: Yes Has patient had a PCN reaction causing severe rash involving mucus membranes or skin necrosis: Yes Has patient had a PCN reaction that required hospitalization No Has patient had a PCN reaction occurring within the last 10 years: No If all of the above answers are "NO", then may proceed with Cephalosporin  use.      Lisinopril Cough   Losartan Cough   Metoprolol Other (See Comments)    Bradycardia.     REVIEW OF SYSTEMS (Negative unless checked)  Constitutional: '[]'$ Weight loss  '[]'$ Fever  '[]'$ Chills Cardiac: '[]'$ Chest pain   '[]'$ Chest pressure   '[]'$ Palpitations   '[]'$ Shortness of breath when laying flat   '[]'$ Shortness of breath with exertion. Vascular:  '[]'$ Pain in legs with walking   '[x]'$ Pain in legs at rest  '[]'$ History of DVT   '[]'$ Phlebitis   '[x]'$ Swelling in legs   '[]'$ Varicose veins   '[]'$ Non-healing ulcers Pulmonary:   '[]'$ Uses home oxygen   '[]'$ Productive cough   '[]'$ Hemoptysis   '[]'$ Wheeze  '[]'$ COPD   '[]'$ Asthma Neurologic:  '[]'$ Dizziness   '[]'$ Seizures   '[]'$ History of stroke   '[]'$ History of TIA  '[]'$ Aphasia   '[]'$ Vissual changes   '[]'$ Weakness or numbness  in arm   '[]'$ Weakness or numbness in leg Musculoskeletal:   '[]'$ Joint swelling   '[]'$ Joint pain   '[]'$ Low back pain Hematologic:  '[]'$ Easy bruising  '[]'$ Easy bleeding   '[]'$ Hypercoagulable state   '[]'$ Anemic Gastrointestinal:  '[]'$ Diarrhea   '[]'$ Vomiting  '[]'$ Gastroesophageal reflux/heartburn   '[]'$ Difficulty swallowing. Genitourinary:  '[]'$ Chronic kidney disease   '[]'$ Difficult urination  '[]'$ Frequent urination   '[]'$ Blood in urine Skin:  '[]'$ Rashes   '[]'$ Ulcers  Psychological:  '[]'$ History of anxiety   '[]'$  History of major depression.  Physical Examination  There were no vitals filed for this visit. There is no height or weight on file to calculate BMI. Gen: WD/WN, NAD Head: [place]/AT, No temporalis wasting.  Ear/Nose/Throat: Hearing grossly intact, nares w/o erythema or drainage, pinna without lesions Eyes: PER, EOMI, sclera nonicteric.  Neck: Supple, no gross masses.  No JVD.  Pulmonary:  Good air movement, no audible wheezing, no use of accessory muscles.  Cardiac: RRR, precordium not hyperdynamic. Vascular:  scattered varicosities present bilaterally.  Moderate venous stasis changes to the legs bilaterally.  2+ soft pitting edema  Vessel Right Left  Radial Palpable Palpable  Gastrointestinal: soft,  non-distended. No guarding/no peritoneal signs.  Musculoskeletal: M/S 5/5 throughout.  No deformity.  Neurologic: CN 2-12 intact. Pain and light touch intact in extremities.  Symmetrical.  Speech is fluent. Motor exam as listed above. Psychiatric: Judgment intact, Mood & affect appropriate for pt's clinical situation. Dermatologic: Venous rashes no ulcers noted.  No changes consistent with cellulitis. Lymph : No lichenification or skin changes of chronic lymphedema.  CBC Lab Results  Component Value Date   WBC 9.6 04/25/2022   HGB 15.1 04/25/2022   HCT 47.2 04/25/2022   MCV 85.2 04/25/2022   PLT 252 04/25/2022    BMET    Component Value Date/Time   NA 133 (L) 04/25/2022 0911   NA 136 03/26/2014 0418   K 4.5 04/25/2022 0911   K 4.0 03/26/2014 0418   CL 98 04/25/2022 0911   CL 102 03/26/2014 0418   CO2 29 04/25/2022 0911   CO2 34 (H) 03/26/2014 0418   GLUCOSE 192 (H) 04/25/2022 0911   GLUCOSE 114 (H) 03/26/2014 0418   BUN 10 04/25/2022 0911   BUN 8 03/26/2014 0418   CREATININE 0.83 04/25/2022 0911   CREATININE 0.90 03/26/2014 0418   CALCIUM 8.8 (L) 04/25/2022 0911   CALCIUM 9.2 03/26/2014 0418   GFRNONAA >60 04/25/2022 0911   GFRNONAA >60 03/26/2014 0418   GFRAA >60 06/07/2019 1326   GFRAA >60 03/26/2014 0418   CrCl cannot be calculated (Patient's most recent lab result is older than the maximum 21 days allowed.).  COAG Lab Results  Component Value Date   INR 1.06 02/22/2018   INR 1.11 01/08/2017   INR 1.18 01/01/2017    Radiology No results found.   Assessment/Plan 1. Acute deep vein thrombosis (DVT) of popliteal vein of right lower extremity (HCC) Recommend:  No surgery or intervention at this point in time.  I have reviewed my discussion with the patient regarding venous insufficiency and post phlebitic syndrome and why it causes symptoms. I have discussed with the patient the chronic skin changes that accompany venous insufficiency and the long term  sequela such as ulceration. Patient will contnue wearing graduated compression stockings on a daily basis, as this has provided excellent control of his edema. The patient will put the stockings on first thing in the morning and removing them in the evening. The patient is reminded not to sleep in  the stockings.  In addition, behavioral modification including elevation during the day will be initiated. Exercise is strongly encouraged.  Previous duplex ultrasound of the lower extremities shows normal deep system, no significant superficial reflux was identified.  Given the patient's good control and lack of any problems regarding the venous insufficiency and lymphedema a lymph pump in not need at this time.    The patient will follow up with me PRN should anything change.  The patient voices agreement with this plan.   2. Stage 2 moderate COPD by GOLD classification ([place]) Continue pulmonary medications and aerosols as already ordered, these medications have been reviewed and there are no changes at this time.    3. 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  Hgb A1C to be monitored as already arranged by primary service   4. Hypercholesteremia Continue statin as ordered and reviewed, no changes at this time     Hortencia Pilar, MD  09/09/2022 7:45 PM

## 2022-09-10 ENCOUNTER — Encounter (INDEPENDENT_AMBULATORY_CARE_PROVIDER_SITE_OTHER): Payer: Self-pay | Admitting: Vascular Surgery

## 2022-09-10 ENCOUNTER — Ambulatory Visit (INDEPENDENT_AMBULATORY_CARE_PROVIDER_SITE_OTHER): Payer: Medicare HMO | Admitting: Vascular Surgery

## 2022-09-10 VITALS — BP 134/72 | HR 89 | Resp 17 | Ht 76.0 in | Wt 286.0 lb

## 2022-09-10 DIAGNOSIS — I82431 Acute embolism and thrombosis of right popliteal vein: Secondary | ICD-10-CM | POA: Diagnosis not present

## 2022-09-10 DIAGNOSIS — J449 Chronic obstructive pulmonary disease, unspecified: Secondary | ICD-10-CM

## 2022-09-10 DIAGNOSIS — E1121 Type 2 diabetes mellitus with diabetic nephropathy: Secondary | ICD-10-CM

## 2022-09-10 DIAGNOSIS — E7800 Pure hypercholesterolemia, unspecified: Secondary | ICD-10-CM

## 2022-09-16 ENCOUNTER — Encounter (INDEPENDENT_AMBULATORY_CARE_PROVIDER_SITE_OTHER): Payer: Self-pay | Admitting: Vascular Surgery

## 2023-02-07 ENCOUNTER — Encounter: Payer: Self-pay | Admitting: Oncology

## 2023-02-28 ENCOUNTER — Encounter: Payer: Self-pay | Admitting: Urology

## 2023-02-28 ENCOUNTER — Ambulatory Visit: Payer: Medicare HMO | Admitting: Urology

## 2023-02-28 VITALS — BP 154/68 | HR 85 | Ht 75.0 in | Wt 285.6 lb

## 2023-02-28 DIAGNOSIS — R972 Elevated prostate specific antigen [PSA]: Secondary | ICD-10-CM | POA: Diagnosis not present

## 2023-02-28 NOTE — Patient Instructions (Signed)
Prostate MRI Prep:  1- No ejaculation 48 hours prior to exam  2- No caffeine or carbonated beverages on day of the exam  3- Eat light diet evening prior and day of exam  4- Avoid eating 4 hours prior to exam  5- Fleets enema needs to be done 4 hours prior to exam -See below. Can be purchased at the drug store.

## 2023-02-28 NOTE — Progress Notes (Signed)
02/28/2023 10:39 AM   Evan [name] 1945/12/21 DJ:[id number]  Referring provider: Leonel Ramsay, MD [place],  [place] [id number]  Chief Complaint  Patient presents with   Elevated PSA    HPI: Evan [name] is a 78 y.o. male referred for evaluation of an elevated PSA.  PSA 01/31/2023 was 6.87; was 6.24 07/31/2022 Baseline PSA between 2017-2021 was 3.21-3.96 No bothersome LUTS Denies dysuria, gross hematuria No flank, abdominal or pelvic pain No family history prostate cancer   PMH: Past Medical History:  Diagnosis Date   Adenomatous polyp of colon    Arthritis    xDJD   Atrial fibrillation (HCC)    BPH (benign prostatic hyperplasia)    CHF (congestive heart failure) (HCC)    COPD (chronic obstructive pulmonary disease) ([place])    Diabetes mellitus without complication ([place])    type 2   Discitis    Diverticulitis    Diverticulosis    DVT (deep venous thrombosis) (HCC)    bilateral tibial   Erectile dysfunction    Hearing loss    Hyperlipidemia    Hypertension    Ischemic optic neuropathy    Lower GI bleeding    Lumbar degenerative disc disease    Non-ischemic cardiomyopathy ([place])    Sleep apnea     Surgical History: Past Surgical History:  Procedure Laterality Date   ATRIAL ABLATION SURGERY  2010   CATARACT EXTRACTION W/PHACO Right 10/10/2020   Procedure: CATARACT EXTRACTION PHACO AND INTRAOCULAR LENS PLACEMENT (IOC) RIGHT ISTENT INJ DIABETIC 2.64  00:29.4;  Surgeon: Eulogio Bear, MD;  Location: [place];  Service: Ophthalmology;  Laterality: Right;  Diabetic - oral meds   CATARACT EXTRACTION W/PHACO Left 03/06/2021   Procedure: CATARACT EXTRACTION PHACO AND INTRAOCULAR LENS PLACEMENT (IOC) LEFT DIABETIC ISTENT INJ 3.62 00:30.6;  Surgeon: Eulogio Bear, MD;  Location: [place];  Service: Ophthalmology;  Laterality: Left;  Diabetic - oral meds   CHOLECYSTECTOMY  2011   COLONOSCOPY Left 02/24/2018    Procedure: COLONOSCOPY;  Surgeon: Virgel Manifold, MD;  Location: [facility]OSCOPY;  Service: Endoscopy;  Laterality: Left;   COLONOSCOPY     2008, 2010, 2013   COLONOSCOPY WITH PROPOFOL N/A 09/25/2017   Procedure: COLONOSCOPY WITH PROPOFOL;  Surgeon: Manya Silvas, MD;  Location: [facility] ENDOSCOPY;  Service: Endoscopy;  Laterality: N/A;   COLONOSCOPY WITH PROPOFOL N/A 02/18/2018   Procedure: COLONOSCOPY WITH PROPOFOL;  Surgeon: [name], MD;  Location: [facility] ENDOSCOPY;  Service: Endoscopy;  Laterality: N/A;   ERCP  2011   ESOPHAGOGASTRODUODENOSCOPY Left 02/23/2018   Procedure: ESOPHAGOGASTRODUODENOSCOPY (EGD);  Surgeon: Virgel Manifold, MD;  Location: [facility] ENDOSCOPY;  Service: Endoscopy;  Laterality: Left;   ESOPHAGOGASTRODUODENOSCOPY  2014   IVC FILTER INSERTION N/A 04/17/2022   Procedure: IVC FILTER INSERTION;  Surgeon: Katha Cabal, MD;  Location: [place] CV LAB;  Service: Cardiovascular;  Laterality: N/A;   IVC FILTER REMOVAL N/A 07/31/2022   Procedure: IVC FILTER REMOVAL;  Surgeon: Katha Cabal, MD;  Location: [place] CV LAB;  Service: Cardiovascular;  Laterality: N/A;   LAPAROTOMY  01/01/2017   Procedure: EXPLORATORY LAPAROTOMY;  Surgeon: Olean Ree, MD;  Location: [facility] ORS;  Service: General;;   PARTIAL COLECTOMY N/A 01/01/2017   Procedure: PARTIAL COLECTOMY;  Surgeon: Olean Ree, MD;  Location: [facility];  Service: General;  Laterality: N/A;   PERIPHERAL VASCULAR CATHETERIZATION N/A 12/28/2016   Procedure: Visceral Artery Intervention;  Surgeon: Algernon Huxley, MD;  Location: [place] [facility];  Service: Cardiovascular;  Laterality: N/A;   SECONDARY CLOSURE OF WOUND N/A 01/08/2017   Procedure: SECONDARY CLOSURE FOR EVISCERATION;  Surgeon: Florene Glen, MD;  Location: [facility] ORS;  Service: General;  Laterality: N/A;   SIGMOIDECTOMY  2018   TOTAL HIP ARTHROPLASTY Right 04/24/2022   Procedure: TOTAL HIP ARTHROPLASTY ANTERIOR APPROACH;  Surgeon:  Hessie Knows, MD;  Location: [facility];  Service: Orthopedics;  Laterality: Right;   TOTAL KNEE ARTHROPLASTY Right 03/24/2014   times 4    Home Medications:  Allergies as of 02/28/2023       Reactions   Azithromycin Rash   Penicillins Rash   Has patient had a PCN reaction causing immediate rash, facial/tongue/throat swelling, SOB or lightheadedness with hypotension: Yes Has patient had a PCN reaction causing severe rash involving mucus membranes or skin necrosis: Yes Has patient had a PCN reaction that required hospitalization No Has patient had a PCN reaction occurring within the last 10 years: No If all of the above answers are "NO", then may proceed with Cephalosporin use.   Lisinopril Cough   Losartan Cough   Metoprolol Other (See Comments)   Bradycardia.        Medication List        Accurate as of February 28, 2023 10:39 AM. If you have any questions, ask your nurse or doctor.          STOP taking these medications    enoxaparin 40 MG/0.4ML injection Commonly known as: LOVENOX   tamsulosin 0.4 MG Caps capsule Commonly known as: FLOMAX       TAKE these medications    acetaminophen 500 MG tablet Commonly known as: TYLENOL Take 1,000 mg by mouth every 8 (eight) hours as needed.   albuterol 108 (90 Base) MCG/ACT inhaler Commonly known as: VENTOLIN HFA SMARTSIG:2 inhalation Via Inhaler Every 6 Hours PRN   amLODipine 10 MG tablet Commonly known as: NORVASC Take 10 mg by mouth daily.   aspirin EC 81 MG tablet Take 1 tablet (81 mg total) by mouth daily.   CENTRUM SILVER PO Take 1 tablet by mouth every morning.   cyanocobalamin 1000 MCG tablet Commonly known as: VITAMIN B12 Take 1,000 mcg by mouth daily.   hydrOXYzine 25 MG tablet Commonly known as: ATARAX Take 25 mg by mouth at bedtime as needed (sleep).   metFORMIN 500 MG 24 hr tablet Commonly known as: GLUCOPHAGE-XR 1,000 mg daily with breakfast.   polyethylene glycol 17 g packet Commonly  known as: MIRALAX / GLYCOLAX Take 17 g by mouth daily. What changed:  when to take this reasons to take this   pravastatin 40 MG tablet Commonly known as: PRAVACHOL Take 40 mg by mouth at bedtime.   Vitamin D3 50 MCG (2000 UT) Caps Generic drug: Cholecalciferol Take 2,000 Units by mouth daily.        Allergies:  Allergies  Allergen Reactions   Azithromycin Rash   Penicillins Rash    Has patient had a PCN reaction causing immediate rash, facial/tongue/throat swelling, SOB or lightheadedness with hypotension: Yes Has patient had a PCN reaction causing severe rash involving mucus membranes or skin necrosis: Yes Has patient had a PCN reaction that required hospitalization No Has patient had a PCN reaction occurring within the last 10 years: No If all of the above answers are "NO", then may proceed with Cephalosporin use.      Lisinopril Cough   Losartan Cough   Metoprolol Other (See Comments)  Bradycardia.    Family History: Family History  Problem Relation Age of Onset   Diabetes Sister    Ovarian cancer Sister    Diabetes Sister    Diabetes Brother    Heart attack Brother    Diabetes Brother     Social History:  reports that he quit smoking about 29 years ago. His smoking use included cigarettes. He has a 7.25 pack-year smoking history. He has never used smokeless tobacco. He reports that he does not drink alcohol and does not use drugs.   Physical Exam: BP (!) 154/68 (BP Location: Left Arm, Patient Position: Sitting, Cuff Size: Large)   Pulse 85   Ht '6\' 3"'$  (1.905 m)   Wt 285 lb 9.6 oz (129.5 kg)   BMI 35.70 kg/m   Constitutional:  Alert and oriented, No acute distress. HEENT: [place] AT Respiratory: Normal respiratory effort, no increased work of breathing. GI: Abdomen is soft, nontender, nondistended, no abdominal masses GU: Prostate 40 g, smooth without nodules Skin: No rashes, bruises or suspicious lesions. Neurologic: Grossly intact, no focal deficits,  moving all 4 extremities. Psychiatric: Normal mood and affect.   Assessment & Plan:    1.  Elevated PSA Benign DRE Although PSA is a prostate cancer screening test he was informed that cancer is not the most common cause of an elevated PSA. Other potential causes including BPH and inflammation were discussed. He was informed that the only way to adequately diagnose prostate cancer would be a transrectal ultrasound and biopsy of the prostate. The procedure was discussed including potential risks of bleeding and infection/sepsis. He was also informed that a negative biopsy does not conclusively rule out the possibility that prostate cancer may be present and that continued monitoring is required. The use of newer adjunctive blood tests including PHI and 4kScore were discussed. The use of multiparametric prostate MRI to evaluate for abnormality suspicious for high-grade prostate cancer and aid in targeted biopsy was reviewed. Continued periodic surveillance was also discussed. He has elected to proceed with prostate MRI and order was placed.  Will call with results.  Denies history of pacemaker or metallic implants   Abbie Sons, MD  [facility] [address] [place], [place] [address] [phone]

## 2023-03-14 ENCOUNTER — Ambulatory Visit
Admission: RE | Admit: 2023-03-14 | Discharge: 2023-03-14 | Disposition: A | Discharge status: Home / Self Care | Payer: Medicare HMO | Source: Ambulatory Visit | Attending: Urology | Admitting: Urology

## 2023-03-14 DIAGNOSIS — R972 Elevated prostate specific antigen [PSA]: Secondary | ICD-10-CM | POA: Insufficient documentation

## 2023-03-14 MED ORDER — GADOBUTROL 1 MMOL/ML IV SOLN
10.0000 mL | Freq: Once | INTRAVENOUS | Status: AC | PRN
  Administered 2023-03-14: 10 mL via INTRAVENOUS

## 2023-03-22 ENCOUNTER — Telehealth: Payer: Self-pay

## 2023-03-22 ENCOUNTER — Telehealth: Payer: Self-pay | Admitting: Urology

## 2023-03-22 NOTE — Telephone Encounter (Signed)
Evan [name] 03/22/2023    Provider Requested: [facility] MRI date and location: 03/14/23 [facility]   Date of fusion bx: 04/17/23 Time: 2:30pm   Blood Thinners- no ASA- Yes-  per PCP. Advised  to stop 7 days prior.  Nsaids- Stop 2 days prior. Pt aware.  Clearance needed-No   Fleet's Enema OTC 2 hours prior - Pt aware.    Follow up appt:05/01/23 845am  Instructions sent via my chart or mailed. CM

## 2023-03-22 NOTE — Telephone Encounter (Signed)
Notified patient as instructed, patient would like to have the fusion biopsy in [place] on  04/17/2023

## 2023-03-22 NOTE — Telephone Encounter (Signed)
Prostate MRI did show an abnormality which was suspicious for high-grade prostate cancer and a second abnormality which was considered indeterminate.  Recommend scheduling MR fusion biopsy in [place].  I am happy to call him if he has any questions.  If he desires to proceed with fusion biopsy please message [facility] for scheduling.

## 2023-03-26 NOTE — Telephone Encounter (Signed)
Done. See previous telephone encounter.

## 2023-04-05 ENCOUNTER — Inpatient Hospital Stay
Admission: EM | Admit: 2023-04-05 | Discharge: 2023-04-08 | DRG: 378 | Disposition: A | Discharge status: Home / Self Care | Payer: Medicare HMO | Attending: Internal Medicine | Admitting: Internal Medicine

## 2023-04-05 ENCOUNTER — Emergency Department: Payer: Medicare HMO

## 2023-04-05 ENCOUNTER — Other Ambulatory Visit: Payer: Self-pay

## 2023-04-05 DIAGNOSIS — Z980 Intestinal bypass and anastomosis status: Secondary | ICD-10-CM

## 2023-04-05 DIAGNOSIS — Z8249 Family history of ischemic heart disease and other diseases of the circulatory system: Secondary | ICD-10-CM

## 2023-04-05 DIAGNOSIS — Z888 Allergy status to other drugs, medicaments and biological substances status: Secondary | ICD-10-CM

## 2023-04-05 DIAGNOSIS — Z7984 Long term (current) use of oral hypoglycemic drugs: Secondary | ICD-10-CM

## 2023-04-05 DIAGNOSIS — Z79899 Other long term (current) drug therapy: Secondary | ICD-10-CM

## 2023-04-05 DIAGNOSIS — Z8041 Family history of malignant neoplasm of ovary: Secondary | ICD-10-CM

## 2023-04-05 DIAGNOSIS — Z6835 Body mass index (BMI) 35.0-35.9, adult: Secondary | ICD-10-CM

## 2023-04-05 DIAGNOSIS — D62 Acute posthemorrhagic anemia: Secondary | ICD-10-CM | POA: Diagnosis present

## 2023-04-05 DIAGNOSIS — I509 Heart failure, unspecified: Secondary | ICD-10-CM

## 2023-04-05 DIAGNOSIS — Z9049 Acquired absence of other specified parts of digestive tract: Secondary | ICD-10-CM

## 2023-04-05 DIAGNOSIS — E669 Obesity, unspecified: Secondary | ICD-10-CM | POA: Diagnosis present

## 2023-04-05 DIAGNOSIS — Z86718 Personal history of other venous thrombosis and embolism: Secondary | ICD-10-CM

## 2023-04-05 DIAGNOSIS — I110 Hypertensive heart disease with heart failure: Secondary | ICD-10-CM | POA: Diagnosis present

## 2023-04-05 DIAGNOSIS — Z880 Allergy status to penicillin: Secondary | ICD-10-CM

## 2023-04-05 DIAGNOSIS — K922 Gastrointestinal hemorrhage, unspecified: Secondary | ICD-10-CM | POA: Diagnosis present

## 2023-04-05 DIAGNOSIS — R809 Proteinuria, unspecified: Secondary | ICD-10-CM | POA: Diagnosis present

## 2023-04-05 DIAGNOSIS — Z96651 Presence of right artificial knee joint: Secondary | ICD-10-CM | POA: Diagnosis present

## 2023-04-05 DIAGNOSIS — I480 Paroxysmal atrial fibrillation: Secondary | ICD-10-CM | POA: Diagnosis present

## 2023-04-05 DIAGNOSIS — E7800 Pure hypercholesterolemia, unspecified: Secondary | ICD-10-CM | POA: Diagnosis present

## 2023-04-05 DIAGNOSIS — K219 Gastro-esophageal reflux disease without esophagitis: Secondary | ICD-10-CM | POA: Diagnosis present

## 2023-04-05 DIAGNOSIS — Z881 Allergy status to other antibiotic agents status: Secondary | ICD-10-CM

## 2023-04-05 DIAGNOSIS — I10 Essential (primary) hypertension: Secondary | ICD-10-CM | POA: Diagnosis not present

## 2023-04-05 DIAGNOSIS — I5032 Chronic diastolic (congestive) heart failure: Secondary | ICD-10-CM | POA: Diagnosis present

## 2023-04-05 DIAGNOSIS — Z7982 Long term (current) use of aspirin: Secondary | ICD-10-CM

## 2023-04-05 DIAGNOSIS — M1712 Unilateral primary osteoarthritis, left knee: Secondary | ICD-10-CM | POA: Diagnosis present

## 2023-04-05 DIAGNOSIS — H9190 Unspecified hearing loss, unspecified ear: Secondary | ICD-10-CM | POA: Diagnosis present

## 2023-04-05 DIAGNOSIS — Z86010 Personal history of colonic polyps: Secondary | ICD-10-CM

## 2023-04-05 DIAGNOSIS — N400 Enlarged prostate without lower urinary tract symptoms: Secondary | ICD-10-CM | POA: Diagnosis present

## 2023-04-05 DIAGNOSIS — D509 Iron deficiency anemia, unspecified: Secondary | ICD-10-CM | POA: Diagnosis present

## 2023-04-05 DIAGNOSIS — Z96641 Presence of right artificial hip joint: Secondary | ICD-10-CM | POA: Diagnosis present

## 2023-04-05 DIAGNOSIS — Z833 Family history of diabetes mellitus: Secondary | ICD-10-CM

## 2023-04-05 DIAGNOSIS — I428 Other cardiomyopathies: Secondary | ICD-10-CM | POA: Diagnosis present

## 2023-04-05 DIAGNOSIS — J449 Chronic obstructive pulmonary disease, unspecified: Secondary | ICD-10-CM | POA: Diagnosis present

## 2023-04-05 DIAGNOSIS — K640 First degree hemorrhoids: Secondary | ICD-10-CM | POA: Diagnosis present

## 2023-04-05 DIAGNOSIS — E1121 Type 2 diabetes mellitus with diabetic nephropathy: Secondary | ICD-10-CM | POA: Diagnosis present

## 2023-04-05 DIAGNOSIS — K5731 Diverticulosis of large intestine without perforation or abscess with bleeding: Secondary | ICD-10-CM | POA: Diagnosis not present

## 2023-04-05 DIAGNOSIS — Z87891 Personal history of nicotine dependence: Secondary | ICD-10-CM

## 2023-04-05 DIAGNOSIS — G4733 Obstructive sleep apnea (adult) (pediatric): Secondary | ICD-10-CM | POA: Diagnosis present

## 2023-04-05 LAB — CBC WITH DIFFERENTIAL/PLATELET
Abs Immature Granulocytes: 0.04 10*3/uL (ref 0.00–0.07)
Basophils Absolute: 0.1 10*3/uL (ref 0.0–0.1)
Basophils Relative: 1 %
Eosinophils Absolute: 0.1 10*3/uL (ref 0.0–0.5)
Eosinophils Relative: 2 %
HCT: 46.5 % (ref 39.0–52.0)
Hemoglobin: 14.9 g/dL (ref 13.0–17.0)
Immature Granulocytes: 1 %
Lymphocytes Relative: 51 %
Lymphs Abs: 4.7 10*3/uL — ABNORMAL HIGH (ref 0.7–4.0)
MCH: 27.2 pg (ref 26.0–34.0)
MCHC: 32 g/dL (ref 30.0–36.0)
MCV: 84.9 fL (ref 80.0–100.0)
Monocytes Absolute: 0.8 10*3/uL (ref 0.1–1.0)
Monocytes Relative: 9 %
Neutro Abs: 3.2 10*3/uL (ref 1.7–7.7)
Neutrophils Relative %: 36 %
Platelets: 317 10*3/uL (ref 150–400)
RBC: 5.48 MIL/uL (ref 4.22–5.81)
RDW: 14.4 % (ref 11.5–15.5)
WBC: 8.8 10*3/uL (ref 4.0–10.5)
nRBC: 0 % (ref 0.0–0.2)

## 2023-04-05 LAB — HEMOGLOBIN AND HEMATOCRIT, BLOOD
HCT: 39.6 % (ref 39.0–52.0)
HCT: 41.7 % (ref 39.0–52.0)
HCT: 45.3 % (ref 39.0–52.0)
Hemoglobin: 12.9 g/dL — ABNORMAL LOW (ref 13.0–17.0)
Hemoglobin: 13.5 g/dL (ref 13.0–17.0)
Hemoglobin: 14.5 g/dL (ref 13.0–17.0)

## 2023-04-05 LAB — BASIC METABOLIC PANEL
Anion gap: 8 (ref 5–15)
BUN: 14 mg/dL (ref 8–23)
CO2: 26 mmol/L (ref 22–32)
Calcium: 8.9 mg/dL (ref 8.9–10.3)
Chloride: 103 mmol/L (ref 98–111)
Creatinine, Ser: 0.79 mg/dL (ref 0.61–1.24)
GFR, Estimated: >60 mL/min (ref >60)
Glucose, Bld: 173 mg/dL — ABNORMAL HIGH (ref 70–99)
Potassium: 4 mmol/L (ref 3.5–5.1)
Sodium: 137 mmol/L (ref 135–145)

## 2023-04-05 LAB — TYPE AND SCREEN
ABO/RH(D): B POS
Antibody Screen: NEGATIVE

## 2023-04-05 LAB — PROTIME-INR
INR: 1 (ref 0.8–1.2)
Prothrombin Time: 13.5 seconds (ref 11.4–15.2)

## 2023-04-05 LAB — HEMOGLOBIN A1C
Hgb A1c MFr Bld: 7 % — ABNORMAL HIGH (ref 4.8–5.6)
Mean Plasma Glucose: 154 mg/dL

## 2023-04-05 LAB — GLUCOSE, CAPILLARY
Glucose-Capillary: 139 mg/dL — ABNORMAL HIGH (ref 70–99)
Glucose-Capillary: 166 mg/dL — ABNORMAL HIGH (ref 70–99)
Glucose-Capillary: 197 mg/dL — ABNORMAL HIGH (ref 70–99)
Glucose-Capillary: 103 mg/dL — ABNORMAL HIGH (ref 70–99)

## 2023-04-05 LAB — APTT: aPTT: 31 seconds (ref 24–36)

## 2023-04-05 MED ORDER — INSULIN ASPART 100 UNIT/ML IJ SOLN
0.0000 [IU] | INTRAMUSCULAR | Status: DC
  Administered 2023-04-05: 2 [IU] via SUBCUTANEOUS
  Administered 2023-04-05: 1 [IU] via SUBCUTANEOUS
  Administered 2023-04-05 – 2023-04-06 (×4): 2 [IU] via SUBCUTANEOUS
  Administered 2023-04-06: 1 [IU] via SUBCUTANEOUS
  Filled 2023-04-05 (×7): qty 1

## 2023-04-05 MED ORDER — ONDANSETRON HCL 4 MG PO TABS: 4.0000 mg | ORAL_TABLET | Freq: Four times a day (QID) | ORAL | Status: DC | PRN

## 2023-04-05 MED ORDER — IOHEXOL 350 MG/ML SOLN
100.0000 mL | Freq: Once | INTRAVENOUS | Status: AC | PRN
  Administered 2023-04-05: 100 mL via INTRAVENOUS

## 2023-04-05 MED ORDER — DEXTROSE-NACL 5-0.45 % IV SOLN
INTRAVENOUS | Status: DC
Start: 2023-04-05 — End: 2023-04-06

## 2023-04-05 MED ORDER — POLYETHYLENE GLYCOL 3350 17 GM/SCOOP PO POWD
1.0000 | Freq: Once | ORAL | Status: AC
  Administered 2023-04-05: 255 g via ORAL
  Filled 2023-04-05: qty 255

## 2023-04-05 MED ORDER — ONDANSETRON HCL 4 MG/2ML IJ SOLN: 4.0000 mg | Freq: Four times a day (QID) | INTRAMUSCULAR | Status: DC | PRN

## 2023-04-05 NOTE — H&P (Signed)
History and Physical    Patient: Evan [name] LDJ:[id number] DOB: [birth year] DOA: 04/05/2023 DOS: the patient was seen and examined on 04/05/2023 PCP: Mick Sell, MD  Patient coming from: Home  Chief Complaint:  Chief Complaint  Patient presents with   Rectal Bleeding   HPI: [name] is a 78 y.o. male with medical history significant of diverticular bleeding status post sigmoidectomy 2018, COPD, CHF, type 2 diabetes, hypertension presenting with GI bleeding.  Patient reports having some generalized lower abdominal discomfort earlier this morning when patient had a very large bloody bowel movement.  Patient denies any nausea prior to episode.  Has remote history of significant GI bleeding several years ago that required colectomy for treatment.  Takes baby aspirin daily.  Denies any NSAID use.  Denies any strenuous activity.  No reports of change in diet.  No chest pain or shortness of breath.  No hemiparesis or confusion.  Patient subsequently came to the ER for further evaluation given the bloody bowel movements. Presented to the ER afebrile, hemodynamically stable.  White count 9, hemoglobin 15, creatinine 0.8.  CT angio GI bleed stable for any acute bleeding. Review of Systems: As mentioned in the history of present illness. All other systems reviewed and are negative. Past Medical History:  Diagnosis Date   Adenomatous polyp of colon    Arthritis    xDJD   Atrial fibrillation (HCC)    BPH (benign prostatic hyperplasia)    CHF (congestive heart failure) (HCC)    COPD (chronic obstructive pulmonary disease) (HCC)    Diabetes mellitus without complication (HCC)    type 2   Discitis    Diverticulitis    Diverticulosis    DVT (deep venous thrombosis) (HCC)    bilateral tibial   Erectile dysfunction    Hearing loss    Hyperlipidemia    Hypertension    Ischemic optic neuropathy    Lower GI bleeding    Lumbar degenerative disc disease    Non-ischemic  cardiomyopathy (HCC)    Sleep apnea    Past Surgical History:  Procedure Laterality Date   ATRIAL ABLATION SURGERY  2010   CATARACT EXTRACTION W/PHACO Right 10/10/2020   Procedure: CATARACT EXTRACTION PHACO AND INTRAOCULAR LENS PLACEMENT (IOC) RIGHT ISTENT INJ DIABETIC 2.64  00:29.4;  Surgeon: Nevada Crane, MD;  Location: [facility];  Service: Ophthalmology;  Laterality: Right;  Diabetic - oral meds   CATARACT EXTRACTION W/PHACO Left 03/06/2021   Procedure: CATARACT EXTRACTION PHACO AND INTRAOCULAR LENS PLACEMENT (IOC) LEFT DIABETIC ISTENT INJ 3.62 00:30.6;  Surgeon: Nevada Crane, MD;  Location: [facility];  Service: Ophthalmology;  Laterality: Left;  Diabetic - oral meds   CHOLECYSTECTOMY  2011   COLONOSCOPY Left 02/24/2018   Procedure: COLONOSCOPY;  Surgeon: Pasty Spillers, MD;  Location: [facility] ENDOSCOPY;  Service: Endoscopy;  Laterality: Left;   COLONOSCOPY     2008, 2010, 2013   COLONOSCOPY WITH PROPOFOL N/A 09/25/2017   Procedure: COLONOSCOPY WITH PROPOFOL;  Surgeon: Scot Jun, MD;  Location: [facility];  Service: Endoscopy;  Laterality: N/A;   COLONOSCOPY WITH PROPOFOL N/A 02/18/2018   Procedure: COLONOSCOPY WITH PROPOFOL;  Surgeon: Midge Minium, MD;  Location: [facility];  Service: Endoscopy;  Laterality: N/A;   ERCP  2011   ESOPHAGOGASTRODUODENOSCOPY Left 02/23/2018   Procedure: ESOPHAGOGASTRODUODENOSCOPY (EGD);  Surgeon: Pasty Spillers, MD;  Location: [facility];  Service: Endoscopy;  Laterality: Left;   ESOPHAGOGASTRODUODENOSCOPY  2014   IVC FILTER INSERTION  N/A 04/17/2022   Procedure: IVC FILTER INSERTION;  Surgeon: Renford DillsSchnier, Gregory G, MD;  Location: [facility];  Service: Cardiovascular;  Laterality: N/A;   IVC FILTER REMOVAL N/A 07/31/2022   Procedure: IVC FILTER REMOVAL;  Surgeon: Renford DillsSchnier, Gregory G, MD;  Location: [facility];  Service: Cardiovascular;  Laterality: N/A;   LAPAROTOMY  01/01/2017    Procedure: EXPLORATORY LAPAROTOMY;  Surgeon: Henrene DodgeJose Piscoya, MD;  Location: [facility] ORS;  Service: General;;   PARTIAL COLECTOMY N/A 01/01/2017   Procedure: PARTIAL COLECTOMY;  Surgeon: Henrene DodgeJose Piscoya, MD;  Location: [facility] ORS;  Service: General;  Laterality: N/A;   PERIPHERAL VASCULAR CATHETERIZATION N/A 12/28/2016   Procedure: Visceral Artery Intervention;  Surgeon: Annice NeedyJason S Dew, MD;  Location: [facility];  Service: Cardiovascular;  Laterality: N/A;   SECONDARY CLOSURE OF WOUND N/A 01/08/2017   Procedure: SECONDARY CLOSURE FOR EVISCERATION;  Surgeon: Lattie Hawichard E Cooper, MD;  Location: [facility] ORS;  Service: General;  Laterality: N/A;   SIGMOIDECTOMY  2018   TOTAL HIP ARTHROPLASTY Right 04/24/2022   Procedure: TOTAL HIP ARTHROPLASTY ANTERIOR APPROACH;  Surgeon: Kennedy BuckerMenz, Michael, MD;  Location: [facility] ORS;  Service: Orthopedics;  Laterality: Right;   TOTAL KNEE ARTHROPLASTY Right 03/24/2014   times 4   Social History:  reports that he quit smoking about 29 years ago. His smoking use included cigarettes. He has a 7.25 pack-year smoking history. He has never used smokeless tobacco. He reports that he does not drink alcohol and does not use drugs.  Allergies  Allergen Reactions   Azithromycin Rash   Penicillins Rash    Has patient had a PCN reaction causing immediate rash, facial/tongue/throat swelling, SOB or lightheadedness with hypotension: Yes Has patient had a PCN reaction causing severe rash involving mucus membranes or skin necrosis: Yes Has patient had a PCN reaction that required hospitalization No Has patient had a PCN reaction occurring within the last 10 years: No If all of the above answers are "NO", then may proceed with Cephalosporin use.      Lisinopril Cough   Losartan Cough   Metoprolol Other (See Comments)    Bradycardia.    Family History  Problem Relation Age of Onset   Diabetes Sister    Ovarian cancer Sister    Diabetes Sister    Diabetes Brother    Heart attack  Brother    Diabetes Brother     Prior to Admission medications   Medication Sig Start Date End Date Taking? Authorizing Provider  amLODipine (NORVASC) 10 MG tablet Take 10 mg by mouth daily.   Yes [provider]  aspirin EC 81 MG EC tablet Take 1 tablet (81 mg total) by mouth daily. 01/16/17  Yes Loflin, Laney Potashatherine L, MD  Cholecalciferol (VITAMIN D3) 2000 units capsule Take 2,000 Units by mouth daily.   Yes [provider]  hydrOXYzine (ATARAX/VISTARIL) 25 MG tablet Take 25 mg by mouth at bedtime as needed (sleep).   Yes [provider]  metFORMIN (GLUCOPHAGE-XR) 500 MG 24 hr tablet 1,000 mg daily with breakfast.  08/30/17  Yes [provider]  Multiple Vitamins-Minerals (CENTRUM SILVER PO) Take 1 tablet by mouth every morning.   Yes [provider]  pravastatin (PRAVACHOL) 40 MG tablet Take 40 mg by mouth at bedtime. 05/03/16  Yes [provider]  vitamin B-12 (CYANOCOBALAMIN) 1000 MCG tablet Take 1,000 mcg by mouth daily.   Yes [provider]  acetaminophen (TYLENOL) 500 MG tablet Take 1,000 mg by mouth every 8 (  eight) hours as needed.    [provider]  albuterol (VENTOLIN HFA) 108 (90 Base) MCG/ACT inhaler SMARTSIG:2 inhalation Via Inhaler Every 6 Hours PRN 07/25/22   [provider]  polyethylene glycol (MIRALAX / GLYCOLAX) packet Take 17 g by mouth daily. Patient taking differently: Take 17 g by mouth daily as needed for mild constipation. 02/25/18   Ramonita Lab, MD    Physical Exam: Vitals:   04/05/23 0747 04/05/23 0830 04/05/23 1145 04/05/23 1215  BP: (!) 159/90 (!) 144/69  (!) 121/59  Pulse: (!) 104 96 83 76  Resp: 18 18 20 16   Temp: 98.7 F (37.1 C)  98.7 F (37.1 C)   TempSrc: Oral  Oral   SpO2: 96% 95% 94% 94%  Weight: 129.5 kg     Height: 6\' 3"  (1.905 m)      Physical Exam Constitutional:      Appearance: He is obese.  HENT:     Head: Normocephalic and atraumatic.     Nose: Nose normal.   Eyes:     Pupils: Pupils are equal, round, and reactive to light.  Cardiovascular:     Rate and Rhythm: Normal rate and regular rhythm.  Pulmonary:     Effort: Pulmonary effort is normal.  Abdominal:     General: Bowel sounds are normal.  Musculoskeletal:        General: Normal range of motion.  Skin:    General: Skin is warm.  Neurological:     General: No focal deficit present.  Psychiatric:        Mood and Affect: Mood normal.     Data Reviewed:  There are no new results to review at this time. CT ANGIO GI BLEED CLINICAL DATA:  GI bleed  EXAM: CTA ABDOMEN AND PELVIS WITHOUT AND WITH CONTRAST  TECHNIQUE: Multidetector CT imaging of the abdomen and pelvis was performed using the standard protocol during bolus administration of intravenous contrast. Multiplanar reconstructed images and MIPs were obtained and reviewed to evaluate the vascular anatomy.  RADIATION DOSE REDUCTION: This exam was performed according to the departmental dose-optimization program which includes automated exposure control, adjustment of the mA and/or kV according to patient size and/or use of iterative reconstruction technique.  CONTRAST:  OMNIPAQUE IOHEXOL 350 MG/ML SOLN  COMPARISON:  None Available.  FINDINGS: VASCULAR  Aorta: Normal caliber aorta without aneurysm, dissection, vasculitis or significant stenosis.  Celiac: Patent without evidence of aneurysm, dissection, vasculitis or significant stenosis.  SMA: Patent without evidence of aneurysm, dissection, vasculitis or significant stenosis.  Renals: Both renal arteries are patent without evidence of aneurysm, dissection, vasculitis, fibromuscular dysplasia or significant stenosis.  IMA: Patent without evidence of aneurysm, dissection, vasculitis or significant stenosis.  Inflow: Patent without evidence of aneurysm, dissection, vasculitis or significant stenosis.  Proximal Outflow: Bilateral common femoral and  visualized portions of the superficial and profunda femoral arteries are patent without evidence of aneurysm, dissection, vasculitis or significant stenosis.  Veins: No obvious venous abnormality within the limitations of this arterial phase study.  Review of the MIP images confirms the above findings.  NON-VASCULAR  Lower chest: Lung bases clear.  No pericardial or pleural effusion  Hepatobiliary: No focal liver abnormality is seen. Status post cholecystectomy. No biliary dilatation.  Pancreas: Unremarkable. No pancreatic ductal dilatation or surrounding inflammatory changes.  Spleen: Normal in size without focal abnormality.  Adrenals/Urinary Tract: No adrenal lesions. 3 cm cyst left kidney upper pole that does not need to be followed. No nephrolithiasis or hydronephrosis.  Unremarkable urinary bladder.  Stomach/Bowel: Stomach is within normal limits. Appendix appears normal. No evidence of bowel wall thickening, distention, or inflammatory changes.  Lymphatic: Aortic atherosclerosis. No enlarged abdominal or pelvic lymph nodes.  Reproductive: Prostate is unremarkable.  Other: No abdominal wall hernia or abnormality. No abdominopelvic ascites.  Musculoskeletal: Right hip prosthesis. Thoracolumbosacral degenerative changes.  IMPRESSION: VASCULAR  No aneurysm, dissection or occlusion.  NON-VASCULAR  1. No acute abdominal or pelvic pathology. 2. No evidence of active hemorrhage during contrast administration. 3. There is a left kidney cyst. 4. There are atheromatous changes.  Electronically Signed   By: Layla Maw M.D.   On: 04/05/2023 09:26  Lab Results  Component Value Date   WBC 8.8 04/05/2023   HGB 14.5 04/05/2023   HCT 45.3 04/05/2023   MCV 84.9 04/05/2023   PLT 317 04/05/2023   Last metabolic panel Lab Results  Component Value Date   GLUCOSE 173 (H) 04/05/2023   NA 137 04/05/2023   K 4.0 04/05/2023   CL 103 04/05/2023   CO2 26  04/05/2023   BUN 14 04/05/2023   CREATININE 0.79 04/05/2023   GFRNONAA >60 04/05/2023   CALCIUM 8.9 04/05/2023   PROT 7.6 04/12/2022   ALBUMIN 4.1 04/12/2022   BILITOT 1.4 (H) 04/12/2022   ALKPHOS 78 04/12/2022   AST 16 04/12/2022   ALT 18 04/12/2022   ANIONGAP 8 04/05/2023    Assessment and Plan: * GIB (gastrointestinal bleeding) Bloody bowel movements x 2-3 since this morning in the setting of noted prior diverticular bleeding in the past Status post sigmoidectomy in 2018 for similar issues Hemoglobin stable at 15 On baby aspirin Hold offending medication Trend hemoglobin Consult GI for formal evaluation Follow  CHF (congestive heart failure) 2D echo February 2019 with EF of 50-55% Euvolemic on exam Monitor volume status  Stage 2 moderate COPD by GOLD classification Stable from a respiratory standpoint Continue home inhalers  Hypertension BP stable Titrate home regimen   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 Norma Fredrickson   Family Communication: Wife at the bedside   Severity of Illness: The appropriate patient status for this patient is OBSERVATION. Observation status is judged to be reasonable and necessary in order to provide the required intensity of service to ensure the patient's safety. The patient's presenting symptoms, physical exam findings, and initial radiographic and laboratory data in the context of their medical condition is felt to place them at decreased risk for further clinical deterioration. Furthermore, it is anticipated that the patient will be medically stable for discharge from the hospital within 2 midnights of admission.   Author: Floydene Flock, MD 04/05/2023 12:24 PM  For on call review [email or web address].

## 2023-04-05 NOTE — Assessment & Plan Note (Signed)
Sliding scale insulin 

## 2023-04-05 NOTE — Assessment & Plan Note (Signed)
Bloody bowel movements x 2-3 since this morning in the setting of noted prior diverticular bleeding in the past Status post sigmoidectomy in 2018 for similar issues Hemoglobin stable at 15 On baby aspirin Hold offending medication Trend hemoglobin Consult GI for formal evaluation Follow

## 2023-04-05 NOTE — H&P (View-Only) (Signed)
  GI Inpatient Consult Note  Reason for Consult: LGI Bleed   Attending Requesting Consult: Dr. Steven Newton, MD  History of Present Illness: Evan [name] is a 77 y.o. male seen for evaluation of acute LGI bleeding at the request of admitting hospitalist - Dr. Steven Newton. Patient has a PMH of HTN, HLD, COPD, T2DM, GERD, aortic atherosclerosis, chronic insomnia, obesity, paroxsymal atrial fibrillation s/p ablation, BPH, CHF, and OSA on CPAP. He presented to the [facility] ED via personal vehicle from home for chief complaint of 2 episodes of painless hematochezia. He reports within 30 minutes of waking up this morning he had two episodes of bright red blood per rectum and blood in his stool. He immediately came to the ED. Upon presentation to the ED, he was hypertensive 159/90, tachycardic 104, and otherwise normal vital signs. Labs were significant for hemoglobin 14.9, BUN 14, serum creatinine 0.79, and INR 1.0. He had CTA abd/pelvis w/wo contrast performed which was negative for any acute GI bleeding or acute findings. He was admitted for observation and GI consulted for further evaluation and management.   Patient seen and examined this afternoon resting comfortably in hospital bed. He has had two further episodes (four total) today of painless hematochezia in the ED. Last episode was around noon today. There is no associated pain or abdominal cramping. He denies any significant change in his bowel habits. He denies any melanotic stool. He denies any recent medication changes or antibiotic use. He has mild diarrhea at baseline which is suspected 2/2 Metformin use. He denies any significant constipation. 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 He has undergone previous embolization for diverticular bleeding 2017 as well as partial colectomy in 2018 for recurrent diverticular bleeding. Last colonoscopy 01/2018 performed by Dr. Tahiliani showed diverticulosis in sigmoid and  ascending colon, no evidence of old or new blood seen throughout the colon, and otherwise normal examined colon.    Past Medical History:  Past Medical History:  Diagnosis Date   Adenomatous polyp of colon    Arthritis    xDJD   Atrial fibrillation (HCC)    BPH (benign prostatic hyperplasia)    CHF (congestive heart failure) (HCC)    COPD (chronic obstructive pulmonary disease) (HCC)    Diabetes mellitus without complication (HCC)    type 2   Discitis    Diverticulitis    Diverticulosis    DVT (deep venous thrombosis) (HCC)    bilateral tibial   Erectile dysfunction    Hearing loss    Hyperlipidemia    Hypertension    Ischemic optic neuropathy    Lower GI bleeding    Lumbar degenerative disc disease    Non-ischemic cardiomyopathy (HCC)    Sleep apnea     Problem List: Patient Active Problem List   Diagnosis Date Noted   CHF (congestive heart failure) 04/05/2023   Sensorineural hearing loss, bilateral 06/21/2022   Encounter for fitting and adjustment of hearing aid 06/21/2022   Status post total hip replacement, right 04/24/2022   Severe obesity (BMI 35.0-39.9) with comorbidity 05/11/2020   Iron deficiency anemia 03/07/2018   Chronic insomnia 02/27/2018   Diverticulosis of large intestine without diverticulitis    GIB (gastrointestinal bleeding) 02/23/2018   Near syncope 02/22/2018   Hematochezia 02/16/2018   Acute deep vein thrombosis (DVT) of popliteal vein of right lower extremity 01/24/2017   Ischemic optic neuropathy of left eye 01/24/2017   DVT (deep venous thrombosis) 01/22/2017   History   of deep vein thrombosis (DVT) of lower extremity 01/22/2017   S/P partial colectomy 01/21/2017   Acute posthemorrhagic anemia 01/06/2017   Encounter for blood transfusion 01/06/2017   Leukocytosis 01/06/2017   Hypokalemia 01/06/2017   History of GI diverticular bleed    BRBPR (bright red blood per rectum) 12/27/2016   Stage 2 moderate COPD by GOLD classification  05/03/2016   High risk medication use 05/04/2015   Sleep apnea 05/04/2015   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 11/07/2014   Hearing loss 11/07/2014   Hypercholesteremia 11/07/2014   Hypertension 11/07/2014   Lumbar degenerative disc disease 11/07/2014   Proteinuria due to type 2 diabetes mellitus 11/07/2014   Arthrosis of knee 06/22/2014   Degenerative joint disease of knee, left 05/06/2014   Status post total right knee replacement 05/06/2014   Aortic atherosclerosis 05/05/2010   History of adenomatous polyp of colon 01/31/2007    Past Surgical History: Past Surgical History:  Procedure Laterality Date   ATRIAL ABLATION SURGERY  2010   CATARACT EXTRACTION W/PHACO Right 10/10/2020   Procedure: CATARACT EXTRACTION PHACO AND INTRAOCULAR LENS PLACEMENT (IOC) RIGHT ISTENT INJ DIABETIC 2.64  00:29.4;  Surgeon: Nevada Crane, MD;  Location: [facility];  Service: Ophthalmology;  Laterality: Right;  Diabetic - oral meds   CATARACT EXTRACTION W/PHACO Left 03/06/2021   Procedure: CATARACT EXTRACTION PHACO AND INTRAOCULAR LENS PLACEMENT (IOC) LEFT DIABETIC ISTENT INJ 3.62 00:30.6;  Surgeon: Nevada Crane, MD;  Location: [facility];  Service: Ophthalmology;  Laterality: Left;  Diabetic - oral meds   CHOLECYSTECTOMY  2011   COLONOSCOPY Left 02/24/2018   Procedure: COLONOSCOPY;  Surgeon: Pasty Spillers, MD;  Location: [facility] END[facility];  Service: Endoscopy;  Laterality: Left;   COLONOSCOPY     2008, 2010, 2013   COLONOSCOPY WITH PROPOFOL N/A 09/25/2017   Procedure: COLONOSCOPY WITH PROPOFOL;  Surgeon: Scot Jun, MD;  Location: [facility] ENDOSCOPY;  Service: Endoscopy;  Laterality: N/A;   COLONOSCOPY WITH PROPOFOL N/A 02/18/2018   Procedure: COLONOSCOPY WITH PROPOFOL;  Surgeon: Midge Minium, MD;  Location: [facility];  Service: Endoscopy;  Laterality: N/A;   ERCP  2011   ESOPHAGOGASTRODUODENOSCOPY Left 02/23/2018    Procedure: ESOPHAGOGASTRODUODENOSCOPY (EGD);  Surgeon: Pasty Spillers, MD;  Location: [facility] ENDOSCOPY;  Service: Endoscopy;  Laterality: Left;   ESOPHAGOGASTRODUODENOSCOPY  2014   IVC FILTER INSERTION N/A 04/17/2022   Procedure: IVC FILTER INSERTION;  Surgeon: Renford Dills, MD;  Location: [facility] INV[facility];  Service: Cardiovascular;  Laterality: N/A;   IVC FILTER REMOVAL N/A 07/31/2022   Procedure: IVC FILTER REMOVAL;  Surgeon: Renford Dills, MD;  Location: [facility];  Service: Cardiovascular;  Laterality: N/A;   LAPAROTOMY  01/01/2017   Procedure: EXPLORATORY LAPAROTOMY;  Surgeon: Henrene Dodge, MD;  Location: [facility] ORS;  Service: General;;   PARTIAL COLECTOMY N/A 01/01/2017   Procedure: PARTIAL COLECTOMY;  Surgeon: Henrene Dodge, MD;  Location: [facility] ORS;  Service: General;  Laterality: N/A;   PERIPHERAL VASCULAR CATHETERIZATION N/A 12/28/2016   Procedure: Visceral Artery Intervention;  Surgeon: Annice Needy, MD;  Location: [facility] CV [facility];  Service: Cardiovascular;  Laterality: N/A;   SECONDARY CLOSURE OF WOUND N/A 01/08/2017   Procedure: SECONDARY CLOSURE FOR EVISCERATION;  Surgeon: Lattie Haw, MD;  Location: [facility] ORS;  Service: General;  Laterality: N/A;   SIGMOIDECTOMY  2018   TOTAL HIP ARTHROPLASTY Right 04/24/2022   Procedure: TOTAL HIP ARTHROPLASTY ANTERIOR APPROACH;  Surgeon: Kennedy Bucker, MD;  Location: [facility] ORS;  Service: Orthopedics;  Laterality: Right;   TOTAL KNEE ARTHROPLASTY Right 03/24/2014   times 4    Allergies: Allergies  Allergen Reactions   Azithromycin Rash   Penicillins Rash    Has patient had a PCN reaction causing immediate rash, facial/tongue/throat swelling, SOB or lightheadedness with hypotension: Yes Has patient had a PCN reaction causing severe rash involving mucus membranes or skin necrosis: Yes Has patient had a PCN reaction that required hospitalization No Has patient had a PCN reaction occurring within the last 10 years:  No If all of the above answers are "NO", then may proceed with Cephalosporin use.      Lisinopril Cough   Losartan Cough   Metoprolol Other (See Comments)    Bradycardia.    Home Medications: Medications Prior to Admission  Medication Sig Dispense Refill Last Dose   amLODipine (NORVASC) 10 MG tablet Take 10 mg by mouth daily.   04/04/2023   aspirin EC 81 MG EC tablet Take 1 tablet (81 mg total) by mouth daily. 30 tablet 5 04/04/2023   Cholecalciferol (VITAMIN D3) 2000 units capsule Take 2,000 Units by mouth daily.   04/04/2023   hydrOXYzine (ATARAX/VISTARIL) 25 MG tablet Take 25 mg by mouth at bedtime as needed (sleep).   04/04/2023   metFORMIN (GLUCOPHAGE-XR) 500 MG 24 hr tablet 1,000 mg daily with breakfast.    04/04/2023   Multiple Vitamins-Minerals (CENTRUM SILVER PO) Take 1 tablet by mouth every morning.   04/04/2023   pravastatin (PRAVACHOL) 40 MG tablet Take 40 mg by mouth at bedtime.   04/04/2023   vitamin B-12 (CYANOCOBALAMIN) 1000 MCG tablet Take 1,000 mcg by mouth daily.   04/04/2023   acetaminophen (TYLENOL) 500 MG tablet Take 1,000 mg by mouth every 8 (eight) hours as needed.   prn at prn   albuterol (VENTOLIN HFA) 108 (90 Base) MCG/ACT inhaler SMARTSIG:2 inhalation Via Inhaler Every 6 Hours PRN   prn at prn   polyethylene glycol (MIRALAX / GLYCOLAX) packet Take 17 g by mouth daily. (Patient taking differently: Take 17 g by mouth daily as needed for mild constipation.) 14 each 0 prn at prn   Home medication reconciliation was completed with the patient.   Scheduled Inpatient Medications:    insulin aspart  0-9 Units Subcutaneous Q4H    Continuous Inpatient Infusions:    dextrose 5 % and 0.45% NaCl      PRN Inpatient Medications:  ondansetron **OR** ondansetron (ZOFRAN) IV  Family History: family history includes Diabetes in his brother, brother, sister, and sister; Heart attack in his brother; Ovarian cancer in his sister.  The patient's family history is negative for  inflammatory bowel disorders, GI malignancy, or solid organ transplantation.  Social History:   reports that he quit smoking about 29 years ago. His smoking use included cigarettes. He has a 7.25 pack-year smoking history. He has never used smokeless tobacco. He reports that he does not drink alcohol and does not use drugs. The patient denies ETOH, tobacco, or drug use.   Review of Systems: Constitutional: Weight is stable.  Eyes: No changes in vision. ENT: No oral lesions, sore throat.  GI: see HPI.  Heme/Lymph: No easy bruising.  CV: No chest pain.  GU: No hematuria.  Integumentary: No rashes.  Neuro: No headaches.  Psych: No depression/anxiety.  Endocrine: No heat/cold intolerance.  Allergic/Immunologic: No urticaria.  Resp: No cough, SOB.  Musculoskeletal: No joint swelling.    Physical Examination: BP (!) 121/59   Pulse  76   Temp 98.7 F (37.1 C) (Oral)   Resp 16   Ht 6\' 3"  (1.905 m)   Wt 129.5 kg   SpO2 94%   BMI 35.68 kg/m  Gen: NAD, alert and oriented x 4 HEENT: PEERLA, EOMI, Neck: supple, no JVD or thyromegaly Chest: CTA bilaterally, no wheezes, crackles, or other adventitious sounds CV: RRR, no m/g/c/r Abd: soft, NT, ND, +BS in all four quadrants; no HSM, guarding, ridigity, or rebound tenderness Ext: no edema, well perfused with 2+ pulses, Skin: no rash or lesions noted Lymph: no LAD  Data: Lab Results  Component Value Date   WBC 8.8 04/05/2023   HGB 14.5 04/05/2023   HCT 45.3 04/05/2023   MCV 84.9 04/05/2023   PLT 317 04/05/2023   Recent Labs  Lab 04/05/23 0752 04/05/23 1208  HGB 14.9 14.5   Lab Results  Component Value Date   NA 137 04/05/2023   K 4.0 04/05/2023   CL 103 04/05/2023   CO2 26 04/05/2023   BUN 14 04/05/2023   CREATININE 0.79 04/05/2023   Lab Results  Component Value Date   ALT 18 04/12/2022   AST 16 04/12/2022   ALKPHOS 78 04/12/2022   BILITOT 1.4 (H) 04/12/2022   Recent Labs  Lab 04/05/23 0752  APTT 31  INR 1.0    CTA abd/pelvis w/wo contrast 04/05/2023: IMPRESSION: VASCULAR   No aneurysm, dissection or occlusion.   NON-VASCULAR   1. No acute abdominal or pelvic pathology. 2. No evidence of active hemorrhage during contrast administration. 3. There is a left kidney cyst. 4. There are atheromatous changes.  Assessment/Plan:  78 y/o male with a PMH of HTN, HLD, COPD, T2DM, GERD, aortic atherosclerosis, chronic insomnia, obesity, paroxsymal atrial fibrillation s/p ablation, BPH, CHF, and OSA on CPAP [facility] from home for chief complaint of multiple episodes of painless hematochezia  Lower GI bleeding/Painless hematochezia - H&H stable currently with no overt gastrointestinal bleeding. DDx includes diverticular bleed most likely given history and clinical presentation and also consider colon polyps, neoplasm, AVMs, colitis, anal outlet etiology, etc.   History of diverticular bleed   History of partial colectomy 2018 2/2 recurrent diverticular bleeding  COPD  OSA on CPAP  Recommendations:  - Continue to monitor serial H&H. Transfuse for Hgb <7.0.  - Maintain 2 large bore IVs for access - Continue supportive care  - Avoid NSAIDs - Continue to monitor for signs of GI bleeding - Plan for colonoscopy tomorrow morning with Dr. Norma Fredricksonoledo for luminal evaluation - If he has active GI bleeding, recommend NM GI Bleeding Scan - Further recommendations after colonoscopy  I reviewed the risks (including bleeding, perforation, infection, anesthesia complications, cardiac/respiratory complications), benefits and alternatives of colonoscopy. Patient consents to proceed.    Thank you for the consult. Please call with questions or concerns.  Gilda CreaseGranville M Arelly Whittenberg, PA-C [facility] [phone]

## 2023-04-05 NOTE — ED Provider Notes (Signed)
[facility] Provider Note    Event Date/Time   First MD Initiated Contact with Patient 04/05/23 [phone]     (approximate)   History   Rectal Bleeding   HPI  Evan [name] is a 78 y.o. male  who presents to the emergency department today because of concern for GI bleed. The patient states that when he woke up this morning he felt uncomfortable in his stomach. He then had a bloody bowel movement. Reddish blood. He has had now a couple of bloody bowel movements. States he has a history of diverticulosis and required having some of his colon removed because of it. This was a number of years ago and he has not had any issues until today. He denies any pain. Denies any weakness or shortness of breath.      Physical Exam   Triage Vital Signs: ED Triage Vitals [04/05/23 0747]  Enc Vitals Group     BP (!) 159/90     Pulse Rate (!) 104     Resp 18     Temp 98.7 F (37.1 C)     Temp Source Oral     SpO2 96 %     Weight 285 lb 7.9 oz (129.5 kg)     Height 6\' 3"  (1.905 m)     Head Circumference      Peak Flow      Pain Score 0     Pain Loc      Pain Edu?      Excl. in GC?     Most recent vital signs: Vitals:   04/05/23 0747  BP: (!) 159/90  Pulse: (!) 104  Resp: 18  Temp: 98.7 F (37.1 C)  SpO2: 96%   General: Awake, alert, oriented. CV:  Good peripheral perfusion. Tachycardia. Resp:  Normal effort. Lungs clear. Abd:  No distention.    ED Results / Procedures / Treatments   Labs (all labs ordered are listed, but only abnormal results are displayed) Labs Reviewed  CBC WITH DIFFERENTIAL/PLATELET - Abnormal; Notable for the following components:      Result Value   Lymphs Abs 4.7 (*)    All other components within normal limits  BASIC METABOLIC PANEL - Abnormal; Notable for the following components:   Glucose, Bld 173 (*)    All other components within normal limits  PROTIME-INR  APTT  TYPE AND SCREEN     EKG  I, Phineas Semen,  attending physician, personally viewed and interpreted this EKG  EKG Time: 0750 Rate: 104 Rhythm: sinus tachycardia Axis: normal Intervals: qtc 440 QRS: narrow ST changes: no st elevation Impression: abnormal ekg   RADIOLOGY I independently interpreted and visualized the CT angio gi bleed. My interpretation: No large active bleed Radiology interpretation:  IMPRESSION:  VASCULAR    No aneurysm, dissection or occlusion.    NON-VASCULAR    1. No acute abdominal or pelvic pathology.  2. No evidence of active hemorrhage during contrast administration.  3. There is a left kidney cyst.  4. There are atheromatous changes.     PROCEDURES:  Critical Care performed: No    MEDICATIONS ORDERED IN ED: Medications - No data to display   IMPRESSION / MDM / ASSESSMENT AND PLAN / ED COURSE  I reviewed the triage vital signs and the nursing notes.  Differential diagnosis includes, but is not limited to, hemorrhoid, AVM, neoplasm, diverticular disease.  Patient's presentation is most consistent with acute presentation with potential threat to life or bodily function.   The patient is on the cardiac monitor to evaluate for evidence of arrhythmia and/or significant heart rate changes.  Patient presents to the emergency department today because of concern for gi bleed. Patient does have history of significant diverticular disease in the past. Patient without hypotension here. Blood work without anemia. While in the emergency department the patient did have another bloody bowel movement. Given continued bloody bowel movements and history of significant bleed do feel patient would benefit from further work up and management. Discussed with Dr. Alvester Morin with the hospitalist service who will plan on admission.       FINAL CLINICAL IMPRESSION(S) / ED DIAGNOSES   Final diagnoses:  Lower GI bleed     Note:  This document was prepared using Dragon voice  recognition software and may include unintentional dictation errors.    Phineas Semen, MD 04/05/23 [phone]

## 2023-04-05 NOTE — ED Notes (Signed)
Pt presents to ED with c/o of having bloody stools this morning. Pt states stool was dark in color, pt denies blood thinners. Pt states HX of diverticulitis and states he presented the same the last time he had "flare-up". NAD noted. Pt is A&Ox4.

## 2023-04-05 NOTE — ED Notes (Signed)
Lab called to draw repeat HgB

## 2023-04-05 NOTE — Consult Note (Signed)
GI Inpatient Consult Note  Reason for Consult: LGI Bleed   Attending Requesting Consult: Dr. Doree Albee, MD  History of Present Illness: Evan [name] is a 78 y.o. male seen for evaluation of acute LGI bleeding at the request of admitting hospitalist - Dr. Doree Albee. Patient has a PMH of HTN, HLD, COPD, T2DM, GERD, aortic atherosclerosis, chronic insomnia, obesity, paroxsymal atrial fibrillation s/p ablation, BPH, CHF, and OSA on CPAP. He presented to the [facility] ED via personal vehicle from home for chief complaint of 2 episodes of painless hematochezia. He reports within 30 minutes of waking up this morning he had two episodes of bright red blood per rectum and blood in his stool. He immediately came to the ED. Upon presentation to the ED, he was hypertensive 159/90, tachycardic 104, and otherwise normal vital signs. Labs were significant for hemoglobin 14.9, BUN 14, serum creatinine 0.79, and INR 1.0. He had CTA abd/pelvis w/wo contrast performed which was negative for any acute GI bleeding or acute findings. He was admitted for observation and GI consulted for further evaluation and management.   Patient seen and examined this afternoon resting comfortably in hospital bed. He has had two further episodes (four total) today of painless hematochezia in the ED. Last episode was around noon today. There is no associated pain or abdominal cramping. He denies any significant change in his bowel habits. He denies any melanotic stool. He denies any recent medication changes or antibiotic use. He has mild diarrhea at baseline which is suspected 2/2 Metformin use. He denies any significant constipation. 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 He has undergone previous embolization for diverticular bleeding 2017 as well as partial colectomy in 2018 for recurrent diverticular bleeding. Last colonoscopy 01/2018 performed by Dr. Maximino Greenland showed diverticulosis in sigmoid and  ascending colon, no evidence of old or new blood seen throughout the colon, and otherwise normal examined colon.    Past Medical History:  Past Medical History:  Diagnosis Date   Adenomatous polyp of colon    Arthritis    xDJD   Atrial fibrillation (HCC)    BPH (benign prostatic hyperplasia)    CHF (congestive heart failure) (HCC)    COPD (chronic obstructive pulmonary disease) (HCC)    Diabetes mellitus without complication (HCC)    type 2   Discitis    Diverticulitis    Diverticulosis    DVT (deep venous thrombosis) (HCC)    bilateral tibial   Erectile dysfunction    Hearing loss    Hyperlipidemia    Hypertension    Ischemic optic neuropathy    Lower GI bleeding    Lumbar degenerative disc disease    Non-ischemic cardiomyopathy (HCC)    Sleep apnea     Problem List: Patient Active Problem List   Diagnosis Date Noted   CHF (congestive heart failure) 04/05/2023   Sensorineural hearing loss, bilateral 06/21/2022   Encounter for fitting and adjustment of hearing aid 06/21/2022   Status post total hip replacement, right 04/24/2022   Severe obesity (BMI 35.0-39.9) with comorbidity 05/11/2020   Iron deficiency anemia 03/07/2018   Chronic insomnia 02/27/2018   Diverticulosis of large intestine without diverticulitis    GIB (gastrointestinal bleeding) 02/23/2018   Near syncope 02/22/2018   Hematochezia 02/16/2018   Acute deep vein thrombosis (DVT) of popliteal vein of right lower extremity 01/24/2017   Ischemic optic neuropathy of left eye 01/24/2017   DVT (deep venous thrombosis) 01/22/2017   History  of deep vein thrombosis (DVT) of lower extremity 01/22/2017   S/P partial colectomy 01/21/2017   Acute posthemorrhagic anemia 01/06/2017   Encounter for blood transfusion 01/06/2017   Leukocytosis 01/06/2017   Hypokalemia 01/06/2017   History of GI diverticular bleed    BRBPR (bright red blood per rectum) 12/27/2016   Stage 2 moderate COPD by GOLD classification  05/03/2016   High risk medication use 05/04/2015   Sleep apnea 05/04/2015   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 11/07/2014   Hearing loss 11/07/2014   Hypercholesteremia 11/07/2014   Hypertension 11/07/2014   Lumbar degenerative disc disease 11/07/2014   Proteinuria due to type 2 diabetes mellitus 11/07/2014   Arthrosis of knee 06/22/2014   Degenerative joint disease of knee, left 05/06/2014   Status post total right knee replacement 05/06/2014   Aortic atherosclerosis 05/05/2010   History of adenomatous polyp of colon 01/31/2007    Past Surgical History: Past Surgical History:  Procedure Laterality Date   ATRIAL ABLATION SURGERY  2010   CATARACT EXTRACTION W/PHACO Right 10/10/2020   Procedure: CATARACT EXTRACTION PHACO AND INTRAOCULAR LENS PLACEMENT (IOC) RIGHT ISTENT INJ DIABETIC 2.64  00:29.4;  Surgeon: Nevada Crane, MD;  Location: [facility];  Service: Ophthalmology;  Laterality: Right;  Diabetic - oral meds   CATARACT EXTRACTION W/PHACO Left 03/06/2021   Procedure: CATARACT EXTRACTION PHACO AND INTRAOCULAR LENS PLACEMENT (IOC) LEFT DIABETIC ISTENT INJ 3.62 00:30.6;  Surgeon: Nevada Crane, MD;  Location: [facility];  Service: Ophthalmology;  Laterality: Left;  Diabetic - oral meds   CHOLECYSTECTOMY  2011   COLONOSCOPY Left 02/24/2018   Procedure: COLONOSCOPY;  Surgeon: Pasty Spillers, MD;  Location: [facility] ENDOSCOPY;  Service: Endoscopy;  Laterality: Left;   COLONOSCOPY     2008, 2010, 2013   COLONOSCOPY WITH PROPOFOL N/A 09/25/2017   Procedure: COLONOSCOPY WITH PROPOFOL;  Surgeon: Scot Jun, MD;  Location: [facility] ENDOSCOPY;  Service: Endoscopy;  Laterality: N/A;   COLONOSCOPY WITH PROPOFOL N/A 02/18/2018   Procedure: COLONOSCOPY WITH PROPOFOL;  Surgeon: Midge Minium, MD;  Location: [facility]COPY;  Service: Endoscopy;  Laterality: N/A;   ERCP  2011   ESOPHAGOGASTRODUODENOSCOPY Left 02/23/2018    Procedure: ESOPHAGOGASTRODUODENOSCOPY (EGD);  Surgeon: Pasty Spillers, MD;  Location: [facility] ENDOSCOPY;  Service: Endoscopy;  Laterality: Left;   ESOPHAGOGASTRODUODENOSCOPY  2014   IVC FILTER INSERTION N/A 04/17/2022   Procedure: IVC FILTER INSERTION;  Surgeon: Renford Dills, MD;  Location: [facility] INVASIVE CV LAB;  Service: Cardiovascular;  Laterality: N/A;   IVC FILTER REMOVAL N/A 07/31/2022   Procedure: IVC FILTER REMOVAL;  Surgeon: Renford Dills, MD;  Location: [facility] INVASIVE CV LAB;  Service: Cardiovascular;  Laterality: N/A;   LAPAROTOMY  01/01/2017   Procedure: EXPLORATORY LAPAROTOMY;  Surgeon: Henrene Dodge, MD;  Location: [facility] ORS;  Service: General;;   PARTIAL COLECTOMY N/A 01/01/2017   Procedure: PARTIAL COLECTOMY;  Surgeon: Henrene Dodge, MD;  Location: [facility] ORS;  Service: General;  Laterality: N/A;   PERIPHERAL VASCULAR CATHETERIZATION N/A 12/28/2016   Procedure: Visceral Artery Intervention;  Surgeon: Annice Needy, MD;  Location: [facility] INVASIVE CV LAB;  Service: Cardiovascular;  Laterality: N/A;   SECONDARY CLOSURE OF WOUND N/A 01/08/2017   Procedure: SECONDARY CLOSURE FOR EVISCERATION;  Surgeon: Lattie Haw, MD;  Location: [facility] ORS;  Service: General;  Laterality: N/A;   SIGMOIDECTOMY  2018   TOTAL HIP ARTHROPLASTY Right 04/24/2022   Procedure: TOTAL HIP ARTHROPLASTY ANTERIOR APPROACH;  Surgeon: Kennedy Bucker, MD;  Location: [facility] ORS;  Service: Orthopedics;  Laterality: Right;   TOTAL KNEE ARTHROPLASTY Right 03/24/2014   times 4    Allergies: Allergies  Allergen Reactions   Azithromycin Rash   Penicillins Rash    Has patient had a PCN reaction causing immediate rash, facial/tongue/throat swelling, SOB or lightheadedness with hypotension: Yes Has patient had a PCN reaction causing severe rash involving mucus membranes or skin necrosis: Yes Has patient had a PCN reaction that required hospitalization No Has patient had a PCN reaction occurring within the last 10 years:  No If all of the above answers are "NO", then may proceed with Cephalosporin use.      Lisinopril Cough   Losartan Cough   Metoprolol Other (See Comments)    Bradycardia.    Home Medications: Medications Prior to Admission  Medication Sig Dispense Refill Last Dose   amLODipine (NORVASC) 10 MG tablet Take 10 mg by mouth daily.   04/04/2023   aspirin EC 81 MG EC tablet Take 1 tablet (81 mg total) by mouth daily. 30 tablet 5 04/04/2023   Cholecalciferol (VITAMIN D3) 2000 units capsule Take 2,000 Units by mouth daily.   04/04/2023   hydrOXYzine (ATARAX/VISTARIL) 25 MG tablet Take 25 mg by mouth at bedtime as needed (sleep).   04/04/2023   metFORMIN (GLUCOPHAGE-XR) 500 MG 24 hr tablet 1,000 mg daily with breakfast.    04/04/2023   Multiple Vitamins-Minerals (CENTRUM SILVER PO) Take 1 tablet by mouth every morning.   04/04/2023   pravastatin (PRAVACHOL) 40 MG tablet Take 40 mg by mouth at bedtime.   04/04/2023   vitamin B-12 (CYANOCOBALAMIN) 1000 MCG tablet Take 1,000 mcg by mouth daily.   04/04/2023   acetaminophen (TYLENOL) 500 MG tablet Take 1,000 mg by mouth every 8 (eight) hours as needed.   prn at prn   albuterol (VENTOLIN HFA) 108 (90 Base) MCG/ACT inhaler SMARTSIG:2 inhalation Via Inhaler Every 6 Hours PRN   prn at prn   polyethylene glycol (MIRALAX / GLYCOLAX) packet Take 17 g by mouth daily. (Patient taking differently: Take 17 g by mouth daily as needed for mild constipation.) 14 each 0 prn at prn   Home medication reconciliation was completed with the patient.   Scheduled Inpatient Medications:    insulin aspart  0-9 Units Subcutaneous Q4H    Continuous Inpatient Infusions:    dextrose 5 % and 0.45% NaCl      PRN Inpatient Medications:  ondansetron **OR** ondansetron (ZOFRAN) IV  Family History: family history includes Diabetes in his brother, brother, sister, and sister; Heart attack in his brother; Ovarian cancer in his sister.  The patient's family history is negative for  inflammatory bowel disorders, GI malignancy, or solid organ transplantation.  Social History:   reports that he quit smoking about 29 years ago. His smoking use included cigarettes. He has a 7.25 pack-year smoking history. He has never used smokeless tobacco. He reports that he does not drink alcohol and does not use drugs. The patient denies ETOH, tobacco, or drug use.   Review of Systems: Constitutional: Weight is stable.  Eyes: No changes in vision. ENT: No oral lesions, sore throat.  GI: see HPI.  Heme/Lymph: No easy bruising.  CV: No chest pain.  GU: No hematuria.  Integumentary: No rashes.  Neuro: No headaches.  Psych: No depression/anxiety.  Endocrine: No heat/cold intolerance.  Allergic/Immunologic: No urticaria.  Resp: No cough, SOB.  Musculoskeletal: No joint swelling.    Physical Examination: BP (!) 121/59   Pulse  76   Temp 98.7 F (37.1 C) (Oral)   Resp 16   Ht 6\' 3"  (1.905 m)   Wt 129.5 kg   SpO2 94%   BMI 35.68 kg/m  Gen: NAD, alert and oriented x 4 HEENT: PEERLA, EOMI, Neck: supple, no JVD or thyromegaly Chest: CTA bilaterally, no wheezes, crackles, or other adventitious sounds CV: RRR, no m/g/c/r Abd: soft, NT, ND, +BS in all four quadrants; no HSM, guarding, ridigity, or rebound tenderness Ext: no edema, well perfused with 2+ pulses, Skin: no rash or lesions noted Lymph: no LAD  Data: Lab Results  Component Value Date   WBC 8.8 04/05/2023   HGB 14.5 04/05/2023   HCT 45.3 04/05/2023   MCV 84.9 04/05/2023   PLT 317 04/05/2023   Recent Labs  Lab 04/05/23 0752 04/05/23 1208  HGB 14.9 14.5   Lab Results  Component Value Date   NA 137 04/05/2023   K 4.0 04/05/2023   CL 103 04/05/2023   CO2 26 04/05/2023   BUN 14 04/05/2023   CREATININE 0.79 04/05/2023   Lab Results  Component Value Date   ALT 18 04/12/2022   AST 16 04/12/2022   ALKPHOS 78 04/12/2022   BILITOT 1.4 (H) 04/12/2022   Recent Labs  Lab 04/05/23 0752  APTT 31  INR 1.0    CTA abd/pelvis w/wo contrast 04/05/2023: IMPRESSION: VASCULAR   No aneurysm, dissection or occlusion.   NON-VASCULAR   1. No acute abdominal or pelvic pathology. 2. No evidence of active hemorrhage during contrast administration. 3. There is a left kidney cyst. 4. There are atheromatous changes.  Assessment/Plan:  78 y/o male with a PMH of HTN, HLD, COPD, T2DM, GERD, aortic atherosclerosis, chronic insomnia, obesity, paroxsymal atrial fibrillation s/p ablation, BPH, CHF, and OSA on CPAP presents to [facility] ED from home for chief complaint of multiple episodes of painless hematochezia  Lower GI bleeding/Painless hematochezia - H&H stable currently with no overt gastrointestinal bleeding. DDx includes diverticular bleed most likely given history and clinical presentation and also consider colon polyps, neoplasm, AVMs, colitis, anal outlet etiology, etc.   History of diverticular bleed   History of partial colectomy 2018 2/2 recurrent diverticular bleeding  COPD  OSA on CPAP  Recommendations:  - Continue to monitor serial H&H. Transfuse for Hgb <7.0.  - Maintain 2 large bore IVs for access - Continue supportive care  - Avoid NSAIDs - Continue to monitor for signs of GI bleeding - Plan for colonoscopy tomorrow morning with Dr. Norma Fredricksonoledo for luminal evaluation - If he has active GI bleeding, recommend NM GI Bleeding Scan - Further recommendations after colonoscopy  I reviewed the risks (including bleeding, perforation, infection, anesthesia complications, cardiac/respiratory complications), benefits and alternatives of colonoscopy. Patient consents to proceed.    Thank you for the consult. Please call with questions or concerns.  Gilda CreaseGranville M Syd Newsome, PA-C [facility] [phone]

## 2023-04-05 NOTE — Assessment & Plan Note (Signed)
2D echo February 2019 with EF of 50-55% Euvolemic on exam Monitor volume status

## 2023-04-05 NOTE — Assessment & Plan Note (Signed)
Stable from a respiratory standpoint Continue home inhalers

## 2023-04-05 NOTE — Assessment & Plan Note (Signed)
BP stable Titrate home regimen 

## 2023-04-05 NOTE — ED Triage Notes (Addendum)
Pt here with rectal bleeding that started this morning. Pt states he has a hx of diverticulosis. Pt states the blood was light and had clots in it. Pt denies abd pain. Pt denies NVD. Pt states he had surgery in 2018 where he had a piece of his colon removed.

## 2023-04-05 NOTE — ED Notes (Signed)
Pt had a BM and this RN visualized it and it was bright red blood with some formed stools, MD aware.

## 2023-04-06 ENCOUNTER — Observation Stay: Payer: Medicare HMO | Admitting: Anesthesiology

## 2023-04-06 ENCOUNTER — Encounter
Admission: EM | Disposition: A | Discharge status: Home / Self Care | Payer: Self-pay | Source: Home / Self Care | Attending: Internal Medicine

## 2023-04-06 ENCOUNTER — Encounter: Payer: Self-pay | Admitting: Family Medicine

## 2023-04-06 DIAGNOSIS — K922 Gastrointestinal hemorrhage, unspecified: Secondary | ICD-10-CM | POA: Diagnosis present

## 2023-04-06 DIAGNOSIS — Z86718 Personal history of other venous thrombosis and embolism: Secondary | ICD-10-CM | POA: Diagnosis not present

## 2023-04-06 DIAGNOSIS — J449 Chronic obstructive pulmonary disease, unspecified: Secondary | ICD-10-CM | POA: Diagnosis present

## 2023-04-06 DIAGNOSIS — Z8041 Family history of malignant neoplasm of ovary: Secondary | ICD-10-CM | POA: Diagnosis not present

## 2023-04-06 DIAGNOSIS — Z87891 Personal history of nicotine dependence: Secondary | ICD-10-CM | POA: Diagnosis not present

## 2023-04-06 DIAGNOSIS — E669 Obesity, unspecified: Secondary | ICD-10-CM | POA: Diagnosis present

## 2023-04-06 DIAGNOSIS — H9190 Unspecified hearing loss, unspecified ear: Secondary | ICD-10-CM | POA: Diagnosis present

## 2023-04-06 DIAGNOSIS — N400 Enlarged prostate without lower urinary tract symptoms: Secondary | ICD-10-CM | POA: Diagnosis present

## 2023-04-06 DIAGNOSIS — E1121 Type 2 diabetes mellitus with diabetic nephropathy: Secondary | ICD-10-CM | POA: Diagnosis present

## 2023-04-06 DIAGNOSIS — I5032 Chronic diastolic (congestive) heart failure: Secondary | ICD-10-CM | POA: Diagnosis present

## 2023-04-06 DIAGNOSIS — G4733 Obstructive sleep apnea (adult) (pediatric): Secondary | ICD-10-CM | POA: Diagnosis present

## 2023-04-06 DIAGNOSIS — E7800 Pure hypercholesterolemia, unspecified: Secondary | ICD-10-CM | POA: Diagnosis present

## 2023-04-06 DIAGNOSIS — Z833 Family history of diabetes mellitus: Secondary | ICD-10-CM | POA: Diagnosis not present

## 2023-04-06 DIAGNOSIS — I110 Hypertensive heart disease with heart failure: Secondary | ICD-10-CM | POA: Diagnosis present

## 2023-04-06 DIAGNOSIS — Z96651 Presence of right artificial knee joint: Secondary | ICD-10-CM | POA: Diagnosis present

## 2023-04-06 DIAGNOSIS — Z7984 Long term (current) use of oral hypoglycemic drugs: Secondary | ICD-10-CM | POA: Diagnosis not present

## 2023-04-06 DIAGNOSIS — Z86010 Personal history of colonic polyps: Secondary | ICD-10-CM | POA: Diagnosis not present

## 2023-04-06 DIAGNOSIS — I480 Paroxysmal atrial fibrillation: Secondary | ICD-10-CM | POA: Diagnosis present

## 2023-04-06 DIAGNOSIS — M1712 Unilateral primary osteoarthritis, left knee: Secondary | ICD-10-CM | POA: Diagnosis present

## 2023-04-06 DIAGNOSIS — K640 First degree hemorrhoids: Secondary | ICD-10-CM | POA: Diagnosis present

## 2023-04-06 DIAGNOSIS — Z8249 Family history of ischemic heart disease and other diseases of the circulatory system: Secondary | ICD-10-CM | POA: Diagnosis not present

## 2023-04-06 DIAGNOSIS — D62 Acute posthemorrhagic anemia: Secondary | ICD-10-CM | POA: Diagnosis present

## 2023-04-06 DIAGNOSIS — I428 Other cardiomyopathies: Secondary | ICD-10-CM | POA: Diagnosis present

## 2023-04-06 DIAGNOSIS — D509 Iron deficiency anemia, unspecified: Secondary | ICD-10-CM | POA: Diagnosis present

## 2023-04-06 DIAGNOSIS — K5731 Diverticulosis of large intestine without perforation or abscess with bleeding: Secondary | ICD-10-CM | POA: Diagnosis present

## 2023-04-06 HISTORY — PX: COLONOSCOPY: SHX5424

## 2023-04-06 LAB — COMPREHENSIVE METABOLIC PANEL
ALT: 14 U/L (ref 0–44)
AST: 15 U/L (ref 15–41)
Albumin: 3.4 g/dL — ABNORMAL LOW (ref 3.5–5.0)
Alkaline Phosphatase: 81 U/L (ref 38–126)
Anion gap: 10 (ref 5–15)
BUN: 9 mg/dL (ref 8–23)
CO2: 27 mmol/L (ref 22–32)
Calcium: 8.5 mg/dL — ABNORMAL LOW (ref 8.9–10.3)
Chloride: 102 mmol/L (ref 98–111)
Creatinine, Ser: 0.81 mg/dL (ref 0.61–1.24)
GFR, Estimated: >60 mL/min (ref >60)
Glucose, Bld: 147 mg/dL — ABNORMAL HIGH (ref 70–99)
Potassium: 3.7 mmol/L (ref 3.5–5.1)
Sodium: 139 mmol/L (ref 135–145)
Total Bilirubin: 1.2 mg/dL (ref 0.3–1.2)
Total Protein: 6.3 g/dL — ABNORMAL LOW (ref 6.5–8.1)

## 2023-04-06 LAB — HEMOGLOBIN AND HEMATOCRIT, BLOOD
HCT: 41.5 % (ref 39.0–52.0)
HCT: 41.4 % (ref 39.0–52.0)
Hemoglobin: 13.2 g/dL (ref 13.0–17.0)
Hemoglobin: 13.2 g/dL (ref 13.0–17.0)

## 2023-04-06 LAB — GLUCOSE, CAPILLARY
Glucose-Capillary: 101 mg/dL — ABNORMAL HIGH (ref 70–99)
Glucose-Capillary: 113 mg/dL — ABNORMAL HIGH (ref 70–99)
Glucose-Capillary: 133 mg/dL — ABNORMAL HIGH (ref 70–99)
Glucose-Capillary: 155 mg/dL — ABNORMAL HIGH (ref 70–99)
Glucose-Capillary: 156 mg/dL — ABNORMAL HIGH (ref 70–99)
Glucose-Capillary: 157 mg/dL — ABNORMAL HIGH (ref 70–99)
Glucose-Capillary: 92 mg/dL (ref 70–99)

## 2023-04-06 SURGERY — COLONOSCOPY
Anesthesia: General

## 2023-04-06 MED ORDER — LACTATED RINGERS IV SOLN: INTRAVENOUS | Status: DC

## 2023-04-06 MED ORDER — ALBUTEROL SULFATE (2.5 MG/3ML) 0.083% IN NEBU: 2.5000 mg | INHALATION_SOLUTION | RESPIRATORY_TRACT | Status: DC | PRN

## 2023-04-06 MED ORDER — ADULT MULTIVITAMIN W/MINERALS CH
1.0000 | ORAL_TABLET | Freq: Every morning | ORAL | Status: DC
  Administered 2023-04-07 – 2023-04-08 (×2): 1 via ORAL
  Filled 2023-04-06 (×2): qty 1

## 2023-04-06 MED ORDER — PROPOFOL 500 MG/50ML IV EMUL
INTRAVENOUS | Status: DC | PRN
  Administered 2023-04-06: 150 ug/kg/min via INTRAVENOUS

## 2023-04-06 MED ORDER — VITAMIN B-12 1000 MCG PO TABS
1000.0000 ug | ORAL_TABLET | Freq: Every day | ORAL | Status: DC
  Administered 2023-04-06 – 2023-04-08 (×3): 1000 ug via ORAL
  Filled 2023-04-06 (×3): qty 1

## 2023-04-06 MED ORDER — PRAVASTATIN SODIUM 20 MG PO TABS
40.0000 mg | ORAL_TABLET | Freq: Every day | ORAL | Status: DC
  Administered 2023-04-06 – 2023-04-07 (×2): 40 mg via ORAL
  Filled 2023-04-06 (×2): qty 2

## 2023-04-06 MED ORDER — HYDROXYZINE HCL 25 MG PO TABS
25.0000 mg | ORAL_TABLET | Freq: Every evening | ORAL | Status: DC | PRN
  Administered 2023-04-06 – 2023-04-07 (×2): 25 mg via ORAL
  Filled 2023-04-06 (×2): qty 1

## 2023-04-06 MED ORDER — SODIUM CHLORIDE 0.9 % IV SOLN: INTRAVENOUS | Status: DC | PRN

## 2023-04-06 MED ORDER — VITAMIN D 25 MCG (1000 UNIT) PO TABS
2000.0000 [IU] | ORAL_TABLET | Freq: Every day | ORAL | Status: DC
  Administered 2023-04-06 – 2023-04-08 (×3): 2000 [IU] via ORAL
  Filled 2023-04-06 (×3): qty 2

## 2023-04-06 MED ORDER — PROPOFOL 10 MG/ML IV BOLUS
INTRAVENOUS | Status: DC | PRN
  Administered 2023-04-06: 80 mg via INTRAVENOUS

## 2023-04-06 MED ORDER — PHENYLEPHRINE HCL (PRESSORS) 10 MG/ML IV SOLN
INTRAVENOUS | Status: DC | PRN
  Administered 2023-04-06 (×2): 160 ug via INTRAVENOUS

## 2023-04-06 MED ORDER — LIDOCAINE HCL (CARDIAC) PF 100 MG/5ML IV SOSY
PREFILLED_SYRINGE | INTRAVENOUS | Status: DC | PRN
  Administered 2023-04-06: 40 mg via INTRAVENOUS

## 2023-04-06 NOTE — Hospital Course (Signed)
HPI on admission 04/05/23 by Dr. Alvester Morin: "Evan [name] is a 78 y.o. male with medical history significant of diverticular bleeding status post sigmoidectomy 2018, COPD, CHF, type 2 diabetes, hypertension presenting with GI bleeding.  Patient reports having some generalized lower abdominal discomfort earlier this morning then patient had a very large bloody bowel movement.  Patient denies any nausea prior to episode.  Has remote history of significant GI bleeding several years ago that required colectomy for treatment.  Takes baby aspirin daily.  Denies any NSAID use.  Denies any strenuous activity.  No reports of change in diet.  Marland KitchenMarland KitchenMarland KitchenPatient subsequently came to the ER for further evaluation given the bloody bowel movements. Presented to the ER afebrile, hemodynamically stable.  White count 9, hemoglobin 15, creatinine 0.8.  CT angio GI bleed stable for any acute bleeding."  Pt was admitted to the hospital with GI consulted for furhter evaluation.  4/6 -- colonoscopy showed non-bleeding internal hemorrhoids, blood in the entire examined colon, moderate diverticulosis of entire examined colon, healthy appearing anastomosis. No source of bleeding was visualized. Resumed full liquid diet post-procedure. Hbg stable at 13.2.

## 2023-04-06 NOTE — Transfer of Care (Signed)
Immediate Anesthesia Transfer of Care Note  Patient: Evan [name]  Procedure(s) Performed: Procedure(s): COLONOSCOPY (N/A)  Patient Location: PACU   Anesthesia Type:General  Level of Consciousness: sedated  Airway & Oxygen Therapy: Patient Spontanous Breathing and Patient connected to nasal cannula oxygen  Post-op Assessment: Report given to RN and Post -op Vital signs reviewed and stable  Post vital signs: Reviewed and stable  Last Vitals:  Vitals:   04/06/23 0902 04/06/23 0942  BP: 137/60 96/66  Pulse: 76   Resp: 16 14  Temp: (!) 35.8 C   SpO2: 98% 96%    Complications: No apparent anesthesia complications

## 2023-04-06 NOTE — Anesthesia Preprocedure Evaluation (Signed)
Anesthesia Evaluation  Patient identified by MRN, date of birth, ID band Patient awake    Reviewed: Allergy & Precautions, NPO status , Patient's Chart, lab work & pertinent test results  History of Anesthesia Complications Negative for: history of anesthetic complications  Airway Mallampati: III  TM Distance: >3 FB Neck ROM: full    Dental  (+) Chipped, Poor Dentition, Missing   Pulmonary shortness of breath and with exertion, sleep apnea , COPD, former smoker   Pulmonary exam normal        Cardiovascular Exercise Tolerance: Good hypertension, (-) angina +CHF  Normal cardiovascular exam     Neuro/Psych negative neurological ROS  negative psych ROS   GI/Hepatic negative GI ROS, Neg liver ROS,,,  Endo/Other  diabetes, Type 2    Renal/GU Renal disease  negative genitourinary   Musculoskeletal   Abdominal   Peds  Hematology negative hematology ROS (+)   Anesthesia Other Findings Past Medical History: No date: Adenomatous polyp of colon No date: Arthritis     Comment:  xDJD No date: Atrial fibrillation No date: BPH (benign prostatic hyperplasia) No date: CHF (congestive heart failure) No date: COPD (chronic obstructive pulmonary disease) No date: Diabetes mellitus without complication     Comment:  type 2 No date: Discitis No date: Diverticulitis No date: Diverticulosis No date: DVT (deep venous thrombosis)     Comment:  bilateral tibial No date: Erectile dysfunction No date: Hearing loss No date: Hyperlipidemia No date: Hypertension No date: Ischemic optic neuropathy No date: Lower GI bleeding No date: Lumbar degenerative disc disease No date: Non-ischemic cardiomyopathy No date: Sleep apnea  Past Surgical History: 2010: ATRIAL ABLATION SURGERY 10/10/2020: CATARACT EXTRACTION W/PHACO; Right     Comment:  Procedure: CATARACT EXTRACTION PHACO AND INTRAOCULAR               LENS PLACEMENT (IOC) RIGHT  ISTENT INJ DIABETIC 2.64                00:29.4;  Surgeon: Nevada Crane, MD;  Location:               [facility];  Service: Ophthalmology;                Laterality: Right;  Diabetic - oral meds 03/06/2021: CATARACT EXTRACTION W/PHACO; Left     Comment:  Procedure: CATARACT EXTRACTION PHACO AND INTRAOCULAR               LENS PLACEMENT (IOC) LEFT DIABETIC ISTENT INJ 3.62               00:30.6;  Surgeon: Nevada Crane, MD;  Location:               [facility];  Service: Ophthalmology;                Laterality: Left;  Diabetic - oral meds 2011: CHOLECYSTECTOMY 02/24/2018: COLONOSCOPY; Left     Comment:  Procedure: COLONOSCOPY;  Surgeon: Pasty Spillers,               MD;  Location: [facility] ENDOSCOPY;  Service: Endoscopy;                Laterality: Left; No date: COLONOSCOPY     Comment:  2008, 2010, 2013 09/25/2017: COLONOSCOPY WITH PROPOFOL; N/A     Comment:  Procedure: COLONOSCOPY WITH PROPOFOL;  Surgeon: Scot Jun, MD;  Location:  [facility] ENDOSCOPY;  Service:               Endoscopy;  Laterality: N/A; 02/18/2018: COLONOSCOPY WITH PROPOFOL; N/A     Comment:  Procedure: COLONOSCOPY WITH PROPOFOL;  Surgeon: Midge MiniumWohl,               Darren, MD;  Location: [facility] ENDOSCOPY;  Service:               Endoscopy;  Laterality: N/A; 2011: ERCP 02/23/2018: ESOPHAGOGASTRODUODENOSCOPY; Left     Comment:  Procedure: ESOPHAGOGASTRODUODENOSCOPY (EGD);  Surgeon:               Pasty Spillersahiliani, Varnita B, MD;  [facility];                Service: Endoscopy;  Laterality: Left; 2014: ESOPHAGOGASTRODUODENOSCOPY 04/17/2022: IVC FILTER INSERTION; N/A     Comment:  Procedure: IVC FILTER INSERTION;  Surgeon: Renford DillsSchnier,               Gregory G, MD;  Location: [facility] INVASIVE CV LAB;  Service:              Cardiovascular;  Laterality: N/A; 07/31/2022: IVC FILTER REMOVAL; N/A     Comment:  Procedure: IVC FILTER REMOVAL;  Surgeon: Renford DillsSchnier,               Gregory G, MD;   Location: [facility] INVASIVE CV LAB;  Service:              Cardiovascular;  Laterality: N/A; 01/01/2017: LAPAROTOMY     Comment:  Procedure: EXPLORATORY LAPAROTOMY;  Surgeon: Henrene DodgeJose               Piscoya, MD;  Location: [facility] ORS;  Service: General;; 01/01/2017: PARTIAL COLECTOMY; N/A     Comment:  Procedure: PARTIAL COLECTOMY;  Surgeon: Henrene DodgeJose Piscoya,               MD;  Location: [facility] ORS;  Service: General;  Laterality:               N/A; 12/28/2016: PERIPHERAL VASCULAR CATHETERIZATION; N/A     Comment:  Procedure: Visceral Artery Intervention;  Surgeon: Annice NeedyJason              S Dew, MD;  Location: [facility] INVASIVE CV LAB;  Service:               Cardiovascular;  Laterality: N/A; 01/08/2017: SECONDARY CLOSURE OF WOUND; N/A     Comment:  Procedure: SECONDARY CLOSURE FOR EVISCERATION;  Surgeon:              Lattie Hawichard E Cooper, MD;  Location: [facility] ORS;  Service:               General;  Laterality: N/A; 2018: SIGMOIDECTOMY 04/24/2022: TOTAL HIP ARTHROPLASTY; Right     Comment:  Procedure: TOTAL HIP ARTHROPLASTY ANTERIOR APPROACH;                Surgeon: Kennedy BuckerMenz, Michael, MD;  Location: [facility] ORS;                Service: Orthopedics;  Laterality: Right; 03/24/2014: TOTAL KNEE ARTHROPLASTY; Right     Comment:  times 4  BMI    Body Mass Index: 35.68 kg/m      Reproductive/Obstetrics negative OB ROS                             Anesthesia Physical Anesthesia Plan  ASA: 3  Anesthesia Plan: General   Post-op Pain Management:    Induction: Intravenous  PONV Risk Score and Plan: Propofol infusion and TIVA  Airway Management Planned: Natural Airway and Nasal Cannula  Additional Equipment:   Intra-op Plan:   Post-operative Plan:   Informed Consent: I have reviewed the patients History and Physical, chart, labs and discussed the procedure including the risks, benefits and alternatives for the proposed anesthesia with the patient or authorized representative who has indicated  his/her understanding and acceptance.     Dental Advisory Given  Plan Discussed with: Anesthesiologist, CRNA and Surgeon  Anesthesia Plan Comments: (Patient consented for risks of anesthesia including but not limited to:  - adverse reactions to medications - risk of airway placement if required - damage to eyes, teeth, lips or other oral mucosa - nerve damage due to positioning  - sore throat or hoarseness - Damage to heart, brain, nerves, lungs, other parts of body or loss of life  Patient voiced understanding.)       Anesthesia Quick Evaluation

## 2023-04-06 NOTE — Interval H&P Note (Signed)
History and Physical Interval Note:  04/06/2023 9:13 AM  Evan [name]  has presented today for surgery, with the diagnosis of Hematochezia, hx of diverticular bleed.  The various methods of treatment have been discussed with the patient and family. After consideration of risks, benefits and other options for treatment, the patient has consented to  Procedure(s): COLONOSCOPY (N/A) as a surgical intervention.  The patient's history has been reviewed, patient examined, no change in status, stable for surgery.  I have reviewed the patient's chart and labs.  Questions were answered to the patient's satisfaction.     [place], [place]

## 2023-04-06 NOTE — Progress Notes (Signed)
Progress Note   Patient: Evan [name] QIW:[id number] DOB: 1945/04/13 DOA: 04/05/2023     0 DOS: the patient was seen and examined on 04/06/2023   Brief hospital course: HPI on admission 04/05/23 by Dr. Alvester Morin: "Evan [name] is a 78 y.o. male with medical history significant of diverticular bleeding status post sigmoidectomy 2018, COPD, CHF, type 2 diabetes, hypertension presenting with GI bleeding.  Patient reports having some generalized lower abdominal discomfort earlier this morning then patient had a very large bloody bowel movement.  Patient denies any nausea prior to episode.  Has remote history of significant GI bleeding several years ago that required colectomy for treatment.  Takes baby aspirin daily.  Denies any NSAID use.  Denies any strenuous activity.  No reports of change in diet.  Marland KitchenMarland KitchenMarland KitchenPatient subsequently came to the ER for further evaluation given the bloody bowel movements. Presented to the ER afebrile, hemodynamically stable.  White count 9, hemoglobin 15, creatinine 0.8.  CT angio GI bleed stable for any acute bleeding."  Pt was admitted to the hospital with GI consulted for furhter evaluation.  4/6 -- colonoscopy showed non-bleeding internal hemorrhoids, blood in the entire examined colon, moderate diverticulosis of entire examined colon, healthy appearing anastomosis. No source of bleeding was visualized. Resumed full liquid diet post-procedure. Hbg stable at 13.2.  Assessment and Plan: * Lower GI bleeding History of Diverticular Bleeding s/p sigmoidectomy 2018 Bloody bowel movements x 2-3 day of presentation.   Hemoglobin stable. 4/6 colonoscopy performed, see above and Op Note --Hold aspirin, avoid NSAIDs --Management per GI - see their recs --Trend Hbg, transfuse for < 7 -- Full liquid diet, advance per GI --Repeat stat CTA if active bleeding recurs  Chronic diastolic CHF (congestive heart failure) 2D echo February 2019 with EF of 55-60% Euvolemic on  exam Monitor volume status  Stage 2 moderate COPD by GOLD classification Stable.  Continue home inhalers.  Hypertension BP stable, soft earlier today. Home amlodipine on hold. Resume when needed.  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  He reports feeling okay.  No bleeding with bowel prep or today so far.  They are both worried about recurrent bleeding.  He denies any abdominal pain.  Physical Exam: Vitals:   04/06/23 0944 04/06/23 0950 04/06/23 1103 04/06/23 1516  BP: 96/66 112/76 (!) 145/74 128/83  Pulse: 81  70 99  Resp:   18 18  Temp: (!) 97.1 F (36.2 C)  98.4 F (36.9 C) 98.2 F (36.8 C)  TempSrc:      SpO2:   100% 95%  Weight:      Height:       General exam: awake, alert, no acute distress HEENT: moist mucus membranes, hearing grossly normal  Respiratory system: CTAB, no wheezes, rales or rhonchi, normal respiratory effort. Cardiovascular system: normal S1/S2, RRR, no JVD, murmurs, rubs, gallops, no pedal edema.   Gastrointestinal system: soft, NT, ND, no HSM felt, +bowel sounds. Central nervous system: A&O x4. no gross focal neurologic deficits, normal speech Extremities: moves all, no edema, normal tone Skin: dry, intact, normal temperature Psychiatry: normal mood, congruent affect, judgement and insight appear normal    Data Reviewed:  Notable labs ---  Hbg trend -- 14.5 >> 13.5 >> 12.9 >> 13.2 >> 13.2 CMP normal except glucose 147, Ca 8.5, albumin 3.4, tprotein 6.3   Family Communication: wife at bedside  Disposition: Status is: Inpatient Remains inpatient appropriate because: ongoing evaluation and close monitoring of GI bleeding while diet is advanced.  Expect dc in 24-48 hours if no further bleeding.     Planned Discharge Destination: Home    Time spent: 42 minutes  Author: Pennie Banter, DO 04/06/2023  4:27 PM  For on call review [email or web address].

## 2023-04-06 NOTE — Assessment & Plan Note (Signed)
History of Diverticular Bleeding s/p sigmoidectomy 2018 Bloody bowel movements x 2-3 day of presentation.   Hemoglobin stable. 4/6 colonoscopy performed, see above and Op Note --Management per GI - see their recs --Trend Hbg, transfuse for < 7 -- Advance diet - tolerating --No further bleeding, had BM this AM Stable for discharge.

## 2023-04-06 NOTE — Assessment & Plan Note (Signed)
Stable.  Continue home inhalers ?

## 2023-04-06 NOTE — Assessment & Plan Note (Signed)
2D echo February 2019 with EF of 55-60% Euvolemic on exam Monitor volume status

## 2023-04-06 NOTE — Assessment & Plan Note (Signed)
BP stable, soft earlier today. Home amlodipine on hold. Resume when needed.

## 2023-04-06 NOTE — Op Note (Signed)
[facility] Patient Name: Evan [name] Procedure Date: 04/06/2023 8:43 AM MRN: [id number] Account #: [id number] Date of Birth: [birth year] Admit Type: Inpatient Age: 78 Room: [facility] ENDO ROOM 4 Gender: Male Note Status: Finalized Instrument Name: Prentice Docker [id number] Procedure:             Colonoscopy Indications:           Hematochezia, Acute post hemorrhagic anemia Providers:             Evan [name]. Evan Fredrickson MD, MD Referring MD:          Evan [name] (Referring MD) Medicines:             Propofol per Anesthesia Complications:         No immediate complications. Estimated blood loss: None. Procedure:             Pre-Anesthesia Assessment:                        - The risks and benefits of the procedure and the                         sedation options and risks were discussed with the                         patient. All questions were answered and informed                         consent was obtained.                        - Patient identification and proposed procedure were                         verified prior to the procedure by the nurse. The                         procedure was verified in the procedure room.                        - ASA Grade Assessment: III - A patient with severe                         systemic disease.                        - After reviewing the risks and benefits, the patient                         was deemed in satisfactory condition to undergo the                         procedure.                        After obtaining informed consent, the colonoscope was                         passed under direct vision. Throughout the procedure,  the patient's blood pressure, pulse, and oxygen                         saturations were monitored continuously. The                         Colonoscope was introduced through the anus and                         advanced to the the cecum, identified by  appendiceal                         orifice and ileocecal valve. The colonoscopy was                         somewhat difficult due to significant looping.                         Successful completion of the procedure was aided by                         applying abdominal pressure. The patient tolerated the                         procedure well. The quality of the bowel preparation                         was adequate. The ileocecal valve, appendiceal                         orifice, and rectum were photographed. Findings:      The perianal and digital rectal examinations were normal. Pertinent       negatives include normal sphincter tone and no palpable rectal lesions.      Non-bleeding internal hemorrhoids were found during retroflexion. The       hemorrhoids were Grade I (internal hemorrhoids that do not prolapse).      Hematin (altered blood/coffee-ground-like material) was found in the       entire colon.      There is no endoscopic evidence of bleeding, inflammation, mass, polyps       or ulcerations in the entire colon.      Many large-mouthed and medium-mouthed diverticula were found in the       entire colon. There was no evidence of diverticular bleeding.      There was evidence of a prior end-to-end colo-colonic anastomosis in the       sigmoid colon. This was patent and was characterized by healthy       appearing mucosa. The anastomosis was traversed. Estimated blood loss:       none.      The exam was otherwise without abnormality. Impression:            - Non-bleeding internal hemorrhoids.                        - Blood in the entire examined colon.                        - Moderate diverticulosis in the entire examined  colon. There was no evidence of diverticular bleeding.                        - Patent end-to-end colo-colonic anastomosis,                         characterized by healthy appearing mucosa.                        - The  examination was otherwise normal.                        - No specimens collected.                        - Pancolonic diverticulosis with bleeding source                         likely in the left colon Recommendation:        - Return patient to hospital ward for observation.                        - Full liquid diet.                        - Serial H/H, Serial exams                        - GI service will continue to follow the patient's                         progress and closely observe for any changes in                         clinical status. Procedure Code(s):     --- Professional ---                        [phone], Colonoscopy, flexible; diagnostic, including                         collection of specimen(s) by brushing or washing, when                         performed (separate procedure) Diagnosis Code(s):     --- Professional ---                        K57.30, Diverticulosis of large intestine without                         perforation or abscess without bleeding                        D62, Acute posthemorrhagic anemia                        K92.1, Melena (includes Hematochezia)                        Z98.0, Intestinal bypass and anastomosis status  K64.0, First degree hemorrhoids                        K92.2, Gastrointestinal hemorrhage, unspecified CPT copyright 2022 American Medical Association. All rights reserved. The codes documented in this report are preliminary and upon coder review may  be revised to meet current compliance requirements. Evan Kidneyeodoro K Donalyn Schneeberger MD, MD 04/06/2023 9:47:57 AM This report has been signed electronically. Number of Addenda: 0 Note Initiated On: 04/06/2023 8:43 AM Scope Withdrawal Time: 0 hours 9 minutes 53 seconds  Total Procedure Duration: 0 hours 18 minutes 1 second  Estimated Blood Loss:  Estimated blood loss: none.      [facility]

## 2023-04-06 NOTE — Anesthesia Postprocedure Evaluation (Signed)
Anesthesia Post Note  Patient: Evan [name]  Procedure(s) Performed: COLONOSCOPY  Patient location during evaluation: PACU Anesthesia Type: General Level of consciousness: awake and alert Pain management: pain level controlled Vital Signs Assessment: post-procedure vital signs reviewed and stable Respiratory status: spontaneous breathing, nonlabored ventilation, respiratory function stable and patient connected to nasal cannula oxygen Cardiovascular status: blood pressure returned to baseline and stable Postop Assessment: no apparent nausea or vomiting Anesthetic complications: no   No notable events documented.   Last Vitals:  Vitals:   04/06/23 0950 04/06/23 1103  BP: 112/76 (!) 145/74  Pulse:  70  Resp:  18  Temp:  36.9 C  SpO2:  100%    Last Pain:  Vitals:   04/06/23 0950  TempSrc:   PainSc: 0-No pain                 [name]

## 2023-04-06 NOTE — Anesthesia Procedure Notes (Signed)
Date/Time: 04/06/2023 9:15 AM  Performed by: Stormy Fabian, CRNAPre-anesthesia Checklist: Patient identified, Emergency Drugs available, Suction available and Patient being monitored Patient Re-evaluated:Patient Re-evaluated prior to induction Oxygen Delivery Method: Nasal cannula Induction Type: IV induction Dental Injury: Teeth and Oropharynx as per pre-operative assessment  Comments: Nasal cannula with etCO2 monitoring

## 2023-04-06 NOTE — Assessment & Plan Note (Signed)
Sliding scale insulin 

## 2023-04-07 DIAGNOSIS — K922 Gastrointestinal hemorrhage, unspecified: Secondary | ICD-10-CM | POA: Diagnosis not present

## 2023-04-07 LAB — HEMOGLOBIN AND HEMATOCRIT, BLOOD
HCT: 39.1 % (ref 39.0–52.0)
Hemoglobin: 12.6 g/dL — ABNORMAL LOW (ref 13.0–17.0)

## 2023-04-07 LAB — BASIC METABOLIC PANEL
Anion gap: 6 (ref 5–15)
BUN: <5 mg/dL — ABNORMAL LOW (ref 8–23)
CO2: 28 mmol/L (ref 22–32)
Calcium: 8.6 mg/dL — ABNORMAL LOW (ref 8.9–10.3)
Chloride: 105 mmol/L (ref 98–111)
Creatinine, Ser: 0.74 mg/dL (ref 0.61–1.24)
GFR, Estimated: >60 mL/min (ref >60)
Glucose, Bld: 116 mg/dL — ABNORMAL HIGH (ref 70–99)
Potassium: 3.9 mmol/L (ref 3.5–5.1)
Sodium: 139 mmol/L (ref 135–145)

## 2023-04-07 LAB — CBC
HCT: 37.5 % — ABNORMAL LOW (ref 39.0–52.0)
Hemoglobin: 11.9 g/dL — ABNORMAL LOW (ref 13.0–17.0)
MCH: 27 pg (ref 26.0–34.0)
MCHC: 31.7 g/dL (ref 30.0–36.0)
MCV: 85.2 fL (ref 80.0–100.0)
Platelets: 267 10*3/uL (ref 150–400)
RBC: 4.4 MIL/uL (ref 4.22–5.81)
RDW: 14.4 % (ref 11.5–15.5)
WBC: 7.8 10*3/uL (ref 4.0–10.5)
nRBC: 0 % (ref 0.0–0.2)

## 2023-04-07 LAB — IRON AND TIBC
Iron: 44 ug/dL — ABNORMAL LOW (ref 45–182)
Saturation Ratios: 12 % — ABNORMAL LOW (ref 17.9–39.5)
TIBC: 372 ug/dL (ref 250–450)
UIBC: 328 ug/dL

## 2023-04-07 LAB — GLUCOSE, CAPILLARY
Glucose-Capillary: 106 mg/dL — ABNORMAL HIGH (ref 70–99)
Glucose-Capillary: 145 mg/dL — ABNORMAL HIGH (ref 70–99)
Glucose-Capillary: 104 mg/dL — ABNORMAL HIGH (ref 70–99)
Glucose-Capillary: 110 mg/dL — ABNORMAL HIGH (ref 70–99)

## 2023-04-07 MED ORDER — INSULIN ASPART 100 UNIT/ML IJ SOLN
0.0000 [IU] | Freq: Three times a day (TID) | INTRAMUSCULAR | Status: DC
  Administered 2023-04-07 – 2023-04-08 (×2): 1 [IU] via SUBCUTANEOUS
  Filled 2023-04-07: qty 1

## 2023-04-07 NOTE — Progress Notes (Signed)
Progress Note   Patient: Evan [name] GSU:[id number] DOB: 01-14-1945 DOA: 04/05/2023     1 DOS: the patient was seen and examined on 04/07/2023   Brief hospital course: HPI on admission 04/05/23 by Dr. Alvester Morin: "Evan [name] is a 78 y.o. male with medical history significant of diverticular bleeding status post sigmoidectomy 2018, COPD, CHF, type 2 diabetes, hypertension presenting with GI bleeding.  Patient reports having some generalized lower abdominal discomfort earlier this morning then patient had a very large bloody bowel movement.  Patient denies any nausea prior to episode.  Has remote history of significant GI bleeding several years ago that required colectomy for treatment.  Takes baby aspirin daily.  Denies any NSAID use.  Denies any strenuous activity.  No reports of change in diet.  Marland KitchenMarland KitchenMarland KitchenPatient subsequently came to the ER for further evaluation given the bloody bowel movements. Presented to the ER afebrile, hemodynamically stable.  White count 9, hemoglobin 15, creatinine 0.8.  CT angio GI bleed stable for any acute bleeding."  Pt was admitted to the hospital with GI consulted for furhter evaluation.  4/6 -- colonoscopy showed non-bleeding internal hemorrhoids, blood in the entire examined colon, moderate diverticulosis of entire examined colon, healthy appearing anastomosis. No source of bleeding was visualized. Resumed full liquid diet post-procedure. Hbg stable at 13.2.  Assessment and Plan: * Lower GI bleeding History of Diverticular Bleeding s/p sigmoidectomy 2018 Bloody bowel movements x 2-3 day of presentation.   Hemoglobin stable. 4/6 colonoscopy performed, see above and Op Note --Hold aspirin, avoid NSAIDs --Management per GI - see their recs --Trend Hbg, transfuse for < 7 -- Advance diet --Repeat stat CTA if active bleeding recurs  Chronic diastolic CHF (congestive heart failure) 2D echo February 2019 with EF of 55-60% Euvolemic on exam Monitor volume  status  Stage 2 moderate COPD by GOLD classification Stable.  Continue home inhalers.  Hypertension BP stable, soft earlier today. Home amlodipine on hold. Resume when needed.  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  Reports feeling well.  No bleeding but has not had a BM since bowel prep for C-scope.  Agreeable to advancing diet today.  Tolerating full liquids well. No other acute complaints.    Physical Exam: Vitals:   04/06/23 1516 04/06/23 2029 04/07/23 0518 04/07/23 0809  BP: 128/83 124/65 114/71 130/79  Pulse: 99 73 72 71  Resp: 18 17 18 16   Temp: 98.2 F (36.8 C) 98 F (36.7 C) 98.3 F (36.8 C) 98.3 F (36.8 C)  TempSrc:      SpO2: 95% 95% 95% 97%  Weight:      Height:       General exam: awake, alert, no acute distress HEENT: moist mucus membranes, hearing grossly normal  Respiratory system: CTAB, no wheezes, rales or rhonchi, normal respiratory effort. Cardiovascular system: normal S1/S2, RRR, no pedal edema.   Gastrointestinal system: soft, NT, ND Central nervous system: A&O x4. no gross focal neurologic deficits, normal speech Extremities: moves all, no edema, normal tone Psychiatry: normal mood, congruent affect, judgement and insight appear normal    Data Reviewed:  Notable labs ---  Hbg trend -- 14.5 >> 13.5 >> 12.9 >> 13.2 >> 13.2 >> 11.9 BMP normal except glucose 116, BUN <5, Ca 8.6. Iron 44 (ref range 45--182), normal TIBC   Consults Gastroenterology  Procedures 04/06/23 -- Colonoscopy   Family Communication: wife  at bedside 4/6. Not present on AM rounds today, will attempt to call.  Disposition: Status is: Inpatient Remains inpatient appropriate because: ongoing evaluation and close monitoring of GI bleeding while diet is advanced.  Expect dc tomorrow if no further bleeding.     Planned Discharge Destination:  Home    Time spent: 35 minutes  Author: Pennie Banter, DO 04/07/2023 12:12 PM  For on call review [email or web address].

## 2023-04-07 NOTE — Progress Notes (Signed)
[facility] Inpatient Progress Note    Subjective: Patient seen for f/u diverticular bleed. Patient doing well with not abdominal pain. No bowel movements (including blood per rectum) since yesterday morning.Tolerating diet.  Objective: Vital signs in last 24 hours: Temp:  [98 F (36.7 C)-98.4 F (36.9 C)] 98.3 F (36.8 C) (04/07 0809) Pulse Rate:  [70-99] 71 (04/07 0809) Resp:  [16-18] 16 (04/07 0809) BP: (114-145)/(65-83) 130/79 (04/07 0809) SpO2:  [95 %-100 %] 97 % (04/07 0809) Blood pressure 130/79, pulse 71, temperature 98.3 F (36.8 C), resp. rate 16, height 6\' 3"  (1.905 m), weight 129.5 kg, SpO2 97 %.    Intake/Output from previous day: 04/06 0701 - 04/07 0700 In: 1557.8 [P.O.:480; I.V.:1077.8] Out: -   Intake/Output this shift: Total I/O In: 611.4 [I.V.:611.4] Out: -    Gen: NAD. Appears comfortable.  HEENT: [place]/AT. PERRLA. Normal external ear exam.  Chest: CTA, no wheezes.  CV: RR nl S1, S2. No gallops.  Abd: soft, nt, nd. BS+  Ext: no edema. Pulses 2+  Neuro: Alert and oriented. Judgement appears normal. Nonfocal.   Lab Results: Results for orders placed or performed during the hospital encounter of 04/05/23 (from the past 24 hour(s))  Hemoglobin and hematocrit, blood     Status: None   Collection Time: 04/06/23 11:44 AM  Result Value Ref Range   Hemoglobin 13.2 13.0 - 17.0 g/dL   HCT 82.7 07.8 - 67.5 %  Glucose, capillary     Status: Abnormal   Collection Time: 04/06/23 12:01 PM  Result Value Ref Range   Glucose-Capillary 101 (H) 70 - 99 mg/dL  Glucose, capillary     Status: Abnormal   Collection Time: 04/06/23  3:52 PM  Result Value Ref Range   Glucose-Capillary 155 (H) 70 - 99 mg/dL  Glucose, capillary     Status: Abnormal   Collection Time: 04/06/23  7:14 PM  Result Value Ref Range   Glucose-Capillary 157 (H) 70 - 99 mg/dL  Glucose, capillary     Status: None   Collection Time: 04/06/23 11:33 PM  Result Value Ref  Range   Glucose-Capillary 92 70 - 99 mg/dL  Iron and TIBC     Status: Abnormal   Collection Time: 04/07/23  4:49 AM  Result Value Ref Range   Iron 44 (L) 45 - 182 ug/dL   TIBC 449 201 - 007 ug/dL   Saturation Ratios 12 (L) 17.9 - 39.5 %   UIBC 328 ug/dL  Basic metabolic panel     Status: Abnormal   Collection Time: 04/07/23  4:51 AM  Result Value Ref Range   Sodium 139 135 - 145 mmol/L   Potassium 3.9 3.5 - 5.1 mmol/L   Chloride 105 98 - 111 mmol/L   CO2 28 22 - 32 mmol/L   Glucose, Bld 116 (H) 70 - 99 mg/dL   BUN <5 (L) 8 - 23 mg/dL   Creatinine, Ser 1.21 0.61 - 1.24 mg/dL   Calcium 8.6 (L) 8.9 - 10.3 mg/dL   GFR, Estimated >97 >58 mL/min   Anion gap 6 5 - 15  CBC     Status: Abnormal   Collection Time: 04/07/23  4:51 AM  Result Value Ref Range   WBC 7.8 4.0 - 10.5 K/uL   RBC 4.40 4.22 - 5.81 MIL/uL   Hemoglobin 11.9 (L) 13.0 - 17.0 g/dL   HCT 83.2 (L) 54.9 - 82.6 %   MCV 85.2 80.0 - 100.0 fL   MCH 27.0 26.0 -  34.0 pg   MCHC 31.7 30.0 - 36.0 g/dL   RDW 29.9 37.1 - 69.6 %   Platelets 267 150 - 400 K/uL   nRBC 0.0 0.0 - 0.2 %  Glucose, capillary     Status: Abnormal   Collection Time: 04/07/23  8:15 AM  Result Value Ref Range   Glucose-Capillary 106 (H) 70 - 99 mg/dL     Recent Labs    78/93/81 0752 04/05/23 1208 04/06/23 0518 04/06/23 1144 04/07/23 0451  WBC 8.8  --   --   --  7.8  HGB 14.9   < > 13.2 13.2 11.9*  HCT 46.5   < > 41.5 41.4 37.5*  PLT 317  --   --   --  267   < > = values in this interval not displayed.   BMET Recent Labs    04/05/23 0752 04/06/23 0518 04/07/23 0451  NA 137 139 139  K 4.0 3.7 3.9  CL 103 102 105  CO2 26 27 28   GLUCOSE 173* 147* 116*  BUN 14 9 <5*  CREATININE 0.79 0.81 0.74  CALCIUM 8.9 8.5* 8.6*   LFT Recent Labs    04/06/23 0518  PROT 6.3*  ALBUMIN 3.4*  AST 15  ALT 14  ALKPHOS 81  BILITOT 1.2   PT/INR Recent Labs    04/05/23 0752  LABPROT 13.5  INR 1.0   Hepatitis Panel No results for input(s):  "HEPBSAG", "HCVAB", "HEPAIGM", "HEPBIGM" in the last 72 hours. C-Diff No results for input(s): "CDIFFTOX" in the last 72 hours. No results for input(s): "CDIFFPCR" in the last 72 hours.   Studies/Results: No results found.  Scheduled Inpatient Medications:    cholecalciferol  2,000 Units Oral Daily   cyanocobalamin  1,000 mcg Oral Daily   insulin aspart  0-9 Units Subcutaneous TID WC   multivitamin with minerals  1 tablet Oral q morning   pravastatin  40 mg Oral QHS    Continuous Inpatient Infusions:    PRN Inpatient Medications:  albuterol, hydrOXYzine, ondansetron **OR** ondansetron (ZOFRAN) IV  Miscellaneous: N/A  Assessment:  S/p self-limited colonic diverticular bleed - Resolved, stable. COPD, currently stable without SOB or cough. Remote history of partial colectomy secondary to diverticular bleed.  Plan:  Advance diet. Observe and discharge home if tolerating solid food and having no further symptoms. Follow up with [facility] as needed. Thank you for allowing [place] to be involved in Mr. Warrenfeltz care.  Yoshio Seliga K. Norma Fredrickson, M.D. 04/07/2023, 10:54 AM

## 2023-04-08 ENCOUNTER — Encounter: Payer: Self-pay | Admitting: Internal Medicine

## 2023-04-08 DIAGNOSIS — K922 Gastrointestinal hemorrhage, unspecified: Secondary | ICD-10-CM | POA: Diagnosis not present

## 2023-04-08 LAB — CBC
HCT: 38.6 % — ABNORMAL LOW (ref 39.0–52.0)
Hemoglobin: 12.2 g/dL — ABNORMAL LOW (ref 13.0–17.0)
MCH: 26.8 pg (ref 26.0–34.0)
MCHC: 31.6 g/dL (ref 30.0–36.0)
MCV: 84.6 fL (ref 80.0–100.0)
Platelets: 292 10*3/uL (ref 150–400)
RBC: 4.56 MIL/uL (ref 4.22–5.81)
RDW: 14.4 % (ref 11.5–15.5)
WBC: 8 10*3/uL (ref 4.0–10.5)
nRBC: 0 % (ref 0.0–0.2)

## 2023-04-08 LAB — GLUCOSE, CAPILLARY
Glucose-Capillary: 121 mg/dL — ABNORMAL HIGH (ref 70–99)
Glucose-Capillary: 117 mg/dL — ABNORMAL HIGH (ref 70–99)

## 2023-04-08 MED ORDER — LACTULOSE 10 GM/15ML PO SOLN
20.0000 g | Freq: Once | ORAL | Status: AC
  Administered 2023-04-08: 20 g via ORAL
  Filled 2023-04-08: qty 30

## 2023-04-08 NOTE — Progress Notes (Signed)
AVS given and reviewed with pt and wife at bedside. Medications discussed. All questions answered to satisfaction. Pt verbalized understanding of information given. Pt to be escorted off the unit with all belongings via wheelchair by volunteer services.

## 2023-04-08 NOTE — Progress Notes (Signed)
Pt told this RN he had 2 small, formed bowel movements without blood. Esaw Grandchild, DO notified.

## 2023-04-08 NOTE — Discharge Summary (Signed)
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 Alvester Morin: "Evan [name] is a 78 y.o. male with medical history significant of diverticular bleeding status post sigmoidectomy 2018, COPD, CHF, type 2 diabetes, hypertension presenting with GI bleeding.  Patient reports having some generalized lower abdominal discomfort earlier this morning then patient had a very large bloody bowel movement.  Patient denies any nausea prior to episode.  Has remote history of significant GI bleeding several years ago that required colectomy for treatment.  Takes baby aspirin daily.  Denies any NSAID use.  Denies any strenuous activity.  No reports of change in diet.  Evan KitchenMarland KitchenMarland KitchenPatient subsequently came to the ER for further evaluation given the bloody bowel movements. Presented to the ER afebrile, hemodynamically stable.  White count 9, hemoglobin 15, creatinine 0.8.  CT angio GI bleed stable for any acute bleeding."  Pt was admitted to the hospital with GI consulted for furhter evaluation.  4/6 -- colonoscopy showed non-bleeding internal hemorrhoids, blood in the entire examined colon, moderate diverticulosis of entire examined colon, healthy appearing anastomosis. No source of bleeding was visualized. Resumed full liquid diet post-procedure. Hbg stable at 13.2.  Assessment  and Plan: * Lower GI bleeding-resolved as of 04/09/2023 History of Diverticular Bleeding s/p sigmoidectomy 2018 Bloody bowel movements x 2-3 day of presentation.   Hemoglobin stable. 4/6 colonoscopy performed, see above and Op Note --Management per GI - see their recs --Trend Hbg, transfuse for < 7 -- Advance diet - tolerating --No further bleeding, had BM this AM Stable for discharge.    Chronic diastolic CHF (congestive heart failure) 2D echo February 2019 with EF of 55-60% Euvolemic on exam Monitor volume status  Stage 2 moderate COPD by GOLD classification Stable.  Continue home inhalers.  Hypertension BP stable, soft earlier today. Home amlodipine on hold. Resume when needed.  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 Sliding-scale insulin         Consultants: GI Procedures performed: Colonoscopy  Disposition: Home Diet recommendation:  Cardiac diet DISCHARGE MEDICATION: Allergies as of 04/08/2023       Reactions   Azithromycin Rash   Penicillins Rash   Has patient had a PCN reaction causing immediate rash, facial/tongue/throat swelling, SOB or lightheadedness with hypotension: Yes Has patient had a PCN reaction causing severe rash involving mucus membranes or skin necrosis: Yes Has patient had a PCN reaction that required hospitalization No Has patient had a PCN reaction occurring within the last 10 years: No If all of the above answers are "NO", then may proceed with Cephalosporin use.   Lisinopril Cough   Losartan Cough   Metoprolol Other (See Comments)   Bradycardia.        Medication List     TAKE these medications    acetaminophen 500 MG tablet Commonly known as: TYLENOL Take 1,000 mg by mouth every  8 (eight) hours as needed.   albuterol 108 (90 Base) MCG/ACT inhaler Commonly known as: VENTOLIN HFA SMARTSIG:2 inhalation Via Inhaler Every 6 Hours PRN   amLODipine 10 MG tablet Commonly known as: NORVASC Take  10 mg by mouth daily.   aspirin EC 81 MG tablet Take 1 tablet (81 mg total) by mouth daily.   CENTRUM SILVER PO Take 1 tablet by mouth every morning.   cyanocobalamin 1000 MCG tablet Commonly known as: VITAMIN B12 Take 1,000 mcg by mouth daily.   hydrOXYzine 25 MG tablet Commonly known as: ATARAX Take 25 mg by mouth at bedtime as needed (sleep).   metFORMIN 500 MG 24 hr tablet Commonly known as: GLUCOPHAGE-XR 1,000 mg daily with breakfast.   polyethylene glycol 17 g packet Commonly known as: MIRALAX / GLYCOLAX Take 17 g by mouth daily. What changed:  when to take this reasons to take this   pravastatin 40 MG tablet Commonly known as: PRAVACHOL Take 40 mg by mouth at bedtime.   Vitamin D3 50 MCG (2000 UT) Caps Generic drug: Cholecalciferol Take 2,000 Units by mouth daily.        Discharge Exam: Filed Weights   04/05/23 0747  Weight: 129.5 kg   General exam: awake, alert, no acute distress HEENT: atraumatic, clear conjunctiva, anicteric sclera, moist mucus membranes, hearing grossly normal  Respiratory system: CTAB, no wheezes, rales or rhonchi, normal respiratory effort. Cardiovascular system: normal S1/S2, RRR, no JVD, murmurs, rubs, gallops, no pedal edema.   Gastrointestinal system: soft, NT, protuberant but non-distended, no HSM felt, +bowel sounds. Central nervous system: A&O x4. no gross focal neurologic deficits, normal speech Extremities: moves all , no edema, normal tone Skin: dry, intact, normal temperature Psychiatry: normal mood, congruent affect, judgement and insight appear normal   Condition at discharge: stable  The results of significant diagnostics from this hospitalization (including imaging, microbiology, ancillary and laboratory) are listed below for reference.   Imaging Studies: CT ANGIO GI BLEED  Result Date: 04/05/2023 CLINICAL DATA:  GI bleed EXAM: CTA ABDOMEN AND PELVIS WITHOUT AND WITH CONTRAST TECHNIQUE: Multidetector CT imaging  of the abdomen and pelvis was performed using the standard protocol during bolus administration of intravenous contrast. Multiplanar reconstructed images and MIPs were obtained and reviewed to evaluate the vascular anatomy. RADIATION DOSE REDUCTION: This exam was performed according to the departmental dose-optimization program which includes automated exposure control, adjustment of the mA and/or kV according to patient size and/or use of iterative reconstruction technique. CONTRAST:  OMNIPAQUE IOHEXOL 350 MG/ML SOLN COMPARISON:  None Available. FINDINGS: VASCULAR Aorta: Normal caliber aorta without aneurysm, dissection, vasculitis or significant stenosis. Celiac: Patent without evidence of aneurysm, dissection, vasculitis or significant stenosis. SMA: Patent without evidence of aneurysm, dissection, vasculitis or significant stenosis. Renals: Both renal arteries are patent without evidence of aneurysm, dissection, vasculitis, fibromuscular dysplasia or significant stenosis. IMA: Patent without evidence of aneurysm, dissection, vasculitis or significant stenosis. Inflow: Patent without evidence of aneurysm, dissection, vasculitis or significant stenosis. Proximal Outflow: Bilateral common femoral and visualized portions of the superficial and profunda femoral arteries are patent without evidence of aneurysm, dissection, vasculitis or significant stenosis. Veins: No obvious venous abnormality within the limitations of this arterial phase study. Review of the MIP images confirms the above findings. NON-VASCULAR Lower chest: Lung bases clear.  No pericardial or pleural effusion Hepatobiliary: No focal liver abnormality is seen. Status post cholecystectomy. No biliary dilatation. Pancreas: Unremarkable. No pancreatic ductal dilatation or surrounding inflammatory changes. Spleen: Normal in size without  focal abnormality. Adrenals/Urinary Tract: No adrenal lesions. 3 cm cyst left kidney upper pole that does not  need to be followed. No nephrolithiasis or hydronephrosis. Unremarkable urinary bladder. Stomach/Bowel: Stomach is within normal limits. Appendix appears normal. No evidence of bowel wall thickening, distention, or inflammatory changes. Lymphatic: Aortic atherosclerosis. No enlarged abdominal or pelvic lymph nodes. Reproductive: Prostate is unremarkable. Other: No abdominal wall hernia or abnormality. No abdominopelvic ascites. Musculoskeletal: Right hip prosthesis. Thoracolumbosacral degenerative changes. IMPRESSION: VASCULAR No aneurysm, dissection or occlusion. NON-VASCULAR 1. No acute abdominal or pelvic pathology. 2. No evidence of active hemorrhage during contrast administration. 3. There is a left kidney cyst. 4. There are atheromatous changes. Electronically Signed   By: Layla Maw M.D.   On: 04/05/2023 09:26   MR PROSTATE W WO CONTRAST  Result Date: 03/18/2023 CLINICAL DATA:  Elevated PSA.  RIGHT hip arthroplasty. EXAM: MR PROSTATE WITHOUT AND WITH CONTRAST TECHNIQUE: Multiplanar multisequence MRI images were obtained of the pelvis centered about the prostate. Pre and post contrast images were obtained. CONTRAST:  44mL GADAVIST GADOBUTROL 1 MMOL/ML IV SOLN COMPARISON:  None available. FINDINGS: Prostate: Exam compromised by artifact due to a RIGHT hip arthroplasty which highly limits diffusion weighted imaging. Transitional zone: 14 mm area in the LEFT prostate base transitional zone (image 11/6) this shows mild heterogeneity on T2 but is mainly hypointense, baseline category PIRADS category 3. Linear and wedge-shaped areas of T2 hypointensity throughout the peripheral zone indicative of sequela of prior prostatitis. Peripheral zone assessment limited by susceptibility from RIGHT hip arthroplasty. Near the apex is of 14 mm area restricted diffusion and corresponding low T2 signal (image 17/8) PIRADS category 4 this is in the RIGHT posterolateral apical peripheral zone. Elsewhere in the RIGHT  peripheral zone diffusion-weighted imaging is highly limited. Volume: 53 cc Transcapsular spread:  Absent Seminal vesicle involvement: Absent Neurovascular bundle involvement: Absent Pelvic adenopathy: Absent Bone metastasis: Absent Other findings: None IMPRESSION: 1. PIRADS category 4 lesion in the RIGHT posterolateral apical peripheral zone. 2. PIRADS category 3 lesion in the LEFT prostate base transitional zone. Electronically Signed   By: Donzetta Kohut M.D.   On: 03/18/2023 08:29    Microbiology: Results for orders placed or performed during the hospital encounter of 04/12/22  Surgical pcr screen     Status: None   Collection Time: 04/12/22  9:48 AM   Specimen: Nasal Mucosa; Nasal Swab  Result Value Ref Range Status   MRSA, PCR NEGATIVE NEGATIVE Final   Staphylococcus aureus NEGATIVE NEGATIVE Final    Comment: (NOTE) The Xpert SA Assay (FDA approved for NASAL specimens in patients 77 years of age and older), is one component of a comprehensive surveillance program. It is not intended to diagnose infection nor to guide or monitor treatment. Performed at [facility], [address] [place], [place] [address]     Labs: CBC: Recent Labs  Lab 04/05/23 [phone] 04/05/23 1208 04/06/23 0518 04/06/23 1144 04/07/23 0451 04/07/23 1615 04/08/23 0328  WBC 8.8  --   --   --  7.8  --  8.0  NEUTROABS 3.2  --   --   --   --   --   --   HGB 14.9   < > 13.2 13.2 11.9* 12.6* 12.2*  HCT 46.5   < > 41.5 41.4 37.5* 39.1 38.6*  MCV 84.9  --   --   --  85.2  --  84.6  PLT 317  --   --   --  267  --  292   < > = values in this interval not displayed.   Basic Metabolic Panel: Recent Labs  Lab 04/05/23 0752 04/06/23 0518 04/07/23 0451  NA 137 139 139  K 4.0 3.7 3.9  CL 103 102 105  CO2 26 27 28   GLUCOSE 173* 147* 116*  BUN 14 9 <5*  CREATININE 0.79 0.81 0.74  CALCIUM 8.9 8.5* 8.6*   Liver Function Tests: Recent Labs  Lab 04/06/23 0518  AST 15  ALT 14  ALKPHOS 81  BILITOT  1.2  PROT 6.3*  ALBUMIN 3.4*   CBG: Recent Labs  Lab 04/07/23 1132 04/07/23 1548 04/07/23 2257 04/08/23 0826 04/08/23 1127  GLUCAP 145* 104* 110* 121* 117*    Discharge time spent: less than 30 minutes.  Signed: Pennie BanterKelly A Rossana Molchan, DO [facility] 04/09/2023

## 2023-04-08 NOTE — Progress Notes (Signed)
GI Inpatient Follow-up Note  Subjective:  Patient seen in follow-up for self-limited diverticular bleed. No acute events overnight. He is tolerating regular diet without difficulties. No bowel movements since procedure. No overt hematochezia or melena. No new complaints. He feels ready to go home.   Scheduled Inpatient Medications:   cholecalciferol  2,000 Units Oral Daily   cyanocobalamin  1,000 mcg Oral Daily   insulin aspart  0-9 Units Subcutaneous TID WC   multivitamin with minerals  1 tablet Oral q morning   pravastatin  40 mg Oral QHS    PRN Inpatient Medications:  albuterol, hydrOXYzine, ondansetron **OR** ondansetron (ZOFRAN) IV  Review of Systems: Constitutional: Weight is stable.  Eyes: No changes in vision. ENT: No oral lesions, sore throat.  GI: see HPI.  Heme/Lymph: No easy bruising.  CV: No chest pain.  GU: No hematuria.  Integumentary: No rashes.  Neuro: No headaches.  Psych: No depression/anxiety.  Endocrine: No heat/cold intolerance.  Allergic/Immunologic: No urticaria.  Resp: No cough, SOB.  Musculoskeletal: No joint swelling.    Physical Examination: BP 133/81 (BP Location: Left Arm)   Pulse 70   Temp 98 F (36.7 C)   Resp 18   Ht 6\' 3"  (1.905 m)   Wt 129.5 kg   SpO2 98%   BMI 35.68 kg/m  Gen: NAD, alert and oriented x 4 HEENT: PEERLA, EOMI, Neck: supple, no JVD or thyromegaly Chest: CTA bilaterally, no wheezes, crackles, or other adventitious sounds CV: RRR, no m/g/c/r Abd: soft, NT, ND, +BS in all four quadrants; no HSM, guarding, ridigity, or rebound tenderness Ext: no edema, well perfused with 2+ pulses, Skin: no rash or lesions noted Lymph: no LAD  Data: Lab Results  Component Value Date   WBC 8.0 04/08/2023   HGB 12.2 (L) 04/08/2023   HCT 38.6 (L) 04/08/2023   MCV 84.6 04/08/2023   PLT 292 04/08/2023   Recent Labs  Lab 04/07/23 0451 04/07/23 1615 04/08/23 0328  HGB 11.9* 12.6* 12.2*   Lab Results  Component Value Date    NA 139 04/07/2023   K 3.9 04/07/2023   CL 105 04/07/2023   CO2 28 04/07/2023   BUN <5 (L) 04/07/2023   CREATININE 0.74 04/07/2023   Lab Results  Component Value Date   ALT 14 04/06/2023   AST 15 04/06/2023   ALKPHOS 81 04/06/2023   BILITOT 1.2 04/06/2023   Recent Labs  Lab 04/05/23 0752  APTT 31  INR 1.0    Assessment/Plan:  78 y/o male with a PMH of HTN, HLD, COPD, T2DM, GERD, aortic atherosclerosis, chronic insomnia, obesity, paroxsymal atrial fibrillation s/p ablation, BPH, CHF, and OSA on CPAP presents to the [facility] ED from home for chief complaint of multiple episodes of painless hematochezia   Lower GI bleeding/Painless hematochezia - s/p colonoscopy 4/6 with no evidence of active bleeding. There was hematin in entire examined colon with healthy appearing anastomosis. No source of bleeding visualized.    History of diverticular bleed    History of partial colectomy 2018 2/2 recurrent diverticular bleeding   COPD   OSA on CPAP  Recommendations:  - No evidence of recurrent GI bleeding - H&H stable currently - OK to d/c from GI standpoint this morning - Miralax 17 grams daily until bowels get back to baseline - He can follow-up as outpatient with primary GI ([facility]) or [facility] depending on his preference   Please call with questions or concerns.   Jacob Moores, PA-C [facility]  [phone]     

## 2023-04-08 NOTE — Plan of Care (Signed)

## 2023-04-09 ENCOUNTER — Telehealth: Payer: Self-pay | Admitting: Urology

## 2023-04-09 ENCOUNTER — Encounter: Payer: Self-pay | Admitting: Internal Medicine

## 2023-04-09 NOTE — Telephone Encounter (Signed)
FYI - Pt called and wanted to let Dr. Richardo Hanks know that he had a colonoscopy done over the weekend. TY

## 2023-04-17 ENCOUNTER — Encounter: Payer: Self-pay | Admitting: Urology

## 2023-04-17 ENCOUNTER — Ambulatory Visit: Payer: Medicare HMO | Admitting: Urology

## 2023-04-17 VITALS — BP 124/67 | HR 79 | Ht 75.0 in | Wt 283.0 lb

## 2023-04-17 DIAGNOSIS — C61 Malignant neoplasm of prostate: Secondary | ICD-10-CM

## 2023-04-17 DIAGNOSIS — Z2989 Encounter for other specified prophylactic measures: Secondary | ICD-10-CM | POA: Diagnosis not present

## 2023-04-17 DIAGNOSIS — R972 Elevated prostate specific antigen [PSA]: Secondary | ICD-10-CM | POA: Diagnosis not present

## 2023-04-17 MED ORDER — LEVOFLOXACIN 500 MG PO TABS
500.0000 mg | ORAL_TABLET | Freq: Once | ORAL | Status: AC
Start: 2023-04-17 — End: 2023-04-17
  Administered 2023-04-17: 500 mg via ORAL

## 2023-04-17 MED ORDER — GENTAMICIN SULFATE 40 MG/ML IJ SOLN
80.0000 mg | Freq: Once | INTRAMUSCULAR | Status: AC
Start: 2023-04-17 — End: 2023-04-17
  Administered 2023-04-17: 80 mg via INTRAMUSCULAR

## 2023-04-17 NOTE — Progress Notes (Signed)
   04/17/23  Indication: Elevated PSA, 6.87  MRI Fusion Prostate Biopsy Procedure   Informed consent was obtained, and we discussed the risks of bleeding and infection/sepsis. A time out was performed to ensure correct patient identity.  Pre-Procedure: - Last PSA Level: 6.87 - Gentamicin and levaquin given for antibiotic prophylaxis -Prostate measured 53 g on MRI, PSA density 0.13 - No significant hypoechoic or median lobe noted  Procedure: - Prostate block performed using 10 cc 1% lidocaine  - MRI fusion biopsy was performed, and 3 biopsies were taken from each ROI below ROI #1: PI-RADS 4 lesion right posterior lateral apical peripheral zone(3) ROI #2: PI-RADS 3 lesion left prostate base transitional(3)  - Standard biopsies taken from sextant areas, 12 under ultrasound guidance. - Total of 18 cores taken  Post-Procedure: - Patient tolerated the procedure well - He was counseled to seek immediate medical attention if experiences significant bleeding, fevers, or severe pain - Return in one week to discuss biopsy results  Assessment/ Plan: Will follow up in 1-2 weeks to discuss pathology with Dr. Mort Sawyers, MD 04/17/2023

## 2023-04-17 NOTE — Patient Instructions (Signed)

## 2023-04-22 LAB — SURGICAL PATHOLOGY

## 2023-05-01 ENCOUNTER — Ambulatory Visit: Payer: Medicare HMO | Admitting: Urology

## 2023-05-01 ENCOUNTER — Encounter: Payer: Self-pay | Admitting: Urology

## 2023-05-01 VITALS — BP 147/80 | HR 86 | Ht 76.0 in | Wt 283.0 lb

## 2023-05-01 DIAGNOSIS — C61 Malignant neoplasm of prostate: Secondary | ICD-10-CM | POA: Diagnosis not present

## 2023-05-01 NOTE — Progress Notes (Signed)
Evan [name],acting as a scribe for Evan Altes, MD., have documented all relevant documentation on the behalf of Evan Altes, MD, as directed by  Evan Altes, MD while in the presence of Evan Altes, MD.  05/01/2023 10:16 AM   [name] [birth year] [id number]  Referring provider: Mick Sell, MD [address] [place],  [place] [address]  Chief Complaint  Patient presents with   Results   Urologic History:  Elevated PSA   HPI: Evan [name] is a 78 y.o. male presents for prostate biopsy follow up.  Initially seen on 02/28/2023 for a PSA of 6.87. Prostate MRI was performed, which showed a 53 cc gland and a PI-RADS 4 lesion right posterolateral apical PZ, and a PI-RADS 3 lesion in the left base TZ. MR fusion biopsy performed by Dr. Richardo Hanks 04/17/2023. 3 biopsies of each ROI abnormalities were performed in addition to standard 12-core template biospy. He had no post-biopsy complaints. Pathology: ROI of the PI-RADS 4 lesion with one core showing Gleason 3+3 adenocarcinoma (2%). ROI 2 with 2 cores showing Gleason 3+3 adenocarcinoma (13%). 3/12 template biopsies were positive for cancer with Gleason 4+3 left lateral apex (29%), Gleason 3+4 left apex (50%), and Gleasin 3+3 at the right lateral mid (15%). MRI did not show any evidence of extracapsular extension , SV involvement, pelvic adenopathy, or pelvic bone metastatis.   PMH: Past Medical History:  Diagnosis Date   Adenomatous polyp of colon    Arthritis    xDJD   Atrial fibrillation (HCC)    BPH (benign prostatic hyperplasia)    CHF (congestive heart failure) (HCC)    COPD (chronic obstructive pulmonary disease) (HCC)    Diabetes mellitus without complication (HCC)    type 2   Discitis    Diverticulitis    Diverticulosis    DVT (deep venous thrombosis) (HCC)    bilateral tibial   Erectile dysfunction    Hearing loss    Hyperlipidemia    Hypertension    Ischemic optic  neuropathy    Lower GI bleeding    Lower GI bleeding 02/23/2018   Lumbar degenerative disc disease    Non-ischemic cardiomyopathy (HCC)    Sleep apnea     Surgical History: Past Surgical History:  Procedure Laterality Date   ATRIAL ABLATION SURGERY  2010   CATARACT EXTRACTION W/PHACO Right 10/10/2020   Procedure: CATARACT EXTRACTION PHACO AND INTRAOCULAR LENS PLACEMENT (IOC) RIGHT ISTENT INJ DIABETIC 2.64  00:29.4;  Surgeon: Nevada Crane, MD;  Location: [facility];  Service: Ophthalmology;  Laterality: Right;  Diabetic - oral meds   CATARACT EXTRACTION W/PHACO Left 03/06/2021   Procedure: CATARACT EXTRACTION PHACO AND INTRAOCULAR LENS PLACEMENT (IOC) LEFT DIABETIC ISTENT INJ 3.62 00:30.6;  Surgeon: Nevada Crane, MD;  Location: [facility];  Service: Ophthalmology;  Laterality: Left;  Diabetic - oral meds   CHOLECYSTECTOMY  2011   COLONOSCOPY Left 02/24/2018   Procedure: COLONOSCOPY;  Surgeon: Pasty Spillers, MD;  Location: [facility] ENDOSCOPY;  Service: Endoscopy;  Laterality: Left;   COLONOSCOPY     2008, 2010, 2013   COLONOSCOPY N/A 04/06/2023   Procedure: COLONOSCOPY;  Surgeon: Toledo, Boykin Nearing, MD;  Location: [facility] ENDOSCOPY;  Service: Gastroenterology;  Laterality: N/A;   COLONOSCOPY WITH PROPOFOL N/A 09/25/2017   Procedure: COLONOSCOPY WITH PROPOFOL;  Surgeon: Scot Jun, MD;  Location: [facility] ENDOSCOPY;  Service: Endoscopy;  Laterality: N/A;   COLONOSCOPY WITH PROPOFOL N/A 02/18/2018  Procedure: COLONOSCOPY WITH PROPOFOL;  Surgeon: Midge Minium, MD;  Location: [facility] [place] [facility] ENDOSCOPY;  Service: Endoscopy;  Laterality: N/A;   ERCP  2011   ESOPHAGOGASTRODUODENOSCOPY Left 02/23/2018   Procedure: ESOPHAGOGASTRODUODENOSCOPY (EGD);  Surgeon: Pasty Spillers, MD;  Location: [facility] ENDOSCOPY;  Service: Endoscopy;  Laterality: Left;   ESOPHAGOGASTRODUODENOSCOPY  2014   IVC FILTER INSERTION N/A 04/17/2022   Procedure: IVC FILTER INSERTION;  Surgeon: Renford Dills, MD;  Location: [facility] INVASIVE CV LAB;  Service: Cardiovascular;  Laterality: N/A;   IVC FILTER REMOVAL N/A 07/31/2022   Procedure: IVC FILTER REMOVAL;  Surgeon: Renford Dills, MD;  Location: [facility] INVASIVE CV LAB;  Service: Cardiovascular;  Laterality: N/A;   LAPAROTOMY  01/01/2017   Procedure: EXPLORATORY LAPAROTOMY;  Surgeon: Henrene Dodge, MD;  Location: [facility] ORS;  Service: General;;   PARTIAL COLECTOMY N/A 01/01/2017   Procedure: PARTIAL COLECTOMY;  Surgeon: Henrene Dodge, MD;  Location: [facility] ORS;  Service: General;  Laterality: N/A;   PERIPHERAL VASCULAR CATHETERIZATION N/A 12/28/2016   Procedure: Visceral Artery Intervention;  Surgeon: Annice Needy, MD;  Location: [facility] INVASIVE CV LAB;  Service: Cardiovascular;  Laterality: N/A;   SECONDARY CLOSURE OF WOUND N/A 01/08/2017   Procedure: SECONDARY CLOSURE FOR EVISCERATION;  Surgeon: Lattie Haw, MD;  Location: [facility] ORS;  Service: General;  Laterality: N/A;   SIGMOIDECTOMY  2018   TOTAL HIP ARTHROPLASTY Right 04/24/2022   Procedure: TOTAL HIP ARTHROPLASTY ANTERIOR APPROACH;  Surgeon: Kennedy Bucker, MD;  Location: [facility] ORS;  Service: Orthopedics;  Laterality: Right;   TOTAL KNEE ARTHROPLASTY Right 03/24/2014   times 4    Home Medications:  Allergies as of 05/01/2023       Reactions   Azithromycin Rash   Penicillins Rash   Has patient had a PCN reaction causing immediate rash, facial/tongue/throat swelling, SOB or lightheadedness with hypotension: Yes Has patient had a PCN reaction causing severe rash involving mucus membranes or skin necrosis: Yes Has patient had a PCN reaction that required hospitalization No Has patient had a PCN reaction occurring within the last 10 years: No If all of the above answers are "NO", then may proceed with Cephalosporin use.   Lisinopril Cough   Losartan Cough   Metoprolol Other (See Comments)   Bradycardia.        Medication List        Accurate as of May 01, 2023 10:16 AM. If you  have any questions, ask your nurse or doctor.          acetaminophen 500 MG tablet Commonly known as: TYLENOL Take 1,000 mg by mouth every 8 (eight) hours as needed.   albuterol 108 (90 Base) MCG/ACT inhaler Commonly known as: VENTOLIN HFA SMARTSIG:2 inhalation Via Inhaler Every 6 Hours PRN   amLODipine 10 MG tablet Commonly known as: NORVASC Take 10 mg by mouth daily.   aspirin EC 81 MG tablet Take 1 tablet (81 mg total) by mouth daily.   CENTRUM SILVER PO Take 1 tablet by mouth every morning.   cyanocobalamin 1000 MCG tablet Commonly known as: VITAMIN B12 Take 1,000 mcg by mouth daily.   hydrOXYzine 25 MG tablet Commonly known as: ATARAX Take 25 mg by mouth at bedtime as needed (sleep).   metFORMIN 500 MG 24 hr tablet Commonly known as: GLUCOPHAGE-XR 1,000 mg daily with breakfast.   polyethylene glycol 17 g packet Commonly known as: MIRALAX / GLYCOLAX Take 17 g by mouth daily. What changed:  when to take this reasons to take this  pravastatin 40 MG tablet Commonly known as: PRAVACHOL Take 40 mg by mouth at bedtime.   Vitamin D3 50 MCG (2000 UT) capsule Take 2,000 Units by mouth daily.        Allergies:  Allergies  Allergen Reactions   Azithromycin Rash   Penicillins Rash    Has patient had a PCN reaction causing immediate rash, facial/tongue/throat swelling, SOB or lightheadedness with hypotension: Yes Has patient had a PCN reaction causing severe rash involving mucus membranes or skin necrosis: Yes Has patient had a PCN reaction that required hospitalization No Has patient had a PCN reaction occurring within the last 10 years: No If all of the above answers are "NO", then may proceed with Cephalosporin use.      Lisinopril Cough   Losartan Cough   Metoprolol Other (See Comments)    Bradycardia.    Family History: Family History  Problem Relation Age of Onset   Diabetes Sister    Ovarian cancer Sister    Diabetes Sister    Diabetes  Brother    Heart attack Brother    Diabetes Brother     Social History:  reports that he quit smoking about 29 years ago. His smoking use included cigarettes. He has a 7.25 pack-year smoking history. He has been exposed to tobacco smoke. He has never used smokeless tobacco. He reports that he does not drink alcohol and does not use drugs.   Physical Exam: BP (!) 147/80   Pulse 86   Ht 6\' 4"  (1.93 m)   Wt 283 lb (128.4 kg)   BMI 34.45 kg/m   Constitutional:  Alert and oriented, No acute distress. HEENT:  AT Respiratory: Normal respiratory effort, no increased work of breathing. Psychiatric: Normal mood and affect.   Assessment & Plan:    1.  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 Pathology report was discussed in detail. His life expectancy based on SSA tables is 9.5 years. Management options in patients with a 5-10 year life expectancy based on SSA tables were reviewed. We discussed radiation modalities including IMRT and brachytherapy with and without ADT. Observation and hormonal therapy were also discussed. He is interested in a radiation oncology appointment for further discussion of treatment. Bone scan was ordered.   I have reviewed the above documentation for accuracy and completeness, and I agree with the above.   Evan Altes, MD  [facility] [address], [address] [place], [place] [address] [phone]

## 2023-05-08 ENCOUNTER — Encounter
Admission: RE | Admit: 2023-05-08 | Discharge: 2023-05-08 | Disposition: A | Discharge status: Home / Self Care | Payer: Medicare HMO | Source: Ambulatory Visit | Attending: Urology | Admitting: Urology

## 2023-05-08 DIAGNOSIS — C61 Malignant neoplasm of prostate: Secondary | ICD-10-CM | POA: Diagnosis not present

## 2023-05-08 MED ORDER — TECHNETIUM TC 99M MEDRONATE IV KIT
20.0000 | PACK | Freq: Once | INTRAVENOUS | Status: AC | PRN
  Administered 2023-05-08: 21.95 via INTRAVENOUS

## 2023-05-13 ENCOUNTER — Telehealth: Payer: Self-pay | Admitting: *Deleted

## 2023-05-13 ENCOUNTER — Ambulatory Visit
Admission: RE | Admit: 2023-05-13 | Discharge: 2023-05-13 | Disposition: A | Discharge status: Home / Self Care | Payer: Medicare HMO | Source: Ambulatory Visit | Attending: Radiation Oncology | Admitting: Radiation Oncology

## 2023-05-13 ENCOUNTER — Encounter: Payer: Self-pay | Admitting: Radiation Oncology

## 2023-05-13 VITALS — BP 141/78 | HR 88 | Temp 98.3°F | Resp 18 | Ht 75.75 in | Wt 283.5 lb

## 2023-05-13 DIAGNOSIS — C61 Malignant neoplasm of prostate: Secondary | ICD-10-CM

## 2023-05-13 NOTE — Telephone Encounter (Signed)
-----   Message from Riki Altes, MD sent at 05/10/2023  2:41 PM EDT ----- Please let patient know bone scan showed no evidence of metastatic disease.  He has a radiation oncology appointment next week

## 2023-05-13 NOTE — Consult Note (Signed)
NEW PATIENT EVALUATION  Name: Evan [name]  MRN: [id number]  Date:   05/13/2023     DOB: 1945-10-17   This 78 y.o. 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  REFERRING PHYSICIAN: Mick Sell, MD  CHIEF COMPLAINT:  Chief Complaint  Patient presents with   Prostate Cancer    consult    DIAGNOSIS: There were no encounter diagnoses.   PREVIOUS INVESTIGATIONS:  MRI scan reviewed, bone scan reviewed Pathology report reviewed Clinical notes reviewed  HPI: Patient is a 78 year old male who presented back in February with an elevated PSA of 6.87.  Prostate MRI showed a 53 cc gland and a PI-RADS 4 lesion in the right posterior lateral apical PZ and a PI-RADS 3 lesion at the left base.  Fusion biopsy was performed showing 3 out of 12 biopsies positive for adenocarcinoma highest Gleason score was 7 (4+3).  MRI showed no evidence of extracapsular extension or pelvic adenopathy.  He had a bone scan showing only some increased uptake in his right femur and pelvis adjacent to a right hip prosthesis that he had about a year ago most likely postoperative in nature.  He has very little side effect specifically denies any nocturia any change in his bowel pattern bone pain.  He does have some urinary urgency.  He is now referred to radiation collagen for consideration of treatment.  PLANNED TREATMENT REGIMEN: IMRT radiation therapy plus ADT therapy  PAST MEDICAL HISTORY:  has a past medical history of Adenomatous polyp of colon, Arthritis, Atrial fibrillation (HCC), BPH (benign prostatic hyperplasia), CHF (congestive heart failure) (HCC), COPD (chronic obstructive pulmonary disease) (HCC), Diabetes mellitus without complication (HCC), Discitis, Diverticulitis, Diverticulosis, DVT (deep venous thrombosis) (HCC), Erectile dysfunction, Hearing loss, Hyperlipidemia, Hypertension,  Ischemic optic neuropathy, Lower GI bleeding, Lower GI bleeding (02/23/2018), Lumbar degenerative disc disease, Non-ischemic cardiomyopathy (HCC), and Sleep apnea.    PAST SURGICAL HISTORY:  Past Surgical History:  Procedure Laterality Date   ATRIAL ABLATION SURGERY  2010   CATARACT EXTRACTION W/PHACO Right 10/10/2020   Procedure: CATARACT EXTRACTION PHACO AND INTRAOCULAR LENS PLACEMENT (IOC) RIGHT ISTENT INJ DIABETIC 2.64  00:29.4;  Surgeon: Nevada Crane, MD;  Location: [facility];  Service: Ophthalmology;  Laterality: Right;  Diabetic - oral meds   CATARACT EXTRACTION W/PHACO Left 03/06/2021   Procedure: CATARACT EXTRACTION PHACO AND INTRAOCULAR LENS PLACEMENT (IOC) LEFT DIABETIC ISTENT INJ 3.62 00:30.6;  Surgeon: Nevada Crane, MD;  Location: [facility] SUR[facility]ERY [facility];  Service: Ophthalmology;  Laterality: Left;  Diabetic - oral meds   CHOLECYSTECTOMY  2011   COLONOSCOPY Left 02/24/2018   Procedure: COLONOSCOPY;  Surgeon: Pasty Spillers, MD;  Location: [facility] ENDOSCOPY;  Service: Endoscopy;  Laterality: Left;   COLONOSCOPY     2008, 2010, 2013   COLONOSCOPY N/A 04/06/2023   Procedure: COLONOSCOPY;  Surgeon: Toledo, Boykin Nearing, MD;  Location: [facility] ENDOSCOPY;  Service: Gastroenterology;  Laterality: N/A;   COLONOSCOPY WITH PROPOFOL N/A 09/25/2017   Procedure: COLONOSCOPY WITH PROPOFOL;  Surgeon: Scot Jun, MD;  Location: [facility] ENDOSCOPY;  Service: Endoscopy;  Laterality: N/A;   COLONOSCOPY WITH PROPOFOL N/A 02/18/2018   Procedure: COLONOSCOPY WITH PROPOFOL;  Surgeon: Midge Minium, MD;  Location: [facility] ENDOSCOPY;  Service: Endoscopy;  Laterality: N/A;   ERCP  2011   ESOPHAGOGASTRODUODENOSCOPY Left 02/23/2018   Procedure: ESOPHAGOGASTRODUODENOSCOPY (EGD);  Surgeon: Pasty Spillers, MD;  Location: [facility]  ENDOSCOPY;  Service: Endoscopy;  Laterality: Left;   ESOPHAGOGASTRODUODENOSCOPY  2014   IVC FILTER INSERTION N/A 04/17/2022   Procedure: IVC FILTER INSERTION;  Surgeon:  Renford Dills, MD;  Location: [facility] INVASIVE CV LAB;  Service: Cardiovascular;  Laterality: N/A;   IVC FILTER REMOVAL N/A 07/31/2022   Procedure: IVC FILTER REMOVAL;  Surgeon: Renford Dills, MD;  Location: [facility] INVASIVE CV LAB;  Service: Cardiovascular;  Laterality: N/A;   LAPAROTOMY  01/01/2017   Procedure: EXPLORATORY LAPAROTOMY;  Surgeon: Henrene Dodge, MD;  Location: [facility] ORS;  Service: General;;   PARTIAL COLECTOMY N/A 01/01/2017   Procedure: PARTIAL COLECTOMY;  Surgeon: Henrene Dodge, MD;  Location: [facility] ORS;  Service: General;  Laterality: N/A;   PERIPHERAL VASCULAR CATHETERIZATION N/A 12/28/2016   Procedure: Visceral Artery Intervention;  Surgeon: Annice Needy, MD;  Location: [facility] INVASIVE CV LAB;  Service: Cardiovascular;  Laterality: N/A;   SECONDARY CLOSURE OF WOUND N/A 01/08/2017   Procedure: SECONDARY CLOSURE FOR EVISCERATION;  Surgeon: Lattie Haw, MD;  Location: [facility] ORS;  Service: General;  Laterality: N/A;   SIGMOIDECTOMY  2018   TOTAL HIP ARTHROPLASTY Right 04/24/2022   Procedure: TOTAL HIP ARTHROPLASTY ANTERIOR APPROACH;  Surgeon: Kennedy Bucker, MD;  Location: [facility] ORS;  Service: Orthopedics;  Laterality: Right;   TOTAL KNEE ARTHROPLASTY Right 03/24/2014   times 4    FAMILY HISTORY: family history includes Diabetes in his brother, brother, sister, and sister; Heart attack in his brother; Ovarian cancer in his sister.  SOCIAL HISTORY:  reports that he quit smoking about 29 years ago. His smoking use included cigarettes. He has a 7.25 pack-year smoking history. He has been exposed to tobacco smoke. He has never used smokeless tobacco. He reports that he does not drink alcohol and does not use drugs.  ALLERGIES: Azithromycin, Penicillins, Lisinopril, Losartan, and Metoprolol  MEDICATIONS:  Current Outpatient Medications  Medication Sig Dispense Refill   acetaminophen (TYLENOL) 500 MG tablet Take 1,000 mg by mouth every 8 (eight) hours as needed.     albuterol  (VENTOLIN HFA) 108 (90 Base) MCG/ACT inhaler SMARTSIG:2 inhalation Via Inhaler Every 6 Hours PRN     amLODipine (NORVASC) 10 MG tablet Take 10 mg by mouth daily.     aspirin EC 81 MG EC tablet Take 1 tablet (81 mg total) by mouth daily. 30 tablet 5   Cholecalciferol (VITAMIN D3) 2000 units capsule Take 2,000 Units by mouth daily.     hydrOXYzine (ATARAX/VISTARIL) 25 MG tablet Take 25 mg by mouth at bedtime as needed (sleep).     metFORMIN (GLUCOPHAGE-XR) 500 MG 24 hr tablet 1,000 mg daily with breakfast.      Multiple Vitamins-Minerals (CENTRUM SILVER PO) Take 1 tablet by mouth every morning.     polyethylene glycol (MIRALAX / GLYCOLAX) packet Take 17 g by mouth daily. (Patient taking differently: Take 17 g by mouth daily as needed for mild constipation.) 14 each 0   pravastatin (PRAVACHOL) 40 MG tablet Take 40 mg by mouth at bedtime.     vitamin B-12 (CYANOCOBALAMIN) 1000 MCG tablet Take 1,000 mcg by mouth daily.     No current facility-administered medications for this encounter.    ECOG PERFORMANCE STATUS:  0 - Asymptomatic  REVIEW OF SYSTEMS: Patient denies any weight loss, fatigue, weakness, fever, chills or night sweats. Patient denies any loss of vision, blurred vision. Patient denies any ringing  of the ears or hearing loss. No irregular heartbeat. Patient denies heart murmur or history of fainting. Patient  denies any chest pain or pain radiating to her upper extremities. Patient denies any shortness of breath, difficulty breathing at night, cough or hemoptysis. Patient denies any swelling in the lower legs. Patient denies any nausea vomiting, vomiting of blood, or coffee ground material in the vomitus. Patient denies any stomach pain. Patient states has had normal bowel movements no significant constipation or diarrhea. Patient denies any dysuria, hematuria or significant nocturia. Patient denies any problems walking, swelling in the joints or loss of balance. Patient denies any skin  changes, loss of hair or loss of weight. Patient denies any excessive worrying or anxiety or significant depression. Patient denies any problems with insomnia. Patient denies excessive thirst, polyuria, polydipsia. Patient denies any swollen glands, patient denies easy bruising or easy bleeding. Patient denies any recent infections, allergies or URI. Patient "s visual fields have not changed significantly in recent time.   PHYSICAL EXAM: BP (!) 141/78   Pulse 88   Temp 98.3 F (36.8 C)   Resp 18   Ht 6' 3.75" (1.924 m)   Wt 283 lb 8 oz (128.6 kg)   BMI 34.74 kg/m  Well-developed well-nourished patient in NAD. HEENT reveals PERLA, EOMI, discs not visualized.  Oral cavity is clear. No oral mucosal lesions are identified. Neck is clear without evidence of cervical or supraclavicular adenopathy. Lungs are clear to A&P. Cardiac examination is essentially unremarkable with regular rate and rhythm without murmur rub or thrill. Abdomen is benign with no organomegaly or masses noted. Motor sensory and DTR levels are equal and symmetric in the upper and lower extremities. Cranial nerves II through XII are grossly intact. Proprioception is intact. No peripheral adenopathy or edema is identified. No motor or sensory levels are noted. Crude visual fields are within normal range.  LABORATORY DATA: Pathology reports reviewed    RADIOLOGY RESULTS: Bone scan and MRI scan reviewed compatible with above-stated findings   IMPRESSION: Stage IIc Gleason 7 (4+3) adenocarcinoma the prostate in 78 year old male  PLAN: Based on the [facility] nomogram he has approximate 11% chance of lymph node involvement.  I do not think the bone scan findings of his right hip are significant based on his prior surgery and his PSA level at this time.  I have offered image guided IMRT radiation therapy to his prostate.  Would plan on delivering 80 Gray over 8 weeks.  Risks and benefits of treatment including increased  lower urinary tract symptoms diarrhea fatigue alteration blood counts skin reaction all were described in detail to the patient.  I have asked Dr. Lonna Cobb to place fiducial markers in his prostate for daily image guided treatment.  I also like him to have a 49-month Eligard injection coinciding with his treatment.  Patient comprehends my recommendations well.  Simulation will be performed after markers are placed.  I would like to take this opportunity to thank you for allowing me to participate in the care of your patient.Carmina Miller, MD

## 2023-05-13 NOTE — Telephone Encounter (Signed)
Notified patient as instructed, patient pleased °

## 2023-05-19 ENCOUNTER — Emergency Department: Payer: Medicare HMO

## 2023-05-19 ENCOUNTER — Other Ambulatory Visit: Payer: Self-pay

## 2023-05-19 DIAGNOSIS — N401 Enlarged prostate with lower urinary tract symptoms: Secondary | ICD-10-CM | POA: Diagnosis present

## 2023-05-19 DIAGNOSIS — Z8719 Personal history of other diseases of the digestive system: Secondary | ICD-10-CM

## 2023-05-19 DIAGNOSIS — E785 Hyperlipidemia, unspecified: Secondary | ICD-10-CM | POA: Diagnosis present

## 2023-05-19 DIAGNOSIS — Z888 Allergy status to other drugs, medicaments and biological substances status: Secondary | ICD-10-CM

## 2023-05-19 DIAGNOSIS — L03115 Cellulitis of right lower limb: Secondary | ICD-10-CM | POA: Diagnosis present

## 2023-05-19 DIAGNOSIS — Z86718 Personal history of other venous thrombosis and embolism: Secondary | ICD-10-CM

## 2023-05-19 DIAGNOSIS — Z79899 Other long term (current) drug therapy: Secondary | ICD-10-CM

## 2023-05-19 DIAGNOSIS — Z86010 Personal history of colonic polyps: Secondary | ICD-10-CM

## 2023-05-19 DIAGNOSIS — Z7901 Long term (current) use of anticoagulants: Secondary | ICD-10-CM

## 2023-05-19 DIAGNOSIS — I2699 Other pulmonary embolism without acute cor pulmonale: Secondary | ICD-10-CM | POA: Diagnosis not present

## 2023-05-19 DIAGNOSIS — Z833 Family history of diabetes mellitus: Secondary | ICD-10-CM

## 2023-05-19 DIAGNOSIS — E7800 Pure hypercholesterolemia, unspecified: Secondary | ICD-10-CM | POA: Diagnosis present

## 2023-05-19 DIAGNOSIS — Z7982 Long term (current) use of aspirin: Secondary | ICD-10-CM

## 2023-05-19 DIAGNOSIS — Z8249 Family history of ischemic heart disease and other diseases of the circulatory system: Secondary | ICD-10-CM

## 2023-05-19 DIAGNOSIS — Z8041 Family history of malignant neoplasm of ovary: Secondary | ICD-10-CM

## 2023-05-19 DIAGNOSIS — G4733 Obstructive sleep apnea (adult) (pediatric): Secondary | ICD-10-CM | POA: Diagnosis present

## 2023-05-19 DIAGNOSIS — Z96641 Presence of right artificial hip joint: Secondary | ICD-10-CM | POA: Diagnosis present

## 2023-05-19 DIAGNOSIS — R200 Anesthesia of skin: Secondary | ICD-10-CM | POA: Diagnosis present

## 2023-05-19 DIAGNOSIS — I428 Other cardiomyopathies: Secondary | ICD-10-CM | POA: Diagnosis present

## 2023-05-19 DIAGNOSIS — Z880 Allergy status to penicillin: Secondary | ICD-10-CM

## 2023-05-19 DIAGNOSIS — Z7984 Long term (current) use of oral hypoglycemic drugs: Secondary | ICD-10-CM

## 2023-05-19 DIAGNOSIS — Z96651 Presence of right artificial knee joint: Secondary | ICD-10-CM | POA: Diagnosis present

## 2023-05-19 DIAGNOSIS — I110 Hypertensive heart disease with heart failure: Secondary | ICD-10-CM | POA: Diagnosis present

## 2023-05-19 DIAGNOSIS — E119 Type 2 diabetes mellitus without complications: Secondary | ICD-10-CM | POA: Diagnosis present

## 2023-05-19 DIAGNOSIS — R0602 Shortness of breath: Secondary | ICD-10-CM | POA: Diagnosis not present

## 2023-05-19 DIAGNOSIS — Z87891 Personal history of nicotine dependence: Secondary | ICD-10-CM

## 2023-05-19 DIAGNOSIS — Z881 Allergy status to other antibiotic agents status: Secondary | ICD-10-CM

## 2023-05-19 DIAGNOSIS — Z6834 Body mass index (BMI) 34.0-34.9, adult: Secondary | ICD-10-CM

## 2023-05-19 DIAGNOSIS — J449 Chronic obstructive pulmonary disease, unspecified: Secondary | ICD-10-CM | POA: Diagnosis present

## 2023-05-19 DIAGNOSIS — Z9049 Acquired absence of other specified parts of digestive tract: Secondary | ICD-10-CM

## 2023-05-19 DIAGNOSIS — I5032 Chronic diastolic (congestive) heart failure: Secondary | ICD-10-CM | POA: Diagnosis present

## 2023-05-19 LAB — BASIC METABOLIC PANEL
Anion gap: 11 (ref 5–15)
BUN: 15 mg/dL (ref 8–23)
CO2: 25 mmol/L (ref 22–32)
Calcium: 9 mg/dL (ref 8.9–10.3)
Chloride: 101 mmol/L (ref 98–111)
Creatinine, Ser: 1.09 mg/dL (ref 0.61–1.24)
GFR, Estimated: >60 mL/min (ref >60)
Glucose, Bld: 181 mg/dL — ABNORMAL HIGH (ref 70–99)
Potassium: 4.2 mmol/L (ref 3.5–5.1)
Sodium: 137 mmol/L (ref 135–145)

## 2023-05-19 LAB — CBC
HCT: 42.4 % (ref 39.0–52.0)
Hemoglobin: 13.1 g/dL (ref 13.0–17.0)
MCH: 25 pg — ABNORMAL LOW (ref 26.0–34.0)
MCHC: 30.9 g/dL (ref 30.0–36.0)
MCV: 80.9 fL (ref 80.0–100.0)
Platelets: 333 10*3/uL (ref 150–400)
RBC: 5.24 MIL/uL (ref 4.22–5.81)
RDW: 14.3 % (ref 11.5–15.5)
WBC: 14 10*3/uL — ABNORMAL HIGH (ref 4.0–10.5)
nRBC: 0 % (ref 0.0–0.2)

## 2023-05-19 LAB — TROPONIN I (HIGH SENSITIVITY): Troponin I (High Sensitivity): 20 ng/L — ABNORMAL HIGH (ref <18)

## 2023-05-19 LAB — D-DIMER, QUANTITATIVE: D-Dimer, Quant: 3.41 ug/mL-FEU — ABNORMAL HIGH (ref 0.00–0.50)

## 2023-05-19 NOTE — ED Triage Notes (Addendum)
Pt to ED via POV c/o SOB and right leg pain. Pt having right leg pain and numbness for about a year. Pt had hip replacement a year ago and has been having issues ever since. Thinks he has a blood clot, having numbness to upper leg. Pt having r ankle swelling. +pedal pulses, warm to touch. Pt complaining of SOB that started today. Denies CP, fevers, dizziness.

## 2023-05-20 ENCOUNTER — Other Ambulatory Visit (HOSPITAL_COMMUNITY): Payer: Self-pay

## 2023-05-20 ENCOUNTER — Encounter: Payer: Self-pay | Admitting: Internal Medicine

## 2023-05-20 ENCOUNTER — Inpatient Hospital Stay
Admission: EM | Admit: 2023-05-20 | Discharge: 2023-05-23 | DRG: 176 | Disposition: A | Discharge status: Home / Self Care | Payer: Medicare HMO | Attending: Obstetrics and Gynecology | Admitting: Obstetrics and Gynecology

## 2023-05-20 ENCOUNTER — Inpatient Hospital Stay (HOSPITAL_COMMUNITY)
Admit: 2023-05-20 | Discharge: 2023-05-20 | Disposition: A | Discharge status: Home / Self Care | Payer: Medicare HMO | Attending: Family Medicine | Admitting: Family Medicine

## 2023-05-20 ENCOUNTER — Encounter: Payer: Self-pay | Admitting: Oncology

## 2023-05-20 ENCOUNTER — Telehealth (HOSPITAL_COMMUNITY): Payer: Self-pay

## 2023-05-20 ENCOUNTER — Emergency Department: Payer: Medicare HMO

## 2023-05-20 DIAGNOSIS — Z86718 Personal history of other venous thrombosis and embolism: Secondary | ICD-10-CM | POA: Diagnosis not present

## 2023-05-20 DIAGNOSIS — I2699 Other pulmonary embolism without acute cor pulmonale: Secondary | ICD-10-CM | POA: Diagnosis present

## 2023-05-20 DIAGNOSIS — Z7984 Long term (current) use of oral hypoglycemic drugs: Secondary | ICD-10-CM | POA: Diagnosis not present

## 2023-05-20 DIAGNOSIS — Z8249 Family history of ischemic heart disease and other diseases of the circulatory system: Secondary | ICD-10-CM | POA: Diagnosis not present

## 2023-05-20 DIAGNOSIS — G4730 Sleep apnea, unspecified: Secondary | ICD-10-CM | POA: Diagnosis present

## 2023-05-20 DIAGNOSIS — Z96641 Presence of right artificial hip joint: Secondary | ICD-10-CM | POA: Diagnosis present

## 2023-05-20 DIAGNOSIS — E785 Hyperlipidemia, unspecified: Secondary | ICD-10-CM | POA: Insufficient documentation

## 2023-05-20 DIAGNOSIS — L03115 Cellulitis of right lower limb: Secondary | ICD-10-CM | POA: Diagnosis present

## 2023-05-20 DIAGNOSIS — G4733 Obstructive sleep apnea (adult) (pediatric): Secondary | ICD-10-CM | POA: Diagnosis present

## 2023-05-20 DIAGNOSIS — R200 Anesthesia of skin: Secondary | ICD-10-CM | POA: Diagnosis present

## 2023-05-20 DIAGNOSIS — R0602 Shortness of breath: Secondary | ICD-10-CM

## 2023-05-20 DIAGNOSIS — E119 Type 2 diabetes mellitus without complications: Secondary | ICD-10-CM

## 2023-05-20 DIAGNOSIS — Z7901 Long term (current) use of anticoagulants: Secondary | ICD-10-CM | POA: Diagnosis not present

## 2023-05-20 DIAGNOSIS — E7800 Pure hypercholesterolemia, unspecified: Secondary | ICD-10-CM | POA: Diagnosis present

## 2023-05-20 DIAGNOSIS — Z7982 Long term (current) use of aspirin: Secondary | ICD-10-CM | POA: Diagnosis not present

## 2023-05-20 DIAGNOSIS — Z8719 Personal history of other diseases of the digestive system: Secondary | ICD-10-CM

## 2023-05-20 DIAGNOSIS — Z6834 Body mass index (BMI) 34.0-34.9, adult: Secondary | ICD-10-CM | POA: Diagnosis not present

## 2023-05-20 DIAGNOSIS — N401 Enlarged prostate with lower urinary tract symptoms: Secondary | ICD-10-CM | POA: Diagnosis present

## 2023-05-20 DIAGNOSIS — Z79899 Other long term (current) drug therapy: Secondary | ICD-10-CM | POA: Diagnosis not present

## 2023-05-20 DIAGNOSIS — I110 Hypertensive heart disease with heart failure: Secondary | ICD-10-CM | POA: Diagnosis present

## 2023-05-20 DIAGNOSIS — Z96651 Presence of right artificial knee joint: Secondary | ICD-10-CM | POA: Diagnosis present

## 2023-05-20 DIAGNOSIS — I10 Essential (primary) hypertension: Secondary | ICD-10-CM | POA: Insufficient documentation

## 2023-05-20 DIAGNOSIS — J449 Chronic obstructive pulmonary disease, unspecified: Secondary | ICD-10-CM | POA: Diagnosis present

## 2023-05-20 DIAGNOSIS — I428 Other cardiomyopathies: Secondary | ICD-10-CM | POA: Diagnosis present

## 2023-05-20 DIAGNOSIS — I5032 Chronic diastolic (congestive) heart failure: Secondary | ICD-10-CM | POA: Diagnosis present

## 2023-05-20 DIAGNOSIS — Z9049 Acquired absence of other specified parts of digestive tract: Secondary | ICD-10-CM | POA: Diagnosis not present

## 2023-05-20 LAB — HEPARIN LEVEL (UNFRACTIONATED)
Heparin Unfractionated: 0.3 IU/mL (ref 0.30–0.70)
Heparin Unfractionated: 0.4 IU/mL (ref 0.30–0.70)

## 2023-05-20 LAB — ECHOCARDIOGRAM COMPLETE
AR max vel: 2.97 cm2
AV Area VTI: 2.78 cm2
AV Area mean vel: 2.5 cm2
AV Mean grad: 5.7 mmHg
AV Peak grad: 9.8 mmHg
Ao pk vel: 1.56 m/s
Area-P 1/2: 3.26 cm2
MV VTI: 4.13 cm2
S' Lateral: 4.1 cm

## 2023-05-20 LAB — TROPONIN I (HIGH SENSITIVITY)
Troponin I (High Sensitivity): 37 ng/L — ABNORMAL HIGH (ref <18)
Troponin I (High Sensitivity): 47 ng/L — ABNORMAL HIGH (ref <18)

## 2023-05-20 LAB — PROTIME-INR
INR: 1.2 (ref 0.8–1.2)
Prothrombin Time: 15.8 seconds — ABNORMAL HIGH (ref 11.4–15.2)

## 2023-05-20 LAB — BRAIN NATRIURETIC PEPTIDE: B Natriuretic Peptide: 140.5 pg/mL — ABNORMAL HIGH (ref 0.0–100.0)

## 2023-05-20 LAB — APTT: aPTT: 113 seconds — ABNORMAL HIGH (ref 24–36)

## 2023-05-20 LAB — LACTIC ACID, PLASMA
Lactic Acid, Venous: 1.7 mmol/L (ref 0.5–1.9)
Lactic Acid, Venous: 1.7 mmol/L (ref 0.5–1.9)

## 2023-05-20 MED ORDER — AMLODIPINE BESYLATE 10 MG PO TABS
10.0000 mg | ORAL_TABLET | Freq: Every day | ORAL | Status: DC
  Administered 2023-05-20 – 2023-05-23 (×4): 10 mg via ORAL
  Filled 2023-05-20: qty 1
  Filled 2023-05-20: qty 2
  Filled 2023-05-20 (×2): qty 1

## 2023-05-20 MED ORDER — ALBUTEROL SULFATE (2.5 MG/3ML) 0.083% IN NEBU: 2.5000 mg | INHALATION_SOLUTION | RESPIRATORY_TRACT | Status: DC | PRN

## 2023-05-20 MED ORDER — LEVOFLOXACIN IN D5W 750 MG/150ML IV SOLN
750.0000 mg | Freq: Once | INTRAVENOUS | Status: AC
  Administered 2023-05-20: 750 mg via INTRAVENOUS
  Filled 2023-05-20: qty 150

## 2023-05-20 MED ORDER — CLINDAMYCIN PHOSPHATE 600 MG/50ML IV SOLN
600.0000 mg | Freq: Three times a day (TID) | INTRAVENOUS | Status: DC
  Filled 2023-05-20: qty 50

## 2023-05-20 MED ORDER — ACETAMINOPHEN 650 MG RE SUPP: 650.0000 mg | Freq: Four times a day (QID) | RECTAL | Status: DC | PRN

## 2023-05-20 MED ORDER — ADULT MULTIVITAMIN W/MINERALS CH
1.0000 | ORAL_TABLET | Freq: Every morning | ORAL | Status: DC
  Administered 2023-05-20 – 2023-05-23 (×4): 1 via ORAL
  Filled 2023-05-20 (×4): qty 1

## 2023-05-20 MED ORDER — VITAMIN B-12 1000 MCG PO TABS
1000.0000 ug | ORAL_TABLET | Freq: Every day | ORAL | Status: DC
  Administered 2023-05-20 – 2023-05-23 (×4): 1000 ug via ORAL
  Filled 2023-05-20: qty 2
  Filled 2023-05-20 (×3): qty 1

## 2023-05-20 MED ORDER — HEPARIN SODIUM (PORCINE) 5000 UNIT/ML IJ SOLN: 4000.0000 [IU] | Freq: Once | INTRAMUSCULAR | Status: DC

## 2023-05-20 MED ORDER — ACETAMINOPHEN 325 MG PO TABS
650.0000 mg | ORAL_TABLET | Freq: Four times a day (QID) | ORAL | Status: DC | PRN
  Administered 2023-05-20: 650 mg via ORAL
  Filled 2023-05-20: qty 2

## 2023-05-20 MED ORDER — ONDANSETRON HCL 4 MG/2ML IJ SOLN: 4.0000 mg | Freq: Four times a day (QID) | INTRAMUSCULAR | Status: DC | PRN

## 2023-05-20 MED ORDER — ASPIRIN 81 MG PO TBEC
81.0000 mg | DELAYED_RELEASE_TABLET | Freq: Every day | ORAL | Status: DC
  Administered 2023-05-20 – 2023-05-21 (×2): 81 mg via ORAL
  Filled 2023-05-20 (×2): qty 1

## 2023-05-20 MED ORDER — HEPARIN BOLUS VIA INFUSION
6900.0000 [IU] | Freq: Once | INTRAVENOUS | Status: AC
  Administered 2023-05-20: 6900 [IU] via INTRAVENOUS
  Filled 2023-05-20: qty 6900

## 2023-05-20 MED ORDER — PRAVASTATIN SODIUM 20 MG PO TABS
40.0000 mg | ORAL_TABLET | Freq: Every day | ORAL | Status: DC
  Administered 2023-05-20 – 2023-05-22 (×3): 40 mg via ORAL
  Filled 2023-05-20 (×3): qty 2

## 2023-05-20 MED ORDER — HEPARIN (PORCINE) 25000 UT/250ML-% IV SOLN: 14.0000 [IU]/kg/h | INTRAVENOUS | Status: DC

## 2023-05-20 MED ORDER — VANCOMYCIN HCL 2000 MG/400ML IV SOLN
2000.0000 mg | Freq: Once | INTRAVENOUS | Status: AC
  Administered 2023-05-20: 2000 mg via INTRAVENOUS
  Filled 2023-05-20: qty 400

## 2023-05-20 MED ORDER — CLINDAMYCIN PHOSPHATE 600 MG/50ML IV SOLN
600.0000 mg | Freq: Three times a day (TID) | INTRAVENOUS | Status: DC
  Filled 2023-05-20 (×2): qty 50

## 2023-05-20 MED ORDER — POLYETHYLENE GLYCOL 3350 17 G PO PACK: 17.0000 g | PACK | Freq: Every day | ORAL | Status: DC | PRN

## 2023-05-20 MED ORDER — MAGNESIUM HYDROXIDE 400 MG/5ML PO SUSP: 30.0000 mL | Freq: Every day | ORAL | Status: DC | PRN

## 2023-05-20 MED ORDER — VITAMIN D 25 MCG (1000 UNIT) PO TABS
2000.0000 [IU] | ORAL_TABLET | Freq: Every day | ORAL | Status: DC
  Administered 2023-05-20 – 2023-05-23 (×4): 2000 [IU] via ORAL
  Filled 2023-05-20 (×4): qty 2

## 2023-05-20 MED ORDER — ONDANSETRON HCL 4 MG PO TABS: 4.0000 mg | ORAL_TABLET | Freq: Four times a day (QID) | ORAL | Status: DC | PRN

## 2023-05-20 MED ORDER — SODIUM CHLORIDE 0.9 % IV SOLN: INTRAVENOUS | Status: DC

## 2023-05-20 MED ORDER — TRAZODONE HCL 50 MG PO TABS: 25.0000 mg | ORAL_TABLET | Freq: Every evening | ORAL | Status: DC | PRN

## 2023-05-20 MED ORDER — IOHEXOL 350 MG/ML SOLN
100.0000 mL | Freq: Once | INTRAVENOUS | Status: AC | PRN
  Administered 2023-05-20: 100 mL via INTRAVENOUS

## 2023-05-20 MED ORDER — VANCOMYCIN VARIABLE DOSE PER UNSTABLE RENAL FUNCTION (PHARMACIST DOSING): Status: DC

## 2023-05-20 MED ORDER — HEPARIN (PORCINE) 25000 UT/250ML-% IV SOLN
1900.0000 [IU]/h | INTRAVENOUS | Status: DC
  Administered 2023-05-20 – 2023-05-21 (×3): 1900 [IU]/h via INTRAVENOUS
  Filled 2023-05-20 (×3): qty 250

## 2023-05-20 MED ORDER — ALBUTEROL SULFATE HFA 108 (90 BASE) MCG/ACT IN AERS: 2.0000 | INHALATION_SPRAY | RESPIRATORY_TRACT | Status: DC | PRN

## 2023-05-20 MED ORDER — HYDROXYZINE HCL 25 MG PO TABS: 25.0000 mg | ORAL_TABLET | Freq: Every evening | ORAL | Status: DC | PRN

## 2023-05-20 NOTE — Assessment & Plan Note (Signed)
-   The patient will be admitted to a progressive unit bed. - We will continue on IV heparin. - We will obtain 2D echo to assess for RV strain. - Will monitor O2 requirement.

## 2023-05-20 NOTE — Progress Notes (Signed)
Brief hospitalist update note.  This is a nonbillable note.  Please see same-day H&P for full billable details.  Briefly, this is a 78 year old male history significant for CHF, COPD, diabetes, OSA, BPH who presents to the ED with acute onset of worsening dyspnea x 1 week associated with right lower extremity pain.  Patient has scattered pulmonary emboli with no CT evidence of right heart strain.  Also has a clinical evidence of a right lower extremity cellulitis.  Interestingly right lower extremity ultrasound negative for VTE.  Started on intravenous antibiotics for cellulitis and heparin GTT for pulmonary embolism.  Pending 2D echocardiogram to rule out right heart strain.  If no right heart strain noted anticipate transition to oral anticoagulation.  Will maintain on broad-spectrum IV antibiotics for today.  Monitor vitals and fever curve.  Attempt to de-escalate antimicrobial therapy tomorrow.  Lolita Patella MD  No charge

## 2023-05-20 NOTE — H&P (Signed)
[place]   PATIENT NAME: Evan [name]    MR#:  [id number]  DATE OF BIRTH:  [birth year]  DATE OF ADMISSION:  05/20/2023  PRIMARY CARE PHYSICIAN: Mick Sell, MD   Patient is coming from: Home  REQUESTING/REFERRING PHYSICIAN: Chiquita Loth, MD  CHIEF COMPLAINT:   Chief Complaint  Patient presents with   Shortness of Breath    HISTORY OF PRESENT ILLNESS:  Evan [name] is a 78 y.o. male with medical history significant for BPH, CHF, COPD, type diabetes mellitus, OSA, DVT, hypertension and dyslipidemia, who presented to the ER with acute onset of worsening dyspnea for about a week and right lower extremity pain.  Reported chronic right leg pain with numbness for about a year.  He had a DVT in the same leg in the calf.  It feels more swollen, red and hot.  No fever or chills.  No chest pain or palpitations.  No nausea or vomiting or abdominal pain.  No melena or bright red bleeding per rectum.  He has been having mild urinary frequency with no dysuria, oliguria or hematuria or flank pain.  No other bleeding diathesis.  He admitted to light headedness without dizziness or blurred vision or paresthesias or focal muscle weakness.  ED course: When he came to the ER temperature was 99 heart rate was 110 respiratory 24 with otherwise normal vital signs.  Labs revealed a blood glucose of 181 with otherwise unremarkable BMP.  BN P was 140.5 and high sensitive troponin I was 20 and later 137.  Lactic acid was 1.7.  CBC showed leukocytosis of 14.  D-dimer was 3.41.  INR was 1.2 and PT 15.8 with PTT of 113 blood cultures were drawn. EKG as reviewed by me : EKG showed sinus tachycardia with rate of 120 with occasional PVCs and left axis deviation and left bundle branch block.. Imaging: Portable chest x-ray showed cardiomegaly with pulmonary vascular congestion and right basal atelectasis or infiltrates.  Chest CTA revealed the following: 1. Right-sided pulmonary emboli as described  above, but overall small clot burden without evidence of acute right heart strain. 2. Mild cardiomegaly with left chamber predominance. 3. Aortic and coronary artery atherosclerosis. 4. Diffuse bronchial thickening consistent with bronchitis or reactive airways disease. 5. Small hiatal hernia. 6. Hepatic steatosis. 7.  Aortic atherosclerosis.  The patient was given IV Levaquin and vancomycin as well as started on IV heparin with bolus and drip.  He will be admitted to a progressive unit bed for further evaluation and management. PAST MEDICAL HISTORY:   Past Medical History:  Diagnosis Date   Adenomatous polyp of colon    Arthritis    xDJD   Atrial fibrillation (HCC)    BPH (benign prostatic hyperplasia)    CHF (congestive heart failure) (HCC)    COPD (chronic obstructive pulmonary disease) (HCC)    Diabetes mellitus without complication (HCC)    type 2   Discitis    Diverticulitis    Diverticulosis    DVT (deep venous thrombosis) (HCC)    bilateral tibial   Erectile dysfunction    Hearing loss    Hyperlipidemia    Hypertension    Ischemic optic neuropathy    Lower GI bleeding    Lower GI bleeding 02/23/2018   Lumbar degenerative disc disease    Non-ischemic cardiomyopathy (HCC)    Sleep apnea     PAST SURGICAL HISTORY:   Past Surgical History:  Procedure Laterality Date   ATRIAL ABLATION  SURGERY  2010   CATARACT EXTRACTION W/PHACO Right 10/10/2020   Procedure: CATARACT EXTRACTION PHACO AND INTRAOCULAR LENS PLACEMENT (IOC) RIGHT ISTENT INJ DIABETIC 2.64  00:29.4;  Surgeon: Nevada Crane, MD;  Location: [facility];  Service: Ophthalmology;  Laterality: Right;  Diabetic - oral meds   CATARACT EXTRACTION W/PHACO Left 03/06/2021   Procedure: CATARACT EXTRACTION PHACO AND INTRAOCULAR LENS PLACEMENT (IOC) LEFT DIABETIC ISTENT INJ 3.62 00:30.6;  Surgeon: Nevada Crane, MD;  Location: [facility];  Service: Ophthalmology;  Laterality: Left;  Diabetic  - oral meds   CHOLECYSTECTOMY  2011   COLONOSCOPY Left 02/24/2018   Procedure: COLONOSCOPY;  Surgeon: Pasty Spillers, MD;  Location: [facility] ENDOSCOPY;  Service: Endoscopy;  Laterality: Left;   COLONOSCOPY     2008, 2010, 2013   COLONOSCOPY N/A 04/06/2023   Procedure: COLONOSCOPY;  Surgeon: Toledo, Boykin Nearing, MD;  Location: [facility] ENDOSCOPY;  Service: Gastroenterology;  Laterality: N/A;   COLONOSCOPY WITH PROPOFOL N/A 09/25/2017   Procedure: COLONOSCOPY WITH PROPOFOL;  Surgeon: Scot Jun, MD;  Location: [facility];  Service: Endoscopy;  Laterality: N/A;   COLONOSCOPY WITH PROPOFOL N/A 02/18/2018   Procedure: COLONOSCOPY WITH PROPOFOL;  Surgeon: Midge Minium, MD;  Location: [facility] ENDOSCOPY;  Service: Endoscopy;  Laterality: N/A;   ERCP  2011   ESOPHAGOGASTRODUODENOSCOPY Left 02/23/2018   Procedure: ESOPHAGOGASTRODUODENOSCOPY (EGD);  Surgeon: Pasty Spillers, MD;  Location: [facility] ENDOSCOPY;  Service: Endoscopy;  Laterality: Left;   ESOPHAGOGASTRODUODENOSCOPY  2014   IVC FILTER INSERTION N/A 04/17/2022   Procedure: IVC FILTER INSERTION;  Surgeon: Renford Dills, MD;  Location: [facility] INVASIVE CV LAB;  Service: Cardiovascular;  Laterality: N/A;   IVC FILTER REMOVAL N/A 07/31/2022   Procedure: IVC FILTER REMOVAL;  Surgeon: Renford Dills, MD;  Location: [facility] CV [facility];  Service: Cardiovascular;  Laterality: N/A;   LAPAROTOMY  01/01/2017   Procedure: EXPLORATORY LAPAROTOMY;  Surgeon: Henrene Dodge, MD;  Location: [facility] ORS;  Service: General;;   PARTIAL COLECTOMY N/A 01/01/2017   Procedure: PARTIAL COLECTOMY;  Surgeon: Henrene Dodge, MD;  Location: [facility] ORS;  Service: General;  Laterality: N/A;   PERIPHERAL VASCULAR CATHETERIZATION N/A 12/28/2016   Procedure: Visceral Artery Intervention;  Surgeon: Annice Needy, MD;  Location: [facility] INVASIVE CV LAB;  Service: Cardiovascular;  Laterality: N/A;   SECONDARY CLOSURE OF WOUND N/A 01/08/2017   Procedure: SECONDARY CLOSURE FOR  EVISCERATION;  Surgeon: Lattie Haw, MD;  Location: [facility] ORS;  Service: General;  Laterality: N/A;   SIGMOIDECTOMY  2018   TOTAL HIP ARTHROPLASTY Right 04/24/2022   Procedure: TOTAL HIP ARTHROPLASTY ANTERIOR APPROACH;  Surgeon: Kennedy Bucker, MD;  Location: [facility] ORS;  Service: Orthopedics;  Laterality: Right;   TOTAL KNEE ARTHROPLASTY Right 03/24/2014   times 4    SOCIAL HISTORY:   Social History   Tobacco Use   Smoking status: Former    Packs/day: 0.25    Years: 29.00    Additional pack years: 0.00    Total pack years: 7.25    Types: Cigarettes    Quit date: 12/31/1993    Years since quitting: 29.4    Passive exposure: Past   Smokeless tobacco: Never  Substance Use Topics   Alcohol use: No    Alcohol/week: 0.0 standard drinks of alcohol    FAMILY HISTORY:   Family History  Problem Relation Age of Onset   Diabetes Sister    Ovarian cancer Sister    Diabetes Sister    Diabetes Brother  Heart attack Brother    Diabetes Brother     DRUG ALLERGIES:   Allergies  Allergen Reactions   Azithromycin Rash   Penicillins Rash    Has patient had a PCN reaction causing immediate rash, facial/tongue/throat swelling, SOB or lightheadedness with hypotension: Yes Has patient had a PCN reaction causing severe rash involving mucus membranes or skin necrosis: Yes Has patient had a PCN reaction that required hospitalization No Has patient had a PCN reaction occurring within the last 10 years: No If all of the above answers are "NO", then may proceed with Cephalosporin use.      Lisinopril Cough   Losartan Cough   Metoprolol Other (See Comments)    Bradycardia.    REVIEW OF SYSTEMS:   ROS As per history of present illness. All pertinent systems were reviewed above. Constitutional, HEENT, cardiovascular, respiratory, GI, GU, musculoskeletal, neuro, psychiatric, endocrine, integumentary and hematologic systems were reviewed and are otherwise negative/unremarkable except for  positive findings mentioned above in the HPI.   MEDICATIONS AT HOME:   Prior to Admission medications   Medication Sig Start Date End Date Taking? Authorizing Provider  acetaminophen (TYLENOL) 500 MG tablet Take 1,000 mg by mouth every 8 (eight) hours as needed.   Yes [provider]  albuterol (VENTOLIN HFA) 108 (90 Base) MCG/ACT inhaler SMARTSIG:2 inhalation Via Inhaler Every 6 Hours PRN 07/25/22  Yes [provider]  amLODipine (NORVASC) 10 MG tablet Take 10 mg by mouth daily.   Yes [provider]  aspirin EC 81 MG EC tablet Take 1 tablet (81 mg total) by mouth daily. 01/16/17  Yes Loflin, Laney Potash, MD  Cholecalciferol (VITAMIN D3) 2000 units capsule Take 2,000 Units by mouth daily.   Yes [provider]  hydrOXYzine (ATARAX/VISTARIL) 25 MG tablet Take 25 mg by mouth at bedtime as needed (sleep).   Yes [provider]  metFORMIN (GLUCOPHAGE-XR) 500 MG 24 hr tablet 1,000 mg daily with breakfast.  08/30/17  Yes [provider]  Multiple Vitamins-Minerals (CENTRUM SILVER PO) Take 1 tablet by mouth every morning.   Yes [provider]  polyethylene glycol (MIRALAX / GLYCOLAX) packet Take 17 g by mouth daily. Patient taking differently: Take 17 g by mouth daily as needed for mild constipation. 02/25/18  Yes Gouru, Deanna Artis, MD  pravastatin (PRAVACHOL) 40 MG tablet Take 40 mg by mouth at bedtime. 05/03/16  Yes [provider]  vitamin B-12 (CYANOCOBALAMIN) 1000 MCG tablet Take 1,000 mcg by mouth daily.   Yes [provider]      VITAL SIGNS:  Blood pressure (!) 164/68, pulse (!) 103, temperature 99 F (37.2 C), temperature source Oral, resp. rate 20, height 6\' 4"  (1.93 m), weight 127.9 kg, SpO2 96 %.  PHYSICAL EXAMINATION:  Physical Exam  GENERAL:  78 y.o.-year-old male patient lying in the bed with no acute distress.  EYES: Pupils equal, round, reactive to light and accommodation. No scleral icterus. Extraocular  muscles intact.  HEENT: Head atraumatic, normocephalic. Oropharynx and nasopharynx clear.  NECK:  Supple, no jugular venous distention. No thyroid enlargement, no tenderness.  LUNGS: Normal breath sounds bilaterally, no wheezing, rales,rhonchi or crepitation. No use of accessory muscles of respiration.  CARDIOVASCULAR: Regular rate and rhythm, S1, S2 normal. No murmurs, rubs, or gallops.  ABDOMEN: Soft, nondistended, nontender. Bowel sounds present. No organomegaly or mass.  EXTREMITIES: 2+ right ankle pitting edema, with trace left leg edema with no cyanosis, or clubbing.  NEUROLOGIC: Cranial nerves II through XII are intact.  Muscle strength 5/5 in all extremities. Sensation intact. Gait not checked.  PSYCHIATRIC: The patient is alert and oriented x 3.  Normal affect and good eye contact. SKIN: Right lower leg swelling, erythema with warmth and tenderness at his calf.  2+ distal pulses.  LABORATORY PANEL:   CBC Recent Labs  Lab 05/19/23 2159  WBC 14.0*  HGB 13.1  HCT 42.4  PLT 333   ------------------------------------------------------------------------------------------------------------------  Chemistries  Recent Labs  Lab 05/19/23 2159  NA 137  K 4.2  CL 101  CO2 25  GLUCOSE 181*  BUN 15  CREATININE 1.09  CALCIUM 9.0   ------------------------------------------------------------------------------------------------------------------  Cardiac Enzymes No results for input(s): "TROPONINI" in the last 168 hours. ------------------------------------------------------------------------------------------------------------------  RADIOLOGY:  CT Angio Chest PE W/Cm &/Or Wo Cm  Result Date: 05/20/2023 CLINICAL DATA:  Pulmonary embolism suspected. Positive D-dimer with low to intermediate probability. Presents with shortness of breath and right lower extremity pain. EXAM: CT ANGIOGRAPHY CHEST WITH CONTRAST TECHNIQUE: Multidetector CT imaging of the chest was performed using the  standard protocol during bolus administration of intravenous contrast. Multiplanar CT image reconstructions and MIPs were obtained to evaluate the vascular anatomy. RADIATION DOSE REDUCTION: This exam was performed according to the departmental dose-optimization program which includes automated exposure control, adjustment of the mA and/or kV according to patient size and/or use of iterative reconstruction technique. CONTRAST:  OMNIPAQUE IOHEXOL 350 MG/ML SOLN COMPARISON:  CTA abdomen and pelvis 04/05/2023.  No prior chest CT. FINDINGS: Cardiovascular: The pulmonary arteries are within normal caliber limits. The RV/LV ratio is within normal limits at 0.88. There is nonoccluding right upper lobe apical segmental embolus extending into at least 2 subsegmental branches, best seen on series 4 axial images 53-62. There is additional partially occlusive thrombus seen in the distal interlobar artery with extension into the right middle lobe medial and lateral segmental main arteries. In the right lower lobe additional thrombus is seen in at least 2 segmental divisions to the posterior basal lower lobe and in several downstream subsegmental arteries. The overall clot burden is small without evidence of acute right heart strain. There is no large central embolus or further embolus seen. There is mild cardiomegaly with a left chamber predominance. Scattered calcification in the LAD and right coronary arteries. There is mild aortic atherosclerosis without aneurysm, dissection or stenosis. The great vessels are clear. Pulmonary veins are normal caliber. No pericardial effusion. Mediastinum/Nodes: Small hiatal hernia. Unremarkable thoracic esophagus, thoracic trachea. Clear main bronchi. No intrathoracic or axillary adenopathy or thyroid mass. Lungs/Pleura: There is diffuse bronchial thickening. There is mild posterior atelectasis in the lower lobes. The lungs are clear of infiltrates and nodules. There is no pleural  effusion, thickening or pneumothorax. Chronic linear scarring and cystic change in the anterior base of the right middle lobe. Upper Abdomen: Hepatic steatosis. No acute abnormality. Old cholecystectomy. Small renal cysts. Musculoskeletal: There is degenerative change of the spine slight reverse S shaped thoracic scoliosis. Mild osteopenia. No acute skeletal findings or chest wall mass. Review of the MIP images confirms the above findings. IMPRESSION: 1. Right-sided pulmonary emboli as described above, but overall small clot burden without evidence of acute right heart strain. 2. Mild cardiomegaly with left chamber predominance. 3. Aortic and coronary artery atherosclerosis. 4. Diffuse bronchial thickening consistent with bronchitis or reactive airways disease. 5. Small hiatal hernia. 6. Hepatic steatosis. 7. Critical Value/emergent results were called by telephone at the time of interpretation on 05/20/2023 at 2:37 am to provider JADE SUNG , who verbally  acknowledged these results. Aortic Atherosclerosis (ICD10-I70.0). Electronically Signed   By: Almira Bar M.D.   On: 05/20/2023 02:41   US Venous Img Lower Unilateral Right (DVT)  Result Date: 05/20/2023 CLINICAL DATA:  Right lower extremity pain for 2 months EXAM: Right LOWER EXTREMITY VENOUS DOPPLER ULTRASOUND TECHNIQUE: Gray-scale sonography with compression, as well as color and duplex ultrasound, were performed to evaluate the deep venous system(s) from the level of the common femoral vein through the popliteal and proximal calf veins. COMPARISON:  Lower extremity ultrasound 01/08/2017 FINDINGS: VENOUS Normal compressibility of the common femoral, superficial femoral, and popliteal veins. The calf veins are poorly visualized. Visualized portions of profunda femoral vein and great saphenous vein unremarkable. No filling defects to suggest DVT on grayscale or color Doppler imaging. Doppler waveforms show normal direction of venous flow, normal respiratory  plasticity and response to augmentation. Limited views of the contralateral common femoral vein are unremarkable. OTHER None. Limitations: none IMPRESSION: Negative. Electronically Signed   By: Minerva Fester M.D.   On: 05/20/2023 00:02   DG Chest Port 1 View  Result Date: 05/19/2023 CLINICAL DATA:  Shortness of breath EXAM: PORTABLE CHEST 1 VIEW COMPARISON:  Radiographs 06/07/2019 FINDINGS: Stable cardiomegaly. Pulmonary vascular congestion. Right basilar atelectasis or infiltrates. No pleural effusion or pneumothorax. No displaced rib fractures. IMPRESSION: 1. Cardiomegaly with pulmonary vascular congestion. 2. Right basilar atelectasis or infiltrates. Electronically Signed   By: Minerva Fester M.D.   On: 05/19/2023 22:18      IMPRESSION AND PLAN:  Assessment and Plan: * Acute pulmonary embolism (HCC) - The patient will be admitted to a progressive unit bed. - We will continue on IV heparin. - We will obtain 2D echo to assess for RV strain. - Will monitor O2 requirement.   Cellulitis of right lower extremity - I will place him on IV clindamycin given his penicillin allergy. - Warm compresses will be utilized.  Dyslipidemia - We will continue statin therapy.  Type 2 diabetes mellitus without complications (HCC) - The patient will be placed on supplemental coverage with NovoLog. - We will hold off metformin.  Essential hypertension - We will continue his hypertensives.     DVT prophylaxis: IV heparin. Advanced Care Planning:  Code Status: full code. Family Communication:  The plan of care was discussed in details with the patient (and family). I answered all questions. The patient agreed to proceed with the above mentioned plan. Further management will depend upon hospital course. Disposition Plan: Back to previous home environment Consults called: none. All the records are reviewed and case discussed with ED provider.  Status is: Inpatient   At the time of the  admission, it appears that the appropriate admission status for this patient is inpatient.  This is judged to be reasonable and necessary in order to provide the required intensity of service to ensure the patient's safety given the presenting symptoms, physical exam findings and initial radiographic and laboratory data in the context of comorbid conditions.  The patient requires inpatient status due to high intensity of service, high risk of further deterioration and high frequency of surveillance required.  I certify that at the time of admission, it is my clinical judgment that the patient will require inpatient hospital care extending more than 2 midnights.                            Dispo: The patient is from: Home  Anticipated d/c is to: Home              Patient currently is not medically stable to d/c.              Difficult to place patient: No  Hannah Beat M.D on 05/20/2023 at 5:39 AM  [facility]   From 7 PM-7 AM, contact night-coverage [email or web address]  CC: Primary care physician; Mick Sell, MD

## 2023-05-20 NOTE — Progress Notes (Signed)
ANTICOAGULATION CONSULT NOTE  Pharmacy Consult for Heparin Infusion Indication: pulmonary embolus  Patient Measurements: Height: 6\' 4"  (193 cm) Weight: 127.9 kg (282 lb) IBW/kg (Calculated) : 86.8 Heparin Dosing Weight: 114.3 kg  Labs: Recent Labs    05/19/23 2159 05/20/23 0148 05/20/23 0328 05/20/23 0600 05/20/23 1100  HGB 13.1  --   --   --   --   HCT 42.4  --   --   --   --   PLT 333  --   --   --   --   APTT  --   --  113*  --   --   LABPROT  --   --  15.8*  --   --   INR  --   --  1.2  --   --   HEPARINUNFRC  --   --   --   --  0.30  CREATININE 1.09  --   --   --   --   TROPONINIHS 20* 37*  --  47*  --     Estimated Creatinine Clearance: 81.5 mL/min (by C-G formula based on SCr of 1.09 mg/dL).  Medical History: Past Medical History:  Diagnosis Date   Adenomatous polyp of colon    Arthritis    xDJD   Atrial fibrillation (HCC)    BPH (benign prostatic hyperplasia)    CHF (congestive heart failure) (HCC)    COPD (chronic obstructive pulmonary disease) (HCC)    Diabetes mellitus without complication (HCC)    type 2   Discitis    Diverticulitis    Diverticulosis    DVT (deep venous thrombosis) (HCC)    bilateral tibial   Erectile dysfunction    Hearing loss    Hyperlipidemia    Hypertension    Ischemic optic neuropathy    Lower GI bleeding    Lower GI bleeding 02/23/2018   Lumbar degenerative disc disease    Non-ischemic cardiomyopathy (HCC)    Sleep apnea    Assessment: Pt is a 78 yo male presenting to ED c/o SOB & R leg pain/numbness found w/  "nonoccluding right upper lobe apical segmental embolus extending into at least 2 subsegmental branches."  0520 1100 HL 0.30, therapeutic x 1; 1900 un/hr  Goal of Therapy:  Heparin level 0.3-0.7 units/ml Monitor platelets by anticoagulation protocol: Yes   Plan:  --Heparin level is therapeutic x 1 albeit at lower limit of desired therapeutic range --Will continue heparin infusion at current rate of 1900  units/hr --Re-check HL in 8 hours --Daily CBC per protocol while on IV heparin  Tressie Ellis 05/20/2023 12:13 PM

## 2023-05-20 NOTE — Telephone Encounter (Signed)
Pharmacy Patient Advocate Encounter  Insurance verification completed.    The patient is insured through [employer]   The patient is currently admitted and ran test claims for the following: Eliquis, Xarelto.  Copays and coinsurance results were relayed to Inpatient clinical team.

## 2023-05-20 NOTE — Progress Notes (Signed)
*  PRELIMINARY RESULTS* Echocardiogram 2D Echocardiogram has been performed.  Evan [name] 05/20/2023, 4:18 PM

## 2023-05-20 NOTE — Assessment & Plan Note (Signed)
-   I will place him on IV clindamycin given his penicillin allergy. - Warm compresses will be utilized.

## 2023-05-20 NOTE — Consult Note (Addendum)
Pharmacy Antibiotic Note  Evan [name] is a 78 y.o. male admitted on 05/20/2023 with  acute PE and RLE cellulitis . Pharmacy has been consulted for vancomycin dosing.  Plan:  Patient received loading dose of vancomycin 2 g IV 5/20 at 0531 --Appears to have mild AKI with Scr 1.09 (baseline 0.8) --Defer re-dosing today, plan to re-assess kidney function in AM and anticipate starting maintenance regimen at that time if Scr improves towards baseline. If Scr worsens, consider level --Daily Scr per protocol, levels at steady state or as clinically indicated  Height: 6\' 4"  (193 cm) Weight: 127.9 kg (282 lb) IBW/kg (Calculated) : 86.8  Temp (24hrs), Avg:99.9 F (37.7 C), Min:99 F (37.2 C), Max:101.3 F (38.5 C)  Recent Labs  Lab 05/19/23 2159 05/20/23 0328 05/20/23 0530  WBC 14.0*  --   --   CREATININE 1.09  --   --   LATICACIDVEN  --  1.7 1.7    Estimated Creatinine Clearance: 81.5 mL/min (by C-G formula based on SCr of 1.09 mg/dL).    Allergies  Allergen Reactions   Azithromycin Rash   Penicillins Rash    Has patient had a PCN reaction causing immediate rash, facial/tongue/throat swelling, SOB or lightheadedness with hypotension: Yes Has patient had a PCN reaction causing severe rash involving mucus membranes or skin necrosis: Yes Has patient had a PCN reaction that required hospitalization No Has patient had a PCN reaction occurring within the last 10 years: No If all of the above answers are "NO", then may proceed with Cephalosporin use.      Lisinopril Cough   Losartan Cough   Metoprolol Other (See Comments)    Bradycardia.    Antimicrobials this admission: Levofloxacin 5/20 x 1 Vancomycin 5/20 >>   Dose adjustments this admission: N/A  Microbiology results: 5/20 BCx: pending  Thank you for allowing pharmacy to be a part of this patient's care.  Tressie Ellis 05/20/2023 10:34 AM

## 2023-05-20 NOTE — Assessment & Plan Note (Signed)
-   We will continue his hypertensives. 

## 2023-05-20 NOTE — TOC Benefit Eligibility Note (Signed)
Patient Product/process development scientist completed.    The patient is currently admitted and upon discharge could be taking Eliquis.  The current 30 day co-pay is $22.00.   The patient is currently admitted and upon discharge could be taking Xarelto.  The current 30 day co-pay is $22.00.   The patient is insured through [employer]   This test claim was processed through [facility]- copay amounts may vary at other pharmacies due to pharmacy/plan contracts, or as the patient moves through the different stages of their insurance plan.

## 2023-05-20 NOTE — Assessment & Plan Note (Signed)
-   The patient will be placed on supplemental coverage with NovoLog. - We will hold off metformin. 

## 2023-05-20 NOTE — Assessment & Plan Note (Signed)
-   The patient was admitted to a

## 2023-05-20 NOTE — Progress Notes (Signed)
ANTICOAGULATION CONSULT NOTE  Pharmacy Consult for Heparin Infusion Indication: pulmonary embolus  Patient Measurements: Height: 6\' 4"  (193 cm) Weight: 127.9 kg (282 lb) IBW/kg (Calculated) : 86.8 Heparin Dosing Weight: 114.3 kg  Labs: Recent Labs    05/19/23 2159 05/20/23 0148 05/20/23 0328 05/20/23 0600 05/20/23 1100 05/20/23 1854  HGB 13.1  --   --   --   --   --   HCT 42.4  --   --   --   --   --   PLT 333  --   --   --   --   --   APTT  --   --  113*  --   --   --   LABPROT  --   --  15.8*  --   --   --   INR  --   --  1.2  --   --   --   HEPARINUNFRC  --   --   --   --  0.30 0.40  CREATININE 1.09  --   --   --   --   --   TROPONINIHS 20* 37*  --  47*  --   --     Estimated Creatinine Clearance: 81.5 mL/min (by C-G formula based on SCr of 1.09 mg/dL).  Medical History: Past Medical History:  Diagnosis Date   Adenomatous polyp of colon    Arthritis    xDJD   Atrial fibrillation (HCC)    BPH (benign prostatic hyperplasia)    CHF (congestive heart failure) (HCC)    COPD (chronic obstructive pulmonary disease) (HCC)    Diabetes mellitus without complication (HCC)    type 2   Discitis    Diverticulitis    Diverticulosis    DVT (deep venous thrombosis) (HCC)    bilateral tibial   Erectile dysfunction    Hearing loss    Hyperlipidemia    Hypertension    Ischemic optic neuropathy    Lower GI bleeding    Lower GI bleeding 02/23/2018   Lumbar degenerative disc disease    Non-ischemic cardiomyopathy (HCC)    Sleep apnea    Assessment: Pt is a 78 yo male presenting to ED c/o SOB & R leg pain/numbness found w/  "nonoccluding right upper lobe apical segmental embolus extending into at least 2 subsegmental branches."  0520 1100 HL 0.30, therapeutic x 1; 1900 un/hr 0520 1854 HL 0.40, therapeutic x 2, 1900 un/hr  Goal of Therapy:  Heparin level 0.3-0.7 units/ml Monitor platelets by anticoagulation protocol: Yes   Plan:  --Heparin level is therapeutic x 2  and well within therapeutic range --Will continue heparin infusion at current rate of 1900 units/hr --Transition to daily levels. Next HL tomorrow AM --Daily CBC per protocol while on IV heparin  Jaynie Bream 05/20/2023 7:31 PM

## 2023-05-20 NOTE — Progress Notes (Signed)
PHARMACY -  BRIEF ANTIBIOTIC NOTE   Pharmacy has received consult(s) for Vancomycin from an ED provider.  The patient's profile has been reviewed for ht/wt/allergies/indication/available labs.    One time order(s) placed for Vancomycin 2 gm per pt wt: 127.9 kg.  Further antibiotics/pharmacy consults should be ordered by admitting physician if indicated.                       Thank you, Otelia Sergeant, PharmD, [facility] 05/20/2023 3:04 AM

## 2023-05-20 NOTE — Assessment & Plan Note (Signed)
-   We will continue statin therapy. 

## 2023-05-20 NOTE — ED Provider Notes (Signed)
[facility] Provider Note    Event Date/Time   First MD Initiated Contact with Patient 05/20/23 [phone]     (approximate)   History   Shortness of Breath   HPI  Evan [name] is a 78 y.o. male who presents to the ED from home with a chief complaint of shortness of breath and right leg pain.  Patient reports chronic right leg pain and numbness x 1 year.  Thinks he has a blood clot because his calf feels swollen and hot.  Shortness of breath x 1 day.  Denies fever/chills, cough, chest pain, abdominal pain, nausea, vomiting or dizziness.     Past Medical History   Past Medical History:  Diagnosis Date   Adenomatous polyp of colon    Arthritis    xDJD   Atrial fibrillation (HCC)    BPH (benign prostatic hyperplasia)    CHF (congestive heart failure) (HCC)    COPD (chronic obstructive pulmonary disease) (HCC)    Diabetes mellitus without complication (HCC)    type 2   Discitis    Diverticulitis    Diverticulosis    DVT (deep venous thrombosis) (HCC)    bilateral tibial   Erectile dysfunction    Hearing loss    Hyperlipidemia    Hypertension    Ischemic optic neuropathy    Lower GI bleeding    Lower GI bleeding 02/23/2018   Lumbar degenerative disc disease    Non-ischemic cardiomyopathy (HCC)    Sleep apnea      Active Problem List   Patient Active Problem List   Diagnosis Date Noted   Chronic diastolic CHF (congestive heart failure) (HCC) 04/05/2023   Sensorineural hearing loss, bilateral 06/21/2022   Encounter for fitting and adjustment of hearing aid 06/21/2022   Status post total hip replacement, right 04/24/2022   Severe obesity (BMI 35.0-39.9) with comorbidity (HCC) 05/11/2020   Iron deficiency anemia 03/07/2018   Chronic insomnia 02/27/2018   Diverticulosis of large intestine without diverticulitis    Near syncope 02/22/2018   Hematochezia 02/16/2018   Acute deep vein thrombosis (DVT) of popliteal vein of right lower extremity  (HCC) 01/24/2017   Ischemic optic neuropathy of left eye 01/24/2017   DVT (deep venous thrombosis) (HCC) 01/22/2017   History of deep vein thrombosis (DVT) of lower extremity 01/22/2017   S/P partial colectomy 01/21/2017   Acute posthemorrhagic anemia 01/06/2017   Encounter for blood transfusion 01/06/2017   Leukocytosis 01/06/2017   Hypokalemia 01/06/2017   History of GI diverticular bleed    BRBPR (bright red blood per rectum) 12/27/2016   Stage 2 moderate COPD by GOLD classification (HCC) 05/03/2016   High risk medication use 05/04/2015   Sleep apnea 05/04/2015   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) 11/07/2014   Hearing loss 11/07/2014   Hypercholesteremia 11/07/2014   Hypertension 11/07/2014   Lumbar degenerative disc disease 11/07/2014   Proteinuria due to type 2 diabetes mellitus (HCC) 11/07/2014   Arthrosis of knee 06/22/2014   Degenerative joint disease of knee, left 05/06/2014   Status post total right knee replacement 05/06/2014   Aortic atherosclerosis (HCC) 05/05/2010   History of adenomatous polyp of colon 01/31/2007     Past Surgical History   Past Surgical History:  Procedure Laterality Date   ATRIAL ABLATION SURGERY  2010   CATARACT EXTRACTION W/PHACO Right 10/10/2020   Procedure: CATARACT EXTRACTION PHACO AND INTRAOCULAR LENS PLACEMENT (IOC) RIGHT ISTENT INJ DIABETIC 2.64  00:29.4;  Surgeon:  Nevada Crane, MD;  Location: [facility];  Service: Ophthalmology;  Laterality: Right;  Diabetic - oral meds   CATARACT EXTRACTION W/PHACO Left 03/06/2021   Procedure: CATARACT EXTRACTION PHACO AND INTRAOCULAR LENS PLACEMENT (IOC) LEFT DIABETIC ISTENT INJ 3.62 00:30.6;  Surgeon: Nevada Crane, MD;  Location: [facility];  Service: Ophthalmology;  Laterality: Left;  Diabetic - oral meds   CHOLECYSTECTOMY  2011   COLONOSCOPY Left 02/24/2018   Procedure: COLONOSCOPY;  Surgeon: Pasty Spillers, MD;   Location: [facility] ENDOSCOPY;  Service: Endoscopy;  Laterality: Left;   COLONOSCOPY     2008, 2010, 2013   COLONOSCOPY N/A 04/06/2023   Procedure: COLONOSCOPY;  Surgeon: Toledo, Boykin Nearing, MD;  Location: [facility] ENDOSCOPY;  Service: Gastroenterology;  Laterality: N/A;   COLONOSCOPY WITH PROPOFOL N/A 09/25/2017   Procedure: COLONOSCOPY WITH PROPOFOL;  Surgeon: Scot Jun, MD;  Location: [facility]COPY;  Service: Endoscopy;  Laterality: N/A;   COLONOSCOPY WITH PROPOFOL N/A 02/18/2018   Procedure: COLONOSCOPY WITH PROPOFOL;  Surgeon: Midge Minium, MD;  Location: [facility] ENDOSCOPY;  Service: Endoscopy;  Laterality: N/A;   ERCP  2011   ESOPHAGOGASTRODUODENOSCOPY Left 02/23/2018   Procedure: ESOPHAGOGASTRODUODENOSCOPY (EGD);  Surgeon: Pasty Spillers, MD;  Location: [facility] END[facility];  Service: Endoscopy;  Laterality: Left;   ESOPHAGOGASTRODUODENOSCOPY  2014   IVC FILTER INSERTION N/A 04/17/2022   Procedure: IVC FILTER INSERTION;  Surgeon: Renford Dills, MD;  Location: [facility] IN[facility];  Service: Cardiovascular;  Laterality: N/A;   IVC FILTER REMOVAL N/A 07/31/2022   Procedure: IVC FILTER REMOVAL;  Surgeon: Renford Dills, MD;  Location: [facility];  Service: Cardiovascular;  Laterality: N/A;   LAPAROTOMY  01/01/2017   Procedure: EXPLORATORY LAPAROTOMY;  Surgeon: Henrene Dodge, MD;  Location: [facility] ORS;  Service: General;;   PARTIAL COLECTOMY N/A 01/01/2017   Procedure: PARTIAL COLECTOMY;  Surgeon: Henrene Dodge, MD;  Location: [facility] ORS;  Service: General;  Laterality: N/A;   PERIPHERAL VASCULAR CATHETERIZATION N/A 12/28/2016   Procedure: Visceral Artery Intervention;  Surgeon: Annice Needy, MD;  Location: [facility] IN[facility] CV LAB;  Service: Cardiovascular;  Laterality: N/A;   SECONDARY CLOSURE OF WOUND N/A 01/08/2017   Procedure: SECONDARY CLOSURE FOR EVISCERATION;  Surgeon: Lattie Haw, MD;  Location: [facility] ORS;  Service: General;  Laterality: N/A;   SIGMOIDECTOMY  2018   TOTAL HIP  ARTHROPLASTY Right 04/24/2022   Procedure: TOTAL HIP ARTHROPLASTY ANTERIOR APPROACH;  Surgeon: Kennedy Bucker, MD;  Location: [facility] ORS;  Service: Orthopedics;  Laterality: Right;   TOTAL KNEE ARTHROPLASTY Right 03/24/2014   times 4     Home Medications   Prior to Admission medications   Medication Sig Start Date End Date Taking? Authorizing Provider  acetaminophen (TYLENOL) 500 MG tablet Take 1,000 mg by mouth every 8 (eight) hours as needed.    [provider]  albuterol (VENTOLIN HFA) 108 (90 Base) MCG/ACT inhaler SMARTSIG:2 inhalation Via Inhaler Every 6 Hours PRN 07/25/22   [provider]  amLODipine (NORVASC) 10 MG tablet Take 10 mg by mouth daily.    [provider]  aspirin EC 81 MG EC tablet Take 1 tablet (81 mg total) by mouth daily. 01/16/17   Gladis Riffle, MD  Cholecalciferol (VITAMIN D3) 2000 units capsule Take 2,000 Units by mouth daily.    [provider]  hydrOXYzine (ATARAX/VISTARIL) 25 MG tablet Take 25 mg by mouth at bedtime as needed (sleep).    [provider]  metFORMIN (GLUCOPHAGE-XR) 500 MG  24 hr tablet 1,000 mg daily with breakfast.  08/30/17   [provider]  Multiple Vitamins-Minerals (CENTRUM SILVER PO) Take 1 tablet by mouth every morning.    [provider]  polyethylene glycol (MIRALAX / GLYCOLAX) packet Take 17 g by mouth daily. Patient taking differently: Take 17 g by mouth daily as needed for mild constipation. 02/25/18   Ramonita Lab, MD  pravastatin (PRAVACHOL) 40 MG tablet Take 40 mg by mouth at bedtime. 05/03/16   [provider]  vitamin B-12 (CYANOCOBALAMIN) 1000 MCG tablet Take 1,000 mcg by mouth daily.    [provider]     Allergies  Azithromycin, Penicillins, Lisinopril, Losartan, and Metoprolol   Family History   Family History  Problem Relation Age of Onset   Diabetes Sister    Ovarian cancer Sister    Diabetes Sister    Diabetes Brother    Heart attack  Brother    Diabetes Brother      Physical Exam  Triage Vital Signs: ED Triage Vitals  Enc Vitals Group     BP 05/19/23 2141 138/73     Pulse Rate 05/19/23 2141 (!) 110     Resp 05/19/23 2141 (!) 24     Temp 05/19/23 2137 99 F (37.2 C)     Temp Source 05/19/23 2137 Oral     SpO2 05/19/23 2141 97 %     Weight 05/19/23 2142 282 lb (127.9 kg)     Height 05/19/23 2142 6\' 4"  (1.93 m)     Head Circumference --      Peak Flow --      Pain Score 05/19/23 2142 6     Pain Loc --      Pain Edu? --      Excl. in GC? --     Updated Vital Signs: BP (!) 164/68   Pulse (!) 103   Temp 99 F (37.2 C) (Oral)   Resp 20   Ht 6\' 4"  (1.93 m)   Wt 127.9 kg   SpO2 96%   BMI 34.33 kg/m    General: Awake, no distress.  CV:  RRR.  Good peripheral perfusion.  Resp:  Normal effort.  CTAB. Abd:  Nontender.  No distention.  Other:  Right lower leg warm and erythematous.  Right calf is swollen and tender.  Palpable distal pulses.   ED Results / Procedures / Treatments  Labs (all labs ordered are listed, but only abnormal results are displayed) Labs Reviewed  BASIC METABOLIC PANEL - Abnormal; Notable for the following components:      Result Value   Glucose, Bld 181 (*)    All other components within normal limits  CBC - Abnormal; Notable for the following components:   WBC 14.0 (*)    MCH 25.0 (*)    All other components within normal limits  D-DIMER, QUANTITATIVE - Abnormal; Notable for the following components:   D-Dimer, Quant 3.41 (*)    All other components within normal limits  TROPONIN I (HIGH SENSITIVITY) - Abnormal; Notable for the following components:   Troponin I (High Sensitivity) 20 (*)    All other components within normal limits  TROPONIN I (HIGH SENSITIVITY) - Abnormal; Notable for the following components:   Troponin I (High Sensitivity) 37 (*)    All other components within normal limits  CULTURE, BLOOD (ROUTINE X 2)  CULTURE, BLOOD (ROUTINE X 2)  BRAIN  NATRIURETIC PEPTIDE  LACTIC ACID, PLASMA  LACTIC ACID, PLASMA  EKG  ED ECG REPORT I, Taevon Aschoff J, the attending physician, personally viewed and interpreted this ECG.   Date: 05/20/2023  EKG Time: 2153  Rate: 120  Rhythm: sinus tachycardia  Axis: LAD  Intervals:left bundle branch block  ST&T Change: Nonspecific    RADIOLOGY I have independently visualized and interpreted patient's x-ray, ultrasound and CTA chest as well as noted the radiology interpretation:  Chest x-ray: Cardiomegaly with pulmonary vascular congestion, right basilar atelectasis versus infiltrates  Ultrasound: Negative for DVT  CTA chest discussed with radiologist demonstrates right-sided pulmonary emboli without acute right heart strain  Official radiology report(s): CT Angio Chest PE W/Cm &/Or Wo Cm  Result Date: 05/20/2023 CLINICAL DATA:  Pulmonary embolism suspected. Positive D-dimer with low to intermediate probability. Presents with shortness of breath and right lower extremity pain. EXAM: CT ANGIOGRAPHY CHEST WITH CONTRAST TECHNIQUE: Multidetector CT imaging of the chest was performed using the standard protocol during bolus administration of intravenous contrast. Multiplanar CT image reconstructions and MIPs were obtained to evaluate the vascular anatomy. RADIATION DOSE REDUCTION: This exam was performed according to the departmental dose-optimization program which includes automated exposure control, adjustment of the mA and/or kV according to patient size and/or use of iterative reconstruction technique. CONTRAST:  OMNIPAQUE IOHEXOL 350 MG/ML SOLN COMPARISON:  CTA abdomen and pelvis 04/05/2023.  No prior chest CT. FINDINGS: Cardiovascular: The pulmonary arteries are within normal caliber limits. The RV/LV ratio is within normal limits at 0.88. There is nonoccluding right upper lobe apical segmental embolus extending into at least 2 subsegmental branches, best seen on series 4 axial images 53-62.  There is additional partially occlusive thrombus seen in the distal interlobar artery with extension into the right middle lobe medial and lateral segmental main arteries. In the right lower lobe additional thrombus is seen in at least 2 segmental divisions to the posterior basal lower lobe and in several downstream subsegmental arteries. The overall clot burden is small without evidence of acute right heart strain. There is no large central embolus or further embolus seen. There is mild cardiomegaly with a left chamber predominance. Scattered calcification in the LAD and right coronary arteries. There is mild aortic atherosclerosis without aneurysm, dissection or stenosis. The great vessels are clear. Pulmonary veins are normal caliber. No pericardial effusion. Mediastinum/Nodes: Small hiatal hernia. Unremarkable thoracic esophagus, thoracic trachea. Clear main bronchi. No intrathoracic or axillary adenopathy or thyroid mass. Lungs/Pleura: There is diffuse bronchial thickening. There is mild posterior atelectasis in the lower lobes. The lungs are clear of infiltrates and nodules. There is no pleural effusion, thickening or pneumothorax. Chronic linear scarring and cystic change in the anterior base of the right middle lobe. Upper Abdomen: Hepatic steatosis. No acute abnormality. Old cholecystectomy. Small renal cysts. Musculoskeletal: There is degenerative change of the spine slight reverse S shaped thoracic scoliosis. Mild osteopenia. No acute skeletal findings or chest wall mass. Review of the MIP images confirms the above findings. IMPRESSION: 1. Right-sided pulmonary emboli as described above, but overall small clot burden without evidence of acute right heart strain. 2. Mild cardiomegaly with left chamber predominance. 3. Aortic and coronary artery atherosclerosis. 4. Diffuse bronchial thickening consistent with bronchitis or reactive airways disease. 5. Small hiatal hernia. 6. Hepatic steatosis. 7. Critical  Value/emergent results were called by telephone at the time of interpretation on 05/20/2023 at 2:37 am to provider Jenet Durio , who verbally acknowledged these results. Aortic Atherosclerosis (ICD10-I70.0). Electronically Signed   By: Almira Bar M.D.   On: 05/20/2023 02:41  US Venous Img Lower Unilateral Right (DVT)  Result Date: 05/20/2023 CLINICAL DATA:  Right lower extremity pain for 2 months EXAM: Right LOWER EXTREMITY VENOUS DOPPLER ULTRASOUND TECHNIQUE: Gray-scale sonography with compression, as well as color and duplex ultrasound, were performed to evaluate the deep venous system(s) from the level of the common femoral vein through the popliteal and proximal calf veins. COMPARISON:  Lower extremity ultrasound 01/08/2017 FINDINGS: VENOUS Normal compressibility of the common femoral, superficial femoral, and popliteal veins. The calf veins are poorly visualized. Visualized portions of profunda femoral vein and great saphenous vein unremarkable. No filling defects to suggest DVT on grayscale or color Doppler imaging. Doppler waveforms show normal direction of venous flow, normal respiratory plasticity and response to augmentation. Limited views of the contralateral common femoral vein are unremarkable. OTHER None. Limitations: none IMPRESSION: Negative. Electronically Signed   By: Minerva Fester M.D.   On: 05/20/2023 00:02   DG Chest Port 1 View  Result Date: 05/19/2023 CLINICAL DATA:  Shortness of breath EXAM: PORTABLE CHEST 1 VIEW COMPARISON:  Radiographs 06/07/2019 FINDINGS: Stable cardiomegaly. Pulmonary vascular congestion. Right basilar atelectasis or infiltrates. No pleural effusion or pneumothorax. No displaced rib fractures. IMPRESSION: 1. Cardiomegaly with pulmonary vascular congestion. 2. Right basilar atelectasis or infiltrates. Electronically Signed   By: Minerva Fester M.D.   On: 05/19/2023 22:18     PROCEDURES:  Critical Care performed: Yes, see critical care procedure  note(s)  CRITICAL CARE Performed by: Irean Hong   Total critical care time: 45 minutes  Critical care time was exclusive of separately billable procedures and treating other patients.  Critical care was necessary to treat or prevent imminent or life-threatening deterioration.  Critical care was time spent personally by me on the following activities: development of treatment plan with patient and/or surrogate as well as nursing, discussions with consultants, evaluation of patient's response to treatment, examination of patient, obtaining history from patient or surrogate, ordering and performing treatments and interventions, ordering and review of laboratory studies, ordering and review of radiographic studies, pulse oximetry and re-evaluation of patient's condition.   [name]1-3 Lead EKG Interpretation  Performed by: Irean Hong, MD Authorized by: Irean Hong, MD     Interpretation: abnormal     ECG rate:  105   ECG rate assessment: tachycardic     Rhythm: sinus tachycardia     Ectopy: none     Conduction: normal   Comments:     Patient placed on cardiac monitor to evaluate for arrhythmias    MEDICATIONS ORDERED IN ED: Medications  levofloxacin (LEVAQUIN) IVPB 750 mg (has no administration in time range)  iohexol (OMNIPAQUE) 350 MG/ML injection 100 mL (100 mLs Intravenous Contrast Given 05/20/23 0207)     IMPRESSION / MDM / ASSESSMENT AND PLAN / ED COURSE  I reviewed the triage vital signs and the nursing notes.                             78 year old male presenting with right leg pain/redness/swelling and shortness of breath. Differential includes, but is not limited to, viral syndrome, bronchitis including COPD exacerbation, pneumonia, reactive airway disease including asthma, CHF including exacerbation with or without pulmonary/interstitial edema, pneumothorax, ACS, thoracic trauma, and pulmonary embolism.  Personally reviewed patient's records and note a recent  hospitalization on 04/05/2023 for lower GI bleed.  No cause was found for bleeding and patient was allowed to resume his daily aspirin.  Patient denies rectal bleeding  since that time.  Patient's presentation is most consistent with acute presentation with potential threat to life or bodily function.  The patient is on the cardiac monitor to evaluate for evidence of arrhythmia and/or significant heart rate changes.  Laboratory results demonstrate moderate leukocytosis WBC 14, increasing troponins likely secondary to demand ischemia.  No DVT on ultrasound.  CTA chest is positive for scattered pulmonary emboli without right heart strain.  We discussed risk/benefits of initiating heparin given patient's hospitalization last month for GI bleed.  At this time we agreed to proceed with anticoagulation.  Will obtain blood cultures and lactic acid, and start empiric IV antibiotics for right lower leg cellulitis.  Will consult hospitalist services for evaluation and admission.  Patient placed on 2 L nasal cannula oxygen for increased work of breathing; feels better with oxygen.      FINAL CLINICAL IMPRESSION(S) / ED DIAGNOSES   Final diagnoses:  Shortness of breath  Other acute pulmonary embolism without acute cor pulmonale (HCC)  Cellulitis of right lower extremity     Rx / DC Orders   ED Discharge Orders     None        Note:  This document was prepared using Dragon voice recognition software and may include unintentional dictation errors.   Irean Hong, MD 05/20/23 [phone]

## 2023-05-20 NOTE — ED Notes (Signed)
CT notified pt has 20G IV 

## 2023-05-20 NOTE — Progress Notes (Signed)
ANTICOAGULATION CONSULT NOTE  Pharmacy Consult for heparin infusion Indication: pulmonary embolus  Allergies  Allergen Reactions   Azithromycin Rash   Penicillins Rash    Has patient had a PCN reaction causing immediate rash, facial/tongue/throat swelling, SOB or lightheadedness with hypotension: Yes Has patient had a PCN reaction causing severe rash involving mucus membranes or skin necrosis: Yes Has patient had a PCN reaction that required hospitalization No Has patient had a PCN reaction occurring within the last 10 years: No If all of the above answers are "NO", then may proceed with Cephalosporin use.      Lisinopril Cough   Losartan Cough   Metoprolol Other (See Comments)    Bradycardia.    Patient Measurements: Height: 6\' 4"  (193 cm) Weight: 127.9 kg (282 lb) IBW/kg (Calculated) : 86.8 Heparin Dosing Weight: 114.3 kg  Vital Signs: Temp: 99 F (37.2 C) (05/19 2137) Temp Source: Oral (05/19 2137) BP: 164/68 (05/20 0143) Pulse Rate: 103 (05/20 0143)  Labs: Recent Labs    05/19/23 2159 05/20/23 0148  HGB 13.1  --   HCT 42.4  --   PLT 333  --   CREATININE 1.09  --   TROPONINIHS 20* 37*    Estimated Creatinine Clearance: 81.5 mL/min (by C-G formula based on SCr of 1.09 mg/dL).   Medical History: Past Medical History:  Diagnosis Date   Adenomatous polyp of colon    Arthritis    xDJD   Atrial fibrillation (HCC)    BPH (benign prostatic hyperplasia)    CHF (congestive heart failure) (HCC)    COPD (chronic obstructive pulmonary disease) (HCC)    Diabetes mellitus without complication (HCC)    type 2   Discitis    Diverticulitis    Diverticulosis    DVT (deep venous thrombosis) (HCC)    bilateral tibial   Erectile dysfunction    Hearing loss    Hyperlipidemia    Hypertension    Ischemic optic neuropathy    Lower GI bleeding    Lower GI bleeding 02/23/2018   Lumbar degenerative disc disease    Non-ischemic cardiomyopathy (HCC)    Sleep apnea      Assessment: Pt is a 78 yo male presenting to ED c/o SOB & R leg pain/numbness found w/  "nonoccluding right upper lobe apical segmental embolus extending into at least 2 subsegmental branches."  Goal of Therapy:  Heparin level 0.3-0.7 units/ml Monitor platelets by anticoagulation protocol: Yes   Plan:  Bolus 6900 units x 1 Start heparin infusion at 1900 units/hr Will check HL in 8 hr after start of infusion CBC daily while on heparin  Otelia Sergeant, PharmD, [facility] 05/20/2023 2:53 AM

## 2023-05-21 ENCOUNTER — Other Ambulatory Visit (HOSPITAL_COMMUNITY): Payer: Self-pay

## 2023-05-21 ENCOUNTER — Telehealth (HOSPITAL_COMMUNITY): Payer: Self-pay | Admitting: Pharmacy Technician

## 2023-05-21 ENCOUNTER — Telehealth: Payer: Self-pay

## 2023-05-21 DIAGNOSIS — I2699 Other pulmonary embolism without acute cor pulmonale: Secondary | ICD-10-CM | POA: Diagnosis not present

## 2023-05-21 LAB — BASIC METABOLIC PANEL
Anion gap: 7 (ref 5–15)
BUN: 10 mg/dL (ref 8–23)
CO2: 25 mmol/L (ref 22–32)
Calcium: 8 mg/dL — ABNORMAL LOW (ref 8.9–10.3)
Chloride: 102 mmol/L (ref 98–111)
Creatinine, Ser: 0.8 mg/dL (ref 0.61–1.24)
GFR, Estimated: >60 mL/min (ref >60)
Glucose, Bld: 145 mg/dL — ABNORMAL HIGH (ref 70–99)
Potassium: 3.6 mmol/L (ref 3.5–5.1)
Sodium: 134 mmol/L — ABNORMAL LOW (ref 135–145)

## 2023-05-21 LAB — CULTURE, BLOOD (ROUTINE X 2)

## 2023-05-21 LAB — HEPARIN LEVEL (UNFRACTIONATED): Heparin Unfractionated: 0.37 IU/mL (ref 0.30–0.70)

## 2023-05-21 LAB — CBC
HCT: 35.3 % — ABNORMAL LOW (ref 39.0–52.0)
Hemoglobin: 11.4 g/dL — ABNORMAL LOW (ref 13.0–17.0)
MCH: 25.2 pg — ABNORMAL LOW (ref 26.0–34.0)
MCHC: 32.3 g/dL (ref 30.0–36.0)
MCV: 77.9 fL — ABNORMAL LOW (ref 80.0–100.0)
Platelets: 280 10*3/uL (ref 150–400)
RBC: 4.53 MIL/uL (ref 4.22–5.81)
RDW: 14.6 % (ref 11.5–15.5)
WBC: 14.1 10*3/uL — ABNORMAL HIGH (ref 4.0–10.5)
nRBC: 0 % (ref 0.0–0.2)

## 2023-05-21 LAB — GLUCOSE, CAPILLARY: Glucose-Capillary: 114 mg/dL — ABNORMAL HIGH (ref 70–99)

## 2023-05-21 MED ORDER — VANCOMYCIN HCL 1250 MG/250ML IV SOLN
1250.0000 mg | Freq: Two times a day (BID) | INTRAVENOUS | Status: DC
  Administered 2023-05-21: 1250 mg via INTRAVENOUS
  Filled 2023-05-21: qty 250

## 2023-05-21 MED ORDER — POTASSIUM CHLORIDE CRYS ER 20 MEQ PO TBCR
40.0000 meq | EXTENDED_RELEASE_TABLET | Freq: Once | ORAL | Status: AC
  Administered 2023-05-21: 40 meq via ORAL
  Filled 2023-05-21: qty 2

## 2023-05-21 MED ORDER — DAPAGLIFLOZIN PROPANEDIOL 10 MG PO TABS
10.0000 mg | ORAL_TABLET | Freq: Every day | ORAL | Status: DC
  Administered 2023-05-21 – 2023-05-23 (×3): 10 mg via ORAL
  Filled 2023-05-21 (×3): qty 1

## 2023-05-21 MED ORDER — APIXABAN 5 MG PO TABS: 5.0000 mg | ORAL_TABLET | Freq: Two times a day (BID) | ORAL | Status: DC

## 2023-05-21 MED ORDER — APIXABAN 5 MG PO TABS
10.0000 mg | ORAL_TABLET | Freq: Two times a day (BID) | ORAL | Status: DC
  Administered 2023-05-21 – 2023-05-23 (×5): 10 mg via ORAL
  Filled 2023-05-21 (×5): qty 2

## 2023-05-21 MED ORDER — CEFAZOLIN SODIUM-DEXTROSE 2-4 GM/100ML-% IV SOLN
2.0000 g | Freq: Three times a day (TID) | INTRAVENOUS | Status: DC
  Administered 2023-05-21 – 2023-05-23 (×5): 2 g via INTRAVENOUS
  Filled 2023-05-21 (×6): qty 100

## 2023-05-21 NOTE — Progress Notes (Addendum)
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 Reviewed telemetry strip and saw a pair of PVCs.  Pt has heparin infusing for PE in right PIV at this time. Vancomycin is infusing in left PIV at 167 ml/hr.   Last aPTT at 0328 resulted is 113 secs, Nurses do not titrate  heparin drips at this hospital, pharmacy does.

## 2023-05-21 NOTE — Telephone Encounter (Signed)
-----   Message from Dorcas Carrow, RN sent at 05/13/2023  2:58 PM EDT ----- Regarding: Markers and Eligard Good afternoon Ladies,   This patient will need Markers Placed and an Eligard injection.     Thanks, Ricki Rodriguez

## 2023-05-21 NOTE — Progress Notes (Signed)
PROGRESS NOTE    Evan [name]  ZOX:[id number] DOB: March 20, 1945 DOA: 05/20/2023 PCP: Mick Sell, MD    Brief Narrative:  78 year old male history significant for CHF, COPD, diabetes, OSA, BPH who presents to the ED with acute onset of worsening dyspnea x 1 week associated with right lower extremity pain.  Patient has scattered pulmonary emboli with no CT evidence of right heart strain.  Also has a clinical evidence of a right lower extremity cellulitis.  Interestingly right lower extremity ultrasound negative for VTE.  Started on intravenous antibiotics for cellulitis and heparin GTT for pulmonary embolism.   2D echocardiogram reassuring.  No evidence of right heart strain.   Assessment & Plan:   Principal Problem:   Acute pulmonary embolism (HCC) Active Problems:   Cellulitis of right lower extremity   Essential hypertension   Type 2 diabetes mellitus without complications (HCC)   Dyslipidemia   Pulmonary embolism (HCC)  * Acute pulmonary embolism (HCC) Clinically improved.  Shortness of breath resolved.  Weaned off supplemental oxygen.  Echocardiogram reassuring.  No evidence of right heart strain. Plan: DC heparin GTT Start p.o. Eliquis with pharmacy dosing assistance If patient remained stable anticipate discharge 5/22 Ambulate as tolerated  Cellulitis of right lower extremity Nonpurulent.  Unknown trigger.  Right lower extremity negative for DVT. Plan: De-escalate antibiotics Discontinue vancomycin and start IV Ancef with pharmacy dosing assistance If physical exam continues to improve anticipate de-escalation to oral antibiotic therapy and discharged 5/22   Dyslipidemia PTA statin   Type 2 diabetes mellitus without complications (HCC) Home metformin on hold.  Sliding close coverage and carb modified diet while admitted   Essential hypertension PTA blood pressure regimen  DVT prophylaxis: Eliquis Code Status: Full Family Communication: Spouse at  bedside 5/21 Disposition Plan: Status is: Inpatient Remains inpatient appropriate because: Acute PE.  Right lower extremity cellulitis   Level of care: Telemetry Medical  Consultants:  None  Procedures:  None  Antimicrobials: Ancef   Subjective: Seen and examined.  Resting comfortably in bed.  Reports some pain in her right lower extremity but improving over interval.  Shortness of breath resolved.  Objective: Vitals:   05/20/23 1745 05/20/23 1922 05/21/23 0629 05/21/23 0825  BP: 124/75 124/71 132/68 (!) 127/58  Pulse: 83 98 93   Resp: 18 20 18 20   Temp: 99.2 F (37.3 C) 98.9 F (37.2 C)  99.4 F (37.4 C)  TempSrc: Oral Oral  Oral  SpO2: 99% 97% 100% 100%  Weight:      Height:        Intake/Output Summary (Last 24 hours) at 05/21/2023 1211 Last data filed at 05/21/2023 1055 Gross per 24 hour  Intake 1876.03 ml  Output --  Net 1876.03 ml   Filed Weights   05/19/23 2142  Weight: 127.9 kg    Examination:  General exam: Appears calm and comfortable  Respiratory system: Clear to auscultation. Respiratory effort normal. Cardiovascular system: S1-S2, RRR, no murmurs, no pedal edema Gastrointestinal system: Soft, NT/ND, normal bowel sounds Central nervous system: Alert and oriented. No focal neurological deficits. Extremities: Symmetric 5 x 5 power. Skin: Right lower extremity erythematous, tender to touch, warm Psychiatry: Judgement and insight appear normal. Mood & affect appropriate.     Data Reviewed: I have personally reviewed following labs and imaging studies  CBC: Recent Labs  Lab 05/19/23 2159 05/21/23 0449  WBC 14.0* 14.1*  HGB 13.1 11.4*  HCT 42.4 35.3*  MCV 80.9 77.9*  PLT 333 280  Basic Metabolic Panel: Recent Labs  Lab 05/19/23 2159 05/21/23 0449  NA 137 134*  K 4.2 3.6  CL 101 102  CO2 25 25  GLUCOSE 181* 145*  BUN 15 10  CREATININE 1.09 0.80  CALCIUM 9.0 8.0*   GFR: Estimated Creatinine Clearance: 111.1 mL/min (by C-G  formula based on SCr of 0.8 mg/dL). Liver Function Tests: No results for input(s): "AST", "ALT", "ALKPHOS", "BILITOT", "PROT", "ALBUMIN" in the last 168 hours. No results for input(s): "LIPASE", "AMYLASE" in the last 168 hours. No results for input(s): "AMMONIA" in the last 168 hours. Coagulation Profile: Recent Labs  Lab 05/20/23 0328  INR 1.2   Cardiac Enzymes: No results for input(s): "CKTOTAL", "CKMB", "CKMBINDEX", "TROPONINI" in the last 168 hours. BNP (last 3 results) No results for input(s): "PROBNP" in the last 8760 hours. HbA1C: No results for input(s): "HGBA1C" in the last 72 hours. CBG: Recent Labs  Lab 05/21/23 0803  GLUCAP 114*   Lipid Profile: No results for input(s): "CHOL", "HDL", "LDLCALC", "TRIG", "CHOLHDL", "LDLDIRECT" in the last 72 hours. Thyroid Function Tests: No results for input(s): "TSH", "T4TOTAL", "FREET4", "T3FREE", "THYROIDAB" in the last 72 hours. Anemia Panel: No results for input(s): "VITAMINB12", "FOLATE", "FERRITIN", "TIBC", "IRON", "RETICCTPCT" in the last 72 hours. Sepsis Labs: Recent Labs  Lab 05/20/23 0328 05/20/23 0530  LATICACIDVEN 1.7 1.7    Recent Results (from the past 240 hour(s))  Culture, blood (routine x 2)     Status: None (Preliminary result)   Collection Time: 05/20/23  3:28 AM   Specimen: BLOOD RIGHT HAND  Result Value Ref Range Status   Specimen Description BLOOD RIGHT HAND  Final   Special Requests   Final    BOTTLES DRAWN AEROBIC AND ANAEROBIC Blood Culture adequate volume   Culture   Final    NO GROWTH 1 DAY Performed at [facility], [address], [place], [place] [address]    Report Status PENDING  Incomplete  Culture, blood (routine x 2)     Status: None (Preliminary result)   Collection Time: 05/20/23  3:28 AM   Specimen: BLOOD LEFT ARM  Result Value Ref Range Status   Specimen Description BLOOD LEFT ARM  Final   Special Requests   Final    BOTTLES DRAWN AEROBIC AND ANAEROBIC Blood Culture  adequate volume   Culture   Final    NO GROWTH 1 DAY Performed at [facility], [address], [place], [place] [address]    Report Status PENDING  Incomplete         Radiology Studies: ECHOCARDIOGRAM COMPLETE  Result Date: 05/20/2023    ECHOCARDIOGRAM REPORT   Patient Name:   Evan [name] Date of Exam: 05/20/2023 Medical Rec #:  [id number]        Height:       76.0 in Accession #:    [id number]       Weight:       282.0 lb Date of Birth:  Mar 27, 1945        BSA:          2.564 m Patient Age:    77 years         BP:           145/70 mmHg Patient Gender: M                HR:           84 bpm. Exam Location:  [facility] Procedure: 2D Echo, Cardiac Doppler and  Color Doppler Indications:     Pulmonary embolus  History:         Patient has prior history of Echocardiogram examinations, most                  recent 02/24/2018. CHF, COPD, Signs/Symptoms:Syncope; Risk                  Factors:Hypertension, Diabetes and Dyslipidemia. Pulmonary                  embolus, history of ablation.  Sonographer:     Mikki Harbor Referring Phys:  [id number] JAN A MANSY Diagnosing Phys: Lorine Bears MD  Sonographer Comments: Technically difficult study due to poor echo windows and patient is obese. IMPRESSIONS  1. Left ventricular ejection fraction, by estimation, is 50 to 55%. The left ventricle has low normal function. Left ventricular endocardial border not optimally defined to evaluate regional wall motion. The left ventricular internal cavity size was mildly dilated. There is mild left ventricular hypertrophy. Left ventricular diastolic parameters are indeterminate.  2. Right ventricular systolic function is normal. The right ventricular size is normal. Tricuspid regurgitation signal is inadequate for assessing PA pressure.  3. Left atrial size was mildly dilated.  4. Right atrial size was mildly dilated.  5. The mitral valve is normal in structure. No evidence of mitral valve regurgitation. No evidence of  mitral stenosis.  6. The aortic valve is normal in structure. Aortic valve regurgitation is not visualized. Aortic valve sclerosis/calcification is present, without any evidence of aortic stenosis.  7. The inferior vena cava is normal in size with greater than 50% respiratory variability, suggesting right atrial pressure of 3 mmHg. FINDINGS  Left Ventricle: Left ventricular ejection fraction, by estimation, is 50 to 55%. The left ventricle has low normal function. Left ventricular endocardial border not optimally defined to evaluate regional wall motion. The left ventricular internal cavity  size was mildly dilated. There is mild left ventricular hypertrophy. Left ventricular diastolic parameters are indeterminate. Right Ventricle: The right ventricular size is normal. No increase in right ventricular wall thickness. Right ventricular systolic function is normal. Tricuspid regurgitation signal is inadequate for assessing PA pressure. Left Atrium: Left atrial size was mildly dilated. Right Atrium: Right atrial size was mildly dilated. Pericardium: There is no evidence of pericardial effusion. Mitral Valve: The mitral valve is normal in structure. No evidence of mitral valve regurgitation. No evidence of mitral valve stenosis. MV peak gradient, 4.4 mmHg. The mean mitral valve gradient is 2.0 mmHg. Tricuspid Valve: The tricuspid valve is normal in structure. Tricuspid valve regurgitation is not demonstrated. No evidence of tricuspid stenosis. Aortic Valve: The aortic valve is normal in structure. Aortic valve regurgitation is not visualized. Aortic valve sclerosis/calcification is present, without any evidence of aortic stenosis. Aortic valve mean gradient measures 5.7 mmHg. Aortic valve peak  gradient measures 9.8 mmHg. Aortic valve area, by VTI measures 2.78 cm. Pulmonic Valve: The pulmonic valve was normal in structure. Pulmonic valve regurgitation is trivial. No evidence of pulmonic stenosis. Aorta: The aortic  root is normal in size and structure. Venous: The inferior vena cava is normal in size with greater than 50% respiratory variability, suggesting right atrial pressure of 3 mmHg. IAS/Shunts: No atrial level shunt detected by color flow Doppler.  LEFT VENTRICLE PLAX 2D LVIDd:         5.70 cm LVIDs:         4.10 cm LV PW:  1.30 cm LV IVS:        1.30 cm LVOT diam:     2.20 cm LV SV:         81 LV SV Index:   32 LVOT Area:     3.80 cm  RIGHT VENTRICLE RV Basal diam:  4.00 cm RV Mid diam:    3.40 cm RV S prime:     17.50 cm/s LEFT ATRIUM             Index        RIGHT ATRIUM           Index LA diam:        4.10 cm 1.60 cm/m   RA Area:     21.00 cm LA Vol (A2C):   83.4 ml 32.53 ml/m  RA Volume:   67.20 ml  26.21 ml/m LA Vol (A4C):   76.8 ml 29.96 ml/m LA Biplane Vol: 84.9 ml 33.12 ml/m  AORTIC VALVE                     PULMONIC VALVE AV Area (Vmax):    2.97 cm      RVOT Peak grad: 5 mmHg AV Area (Vmean):   2.50 cm AV Area (VTI):     2.78 cm AV Vmax:           156.33 cm/s AV Vmean:          111.667 cm/s AV VTI:            0.291 m AV Peak Grad:      9.8 mmHg AV Mean Grad:      5.7 mmHg LVOT Vmax:         122.00 cm/s LVOT Vmean:        73.300 cm/s LVOT VTI:          0.213 m LVOT/AV VTI ratio: 0.73  AORTA Ao Root diam: 3.60 cm MITRAL VALVE MV Area (PHT): 3.26 cm     SHUNTS MV Area VTI:   4.13 cm     Systemic VTI:  0.21 m MV Peak grad:  4.4 mmHg     Systemic Diam: 2.20 cm MV Mean grad:  2.0 mmHg MV Vmax:       1.05 m/s MV Vmean:      70.4 cm/s MV Decel Time: 233 msec MV E velocity: 107.00 cm/s Lorine Bears MD Electronically signed by Lorine Bears MD Signature Date/Time: 05/20/2023/8:34:37 PM    Final    CT Angio Chest PE W/Cm &/Or Wo Cm  Result Date: 05/20/2023 CLINICAL DATA:  Pulmonary embolism suspected. Positive D-dimer with low to intermediate probability. Presents with shortness of breath and right lower extremity pain. EXAM: CT ANGIOGRAPHY CHEST WITH CONTRAST TECHNIQUE: Multidetector CT imaging  of the chest was performed using the standard protocol during bolus administration of intravenous contrast. Multiplanar CT image reconstructions and MIPs were obtained to evaluate the vascular anatomy. RADIATION DOSE REDUCTION: This exam was performed according to the departmental dose-optimization program which includes automated exposure control, adjustment of the mA and/or kV according to patient size and/or use of iterative reconstruction technique. CONTRAST:  OMNIPAQUE IOHEXOL 350 MG/ML SOLN COMPARISON:  CTA abdomen and pelvis 04/05/2023.  No prior chest CT. FINDINGS: Cardiovascular: The pulmonary arteries are within normal caliber limits. The RV/LV ratio is within normal limits at 0.88. There is nonoccluding right upper lobe apical segmental embolus extending into at least 2 subsegmental branches, best seen on series 4 axial images  53-62. There is additional partially occlusive thrombus seen in the distal interlobar artery with extension into the right middle lobe medial and lateral segmental main arteries. In the right lower lobe additional thrombus is seen in at least 2 segmental divisions to the posterior basal lower lobe and in several downstream subsegmental arteries. The overall clot burden is small without evidence of acute right heart strain. There is no large central embolus or further embolus seen. There is mild cardiomegaly with a left chamber predominance. Scattered calcification in the LAD and right coronary arteries. There is mild aortic atherosclerosis without aneurysm, dissection or stenosis. The great vessels are clear. Pulmonary veins are normal caliber. No pericardial effusion. Mediastinum/Nodes: Small hiatal hernia. Unremarkable thoracic esophagus, thoracic trachea. Clear main bronchi. No intrathoracic or axillary adenopathy or thyroid mass. Lungs/Pleura: There is diffuse bronchial thickening. There is mild posterior atelectasis in the lower lobes. The lungs are clear of infiltrates  and nodules. There is no pleural effusion, thickening or pneumothorax. Chronic linear scarring and cystic change in the anterior base of the right middle lobe. Upper Abdomen: Hepatic steatosis. No acute abnormality. Old cholecystectomy. Small renal cysts. Musculoskeletal: There is degenerative change of the spine slight reverse S shaped thoracic scoliosis. Mild osteopenia. No acute skeletal findings or chest wall mass. Review of the MIP images confirms the above findings. IMPRESSION: 1. Right-sided pulmonary emboli as described above, but overall small clot burden without evidence of acute right heart strain. 2. Mild cardiomegaly with left chamber predominance. 3. Aortic and coronary artery atherosclerosis. 4. Diffuse bronchial thickening consistent with bronchitis or reactive airways disease. 5. Small hiatal hernia. 6. Hepatic steatosis. 7. Critical Value/emergent results were called by telephone at the time of interpretation on 05/20/2023 at 2:37 am to provider JADE SUNG , who verbally acknowledged these results. Aortic Atherosclerosis (ICD10-I70.0). Electronically Signed   By: Almira Bar M.D.   On: 05/20/2023 02:41   US Venous Img Lower Unilateral Right (DVT)  Result Date: 05/20/2023 CLINICAL DATA:  Right lower extremity pain for 2 months EXAM: Right LOWER EXTREMITY VENOUS DOPPLER ULTRASOUND TECHNIQUE: Gray-scale sonography with compression, as well as color and duplex ultrasound, were performed to evaluate the deep venous system(s) from the level of the common femoral vein through the popliteal and proximal calf veins. COMPARISON:  Lower extremity ultrasound 01/08/2017 FINDINGS: VENOUS Normal compressibility of the common femoral, superficial femoral, and popliteal veins. The calf veins are poorly visualized. Visualized portions of profunda femoral vein and great saphenous vein unremarkable. No filling defects to suggest DVT on grayscale or color Doppler imaging. Doppler waveforms show normal direction of  venous flow, normal respiratory plasticity and response to augmentation. Limited views of the contralateral common femoral vein are unremarkable. OTHER None. Limitations: none IMPRESSION: Negative. Electronically Signed   By: Minerva Fester M.D.   On: 05/20/2023 00:02   DG Chest Port 1 View  Result Date: 05/19/2023 CLINICAL DATA:  Shortness of breath EXAM: PORTABLE CHEST 1 VIEW COMPARISON:  Radiographs 06/07/2019 FINDINGS: Stable cardiomegaly. Pulmonary vascular congestion. Right basilar atelectasis or infiltrates. No pleural effusion or pneumothorax. No displaced rib fractures. IMPRESSION: 1. Cardiomegaly with pulmonary vascular congestion. 2. Right basilar atelectasis or infiltrates. Electronically Signed   By: Minerva Fester M.D.   On: 05/19/2023 22:18        Scheduled Meds:  amLODipine  10 mg Oral Daily   apixaban  10 mg Oral BID   Followed by   Melene Muller ON 05/28/2023] apixaban  5 mg Oral BID   cholecalciferol  2,000 Units Oral Daily   cyanocobalamin  1,000 mcg Oral Daily   dapagliflozin propanediol  10 mg Oral Daily   multivitamin with minerals  1 tablet Oral q morning   potassium chloride  40 mEq Oral Once   pravastatin  40 mg Oral QHS   Continuous Infusions:  sodium chloride 50 mL/hr at 05/21/23 0321    ceFAZolin (ANCEF) IV       LOS: 1 day    Tresa Moore, MD [facility]   If 7PM-7AM, please contact night-coverage  05/21/2023, 12:11 PM

## 2023-05-21 NOTE — Progress Notes (Signed)
  Transition of Care [facility]) Screening Note   Patient Details  Name: CHARLSTON KLUZ Date of Birth: December 10, 1945   Transition of Care [facility]) CM/SW Contact:    Chapman Fitch, RN Phone Number: 05/21/2023, 2:20 PM    Transition of Care Department [facility]) has reviewed patient and no TOC needs have been identified at this time. We will continue to monitor patient advancement through interdisciplinary progression rounds. If new patient transition needs arise, please place a TOC consult.  Patient has been weaned to [facility] to provide patient with Eliquis coupon

## 2023-05-21 NOTE — TOC Benefit Eligibility Note (Signed)
Patient Product/process development scientist completed.    The patient is currently admitted and upon discharge could be taking Farxiga 10 mg.  The current 30 day co-pay is $22.00.   The patient is insured through [employer] Medicare Part D   This test claim was processed through [facility]- copay amounts may vary at other pharmacies due to pharmacy/plan contracts, or as the patient moves through the different stages of their insurance plan.  Roland Earl, CPHT Pharmacy Patient Advocate Specialist [facility] Patient Advocate Team Direct Number: [phone]  Fax: [phone]

## 2023-05-21 NOTE — Progress Notes (Signed)
ANTICOAGULATION CONSULT NOTE  Pharmacy Consult for Heparin Infusion Indication: pulmonary embolus  Patient Measurements: Height: 6\' 4"  (193 cm) Weight: 127.9 kg (282 lb) IBW/kg (Calculated) : 86.8 Heparin Dosing Weight: 114.3 kg  Labs: Recent Labs    05/19/23 2159 05/20/23 0148 05/20/23 0328 05/20/23 0600 05/20/23 1100 05/20/23 1854 05/21/23 0449  HGB 13.1  --   --   --   --   --  11.4*  HCT 42.4  --   --   --   --   --  35.3*  PLT 333  --   --   --   --   --  280  APTT  --   --  113*  --   --   --   --   LABPROT  --   --  15.8*  --   --   --   --   INR  --   --  1.2  --   --   --   --   HEPARINUNFRC  --   --   --   --  0.30 0.40 0.37  CREATININE 1.09  --   --   --   --   --   --   TROPONINIHS 20* 37*  --  47*  --   --   --     Estimated Creatinine Clearance: 81.5 mL/min (by C-G formula based on SCr of 1.09 mg/dL).  Medical History: Past Medical History:  Diagnosis Date   Adenomatous polyp of colon    Arthritis    xDJD   Atrial fibrillation (HCC)    BPH (benign prostatic hyperplasia)    CHF (congestive heart failure) (HCC)    COPD (chronic obstructive pulmonary disease) (HCC)    Diabetes mellitus without complication (HCC)    type 2   Discitis    Diverticulitis    Diverticulosis    DVT (deep venous thrombosis) (HCC)    bilateral tibial   Erectile dysfunction    Hearing loss    Hyperlipidemia    Hypertension    Ischemic optic neuropathy    Lower GI bleeding    Lower GI bleeding 02/23/2018   Lumbar degenerative disc disease    Non-ischemic cardiomyopathy (HCC)    Sleep apnea    Assessment: Pt is a 78 yo male presenting to ED c/o SOB & R leg pain/numbness found w/  "nonoccluding right upper lobe apical segmental embolus extending into at least 2 subsegmental branches."  0520 1100 HL 0.30, therapeutic x 1; 1900 un/hr 0520 1854 HL 0.40, therapeutic x 2, 1900 un/hr 0521 0449 HL 0.37, therapeutic X 3, 1900 units/hr  Goal of Therapy:  Heparin level  0.3-0.7 units/ml Monitor platelets by anticoagulation protocol: Yes   Plan:  --Heparin level is therapeutic x 3 and well within therapeutic range --Will continue heparin infusion at current rate of 1900 units/hr --Transition to daily levels. Next HL tomorrow AM --Daily CBC per protocol while on IV heparin  Ladean Steinmeyer D 05/21/2023 5:46 AM

## 2023-05-21 NOTE — Telephone Encounter (Signed)
PA for Eligard submitted to [employer], awaiting response.

## 2023-05-21 NOTE — Consult Note (Signed)
Pharmacy Antibiotic Note  Evan [name] is a 78 y.o. male admitted on 05/20/2023 with  acute PE and RLE cellulitis . Pharmacy has been consulted for vancomycin dosing.  Assessment: 78 yo M with PMH CHF, COPD, DM, OSA, BPH presenting with worsening dyspnea associated with RLE pain. Pt with scattered pulmonary emboli and also evidence of RLE cellulitis. Doppler US negative for VTE. Patient was loaded with vancomycin 2 g on 5/20 @ 0531 and received one dose of IV levofloxacin. Renal function back at baseline of 0.8 so will initiate maintenance regimen.  Vancomycin 1250 mg IV q12H Goal AUC 400-550  Est AUC: 477; Cmax: 31; Cmin: 13 SCr 0.8; IBW; Vd 0.5  Plan: Day #2 of antibiotics Initiate vancomycin 1250 mg IV q12H Follow up culture results to assess for antibiotic optimization Monitor renal function to assess for any necessary antibiotic dosing changes  Height: 6\' 4"  (193 cm) Weight: 127.9 kg (282 lb) IBW/kg (Calculated) : 86.8  Temp (24hrs), Avg:99.1 F (37.3 C), Min:98.9 F (37.2 C), Max:99.2 F (37.3 C)  Recent Labs  Lab 05/19/23 2159 05/20/23 0328 05/20/23 0530 05/21/23 0449  WBC 14.0*  --   --  14.1*  CREATININE 1.09  --   --  0.80  LATICACIDVEN  --  1.7 1.7  --      Estimated Creatinine Clearance: 111.1 mL/min (by C-G formula based on SCr of 0.8 mg/dL).    Allergies  Allergen Reactions   Azithromycin Rash   Penicillins Rash    Has patient had a PCN reaction causing immediate rash, facial/tongue/throat swelling, SOB or lightheadedness with hypotension: Yes Has patient had a PCN reaction causing severe rash involving mucus membranes or skin necrosis: Yes Has patient had a PCN reaction that required hospitalization No Has patient had a PCN reaction occurring within the last 10 years: No If all of the above answers are "NO", then may proceed with Cephalosporin use.      Lisinopril Cough   Losartan Cough   Metoprolol Other (See Comments)    Bradycardia.     Antimicrobials this admission: Levofloxacin 5/20 x 1 Vancomycin 5/20 >>   Dose adjustments this admission: N/A  Microbiology results: 5/20 BCx: NG1D  Thank you for allowing pharmacy to be a part of this patient's care.  Will M. Dareen Piano, PharmD PGY-1 Pharmacy Resident 05/21/2023 7:37 AM

## 2023-05-21 NOTE — Telephone Encounter (Signed)
Pharmacy Patient Advocate Encounter  Insurance verification completed.    The patient is insured through [employer] Part D   The patient is currently admitted and ran test claims for the following: Farxiga.  Copays and coinsurance results were relayed to Inpatient clinical team.

## 2023-05-22 DIAGNOSIS — I2699 Other pulmonary embolism without acute cor pulmonale: Secondary | ICD-10-CM | POA: Diagnosis not present

## 2023-05-22 LAB — CBC
HCT: 36.5 % — ABNORMAL LOW (ref 39.0–52.0)
Hemoglobin: 11.6 g/dL — ABNORMAL LOW (ref 13.0–17.0)
MCH: 25 pg — ABNORMAL LOW (ref 26.0–34.0)
MCHC: 31.8 g/dL (ref 30.0–36.0)
MCV: 78.7 fL — ABNORMAL LOW (ref 80.0–100.0)
Platelets: 272 10*3/uL (ref 150–400)
RBC: 4.64 MIL/uL (ref 4.22–5.81)
RDW: 14.6 % (ref 11.5–15.5)
WBC: 14.9 10*3/uL — ABNORMAL HIGH (ref 4.0–10.5)
nRBC: 0 % (ref 0.0–0.2)

## 2023-05-22 LAB — CREATININE, SERUM
Creatinine, Ser: 0.88 mg/dL (ref 0.61–1.24)
GFR, Estimated: >60 mL/min (ref >60)

## 2023-05-22 MED ORDER — POLYETHYLENE GLYCOL 3350 17 G PO PACK
17.0000 g | PACK | Freq: Every day | ORAL | Status: DC
  Administered 2023-05-22: 17 g via ORAL
  Filled 2023-05-22: qty 1

## 2023-05-22 NOTE — Progress Notes (Signed)
PROGRESS NOTE    Evan [name]  WGN:[id number] DOB: 03-08-1945 DOA: 05/20/2023 PCP: Mick Sell, MD    Brief Narrative:  78 year old male history significant for CHF, COPD, diabetes, OSA, BPH who presents to the ED with acute onset of worsening dyspnea x 1 week associated with right lower extremity pain.  Patient has scattered pulmonary emboli with no CT evidence of right heart strain.  Also has a clinical evidence of a right lower extremity cellulitis.  Interestingly right lower extremity ultrasound negative for VTE.  Started on intravenous antibiotics for cellulitis and heparin GTT for pulmonary embolism.   2D echocardiogram reassuring.  No evidence of right heart strain.   Assessment & Plan:   Principal Problem:   Acute pulmonary embolism (HCC) Active Problems:   History of GI diverticular bleed   Sleep apnea   History of deep vein thrombosis (DVT) of lower extremity   Severe obesity (BMI 35.0-39.9) with comorbidity (HCC)   Cellulitis of right lower extremity   Essential hypertension   Type 2 diabetes mellitus without complications (HCC)   Dyslipidemia   Pulmonary embolism (HCC)  * Acute pulmonary embolism (HCC) Clinically improved.  Shortness of breath resolved.  Weaned off supplemental oxygen.  Echocardiogram reassuring.  No evidence of right heart strain. Plan: Continue eliquis  Cellulitis of right lower extremity Nonpurulent.  Unknown trigger.  Right lower extremity negative for DVT - spoke w/ radiology today who confirms this. Stable but no sig improvement last 24 Plan: Continue ancef Ted hose   Dyslipidemia PTA statin   Type 2 diabetes mellitus without complications (HCC) Home metformin on hold.  Sliding close coverage and carb modified diet while admitted   Essential hypertension PTA blood pressure regimen  DVT prophylaxis: Eliquis Code Status: Full Family Communication: Spouse at bedside 5/21 Disposition Plan: Status is: Inpatient Remains  inpatient appropriate because: Acute PE.  Right lower extremity cellulitis   Level of care: Telemetry Medical  Consultants:  None  Procedures:  None  Antimicrobials: Ancef   Subjective: Seen and examined.  Resting comfortably in bed.  Reports some pain in her right lower extremity but improving over interval.  Shortness of breath resolved.  Objective: Vitals:   05/21/23 0629 05/21/23 0825 05/21/23 1825 05/22/23 0816  BP: 132/68 (!) 127/58 125/72 129/72  Pulse: 93  90 85  Resp: 18 20 18 18   Temp:  99.4 F (37.4 C) 98.8 F (37.1 C) 98.3 F (36.8 C)  TempSrc:  Oral    SpO2: 100% 100% 100% 97%  Weight:      Height:        Intake/Output Summary (Last 24 hours) at 05/22/2023 1106 Last data filed at 05/22/2023 1055 Gross per 24 hour  Intake 480 ml  Output 1800 ml  Net -1320 ml   Filed Weights   05/19/23 2142  Weight: 127.9 kg    Examination:  General exam: Appears calm and comfortable  Respiratory system: Clear to auscultation. Respiratory effort normal. Cardiovascular system: S1-S2, RRR, no murmurs, no pedal edema Gastrointestinal system: Soft, NT/ND, normal bowel sounds Central nervous system: Alert and oriented. No focal neurological deficits. Extremities: Symmetric 5 x 5 power. Skin: Right lower extremity erythematous, tender to touch, warm  Psychiatry: Judgement and insight appear normal. Mood & affect appropriate.     Data Reviewed: I have personally reviewed following labs and imaging studies  CBC: Recent Labs  Lab 05/19/23 2159 05/21/23 0449 05/22/23 0502  WBC 14.0* 14.1* 14.9*  HGB 13.1 11.4* 11.6*  HCT 42.4  35.3* 36.5*  MCV 80.9 77.9* 78.7*  PLT 333 280 272   Basic Metabolic Panel: Recent Labs  Lab 05/19/23 2159 05/21/23 0449 05/22/23 0502  NA 137 134*  --   K 4.2 3.6  --   CL 101 102  --   CO2 25 25  --   GLUCOSE 181* 145*  --   BUN 15 10  --   CREATININE 1.09 0.80 0.88  CALCIUM 9.0 8.0*  --    GFR: Estimated Creatinine  Clearance: 101 mL/min (by C-G formula based on SCr of 0.88 mg/dL). Liver Function Tests: No results for input(s): "AST", "ALT", "ALKPHOS", "BILITOT", "PROT", "ALBUMIN" in the last 168 hours. No results for input(s): "LIPASE", "AMYLASE" in the last 168 hours. No results for input(s): "AMMONIA" in the last 168 hours. Coagulation Profile: Recent Labs  Lab 05/20/23 0328  INR 1.2   Cardiac Enzymes: No results for input(s): "CKTOTAL", "CKMB", "CKMBINDEX", "TROPONINI" in the last 168 hours. BNP (last 3 results) No results for input(s): "PROBNP" in the last 8760 hours. HbA1C: No results for input(s): "HGBA1C" in the last 72 hours. CBG: Recent Labs  Lab 05/21/23 0803  GLUCAP 114*   Lipid Profile: No results for input(s): "CHOL", "HDL", "LDLCALC", "TRIG", "CHOLHDL", "LDLDIRECT" in the last 72 hours. Thyroid Function Tests: No results for input(s): "TSH", "T4TOTAL", "FREET4", "T3FREE", "THYROIDAB" in the last 72 hours. Anemia Panel: No results for input(s): "VITAMINB12", "FOLATE", "FERRITIN", "TIBC", "IRON", "RETICCTPCT" in the last 72 hours. Sepsis Labs: Recent Labs  Lab 05/20/23 0328 05/20/23 0530  LATICACIDVEN 1.7 1.7    Recent Results (from the past 240 hour(s))  Culture, blood (routine x 2)     Status: None (Preliminary result)   Collection Time: 05/20/23  3:28 AM   Specimen: BLOOD RIGHT HAND  Result Value Ref Range Status   Specimen Description BLOOD RIGHT HAND  Final   Special Requests   Final    BOTTLES DRAWN AEROBIC AND ANAEROBIC Blood Culture adequate volume   Culture   Final    NO GROWTH 2 DAYS Performed at [facility], [address], [place], [place] [address]    Report Status PENDING  Incomplete  Culture, blood (routine x 2)     Status: None (Preliminary result)   Collection Time: 05/20/23  3:28 AM   Specimen: BLOOD LEFT ARM  Result Value Ref Range Status   Specimen Description BLOOD LEFT ARM  Final   Special Requests   Final    BOTTLES DRAWN  AEROBIC AND ANAEROBIC Blood Culture adequate volume   Culture   Final    NO GROWTH 2 DAYS Performed at [facility], [address], [place], [place] [address]    Report Status PENDING  Incomplete         Radiology Studies: ECHOCARDIOGRAM COMPLETE  Result Date: 05/20/2023    ECHOCARDIOGRAM REPORT   Patient Name:   Evan [name] Date of Exam: 05/20/2023 Medical Rec #:  [id number]        Height:       76.0 in Accession #:    [id number]       Weight:       282.0 lb Date of Birth:  [birth year]        BSA:          2.564 m Patient Age:    77 years         BP:           145/70 mmHg Patient Gender: M  HR:           84 bpm. Exam Location:  [facility] Procedure: 2D Echo, Cardiac Doppler and Color Doppler Indications:     Pulmonary embolus  History:         Patient has prior history of Echocardiogram examinations, most                  recent 02/24/2018. CHF, COPD, Signs/Symptoms:Syncope; Risk                  Factors:Hypertension, Diabetes and Dyslipidemia. Pulmonary                  embolus, history of ablation.  Sonographer:     Mikki Harbor Referring Phys:  [id number] JAN A MANSY Diagnosing Phys: Lorine Bears MD  Sonographer Comments: Technically difficult study due to poor echo windows and patient is obese. IMPRESSIONS  1. Left ventricular ejection fraction, by estimation, is 50 to 55%. The left ventricle has low normal function. Left ventricular endocardial border not optimally defined to evaluate regional wall motion. The left ventricular internal cavity size was mildly dilated. There is mild left ventricular hypertrophy. Left ventricular diastolic parameters are indeterminate.  2. Right ventricular systolic function is normal. The right ventricular size is normal. Tricuspid regurgitation signal is inadequate for assessing PA pressure.  3. Left atrial size was mildly dilated.  4. Right atrial size was mildly dilated.  5. The mitral valve is normal in structure. No evidence of mitral  valve regurgitation. No evidence of mitral stenosis.  6. The aortic valve is normal in structure. Aortic valve regurgitation is not visualized. Aortic valve sclerosis/calcification is present, without any evidence of aortic stenosis.  7. The inferior vena cava is normal in size with greater than 50% respiratory variability, suggesting right atrial pressure of 3 mmHg. FINDINGS  Left Ventricle: Left ventricular ejection fraction, by estimation, is 50 to 55%. The left ventricle has low normal function. Left ventricular endocardial border not optimally defined to evaluate regional wall motion. The left ventricular internal cavity  size was mildly dilated. There is mild left ventricular hypertrophy. Left ventricular diastolic parameters are indeterminate. Right Ventricle: The right ventricular size is normal. No increase in right ventricular wall thickness. Right ventricular systolic function is normal. Tricuspid regurgitation signal is inadequate for assessing PA pressure. Left Atrium: Left atrial size was mildly dilated. Right Atrium: Right atrial size was mildly dilated. Pericardium: There is no evidence of pericardial effusion. Mitral Valve: The mitral valve is normal in structure. No evidence of mitral valve regurgitation. No evidence of mitral valve stenosis. MV peak gradient, 4.4 mmHg. The mean mitral valve gradient is 2.0 mmHg. Tricuspid Valve: The tricuspid valve is normal in structure. Tricuspid valve regurgitation is not demonstrated. No evidence of tricuspid stenosis. Aortic Valve: The aortic valve is normal in structure. Aortic valve regurgitation is not visualized. Aortic valve sclerosis/calcification is present, without any evidence of aortic stenosis. Aortic valve mean gradient measures 5.7 mmHg. Aortic valve peak  gradient measures 9.8 mmHg. Aortic valve area, by VTI measures 2.78 cm. Pulmonic Valve: The pulmonic valve was normal in structure. Pulmonic valve regurgitation is trivial. No evidence of  pulmonic stenosis. Aorta: The aortic root is normal in size and structure. Venous: The inferior vena cava is normal in size with greater than 50% respiratory variability, suggesting right atrial pressure of 3 mmHg. IAS/Shunts: No atrial level shunt detected by color flow Doppler.  LEFT VENTRICLE PLAX 2D LVIDd:  5.70 cm LVIDs:         4.10 cm LV PW:         1.30 cm LV IVS:        1.30 cm LVOT diam:     2.20 cm LV SV:         81 LV SV Index:   32 LVOT Area:     3.80 cm  RIGHT VENTRICLE RV Basal diam:  4.00 cm RV Mid diam:    3.40 cm RV S prime:     17.50 cm/s LEFT ATRIUM             Index        RIGHT ATRIUM           Index LA diam:        4.10 cm 1.60 cm/m   RA Area:     21.00 cm LA Vol (A2C):   83.4 ml 32.53 ml/m  RA Volume:   67.20 ml  26.21 ml/m LA Vol (A4C):   76.8 ml 29.96 ml/m LA Biplane Vol: 84.9 ml 33.12 ml/m  AORTIC VALVE                     PULMONIC VALVE AV Area (Vmax):    2.97 cm      RVOT Peak grad: 5 mmHg AV Area (Vmean):   2.50 cm AV Area (VTI):     2.78 cm AV Vmax:           156.33 cm/s AV Vmean:          111.667 cm/s AV VTI:            0.291 m AV Peak Grad:      9.8 mmHg AV Mean Grad:      5.7 mmHg LVOT Vmax:         122.00 cm/s LVOT Vmean:        73.300 cm/s LVOT VTI:          0.213 m LVOT/AV VTI ratio: 0.73  AORTA Ao Root diam: 3.60 cm MITRAL VALVE MV Area (PHT): 3.26 cm     SHUNTS MV Area VTI:   4.13 cm     Systemic VTI:  0.21 m MV Peak grad:  4.4 mmHg     Systemic Diam: 2.20 cm MV Mean grad:  2.0 mmHg MV Vmax:       1.05 m/s MV Vmean:      70.4 cm/s MV Decel Time: 233 msec MV E velocity: 107.00 cm/s Lorine Bears MD Electronically signed by Lorine Bears MD Signature Date/Time: 05/20/2023/8:34:37 PM    Final         Scheduled Meds:  amLODipine  10 mg Oral Daily   apixaban  10 mg Oral BID   Followed by   Melene Muller ON 05/28/2023] apixaban  5 mg Oral BID   cholecalciferol  2,000 Units Oral Daily   cyanocobalamin  1,000 mcg Oral Daily   dapagliflozin propanediol  10 mg  Oral Daily   multivitamin with minerals  1 tablet Oral q morning   pravastatin  40 mg Oral QHS   Continuous Infusions:   ceFAZolin (ANCEF) IV 2 g (05/22/23 9562)     LOS: 2 days    Silvano Bilis, MD [facility]   If 7PM-7AM, please contact night-coverage  05/22/2023, 11:06 AM

## 2023-05-22 NOTE — Plan of Care (Signed)

## 2023-05-23 ENCOUNTER — Telehealth: Payer: Self-pay

## 2023-05-23 ENCOUNTER — Other Ambulatory Visit (HOSPITAL_COMMUNITY): Payer: Self-pay

## 2023-05-23 DIAGNOSIS — I2699 Other pulmonary embolism without acute cor pulmonale: Secondary | ICD-10-CM | POA: Diagnosis not present

## 2023-05-23 LAB — CBC
HCT: 37.9 % — ABNORMAL LOW (ref 39.0–52.0)
Hemoglobin: 12.2 g/dL — ABNORMAL LOW (ref 13.0–17.0)
MCH: 24.9 pg — ABNORMAL LOW (ref 26.0–34.0)
MCHC: 32.2 g/dL (ref 30.0–36.0)
MCV: 77.3 fL — ABNORMAL LOW (ref 80.0–100.0)
Platelets: 310 10*3/uL (ref 150–400)
RBC: 4.9 MIL/uL (ref 4.22–5.81)
RDW: 14.8 % (ref 11.5–15.5)
WBC: 12.5 10*3/uL — ABNORMAL HIGH (ref 4.0–10.5)
nRBC: 0.2 % (ref 0.0–0.2)

## 2023-05-23 LAB — CULTURE, BLOOD (ROUTINE X 2)
Culture: NO GROWTH
Special Requests: ADEQUATE

## 2023-05-23 MED ORDER — SULFAMETHOXAZOLE-TRIMETHOPRIM 800-160 MG PO TABS: 2.0000 | ORAL_TABLET | Freq: Two times a day (BID) | ORAL | 0 refills | Status: AC

## 2023-05-23 MED ORDER — CEPHALEXIN 500 MG PO CAPS: 500.0000 mg | ORAL_CAPSULE | Freq: Four times a day (QID) | ORAL | 0 refills | Status: AC

## 2023-05-23 MED ORDER — APIXABAN 5 MG PO TABS: 10.0000 mg | ORAL_TABLET | Freq: Two times a day (BID) | ORAL | 0 refills | Status: DC

## 2023-05-23 MED ORDER — APIXABAN 5 MG PO TABS: 5.0000 mg | ORAL_TABLET | Freq: Two times a day (BID) | ORAL | 1 refills | Status: DC

## 2023-05-23 MED ORDER — INSULIN ASPART 100 UNIT/ML IJ SOLN: 0.0000 [IU] | Freq: Three times a day (TID) | INTRAMUSCULAR | Status: DC

## 2023-05-23 NOTE — Care Management Important Message (Signed)
Important Message  Patient Details  Name: Evan [name] MRN: [id number] Date of Birth: 06-05-1945   Medicare Important Message Given:  Yes     Johnell Comings 05/23/2023, 12:39 PM

## 2023-05-23 NOTE — Plan of Care (Signed)

## 2023-05-23 NOTE — Telephone Encounter (Signed)
Incoming approval of [employer] PA  Auth# [id number]  Dates: 05/21/23 - 05/20/24

## 2023-05-23 NOTE — Discharge Summary (Addendum)
Evan [name]:[id number] DOB: Feb 23, 1945 DOA: 05/20/2023  PCP: Mick Sell, MD  Admit date: 05/20/2023 Discharge date: 05/23/2023  Time spent: 35 minutes  Recommendations for Outpatient Follow-up:  Pcp f/u 1 week, check on cellulitis healing  Will need to ensure utd with cancer screenings    Discharge Diagnoses:  Principal Problem:   Acute pulmonary embolism (HCC) Active Problems:   History of GI diverticular bleed   Sleep apnea   History of deep vein thrombosis (DVT) of lower extremity   Severe obesity (BMI 35.0-39.9) with comorbidity (HCC)   Cellulitis of right lower extremity   Essential hypertension   Type 2 diabetes mellitus without complications (HCC)   Dyslipidemia   Pulmonary embolism (HCC)   Discharge Condition: stable  Diet recommendation: heart healthy  Filed Weights   05/19/23 2142  Weight: 127.9 kg    History of present illness:  From admission h and p SHANG ADMIRE is a 78 y.o. male with medical history significant for BPH, CHF, COPD, type diabetes mellitus, OSA, DVT, hypertension and dyslipidemia, who presented to the ER with acute onset of worsening dyspnea for about a week and right lower extremity pain.  Reported chronic right leg pain with numbness for about a year.  He had a DVT in the same leg in the calf.  It feels more swollen, red and hot.  No fever or chills.  No chest pain or palpitations.  No nausea or vomiting or abdominal pain.  No melena or bright red bleeding per rectum.  He has been having mild urinary frequency with no dysuria, oliguria or hematuria or flank pain.  No other bleeding diathesis.  He admitted to light headedness without dizziness or blurred vision or paresthesias or focal muscle weakness.   Hospital Course:    Acute pulmonary embolism (HCC) Clinically improved.  Shortness of breath resolved.  Weaned off supplemental oxygen.  Echocardiogram reassuring -no o evidence of right heart strain. Appears to be  unprovoked Plan: Continue eliquis   Cellulitis of right lower extremity Nonpurulent.  Unknown trigger.  Right lower extremity negative for DVT - spoke w/ radiology who confirms this. Still with erythema and swelling but this is improving, pain resolved. Treated here with vanc>cefazolin, will send home w/ 5 day course bactrim/keflex   Dyslipidemia PTA statin   Type 2 diabetes mellitus without complications (HCC) Glucose appropriate   Essential hypertension Controlled on home meds  Procedures: none   Consultations: none  Discharge Exam: Vitals:   05/23/23 0532 05/23/23 0919  BP: 118/76 130/85  Pulse: 99 96  Resp: 18 (!) 22  Temp: 98.4 F (36.9 C) 98.2 F (36.8 C)  SpO2: 93% 96%    General exam: Appears calm and comfortable  Respiratory system: Clear to auscultation. Respiratory effort normal. Cardiovascular system: S1-S2, RRR, no murmurs, no pedal edema Gastrointestinal system: Soft, NT/ND, normal bowel sounds Central nervous system: Alert and oriented. No focal neurological deficits. Extremities: Symmetric 5 x 5 power. Skin: Right lower extremity erythematous, indurated, warm. Improved from yesterday Psychiatry: Judgement and insight appear normal. Mood & affect appropriate.   Discharge Instructions   Discharge Instructions     Diet - low sodium heart healthy   Complete by: As directed    Increase activity slowly   Complete by: As directed       Allergies as of 05/23/2023       Reactions   Azithromycin Rash   Penicillins Rash   Has patient had a PCN reaction causing immediate rash,  facial/tongue/throat swelling, SOB or lightheadedness with hypotension: Yes Has patient had a PCN reaction causing severe rash involving mucus membranes or skin necrosis: Yes Has patient had a PCN reaction that required hospitalization No Has patient had a PCN reaction occurring within the last 10 years: No If all of the above answers are "NO", then may proceed with  Cephalosporin use.   Lisinopril Cough   Losartan Cough   Metoprolol Other (See Comments)   Bradycardia.        Medication List     STOP taking these medications    aspirin EC 81 MG tablet       TAKE these medications    acetaminophen 500 MG tablet Commonly known as: TYLENOL Take 1,000 mg by mouth every 8 (eight) hours as needed.   albuterol 108 (90 Base) MCG/ACT inhaler Commonly known as: VENTOLIN HFA SMARTSIG:2 inhalation Via Inhaler Every 6 Hours PRN   amLODipine 10 MG tablet Commonly known as: NORVASC Take 10 mg by mouth daily.   apixaban 5 MG Tabs tablet Commonly known as: ELIQUIS Take 2 tablets (10 mg total) by mouth 2 (two) times daily for 9 doses.   apixaban 5 MG Tabs tablet Commonly known as: ELIQUIS Take 1 tablet (5 mg total) by mouth 2 (two) times daily. Start taking on: May 28, 2023   CENTRUM SILVER PO Take 1 tablet by mouth every morning.   cephALEXin 500 MG capsule Commonly known as: KEFLEX Take 1 capsule (500 mg total) by mouth 4 (four) times daily for 5 days.   cyanocobalamin 1000 MCG tablet Commonly known as: VITAMIN B12 Take 1,000 mcg by mouth daily.   hydrOXYzine 25 MG tablet Commonly known as: ATARAX Take 25 mg by mouth at bedtime as needed (sleep).   metFORMIN 500 MG 24 hr tablet Commonly known as: GLUCOPHAGE-XR 1,000 mg daily with breakfast.   polyethylene glycol 17 g packet Commonly known as: MIRALAX / GLYCOLAX Take 17 g by mouth daily. What changed:  when to take this reasons to take this   pravastatin 40 MG tablet Commonly known as: PRAVACHOL Take 40 mg by mouth at bedtime.   sulfamethoxazole-trimethoprim 800-160 MG tablet Commonly known as: BACTRIM DS Take 2 tablets by mouth 2 (two) times daily for 5 days.   Vitamin D3 50 MCG (2000 UT) capsule Take 2,000 Units by mouth daily.       Allergies  Allergen Reactions   Azithromycin Rash   Penicillins Rash    Has patient had a PCN reaction causing immediate rash,  facial/tongue/throat swelling, SOB or lightheadedness with hypotension: Yes Has patient had a PCN reaction causing severe rash involving mucus membranes or skin necrosis: Yes Has patient had a PCN reaction that required hospitalization No Has patient had a PCN reaction occurring within the last 10 years: No If all of the above answers are "NO", then may proceed with Cephalosporin use.      Lisinopril Cough   Losartan Cough   Metoprolol Other (See Comments)    Bradycardia.    Follow-up Information     Mick Sell, MD Follow up.   Specialty: Infectious Diseases Contact information: [address] [place] [address] [phone]                  The results of significant diagnostics from this hospitalization (including imaging, microbiology, ancillary and laboratory) are listed below for reference.    Significant Diagnostic Studies: ECHOCARDIOGRAM COMPLETE  Result Date: 05/20/2023    ECHOCARDIOGRAM REPORT  Patient Name:   CARRICK WINKELBAUER Date of Exam: 05/20/2023 Medical Rec #:  [id number]        Height:       76.0 in Accession #:    [id number]       Weight:       282.0 lb Date of Birth:  [birth year]        BSA:          2.564 m Patient Age:    71 years         BP:           145/70 mmHg Patient Gender: M                HR:           84 bpm. Exam Location:  [facility] Procedure: 2D Echo, Cardiac Doppler and Color Doppler Indications:     Pulmonary embolus  History:         Patient has prior history of Echocardiogram examinations, most                  recent 02/24/2018. CHF, COPD, Signs/Symptoms:Syncope; Risk                  Factors:Hypertension, Diabetes and Dyslipidemia. Pulmonary                  embolus, history of ablation.  Sonographer:     Mikki Harbor Referring Phys:  [id number] JAN A MANSY Diagnosing Phys: Lorine Bears MD  Sonographer Comments: Technically difficult study due to poor echo windows and patient is obese. IMPRESSIONS  1. Left ventricular ejection  fraction, by estimation, is 50 to 55%. The left ventricle has low normal function. Left ventricular endocardial border not optimally defined to evaluate regional wall motion. The left ventricular internal cavity size was mildly dilated. There is mild left ventricular hypertrophy. Left ventricular diastolic parameters are indeterminate.  2. Right ventricular systolic function is normal. The right ventricular size is normal. Tricuspid regurgitation signal is inadequate for assessing PA pressure.  3. Left atrial size was mildly dilated.  4. Right atrial size was mildly dilated.  5. The mitral valve is normal in structure. No evidence of mitral valve regurgitation. No evidence of mitral stenosis.  6. The aortic valve is normal in structure. Aortic valve regurgitation is not visualized. Aortic valve sclerosis/calcification is present, without any evidence of aortic stenosis.  7. The inferior vena cava is normal in size with greater than 50% respiratory variability, suggesting right atrial pressure of 3 mmHg. FINDINGS  Left Ventricle: Left ventricular ejection fraction, by estimation, is 50 to 55%. The left ventricle has low normal function. Left ventricular endocardial border not optimally defined to evaluate regional wall motion. The left ventricular internal cavity  size was mildly dilated. There is mild left ventricular hypertrophy. Left ventricular diastolic parameters are indeterminate. Right Ventricle: The right ventricular size is normal. No increase in right ventricular wall thickness. Right ventricular systolic function is normal. Tricuspid regurgitation signal is inadequate for assessing PA pressure. Left Atrium: Left atrial size was mildly dilated. Right Atrium: Right atrial size was mildly dilated. Pericardium: There is no evidence of pericardial effusion. Mitral Valve: The mitral valve is normal in structure. No evidence of mitral valve regurgitation. No evidence of mitral valve stenosis. MV peak gradient,  4.4 mmHg. The mean mitral valve gradient is 2.0 mmHg. Tricuspid Valve: The tricuspid valve is normal in structure. Tricuspid valve regurgitation is not demonstrated. No evidence of tricuspid stenosis.  Aortic Valve: The aortic valve is normal in structure. Aortic valve regurgitation is not visualized. Aortic valve sclerosis/calcification is present, without any evidence of aortic stenosis. Aortic valve mean gradient measures 5.7 mmHg. Aortic valve peak  gradient measures 9.8 mmHg. Aortic valve area, by VTI measures 2.78 cm. Pulmonic Valve: The pulmonic valve was normal in structure. Pulmonic valve regurgitation is trivial. No evidence of pulmonic stenosis. Aorta: The aortic root is normal in size and structure. Venous: The inferior vena cava is normal in size with greater than 50% respiratory variability, suggesting right atrial pressure of 3 mmHg. IAS/Shunts: No atrial level shunt detected by color flow Doppler.  LEFT VENTRICLE PLAX 2D LVIDd:         5.70 cm LVIDs:         4.10 cm LV PW:         1.30 cm LV IVS:        1.30 cm LVOT diam:     2.20 cm LV SV:         81 LV SV Index:   32 LVOT Area:     3.80 cm  RIGHT VENTRICLE RV Basal diam:  4.00 cm RV Mid diam:    3.40 cm RV S prime:     17.50 cm/s LEFT ATRIUM             Index        RIGHT ATRIUM           Index LA diam:        4.10 cm 1.60 cm/m   RA Area:     21.00 cm LA Vol (A2C):   83.4 ml 32.53 ml/m  RA Volume:   67.20 ml  26.21 ml/m LA Vol (A4C):   76.8 ml 29.96 ml/m LA Biplane Vol: 84.9 ml 33.12 ml/m  AORTIC VALVE                     PULMONIC VALVE AV Area (Vmax):    2.97 cm      RVOT Peak grad: 5 mmHg AV Area (Vmean):   2.50 cm AV Area (VTI):     2.78 cm AV Vmax:           156.33 cm/s AV Vmean:          111.667 cm/s AV VTI:            0.291 m AV Peak Grad:      9.8 mmHg AV Mean Grad:      5.7 mmHg LVOT Vmax:         122.00 cm/s LVOT Vmean:        73.300 cm/s LVOT VTI:          0.213 m LVOT/AV VTI ratio: 0.73  AORTA Ao Root diam: 3.60 cm MITRAL VALVE  MV Area (PHT): 3.26 cm     SHUNTS MV Area VTI:   4.13 cm     Systemic VTI:  0.21 m MV Peak grad:  4.4 mmHg     Systemic Diam: 2.20 cm MV Mean grad:  2.0 mmHg MV Vmax:       1.05 m/s MV Vmean:      70.4 cm/s MV Decel Time: 233 msec MV E velocity: 107.00 cm/s Lorine Bears MD Electronically signed by Lorine Bears MD Signature Date/Time: 05/20/2023/8:34:37 PM    Final    CT Angio Chest PE W/Cm &/Or Wo Cm  Result Date: 05/20/2023 CLINICAL DATA:  Pulmonary embolism suspected. Positive D-dimer with low  to intermediate probability. Presents with shortness of breath and right lower extremity pain. EXAM: CT ANGIOGRAPHY CHEST WITH CONTRAST TECHNIQUE: Multidetector CT imaging of the chest was performed using the standard protocol during bolus administration of intravenous contrast. Multiplanar CT image reconstructions and MIPs were obtained to evaluate the vascular anatomy. RADIATION DOSE REDUCTION: This exam was performed according to the departmental dose-optimization program which includes automated exposure control, adjustment of the mA and/or kV according to patient size and/or use of iterative reconstruction technique. CONTRAST:  OMNIPAQUE IOHEXOL 350 MG/ML SOLN COMPARISON:  CTA abdomen and pelvis 04/05/2023.  No prior chest CT. FINDINGS: Cardiovascular: The pulmonary arteries are within normal caliber limits. The RV/LV ratio is within normal limits at 0.88. There is nonoccluding right upper lobe apical segmental embolus extending into at least 2 subsegmental branches, best seen on series 4 axial images 53-62. There is additional partially occlusive thrombus seen in the distal interlobar artery with extension into the right middle lobe medial and lateral segmental main arteries. In the right lower lobe additional thrombus is seen in at least 2 segmental divisions to the posterior basal lower lobe and in several downstream subsegmental arteries. The overall clot burden is small without evidence of acute  right heart strain. There is no large central embolus or further embolus seen. There is mild cardiomegaly with a left chamber predominance. Scattered calcification in the LAD and right coronary arteries. There is mild aortic atherosclerosis without aneurysm, dissection or stenosis. The great vessels are clear. Pulmonary veins are normal caliber. No pericardial effusion. Mediastinum/Nodes: Small hiatal hernia. Unremarkable thoracic esophagus, thoracic trachea. Clear main bronchi. No intrathoracic or axillary adenopathy or thyroid mass. Lungs/Pleura: There is diffuse bronchial thickening. There is mild posterior atelectasis in the lower lobes. The lungs are clear of infiltrates and nodules. There is no pleural effusion, thickening or pneumothorax. Chronic linear scarring and cystic change in the anterior base of the right middle lobe. Upper Abdomen: Hepatic steatosis. No acute abnormality. Old cholecystectomy. Small renal cysts. Musculoskeletal: There is degenerative change of the spine slight reverse S shaped thoracic scoliosis. Mild osteopenia. No acute skeletal findings or chest wall mass. Review of the MIP images confirms the above findings. IMPRESSION: 1. Right-sided pulmonary emboli as described above, but overall small clot burden without evidence of acute right heart strain. 2. Mild cardiomegaly with left chamber predominance. 3. Aortic and coronary artery atherosclerosis. 4. Diffuse bronchial thickening consistent with bronchitis or reactive airways disease. 5. Small hiatal hernia. 6. Hepatic steatosis. 7. Critical Value/emergent results were called by telephone at the time of interpretation on 05/20/2023 at 2:37 am to provider JADE SUNG , who verbally acknowledged these results. Aortic Atherosclerosis (ICD10-I70.0). Electronically Signed   By: Almira Bar M.D.   On: 05/20/2023 02:41   US Venous Img Lower Unilateral Right (DVT)  Result Date: 05/20/2023 CLINICAL DATA:  Right lower extremity pain for 2  months EXAM: Right LOWER EXTREMITY VENOUS DOPPLER ULTRASOUND TECHNIQUE: Gray-scale sonography with compression, as well as color and duplex ultrasound, were performed to evaluate the deep venous system(s) from the level of the common femoral vein through the popliteal and proximal calf veins. COMPARISON:  Lower extremity ultrasound 01/08/2017 FINDINGS: VENOUS Normal compressibility of the common femoral, superficial femoral, and popliteal veins. The calf veins are poorly visualized. Visualized portions of profunda femoral vein and great saphenous vein unremarkable. No filling defects to suggest DVT on grayscale or color Doppler imaging. Doppler waveforms show normal direction of venous flow, normal respiratory plasticity and response to  augmentation. Limited views of the contralateral common femoral vein are unremarkable. OTHER None. Limitations: none IMPRESSION: Negative. Electronically Signed   By: Minerva Fester M.D.   On: 05/20/2023 00:02   DG Chest Port 1 View  Result Date: 05/19/2023 CLINICAL DATA:  Shortness of breath EXAM: PORTABLE CHEST 1 VIEW COMPARISON:  Radiographs 06/07/2019 FINDINGS: Stable cardiomegaly. Pulmonary vascular congestion. Right basilar atelectasis or infiltrates. No pleural effusion or pneumothorax. No displaced rib fractures. IMPRESSION: 1. Cardiomegaly with pulmonary vascular congestion. 2. Right basilar atelectasis or infiltrates. Electronically Signed   By: Minerva Fester M.D.   On: 05/19/2023 22:18   NM Bone Scan Whole Body  Result Date: 05/09/2023 CLINICAL DATA:  High-risk prostate cancer, staging EXAM: NUCLEAR MEDICINE WHOLE BODY BONE SCAN TECHNIQUE: Whole body anterior and posterior images were obtained approximately 3 hours after intravenous injection of radiopharmaceutical. RADIOPHARMACEUTICALS:  21.95 mCi Technetium-30m MDP IV COMPARISON:  None available Radiographic correlation: CTA abdomen and pelvis 04/05/2023 FINDINGS: Uptake at shoulders, sternoclavicular joints,  RIGHT knee, both elbows, RIGHT wrist, typically degenerative. RIGHT hip and RIGHT knee prostheses identified. Uptake adjacent to RIGHT hip prosthesis, could be related to placement 1 year ago though with the intense degree of uptake, loosening and metastatic disease not completely excluded, recommend radiographic correlation. Uptake in BILATERAL aspects of lower lumbar spine corresponding to degenerative changes on CT. No additional worrisome sites of tracer uptake are identified to suggest osseous metastases. Expected urinary tract and soft tissue distribution of tracer. IMPRESSION: Intense tracer uptake at proximal RIGHT femur and in RIGHT pelvis adjacent to the RIGHT hip prosthesis, potentially postoperative in origin with prosthesis placed in April 2023, though uptake on bone scintigraphy is nonspecific and aseptic loosening of the prosthesis and metastatic disease are not entirely excluded; recommend radiographic correlation. No additional worrisome sites of abnormal tracer uptake are identified. Electronically Signed   By: Ulyses Southward M.D.   On: 05/09/2023 16:03    Microbiology: Recent Results (from the past 240 hour(s))  Culture, blood (routine x 2)     Status: None (Preliminary result)   Collection Time: 05/20/23  3:28 AM   Specimen: BLOOD RIGHT HAND  Result Value Ref Range Status   Specimen Description BLOOD RIGHT HAND  Final   Special Requests   Final    BOTTLES DRAWN AEROBIC AND ANAEROBIC Blood Culture adequate volume   Culture   Final    NO GROWTH 3 DAYS Performed at [facility], [address] [place], [place] [address]    Report Status PENDING  Incomplete  Culture, blood (routine x 2)     Status: None (Preliminary result)   Collection Time: 05/20/23  3:28 AM   Specimen: BLOOD LEFT ARM  Result Value Ref Range Status   Specimen Description BLOOD LEFT ARM  Final   Special Requests   Final    BOTTLES DRAWN AEROBIC AND ANAEROBIC Blood Culture adequate volume   Culture    Final    NO GROWTH 3 DAYS Performed at [facility], [address], [place], [place] [address]    Report Status PENDING  Incomplete     Labs: Basic Metabolic Panel: Recent Labs  Lab 05/19/23 2159 05/21/23 0449 05/22/23 0502  NA 137 134*  --   K 4.2 3.6  --   CL 101 102  --   CO2 25 25  --   GLUCOSE 181* 145*  --   BUN 15 10  --   CREATININE 1.09 0.80 0.88  CALCIUM 9.0 8.0*  --  Liver Function Tests: No results for input(s): "AST", "ALT", "ALKPHOS", "BILITOT", "PROT", "ALBUMIN" in the last 168 hours. No results for input(s): "LIPASE", "AMYLASE" in the last 168 hours. No results for input(s): "AMMONIA" in the last 168 hours. CBC: Recent Labs  Lab 05/19/23 2159 05/21/23 0449 05/22/23 0502 05/23/23 0508  WBC 14.0* 14.1* 14.9* 12.5*  HGB 13.1 11.4* 11.6* 12.2*  HCT 42.4 35.3* 36.5* 37.9*  MCV 80.9 77.9* 78.7* 77.3*  PLT 333 280 272 310   Cardiac Enzymes: No results for input(s): "CKTOTAL", "CKMB", "CKMBINDEX", "TROPONINI" in the last 168 hours. BNP: BNP (last 3 results) Recent Labs    05/19/23 2159  BNP 140.5*    ProBNP (last 3 results) No results for input(s): "PROBNP" in the last 8760 hours.  CBG: Recent Labs  Lab 05/21/23 0803  GLUCAP 114*       Signed:  Silvano Bilis MD.  [facility] 05/23/2023, 11:28 AM

## 2023-05-24 ENCOUNTER — Ambulatory Visit: Payer: Medicare HMO | Admitting: Urology

## 2023-05-24 LAB — CULTURE, BLOOD (ROUTINE X 2): Culture: NO GROWTH

## 2023-05-25 LAB — CULTURE, BLOOD (ROUTINE X 2): Special Requests: ADEQUATE

## 2023-05-29 ENCOUNTER — Ambulatory Visit: Payer: Medicare HMO

## 2023-06-10 ENCOUNTER — Telehealth: Payer: Self-pay | Admitting: Urology

## 2023-06-10 NOTE — Telephone Encounter (Signed)
Pt is currently taking Eliquis, (just started, prescribed in ER) he stopped by office to r/s appt for Eliguard and gold seed markers, on 6/21.

## 2023-06-14 ENCOUNTER — Telehealth: Payer: Self-pay | Admitting: Infectious Diseases

## 2023-06-14 NOTE — Telephone Encounter (Signed)
I am adding Genelle Bal in radiation oncology.  Dr. Rushie Chestnut also recommended hormonal therapy with ADT which he can go ahead and start so I think his radiation can be delayed until he is okay to hold the Eliquis.

## 2023-06-14 NOTE — Telephone Encounter (Signed)
This patient had recent PE and is on eliquis. He tells me he needs to stop eliquis prior to his urological procedure for prostate cancer but probably not a good idea so close to PE. Tammy Sours you put an IVC in him and removed in past since he had prior DVT and then hip surgery.  Would you be able to see him to help decide on how to proceed with anticoag, periop or should he see oncology? Scott how urgent is it to proceed with this surgery and radiation? Thanks

## 2023-06-18 ENCOUNTER — Encounter: Payer: Medicare HMO | Admitting: Physician Assistant

## 2023-06-21 ENCOUNTER — Ambulatory Visit: Payer: Medicare HMO | Admitting: Urology

## 2023-06-25 ENCOUNTER — Ambulatory Visit: Payer: Medicare HMO

## 2023-06-28 ENCOUNTER — Telehealth: Payer: Self-pay

## 2023-06-28 NOTE — Telephone Encounter (Signed)
-----   Message from Riki Altes, MD sent at 06/27/2023  4:40 PM EDT ----- Regarding: RE: Markers and Eligard He needs to get his Eligard injection as this is a prostate cancer treatment this will shrink the prostate and prostate cancer cells and allow the radiation to be more effective.  Dr. Rushie Chestnut said he could go ahead and treat without markers or after Eligard can wait a few months until he can come off and then have the markers placed.  Would recommend he make a follow-up appointment in radiation oncology to discuss this further with Dr. Rushie Chestnut ----- Message ----- From: Debbe Bales, CMA Sent: 06/24/2023  10:45 AM EDT To: Riki Altes, MD; Levada Schilling, CMA Subject: FW: Markers and Eligard                        This is a [facility] patient, please advise and assist with next steps.   Thanks,  -OG   ----- Message ----- From: Dorcas Carrow, RN Sent: 06/24/2023  10:12 AM EDT To: Darol Destine, CMA; Debbe Bales, CMA Subject: Markers and Eligard                            Good Morning,   Do you know if there has been any updates on what the plan is for this patient?  The patient called this morning and feels that he is not getting any answers on the plan.  \  I know that he needs to come to stop the Eliquis in order to put the markers in and the cardiologist does not want to do that, and Mr Herpel mentioned a stent, but he has not heard anything about that procedure.  He seems a little lost about where things are.    Thanks,  Ricki Rodriguez

## 2023-06-28 NOTE — Telephone Encounter (Signed)
Called pt scheduled him for Eligard injection, prior auth previously obtained. Informed pt of the information below, pt voiced understanding.

## 2023-07-01 NOTE — Telephone Encounter (Signed)
Appt made for 08/01/2023 @9 :00am Gold Seed. Unless cancer center states we need to do it a different day.

## 2023-07-03 ENCOUNTER — Ambulatory Visit: Payer: Medicare HMO

## 2023-07-11 ENCOUNTER — Encounter: Payer: Self-pay | Admitting: Physician Assistant

## 2023-07-11 ENCOUNTER — Ambulatory Visit: Payer: Medicare HMO | Admitting: Physician Assistant

## 2023-07-11 VITALS — BP 163/81 | HR 90 | Wt 278.0 lb

## 2023-07-11 DIAGNOSIS — C61 Malignant neoplasm of prostate: Secondary | ICD-10-CM | POA: Diagnosis not present

## 2023-07-11 MED ORDER — LEUPROLIDE ACETATE (6 MONTH) 45 MG ~~LOC~~ KIT
45.0000 mg | PACK | Freq: Once | SUBCUTANEOUS | Status: AC
Start: 2023-07-11 — End: 2023-07-11
  Administered 2023-07-11: 45 mg via SUBCUTANEOUS

## 2023-07-11 NOTE — Progress Notes (Signed)
Eligard SubQ Injection   Due to Prostate Cancer patient is present today for a Eligard Injection.  Medication: Eligard 1 month Dose: 45 mg  Location: right  Lot: [id number] Exp: 08/2024  Patient tolerated well, no complications were noted  Performed by: [place] Pink RMA

## 2023-07-11 NOTE — Progress Notes (Signed)
Patient presented to clinic today for initiation of 6 months of ADT with Eligard per Drs. Stoioff and Chrystal.   Patient reports working out 4 times weekly with cardio and weight bearing exercise.  He has a history of diabetes, HLD, HTN. He monitors his glucose periodically.  We discussed the anticipated side effects of ADT today including hot flashes, decreased libido, erectile dysfunction, breast tenderness or size changes, fatigue, weight gain, bone loss, muscle mass loss, brain fog, increased cholesterol, increased blood pressure, increased blood sugar, and increased risk for stroke and heart attack.  I counseled him to maintain a healthy diet and continue regular exercise including weightbearing exercise to mitigate bone and muscle loss for the duration of therapy.  Additionally, I counseled him to start daily calcium 1000-1200mg  and vitamin D 800-1000IU supplements to reduce bone loss.  Lastly, I encouraged him to maintain regular follow-ups with his PCP for monitoring of his metabolic syndrome. He expressed understanding, all questions answered. Printed resources on ADT provided today.  NOTE: I also spoke with Drs. Devona Konig, and Iago today to clarify his treatment plan. He is to STOP Eliquis 2 days prior to scheduled gold seed placement on 8/1 with Dr. Lonna Cobb and keep plans for CT simulation with Dr. Rushie Chestnut on 8/8. We reviewed pre-gold seed instructions including using Fleet enema that morning.  Carman Ching, PA-C 07/11/23 9:00 AM  I spent 25 minutes on the day of the encounter to include pre-visit record review, face-to-face time with the patient, and post-visit ordering of tests.

## 2023-07-11 NOTE — Patient Instructions (Addendum)
Please take the following dietary supplements for the duration of your hormone suppression therapy to reduce your risk for bone loss: -Calcium 1000-1200mg  daily -Vitamin D 800-1000IU daily    Gold Seed Placement Instructions  STOP all NSAIDS 7 days prior and STOP Eliquis 2 days prior to the procedure.  If you have any questions about stopping these medications, please contact your primary care physician or cardiologist.  Having a light meal prior to the procedure is recommended.  If you are diabetic or have low blood sugar please bring a small snack or glucose tablet.  A Fleets enema is needed to be purchased over the counter at a local pharmacy and used 2 hours before you scheduled appointment.  This can be purchased over the counter at any pharmacy.  Antibiotics will be administered in the clinic at the time of the procedure unless otherwise specified.    Please bring someone with you to the procedure to drive you home.  A follow up appointment has been scheduled for you to receive the results of the biopsy.  If you have any questions or concerns, please feel free to call the office at [phone] or send a Mychart message.    Thank you, Staff at [facility]

## 2023-08-01 ENCOUNTER — Other Ambulatory Visit: Payer: Medicare HMO | Admitting: Urology

## 2023-08-01 ENCOUNTER — Encounter: Payer: Self-pay | Admitting: Urology

## 2023-08-01 VITALS — BP 153/87 | HR 84 | Ht 76.0 in | Wt 278.0 lb

## 2023-08-01 DIAGNOSIS — C61 Malignant neoplasm of prostate: Secondary | ICD-10-CM

## 2023-08-01 DIAGNOSIS — Z2989 Encounter for other specified prophylactic measures: Secondary | ICD-10-CM

## 2023-08-01 DIAGNOSIS — R972 Elevated prostate specific antigen [PSA]: Secondary | ICD-10-CM

## 2023-08-01 MED ORDER — LEVOFLOXACIN 500 MG PO TABS
500.0000 mg | ORAL_TABLET | Freq: Once | ORAL | Status: AC
Start: 2023-08-01 — End: 2023-08-01
  Administered 2023-08-01: 500 mg via ORAL

## 2023-08-01 MED ORDER — GENTAMICIN SULFATE 40 MG/ML IJ SOLN
80.0000 mg | Freq: Once | INTRAMUSCULAR | Status: AC
Start: 2023-08-01 — End: 2023-08-01
  Administered 2023-08-01: 80 mg via INTRAMUSCULAR

## 2023-08-01 NOTE — Progress Notes (Signed)
   08/01/23  CC: gold fiducial marker placement  HPI: 78 y.o. 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 Rushie Chestnut.  Leuprolide given 07/11/2023.  Prostate Gold fiducial Marker Placement Procedure   Informed consent was obtained after discussing risks/benefits of the procedure.  A time out was performed to ensure correct patient identity.  Pre-Procedure: - Gentamicin given prophylactically - PO Levaquin 500 mg also given today  Procedure: - Lidocaine jelly was administered per rectum - Rectal ultrasound probe was placed without difficulty and the prostate visualized - Prostatic block performed with 10 mL 1% Xylocaine - 3 fiducial gold seed markers placed, one at right base, one at left base, one at apex of prostate gland under transrectal ultrasound guidance  Post-Procedure: - Patient tolerated the procedure well - He was counseled to seek immediate medical attention if experiences any severe pain, significant bleeding, or fevers    Irineo Axon, MD

## 2023-08-08 ENCOUNTER — Ambulatory Visit
Admission: RE | Admit: 2023-08-08 | Discharge: 2023-08-08 | Disposition: A | Discharge status: Home / Self Care | Payer: Medicare HMO | Source: Ambulatory Visit | Attending: Radiation Oncology | Admitting: Radiation Oncology

## 2023-08-08 DIAGNOSIS — Z510 Encounter for antineoplastic radiation therapy: Secondary | ICD-10-CM | POA: Insufficient documentation

## 2023-08-08 DIAGNOSIS — C61 Malignant neoplasm of prostate: Secondary | ICD-10-CM | POA: Insufficient documentation

## 2023-08-09 DIAGNOSIS — Z510 Encounter for antineoplastic radiation therapy: Secondary | ICD-10-CM | POA: Diagnosis not present

## 2023-08-16 ENCOUNTER — Other Ambulatory Visit: Payer: Self-pay | Admitting: *Deleted

## 2023-08-16 DIAGNOSIS — C61 Malignant neoplasm of prostate: Secondary | ICD-10-CM

## 2023-08-19 ENCOUNTER — Ambulatory Visit
Admission: RE | Admit: 2023-08-19 | Discharge: 2023-08-19 | Disposition: A | Discharge status: Home / Self Care | Payer: Medicare HMO | Source: Ambulatory Visit | Attending: Radiation Oncology | Admitting: Radiation Oncology

## 2023-08-20 ENCOUNTER — Ambulatory Visit: Admission: RE | Admit: 2023-08-20 | Payer: Medicare HMO | Source: Ambulatory Visit

## 2023-08-20 ENCOUNTER — Other Ambulatory Visit: Payer: Self-pay

## 2023-08-20 DIAGNOSIS — Z510 Encounter for antineoplastic radiation therapy: Secondary | ICD-10-CM | POA: Diagnosis not present

## 2023-08-20 LAB — RAD ONC ARIA SESSION SUMMARY
Course Elapsed Days: 0
Plan Fractions Treated to Date: 1
Plan Prescribed Dose Per Fraction: 2.5 Gy
Plan Total Fractions Prescribed: 28
Plan Total Prescribed Dose: 70 Gy
Reference Point Dosage Given to Date: 2.5 Gy
Reference Point Session Dosage Given: 2.5 Gy
Session Number: 1

## 2023-08-21 ENCOUNTER — Ambulatory Visit
Admission: RE | Admit: 2023-08-21 | Discharge: 2023-08-21 | Disposition: A | Discharge status: Home / Self Care | Payer: Medicare HMO | Source: Ambulatory Visit | Attending: Radiation Oncology | Admitting: Radiation Oncology

## 2023-08-21 ENCOUNTER — Other Ambulatory Visit: Payer: Self-pay

## 2023-08-21 DIAGNOSIS — Z510 Encounter for antineoplastic radiation therapy: Secondary | ICD-10-CM | POA: Diagnosis not present

## 2023-08-21 LAB — RAD ONC ARIA SESSION SUMMARY
Course Elapsed Days: 1
Plan Fractions Treated to Date: 2
Plan Prescribed Dose Per Fraction: 2.5 Gy
Plan Total Fractions Prescribed: 28
Plan Total Prescribed Dose: 70 Gy
Reference Point Dosage Given to Date: 5 Gy
Reference Point Session Dosage Given: 2.5 Gy
Session Number: 2

## 2023-08-22 ENCOUNTER — Ambulatory Visit
Admission: RE | Admit: 2023-08-22 | Discharge: 2023-08-22 | Disposition: A | Discharge status: Home / Self Care | Payer: Medicare HMO | Source: Ambulatory Visit | Attending: Radiation Oncology | Admitting: Radiation Oncology

## 2023-08-22 ENCOUNTER — Other Ambulatory Visit: Payer: Self-pay

## 2023-08-22 DIAGNOSIS — Z510 Encounter for antineoplastic radiation therapy: Secondary | ICD-10-CM | POA: Diagnosis not present

## 2023-08-22 LAB — RAD ONC ARIA SESSION SUMMARY
Course Elapsed Days: 2
Plan Fractions Treated to Date: 3
Plan Prescribed Dose Per Fraction: 2.5 Gy
Plan Total Fractions Prescribed: 28
Plan Total Prescribed Dose: 70 Gy
Reference Point Dosage Given to Date: 7.5 Gy
Reference Point Session Dosage Given: 2.5 Gy
Session Number: 3

## 2023-08-23 ENCOUNTER — Other Ambulatory Visit: Payer: Self-pay

## 2023-08-23 ENCOUNTER — Ambulatory Visit
Admission: RE | Admit: 2023-08-23 | Discharge: 2023-08-23 | Disposition: A | Discharge status: Home / Self Care | Payer: Medicare HMO | Source: Ambulatory Visit | Attending: Radiation Oncology | Admitting: Radiation Oncology

## 2023-08-23 DIAGNOSIS — Z510 Encounter for antineoplastic radiation therapy: Secondary | ICD-10-CM | POA: Diagnosis not present

## 2023-08-23 LAB — RAD ONC ARIA SESSION SUMMARY
Course Elapsed Days: 3
Plan Fractions Treated to Date: 4
Plan Prescribed Dose Per Fraction: 2.5 Gy
Plan Total Fractions Prescribed: 28
Plan Total Prescribed Dose: 70 Gy
Reference Point Dosage Given to Date: 10 Gy
Reference Point Session Dosage Given: 2.5 Gy
Session Number: 4

## 2023-08-26 ENCOUNTER — Inpatient Hospital Stay: Payer: Medicare HMO | Attending: Radiation Oncology

## 2023-08-26 ENCOUNTER — Other Ambulatory Visit: Payer: Self-pay

## 2023-08-26 ENCOUNTER — Ambulatory Visit
Admission: RE | Admit: 2023-08-26 | Discharge: 2023-08-26 | Disposition: A | Discharge status: Home / Self Care | Payer: Medicare HMO | Source: Ambulatory Visit | Attending: Radiation Oncology | Admitting: Radiation Oncology

## 2023-08-26 DIAGNOSIS — Z510 Encounter for antineoplastic radiation therapy: Secondary | ICD-10-CM | POA: Diagnosis not present

## 2023-08-26 DIAGNOSIS — C61 Malignant neoplasm of prostate: Secondary | ICD-10-CM | POA: Diagnosis present

## 2023-08-26 LAB — CBC (CANCER CENTER ONLY)
HCT: 43.6 % (ref 39.0–52.0)
Hemoglobin: 13 g/dL (ref 13.0–17.0)
MCH: 22.8 pg — ABNORMAL LOW (ref 26.0–34.0)
MCHC: 29.8 g/dL — ABNORMAL LOW (ref 30.0–36.0)
MCV: 76.4 fL — ABNORMAL LOW (ref 80.0–100.0)
Platelet Count: 305 10*3/uL (ref 150–400)
RBC: 5.71 MIL/uL (ref 4.22–5.81)
RDW: 20.4 % — ABNORMAL HIGH (ref 11.5–15.5)
WBC Count: 8.4 10*3/uL (ref 4.0–10.5)
nRBC: 0 % (ref 0.0–0.2)

## 2023-08-26 LAB — RAD ONC ARIA SESSION SUMMARY
Course Elapsed Days: 6
Plan Fractions Treated to Date: 5
Plan Prescribed Dose Per Fraction: 2.5 Gy
Plan Total Fractions Prescribed: 28
Plan Total Prescribed Dose: 70 Gy
Reference Point Dosage Given to Date: 12.5 Gy
Reference Point Session Dosage Given: 2.5 Gy
Session Number: 5

## 2023-08-27 ENCOUNTER — Ambulatory Visit
Admission: RE | Admit: 2023-08-27 | Discharge: 2023-08-27 | Disposition: A | Discharge status: Home / Self Care | Payer: Medicare HMO | Source: Ambulatory Visit | Attending: Radiation Oncology | Admitting: Radiation Oncology

## 2023-08-27 ENCOUNTER — Other Ambulatory Visit: Payer: Self-pay

## 2023-08-27 DIAGNOSIS — Z510 Encounter for antineoplastic radiation therapy: Secondary | ICD-10-CM | POA: Diagnosis not present

## 2023-08-27 LAB — RAD ONC ARIA SESSION SUMMARY
Course Elapsed Days: 7
Plan Fractions Treated to Date: 6
Plan Prescribed Dose Per Fraction: 2.5 Gy
Plan Total Fractions Prescribed: 28
Plan Total Prescribed Dose: 70 Gy
Reference Point Dosage Given to Date: 15 Gy
Reference Point Session Dosage Given: 2.5 Gy
Session Number: 6

## 2023-08-28 ENCOUNTER — Other Ambulatory Visit: Payer: Self-pay

## 2023-08-28 ENCOUNTER — Ambulatory Visit
Admission: RE | Admit: 2023-08-28 | Discharge: 2023-08-28 | Disposition: A | Discharge status: Home / Self Care | Payer: Medicare HMO | Source: Ambulatory Visit | Attending: Radiation Oncology | Admitting: Radiation Oncology

## 2023-08-28 DIAGNOSIS — Z510 Encounter for antineoplastic radiation therapy: Secondary | ICD-10-CM | POA: Diagnosis not present

## 2023-08-28 LAB — RAD ONC ARIA SESSION SUMMARY
Course Elapsed Days: 8
Plan Fractions Treated to Date: 7
Plan Prescribed Dose Per Fraction: 2.5 Gy
Plan Total Fractions Prescribed: 28
Plan Total Prescribed Dose: 70 Gy
Reference Point Dosage Given to Date: 17.5 Gy
Reference Point Session Dosage Given: 2.5 Gy
Session Number: 7

## 2023-08-29 ENCOUNTER — Ambulatory Visit
Admission: RE | Admit: 2023-08-29 | Discharge: 2023-08-29 | Disposition: A | Discharge status: Home / Self Care | Payer: Medicare HMO | Source: Ambulatory Visit | Attending: Radiation Oncology | Admitting: Radiation Oncology

## 2023-08-29 ENCOUNTER — Other Ambulatory Visit: Payer: Self-pay

## 2023-08-29 DIAGNOSIS — Z510 Encounter for antineoplastic radiation therapy: Secondary | ICD-10-CM | POA: Diagnosis not present

## 2023-08-29 LAB — RAD ONC ARIA SESSION SUMMARY
Course Elapsed Days: 9
Plan Fractions Treated to Date: 8
Plan Prescribed Dose Per Fraction: 2.5 Gy
Plan Total Fractions Prescribed: 28
Plan Total Prescribed Dose: 70 Gy
Reference Point Dosage Given to Date: 20 Gy
Reference Point Session Dosage Given: 2.5 Gy
Session Number: 8

## 2023-08-30 ENCOUNTER — Other Ambulatory Visit: Payer: Self-pay

## 2023-08-30 ENCOUNTER — Ambulatory Visit
Admission: RE | Admit: 2023-08-30 | Discharge: 2023-08-30 | Disposition: A | Discharge status: Home / Self Care | Payer: Medicare HMO | Source: Ambulatory Visit | Attending: Radiation Oncology | Admitting: Radiation Oncology

## 2023-08-30 DIAGNOSIS — Z510 Encounter for antineoplastic radiation therapy: Secondary | ICD-10-CM | POA: Diagnosis not present

## 2023-08-30 LAB — RAD ONC ARIA SESSION SUMMARY
Course Elapsed Days: 10
Plan Fractions Treated to Date: 9
Plan Prescribed Dose Per Fraction: 2.5 Gy
Plan Total Fractions Prescribed: 28
Plan Total Prescribed Dose: 70 Gy
Reference Point Dosage Given to Date: 22.5 Gy
Reference Point Session Dosage Given: 2.5 Gy
Session Number: 9

## 2023-09-03 ENCOUNTER — Other Ambulatory Visit: Payer: Self-pay

## 2023-09-03 ENCOUNTER — Ambulatory Visit
Admission: RE | Admit: 2023-09-03 | Discharge: 2023-09-03 | Disposition: A | Discharge status: Home / Self Care | Payer: Medicare HMO | Source: Ambulatory Visit | Attending: Radiation Oncology | Admitting: Radiation Oncology

## 2023-09-03 DIAGNOSIS — C61 Malignant neoplasm of prostate: Secondary | ICD-10-CM | POA: Diagnosis present

## 2023-09-03 DIAGNOSIS — Z510 Encounter for antineoplastic radiation therapy: Secondary | ICD-10-CM | POA: Diagnosis present

## 2023-09-03 LAB — RAD ONC ARIA SESSION SUMMARY
Course Elapsed Days: 14
Plan Fractions Treated to Date: 10
Plan Prescribed Dose Per Fraction: 2.5 Gy
Plan Total Fractions Prescribed: 28
Plan Total Prescribed Dose: 70 Gy
Reference Point Dosage Given to Date: 25 Gy
Reference Point Session Dosage Given: 2.5 Gy
Session Number: 10

## 2023-09-04 ENCOUNTER — Other Ambulatory Visit: Payer: Self-pay

## 2023-09-04 ENCOUNTER — Ambulatory Visit
Admission: RE | Admit: 2023-09-04 | Discharge: 2023-09-04 | Disposition: A | Discharge status: Home / Self Care | Payer: Medicare HMO | Source: Ambulatory Visit | Attending: Radiation Oncology | Admitting: Radiation Oncology

## 2023-09-04 DIAGNOSIS — Z510 Encounter for antineoplastic radiation therapy: Secondary | ICD-10-CM | POA: Diagnosis not present

## 2023-09-04 LAB — RAD ONC ARIA SESSION SUMMARY
Course Elapsed Days: 15
Plan Fractions Treated to Date: 11
Plan Prescribed Dose Per Fraction: 2.5 Gy
Plan Total Fractions Prescribed: 28
Plan Total Prescribed Dose: 70 Gy
Reference Point Dosage Given to Date: 27.5 Gy
Reference Point Session Dosage Given: 2.5 Gy
Session Number: 11

## 2023-09-05 ENCOUNTER — Ambulatory Visit
Admission: RE | Admit: 2023-09-05 | Discharge: 2023-09-05 | Disposition: A | Discharge status: Home / Self Care | Payer: Medicare HMO | Source: Ambulatory Visit | Attending: Radiation Oncology | Admitting: Radiation Oncology

## 2023-09-05 ENCOUNTER — Other Ambulatory Visit: Payer: Self-pay

## 2023-09-05 DIAGNOSIS — Z510 Encounter for antineoplastic radiation therapy: Secondary | ICD-10-CM | POA: Diagnosis not present

## 2023-09-05 LAB — RAD ONC ARIA SESSION SUMMARY
Course Elapsed Days: 16
Plan Fractions Treated to Date: 12
Plan Prescribed Dose Per Fraction: 2.5 Gy
Plan Total Fractions Prescribed: 28
Plan Total Prescribed Dose: 70 Gy
Reference Point Dosage Given to Date: 30 Gy
Reference Point Session Dosage Given: 2.5 Gy
Session Number: 12

## 2023-09-06 ENCOUNTER — Ambulatory Visit
Admission: RE | Admit: 2023-09-06 | Discharge: 2023-09-06 | Disposition: A | Discharge status: Home / Self Care | Payer: Medicare HMO | Source: Ambulatory Visit | Attending: Radiation Oncology | Admitting: Radiation Oncology

## 2023-09-06 ENCOUNTER — Other Ambulatory Visit: Payer: Self-pay

## 2023-09-06 DIAGNOSIS — Z510 Encounter for antineoplastic radiation therapy: Secondary | ICD-10-CM | POA: Diagnosis not present

## 2023-09-06 LAB — RAD ONC ARIA SESSION SUMMARY
Course Elapsed Days: 17
Plan Fractions Treated to Date: 13
Plan Prescribed Dose Per Fraction: 2.5 Gy
Plan Total Fractions Prescribed: 28
Plan Total Prescribed Dose: 70 Gy
Reference Point Dosage Given to Date: 32.5 Gy
Reference Point Session Dosage Given: 2.5 Gy
Session Number: 13

## 2023-09-09 ENCOUNTER — Inpatient Hospital Stay: Payer: Medicare HMO | Attending: Radiation Oncology

## 2023-09-09 ENCOUNTER — Ambulatory Visit
Admission: RE | Admit: 2023-09-09 | Discharge: 2023-09-09 | Disposition: A | Discharge status: Home / Self Care | Payer: Medicare HMO | Source: Ambulatory Visit | Attending: Radiation Oncology | Admitting: Radiation Oncology

## 2023-09-09 ENCOUNTER — Other Ambulatory Visit: Payer: Self-pay

## 2023-09-09 DIAGNOSIS — C61 Malignant neoplasm of prostate: Secondary | ICD-10-CM

## 2023-09-09 DIAGNOSIS — Z510 Encounter for antineoplastic radiation therapy: Secondary | ICD-10-CM | POA: Diagnosis not present

## 2023-09-09 DIAGNOSIS — D509 Iron deficiency anemia, unspecified: Secondary | ICD-10-CM | POA: Insufficient documentation

## 2023-09-09 LAB — CBC (CANCER CENTER ONLY)
HCT: 45.3 % (ref 39.0–52.0)
Hemoglobin: 13.8 g/dL (ref 13.0–17.0)
MCH: 23.7 pg — ABNORMAL LOW (ref 26.0–34.0)
MCHC: 30.5 g/dL (ref 30.0–36.0)
MCV: 77.8 fL — ABNORMAL LOW (ref 80.0–100.0)
Platelet Count: 321 10*3/uL (ref 150–400)
RBC: 5.82 MIL/uL — ABNORMAL HIGH (ref 4.22–5.81)
RDW: 21.1 % — ABNORMAL HIGH (ref 11.5–15.5)
WBC Count: 6.3 10*3/uL (ref 4.0–10.5)
nRBC: 0 % (ref 0.0–0.2)

## 2023-09-09 LAB — RAD ONC ARIA SESSION SUMMARY
Course Elapsed Days: 20
Plan Fractions Treated to Date: 14
Plan Prescribed Dose Per Fraction: 2.5 Gy
Plan Total Fractions Prescribed: 28
Plan Total Prescribed Dose: 70 Gy
Reference Point Dosage Given to Date: 35 Gy
Reference Point Session Dosage Given: 2.5 Gy
Session Number: 14

## 2023-09-10 ENCOUNTER — Ambulatory Visit
Admission: RE | Admit: 2023-09-10 | Discharge: 2023-09-10 | Disposition: A | Discharge status: Home / Self Care | Payer: Medicare HMO | Source: Ambulatory Visit | Attending: Radiation Oncology | Admitting: Radiation Oncology

## 2023-09-10 ENCOUNTER — Other Ambulatory Visit: Payer: Self-pay

## 2023-09-10 DIAGNOSIS — Z510 Encounter for antineoplastic radiation therapy: Secondary | ICD-10-CM | POA: Diagnosis not present

## 2023-09-10 LAB — RAD ONC ARIA SESSION SUMMARY
Course Elapsed Days: 21
Plan Fractions Treated to Date: 15
Plan Prescribed Dose Per Fraction: 2.5 Gy
Plan Total Fractions Prescribed: 28
Plan Total Prescribed Dose: 70 Gy
Reference Point Dosage Given to Date: 37.5 Gy
Reference Point Session Dosage Given: 2.5 Gy
Session Number: 15

## 2023-09-11 ENCOUNTER — Other Ambulatory Visit: Payer: Self-pay

## 2023-09-11 ENCOUNTER — Ambulatory Visit
Admission: RE | Admit: 2023-09-11 | Discharge: 2023-09-11 | Disposition: A | Discharge status: Home / Self Care | Payer: Medicare HMO | Source: Ambulatory Visit | Attending: Radiation Oncology | Admitting: Radiation Oncology

## 2023-09-11 DIAGNOSIS — Z510 Encounter for antineoplastic radiation therapy: Secondary | ICD-10-CM | POA: Diagnosis not present

## 2023-09-11 LAB — RAD ONC ARIA SESSION SUMMARY
Course Elapsed Days: 22
Plan Fractions Treated to Date: 16
Plan Prescribed Dose Per Fraction: 2.5 Gy
Plan Total Fractions Prescribed: 28
Plan Total Prescribed Dose: 70 Gy
Reference Point Dosage Given to Date: 40 Gy
Reference Point Session Dosage Given: 2.5 Gy
Session Number: 16

## 2023-09-12 ENCOUNTER — Other Ambulatory Visit: Payer: Self-pay

## 2023-09-12 ENCOUNTER — Ambulatory Visit
Admission: RE | Admit: 2023-09-12 | Discharge: 2023-09-12 | Disposition: A | Discharge status: Home / Self Care | Payer: Medicare HMO | Source: Ambulatory Visit | Attending: Radiation Oncology | Admitting: Radiation Oncology

## 2023-09-12 DIAGNOSIS — Z510 Encounter for antineoplastic radiation therapy: Secondary | ICD-10-CM | POA: Diagnosis not present

## 2023-09-12 LAB — RAD ONC ARIA SESSION SUMMARY
Course Elapsed Days: 23
Plan Fractions Treated to Date: 17
Plan Prescribed Dose Per Fraction: 2.5 Gy
Plan Total Fractions Prescribed: 28
Plan Total Prescribed Dose: 70 Gy
Reference Point Dosage Given to Date: 42.5 Gy
Reference Point Session Dosage Given: 2.5 Gy
Session Number: 17

## 2023-09-13 ENCOUNTER — Ambulatory Visit
Admission: RE | Admit: 2023-09-13 | Discharge: 2023-09-13 | Disposition: A | Discharge status: Home / Self Care | Payer: Medicare HMO | Source: Ambulatory Visit | Attending: Radiation Oncology | Admitting: Radiation Oncology

## 2023-09-13 ENCOUNTER — Other Ambulatory Visit: Payer: Self-pay

## 2023-09-13 DIAGNOSIS — Z510 Encounter for antineoplastic radiation therapy: Secondary | ICD-10-CM | POA: Diagnosis not present

## 2023-09-13 LAB — RAD ONC ARIA SESSION SUMMARY
Course Elapsed Days: 24
Plan Fractions Treated to Date: 18
Plan Prescribed Dose Per Fraction: 2.5 Gy
Plan Total Fractions Prescribed: 28
Plan Total Prescribed Dose: 70 Gy
Reference Point Dosage Given to Date: 45 Gy
Reference Point Session Dosage Given: 2.5 Gy
Session Number: 18

## 2023-09-16 ENCOUNTER — Other Ambulatory Visit: Payer: Self-pay

## 2023-09-16 ENCOUNTER — Ambulatory Visit
Admission: RE | Admit: 2023-09-16 | Discharge: 2023-09-16 | Disposition: A | Discharge status: Home / Self Care | Payer: Medicare HMO | Source: Ambulatory Visit | Attending: Radiation Oncology | Admitting: Radiation Oncology

## 2023-09-16 DIAGNOSIS — Z510 Encounter for antineoplastic radiation therapy: Secondary | ICD-10-CM | POA: Diagnosis not present

## 2023-09-16 LAB — RAD ONC ARIA SESSION SUMMARY
Course Elapsed Days: 27
Plan Fractions Treated to Date: 19
Plan Prescribed Dose Per Fraction: 2.5 Gy
Plan Total Fractions Prescribed: 28
Plan Total Prescribed Dose: 70 Gy
Reference Point Dosage Given to Date: 47.5 Gy
Reference Point Session Dosage Given: 2.5 Gy
Session Number: 19

## 2023-09-17 ENCOUNTER — Other Ambulatory Visit: Payer: Self-pay

## 2023-09-17 ENCOUNTER — Ambulatory Visit
Admission: RE | Admit: 2023-09-17 | Discharge: 2023-09-17 | Disposition: A | Discharge status: Home / Self Care | Payer: Medicare HMO | Source: Ambulatory Visit | Attending: Radiation Oncology | Admitting: Radiation Oncology

## 2023-09-17 DIAGNOSIS — Z510 Encounter for antineoplastic radiation therapy: Secondary | ICD-10-CM | POA: Diagnosis not present

## 2023-09-17 LAB — RAD ONC ARIA SESSION SUMMARY
Course Elapsed Days: 28
Plan Fractions Treated to Date: 20
Plan Prescribed Dose Per Fraction: 2.5 Gy
Plan Total Fractions Prescribed: 28
Plan Total Prescribed Dose: 70 Gy
Reference Point Dosage Given to Date: 50 Gy
Reference Point Session Dosage Given: 2.5 Gy
Session Number: 20

## 2023-09-18 ENCOUNTER — Other Ambulatory Visit: Payer: Self-pay

## 2023-09-18 ENCOUNTER — Ambulatory Visit
Admission: RE | Admit: 2023-09-18 | Discharge: 2023-09-18 | Disposition: A | Discharge status: Home / Self Care | Payer: Medicare HMO | Source: Ambulatory Visit | Attending: Radiation Oncology | Admitting: Radiation Oncology

## 2023-09-18 DIAGNOSIS — Z510 Encounter for antineoplastic radiation therapy: Secondary | ICD-10-CM | POA: Diagnosis not present

## 2023-09-18 LAB — RAD ONC ARIA SESSION SUMMARY
Course Elapsed Days: 29
Plan Fractions Treated to Date: 21
Plan Prescribed Dose Per Fraction: 2.5 Gy
Plan Total Fractions Prescribed: 28
Plan Total Prescribed Dose: 70 Gy
Reference Point Dosage Given to Date: 52.5 Gy
Reference Point Session Dosage Given: 2.5 Gy
Session Number: 21

## 2023-09-19 ENCOUNTER — Other Ambulatory Visit: Payer: Self-pay

## 2023-09-19 ENCOUNTER — Ambulatory Visit
Admission: RE | Admit: 2023-09-19 | Discharge: 2023-09-19 | Disposition: A | Discharge status: Home / Self Care | Payer: Medicare HMO | Source: Ambulatory Visit | Attending: Radiation Oncology | Admitting: Radiation Oncology

## 2023-09-19 DIAGNOSIS — Z510 Encounter for antineoplastic radiation therapy: Secondary | ICD-10-CM | POA: Diagnosis not present

## 2023-09-19 LAB — RAD ONC ARIA SESSION SUMMARY
Course Elapsed Days: 30
Plan Fractions Treated to Date: 22
Plan Prescribed Dose Per Fraction: 2.5 Gy
Plan Total Fractions Prescribed: 28
Plan Total Prescribed Dose: 70 Gy
Reference Point Dosage Given to Date: 55 Gy
Reference Point Session Dosage Given: 2.5 Gy
Session Number: 22

## 2023-09-20 ENCOUNTER — Ambulatory Visit
Admission: RE | Admit: 2023-09-20 | Discharge: 2023-09-20 | Disposition: A | Discharge status: Home / Self Care | Payer: Medicare HMO | Source: Ambulatory Visit | Attending: Radiation Oncology | Admitting: Radiation Oncology

## 2023-09-20 ENCOUNTER — Other Ambulatory Visit: Payer: Self-pay

## 2023-09-20 DIAGNOSIS — Z510 Encounter for antineoplastic radiation therapy: Secondary | ICD-10-CM | POA: Diagnosis not present

## 2023-09-20 LAB — RAD ONC ARIA SESSION SUMMARY
Course Elapsed Days: 31
Plan Fractions Treated to Date: 23
Plan Prescribed Dose Per Fraction: 2.5 Gy
Plan Total Fractions Prescribed: 28
Plan Total Prescribed Dose: 70 Gy
Reference Point Dosage Given to Date: 57.5 Gy
Reference Point Session Dosage Given: 2.5 Gy
Session Number: 23

## 2023-09-23 ENCOUNTER — Inpatient Hospital Stay: Payer: Medicare HMO

## 2023-09-23 ENCOUNTER — Other Ambulatory Visit: Payer: Self-pay

## 2023-09-23 ENCOUNTER — Ambulatory Visit
Admission: RE | Admit: 2023-09-23 | Discharge: 2023-09-23 | Disposition: A | Discharge status: Home / Self Care | Payer: Medicare HMO | Source: Ambulatory Visit | Attending: Radiation Oncology | Admitting: Radiation Oncology

## 2023-09-23 DIAGNOSIS — Z510 Encounter for antineoplastic radiation therapy: Secondary | ICD-10-CM | POA: Diagnosis not present

## 2023-09-23 LAB — RAD ONC ARIA SESSION SUMMARY
Course Elapsed Days: 34
Plan Fractions Treated to Date: 24
Plan Prescribed Dose Per Fraction: 2.5 Gy
Plan Total Fractions Prescribed: 28
Plan Total Prescribed Dose: 70 Gy
Reference Point Dosage Given to Date: 60 Gy
Reference Point Session Dosage Given: 2.5 Gy
Session Number: 24

## 2023-09-24 ENCOUNTER — Ambulatory Visit
Admission: RE | Admit: 2023-09-24 | Discharge: 2023-09-24 | Disposition: A | Discharge status: Home / Self Care | Payer: Medicare HMO | Source: Ambulatory Visit | Attending: Radiation Oncology | Admitting: Radiation Oncology

## 2023-09-24 ENCOUNTER — Other Ambulatory Visit: Payer: Self-pay

## 2023-09-24 DIAGNOSIS — Z510 Encounter for antineoplastic radiation therapy: Secondary | ICD-10-CM | POA: Diagnosis not present

## 2023-09-24 LAB — RAD ONC ARIA SESSION SUMMARY
Course Elapsed Days: 35
Plan Fractions Treated to Date: 25
Plan Prescribed Dose Per Fraction: 2.5 Gy
Plan Total Fractions Prescribed: 28
Plan Total Prescribed Dose: 70 Gy
Reference Point Dosage Given to Date: 62.5 Gy
Reference Point Session Dosage Given: 2.5 Gy
Session Number: 25

## 2023-09-25 ENCOUNTER — Other Ambulatory Visit: Payer: Self-pay

## 2023-09-25 ENCOUNTER — Ambulatory Visit
Admission: RE | Admit: 2023-09-25 | Discharge: 2023-09-25 | Disposition: A | Discharge status: Home / Self Care | Payer: Medicare HMO | Source: Ambulatory Visit | Attending: Radiation Oncology | Admitting: Radiation Oncology

## 2023-09-25 DIAGNOSIS — Z510 Encounter for antineoplastic radiation therapy: Secondary | ICD-10-CM | POA: Diagnosis not present

## 2023-09-25 LAB — RAD ONC ARIA SESSION SUMMARY
Course Elapsed Days: 36
Plan Fractions Treated to Date: 26
Plan Prescribed Dose Per Fraction: 2.5 Gy
Plan Total Fractions Prescribed: 28
Plan Total Prescribed Dose: 70 Gy
Reference Point Dosage Given to Date: 65 Gy
Reference Point Session Dosage Given: 2.5 Gy
Session Number: 26

## 2023-09-26 ENCOUNTER — Other Ambulatory Visit: Payer: Self-pay

## 2023-09-26 ENCOUNTER — Ambulatory Visit
Admission: RE | Admit: 2023-09-26 | Discharge: 2023-09-26 | Disposition: A | Discharge status: Home / Self Care | Payer: Medicare HMO | Source: Ambulatory Visit | Attending: Radiation Oncology | Admitting: Radiation Oncology

## 2023-09-26 DIAGNOSIS — Z510 Encounter for antineoplastic radiation therapy: Secondary | ICD-10-CM | POA: Diagnosis not present

## 2023-09-26 LAB — RAD ONC ARIA SESSION SUMMARY
Course Elapsed Days: 37
Plan Fractions Treated to Date: 27
Plan Prescribed Dose Per Fraction: 2.5 Gy
Plan Total Fractions Prescribed: 28
Plan Total Prescribed Dose: 70 Gy
Reference Point Dosage Given to Date: 67.5 Gy
Reference Point Session Dosage Given: 2.5 Gy
Session Number: 27

## 2023-09-27 ENCOUNTER — Other Ambulatory Visit: Payer: Self-pay

## 2023-09-27 ENCOUNTER — Ambulatory Visit
Admission: RE | Admit: 2023-09-27 | Discharge: 2023-09-27 | Disposition: A | Discharge status: Home / Self Care | Payer: Medicare HMO | Source: Ambulatory Visit | Attending: Radiation Oncology | Admitting: Radiation Oncology

## 2023-09-27 DIAGNOSIS — Z510 Encounter for antineoplastic radiation therapy: Secondary | ICD-10-CM | POA: Diagnosis not present

## 2023-09-27 LAB — RAD ONC ARIA SESSION SUMMARY
Course Elapsed Days: 38
Plan Fractions Treated to Date: 28
Plan Prescribed Dose Per Fraction: 2.5 Gy
Plan Total Fractions Prescribed: 28
Plan Total Prescribed Dose: 70 Gy
Reference Point Dosage Given to Date: 70 Gy
Reference Point Session Dosage Given: 2.5 Gy
Session Number: 28

## 2023-10-30 ENCOUNTER — Encounter: Payer: Self-pay | Admitting: Radiation Oncology

## 2023-10-30 ENCOUNTER — Other Ambulatory Visit: Payer: Self-pay | Admitting: *Deleted

## 2023-10-30 ENCOUNTER — Ambulatory Visit
Admission: RE | Admit: 2023-10-30 | Discharge: 2023-10-30 | Disposition: A | Discharge status: Home / Self Care | Payer: Medicare HMO | Source: Ambulatory Visit | Attending: Radiation Oncology | Admitting: Radiation Oncology

## 2023-10-30 VITALS — BP 120/80 | HR 97 | Temp 98.0°F | Resp 16 | Ht 75.0 in | Wt 278.0 lb

## 2023-10-30 DIAGNOSIS — K5900 Constipation, unspecified: Secondary | ICD-10-CM | POA: Insufficient documentation

## 2023-10-30 DIAGNOSIS — C61 Malignant neoplasm of prostate: Secondary | ICD-10-CM

## 2023-10-30 DIAGNOSIS — R350 Frequency of micturition: Secondary | ICD-10-CM | POA: Insufficient documentation

## 2023-10-30 DIAGNOSIS — Z923 Personal history of irradiation: Secondary | ICD-10-CM | POA: Diagnosis not present

## 2023-10-30 MED ORDER — TAMSULOSIN HCL 0.4 MG PO CAPS: 0.4000 mg | ORAL_CAPSULE | Freq: Every day | ORAL | 3 refills | Status: DC

## 2023-10-30 NOTE — Progress Notes (Signed)
Radiation Oncology Follow up Note  Name: Evan [name]   Date:   10/30/2023 MRN:  [id number] DOB: Feb 28, 1945    This 78 y.o. 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 He reports increased urinary frequency, waking up three to four times a night to urinate. He denies taking any medications for this symptom. Additionally, he reports constipation, describing his stools as hard. He denies diarrhea..  COMPLICATIONS OF TREATMENT: none  FOLLOW UP COMPLIANCE: keeps appointments   PHYSICAL EXAM:  BP 120/80   Pulse 97   Temp 98 F (36.7 C) (Tympanic)   Resp 16   Ht 6\' 3"  (1.905 m)   Wt 278 lb (126.1 kg)   BMI 34.75 kg/m  Well-developed well-nourished patient in NAD. HEENT reveals PERLA, EOMI, discs not visualized.  Oral cavity is clear. No oral mucosal lesions are identified. Neck is clear without evidence of cervical or supraclavicular adenopathy. Lungs are clear to A&P. Cardiac examination is essentially unremarkable with regular rate and rhythm without murmur rub or thrill. Abdomen is benign with no organomegaly or masses noted. Motor sensory and DTR levels are equal and symmetric in the upper and lower extremities. Cranial nerves II through XII are grossly intact. Proprioception is intact. No peripheral adenopathy or edema is identified. No motor or sensory levels are noted. Crude visual fields are within normal range.  RADIOLOGY RESULTS: No current films for review  PLAN: Prostate Cancer (Stage IIc, Gleason 7 (4+3), PSA 6.8) Recently completed IMRT radiation therapy. No current symptoms related to cancer. -Check PSA in 3 months to  assess response to treatment.  Nocturia Reports waking up 3-4 times per night to urinate. No current treatment. -Start Flomax, taken in the evening after dinner, to help empty bladder and reduce frequency of urination.  Constipation Reports difficulty with bowel movements, more constipation than diarrhea. -No specific plan discussed in the conversation.  Follow-up in 3 months with a blood test prior to the visit.    Carmina Miller, MD

## 2023-12-18 ENCOUNTER — Ambulatory Visit: Payer: Medicare HMO | Admitting: Physician Assistant

## 2024-01-13 ENCOUNTER — Encounter: Payer: Self-pay | Admitting: Cardiovascular Disease

## 2024-01-13 ENCOUNTER — Ambulatory Visit: Payer: Medicare HMO | Attending: Cardiovascular Disease | Admitting: Cardiovascular Disease

## 2024-01-13 VITALS — BP 100/60 | HR 78 | Ht 75.0 in | Wt 268.4 lb

## 2024-01-13 DIAGNOSIS — I10 Essential (primary) hypertension: Secondary | ICD-10-CM

## 2024-01-13 DIAGNOSIS — I4891 Unspecified atrial fibrillation: Secondary | ICD-10-CM | POA: Diagnosis not present

## 2024-01-13 DIAGNOSIS — I429 Cardiomyopathy, unspecified: Secondary | ICD-10-CM

## 2024-01-13 DIAGNOSIS — I5032 Chronic diastolic (congestive) heart failure: Secondary | ICD-10-CM

## 2024-01-13 DIAGNOSIS — I700 Atherosclerosis of aorta: Secondary | ICD-10-CM | POA: Diagnosis not present

## 2024-01-13 MED ORDER — EMPAGLIFLOZIN 10 MG PO TABS: 10.0000 mg | ORAL_TABLET | Freq: Every day | ORAL | 3 refills | Status: DC

## 2024-01-13 MED ORDER — FUROSEMIDE 40 MG PO TABS: 40.0000 mg | ORAL_TABLET | Freq: Every day | ORAL | 3 refills | Status: DC

## 2024-01-13 MED ORDER — AMIODARONE HCL 200 MG PO TABS: 200.0000 mg | ORAL_TABLET | Freq: Every day | ORAL | 1 refills | Status: DC

## 2024-01-13 NOTE — Patient Instructions (Addendum)
 Medication Instructions:  Refill amiodarone  200 mg daily (total care) Refill the jardiance  10 mg daily   If you need a refill on your cardiac medications before your next appointment, please call your pharmacy.   Lab work: No new labs needed  Testing/Procedures:  Limited Echocardiogram in 3 months.  Your physician has requested that you have an echocardiogram. Echocardiography is a painless test that uses sound waves to create images of your heart. It provides your doctor with information about the size and shape of your heart and how well your heart's chambers and valves are working.   You may receive an ultrasound enhancing agent through an IV if needed to better visualize your heart during the echo. This procedure takes approximately one hour.  There are no restrictions for this procedure.  This will take place at [address] [facility]) [address], [place] [address]  Please note: We ask at that you not bring children with you during ultrasound (echo/ vascular) testing. Due to room size and safety concerns, children are not allowed in the ultrasound rooms during exams. Our front office staff cannot provide observation of children in our lobby area while testing is being conducted. An adult accompanying a patient to their appointment will only be allowed in the ultrasound room at the discretion of the ultrasound technician under special circumstances. We apologize for any inconvenience.   Follow-Up: At [facility], you and your health needs are our priority.  As part of our continuing mission to provide you with exceptional heart care, we have created designated Provider Care Teams.  These Care Teams include your primary Cardiologist (physician) and Advanced Practice Providers (APPs -  Physician Assistants and Nurse Practitioners) who all work together to provide you with the care you need, when you need it.  You will need a follow up appointment in 3 months with APP after  echocardiogram  Providers on your designated Care Team:   Lonni Meager, NP Bernardino Bring, PA-C Cadence [place], [place]  COVID-19 Vaccine Information can be found at: [email or web address] For questions related to vaccine distribution or appointments, please email vaccine@[email or web address] or call [phone].

## 2024-01-13 NOTE — Progress Notes (Signed)
 Cardiology Office Note  Date:  01/13/2024   ID:  Coalton, Arch [birth year], MRN [id number]  PCP:  Epifanio Alm SQUIBB, MD   Chief Complaint  Patient presents with   New Patient (Initial Visit)    Ref by Dr. Daune for A-fib & CHF; patient had a cardioversion at [facility] 12/2023. Patient saw Dr. Debby for A-Fib ablation in 2010 at [facility].     HPI:  [name] is a 79 year old gentleman with past medical history of DVT, pulmonary embolism May 2024 GI bleed Diabetes type 2 Hyperlipidemia Essential hypertension Prostate cancer status post radiation Persistent atrial fibrillation status post ablation 2010  Atrial flutter, cardioversion December 10, 2023 OSA on CPAP Who presents for new patient evaluation of his atrial fibrillation  Recent hospital admission to [facility] December 2024shortness of breath, atrial fibrillation/flutter Cardioverted on December 10, 2023 Started on GDMT for ejection fraction 25 to 30% Medication list including Jardiance  10 mg daily, Lasix  80 mg daily losartan  50 mg daily metoprolol  succinate 25 daily spironolactone 25 daily  Given worsening renal dysfunction with primary care last week, Lasix  decreased down to 40 mg daily  Echocardiogram December 06, 2023 Ejection fraction 25 to 30%, normal RV size with mildly reduced function  Echo November 2016 normal right and left ventricular size and function EF 55%  Weight at home 262 to 263, unchanged since his hospital discharge Weighs himself daily at home  Denies significant leg edema, no PND orthopnea, no abdominal distention, breathing improved  EKG personally reviewed by myself on todays visit EKG Interpretation Date/Time:  Monday January 13 2024 16:34:14 EST Ventricular Rate:  78 PR Interval:  296 QRS Duration:  172 QT Interval:  428 QTC Calculation: 487 R Axis:   -40  Text Interpretation: Sinus rhythm with 1st degree A-V block Left axis deviation Left bundle branch block When compared  with ECG of 19-May-2023 21:52, Premature ventricular complexes are no longer Present PR interval has increased Vent. rate has decreased BY  42 BPM Nonspecific T wave abnormality no longer evident in Inferior leads Confirmed by Perla Lye [phone]) on 01/13/2024 4:36:53 PM     PMH:   has a past medical history of Adenomatous polyp of colon, Arthritis, Atrial fibrillation (HCC), BPH (benign prostatic hyperplasia), CHF (congestive heart failure) (HCC), COPD (chronic obstructive pulmonary disease) (HCC), Diabetes mellitus without complication (HCC), Discitis, Diverticulitis, Diverticulosis, DVT (deep venous thrombosis) (HCC), Erectile dysfunction, Hearing loss, Hyperlipidemia, Hypertension, Ischemic optic neuropathy, Lower GI bleeding, Lower GI bleeding (02/23/2018), Lumbar degenerative disc disease, Non-ischemic cardiomyopathy (HCC), and Sleep apnea.  PSH:    Past Surgical History:  Procedure Laterality Date   ATRIAL ABLATION SURGERY  2010   CATARACT EXTRACTION W/PHACO Right 10/10/2020   Procedure: CATARACT EXTRACTION PHACO AND INTRAOCULAR LENS PLACEMENT (IOC) RIGHT ISTENT INJ DIABETIC 2.64  00:29.4;  Surgeon: Myrna Adine Anes, MD;  Location: [facility];  Service: Ophthalmology;  Laterality: Right;  Diabetic - oral meds   CATARACT EXTRACTION W/PHACO Left 03/06/2021   Procedure: CATARACT EXTRACTION PHACO AND INTRAOCULAR LENS PLACEMENT (IOC) LEFT DIABETIC ISTENT INJ 3.62 00:30.6;  Surgeon: Myrna Adine Anes, MD;  Location: [facility];  Service: Ophthalmology;  Laterality: Left;  Diabetic - oral meds   CHOLECYSTECTOMY  2011   COLONOSCOPY Left 02/24/2018   Procedure: COLONOSCOPY;  Surgeon: Janalyn Keene NOVAK, MD;  Location: [facility];  Service: Endoscopy;  Laterality: Left;   COLONOSCOPY     2008, 2010, 2013   COLONOSCOPY N/A 04/06/2023   Procedure: COLONOSCOPY;  Surgeon: Toledo, Ladell POUR, MD;  Location: [facility] END[facility];  Service: Gastroenterology;  Laterality: N/A;    COLONOSCOPY WITH PROPOFOL  N/A 09/25/2017   Procedure: COLONOSCOPY WITH PROPOFOL ;  Surgeon: Viktoria Lamar DASEN, MD;  Location: [facility] END[facility]COPY;  Service: Endoscopy;  Laterality: N/A;   COLONOSCOPY WITH PROPOFOL  N/A 02/18/2018   Procedure: COLONOSCOPY WITH PROPOFOL ;  Surgeon: Jinny Carmine, MD;  Location: [facility] END[facility];  Service: Endoscopy;  Laterality: N/A;   ERCP  2011   ESOPHAGOGASTRODUODENOSCOPY Left 02/23/2018   Procedure: ESOPHAGOGASTRODUODENOSCOPY (EGD);  Surgeon: Janalyn Keene NOVAK, MD;  Location: [facility];  Service: Endoscopy;  Laterality: Left;   ESOPHAGOGASTRODUODENOSCOPY  2014   IVC FILTER INSERTION N/A 04/17/2022   Procedure: IVC FILTER INSERTION;  Surgeon: Jama Cordella MATSU, MD;  Location: [facility];  Service: Cardiovascular;  Laterality: N/A;   IVC FILTER REMOVAL N/A 07/31/2022   Procedure: IVC FILTER REMOVAL;  Surgeon: Jama Cordella MATSU, MD;  Location: [facility];  Service: Cardiovascular;  Laterality: N/A;   LAPAROTOMY  01/01/2017   Procedure: EXPLORATORY LAPAROTOMY;  Surgeon: Aloysius Plant, MD;  Location: [facility] ORS;  Service: General;;   PARTIAL COLECTOMY N/A 01/01/2017   Procedure: PARTIAL COLECTOMY;  Surgeon: Aloysius Plant, MD;  Location: [facility] OR[facility];  Service: General;  Laterality: N/A;   PERIPHERAL VASCULAR CATHETERIZATION N/A 12/28/2016   Procedure: Visceral Artery Intervention;  Surgeon: Selinda GORMAN Gu, MD;  Location: [facility];  Service: Cardiovascular;  Laterality: N/A;   SECONDARY CLOSURE OF WOUND N/A 01/08/2017   Procedure: SECONDARY CLOSURE FOR EVISCERATION;  Surgeon: Charlie FORBES Fell, MD;  Location: [facility] OR[facility];  Service: General;  Laterality: N/A;   SIGMOIDECTOMY  2018   TOTAL HIP ARTHROPLASTY Right 04/24/2022   Procedure: TOTAL HIP ARTHROPLASTY ANTERIOR APPROACH;  Surgeon: Kathlynn Sharper, MD;  Location: [facility] OR[facility];  Service: Orthopedics;  Laterality: Right;   TOTAL KNEE ARTHROPLASTY Right 03/24/2014   times 4    Current Outpatient  Medications  Medication Sig Dispense Refill   acetaminophen  (TYLENOL ) 500 MG tablet Take 1,000 mg by mouth every 8 (eight) hours as needed.     albuterol  (VENTOLIN  HFA) 108 (90 Base) MCG/ACT inhaler SMARTSIG:2 inhalation Via Inhaler Every 6 Hours PRN     apixaban  (ELIQUIS ) 5 MG TABS tablet Take 1 tablet (5 mg total) by mouth 2 (two) times daily. 60 tablet 1   ARNUITY ELLIPTA  100 MCG/ACT AEPB Inhale into the lungs.     Cholecalciferol  (VITAMIN D3) 2000 units capsule Take 2,000 Units by mouth daily.     furosemide  (LASIX ) 80 MG tablet Take 40 mg by mouth daily.     hydrOXYzine  (ATARAX /VISTARIL ) 25 MG tablet Take 25 mg by mouth at bedtime as needed (sleep).     losartan  (COZAAR ) 50 MG tablet Take 50 mg by mouth daily.     metFORMIN  (GLUCOPHAGE -XR) 500 MG 24 hr tablet 1,000 mg daily with breakfast.      metoprolol  succinate (TOPROL -XL) 25 MG 24 hr tablet Take 25 mg by mouth daily.     Multiple Vitamins-Minerals (CENTRUM SILVER PO) Take 1 tablet by mouth every morning.     polyethylene glycol (MIRALAX  / GLYCOLAX ) packet Take 17 g by mouth daily. (Patient taking differently: Take 17 g by mouth daily as needed for mild constipation.) 14 each 0   pravastatin  (PRAVACHOL ) 40 MG tablet Take 40 mg by mouth at bedtime.     spironolactone (ALDACTONE) 25 MG tablet Take 25 mg by mouth daily.     vitamin B-12 (CYANOCOBALAMIN ) 1000 MCG  tablet Take 1,000 mcg by mouth daily.     amiodarone  (PACERONE ) 200 MG tablet Take 200 mg by mouth daily. (Patient not taking: Reported on 01/13/2024)     empagliflozin  (JARDIANCE ) 10 MG TABS tablet Take 1 tablet by mouth daily. (Patient not taking: Reported on 01/13/2024)     No current facility-administered medications for this visit.     Allergies:   Azithromycin, Penicillins, Lisinopril, Losartan , and Metoprolol    Social History:  The patient  reports that he quit smoking about 30 years ago. His smoking use included cigarettes. He started smoking about 59 years ago. He has a  7.3 pack-year smoking history. He has been exposed to tobacco smoke. He has never used smokeless tobacco. He reports that he does not drink alcohol  and does not use drugs.   Family History:   family history includes Diabetes in his brother, brother, sister, and sister; Heart attack in his brother; Ovarian cancer in his sister.    Review of Systems: Review of Systems  Constitutional: Negative.   HENT: Negative.    Respiratory: Negative.    Cardiovascular: Negative.   Gastrointestinal: Negative.   Musculoskeletal: Negative.   Neurological: Negative.   Psychiatric/Behavioral: Negative.    All other systems reviewed and are negative.    PHYSICAL EXAM: VS:  BP 100/60 (BP Location: Right Arm, Patient Position: Sitting, Cuff Size: Large)   Pulse 78   Ht 6' 3 (1.905 m)   Wt 268 lb 6 oz (121.7 kg)   SpO2 96%   BMI 33.54 kg/m  , BMI Body mass index is 33.54 kg/m. GEN: Well nourished, well developed, in no acute distress HEENT: normal Neck: no JVD, carotid bruits, or masses Cardiac: RRR; no murmurs, rubs, or gallops,no edema  Respiratory:  clear to auscultation bilaterally, normal work of breathing GI: soft, nontender, nondistended, + BS MS: no deformity or atrophy Skin: warm and dry, no rash Neuro:  Strength and sensation are intact Psych: euthymic mood, full affect   Recent Labs: 04/06/2023: ALT 14 05/19/2023: B Natriuretic Peptide 140.5 05/21/2023: BUN 10; Potassium 3.6; Sodium 134 05/22/2023: Creatinine, Ser 0.88 09/09/2023: Hemoglobin 13.8; Platelet Count 321    Lipid Panel Lab Results  Component Value Date   CHOL 85 08/21/2012   HDL 26 (L) 08/21/2012   LDLCALC 44 08/21/2012   TRIG 74 08/21/2012      Wt Readings from Last 3 Encounters:  01/13/24 268 lb 6 oz (121.7 kg)  10/30/23 278 lb (126.1 kg)  08/01/23 278 lb (126.1 kg)     ASSESSMENT AND PLAN:  Problem List Items Addressed This Visit       Cardiology Problems   Chronic diastolic CHF (congestive heart  failure) (HCC) - Primary (Chronic)   Relevant Medications   spironolactone (ALDACTONE) 25 MG tablet   furosemide  (LASIX ) 80 MG tablet   metoprolol  succinate (TOPROL -XL) 25 MG 24 hr tablet   losartan  (COZAAR ) 50 MG tablet   amiodarone  (PACERONE ) 200 MG tablet   Other Relevant Orders   EKG 12-Lead (Completed)   Essential hypertension   Relevant Medications   spironolactone (ALDACTONE) 25 MG tablet   furosemide  (LASIX ) 80 MG tablet   metoprolol  succinate (TOPROL -XL) 25 MG 24 hr tablet   losartan  (COZAAR ) 50 MG tablet   amiodarone  (PACERONE ) 200 MG tablet   Other Relevant Orders   EKG 12-Lead (Completed)   Aortic atherosclerosis (HCC)   Relevant Medications   spironolactone (ALDACTONE) 25 MG tablet   furosemide  (LASIX ) 80 MG tablet   metoprolol   succinate (TOPROL -XL) 25 MG 24 hr tablet   losartan  (COZAAR ) 50 MG tablet   amiodarone  (PACERONE ) 200 MG tablet   Other Relevant Orders   EKG 12-Lead (Completed)   Other Visit Diagnoses       Atrial fibrillation, unspecified type (HCC)       Relevant Medications   spironolactone (ALDACTONE) 25 MG tablet   furosemide  (LASIX ) 80 MG tablet   metoprolol  succinate (TOPROL -XL) 25 MG 24 hr tablet   losartan  (COZAAR ) 50 MG tablet   amiodarone  (PACERONE ) 200 MG tablet   Other Relevant Orders   EKG 12-Lead (Completed)      Atrial flutter, typical Recent episode December 2024, leading to CHF exacerbation, cardiomyopathy, cardioversion December 10, 2023 Recommend he continue amiodarone  200 mg daily, Eliquis  5 twice daily, metoprolol  succinate 25 daily Recommended close monitoring of pulse at home, does not have a blood pressure cuff, recommended he consider pulse oximeter For recurrent episodes could consider reload with amiodarone   Body habitus may preclude him from ablation  Cardiomyopathy Ejection fraction 25 to 30% in the setting of atrial flutter December 2024 Maintaining normal sinus rhythm Recommend he continue Jardiance  10 mg daily,  new prescription sent in Continue metoprolol  succinate, spironolactone, losartan , Lasix  40 daily We will hold off on transitioning losartan  to Entresto given cost of medication by his report Recommend limited echo in 3 months time  Hyperlipidemia Continue pravastatin   Chronic systolic CHF Weight stable 263 pounds at home, 268 on our scale Recommend he continue Lasix  40 daily CHF management/education initiated    Signed, Velinda Lunger, M.D., Ph.D. [facility] [place], [place] [phone]

## 2024-01-14 ENCOUNTER — Telehealth: Payer: Self-pay | Admitting: Cardiovascular Disease

## 2024-01-14 NOTE — Telephone Encounter (Signed)
-----   Message from Nurse Tarri Abernethy sent at 01/13/2024  5:13 PM EST ----- Can you please schedule this patient for a limited echo in 3 month and then an appointment in clinic following the echo? He can be scheduled with an APP.   Thank you,  Elon Jester, RN

## 2024-01-14 NOTE — Telephone Encounter (Signed)
----- -----   Can you please schedule this patient for a limited echo in 3 month and then an appointment in clinic following the echo? He can be scheduled with an APP.   I called an left a message for the echo and follow up

## 2024-01-23 ENCOUNTER — Other Ambulatory Visit: Payer: Medicare HMO

## 2024-01-28 ENCOUNTER — Ambulatory Visit: Payer: Medicare HMO | Attending: Cardiovascular Disease

## 2024-01-28 DIAGNOSIS — I429 Cardiomyopathy, unspecified: Secondary | ICD-10-CM

## 2024-01-28 LAB — ECHOCARDIOGRAM LIMITED
Area-P 1/2: 3.99 cm2
S' Lateral: 4.4 cm

## 2024-01-28 MED ORDER — PERFLUTREN LIPID MICROSPHERE
1.0000 mL | INTRAVENOUS | Status: AC | PRN
  Administered 2024-01-28: 2 mL via INTRAVENOUS

## 2024-01-30 ENCOUNTER — Ambulatory Visit
Admission: RE | Admit: 2024-01-30 | Discharge: 2024-01-30 | Disposition: A | Discharge status: Home / Self Care | Payer: Medicare HMO | Source: Ambulatory Visit | Attending: Radiation Oncology | Admitting: Radiation Oncology

## 2024-01-30 ENCOUNTER — Encounter: Payer: Self-pay | Admitting: Radiation Oncology

## 2024-01-30 VITALS — BP 124/82 | HR 79 | Temp 98.0°F | Resp 16 | Ht 75.0 in | Wt 263.0 lb

## 2024-01-30 DIAGNOSIS — Z923 Personal history of irradiation: Secondary | ICD-10-CM | POA: Insufficient documentation

## 2024-01-30 DIAGNOSIS — C61 Malignant neoplasm of prostate: Secondary | ICD-10-CM | POA: Diagnosis present

## 2024-01-30 NOTE — Progress Notes (Signed)
Radiation Oncology Follow up Note  Name: Evan [name]   Date:   01/30/2024 MRN:  [id number] DOB: 08/30/1945    This 79 y.o. male presents to the clinic today for 35-month follow-up status post image guided IMRT radiation therapy for Gleason 7 (4+3) adenocarcinoma the prostate.  REFERRING PROVIDER: Mick Sell, MD  HPI: Patient is a 79 year old male now out for months having completed external beam IMRT radiation therapy for Gleason 7 (4+3) adenocarcinoma the prostate.  Seen today in routine follow-up he states he is really not having nocturia although he is on numerous.  Diuretic type medications which causes some frequency of urination during the day.  His bowels are doing well.  His most recent PSA is undetectable at 0.01 showing excellent biochemical response to treatment.  COMPLICATIONS OF TREATMENT: none  FOLLOW UP COMPLIANCE: keeps appointments   PHYSICAL EXAM:  BP 124/82   Pulse 79   Temp 98 F (36.7 C) (Tympanic)   Resp 16   Ht 6\' 3"  (1.905 m)   Wt 263 lb (119.3 kg)   BMI 32.87 kg/m  Well-developed well-nourished patient in NAD. HEENT reveals PERLA, EOMI, discs not visualized.  Oral cavity is clear. No oral mucosal lesions are identified. Neck is clear without evidence of cervical or supraclavicular adenopathy. Lungs are clear to A&P. Cardiac examination is essentially unremarkable with regular rate and rhythm without murmur rub or thrill. Abdomen is benign with no organomegaly or masses noted. Motor sensory and DTR levels are equal and symmetric in the upper and lower extremities. Cranial nerves II through XII are grossly intact. Proprioception is intact. No peripheral adenopathy or edema is identified. No motor or sensory levels are noted. Crude visual fields are within normal range.  RADIOLOGY RESULTS: No current films to review  PLAN: The present time patient is doing well under excellent biochemical control of his prostate cancer.  And pleased with his overall  progress.  Of asked to see him back in 6 months with a follow-up PSA.  He can have that performed through his PMD if that is more convenient.  Patient knows to call with any concerns.  I would like to take this opportunity to thank you for allowing me to participate in the care of your patient.Carmina Miller, MD

## 2024-01-31 ENCOUNTER — Encounter: Payer: Self-pay | Admitting: Emergency Medicine

## 2024-03-07 ENCOUNTER — Other Ambulatory Visit: Payer: Self-pay

## 2024-03-07 ENCOUNTER — Emergency Department
Admission: EM | Admit: 2024-03-07 | Discharge: 2024-03-07 | Disposition: A | Discharge status: Home / Self Care | Attending: Emergency Medicine | Admitting: Emergency Medicine

## 2024-03-07 ENCOUNTER — Emergency Department

## 2024-03-07 DIAGNOSIS — W01198A Fall on same level from slipping, tripping and stumbling with subsequent striking against other object, initial encounter: Secondary | ICD-10-CM | POA: Insufficient documentation

## 2024-03-07 DIAGNOSIS — R0781 Pleurodynia: Secondary | ICD-10-CM | POA: Insufficient documentation

## 2024-03-07 DIAGNOSIS — I509 Heart failure, unspecified: Secondary | ICD-10-CM | POA: Insufficient documentation

## 2024-03-07 DIAGNOSIS — J449 Chronic obstructive pulmonary disease, unspecified: Secondary | ICD-10-CM | POA: Diagnosis not present

## 2024-03-07 DIAGNOSIS — S01511A Laceration without foreign body of lip, initial encounter: Secondary | ICD-10-CM | POA: Insufficient documentation

## 2024-03-07 DIAGNOSIS — Z7901 Long term (current) use of anticoagulants: Secondary | ICD-10-CM | POA: Diagnosis not present

## 2024-03-07 DIAGNOSIS — S0990XA Unspecified injury of head, initial encounter: Secondary | ICD-10-CM | POA: Diagnosis not present

## 2024-03-07 DIAGNOSIS — Y92481 Parking lot as the place of occurrence of the external cause: Secondary | ICD-10-CM | POA: Insufficient documentation

## 2024-03-07 DIAGNOSIS — S025XXA Fracture of tooth (traumatic), initial encounter for closed fracture: Secondary | ICD-10-CM | POA: Insufficient documentation

## 2024-03-07 DIAGNOSIS — W19XXXA Unspecified fall, initial encounter: Secondary | ICD-10-CM

## 2024-03-07 DIAGNOSIS — E119 Type 2 diabetes mellitus without complications: Secondary | ICD-10-CM | POA: Diagnosis not present

## 2024-03-07 DIAGNOSIS — Z86718 Personal history of other venous thrombosis and embolism: Secondary | ICD-10-CM | POA: Diagnosis not present

## 2024-03-07 DIAGNOSIS — I110 Hypertensive heart disease with heart failure: Secondary | ICD-10-CM | POA: Diagnosis not present

## 2024-03-07 DIAGNOSIS — S0993XA Unspecified injury of face, initial encounter: Secondary | ICD-10-CM | POA: Diagnosis present

## 2024-03-07 DIAGNOSIS — S0081XA Abrasion of other part of head, initial encounter: Secondary | ICD-10-CM

## 2024-03-07 MED ORDER — BACITRACIN ZINC 500 UNIT/GM EX OINT
TOPICAL_OINTMENT | Freq: Once | CUTANEOUS | Status: AC
  Filled 2024-03-07: qty 3.6

## 2024-03-07 MED ORDER — LIDOCAINE HCL (PF) 1 % IJ SOLN
5.0000 mL | Freq: Once | INTRAMUSCULAR | Status: AC
  Administered 2024-03-07: 5 mL
  Filled 2024-03-07: qty 5

## 2024-03-07 NOTE — Discharge Instructions (Signed)
 Your exam, CT scans, and x-rays are all normal and reassuring following your fall.  You do appear to have a dental injury related to your fall.  You also have a lip laceration which has been repaired with absorbable sutures.  Keep the wound clean, dry.  Apply antibiotic ointment to the abrasions.  See your primary provider as needed, and your dental provider for tooth repair.

## 2024-03-07 NOTE — ED Provider Notes (Signed)
 [facility] Emergency Department Provider Note     Event Date/Time   First MD Initiated Contact with Patient 03/07/24 1204     (approximate)   History   Fall   HPI  Evan [name] is a 79 y.o. male with a history of CHF, HLD, diabetes, HTN, COPD, and DVT on Eliquis, presents to the ED following a mechanical fall.  By his report, he tripped over the speed bump in the parking lot of a local warehouse.  He fell landing hitting his face.  He denies any LOC or weakness.  He presents with abrasions to the face, as well as some pain to the right mid thoracic rib cage.  He denies any frank nosebleed, but does endorse a lower lip laceration.  Patient is unaware, but it appears his upper primary incisors may have been injured during the fall.  He denies any difficulty closing his jaw.  He denies any preceding weakness, dizziness, or paralysis.  EMS to the scene but patient declined transport at this time.  He presents himself to the ED for evaluation of injuries.   Physical Exam   Triage Vital Signs: ED Triage Vitals  Encounter Vitals Group     BP 03/07/24 1120 (!) 148/78     Systolic BP Percentile --      Diastolic BP Percentile --      Pulse Rate 03/07/24 1120 85     Resp 03/07/24 1120 17     Temp 03/07/24 1120 98.3 F (36.8 C)     Temp Source 03/07/24 1120 Oral     SpO2 03/07/24 1120 99 %     Weight 03/07/24 1121 261 lb 3.2 oz (118.5 kg)     Height 03/07/24 1121 6\' 3"  (1.905 m)     Head Circumference --      Peak Flow --      Pain Score 03/07/24 1121 2     Pain Loc --      Pain Education --      Exclude from Growth Chart --     Most recent vital signs: Vitals:   03/07/24 1120  BP: (!) 148/78  Pulse: 85  Resp: 17  Temp: 98.3 F (36.8 C)  SpO2: 99%    General Awake, no distress. NAD A&O x 4 HEENT NCAT.  Abrasions noted across the central nose, upper lip, and a laceration to the lower lip is noted.  Vermilion border is spared.  Appearance of  distal irregularity of the primary incisors, concerning for acute dental injury.  No subluxation is noted.  PERRL. EOMI. No rhinorrhea. Mucous membranes are moist.  CV:  Good peripheral perfusion. RRR RESP:  Normal effort. CTA ABD:  No distention.  MSK:  Active range of motion of all extremities. NEURO: Cranial nerves II to XII grossly intact.   ED Results / Procedures / Treatments   Labs (all labs ordered are listed, but only abnormal results are displayed) Labs Reviewed - No data to display   EKG   RADIOLOGY  I personally viewed and evaluated these images as part of my medical decision making, as well as reviewing the written report by the radiologist.  ED Provider Interpretation: No acute findings  CT HEAD WO CONTRAST ( ) Result Date: 03/07/2024 CLINICAL DATA:  Head and neck trauma, on anticoagulation EXAM: CT HEAD WITHOUT CONTRAST CT MAXILLOFACIAL WITHOUT CONTRAST CT CERVICAL SPINE WITHOUT CONTRAST TECHNIQUE: Multidetector CT imaging of the head, cervical spine, and maxillofacial structures were performed  using the standard protocol without intravenous contrast. Multiplanar CT image reconstructions of the cervical spine and maxillofacial structures were also generated. RADIATION DOSE REDUCTION: This exam was performed according to the departmental dose-optimization program which includes automated exposure control, adjustment of the mA and/or kV according to patient size and/or use of iterative reconstruction technique. COMPARISON:  None Available. FINDINGS: CT HEAD FINDINGS Brain: No evidence of acute infarction, hemorrhage, hydrocephalus, extra-axial collection or mass lesion/mass effect. Vascular: No hyperdense vessel or unexpected calcification. CT FACIAL BONES FINDINGS Skull: Normal. Negative for fracture or focal lesion. Facial bones: No displaced fractures or dislocations. Sinuses/Orbits: No acute finding. Other: Soft tissue laceration of the lips and nose. CT CERVICAL SPINE  FINDINGS Alignment: Normal. Skull base and vertebrae: No acute fracture. No primary bone lesion or focal pathologic process. Soft tissues and spinal canal: No prevertebral fluid or swelling. No visible canal hematoma. Disc levels: Moderate to severe disc degenerative disease and osteophytosis, worst at C5-C7. Upper chest: Negative. Other: None. IMPRESSION: 1. No acute intracranial pathology. 2. No displaced fractures or dislocations of the facial bones. 3. Soft tissue laceration of the lips and nose. 4. No fracture or static subluxation of the cervical spine. 5. Moderate to severe disc degenerative disease and osteophytosis, worst at C5-C7. Electronically Signed   By: Jearld Lesch M.D.   On: 03/07/2024 13:27   CT Maxillofacial Wo Contrast Result Date: 03/07/2024 CLINICAL DATA:  Head and neck trauma, on anticoagulation EXAM: CT HEAD WITHOUT CONTRAST CT MAXILLOFACIAL WITHOUT CONTRAST CT CERVICAL SPINE WITHOUT CONTRAST TECHNIQUE: Multidetector CT imaging of the head, cervical spine, and maxillofacial structures were performed using the standard protocol without intravenous contrast. Multiplanar CT image reconstructions of the cervical spine and maxillofacial structures were also generated. RADIATION DOSE REDUCTION: This exam was performed according to the departmental dose-optimization program which includes automated exposure control, adjustment of the mA and/or kV according to patient size and/or use of iterative reconstruction technique. COMPARISON:  None Available. FINDINGS: CT HEAD FINDINGS Brain: No evidence of acute infarction, hemorrhage, hydrocephalus, extra-axial collection or mass lesion/mass effect. Vascular: No hyperdense vessel or unexpected calcification. CT FACIAL BONES FINDINGS Skull: Normal. Negative for fracture or focal lesion. Facial bones: No displaced fractures or dislocations. Sinuses/Orbits: No acute finding. Other: Soft tissue laceration of the lips and nose. CT CERVICAL SPINE FINDINGS  Alignment: Normal. Skull base and vertebrae: No acute fracture. No primary bone lesion or focal pathologic process. Soft tissues and spinal canal: No prevertebral fluid or swelling. No visible canal hematoma. Disc levels: Moderate to severe disc degenerative disease and osteophytosis, worst at C5-C7. Upper chest: Negative. Other: None. IMPRESSION: 1. No acute intracranial pathology. 2. No displaced fractures or dislocations of the facial bones. 3. Soft tissue laceration of the lips and nose. 4. No fracture or static subluxation of the cervical spine. 5. Moderate to severe disc degenerative disease and osteophytosis, worst at C5-C7. Electronically Signed   By: Jearld Lesch M.D.   On: 03/07/2024 13:27   CT Cervical Spine Wo Contrast Result Date: 03/07/2024 CLINICAL DATA:  Head and neck trauma, on anticoagulation EXAM: CT HEAD WITHOUT CONTRAST CT MAXILLOFACIAL WITHOUT CONTRAST CT CERVICAL SPINE WITHOUT CONTRAST TECHNIQUE: Multidetector CT imaging of the head, cervical spine, and maxillofacial structures were performed using the standard protocol without intravenous contrast. Multiplanar CT image reconstructions of the cervical spine and maxillofacial structures were also generated. RADIATION DOSE REDUCTION: This exam was performed according to the departmental dose-optimization program which includes automated exposure control, adjustment of the mA and/or kV  according to patient size and/or use of iterative reconstruction technique. COMPARISON:  None Available. FINDINGS: CT HEAD FINDINGS Brain: No evidence of acute infarction, hemorrhage, hydrocephalus, extra-axial collection or mass lesion/mass effect. Vascular: No hyperdense vessel or unexpected calcification. CT FACIAL BONES FINDINGS Skull: Normal. Negative for fracture or focal lesion. Facial bones: No displaced fractures or dislocations. Sinuses/Orbits: No acute finding. Other: Soft tissue laceration of the lips and nose. CT CERVICAL SPINE FINDINGS Alignment:  Normal. Skull base and vertebrae: No acute fracture. No primary bone lesion or focal pathologic process. Soft tissues and spinal canal: No prevertebral fluid or swelling. No visible canal hematoma. Disc levels: Moderate to severe disc degenerative disease and osteophytosis, worst at C5-C7. Upper chest: Negative. Other: None. IMPRESSION: 1. No acute intracranial pathology. 2. No displaced fractures or dislocations of the facial bones. 3. Soft tissue laceration of the lips and nose. 4. No fracture or static subluxation of the cervical spine. 5. Moderate to severe disc degenerative disease and osteophytosis, worst at C5-C7. Electronically Signed   By: Jearld Lesch M.D.   On: 03/07/2024 13:27   DG Ribs Unilateral W/Chest Right Result Date: 03/07/2024 CLINICAL DATA:  Fall this morning. Blunt trauma with right chest and rib pain. EXAM: RIGHT RIBS AND CHEST - 3+ VIEW COMPARISON:  Chest radiograph on 05/19/2023 FINDINGS: No fracture or other bone lesions are seen involving the ribs. There is no evidence of pneumothorax or pleural effusion. Both lungs are clear. Heart size and mediastinal contours are within normal limits. IMPRESSION: Negative. Electronically Signed   By: Danae Orleans M.D.   On: 03/07/2024 13:07     PROCEDURES:  Critical Care performed: No  .Laceration Repair  Date/Time: 03/07/2024 1:16 PM  Performed by: Lissa Hoard, PA-C Authorized by: Lissa Hoard, PA-C   Consent:    Consent obtained:  Verbal   Consent given by:  Patient   Risks discussed:  Pain and poor wound healing   Alternatives discussed:  No treatment Universal protocol:    Site/side marked: yes     Patient identity confirmed:  Verbally with patient Anesthesia:    Anesthesia method:  Local infiltration   Local anesthetic:  Lidocaine 1% w/o epi Laceration details:    Location:  Lip   Lip location:  Lower exterior lip   Length (cm):  2   Depth (mm):  5 Pre-procedure details:    Preparation:   Patient was prepped and draped in usual sterile fashion Exploration:    Limited defect created (wound extended): no     Contaminated: no   Treatment:    Area cleansed with:  Saline and povidone-iodine   Amount of cleaning:  Standard   Irrigation solution:  Sterile saline   Irrigation method:  Tap   Debridement:  None   Undermining:  None   Scar revision: no   Skin repair:    Repair method:  Sutures   Suture size:  4-0   Suture material:  Fast-absorbing gut   Suture technique:  Simple interrupted   Number of sutures:  5 ((including 2 external)) Approximation:    Approximation:  Close   Vermilion border well-aligned: yes   Repair type:    Repair type:  Simple Post-procedure details:    Dressing:  Open (no dressing)   Procedure completion:  Tolerated well, no immediate complications    MEDICATIONS ORDERED IN ED: Medications  lidocaine (PF) (XYLOCAINE) 1 % injection 5 mL (5 mLs Infiltration Given by Other 03/07/24 1644)  bacitracin ointment (  Topical Given 03/07/24 1644)     IMPRESSION / MDM / ASSESSMENT AND PLAN / ED COURSE  I reviewed the triage vital signs and the nursing notes.                              Differential diagnosis includes, but is not limited to, SDH, closed head injury, facial fracture, nasal fracture, cervical strain, cervical fracture, cervical radiculopathy, abrasions, lacerations, contusion, dental injury  Patient's presentation is most consistent with acute presentation with potential threat to life or bodily function.  Patient's diagnosis is consistent with mechanical fall resulting in facial abrasions and lower lip laceration.  Patient also sustained injury to the upper primary incisors.  CT images reviewed by me are negative and reassuring at this time for any acute findings.  Patient's abrasions were cleansed and antibiotic vomitus applied.  The lower lip laceration which evidence some dehiscence with engagement the lower lip, required suture repair.   Several 4-0 absorbable sutures were placed in the subcu for approximation and to dermal 4-0 sutures were placed topically.  Patient will be discharged home with instructions to take home meds including OTC Tylenol as needed. Patient is to follow up with PCP for wound check in the interim as discussed, as needed or otherwise directed. Patient is given ED precautions to return to the ED for any worsening or new symptoms.  FINAL CLINICAL IMPRESSION(S) / ED DIAGNOSES   Final diagnoses:  Lip laceration, initial encounter  Closed fracture of tooth, initial encounter  Facial abrasion, initial encounter  Fall, initial encounter     Rx / DC Orders   ED Discharge Orders     None        Note:  This document was prepared using Dragon voice recognition software and may include unintentional dictation errors.    Lissa Hoard, PA-C 03/07/24 2020    Phineas Semen, MD 03/08/24 1537

## 2024-03-07 NOTE — ED Triage Notes (Signed)
 Pt has multiple facial abrasions following a fall. Pt states he tripped over a speedbump at [employer]. States he does have pain at the right ribcage area - rates it 2/10, states it's a soreness.

## 2024-03-07 NOTE — ED Notes (Signed)
 First nurse note:   To ED from [facility] to have laceration on lips sutured. Pt had mechanical fall this AM and hit face. No LOC, is on thinners. Bldg is controlled.

## 2024-04-06 ENCOUNTER — Ambulatory Visit: Payer: Medicare HMO | Admitting: Cardiovascular Disease

## 2024-04-14 ENCOUNTER — Ambulatory Visit: Payer: Medicare HMO | Attending: Physician Assistant | Admitting: Physician Assistant

## 2024-04-14 ENCOUNTER — Encounter: Payer: Self-pay | Admitting: Physician Assistant

## 2024-04-14 VITALS — BP 98/56 | HR 86 | Ht 76.0 in | Wt 266.8 lb

## 2024-04-14 DIAGNOSIS — I447 Left bundle-branch block, unspecified: Secondary | ICD-10-CM | POA: Diagnosis not present

## 2024-04-14 DIAGNOSIS — I4819 Other persistent atrial fibrillation: Secondary | ICD-10-CM

## 2024-04-14 DIAGNOSIS — I5020 Unspecified systolic (congestive) heart failure: Secondary | ICD-10-CM | POA: Diagnosis not present

## 2024-04-14 DIAGNOSIS — Z79899 Other long term (current) drug therapy: Secondary | ICD-10-CM

## 2024-04-14 DIAGNOSIS — E875 Hyperkalemia: Secondary | ICD-10-CM

## 2024-04-14 DIAGNOSIS — E785 Hyperlipidemia, unspecified: Secondary | ICD-10-CM

## 2024-04-14 DIAGNOSIS — I10 Essential (primary) hypertension: Secondary | ICD-10-CM

## 2024-04-14 DIAGNOSIS — I4892 Unspecified atrial flutter: Secondary | ICD-10-CM | POA: Diagnosis not present

## 2024-04-14 MED ORDER — LOSARTAN POTASSIUM 25 MG PO TABS: 25.0000 mg | ORAL_TABLET | Freq: Every day | ORAL | 3 refills | Status: DC

## 2024-04-14 MED ORDER — METOPROLOL SUCCINATE ER 25 MG PO TB24: 12.5000 mg | ORAL_TABLET | Freq: Every day | ORAL | 3 refills | Status: DC

## 2024-04-14 NOTE — H&P (View-Only) (Signed)
 Cardiology Office Note    Date:  04/14/2024   ID:  Izrael, Peak 12-04-1945, MRN [id number]  PCP:  Eartha Gold, MD  Cardiologist:  Belva Boyden, MD  Electrophysiologist:  None   Chief Complaint: Follow-up  History of Present Illness:   Evan [name] is a 79 y.o. male with history of HFrEF diagnosed in 12/2023, persistent A-fib status post ablation in 2010, atrial flutter status post cardioversion in 12/2023, DVT/PE in 05/2023, GI bleed, DM2, HTN, HLD, prostate cancer status post radiation, LBBB, prior tobacco use quitting in 1995, and OSA on CPAP who presents for follow-up of cardiomyopathy and A-fib/flutter.  He was admitted to [facility] in 12/2023 with A-fib/flutter and new onset HFrEF.  He was diuresed and placed on amiodarone.  He underwent successful cardioversion during admission.  Echo showed an EF of 25 to 30% with normal RV cavity size with mildly reduced RVSF with recommendation for escalation of GDMT.  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  He established care with Dr. Gollan in 01/2024.  Echo in 01/2024 showed an EF of 35 to 40%, global hypokinesis, grade 1 diastolic dysfunction, normal RV systolic function and ventricular cavity size, mild calcification of the aortic valve, and an estimated right atrial pressure of 3 mmHg.  He was seen in 02/2024 after sustaining a mechanical fall and was prescribed tramadol for back pain.  He was seen in the ER in 02/2024 dizziness, lightheadedness, back pain, and associated shortness of breath.  High-sensitivity troponin negative x 2.  ProBNP 1303.  Chest x-ray with mildly prominent interstitial markings.  He comes in today noting continued shortness of breath that has been present since his hospital admission in 12/2023.  He notes following DCCV his functional status did improve, though his shortness of breath has persisted.  He is without symptoms of frank chest pain.  No lower extremity swelling or progressive orthopnea.   Weight remains stable by home scale ranging largely from 258 to 260 pounds.  Adherent and tolerating cardiac medications.  No further falls.  No symptoms of hematochezia or melena.  No dizziness, presyncope, or syncope.  Remains active at baseline, riding a stationary bike at the gym several days per week without cardiac limitation.   Labs independently reviewed: 02/2024 - Hgb 14.4, PLT 352, potassium 5.1, BUN 22, serum creatinine 1.18, albumin 3.6, AST/ALT normal, magnesium 2.3 01/2024 - A1c 7, TC 146, TG 45, HDL 65, LDL 72 12/2023 - TSH normal  Past Medical History:  Diagnosis Date   Adenomatous polyp of colon    Arthritis    xDJD   Atrial fibrillation (HCC)    BPH (benign prostatic hyperplasia)    CHF (congestive heart failure) (HCC)    COPD (chronic obstructive pulmonary disease) (HCC)    Diabetes mellitus without complication (HCC)    type 2   Discitis    Diverticulitis    Diverticulosis    DVT (deep venous thrombosis) (HCC)    bilateral tibial   Erectile dysfunction    Hearing loss    Hyperlipidemia    Hypertension    Ischemic optic neuropathy    Lower GI bleeding    Lower GI bleeding 02/23/2018   Lumbar degenerative disc disease    Non-ischemic cardiomyopathy (HCC)    Sleep apnea     Past Surgical History:  Procedure Laterality Date   ATRIAL ABLATION SURGERY  2010   CATARACT EXTRACTION W/PHACO Right 10/10/2020   Procedure: CATARACT EXTRACTION PHACO AND INTRAOCULAR LENS PLACEMENT (  IOC) RIGHT ISTENT INJ DIABETIC 2.64  00:29.4;  Surgeon: Rosa College, MD;  Location: [facility];  Service: Ophthalmology;  Laterality: Right;  Diabetic - oral meds   CATARACT EXTRACTION W/PHACO Left 03/06/2021   Procedure: CATARACT EXTRACTION PHACO AND INTRAOCULAR LENS PLACEMENT (IOC) LEFT DIABETIC ISTENT INJ 3.62 00:30.6;  Surgeon: Rosa College, MD;  Location: [facility];  Service: Ophthalmology;  Laterality: Left;  Diabetic - oral meds   CHOLECYSTECTOMY  2011    COLONOSCOPY Left 02/24/2018   Procedure: COLONOSCOPY;  Surgeon: Irby Mannan, MD;  Location: [facility] END[facility];  Service: Endoscopy;  Laterality: Left;   COLONOSCOPY     2008, 2010, 2013   COLONOSCOPY N/A 04/06/2023   Procedure: COLONOSCOPY;  Surgeon: Toledo, Alphonsus Jeans, MD;  Location: [facility] ENDOSCOPY;  Service: Gastroenterology;  Laterality: N/A;   COLONOSCOPY WITH PROPOFOL N/A 09/25/2017   Procedure: COLONOSCOPY WITH PROPOFOL;  Surgeon: Cassie Click, MD;  Location: [facility] END[facility]COPY;  Service: Endoscopy;  Laterality: N/A;   COLONOSCOPY WITH PROPOFOL N/A 02/18/2018   Procedure: COLONOSCOPY WITH PROPOFOL;  Surgeon: Marnee Sink, MD;  Location: [facility] ENDOSCOPY;  Service: Endoscopy;  Laterality: N/A;   ERCP  2011   ESOPHAGOGASTRODUODENOSCOPY Left 02/23/2018   Procedure: ESOPHAGOGASTRODUODENOSCOPY (EGD);  Surgeon: Irby Mannan, MD;  Location: [facility] ENDOSCOPY;  Service: Endoscopy;  Laterality: Left;   ESOPHAGOGASTRODUODENOSCOPY  2014   IVC FILTER INSERTION N/A 04/17/2022   Procedure: IVC FILTER INSERTION;  Surgeon: Jackquelyn Mass, MD;  Location: [facility] IN[facility];  Service: Cardiovascular;  Laterality: N/A;   IVC FILTER REMOVAL N/A 07/31/2022   Procedure: IVC FILTER REMOVAL;  Surgeon: Jackquelyn Mass, MD;  Location: [facility];  Service: Cardiovascular;  Laterality: N/A;   LAPAROTOMY  01/01/2017   Procedure: EXPLORATORY LAPAROTOMY;  Surgeon: Emmalene Hare, MD;  Location: [facility] ORS;  Service: General;;   PARTIAL COLECTOMY N/A 01/01/2017   Procedure: PARTIAL COLECTOMY;  Surgeon: Emmalene Hare, MD;  Location: [facility];  Service: General;  Laterality: N/A;   PERIPHERAL VASCULAR CATHETERIZATION N/A 12/28/2016   Procedure: Visceral Artery Intervention;  Surgeon: Celso College, MD;  Location: [facility];  Service: Cardiovascular;  Laterality: N/A;   SECONDARY CLOSURE OF WOUND N/A 01/08/2017   Procedure: SECONDARY CLOSURE FOR EVISCERATION;  Surgeon: Claudia Cuff,  MD;  Location: [facility] OR[facility];  Service: General;  Laterality: N/A;   SIGMOIDECTOMY  2018   TOTAL HIP ARTHROPLASTY Right 04/24/2022   Procedure: TOTAL HIP ARTHROPLASTY ANTERIOR APPROACH;  Surgeon: Molli Angelucci, MD;  Location: [facility] ORS;  Service: Orthopedics;  Laterality: Right;   TOTAL KNEE ARTHROPLASTY Right 03/24/2014   times 4    Current Medications: Current Meds  Medication Sig   acetaminophen (TYLENOL) 500 MG tablet Take 1,000 mg by mouth every 8 (eight) hours as needed.   albuterol (VENTOLIN HFA) 108 (90 Base) MCG/ACT inhaler SMARTSIG:2 inhalation Via Inhaler Every 6 Hours PRN   amiodarone (PACERONE) 200 MG tablet Take 1 tablet (200 mg total) by mouth daily.   apixaban (ELIQUIS) 5 MG TABS tablet Take 1 tablet (5 mg total) by mouth 2 (two) times daily.   ARNUITY ELLIPTA 100 MCG/ACT AEPB Inhale into the lungs.   Cholecalciferol (VITAMIN D3) 2000 units capsule Take 2,000 Units by mouth daily.   empagliflozin (JARDIANCE) 10 MG TABS tablet Take 1 tablet (10 mg total) by mouth daily.   furosemide (LASIX) 40 MG tablet Take 1 tablet (40 mg total) by mouth daily.   hydrOXYzine (ATARAX/VISTARIL) 25 MG  tablet Take 25 mg by mouth at bedtime as needed (sleep).   JARDIANCE 25 MG TABS tablet Take 25 mg by mouth daily.   metFORMIN (GLUCOPHAGE-XR) 500 MG 24 hr tablet 1,000 mg daily with breakfast.    Multiple Vitamins-Minerals (CENTRUM SILVER PO) Take 1 tablet by mouth every morning.   polyethylene glycol (MIRALAX / GLYCOLAX) packet Take 17 g by mouth daily. (Patient taking differently: Take 17 g by mouth daily as needed for mild constipation.)   pravastatin (PRAVACHOL) 40 MG tablet Take 40 mg by mouth at bedtime.   spironolactone (ALDACTONE) 25 MG tablet Take 25 mg by mouth daily.   tamsulosin (FLOMAX) 0.4 MG CAPS capsule Take by mouth.   vitamin B-12 (CYANOCOBALAMIN) 1000 MCG tablet Take 1,000 mcg by mouth daily.   [DISCONTINUED] losartan (COZAAR) 50 MG tablet Take 50 mg by mouth daily.    [DISCONTINUED] metoprolol succinate (TOPROL-XL) 25 MG 24 hr tablet Take 25 mg by mouth daily.    Allergies:   Azithromycin, Penicillins, Lisinopril, Losartan, and Metoprolol   Social History   Socioeconomic History   Marital status: Married    Spouse name: Gonzalo Lather   Number of children: 1   Years of education: Not on file   Highest education level: Not on file  Occupational History   Not on file  Tobacco Use   Smoking status: Former    Current packs/day: 0.00    Average packs/day: 0.3 packs/day for 29.0 years (7.3 ttl pk-yrs)    Types: Cigarettes    Start date: 12/31/1964    Quit date: 12/31/1993    Years since quitting: 30.3    Passive exposure: Past   Smokeless tobacco: Never  Vaping Use   Vaping status: Never Used  Substance and Sexual Activity   Alcohol use: No    Alcohol/week: 0.0 standard drinks of alcohol   Drug use: No   Sexual activity: Not on file  Other Topics Concern   Not on file  Social History Narrative   Not on file   Social Drivers of Health   Financial Resource Strain: Low Risk  (02/03/2024)   Received from [facility]   Overall Financial Resource Strain (CARDIA)    Difficulty of Paying Living Expenses: Not hard at all  Food Insecurity: No Food Insecurity (02/03/2024)   [facility]   Hunger Vital Sign    Worried About Running Out of Food in the Last Year: Never true    Ran Out of Food in the Last Year: Never true  Transportation Needs: No Transportation Needs (02/03/2024)   Received [facility] - Transportation    In the past 12 months, has lack of transportation kept you from medical appointments or from getting medications?: No    Lack of Transportation (Non-Medical): No  Physical Activity: Not on file  Stress: Not on file  Social Connections: Not on file     Family History:  The patient's family history includes Diabetes in his brother, brother, sister, and sister;  Heart attack in his brother; Ovarian cancer in his sister.  ROS:   12-point review of systems is negative unless otherwise noted in the HPI.   EKGs/Labs/Other Studies Reviewed:    Studies reviewed were summarized above. The additional studies were reviewed today:  2D echo 01/28/2024: 1. Left ventricular ejection fraction, by estimation, is 35 to 40%. The  left ventricle has moderately decreased function. The left ventricle  demonstrates global hypokinesis. The  left ventricular internal cavity size  was mildly dilated. Left ventricular  diastolic parameters are consistent with Grade I diastolic dysfunction  (impaired relaxation).   2. Right ventricular systolic function is normal. The right ventricular  size is normal.   3. The mitral valve is normal in structure. No evidence of mitral valve  regurgitation. No evidence of mitral stenosis.   4. The aortic valve is tricuspid. There is mild calcification of the  aortic valve. Aortic valve regurgitation is not visualized. No aortic  stenosis is present.   5. The inferior vena cava is normal in size with greater than 50%  respiratory variability, suggesting right atrial pressure of 3 mmHg.  __________  2D echo 12/06/2023 [facility]): Summary   1. Technically difficult study.    2. The left ventricle is mildly dilated in size with mildly increased wall  thickness.   3. The left ventricular systolic function is severely decreased, LVEF is  visually estimated at 25-30%.    4. There is mild mitral valve regurgitation.    5. The aortic valve is probably trileaflet with mildly thickened leaflets  with mildly reduced excursion.    6. The right ventricle is normal in size, with mildly reduced systolic  function.   7. IVC size and inspiratory change suggest elevated right atrial pressure.  (10-20 mmHg).  __________  2D echo 05/20/2023: 1. Left ventricular ejection fraction, by estimation, is 50 to 55%. The  left ventricle has low normal  function. Left ventricular endocardial  border not optimally defined to evaluate regional wall motion. The left  ventricular internal cavity size was  mildly dilated. There is mild left ventricular hypertrophy. Left  ventricular diastolic parameters are indeterminate.   2. Right ventricular systolic function is normal. The right ventricular  size is normal. Tricuspid regurgitation signal is inadequate for assessing  PA pressure.   3. Left atrial size was mildly dilated.   4. Right atrial size was mildly dilated.   5. The mitral valve is normal in structure. No evidence of mitral valve  regurgitation. No evidence of mitral stenosis.   6. The aortic valve is normal in structure. Aortic valve regurgitation is  not visualized. Aortic valve sclerosis/calcification is present, without  any evidence of aortic stenosis.   7. The inferior vena cava is normal in size with greater than 50%  respiratory variability, suggesting right atrial pressure of 3 mmHg.  __________  2D echo 02/24/2018: - Left ventricle: The cavity size was normal. Systolic function was    normal. The estimated ejection fraction was in the range of 55%    to 60%.  - Aortic valve: There was trivial regurgitation. Valve area (VTI):    2.13 cm^2. Valve area (Vmax): 2.61 cm^2. Valve area (Vmean): 2.22    cm^2.  - Mitral valve: Valve area by continuity equation (using LVOT    flow): 2.97 cm^2.  __________  2D echo 11/18/2015 [facility]): INTERPRETATION  NORMAL LEFT VENTRICULAR SYSTOLIC FUNCTION  NORMAL RIGHT VENTRICULAR SYSTOLIC FUNCTION  TRIVIAL REGURGITATION NOTED (See above)  NO VALVULAR STENOSIS  Closest EF: >55% (Estimated)  Mitral: TRIVIAL MR  Tricuspid: TRIVIAL TR  LVH: MILD LVH   EKG:  EKG is ordered today.  The EKG ordered today demonstrates NSR, 83 bpm, first-degree AV block, LBBB  Recent Labs: 05/19/2023: B Natriuretic Peptide 140.5 05/21/2023: BUN 10; Potassium 3.6; Sodium 134 05/22/2023: Creatinine, Ser  0.88 09/09/2023: Hemoglobin 13.8; Platelet Count 321  Recent Lipid Panel    Component Value Date/Time   CHOL  85 08/21/2012 0502   TRIG 74 08/21/2012 0502   HDL 26 (L) 08/21/2012 0502   VLDL 15 08/21/2012 0502   LDLCALC 44 08/21/2012 0502    PHYSICAL EXAM:    VS:  BP (!) 98/56   Pulse 86   Ht 6\' 4"  (1.93 m)   Wt 266 lb 12.8 oz (121 kg)   SpO2 98%   BMI 32.48 kg/m   BMI: Body mass index is 32.48 kg/m.  Physical Exam  Wt Readings from Last 3 Encounters:  04/14/24 266 lb 12.8 oz (121 kg)  03/07/24 261 lb 3.2 oz (118.5 kg)  01/30/24 263 lb (119.3 kg)     ASSESSMENT & PLAN:    HFrEF with LBBB and exertional dyspnea: He appears euvolemic and well compensated.  He was initially diagnosed with cardiomyopathy with an EF of 25 to 30% by echo in 12/2023 while in atrial flutter.  It also appears his left bundle branch block first appeared at the onset of his cardiomyopathy.  Repeat echo in 01/2024, following restoration of sinus rhythm and on GDMT showed a slight improvement in EF of 35 to 40% with persistent LBBB.  With relative hypotension we will need to de-escalate GDMT with losartan being decreased to 25 mg and Toprol-XL to 12.5 mg.  For now, he remains on spironolactone 25 mg pending BMP, Jardiance 25 mg, and furosemide to 40 mg daily.  Hypotension precludes escalation of GDMT.  Given he continues to have exertional dyspnea and in the context of persistent cardiomyopathy with newly diagnosed LBBB in 12/2023, we will pursue R/LHC.  He will need to hold apixaban for 48 hours prior to cardiac cath, as well as metformin.  We will repeat a limited echo to trend cardiomyopathy as well.  Further recommendations pending cardiac cath.  Persistent A-fib/flutter: Status post A-fib ablation in 2010 and cardioversion for atrial flutter in 12/2023.  Maintaining sinus rhythm on Toprol-XL which will be reduced to 12.5 mg given relative hypotension to 200 mg daily.  CHA2DS2-VASc at least 6 (CHF, HTN, age x  2, DM, vascular disease).  Remains on apixaban 5 mg twice daily and does not meet reduced dosing criteria.  Check CBC, CMP, and TSH.  Should he have recurrent atrial arrhythmia, would recommend referral to EP.  HTN: Blood pressure soft at 98/56, similar to the reading at visit in 01/2024.  Reduce losartan to 25 mg daily and Toprol-XL to 12.5 mg daily.  He otherwise remains on spironolactone 25 mg daily for now pending updated labs as outlined below.  HLD: LDL 72 in 01/2024.  Remains on pravastatin 40 mg.  Pending cardiac cath findings, may need to escalate to higher intensity statin.  Hyperkalemia: Potassium 5.1 noted at ED visit in 02/2024.  Remains on spironolactone 25 mg.  Check BMP.   Informed Consent   Shared Decision Making/Informed Consent{  The risks [stroke (1 in 1000), death (1 in 1000), kidney failure [usually temporary] (1 in 500), bleeding (1 in 200), allergic reaction [possibly serious] (1 in 200)], benefits (diagnostic support and management of coronary artery disease) and alternatives of a cardiac catheterization were discussed in detail with Mr. Blanchard and he is willing to proceed.        Disposition: F/u with Dr. Gollan or an APP in 3 to 4 weeks.   Medication Adjustments/Labs and Tests Ordered: Current medicines are reviewed at length with the patient today.  Concerns regarding medicines are outlined above. Medication changes, Labs and Tests ordered today are summarized above  and listed in the Patient Instructions accessible in Encounters.   Signed, Varney Gentleman, PA-C 04/14/2024 1:25 PM     [facility] [address] [place], [place] [address] [phone]

## 2024-04-14 NOTE — Progress Notes (Signed)
 Cardiology Office Note    Date:  04/14/2024   ID:  Evan [name], Evan [name] 12-04-1945, MRN [id number]  PCP:  Eartha Gold, MD  Cardiologist:  Belva Boyden, MD  Electrophysiologist:  None   Chief Complaint: Follow-up  History of Present Illness:   Evan [name] is a 79 y.o. male with history of HFrEF diagnosed in 12/2023, persistent A-fib status post ablation in 2010, atrial flutter status post cardioversion in 12/2023, DVT/PE in 05/2023, GI bleed, DM2, HTN, HLD, prostate cancer status post radiation, LBBB, prior tobacco use quitting in 1995, and OSA on CPAP who presents for follow-up of cardiomyopathy and A-fib/flutter.  He was admitted to [facility] in 12/2023 with A-fib/flutter and new onset HFrEF.  He was diuresed and placed on amiodarone.  He underwent successful cardioversion during admission.  Echo showed an EF of 25 to 30% with normal RV cavity size with mildly reduced RVSF with recommendation for escalation of GDMT.  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  He established care with Dr. Gollan in 01/2024.  Echo in 01/2024 showed an EF of 35 to 40%, global hypokinesis, grade 1 diastolic dysfunction, normal RV systolic function and ventricular cavity size, mild calcification of the aortic valve, and an estimated right atrial pressure of 3 mmHg.  He was seen in 02/2024 after sustaining a mechanical fall and was prescribed tramadol for back pain.  He was seen in the ER in 02/2024 dizziness, lightheadedness, back pain, and associated shortness of breath.  High-sensitivity troponin negative x 2.  ProBNP 1303.  Chest x-ray with mildly prominent interstitial markings.  He comes in today noting continued shortness of breath that has been present since his hospital admission in 12/2023.  He notes following DCCV his functional status did improve, though his shortness of breath has persisted.  He is without symptoms of frank chest pain.  No lower extremity swelling or progressive orthopnea.   Weight remains stable by home scale ranging largely from 258 to 260 pounds.  Adherent and tolerating cardiac medications.  No further falls.  No symptoms of hematochezia or melena.  No dizziness, presyncope, or syncope.  Remains active at baseline, riding a stationary bike at the gym several days per week without cardiac limitation.   Labs independently reviewed: 02/2024 - Hgb 14.4, PLT 352, potassium 5.1, BUN 22, serum creatinine 1.18, albumin 3.6, AST/ALT normal, magnesium 2.3 01/2024 - A1c 7, TC 146, TG 45, HDL 65, LDL 72 12/2023 - TSH normal  Past Medical History:  Diagnosis Date   Adenomatous polyp of colon    Arthritis    xDJD   Atrial fibrillation (HCC)    BPH (benign prostatic hyperplasia)    CHF (congestive heart failure) (HCC)    COPD (chronic obstructive pulmonary disease) (HCC)    Diabetes mellitus without complication (HCC)    type 2   Discitis    Diverticulitis    Diverticulosis    DVT (deep venous thrombosis) (HCC)    bilateral tibial   Erectile dysfunction    Hearing loss    Hyperlipidemia    Hypertension    Ischemic optic neuropathy    Lower GI bleeding    Lower GI bleeding 02/23/2018   Lumbar degenerative disc disease    Non-ischemic cardiomyopathy (HCC)    Sleep apnea     Past Surgical History:  Procedure Laterality Date   ATRIAL ABLATION SURGERY  2010   CATARACT EXTRACTION W/PHACO Right 10/10/2020   Procedure: CATARACT EXTRACTION PHACO AND INTRAOCULAR LENS PLACEMENT (  IOC) RIGHT ISTENT INJ DIABETIC 2.64  00:29.4;  Surgeon: Rosa College, MD;  Location: [facility];  Service: Ophthalmology;  Laterality: Right;  Diabetic - oral meds   CATARACT EXTRACTION W/PHACO Left 03/06/2021   Procedure: CATARACT EXTRACTION PHACO AND INTRAOCULAR LENS PLACEMENT (IOC) LEFT DIABETIC ISTENT INJ 3.62 00:30.6;  Surgeon: Rosa College, MD;  Location: [facility];  Service: Ophthalmology;  Laterality: Left;  Diabetic - oral meds   CHOLECYSTECTOMY  2011    COLONOSCOPY Left 02/24/2018   Procedure: COLONOSCOPY;  Surgeon: Irby Mannan, MD;  Location: [facility] ENDOSCOPY;  Service: Endoscopy;  Laterality: Left;   COLONOSCOPY     2008, 2010, 2013   COLONOSCOPY N/A 04/06/2023   Procedure: COLONOSCOPY;  Surgeon: Toledo, Alphonsus Jeans, MD;  Location: [facility] ENDOSCOPY;  Service: Gastroenterology;  Laterality: N/A;   COLONOSCOPY WITH PROPOFOL N/A 09/25/2017   Procedure: COLONOSCOPY WITH PROPOFOL;  Surgeon: Cassie Click, MD;  Location: [facility] ENDOSCOPY;  Service: Endoscopy;  Laterality: N/A;   COLONOSCOPY WITH PROPOFOL N/A 02/18/2018   Procedure: COLONOSCOPY WITH PROPOFOL;  Surgeon: Marnee Sink, MD;  Location: [facility] ENDOSCOPY;  Service: Endoscopy;  Laterality: N/A;   ERCP  2011   ESOPHAGOGASTRODUODENOSCOPY Left 02/23/2018   Procedure: ESOPHAGOGASTRODUODENOSCOPY (EGD);  Surgeon: Irby Mannan, MD;  Location: [facility] ENDOSCOPY;  Service: Endoscopy;  Laterality: Left;   ESOPHAGOGASTRODUODENOSCOPY  2014   IVC FILTER INSERTION N/A 04/17/2022   Procedure: IVC FILTER INSERTION;  Surgeon: Jackquelyn Mass, MD;  Location: [facility];  Service: Cardiovascular;  Laterality: N/A;   IVC FILTER REMOVAL N/A 07/31/2022   Procedure: IVC FILTER REMOVAL;  Surgeon: Jackquelyn Mass, MD;  Location: [facility];  Service: Cardiovascular;  Laterality: N/A;   LAPAROTOMY  01/01/2017   Procedure: EXPLORATORY LAPAROTOMY;  Surgeon: Emmalene Hare, MD;  Location: [facility] ORS;  Service: General;;   PARTIAL COLECTOMY N/A 01/01/2017   Procedure: PARTIAL COLECTOMY;  Surgeon: Emmalene Hare, MD;  Location: [facility] OR[facility];  Service: General;  Laterality: N/A;   PERIPHERAL VASCULAR CATHETERIZATION N/A 12/28/2016   Procedure: Visceral Artery Intervention;  Surgeon: Celso College, MD;  Location: [facility];  Service: Cardiovascular;  Laterality: N/A;   SECONDARY CLOSURE OF WOUND N/A 01/08/2017   Procedure: SECONDARY CLOSURE FOR EVISCERATION;  Surgeon: Claudia Cuff,  MD;  Location: [facility] ORS;  Service: General;  Laterality: N/A;   SIGMOIDECTOMY  2018   TOTAL HIP ARTHROPLASTY Right 04/24/2022   Procedure: TOTAL HIP ARTHROPLASTY ANTERIOR APPROACH;  Surgeon: Molli Angelucci, MD;  Location: [facility] ORS;  Service: Orthopedics;  Laterality: Right;   TOTAL KNEE ARTHROPLASTY Right 03/24/2014   times 4    Current Medications: Current Meds  Medication Sig   acetaminophen (TYLENOL) 500 MG tablet Take 1,000 mg by mouth every 8 (eight) hours as needed.   albuterol (VENTOLIN HFA) 108 (90 Base) MCG/ACT inhaler SMARTSIG:2 inhalation Via Inhaler Every 6 Hours PRN   amiodarone (PACERONE) 200 MG tablet Take 1 tablet (200 mg total) by mouth daily.   apixaban (ELIQUIS) 5 MG TABS tablet Take 1 tablet (5 mg total) by mouth 2 (two) times daily.   ARNUITY ELLIPTA 100 MCG/ACT AEPB Inhale into the lungs.   Cholecalciferol (VITAMIN D3) 2000 units capsule Take 2,000 Units by mouth daily.   empagliflozin (JARDIANCE) 10 MG TABS tablet Take 1 tablet (10 mg total) by mouth daily.   furosemide (LASIX) 40 MG tablet Take 1 tablet (40 mg total) by mouth daily.   hydrOXYzine (ATARAX/VISTARIL) 25 MG  tablet Take 25 mg by mouth at bedtime as needed (sleep).   JARDIANCE 25 MG TABS tablet Take 25 mg by mouth daily.   metFORMIN (GLUCOPHAGE-XR) 500 MG 24 hr tablet 1,000 mg daily with breakfast.    Multiple Vitamins-Minerals (CENTRUM SILVER PO) Take 1 tablet by mouth every morning.   polyethylene glycol (MIRALAX / GLYCOLAX) packet Take 17 g by mouth daily. (Patient taking differently: Take 17 g by mouth daily as needed for mild constipation.)   pravastatin (PRAVACHOL) 40 MG tablet Take 40 mg by mouth at bedtime.   spironolactone (ALDACTONE) 25 MG tablet Take 25 mg by mouth daily.   tamsulosin (FLOMAX) 0.4 MG CAPS capsule Take by mouth.   vitamin B-12 (CYANOCOBALAMIN) 1000 MCG tablet Take 1,000 mcg by mouth daily.   [DISCONTINUED] losartan (COZAAR) 50 MG tablet Take 50 mg by mouth daily.    [DISCONTINUED] metoprolol succinate (TOPROL-XL) 25 MG 24 hr tablet Take 25 mg by mouth daily.    Allergies:   Azithromycin, Penicillins, Lisinopril, Losartan, and Metoprolol   Social History   Socioeconomic History   Marital status: Married    Spouse name: Gonzalo Lather   Number of children: 1   Years of education: Not on file   Highest education level: Not on file  Occupational History   Not on file  Tobacco Use   Smoking status: Former    Current packs/day: 0.00    Average packs/day: 0.3 packs/day for 29.0 years (7.3 ttl pk-yrs)    Types: Cigarettes    Start date: 12/31/1964    Quit date: 12/31/1993    Years since quitting: 30.3    Passive exposure: Past   Smokeless tobacco: Never  Vaping Use   Vaping status: Never Used  Substance and Sexual Activity   Alcohol use: No    Alcohol/week: 0.0 standard drinks of alcohol   Drug use: No   Sexual activity: Not on file  Other Topics Concern   Not on file  Social History Narrative   Not on file   Social Drivers of Health   Financial Resource Strain: Low Risk  (02/03/2024)   Received from [facility]   Overall Financial Resource Strain (CARDIA)    Difficulty of Paying Living Expenses: Not hard at all  Food Insecurity: No Food Insecurity (02/03/2024)   [facility]   Hunger Vital Sign    Worried About Running Out of Food in the Last Year: Never true    Ran Out of Food in the Last Year: Never true  Transportation Needs: No Transportation Needs (02/03/2024)   Received [facility] - Transportation    In the past 12 months, has lack of transportation kept you from medical appointments or from getting medications?: No    Lack of Transportation (Non-Medical): No  Physical Activity: Not on file  Stress: Not on file  Social Connections: Not on file     Family History:  The patient's family history includes Diabetes in his brother, brother, sister, and sister;  Heart attack in his brother; Ovarian cancer in his sister.  ROS:   12-point review of systems is negative unless otherwise noted in the HPI.   EKGs/Labs/Other Studies Reviewed:    Studies reviewed were summarized above. The additional studies were reviewed today:  2D echo 01/28/2024: 1. Left ventricular ejection fraction, by estimation, is 35 to 40%. The  left ventricle has moderately decreased function. The left ventricle  demonstrates global hypokinesis. The  left ventricular internal cavity size  was mildly dilated. Left ventricular  diastolic parameters are consistent with Grade I diastolic dysfunction  (impaired relaxation).   2. Right ventricular systolic function is normal. The right ventricular  size is normal.   3. The mitral valve is normal in structure. No evidence of mitral valve  regurgitation. No evidence of mitral stenosis.   4. The aortic valve is tricuspid. There is mild calcification of the  aortic valve. Aortic valve regurgitation is not visualized. No aortic  stenosis is present.   5. The inferior vena cava is normal in size with greater than 50%  respiratory variability, suggesting right atrial pressure of 3 mmHg.  __________  2D echo 12/06/2023 [facility]): Summary   1. Technically difficult study.    2. The left ventricle is mildly dilated in size with mildly increased wall  thickness.   3. The left ventricular systolic function is severely decreased, LVEF is  visually estimated at 25-30%.    4. There is mild mitral valve regurgitation.    5. The aortic valve is probably trileaflet with mildly thickened leaflets  with mildly reduced excursion.    6. The right ventricle is normal in size, with mildly reduced systolic  function.   7. IVC size and inspiratory change suggest elevated right atrial pressure.  (10-20 mmHg).  __________  2D echo 05/20/2023: 1. Left ventricular ejection fraction, by estimation, is 50 to 55%. The  left ventricle has low normal  function. Left ventricular endocardial  border not optimally defined to evaluate regional wall motion. The left  ventricular internal cavity size was  mildly dilated. There is mild left ventricular hypertrophy. Left  ventricular diastolic parameters are indeterminate.   2. Right ventricular systolic function is normal. The right ventricular  size is normal. Tricuspid regurgitation signal is inadequate for assessing  PA pressure.   3. Left atrial size was mildly dilated.   4. Right atrial size was mildly dilated.   5. The mitral valve is normal in structure. No evidence of mitral valve  regurgitation. No evidence of mitral stenosis.   6. The aortic valve is normal in structure. Aortic valve regurgitation is  not visualized. Aortic valve sclerosis/calcification is present, without  any evidence of aortic stenosis.   7. The inferior vena cava is normal in size with greater than 50%  respiratory variability, suggesting right atrial pressure of 3 mmHg.  __________  2D echo 02/24/2018: - Left ventricle: The cavity size was normal. Systolic function was    normal. The estimated ejection fraction was in the range of 55%    to 60%.  - Aortic valve: There was trivial regurgitation. Valve area (VTI):    2.13 cm^2. Valve area (Vmax): 2.61 cm^2. Valve area (Vmean): 2.22    cm^2.  - Mitral valve: Valve area by continuity equation (using LVOT    flow): 2.97 cm^2.  __________  2D echo 11/18/2015 [facility]): INTERPRETATION  NORMAL LEFT VENTRICULAR SYSTOLIC FUNCTION  NORMAL RIGHT VENTRICULAR SYSTOLIC FUNCTION  TRIVIAL REGURGITATION NOTED (See above)  NO VALVULAR STENOSIS  Closest EF: >55% (Estimated)  Mitral: TRIVIAL MR  Tricuspid: TRIVIAL TR  LVH: MILD LVH   EKG:  EKG is ordered today.  The EKG ordered today demonstrates NSR, 83 bpm, first-degree AV block, LBBB  Recent Labs: 05/19/2023: B Natriuretic Peptide 140.5 05/21/2023: BUN 10; Potassium 3.6; Sodium 134 05/22/2023: Creatinine, Ser  0.88 09/09/2023: Hemoglobin 13.8; Platelet Count 321  Recent Lipid Panel    Component Value Date/Time   CHOL  85 08/21/2012 0502   TRIG 74 08/21/2012 0502   HDL 26 (L) 08/21/2012 0502   VLDL 15 08/21/2012 0502   LDLCALC 44 08/21/2012 0502    PHYSICAL EXAM:    VS:  BP (!) 98/56   Pulse 86   Ht 6\' 4"  (1.93 m)   Wt 266 lb 12.8 oz (121 kg)   SpO2 98%   BMI 32.48 kg/m   BMI: Body mass index is 32.48 kg/m.  Physical Exam  Wt Readings from Last 3 Encounters:  04/14/24 266 lb 12.8 oz (121 kg)  03/07/24 261 lb 3.2 oz (118.5 kg)  01/30/24 263 lb (119.3 kg)     ASSESSMENT & PLAN:    HFrEF with LBBB and exertional dyspnea: He appears euvolemic and well compensated.  He was initially diagnosed with cardiomyopathy with an EF of 25 to 30% by echo in 12/2023 while in atrial flutter.  It also appears his left bundle branch block first appeared at the onset of his cardiomyopathy.  Repeat echo in 01/2024, following restoration of sinus rhythm and on GDMT showed a slight improvement in EF of 35 to 40% with persistent LBBB.  With relative hypotension we will need to de-escalate GDMT with losartan being decreased to 25 mg and Toprol-XL to 12.5 mg.  For now, he remains on spironolactone 25 mg pending BMP, Jardiance 25 mg, and furosemide to 40 mg daily.  Hypotension precludes escalation of GDMT.  Given he continues to have exertional dyspnea and in the context of persistent cardiomyopathy with newly diagnosed LBBB in 12/2023, we will pursue R/LHC.  He will need to hold apixaban for 48 hours prior to cardiac cath, as well as metformin.  We will repeat a limited echo to trend cardiomyopathy as well.  Further recommendations pending cardiac cath.  Persistent A-fib/flutter: Status post A-fib ablation in 2010 and cardioversion for atrial flutter in 12/2023.  Maintaining sinus rhythm on Toprol-XL which will be reduced to 12.5 mg given relative hypotension to 200 mg daily.  CHA2DS2-VASc at least 6 (CHF, HTN, age x  2, DM, vascular disease).  Remains on apixaban 5 mg twice daily and does not meet reduced dosing criteria.  Check CBC, CMP, and TSH.  Should he have recurrent atrial arrhythmia, would recommend referral to EP.  HTN: Blood pressure soft at 98/56, similar to the reading at visit in 01/2024.  Reduce losartan to 25 mg daily and Toprol-XL to 12.5 mg daily.  He otherwise remains on spironolactone 25 mg daily for now pending updated labs as outlined below.  HLD: LDL 72 in 01/2024.  Remains on pravastatin 40 mg.  Pending cardiac cath findings, may need to escalate to higher intensity statin.  Hyperkalemia: Potassium 5.1 noted at ED visit in 02/2024.  Remains on spironolactone 25 mg.  Check BMP.   Informed Consent   Shared Decision Making/Informed Consent{  The risks [stroke (1 in 1000), death (1 in 1000), kidney failure [usually temporary] (1 in 500), bleeding (1 in 200), allergic reaction [possibly serious] (1 in 200)], benefits (diagnostic support and management of coronary artery disease) and alternatives of a cardiac catheterization were discussed in detail with Mr. Blanchard and he is willing to proceed.        Disposition: F/u with Dr. Gollan or an APP in 3 to 4 weeks.   Medication Adjustments/Labs and Tests Ordered: Current medicines are reviewed at length with the patient today.  Concerns regarding medicines are outlined above. Medication changes, Labs and Tests ordered today are summarized above  and listed in the Patient Instructions accessible in Encounters.   Signed, Varney Gentleman, PA-C 04/14/2024 1:25 PM     [facility] [address] [place], [place] [address] [phone]

## 2024-04-14 NOTE — Patient Instructions (Signed)
 Medication Instructions:  Your physician recommends the following medication changes.  DECREASE: Losartan 25 mg daily Toprol 12.5 mg daily  *If you need a refill on your cardiac medications before your next appointment, please call your pharmacy*  Lab Work: Your provider would like for you to have following labs drawn today CMeT, TSH, or CBC.   If you have labs (blood work) drawn today and your tests are completely normal, you will receive your results only by: MyChart Message (if you have MyChart) OR A paper copy in the mail If you have any lab test that is abnormal or we need to change your treatment, we will call you to review the results.  Testing/Procedures:  [facility] DEPT OF [facility] [place] [address] [place] [address] Dept: [phone] Loc: [phone]  Evan [name]  04/14/2024  You are scheduled for a Cardiac Catheterization on Friday, May 2 with Dr. Cristal Deer End.  1. Please arrive at the [facility] Entrance of [facility], [address], [place] [address] at 6:30 AM (This is 1 hour(s) prior to your procedure time).  Proceed to the Check-In Desk directly inside the entrance.  Procedure Parking: Use the entrance off of the [address] side of the hospital. Turn right upon entering and follow the driveway to parking that is directly in front of the [facility] Vascular Center. There is no valet parking available at this entrance, however there is an awning directly in front of the Heart & Vascular Center for drop off/ pick up for patients.  Special note: Every effort is made to have your procedure done on time. Please understand that emergencies sometimes delay scheduled procedures.  2. Diet: Do not eat solid foods after midnight.  The patient may have clear liquids until 5am upon the day of the procedure.  3. Labs: You will need to have blood drawn  today  4. Medication instructions in preparation for your procedure:   Contrast Allergy: No  Stop taking Eliquis (Apixiban) on Tuesday, April 29. (Last dose this night)  Stop taking, Lasix (Furosemide)  and Spironolactone Thursday, May 1, (last dose this day)   Do not take Diabetes Med Glucophage (Metformin) on the day of the procedure and HOLD 48 HOURS AFTER THE PROCEDURE.  On the morning of your procedure, take an Aspirin 81 mg.  You may use sips of water.  5. Plan to go home the same day, you will only stay overnight if medically necessary. 6. Bring a current list of your medications and current insurance cards. 7. You MUST have a responsible person to drive you home. 8. Someone MUST be with you the first 24 hours after you arrive home or your discharge will be delayed. 9. Please wear clothes that are easy to get on and off and wear slip-on shoes.  Thank you for allowing [place] to care for you!   -- [facility]   Your physician has requested that you have an LIMITED echocardiogram. Echocardiography is a painless test that uses sound waves to create images of your heart. It provides your doctor with information about the size and shape of your heart and how well your heart's chambers and valves are working.   You may receive an ultrasound enhancing agent through an IV if needed to better visualize your heart during the echo. This procedure takes approximately one hour.  There are no restrictions for  this procedure.  This will take place at [address] [facility]) [address], [place] [address]  Please note: We ask at that you not bring children with you during ultrasound (echo/ vascular) testing. Due to room size and safety concerns, children are not allowed in the ultrasound rooms during exams. Our front office staff cannot provide observation of children in our lobby area while testing is being conducted. An adult accompanying a patient to  their appointment will only be allowed in the ultrasound room at the discretion of the ultrasound technician under special circumstances. We apologize for any inconvenience.   Follow-Up: At [facility], you and your health needs are our priority.  As part of our continuing mission to provide you with exceptional heart care, our providers are all part of one team.  This team includes your primary Cardiologist (physician) and Advanced Practice Providers or APPs (Physician Assistants and Nurse Practitioners) who all work together to provide you with the care you need, when you need it.  Your next appointment:   3-4 week(s)  Provider:   You may see Timothy Gollan, MD or Varney Gentleman, PA-C

## 2024-04-15 ENCOUNTER — Other Ambulatory Visit: Payer: Self-pay | Admitting: Emergency Medicine

## 2024-04-15 DIAGNOSIS — Z79899 Other long term (current) drug therapy: Secondary | ICD-10-CM

## 2024-04-15 LAB — COMPREHENSIVE METABOLIC PANEL WITH GFR
ALT: 13 IU/L (ref 0–44)
AST: 14 IU/L (ref 0–40)
Albumin: 4.4 g/dL (ref 3.8–4.8)
Alkaline Phosphatase: 136 IU/L — ABNORMAL HIGH (ref 44–121)
BUN/Creatinine Ratio: 12 (ref 10–24)
BUN: 17 mg/dL (ref 8–27)
Bilirubin Total: 0.9 mg/dL (ref 0.0–1.2)
CO2: 24 mmol/L (ref 20–29)
Calcium: 10.6 mg/dL — ABNORMAL HIGH (ref 8.6–10.2)
Chloride: 94 mmol/L — ABNORMAL LOW (ref 96–106)
Creatinine, Ser: 1.4 mg/dL — ABNORMAL HIGH (ref 0.76–1.27)
Globulin, Total: 2.9 g/dL (ref 1.5–4.5)
Glucose: 128 mg/dL — ABNORMAL HIGH (ref 70–99)
Potassium: 5.4 mmol/L — ABNORMAL HIGH (ref 3.5–5.2)
Sodium: 137 mmol/L (ref 134–144)
Total Protein: 7.3 g/dL (ref 6.0–8.5)
eGFR: 51 mL/min/{1.73_m2} — ABNORMAL LOW (ref >59)

## 2024-04-15 LAB — CBC
Hematocrit: 45.6 % (ref 37.5–51.0)
Hemoglobin: 15 g/dL (ref 13.0–17.7)
MCH: 27.3 pg (ref 26.6–33.0)
MCHC: 32.9 g/dL (ref 31.5–35.7)
MCV: 83 fL (ref 79–97)
Platelets: 297 10*3/uL (ref 150–450)
RBC: 5.49 x10E6/uL (ref 4.14–5.80)
RDW: 14.7 % (ref 11.6–15.4)
WBC: 5.5 10*3/uL (ref 3.4–10.8)

## 2024-04-15 LAB — TSH: TSH: 0.762 u[IU]/mL (ref 0.450–4.500)

## 2024-04-16 ENCOUNTER — Other Ambulatory Visit: Payer: Self-pay | Admitting: Physician Assistant

## 2024-04-17 LAB — BASIC METABOLIC PANEL WITH GFR
BUN/Creatinine Ratio: 12 (ref 10–24)
BUN: 17 mg/dL (ref 8–27)
CO2: 24 mmol/L (ref 20–29)
Calcium: 9.9 mg/dL (ref 8.6–10.2)
Chloride: 96 mmol/L (ref 96–106)
Creatinine, Ser: 1.47 mg/dL — ABNORMAL HIGH (ref 0.76–1.27)
Glucose: 91 mg/dL (ref 70–99)
Potassium: 5 mmol/L (ref 3.5–5.2)
Sodium: 139 mmol/L (ref 134–144)
eGFR: 48 mL/min/{1.73_m2} — ABNORMAL LOW (ref >59)

## 2024-05-01 ENCOUNTER — Encounter
Admission: RE | Disposition: A | Discharge status: Home / Self Care | Payer: Self-pay | Source: Home / Self Care | Attending: Internal Medicine

## 2024-05-01 ENCOUNTER — Encounter: Payer: Self-pay | Admitting: Internal Medicine

## 2024-05-01 ENCOUNTER — Other Ambulatory Visit: Payer: Self-pay

## 2024-05-01 ENCOUNTER — Ambulatory Visit
Admission: RE | Admit: 2024-05-01 | Discharge: 2024-05-01 | Disposition: A | Discharge status: Home / Self Care | Attending: Internal Medicine | Admitting: Internal Medicine

## 2024-05-01 DIAGNOSIS — I2510 Atherosclerotic heart disease of native coronary artery without angina pectoris: Secondary | ICD-10-CM | POA: Insufficient documentation

## 2024-05-01 DIAGNOSIS — I4892 Unspecified atrial flutter: Secondary | ICD-10-CM | POA: Diagnosis not present

## 2024-05-01 DIAGNOSIS — I272 Pulmonary hypertension, unspecified: Secondary | ICD-10-CM | POA: Insufficient documentation

## 2024-05-01 DIAGNOSIS — I5022 Chronic systolic (congestive) heart failure: Secondary | ICD-10-CM | POA: Diagnosis not present

## 2024-05-01 DIAGNOSIS — Z8546 Personal history of malignant neoplasm of prostate: Secondary | ICD-10-CM | POA: Insufficient documentation

## 2024-05-01 DIAGNOSIS — E875 Hyperkalemia: Secondary | ICD-10-CM | POA: Diagnosis not present

## 2024-05-01 DIAGNOSIS — Z7901 Long term (current) use of anticoagulants: Secondary | ICD-10-CM | POA: Insufficient documentation

## 2024-05-01 DIAGNOSIS — Z923 Personal history of irradiation: Secondary | ICD-10-CM | POA: Diagnosis not present

## 2024-05-01 DIAGNOSIS — I447 Left bundle-branch block, unspecified: Secondary | ICD-10-CM | POA: Insufficient documentation

## 2024-05-01 DIAGNOSIS — I4819 Other persistent atrial fibrillation: Secondary | ICD-10-CM | POA: Diagnosis not present

## 2024-05-01 DIAGNOSIS — E785 Hyperlipidemia, unspecified: Secondary | ICD-10-CM | POA: Insufficient documentation

## 2024-05-01 DIAGNOSIS — Z79899 Other long term (current) drug therapy: Secondary | ICD-10-CM | POA: Insufficient documentation

## 2024-05-01 DIAGNOSIS — Z86718 Personal history of other venous thrombosis and embolism: Secondary | ICD-10-CM | POA: Insufficient documentation

## 2024-05-01 DIAGNOSIS — I428 Other cardiomyopathies: Secondary | ICD-10-CM | POA: Diagnosis not present

## 2024-05-01 DIAGNOSIS — Z87891 Personal history of nicotine dependence: Secondary | ICD-10-CM | POA: Diagnosis not present

## 2024-05-01 DIAGNOSIS — I110 Hypertensive heart disease with heart failure: Secondary | ICD-10-CM | POA: Insufficient documentation

## 2024-05-01 DIAGNOSIS — E119 Type 2 diabetes mellitus without complications: Secondary | ICD-10-CM | POA: Insufficient documentation

## 2024-05-01 DIAGNOSIS — I5020 Unspecified systolic (congestive) heart failure: Secondary | ICD-10-CM | POA: Diagnosis not present

## 2024-05-01 DIAGNOSIS — G4733 Obstructive sleep apnea (adult) (pediatric): Secondary | ICD-10-CM | POA: Insufficient documentation

## 2024-05-01 LAB — POCT I-STAT 7, (LYTES, BLD GAS, ICA,H+H)
Acid-Base Excess: 0 mmol/L (ref 0.0–2.0)
Bicarbonate: 25.3 mmol/L (ref 20.0–28.0)
Calcium, Ion: 1.2 mmol/L (ref 1.15–1.40)
HCT: 42 % (ref 39.0–52.0)
Hemoglobin: 14.3 g/dL (ref 13.0–17.0)
O2 Saturation: 89 %
Potassium: 3.9 mmol/L (ref 3.5–5.1)
Sodium: 141 mmol/L (ref 135–145)
TCO2: 27 mmol/L (ref 22–32)
pCO2 arterial: 41.1 mmHg (ref 32–48)
pH, Arterial: 7.398 (ref 7.35–7.45)
pO2, Arterial: 56 mmHg — ABNORMAL LOW (ref 83–108)

## 2024-05-01 LAB — GLUCOSE, CAPILLARY: Glucose-Capillary: 111 mg/dL — ABNORMAL HIGH (ref 70–99)

## 2024-05-01 SURGERY — RIGHT/LEFT HEART CATH AND CORONARY ANGIOGRAPHY
Anesthesia: Moderate Sedation | Laterality: Bilateral

## 2024-05-01 MED ORDER — SODIUM CHLORIDE 0.9 % IV SOLN: INTRAVENOUS | Status: DC

## 2024-05-01 MED ORDER — HEPARIN SODIUM (PORCINE) 1000 UNIT/ML IJ SOLN
INTRAMUSCULAR | Status: AC
Start: 2024-05-01 — End: ?
  Filled 2024-05-01: qty 10

## 2024-05-01 MED ORDER — VERAPAMIL HCL 2.5 MG/ML IV SOLN
INTRAVENOUS | Status: AC
  Filled 2024-05-01: qty 2

## 2024-05-01 MED ORDER — HEPARIN SODIUM (PORCINE) 1000 UNIT/ML IJ SOLN
INTRAMUSCULAR | Status: DC | PRN
  Administered 2024-05-01: 5000 [IU] via INTRAVENOUS

## 2024-05-01 MED ORDER — HEPARIN (PORCINE) IN NACL 1000-0.9 UT/500ML-% IV SOLN
INTRAVENOUS | Status: DC | PRN
  Administered 2024-05-01: 1000 mL

## 2024-05-01 MED ORDER — HEPARIN (PORCINE) IN NACL 1000-0.9 UT/500ML-% IV SOLN
INTRAVENOUS | Status: AC
  Filled 2024-05-01: qty 1000

## 2024-05-01 MED ORDER — MIDAZOLAM HCL 2 MG/2ML IJ SOLN
INTRAMUSCULAR | Status: DC | PRN
  Administered 2024-05-01: 1 mg via INTRAVENOUS

## 2024-05-01 MED ORDER — SODIUM CHLORIDE 0.9% FLUSH: 3.0000 mL | INTRAVENOUS | Status: DC | PRN

## 2024-05-01 MED ORDER — MIDAZOLAM HCL 2 MG/2ML IJ SOLN
INTRAMUSCULAR | Status: AC
  Filled 2024-05-01: qty 2

## 2024-05-01 MED ORDER — FENTANYL CITRATE (PF) 100 MCG/2ML IJ SOLN
INTRAMUSCULAR | Status: DC | PRN
  Administered 2024-05-01: 25 ug via INTRAVENOUS

## 2024-05-01 MED ORDER — SODIUM CHLORIDE 0.9 % IV SOLN
INTRAVENOUS | Status: DC
Start: 2024-05-01 — End: 2024-05-01

## 2024-05-01 MED ORDER — VERAPAMIL HCL 2.5 MG/ML IV SOLN
INTRAVENOUS | Status: DC | PRN
  Administered 2024-05-01: 2.5 mg via INTRA_ARTERIAL

## 2024-05-01 MED ORDER — SODIUM CHLORIDE 0.9 % IV SOLN: 250.0000 mL | INTRAVENOUS | Status: DC | PRN

## 2024-05-01 MED ORDER — LIDOCAINE HCL (PF) 1 % IJ SOLN
INTRAMUSCULAR | Status: DC | PRN
  Administered 2024-05-01 (×2): 2 mL

## 2024-05-01 MED ORDER — ASPIRIN 81 MG PO CHEW: 81.0000 mg | CHEWABLE_TABLET | ORAL | Status: DC

## 2024-05-01 MED ORDER — FENTANYL CITRATE (PF) 100 MCG/2ML IJ SOLN
INTRAMUSCULAR | Status: AC
  Filled 2024-05-01: qty 2

## 2024-05-01 MED ORDER — ONDANSETRON HCL 4 MG/2ML IJ SOLN: 4.0000 mg | Freq: Four times a day (QID) | INTRAMUSCULAR | Status: DC | PRN

## 2024-05-01 MED ORDER — IOHEXOL 300 MG/ML  SOLN
INTRAMUSCULAR | Status: DC | PRN
  Administered 2024-05-01: 29 mL

## 2024-05-01 MED ORDER — SODIUM CHLORIDE 0.9% FLUSH: 3.0000 mL | Freq: Two times a day (BID) | INTRAVENOUS | Status: DC

## 2024-05-01 MED ORDER — LIDOCAINE HCL 1 % IJ SOLN
INTRAMUSCULAR | Status: AC
  Filled 2024-05-01: qty 20

## 2024-05-01 MED ORDER — METFORMIN HCL ER 500 MG PO TB24: 1000.0000 mg | ORAL_TABLET | Freq: Every day | ORAL | Status: AC

## 2024-05-01 MED ORDER — ACETAMINOPHEN 325 MG PO TABS: 650.0000 mg | ORAL_TABLET | ORAL | Status: DC | PRN

## 2024-05-01 MED ORDER — HYDRALAZINE HCL 20 MG/ML IJ SOLN: 10.0000 mg | INTRAMUSCULAR | Status: DC | PRN

## 2024-05-01 SURGICAL SUPPLY — 13 items
CATH 5F 110X4 TIG (CATHETERS) IMPLANT
CATH BALLN WEDGE 5F 110CM (CATHETERS) IMPLANT
DEVICE RAD TR BAND REGULAR (VASCULAR PRODUCTS) IMPLANT
DRAPE BRACHIAL (DRAPES) IMPLANT
GLIDESHEATH SLEND SS 6F .021 (SHEATH) IMPLANT
GUIDEWIRE .025 260CM (WIRE) IMPLANT
GUIDEWIRE INQWIRE 1.5J.035X260 (WIRE) IMPLANT
KIT SYRINGE INJ CVI SPIKEX1 (MISCELLANEOUS) IMPLANT
PACK CARDIAC CATH (CUSTOM PROCEDURE TRAY) ×1 IMPLANT
PAD ELECT DEFIB RADIOL ZOLL (MISCELLANEOUS) IMPLANT
SET ATX-X65L (MISCELLANEOUS) IMPLANT
SHEATH GLIDE SLENDER 4/5FR (SHEATH) IMPLANT
STATION PROTECTION PRESSURIZED (MISCELLANEOUS) IMPLANT

## 2024-05-01 NOTE — Interval H&P Note (Signed)
 History and Physical Interval Note:  05/01/2024 7:51 AM  Evan [name]  has presented today for surgery, with the diagnosis of HFrEF.  The various methods of treatment have been discussed with the patient and family. After consideration of risks, benefits and other options for treatment, the patient has consented to  Procedure(s): RIGHT/LEFT HEART CATH AND CORONARY ANGIOGRAPHY (Bilateral) as a surgical intervention.  The patient's history has been reviewed, patient examined, no change in status, stable for surgery.  I have reviewed the patient's chart and labs.  Questions were answered to the patient's satisfaction.    Cath Lab Visit (complete for each Cath Lab visit)  Clinical Evaluation Leading to the Procedure:   ACS: No.  Non-ACS:    Anginal/Heart Failure Classification: NYHA class III  Anti-ischemic medical therapy: Minimal Therapy (1 class of medications)  Non-Invasive Test Results: LVEF 35-40% by echo -> intermediate risk  Prior CABG: No previous CABG  Evan [name]

## 2024-05-04 ENCOUNTER — Encounter: Payer: Self-pay | Admitting: Internal Medicine

## 2024-05-04 LAB — POCT I-STAT EG7
Acid-Base Excess: 2 mmol/L (ref 0.0–2.0)
Bicarbonate: 27.3 mmol/L (ref 20.0–28.0)
Calcium, Ion: 1.18 mmol/L (ref 1.15–1.40)
HCT: 41 % (ref 39.0–52.0)
Hemoglobin: 13.9 g/dL (ref 13.0–17.0)
O2 Saturation: 68 %
Potassium: 3.8 mmol/L (ref 3.5–5.1)
Sodium: 141 mmol/L (ref 135–145)
TCO2: 29 mmol/L (ref 22–32)
pCO2, Ven: 45.4 mmHg (ref 44–60)
pH, Ven: 7.387 (ref 7.25–7.43)
pO2, Ven: 36 mmHg (ref 32–45)

## 2024-05-06 ENCOUNTER — Encounter: Payer: Self-pay | Admitting: Oncology

## 2024-05-08 ENCOUNTER — Encounter: Payer: Self-pay | Admitting: Physician Assistant

## 2024-05-08 ENCOUNTER — Ambulatory Visit: Attending: Physician Assistant | Admitting: Physician Assistant

## 2024-05-08 VITALS — BP 130/62 | HR 71 | Ht 76.0 in | Wt 267.0 lb

## 2024-05-08 DIAGNOSIS — I428 Other cardiomyopathies: Secondary | ICD-10-CM

## 2024-05-08 DIAGNOSIS — I4819 Other persistent atrial fibrillation: Secondary | ICD-10-CM

## 2024-05-08 DIAGNOSIS — I10 Essential (primary) hypertension: Secondary | ICD-10-CM

## 2024-05-08 DIAGNOSIS — I5020 Unspecified systolic (congestive) heart failure: Secondary | ICD-10-CM | POA: Diagnosis not present

## 2024-05-08 DIAGNOSIS — I2510 Atherosclerotic heart disease of native coronary artery without angina pectoris: Secondary | ICD-10-CM | POA: Diagnosis not present

## 2024-05-08 DIAGNOSIS — E875 Hyperkalemia: Secondary | ICD-10-CM

## 2024-05-08 DIAGNOSIS — I447 Left bundle-branch block, unspecified: Secondary | ICD-10-CM

## 2024-05-08 DIAGNOSIS — I4892 Unspecified atrial flutter: Secondary | ICD-10-CM

## 2024-05-08 DIAGNOSIS — E785 Hyperlipidemia, unspecified: Secondary | ICD-10-CM

## 2024-05-08 NOTE — Patient Instructions (Signed)
 Medication Instructions:  Your Physician recommend you continue on your current medication as directed.    *If you need a refill on your cardiac medications before your next appointment, please call your pharmacy*  Lab Work: Your provider would like for you to have following labs drawn today BMet.   If you have labs (blood work) drawn today and your tests are completely normal, you will receive your results only by: MyChart Message (if you have MyChart) OR A paper copy in the mail If you have any lab test that is abnormal or we need to change your treatment, we will call you to review the results.  Testing/Procedures:  Please reschedule the ECHO to August  Follow-Up: At [facility], you and your health needs are our priority.  As part of our continuing mission to provide you with exceptional heart care, our providers are all part of one team.  This team includes your primary Cardiologist (physician) and Advanced Practice Providers or APPs (Physician Assistants and Nurse Practitioners) who all work together to provide you with the care you need, when you need it.  Your next appointment:   4 month(s) After the ECHO is done.  Provider:   You may see Timothy Gollan, MD or one of the following Advanced Practice Providers on your designated Care Team:   Varney Gentleman, PA-C   We recommend signing up for the patient portal called "MyChart".  Sign up information is provided on this After Visit Summary.  MyChart is used to connect with patients for Virtual Visits (Telemedicine).  Patients are able to view lab/test results, encounter notes, upcoming appointments, etc.  Non-urgent messages can be sent to your provider as well.   To learn more about what you can do with MyChart, go to [email or web address].

## 2024-05-08 NOTE — Progress Notes (Signed)
 Cardiology Office Note    Date:  05/08/2024   ID:  Selvin, Graney 11-22-1945, MRN [id number]  PCP:  Eartha Gold, MD  Cardiologist:  Belva Boyden, MD  Electrophysiologist:  None   Chief Complaint: Follow-up  History of Present Illness:   Evan [name] is a 79 y.o. male with history of nonobstructive CAD by LHC in 04/2024, HFrEF secondary to NICM diagnosed in 12/2023, persistent A-fib status post ablation in 2010, atrial flutter status post cardioversion in 12/2023, DVT/PE in 05/2023, GI bleed, DM2, HTN, HLD, prostate cancer status post radiation, LBBB, prior tobacco use quitting in 1995, and OSA on CPAP who presents for follow-up of R/LHC.   He was admitted to [facility] in 12/2023 with A-fib/flutter and new onset HFrEF.  He was diuresed and placed on amiodarone .  He underwent successful cardioversion during admission.  Echo showed an EF of 25 to 30% with normal RV cavity size with mildly reduced RVSF with recommendation for escalation of GDMT.  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  He established care with Dr. Gollan in 01/2024.  Echo in 01/2024 showed an EF of 35 to 40%, global hypokinesis, grade 1 diastolic dysfunction, normal RV systolic function and ventricular cavity size, mild calcification of the aortic valve, and an estimated right atrial pressure of 3 mmHg.   He was seen in the ED in 02/2024 after sustaining a mechanical fall and was prescribed tramadol  for back pain.  He was seen in the ER in 02/2024 dizziness, lightheadedness, back pain, and associated shortness of breath.  High-sensitivity troponin negative x 2.  ProBNP 1303.  Chest x-ray with mildly prominent interstitial markings.  He followed up in our office on 04/14/2024 noting continued shortness of breath that has been present since his hospitalization in 12/2023.  He noted his functional status improved following DCCV, though his shortness of breath persisted.  Weight was stable.  To further evaluate his  cardiomyopathy he underwent R/LHC on 05/01/2024 showed mild, nonobstructive CAD with 10 to 20% stenoses of the mid LAD and proximal LCx as well as 20 to 30% stenosis of the proximal RCA.  Normal left and right heart filling pressure, mild pulm hypertension, and normal cardiac output/index.  He comes in doing well today and is without symptoms of angina or cardiac decompensation.  No lower extremity swelling or progressive orthopnea.  Monitoring fluid and salt intake.  Remains off spironolactone and losartan .  Not requiring standing loop diuretic.  Weight stable.  No falls or symptoms concerning for bleeding.  Eager to get back to the gym.   Labs independently reviewed: 04/2024 - potassium 5.1, BUN 14, serum creatinine 1.1, albumin 4.2, AST/ALT normal, A1c 6.8 03/2024 - Hgb 15.0, PLT 297, TSH normal 01/2024 - TC 146, TG 45, HDL 65, LDL 72   Past Medical History:  Diagnosis Date   Adenomatous polyp of colon    Arthritis    xDJD   Atrial fibrillation (HCC)    BPH (benign prostatic hyperplasia)    CHF (congestive heart failure) (HCC)    COPD (chronic obstructive pulmonary disease) (HCC)    Diabetes mellitus without complication (HCC)    type 2   Discitis    Diverticulitis    Diverticulosis    DVT (deep venous thrombosis) (HCC)    bilateral tibial   Erectile dysfunction    Hearing loss    Hyperlipidemia    Hypertension    Ischemic optic neuropathy    Lower GI bleeding 02/23/2018  Lumbar degenerative disc disease    Non-ischemic cardiomyopathy (HCC)    Sleep apnea     Past Surgical History:  Procedure Laterality Date   ATRIAL ABLATION SURGERY  2010   CATARACT EXTRACTION W/PHACO Right 10/10/2020   Procedure: CATARACT EXTRACTION PHACO AND INTRAOCULAR LENS PLACEMENT (IOC) RIGHT ISTENT INJ DIABETIC 2.64  00:29.4;  Surgeon: Rosa College, MD;  Location: [facility];  Service: Ophthalmology;  Laterality: Right;  Diabetic - oral meds   CATARACT EXTRACTION W/PHACO Left 03/06/2021    Procedure: CATARACT EXTRACTION PHACO AND INTRAOCULAR LENS PLACEMENT (IOC) LEFT DIABETIC ISTENT INJ 3.62 00:30.6;  Surgeon: Rosa College, MD;  Location: [facility];  Service: Ophthalmology;  Laterality: Left;  Diabetic - oral meds   CHOLECYSTECTOMY  2011   COLONOSCOPY Left 02/24/2018   Procedure: COLONOSCOPY;  Surgeon: Irby Mannan, MD;  Location: [facility] END[facility]COPY;  Service: Endoscopy;  Laterality: Left;   COLONOSCOPY     2008, 2010, 2013   COLONOSCOPY N/A 04/06/2023   Procedure: COLONOSCOPY;  Surgeon: Toledo, Alphonsus Jeans, MD;  Location: [facility] ENDOSCOPY;  Service: Gastroenterology;  Laterality: N/A;   COLONOSCOPY WITH PROPOFOL  N/A 09/25/2017   Procedure: COLONOSCOPY WITH PROPOFOL ;  Surgeon: Cassie Click, MD;  Location: [facility] ENDOSCOPY;  Service: Endoscopy;  Laterality: N/A;   COLONOSCOPY WITH PROPOFOL  N/A 02/18/2018   Procedure: COLONOSCOPY WITH PROPOFOL ;  Surgeon: Marnee Sink, MD;  Location: [facility] END[facility]COPY;  Service: Endoscopy;  Laterality: N/A;   ERCP  2011   ESOPHAGOGASTRODUODENOSCOPY Left 02/23/2018   Procedure: ESOPHAGOGASTRODUODENOSCOPY (EGD);  Surgeon: Irby Mannan, MD;  Location: [facility] ENDOSCOPY;  Service: Endoscopy;  Laterality: Left;   ESOPHAGOGASTRODUODENOSCOPY  2014   IVC FILTER INSERTION N/A 04/17/2022   Procedure: IVC FILTER INSERTION;  Surgeon: Jackquelyn Mass, MD;  Location: [facility] INVAS[facility] CV [facility];  Service: Cardiovascular;  Laterality: N/A;   IVC FILTER REMOVAL N/A 07/31/2022   Procedure: IVC FILTER REMOVAL;  Surgeon: Jackquelyn Mass, MD;  Location: [facility];  Service: Cardiovascular;  Laterality: N/A;   LAPAROTOMY  01/01/2017   Procedure: EXPLORATORY LAPAROTOMY;  Surgeon: Emmalene Hare, MD;  Location: [facility] ORS;  Service: General;;   PARTIAL COLECTOMY N/A 01/01/2017   Procedure: PARTIAL COLECTOMY;  Surgeon: Emmalene Hare, MD;  Location: [facility] ORS;  Service: General;  Laterality: N/A;   PERIPHERAL VASCULAR CATHETERIZATION N/A 12/28/2016    Procedure: Visceral Artery Intervention;  Surgeon: Celso College, MD;  Location: [facility] IN[facility] CV LAB;  Service: Cardiovascular;  Laterality: N/A;   RIGHT/LEFT HEART CATH AND CORONARY ANGIOGRAPHY Bilateral 05/01/2024   Procedure: RIGHT/LEFT HEART CATH AND CORONARY ANGIOGRAPHY;  Surgeon: Sammy Crisp, MD;  Location: [facility] CV [facility];  Service: Cardiovascular;  Laterality: Bilateral;   SECONDARY CLOSURE OF WOUND N/A 01/08/2017   Procedure: SECONDARY CLOSURE FOR EVISCERATION;  Surgeon: Claudia Cuff, MD;  Location: [facility] ORS;  Service: General;  Laterality: N/A;   SIGMOIDECTOMY  2018   TOTAL HIP ARTHROPLASTY Right 04/24/2022   Procedure: TOTAL HIP ARTHROPLASTY ANTERIOR APPROACH;  Surgeon: Molli Angelucci, MD;  Location: [facility] ORS;  Service: Orthopedics;  Laterality: Right;   TOTAL KNEE ARTHROPLASTY Right 03/24/2014   times 4    Current Medications: Current Meds  Medication Sig   acetaminophen  (TYLENOL ) 500 MG tablet Take 1,000 mg by mouth every 8 (eight) hours as needed.   albuterol  (VENTOLIN  HFA) 108 (90 Base) MCG/ACT inhaler Inhale 2 puffs into the lungs every 6 (six) hours as needed for shortness of breath or wheezing.   amiodarone  (PACERONE )  200 MG tablet Take 1 tablet (200 mg total) by mouth daily.   apixaban  (ELIQUIS ) 5 MG TABS tablet Take 1 tablet (5 mg total) by mouth 2 (two) times daily.   ARNUITY ELLIPTA 100 MCG/ACT AEPB Inhale 1 puff into the lungs daily.   Cholecalciferol  (VITAMIN D3) 2000 units capsule Take 2,000 Units by mouth daily.   hydrOXYzine  (ATARAX /VISTARIL ) 25 MG tablet Take 25 mg by mouth at bedtime as needed (sleep).   JARDIANCE  25 MG TABS tablet Take 25 mg by mouth daily.   metFORMIN  (GLUCOPHAGE -XR) 500 MG 24 hr tablet Take 2 tablets (1,000 mg total) by mouth daily with breakfast.   metoprolol  succinate (TOPROL -XL) 25 MG 24 hr tablet Take 0.5 tablets (12.5 mg total) by mouth daily.   Multiple Vitamins-Minerals (CENTRUM SILVER PO) Take 1 tablet by mouth every  morning.   polyethylene glycol (MIRALAX  / GLYCOLAX ) packet Take 17 g by mouth daily. (Patient taking differently: Take 17 g by mouth daily as needed for mild constipation.)   pravastatin  (PRAVACHOL ) 40 MG tablet Take 40 mg by mouth at bedtime.   tamsulosin  (FLOMAX ) 0.4 MG CAPS capsule Take 0.4 mg by mouth daily after supper.   vitamin B-12 (CYANOCOBALAMIN ) 1000 MCG tablet Take 1,000 mcg by mouth daily.    Allergies:   Azithromycin, Penicillins, Lisinopril, Losartan , and Metoprolol    Social History   Socioeconomic History   Marital status: Married    Spouse name: Gonzalo Lather   Number of children: 1   Years of education: Not on file   Highest education level: Not on file  Occupational History   Not on file  Tobacco Use   Smoking status: Former    Current packs/day: 0.00    Average packs/day: 0.3 packs/day for 29.0 years (7.3 ttl pk-yrs)    Types: Cigarettes    Start date: 12/31/1964    Quit date: 12/31/1993    Years since quitting: 30.3    Passive exposure: Past   Smokeless tobacco: Never  Vaping Use   Vaping status: Never Used  Substance and Sexual Activity   Alcohol  use: No    Alcohol /week: 0.0 standard drinks of alcohol    Drug use: No   Sexual activity: Not on file  Other Topics Concern   Not on file  Social History Narrative   Lives with [place], wife.    Social Drivers of Corporate investment banker Strain: Low Risk  (02/03/2024)   Received from [facility]   Overall Financial Resource Strain (CARDIA)    Difficulty of Paying Living Expenses: Not hard at all  Food Insecurity: No Food Insecurity (02/03/2024)   Received from [facility]   Hunger Vital Sign    Worried About Running Out of Food in the Last Year: Never true    Ran Out of Food in the Last Year: Never true  Transportation Needs: No Transportation Needs (02/03/2024)   Received from [facility]    In the past 12 months, has lack of  transportation kept you from medical appointments or from getting medications?: No    Lack of Transportation (Non-Medical): No  Physical Activity: Not on file  Stress: Not on file  Social Connections: Not on file     Family History:  The patient's family history includes Diabetes in his brother, brother, sister, and sister; Heart attack in his brother; Ovarian cancer in his sister.  ROS:   12-point review of systems is negative unless otherwise noted  in the HPI.   EKGs/Labs/Other Studies Reviewed:    Studies reviewed were summarized above. The additional studies were reviewed today:  [facility] 05/01/2024: Conclusions: Mild, non-obstructive coronary artery disease with 10-20% stenoses of the mid LAD and proximal LCx as well as 20-30% stenosis of the proximal RCA. Normal left and right heart filling pressures (mean RA 3, RV 42/5, PW 10, LVEDP 15 mmHg). Mild pulmonary hypertension (PA 39/10, mean 20 mmHg). Normal Fick cardiac output/index (CO 8.2 L/min, CI 3.3 L/min/m^2).   Recommendations: Continue medical therapy and risk factor modification to prevent progression of mild coronary artery disease. Maintain net even fluid balance. Escalate goal-directed medical therapy for treatment of nonischemic cardiomyopathy, as tolerated. If no evidence of bleeding or vascular injury, apixaban  should be restarted tomorrow morning. __________  2D echo 01/28/2024: 1. Left ventricular ejection fraction, by estimation, is 35 to 40%. The  left ventricle has moderately decreased function. The left ventricle  demonstrates global hypokinesis. The left ventricular internal cavity size  was mildly dilated. Left ventricular  diastolic parameters are consistent with Grade I diastolic dysfunction  (impaired relaxation).   2. Right ventricular systolic function is normal. The right ventricular  size is normal.   3. The mitral valve is normal in structure. No evidence of mitral valve  regurgitation. No evidence  of mitral stenosis.   4. The aortic valve is tricuspid. There is mild calcification of the  aortic valve. Aortic valve regurgitation is not visualized. No aortic  stenosis is present.   5. The inferior vena cava is normal in size with greater than 50%  respiratory variability, suggesting right atrial pressure of 3 mmHg.  __________   2D echo 12/06/2023 [facility]): Summary   1. Technically difficult study.    2. The left ventricle is mildly dilated in size with mildly increased wall  thickness.   3. The left ventricular systolic function is severely decreased, LVEF is  visually estimated at 25-30%.    4. There is mild mitral valve regurgitation.    5. The aortic valve is probably trileaflet with mildly thickened leaflets  with mildly reduced excursion.    6. The right ventricle is normal in size, with mildly reduced systolic  function.   7. IVC size and inspiratory change suggest elevated right atrial pressure.  (10-20 mmHg).  __________   2D echo 05/20/2023: 1. Left ventricular ejection fraction, by estimation, is 50 to 55%. The  left ventricle has low normal function. Left ventricular endocardial  border not optimally defined to evaluate regional wall motion. The left  ventricular internal cavity size was  mildly dilated. There is mild left ventricular hypertrophy. Left  ventricular diastolic parameters are indeterminate.   2. Right ventricular systolic function is normal. The right ventricular  size is normal. Tricuspid regurgitation signal is inadequate for assessing  PA pressure.   3. Left atrial size was mildly dilated.   4. Right atrial size was mildly dilated.   5. The mitral valve is normal in structure. No evidence of mitral valve  regurgitation. No evidence of mitral stenosis.   6. The aortic valve is normal in structure. Aortic valve regurgitation is  not visualized. Aortic valve sclerosis/calcification is present, without  any evidence of aortic stenosis.   7. The  inferior vena cava is normal in size with greater than 50%  respiratory variability, suggesting right atrial pressure of 3 mmHg.  __________   2D echo 02/24/2018: - Left ventricle: The cavity size was normal. Systolic function was  normal. The estimated ejection fraction was in the range of 55%    to 60%.  - Aortic valve: There was trivial regurgitation. Valve area (VTI):    2.13 cm^2. Valve area (Vmax): 2.61 cm^2. Valve area (Vmean): 2.22    cm^2.  - Mitral valve: Valve area by continuity equation (using LVOT    flow): 2.97 cm^2.  __________   2D echo 11/18/2015 [facility]): INTERPRETATION  NORMAL LEFT VENTRICULAR SYSTOLIC FUNCTION  NORMAL RIGHT VENTRICULAR SYSTOLIC FUNCTION  TRIVIAL REGURGITATION NOTED (See above)  NO VALVULAR STENOSIS  Closest EF: >55% (Estimated)  Mitral: TRIVIAL MR  Tricuspid: TRIVIAL TR  LVH: MILD LVH    EKG:  EKG is ordered today.  The EKG ordered today demonstrates NSR, 71 bpm, LBBB, first-degree AV block, rare PVC  Recent Labs: 05/19/2023: B Natriuretic Peptide 140.5 04/14/2024: ALT 13; Platelets 297; TSH 0.762 04/16/2024: BUN 17; Creatinine, Ser 1.47 05/01/2024: Hemoglobin 14.3; Potassium 3.9; Sodium 141  Recent Lipid Panel    Component Value Date/Time   CHOL 85 08/21/2012 0502   TRIG 74 08/21/2012 0502   HDL 26 (L) 08/21/2012 0502   VLDL 15 08/21/2012 0502   LDLCALC 44 08/21/2012 0502    PHYSICAL EXAM:    VS:  BP 130/62 (BP Location: Left Arm, Patient Position: Sitting, Cuff Size: Normal)   Pulse 71   Ht 6\' 4"  (1.93 m)   Wt 267 lb (121.1 kg)   SpO2 96%   BMI 32.50 kg/m   BMI: Body mass index is 32.5 kg/m.  Physical Exam Vitals reviewed.  Constitutional:      Appearance: He is well-developed.  HENT:     Head: Normocephalic and atraumatic.  Eyes:     General:        Right eye: No discharge.        Left eye: No discharge.  Neck:     Vascular: No JVD.  Cardiovascular:     Rate and Rhythm: Normal rate and regular rhythm.     Heart  sounds: Normal heart sounds, S1 normal and S2 normal. Heart sounds not distant. No midsystolic click and no opening snap. No murmur heard.    No friction rub.     Comments: Right radial arteriotomy site is well-healing without active bleeding, bruising, swelling, warmth, erythema, or tenderness to palpation.  Radial pulse 2+ proximal and distal to the arteriotomy site. Pulmonary:     Effort: Pulmonary effort is normal. No respiratory distress.     Breath sounds: Normal breath sounds. No decreased breath sounds, wheezing, rhonchi or rales.  Chest:     Chest wall: No tenderness.  Musculoskeletal:     Cervical back: Normal range of motion.     Right lower leg: No edema.     Left lower leg: No edema.  Skin:    General: Skin is warm and dry.     Nails: There is no clubbing.  Neurological:     Mental Status: He is alert and oriented to person, place, and time.  Psychiatric:        Speech: Speech normal.        Behavior: Behavior normal.        Thought Content: Thought content normal.        Judgment: Judgment normal.     Wt Readings from Last 3 Encounters:  05/08/24 267 lb (121.1 kg)  05/01/24 262 lb 9.6 oz (119.1 kg)  04/14/24 266 lb 12.8 oz (121 kg)     ASSESSMENT & PLAN:  HFrEF secondary to NICM with LBBB: Euvolemic and well compensated with NYHA class II symptoms.  Continue current GDMT including Jardiance  Toprol -XL 12.5 mg.  No longer on MRA, ARB/ACE inhibitor/ARNI secondary to hyperkalemia.  Not requiring a standing loop diuretic.  Moving forward, should cardiomyopathy persist on follow-up echo in 3 months would consider addition of BiDil.  Does not qualify for CRT with current cardiomyopathy with an EF of 40%.  Monitor first-degree AV block on beta-blocker.  Nonobstructive CAD: No symptoms suggestive of angina.  Continue aggressive risk factor modification and primary prevention including apixaban  in place of aspirin  given underlying A-fib along with Toprol -XL and pravastatin .   No indication for further ischemic testing at this time.  Persistent A-fib/flutter: Maintaining sinus rhythm on Toprol -XL 12.5 mg daily.  CHA2DS2-VASc at least 6 (CHF, HTN, age x 2, DM, vascular disease).  Remains on apixaban  5 mg twice daily and does not meet reduced dosing criteria.  No falls or symptoms concerning for bleeding.  Recent labs stable.  HTN: Blood pressure is well-controlled in the office today.  Continue pharmacotherapy as above.  HLD: LDL 72 in 01/2024.  Remains on pravastatin .  Hyperkalemia: No longer on MRA/ARB.  Check BMP.    Disposition: F/u with Dr. Gollan or an APP in 4 months.   Medication Adjustments/Labs and Tests Ordered: Current medicines are reviewed at length with the patient today.  Concerns regarding medicines are outlined above. Medication changes, Labs and Tests ordered today are summarized above and listed in the Patient Instructions accessible in Encounters.   Signed, Varney Gentleman, PA-C 05/08/2024 10:10 AM     [facility] [place] [address] [place], [place] [address] [phone]

## 2024-05-09 LAB — BASIC METABOLIC PANEL WITH GFR
BUN/Creatinine Ratio: 12 (ref 10–24)
BUN: 13 mg/dL (ref 8–27)
CO2: 22 mmol/L (ref 20–29)
Calcium: 10.1 mg/dL (ref 8.6–10.2)
Chloride: 100 mmol/L (ref 96–106)
Creatinine, Ser: 1.07 mg/dL (ref 0.76–1.27)
Glucose: 119 mg/dL — ABNORMAL HIGH (ref 70–99)
Potassium: 4.7 mmol/L (ref 3.5–5.2)
Sodium: 141 mmol/L (ref 134–144)
eGFR: 71 mL/min/{1.73_m2} (ref >59)

## 2024-05-18 ENCOUNTER — Other Ambulatory Visit

## 2024-06-30 ENCOUNTER — Telehealth: Payer: Self-pay | Admitting: Cardiovascular Disease

## 2024-06-30 MED ORDER — AMIODARONE HCL 200 MG PO TABS: 200.0000 mg | ORAL_TABLET | Freq: Every day | ORAL | 1 refills | Status: DC

## 2024-06-30 NOTE — Telephone Encounter (Signed)
*  STAT* If patient is at the pharmacy, call can be transferred to refill team.   1. Which medications need to be refilled? (please list name of each medication and dose if known) amiodarone  (PACERONE ) 200 MG tablet    2. Would you like to learn more about the convenience, safety, & potential cost savings by using the [facility]?     3. Are you open to using the [facility] (Type [facility].  ).   4. Which pharmacy/location (including street and city if local pharmacy) is medication to be sent to? [facility] - , [place] - [address]    5. Do they need a 30 day or 90 day supply? 90 day

## 2024-06-30 NOTE — Telephone Encounter (Signed)
 Filled

## 2024-07-29 ENCOUNTER — Other Ambulatory Visit: Payer: Self-pay | Admitting: Radiation Oncology

## 2024-08-03 ENCOUNTER — Inpatient Hospital Stay: Payer: Medicare HMO | Attending: Radiation Oncology

## 2024-08-03 DIAGNOSIS — C61 Malignant neoplasm of prostate: Secondary | ICD-10-CM | POA: Insufficient documentation

## 2024-08-03 LAB — CBC (CANCER CENTER ONLY)
HCT: 46.2 % (ref 39.0–52.0)
Hemoglobin: 14.6 g/dL (ref 13.0–17.0)
MCH: 25.5 pg — ABNORMAL LOW (ref 26.0–34.0)
MCHC: 31.6 g/dL (ref 30.0–36.0)
MCV: 80.8 fL (ref 80.0–100.0)
Platelet Count: 301 K/uL (ref 150–400)
RBC: 5.72 MIL/uL (ref 4.22–5.81)
RDW: 15.2 % (ref 11.5–15.5)
WBC Count: 5.2 K/uL (ref 4.0–10.5)
nRBC: 0 % (ref 0.0–0.2)

## 2024-08-03 LAB — PSA: Prostatic Specific Antigen: 0.06 ng/mL (ref 0.00–4.00)

## 2024-08-10 ENCOUNTER — Ambulatory Visit: Attending: Physician Assistant

## 2024-08-10 ENCOUNTER — Encounter: Payer: Self-pay | Admitting: Radiation Oncology

## 2024-08-10 ENCOUNTER — Ambulatory Visit
Admission: RE | Admit: 2024-08-10 | Discharge: 2024-08-10 | Disposition: A | Discharge status: Home / Self Care | Payer: Medicare HMO | Source: Ambulatory Visit | Attending: Radiation Oncology | Admitting: Radiation Oncology

## 2024-08-10 VITALS — BP 151/74 | HR 77 | Temp 97.0°F | Resp 16 | Ht 76.0 in | Wt 263.4 lb

## 2024-08-10 DIAGNOSIS — Z923 Personal history of irradiation: Secondary | ICD-10-CM | POA: Diagnosis not present

## 2024-08-10 DIAGNOSIS — C61 Malignant neoplasm of prostate: Secondary | ICD-10-CM | POA: Diagnosis present

## 2024-08-10 DIAGNOSIS — I5020 Unspecified systolic (congestive) heart failure: Secondary | ICD-10-CM | POA: Diagnosis not present

## 2024-08-10 LAB — ECHOCARDIOGRAM LIMITED
AV Mean grad: 3 mmHg
AV Peak grad: 5.3 mmHg
Ao pk vel: 1.15 m/s
Area-P 1/2: 5.38 cm2
Height: 76 in
S' Lateral: 5.24 cm
Weight: 4214.4 [oz_av]

## 2024-08-10 NOTE — Progress Notes (Signed)
 Radiation Oncology Follow up Note  Name: Evan [name]   Date:   08/10/2024 MRN:  [id number] DOB: [birth year]    This 79 y.o. male presents to the clinic today for 26-month follow-up status post image guided IMRT radiation therapy for Gleason 7 (4+3) adenocarcinoma the prostate.  REFERRING PROVIDER: Epifanio Alm SQUIBB, MD  HPI: Patient is a 79 year old male now out 10 months having completed image guided radiation therapy for Gleason 7 adenocarcinoma the prostate.  Seen today in routine follow-up he is doing well he specifically denies any increased lower Neri tract symptoms diarrhea or fatigue.  His most recent PSA is excellent at 0.06.  COMPLICATIONS OF TREATMENT: none  FOLLOW UP COMPLIANCE: keeps appointments   PHYSICAL EXAM:  BP (!) 151/74   Pulse 77   Temp (!) 97 F (36.1 C) (Tympanic)   Resp 16   Ht 6' 4 (1.93 m)   Wt 263 lb 6.4 oz (119.5 kg)   BMI 32.06 kg/m  Well-developed well-nourished patient in NAD. HEENT reveals PERLA, EOMI, discs not visualized.  Oral cavity is clear. No oral mucosal lesions are identified. Neck is clear without evidence of cervical or supraclavicular adenopathy. Lungs are clear to A&P. Cardiac examination is essentially unremarkable with regular rate and rhythm without murmur rub or thrill. Abdomen is benign with no organomegaly or masses noted. Motor sensory and DTR levels are equal and symmetric in the upper and lower extremities. Cranial nerves II through XII are grossly intact. Proprioception is intact. No peripheral adenopathy or edema is identified. No motor or sensory levels are noted. Crude visual fields are within normal range.  RADIOLOGY RESULTS: No current films for review  PLAN: At the present time patient is doing well under excellent biochemical control of his prostate cancer.  I am pleased with his overall progress.  I have asked to see him back in 6 months and then we will start once year follow-up visits.  Patient comprehends my  recommendations well.  I would like to take this opportunity to thank you for allowing me to participate in the care of your patient.SABRA Marcey Penton, MD

## 2024-08-10 NOTE — Progress Notes (Signed)
 Radiation Oncology Follow up Note  Name: Evan [name]   Date:   08/10/2024 MRN:  [id number] DOB: [birth year]    This 79 y.o. male presents to the clinic today for 26-month follow-up status post image guided IMRT radiation therapy for Gleason 7 (4+3) adenocarcinoma the prostate.  REFERRING PROVIDER: Epifanio Alm SQUIBB, MD  HPI: Patient is a 79 year old male now out 10 months having completed image guided radiation therapy for Gleason 7 adenocarcinoma the prostate.  Seen today in routine follow-up he is doing well he specifically denies any increased lower Neri tract symptoms diarrhea or fatigue.  His most recent PSA is excellent at 0.06.  COMPLICATIONS OF TREATMENT: none  FOLLOW UP COMPLIANCE: keeps appointments   PHYSICAL EXAM:  BP (!) 151/74   Pulse 77   Temp (!) 97 F (36.1 C) (Tympanic)   Resp 16   Ht 6' 4 (1.93 m)   Wt 263 lb 6.4 oz (119.5 kg)   BMI 32.06 kg/m  Well-developed well-nourished patient in NAD. HEENT reveals PERLA, EOMI, discs not visualized.  Oral cavity is clear. No oral mucosal lesions are identified. Neck is clear without evidence of cervical or supraclavicular adenopathy. Lungs are clear to A&P. Cardiac examination is essentially unremarkable with regular rate and rhythm without murmur rub or thrill. Abdomen is benign with no organomegaly or masses noted. Motor sensory and DTR levels are equal and symmetric in the upper and lower extremities. Cranial nerves II through XII are grossly intact. Proprioception is intact. No peripheral adenopathy or edema is identified. No motor or sensory levels are noted. Crude visual fields are within normal range.  RADIOLOGY RESULTS: No current films for review  PLAN: At the present time patient is doing well under excellent biochemical control of his prostate cancer.  I am pleased with his overall progress.  I have asked to see him back in 6 months and then we will start once year follow-up visits.  Patient comprehends my  recommendations well.  I would like to take this opportunity to thank you for allowing me to participate in the care of your patient.Evan Marcey Penton, MD

## 2024-08-12 ENCOUNTER — Ambulatory Visit: Payer: Self-pay | Admitting: Physician Assistant

## 2024-08-13 MED ORDER — ISOSORB DINITRATE-HYDRALAZINE 20-37.5 MG PO TABS: 1.0000 | ORAL_TABLET | Freq: Three times a day (TID) | ORAL | 3 refills | Status: DC

## 2024-08-17 NOTE — Telephone Encounter (Signed)
 Patient wanted to confirm medication instructions and review results. Reviewed results with him in detail and also discussed medication and instructions. He verbalized understanding, read back medication instructions, and had no further questions at this time.

## 2024-08-17 NOTE — Telephone Encounter (Signed)
 Pt returning call

## 2024-08-23 ENCOUNTER — Emergency Department

## 2024-08-23 ENCOUNTER — Inpatient Hospital Stay
Admission: EM | Admit: 2024-08-23 | Discharge: 2024-08-26 | DRG: 291 | Disposition: A | Discharge status: Home / Self Care | Attending: Student | Admitting: Student

## 2024-08-23 DIAGNOSIS — Z95828 Presence of other vascular implants and grafts: Secondary | ICD-10-CM | POA: Diagnosis not present

## 2024-08-23 DIAGNOSIS — Z7901 Long term (current) use of anticoagulants: Secondary | ICD-10-CM | POA: Diagnosis not present

## 2024-08-23 DIAGNOSIS — Z9842 Cataract extraction status, left eye: Secondary | ICD-10-CM

## 2024-08-23 DIAGNOSIS — I2510 Atherosclerotic heart disease of native coronary artery without angina pectoris: Secondary | ICD-10-CM | POA: Diagnosis present

## 2024-08-23 DIAGNOSIS — I447 Left bundle-branch block, unspecified: Secondary | ICD-10-CM | POA: Diagnosis present

## 2024-08-23 DIAGNOSIS — I5043 Acute on chronic combined systolic (congestive) and diastolic (congestive) heart failure: Secondary | ICD-10-CM | POA: Diagnosis present

## 2024-08-23 DIAGNOSIS — R0602 Shortness of breath: Secondary | ICD-10-CM

## 2024-08-23 DIAGNOSIS — E7800 Pure hypercholesterolemia, unspecified: Secondary | ICD-10-CM | POA: Diagnosis present

## 2024-08-23 DIAGNOSIS — Z888 Allergy status to other drugs, medicaments and biological substances status: Secondary | ICD-10-CM

## 2024-08-23 DIAGNOSIS — I110 Hypertensive heart disease with heart failure: Principal | ICD-10-CM | POA: Diagnosis present

## 2024-08-23 DIAGNOSIS — N401 Enlarged prostate with lower urinary tract symptoms: Secondary | ICD-10-CM | POA: Diagnosis not present

## 2024-08-23 DIAGNOSIS — I420 Dilated cardiomyopathy: Secondary | ICD-10-CM | POA: Diagnosis present

## 2024-08-23 DIAGNOSIS — Z833 Family history of diabetes mellitus: Secondary | ICD-10-CM

## 2024-08-23 DIAGNOSIS — M7989 Other specified soft tissue disorders: Secondary | ICD-10-CM | POA: Diagnosis present

## 2024-08-23 DIAGNOSIS — Z880 Allergy status to penicillin: Secondary | ICD-10-CM

## 2024-08-23 DIAGNOSIS — E66811 Obesity, class 1: Secondary | ICD-10-CM | POA: Diagnosis present

## 2024-08-23 DIAGNOSIS — Z8249 Family history of ischemic heart disease and other diseases of the circulatory system: Secondary | ICD-10-CM

## 2024-08-23 DIAGNOSIS — E1121 Type 2 diabetes mellitus with diabetic nephropathy: Secondary | ICD-10-CM | POA: Diagnosis present

## 2024-08-23 DIAGNOSIS — I2699 Other pulmonary embolism without acute cor pulmonale: Secondary | ICD-10-CM | POA: Diagnosis present

## 2024-08-23 DIAGNOSIS — Z9841 Cataract extraction status, right eye: Secondary | ICD-10-CM

## 2024-08-23 DIAGNOSIS — Z79899 Other long term (current) drug therapy: Secondary | ICD-10-CM

## 2024-08-23 DIAGNOSIS — I440 Atrioventricular block, first degree: Secondary | ICD-10-CM | POA: Diagnosis present

## 2024-08-23 DIAGNOSIS — Z86711 Personal history of pulmonary embolism: Secondary | ICD-10-CM

## 2024-08-23 DIAGNOSIS — Z86718 Personal history of other venous thrombosis and embolism: Secondary | ICD-10-CM

## 2024-08-23 DIAGNOSIS — Z96651 Presence of right artificial knee joint: Secondary | ICD-10-CM | POA: Diagnosis present

## 2024-08-23 DIAGNOSIS — I5A Non-ischemic myocardial injury (non-traumatic): Secondary | ICD-10-CM | POA: Diagnosis present

## 2024-08-23 DIAGNOSIS — H9190 Unspecified hearing loss, unspecified ear: Secondary | ICD-10-CM | POA: Diagnosis present

## 2024-08-23 DIAGNOSIS — G4733 Obstructive sleep apnea (adult) (pediatric): Secondary | ICD-10-CM | POA: Diagnosis present

## 2024-08-23 DIAGNOSIS — I509 Heart failure, unspecified: Principal | ICD-10-CM

## 2024-08-23 DIAGNOSIS — Z7984 Long term (current) use of oral hypoglycemic drugs: Secondary | ICD-10-CM | POA: Diagnosis not present

## 2024-08-23 DIAGNOSIS — I4819 Other persistent atrial fibrillation: Secondary | ICD-10-CM | POA: Diagnosis present

## 2024-08-23 DIAGNOSIS — I10 Essential (primary) hypertension: Secondary | ICD-10-CM | POA: Diagnosis present

## 2024-08-23 DIAGNOSIS — Z881 Allergy status to other antibiotic agents status: Secondary | ICD-10-CM

## 2024-08-23 DIAGNOSIS — N400 Enlarged prostate without lower urinary tract symptoms: Secondary | ICD-10-CM | POA: Diagnosis present

## 2024-08-23 DIAGNOSIS — I5023 Acute on chronic systolic (congestive) heart failure: Secondary | ICD-10-CM | POA: Diagnosis present

## 2024-08-23 DIAGNOSIS — J449 Chronic obstructive pulmonary disease, unspecified: Secondary | ICD-10-CM | POA: Diagnosis present

## 2024-08-23 DIAGNOSIS — I82409 Acute embolism and thrombosis of unspecified deep veins of unspecified lower extremity: Secondary | ICD-10-CM | POA: Diagnosis present

## 2024-08-23 DIAGNOSIS — Z96641 Presence of right artificial hip joint: Secondary | ICD-10-CM | POA: Diagnosis present

## 2024-08-23 DIAGNOSIS — I480 Paroxysmal atrial fibrillation: Secondary | ICD-10-CM | POA: Diagnosis present

## 2024-08-23 DIAGNOSIS — Z860101 Personal history of adenomatous and serrated colon polyps: Secondary | ICD-10-CM

## 2024-08-23 DIAGNOSIS — Z961 Presence of intraocular lens: Secondary | ICD-10-CM | POA: Diagnosis present

## 2024-08-23 DIAGNOSIS — Z6832 Body mass index (BMI) 32.0-32.9, adult: Secondary | ICD-10-CM

## 2024-08-23 DIAGNOSIS — E669 Obesity, unspecified: Secondary | ICD-10-CM | POA: Diagnosis not present

## 2024-08-23 DIAGNOSIS — Z87891 Personal history of nicotine dependence: Secondary | ICD-10-CM

## 2024-08-23 DIAGNOSIS — Z8546 Personal history of malignant neoplasm of prostate: Secondary | ICD-10-CM

## 2024-08-23 DIAGNOSIS — Z923 Personal history of irradiation: Secondary | ICD-10-CM

## 2024-08-23 DIAGNOSIS — Z9049 Acquired absence of other specified parts of digestive tract: Secondary | ICD-10-CM

## 2024-08-23 LAB — CBC
HCT: 44.4 % (ref 39.0–52.0)
Hemoglobin: 13.8 g/dL (ref 13.0–17.0)
MCH: 25.1 pg — ABNORMAL LOW (ref 26.0–34.0)
MCHC: 31.1 g/dL (ref 30.0–36.0)
MCV: 80.9 fL (ref 80.0–100.0)
Platelets: 330 K/uL (ref 150–400)
RBC: 5.49 MIL/uL (ref 4.22–5.81)
RDW: 15.9 % — ABNORMAL HIGH (ref 11.5–15.5)
WBC: 7.5 K/uL (ref 4.0–10.5)
nRBC: 0 % (ref 0.0–0.2)

## 2024-08-23 LAB — BASIC METABOLIC PANEL WITH GFR
Anion gap: 8 (ref 5–15)
BUN: 12 mg/dL (ref 8–23)
CO2: 25 mmol/L (ref 22–32)
Calcium: 9.4 mg/dL (ref 8.9–10.3)
Chloride: 106 mmol/L (ref 98–111)
Creatinine, Ser: 0.93 mg/dL (ref 0.61–1.24)
GFR, Estimated: >60 mL/min (ref >60)
Glucose, Bld: 132 mg/dL — ABNORMAL HIGH (ref 70–99)
Potassium: 4.2 mmol/L (ref 3.5–5.1)
Sodium: 139 mmol/L (ref 135–145)

## 2024-08-23 LAB — BRAIN NATRIURETIC PEPTIDE: B Natriuretic Peptide: 544 pg/mL — ABNORMAL HIGH (ref 0.0–100.0)

## 2024-08-23 LAB — TROPONIN I (HIGH SENSITIVITY)
Troponin I (High Sensitivity): 20 ng/L — ABNORMAL HIGH (ref <18)
Troponin I (High Sensitivity): 36 ng/L — ABNORMAL HIGH (ref <18)

## 2024-08-23 LAB — CBG MONITORING, ED: Glucose-Capillary: 122 mg/dL — ABNORMAL HIGH (ref 70–99)

## 2024-08-23 MED ORDER — INSULIN ASPART 100 UNIT/ML IJ SOLN
0.0000 [IU] | Freq: Three times a day (TID) | INTRAMUSCULAR | Status: DC
  Administered 2024-08-24: 2 [IU] via SUBCUTANEOUS
  Administered 2024-08-25: 1 [IU] via SUBCUTANEOUS
  Administered 2024-08-26: 3 [IU] via SUBCUTANEOUS
  Filled 2024-08-23 (×3): qty 1

## 2024-08-23 MED ORDER — ALBUTEROL SULFATE (2.5 MG/3ML) 0.083% IN NEBU: 2.5000 mg | INHALATION_SOLUTION | RESPIRATORY_TRACT | Status: DC | PRN

## 2024-08-23 MED ORDER — ONDANSETRON HCL 4 MG/2ML IJ SOLN: 4.0000 mg | Freq: Three times a day (TID) | INTRAMUSCULAR | Status: DC | PRN

## 2024-08-23 MED ORDER — FUROSEMIDE 10 MG/ML IJ SOLN
60.0000 mg | Freq: Once | INTRAMUSCULAR | Status: AC
  Administered 2024-08-23: 60 mg via INTRAVENOUS
  Filled 2024-08-23: qty 8

## 2024-08-23 MED ORDER — ACETAMINOPHEN 325 MG PO TABS: 650.0000 mg | ORAL_TABLET | Freq: Four times a day (QID) | ORAL | Status: DC | PRN

## 2024-08-23 MED ORDER — DM-GUAIFENESIN ER 30-600 MG PO TB12: 1.0000 | ORAL_TABLET | Freq: Two times a day (BID) | ORAL | Status: DC | PRN

## 2024-08-23 MED ORDER — HYDRALAZINE HCL 20 MG/ML IJ SOLN: 5.0000 mg | INTRAMUSCULAR | Status: DC | PRN

## 2024-08-23 MED ORDER — INSULIN ASPART 100 UNIT/ML IJ SOLN: 0.0000 [IU] | Freq: Every day | INTRAMUSCULAR | Status: DC

## 2024-08-23 NOTE — ED Triage Notes (Signed)
 Pt presents to the ED via [facility] from home. Pt reports chest tightness and SHOB that started about an hour ago. Pt had a recent diagnosis of afib. Pt was sinus tach with EMS. Hx of CHF.   BP 178/85 HR 98 96% RA

## 2024-08-23 NOTE — ED Provider Notes (Signed)
 [facility] Provider Note    Event Date/Time   First MD Initiated Contact with Patient 08/23/24 2128     (approximate)  History   Chief Complaint: Dyspnea  HPI  LOVELACE CERVENY is a 79 y.o. male with a past medical history of COPD, diabetes, hypertension, hyperlipidemia, CHF, presents to the emergency department for worsening shortness of breath.  According to the patient since this morning he has been feeling somewhat short of breath which acutely worsened around 4:00 this afternoon.  Patient states slight chest tightness but no pain.  No fever.  Physical Exam   Triage Vital Signs: ED Triage Vitals [08/23/24 1823]  Encounter Vitals Group     BP (!) 162/74     Girls Systolic BP Percentile      Girls Diastolic BP Percentile      Boys Systolic BP Percentile      Boys Diastolic BP Percentile      Pulse Rate 89     Resp 18     Temp 98.1 F (36.7 C)     Temp Source Oral     SpO2 95 %     Weight 268 lb 9.6 oz (121.8 kg)     Height 6' 4 (1.93 m)     Head Circumference      Peak Flow      Pain Score 0     Pain Loc      Pain Education      Exclude from Growth Chart     Most recent vital signs: Vitals:   08/23/24 1823  BP: (!) 162/74  Pulse: 89  Resp: 18  Temp: 98.1 F (36.7 C)  SpO2: 95%    General: Awake, no distress.  CV:  Good peripheral perfusion.  Regular rate and rhythm  Resp:  Normal effort.  Equal breath sounds bilaterally.  Abd:  No distention.  Soft, nontender.  No rebound or guarding.  ED Results / Procedures / Treatments   EKG  EKG viewed and interpreted by myself shows a sinus rhythm 89 bpm with a widened QRS, normal axis, slight PR prolongation otherwise normal intervals with nonspecific ST changes.  Left bundle branch block.  RADIOLOGY  I have reviewed interpret the chest x-ray images.  Patient has opacities bilaterally that looks consistent with pulmonary edema. Radiology has read the x-ray as cardiac enlargement of  vascular congestion and perihilar infiltrates concerning for edema   MEDICATIONS ORDERED IN ED: Medications  furosemide  (LASIX ) injection 60 mg (has no administration in time range)  albuterol  (VENTOLIN  HFA) 108 (90 Base) MCG/ACT inhaler 2 puff (has no administration in time range)  dextromethorphan-guaiFENesin  (MUCINEX  DM) 30-600 MG per 12 hr tablet 1 tablet (has no administration in time range)  ondansetron  (ZOFRAN ) injection 4 mg (has no administration in time range)  hydrALAZINE  (APRESOLINE ) injection 5 mg (has no administration in time range)  acetaminophen  (TYLENOL ) tablet 650 mg (has no administration in time range)  insulin  aspart (novoLOG ) injection 0-9 Units (has no administration in time range)  insulin  aspart (novoLOG ) injection 0-5 Units (has no administration in time range)     IMPRESSION / MDM / ASSESSMENT AND PLAN / ED COURSE  I reviewed the triage vital signs and the nursing notes.  Patient's presentation is most consistent with acute presentation with potential threat to life or bodily function.  Patient presents to the emergency department for shortness of breath since this morning worsening this afternoon.  Patient satting around 92 to 95% on  room air with no baseline O2 requirement.  Patient's lab work today shows a reassuring CBC.  Reassuring chemistry.  Patient's troponin is slightly elevated however unchanged from historical values.  BNP is elevated currently 540 from 140 on last check.  Chest x-ray shows signs of CHF/pulmonary edema.  Highly suspect pulmonary edema to be the cause of patient's worsening shortness of breath.  Will dose 60 mg of IV Lasix .  Will admit to the hospital service for further workup and treatment.  FINAL CLINICAL IMPRESSION(S) / ED DIAGNOSES   Dyspnea CHF exacerbation  Note:  This document was prepared using Dragon voice recognition software and may include unintentional dictation errors.   Dorothyann Drivers, MD 08/23/24 2158

## 2024-08-23 NOTE — H&P (Signed)
 History and Physical    Evan [name] FMW:[id number] DOB: [birth year] DOA: 08/23/2024  Referring MD/NP/PA:   PCP: Epifanio Alm SQUIBB, MD   Patient coming from:  The patient is coming from home.     Chief Complaint: SOB  HPI: Evan [name] is a 79 y.o. male with medical history significant of sCHF with EF20-25%, HTN, HLD, DM, COPD, GIB, BPH, A fib and PE/DVT on Eliquis , OSA on CPAP, obesity, who presents with SOB.  Patient states that  his SOB started this morning which has been progressively worsening, associated with chest tightness, no active chest pain. No cough, fever or chills.  No nausea, vomiting, diarrhea or abdominal pain.  No symptoms of UTI.  Patient states that he has gained 5 pounds recently  Data reviewed independently and ED Course: pt was found to have BNP 544, GFR> 60, WBC 7.5, troponin 20, temperature normal, blood pressure 162/74, heart rate 89, RR 18, oxygen sat 92-95% on room air.  Chest x-ray showed cardiomegaly and vascular congestion.  Patient is admitted to telemetry bed as an inpatient.  EKG: I have personally reviewed.  Sinus rhythm, QTc 486, poor R wave progression, anteroseptal infarction pattern, first-degree AV block.   Review of Systems:   General: no fevers, chills, has body weight gain, has fatigue HEENT: no blurry vision, hearing changes or sore throat Respiratory: has dyspnea, no coughing, no wheezing CV: no chest pain, no palpitations GI: no nausea, vomiting, abdominal pain, diarrhea, constipation GU: no dysuria, burning on urination, increased urinary frequency, hematuria  Ext: has leg edema Neuro: no unilateral weakness, numbness, or tingling, no vision change or hearing loss Skin: no rash, no skin tear. MSK: No muscle spasm, no deformity, no limitation of range of movement in spin Heme: No easy bruising.  Travel history: No recent long distant travel.   Allergy:  Allergies  Allergen Reactions   Azithromycin Rash   Penicillins  Rash   Lisinopril Cough   Losartan  Cough   Metoprolol  Other (See Comments) and Rash    Bradycardia.    Past Medical History:  Diagnosis Date   Adenomatous polyp of colon    Arthritis    xDJD   Atrial fibrillation (HCC)    BPH (benign prostatic hyperplasia)    CHF (congestive heart failure) (HCC)    COPD (chronic obstructive pulmonary disease) (HCC)    Diabetes mellitus without complication (HCC)    type 2   Discitis    Diverticulitis    Diverticulosis    DVT (deep venous thrombosis) (HCC)    bilateral tibial   Erectile dysfunction    Hearing loss    Hyperlipidemia    Hypertension    Ischemic optic neuropathy    Lower GI bleeding 02/23/2018   Lumbar degenerative disc disease    Non-ischemic cardiomyopathy (HCC)    Sleep apnea     Past Surgical History:  Procedure Laterality Date   ATRIAL ABLATION SURGERY  2010   CATARACT EXTRACTION W/PHACO Right 10/10/2020   Procedure: CATARACT EXTRACTION PHACO AND INTRAOCULAR LENS PLACEMENT (IOC) RIGHT ISTENT INJ DIABETIC 2.64  00:29.4;  Surgeon: Myrna Adine Anes, MD;  Location: [facility];  Service: Ophthalmology;  Laterality: Right;  Diabetic - oral meds   CATARACT EXTRACTION W/PHACO Left 03/06/2021   Procedure: CATARACT EXTRACTION PHACO AND INTRAOCULAR LENS PLACEMENT (IOC) LEFT DIABETIC ISTENT INJ 3.62 00:30.6;  Surgeon: Myrna Adine Anes, MD;  Location: [facility];  Service: Ophthalmology;  Laterality: Left;  Diabetic - oral meds  CHOLECYSTECTOMY  2011   COLONOSCOPY Left 02/24/2018   Procedure: COLONOSCOPY;  Surgeon: Janalyn Keene NOVAK, MD;  Location: [facility] ENDOSCOPY;  Service: Endoscopy;  Laterality: Left;   COLONOSCOPY     2008, 2010, 2013   COLONOSCOPY N/A 04/06/2023   Procedure: COLONOSCOPY;  Surgeon: Toledo, Ladell POUR, MD;  Location: [facility] ENDOSCOPY;  Service: Gastroenterology;  Laterality: N/A;   COLONOSCOPY WITH PROPOFOL  N/A 09/25/2017   Procedure: COLONOSCOPY WITH PROPOFOL ;  Surgeon: Viktoria Lamar DASEN,  MD;  Location: [facility] ENDOSCOPY;  Service: Endoscopy;  Laterality: N/A;   COLONOSCOPY WITH PROPOFOL  N/A 02/18/2018   Procedure: COLONOSCOPY WITH PROPOFOL ;  Surgeon: Jinny Carmine, MD;  Location: [facility] ENDOSCOPY;  Service: Endoscopy;  Laterality: N/A;   ERCP  2011   ESOPHAGOGASTRODUODENOSCOPY Left 02/23/2018   Procedure: ESOPHAGOGASTRODUODENOSCOPY (EGD);  Surgeon: Janalyn Keene NOVAK, MD;  Location: [facility] ENDOSCOPY;  Service: Endoscopy;  Laterality: Left;   ESOPHAGOGASTRODUODENOSCOPY  2014   IVC FILTER INSERTION N/A 04/17/2022   Procedure: IVC FILTER INSERTION;  Surgeon: Jama Cordella MATSU, MD;  Location: [facility] INVASIVE CV LAB;  Service: Cardiovascular;  Laterality: N/A;   IVC FILTER REMOVAL N/A 07/31/2022   Procedure: IVC FILTER REMOVAL;  Surgeon: Jama Cordella MATSU, MD;  Location: [facility] INVASIVE CV LAB;  Service: Cardiovascular;  Laterality: N/A;   LAPAROTOMY  01/01/2017   Procedure: EXPLORATORY LAPAROTOMY;  Surgeon: Aloysius Plant, MD;  Location: [facility] ORS;  Service: General;;   PARTIAL COLECTOMY N/A 01/01/2017   Procedure: PARTIAL COLECTOMY;  Surgeon: Aloysius Plant, MD;  Location: [facility] ORS;  Service: General;  Laterality: N/A;   PERIPHERAL VASCULAR CATHETERIZATION N/A 12/28/2016   Procedure: Visceral Artery Intervention;  Surgeon: Selinda GORMAN Gu, MD;  Location: [facility] INVASIVE CV LAB;  Service: Cardiovascular;  Laterality: N/A;   RIGHT/LEFT HEART CATH AND CORONARY ANGIOGRAPHY Bilateral 05/01/2024   Procedure: RIGHT/LEFT HEART CATH AND CORONARY ANGIOGRAPHY;  Surgeon: Mady Bruckner, MD;  Location: [facility] INV[facility] CV LAB;  Service: Cardiovascular;  Laterality: Bilateral;   SECONDARY CLOSURE OF WOUND N/A 01/08/2017   Procedure: SECONDARY CLOSURE FOR EVISCERATION;  Surgeon: Charlie FORBES Fell, MD;  Location: [facility] ORS;  Service: General;  Laterality: N/A;   SIGMOIDECTOMY  2018   TOTAL HIP ARTHROPLASTY Right 04/24/2022   Procedure: TOTAL HIP ARTHROPLASTY ANTERIOR APPROACH;  Surgeon: Kathlynn Sharper, MD;  Location: [facility]  ORS;  Service: Orthopedics;  Laterality: Right;   TOTAL KNEE ARTHROPLASTY Right 03/24/2014   times 4    Social History:  reports that he quit smoking about 30 years ago. His smoking use included cigarettes. He started smoking about 59 years ago. He has a 7.3 pack-year smoking history. He has been exposed to tobacco smoke. He has never used smokeless tobacco. He reports that he does not drink alcohol  and does not use drugs.  Family History:  Family History  Problem Relation Age of Onset   Diabetes Sister    Ovarian cancer Sister    Diabetes Sister    Diabetes Brother    Heart attack Brother    Diabetes Brother      Prior to Admission medications   Medication Sig Start Date End Date Taking? Authorizing Provider  acetaminophen  (TYLENOL ) 500 MG tablet Take 1,000 mg by mouth every 8 (eight) hours as needed.    [provider]  albuterol  (VENTOLIN  HFA) 108 (90 Base) MCG/ACT inhaler Inhale 2 puffs into the lungs every 6 (six) hours as needed for shortness of breath or wheezing. 07/25/22   [provider]  amiodarone  (PACERONE ) 200 MG tablet Take 1 tablet (  200 mg total) by mouth daily. 06/30/24   Gollan, Timothy J, MD  apixaban  (ELIQUIS ) 5 MG TABS tablet Take 1 tablet (5 mg total) by mouth 2 (two) times daily. 05/28/23   Wouk, Devaughn Sayres, MD  ARNUITY ELLIPTA 100 MCG/ACT AEPB Inhale 1 puff into the lungs daily. 07/09/23   [provider]  Cholecalciferol  (VITAMIN D3) 2000 units capsule Take 2,000 Units by mouth daily.    [provider]  hydrOXYzine  (ATARAX /VISTARIL ) 25 MG tablet Take 25 mg by mouth at bedtime as needed (sleep).    [provider]  isosorbide -hydrALAZINE  (BIDIL ) 20-37.5 MG tablet Take 1 tablet by mouth 3 (three) times daily. 08/13/24   Dunn, Bernardino HERO, PA-C  JARDIANCE  25 MG TABS tablet Take 25 mg by mouth daily.    [provider]  metFORMIN  (GLUCOPHAGE -XR) 500 MG 24 hr tablet Take 2 tablets (1,000 mg total) by mouth daily with  breakfast. 05/04/24   End, Lonni, MD  metoprolol  succinate (TOPROL -XL) 25 MG 24 hr tablet Take 0.5 tablets (12.5 mg total) by mouth daily. 04/14/24   Abigail Bernardino HERO, PA-C  Multiple Vitamins-Minerals (CENTRUM SILVER PO) Take 1 tablet by mouth every morning.    [provider]  polyethylene glycol (MIRALAX  / GLYCOLAX ) packet Take 17 g by mouth daily. Patient taking differently: Take 17 g by mouth daily as needed for mild constipation. 02/25/18   Gouru, Aruna, MD  pravastatin  (PRAVACHOL ) 40 MG tablet Take 40 mg by mouth at bedtime. 05/03/16   [provider]  tamsulosin  (FLOMAX ) 0.4 MG CAPS capsule Take 0.4 mg by mouth daily after supper.    [provider]  vitamin B-12 (CYANOCOBALAMIN ) 1000 MCG tablet Take 1,000 mcg by mouth daily.    [provider]    Physical Exam: Vitals:   08/23/24 2230 08/23/24 2300 08/23/24 2312 08/24/24 0001  BP: (!) 148/71 (!) 143/70  (!) 144/106  Pulse: 74 68  81  Resp: 17 16  20   Temp:   98.4 F (36.9 C) 98.4 F (36.9 C)  TempSrc:   Oral Oral  SpO2: 92% 92%  94%  Weight:      Height:       General: Not in acute distress HEENT:       Eyes: PERRL, EOMI, no jaundice       ENT: No discharge from the ears and nose, no pharynx injection, no tonsillar enlargement.        Neck: Difficult to assess JVD due to obesity, no bruit, no mass felt. Heme: No neck lymph node enlargement. Cardiac: S1/S2, RRR, No murmurs, No gallops or rubs. Respiratory: Has fine crackles bilaterally GI: Soft, nondistended, nontender, no rebound pain, no organomegaly, BS present. GU: No hematuria Ext: has trace leg edema bilaterally. 1+DP/PT pulse bilaterally. Musculoskeletal: No joint deformities, No joint redness or warmth, no limitation of ROM in spin. Skin: No rashes.  Neuro: Alert, oriented X3, cranial nerves II-XII grossly intact, moves all extremities normally.  Psych: Patient is not psychotic, no suicidal or hemocidal ideation.  Labs on  Admission: I have personally reviewed following labs and imaging studies  CBC: Recent Labs  Lab 08/23/24 1828  WBC 7.5  HGB 13.8  HCT 44.4  MCV 80.9  PLT 330   Basic Metabolic Panel: Recent Labs  Lab 08/23/24 1828  NA 139  K 4.2  CL 106  CO2 25  GLUCOSE 132*  BUN 12  CREATININE 0.93  CALCIUM  9.4   GFR: Estimated Creatinine Clearance: 90.3 mL/min (by  C-G formula based on SCr of 0.93 mg/dL). Liver Function Tests: No results for input(s): AST, ALT, ALKPHOS, BILITOT, PROT, ALBUMIN in the last 168 hours. No results for input(s): LIPASE, AMYLASE in the last 168 hours. No results for input(s): AMMONIA in the last 168 hours. Coagulation Profile: No results for input(s): INR, PROTIME in the last 168 hours. Cardiac Enzymes: No results for input(s): CKTOTAL, CKMB, CKMBINDEX, TROPONINI in the last 168 hours. BNP (last 3 results) No results for input(s): PROBNP in the last 8760 hours. HbA1C: No results for input(s): HGBA1C in the last 72 hours. CBG: Recent Labs  Lab 08/23/24 2312  GLUCAP 122*   Lipid Profile: No results for input(s): CHOL, HDL, LDLCALC, TRIG, CHOLHDL, LDLDIRECT in the last 72 hours. Thyroid Function Tests: No results for input(s): TSH, T4TOTAL, FREET4, T3FREE, THYROIDAB in the last 72 hours. Anemia Panel: No results for input(s): VITAMINB12, FOLATE, FERRITIN, TIBC, IRON , RETICCTPCT in the last 72 hours. Urine analysis:    Component Value Date/Time   COLORURINE YELLOW (A) 04/12/2022 0948   APPEARANCEUR CLEAR (A) 04/12/2022 0948   APPEARANCEUR Clear 03/10/2014 0744   LABSPEC 1.016 04/12/2022 0948   LABSPEC 1.008 03/10/2014 0744   PHURINE 5.0 04/12/2022 0948   GLUCOSEU >=500 (A) 04/12/2022 0948   GLUCOSEU Negative 03/10/2014 0744   HGBUR NEGATIVE 04/12/2022 0948   BILIRUBINUR NEGATIVE 04/12/2022 0948   BILIRUBINUR Negative 03/10/2014 0744   KETONESUR NEGATIVE 04/12/2022 0948    PROTEINUR NEGATIVE 04/12/2022 0948   NITRITE NEGATIVE 04/12/2022 0948   LEUKOCYTESUR NEGATIVE 04/12/2022 0948   LEUKOCYTESUR Trace 03/10/2014 0744   Sepsis Labs: @LABRCNTIP (procalcitonin:4,lacticidven:4) )No results found for this or any previous visit (from the past 240 hours).   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  2D echo 08/10/2024 showed EF 20-25%.  Oxygen saturation 92-95% on room air.  -Will admit to tele bed as inpatient -Lasix  40 mg bid by IV (patient received 60 mg of Lasix  in ED -Continue Bidil  -Daily weights -strict I/O's -Low salt diet -Fluid restriction -As needed bronchodilators for shortness of breath  Myocardial injury: trop  20. No CP, likely demand ischemia. - Trend troponin - Metoprolol , Prevacid 90  PAF (paroxysmal atrial fibrillation) (HCC): Heart rate 80s -Continue metoprolol , amiodarone  - Eliquis   DVT (deep venous thrombosis) (HCC) and Pulmonary embolism (HCC) -On Eliquis   Hypercholesteremia -Pravastatin   Hypertension -IV hydralazine  as needed - Patient is on metoprolol , BiDil , IV Lasix   Stage 2 moderate COPD by GOLD classification (HCC): Stable, no wheezing - Bronchodilators as needed Mucinex   Controlled type 2 diabetes mellitus with proteinuric  diabetic nephropathy (HCC): Recent A1c 7.0.  Patient is on Jardiance  -SSI  BPH (benign prostatic hyperplasia) -Flomax   Obesity (BMI 30-39.9): Patient has Obesity Class I, with body weight 121.8 Kg and BMI 32.7 kg/m2.  - Encourage losing weight - Exercise and healthy diet  OSA -  on CPAP       DVT ppx: on Eliquis    Code Status: Full code   Family Communication:     not done, no family member is at bed side.      Disposition Plan:  Anticipate discharge back to previous environment  Consults called:  none  Admission status and Level of care: Telemetry Cardiac:   as inpt        Dispo: The patient is from: Home              Anticipated d/c is to: Home              Anticipated d/c date is: 2 days              Patient currently is not medically stable to d/c.    Severity of Illness:  The appropriate patient status for this patient is INPATIENT. Inpatient status is judged to be reasonable and necessary in order to provide the required intensity of service to ensure the patient's safety. The patient's presenting symptoms, physical exam findings, and initial radiographic and laboratory data in the context of their chronic comorbidities is felt to place them at high risk for further clinical deterioration. Furthermore, it is not anticipated that the patient will be medically stable for discharge from the hospital within 2 midnights of admission.   * I certify that at the point of admission it is my clinical judgment that the patient will require inpatient hospital care spanning beyond 2 midnights from the point of admission due to high intensity of service, high risk for further deterioration and high frequency of surveillance required.*       Date of Service 08/24/2024    Caleb Exon [facility]   If 7PM-7AM, please contact night-coverage [email or web address] 08/24/2024, 1:07 AM

## 2024-08-24 ENCOUNTER — Other Ambulatory Visit: Payer: Self-pay

## 2024-08-24 DIAGNOSIS — I5023 Acute on chronic systolic (congestive) heart failure: Secondary | ICD-10-CM | POA: Diagnosis not present

## 2024-08-24 DIAGNOSIS — I480 Paroxysmal atrial fibrillation: Secondary | ICD-10-CM | POA: Diagnosis not present

## 2024-08-24 DIAGNOSIS — I10 Essential (primary) hypertension: Secondary | ICD-10-CM | POA: Diagnosis not present

## 2024-08-24 LAB — BASIC METABOLIC PANEL WITH GFR
Anion gap: 10 (ref 5–15)
BUN: 13 mg/dL (ref 8–23)
CO2: 26 mmol/L (ref 22–32)
Calcium: 9.4 mg/dL (ref 8.9–10.3)
Chloride: 104 mmol/L (ref 98–111)
Creatinine, Ser: 1.04 mg/dL (ref 0.61–1.24)
GFR, Estimated: >60 mL/min (ref >60)
Glucose, Bld: 108 mg/dL — ABNORMAL HIGH (ref 70–99)
Potassium: 3.7 mmol/L (ref 3.5–5.1)
Sodium: 140 mmol/L (ref 135–145)

## 2024-08-24 LAB — TROPONIN I (HIGH SENSITIVITY)
Troponin I (High Sensitivity): 33 ng/L — ABNORMAL HIGH (ref <18)
Troponin I (High Sensitivity): 33 ng/L — ABNORMAL HIGH (ref <18)

## 2024-08-24 LAB — MAGNESIUM: Magnesium: 2.3 mg/dL (ref 1.7–2.4)

## 2024-08-24 LAB — GLUCOSE, CAPILLARY
Glucose-Capillary: 102 mg/dL — ABNORMAL HIGH (ref 70–99)
Glucose-Capillary: 181 mg/dL — ABNORMAL HIGH (ref 70–99)
Glucose-Capillary: 104 mg/dL — ABNORMAL HIGH (ref 70–99)
Glucose-Capillary: 102 mg/dL — ABNORMAL HIGH (ref 70–99)

## 2024-08-24 MED ORDER — POLYETHYLENE GLYCOL 3350 17 G PO PACK: 17.0000 g | PACK | Freq: Every day | ORAL | Status: DC | PRN

## 2024-08-24 MED ORDER — BUDESONIDE 0.25 MG/2ML IN SUSP
0.2500 mg | Freq: Two times a day (BID) | RESPIRATORY_TRACT | Status: DC
  Administered 2024-08-24 – 2024-08-26 (×5): 0.25 mg via RESPIRATORY_TRACT
  Filled 2024-08-24 (×5): qty 2

## 2024-08-24 MED ORDER — TAMSULOSIN HCL 0.4 MG PO CAPS
0.4000 mg | ORAL_CAPSULE | Freq: Every day | ORAL | Status: DC
  Administered 2024-08-24 – 2024-08-25 (×2): 0.4 mg via ORAL
  Filled 2024-08-24 (×2): qty 1

## 2024-08-24 MED ORDER — VITAMIN B-12 1000 MCG PO TABS
1000.0000 ug | ORAL_TABLET | Freq: Every day | ORAL | Status: DC
  Administered 2024-08-24 – 2024-08-26 (×3): 1000 ug via ORAL
  Filled 2024-08-24 (×3): qty 1

## 2024-08-24 MED ORDER — VITAMIN D 25 MCG (1000 UNIT) PO TABS
2000.0000 [IU] | ORAL_TABLET | Freq: Every day | ORAL | Status: DC
  Administered 2024-08-24 – 2024-08-26 (×3): 2000 [IU] via ORAL
  Filled 2024-08-24 (×3): qty 2

## 2024-08-24 MED ORDER — METOPROLOL SUCCINATE ER 25 MG PO TB24
12.5000 mg | ORAL_TABLET | Freq: Every day | ORAL | Status: DC
Start: 2024-08-24 — End: 2024-08-26
  Administered 2024-08-24 – 2024-08-26 (×3): 12.5 mg via ORAL
  Filled 2024-08-24 (×3): qty 1

## 2024-08-24 MED ORDER — ADULT MULTIVITAMIN W/MINERALS CH
1.0000 | ORAL_TABLET | Freq: Every morning | ORAL | Status: DC
  Administered 2024-08-24 – 2024-08-26 (×3): 1 via ORAL
  Filled 2024-08-24 (×3): qty 1

## 2024-08-24 MED ORDER — ISOSORB DINITRATE-HYDRALAZINE 20-37.5 MG PO TABS
1.0000 | ORAL_TABLET | Freq: Three times a day (TID) | ORAL | Status: DC
  Administered 2024-08-24 – 2024-08-26 (×7): 1 via ORAL
  Filled 2024-08-24 (×8): qty 1

## 2024-08-24 MED ORDER — AMIODARONE HCL 200 MG PO TABS
200.0000 mg | ORAL_TABLET | Freq: Every day | ORAL | Status: DC
  Administered 2024-08-24 – 2024-08-26 (×3): 200 mg via ORAL
  Filled 2024-08-24 (×3): qty 1

## 2024-08-24 MED ORDER — PRAVASTATIN SODIUM 40 MG PO TABS
40.0000 mg | ORAL_TABLET | Freq: Every day | ORAL | Status: DC
  Administered 2024-08-24 – 2024-08-25 (×3): 40 mg via ORAL
  Filled 2024-08-24 (×3): qty 1

## 2024-08-24 MED ORDER — FUROSEMIDE 10 MG/ML IJ SOLN
40.0000 mg | Freq: Two times a day (BID) | INTRAMUSCULAR | Status: DC
  Administered 2024-08-24 – 2024-08-26 (×5): 40 mg via INTRAVENOUS
  Filled 2024-08-24 (×5): qty 4

## 2024-08-24 MED ORDER — APIXABAN 5 MG PO TABS
5.0000 mg | ORAL_TABLET | Freq: Two times a day (BID) | ORAL | Status: DC
  Administered 2024-08-24 – 2024-08-26 (×5): 5 mg via ORAL
  Filled 2024-08-24 (×6): qty 1

## 2024-08-24 MED ORDER — HYDROXYZINE HCL 25 MG PO TABS: 25.0000 mg | ORAL_TABLET | Freq: Every evening | ORAL | Status: DC | PRN

## 2024-08-24 MED ORDER — SENNOSIDES-DOCUSATE SODIUM 8.6-50 MG PO TABS
2.0000 | ORAL_TABLET | Freq: Two times a day (BID) | ORAL | Status: DC
  Administered 2024-08-24 – 2024-08-26 (×5): 2 via ORAL
  Filled 2024-08-24 (×5): qty 2

## 2024-08-24 MED ORDER — EMPAGLIFLOZIN 25 MG PO TABS
25.0000 mg | ORAL_TABLET | Freq: Every day | ORAL | Status: DC
  Administered 2024-08-24 – 2024-08-26 (×3): 25 mg via ORAL
  Filled 2024-08-24 (×3): qty 1

## 2024-08-24 NOTE — Hospital Course (Signed)
 Evan [name] is a 79 y.o. male with medical history significant of sCHF with EF20-25%, HTN, HLD, DM, COPD, GIB, BPH, A fib and PE/DVT on Eliquis , OSA on CPAP, obesity, who presents with SOB.  Lab test showed BNP of 544, troponin peak at 36, chest x-ray showed cardiomegaly with vascular congestion.  Patient was treated with IV Lasix , cardiology is also following.

## 2024-08-24 NOTE — Plan of Care (Signed)
  Problem: Activity: Goal: Risk for activity intolerance will decrease Outcome: Progressing   Problem: Nutrition: Goal: Adequate nutrition will be maintained Outcome: Progressing   Problem: Elimination: Goal: Will not experience complications related to urinary retention Outcome: Progressing   Problem: Pain Managment: Goal: General experience of comfort will improve and/or be controlled Outcome: Progressing   Problem: Safety: Goal: Ability to remain free from injury will improve Outcome: Progressing   Problem: Skin Integrity: Goal: Risk for impaired skin integrity will decrease Outcome: Progressing

## 2024-08-24 NOTE — Progress Notes (Signed)
 Heart Failure Nurse Navigator Progress Note  PCP: Evan Alm SQUIBB, MD PCP-Cardiologist: Evan Lunger, MD Admission Diagnosis: CHF Exacerbation Admitted from: Home via [facility]  Presentation:   Evan [name] presented with chest tightness and shortness of breath.  Patient with recent diagnosis of Atrial Fibrillation. History of CHF, HLD, DM, COPD, BPH, PE/DVT on Eliquis , OSA on CPAP, Obesity.  Patient was in sinus tach with EMS.  BP 178/85, HR 98, 96% RA. BNP 544. HS-Troponin 33. Chest x-ray cardiac enlargement with pulmonary vascular congestion and perihilar infiltrates, likely edema.   ECHO/ LVEF: 20-25%.    Clinical Course:  Past Medical History:  Diagnosis Date   Adenomatous polyp of colon    Arthritis    xDJD   Atrial fibrillation (HCC)    BPH (benign prostatic hyperplasia)    CHF (congestive heart failure) (HCC)    COPD (chronic obstructive pulmonary disease) (HCC)    Diabetes mellitus without complication (HCC)    type 2   Discitis    Diverticulitis    Diverticulosis    DVT (deep venous thrombosis) (HCC)    bilateral tibial   Erectile dysfunction    Hearing loss    Hyperlipidemia    Hypertension    Ischemic optic neuropathy    Lower GI bleeding 02/23/2018   Lumbar degenerative disc disease    Non-ischemic cardiomyopathy (HCC)    Sleep apnea      Social History   Socioeconomic History   Marital status: Married    Spouse name: Evan [name]   Number of children: 1   Years of education: Not on file   Highest education level: Not on file  Occupational History   Not on file  Tobacco Use   Smoking status: Former    Current packs/day: 0.00    Average packs/day: 0.3 packs/day for 29.0 years (7.3 ttl pk-yrs)    Types: Cigarettes    Start date: 12/31/1964    Quit date: 12/31/1993    Years since quitting: 30.6    Passive exposure: Past   Smokeless tobacco: Never  Vaping Use   Vaping status: Never Used  Substance and Sexual Activity   Alcohol  use: No     Alcohol /week: 0.0 standard drinks of alcohol    Drug use: No   Sexual activity: Not on file  Other Topics Concern   Not on file  Social History Narrative   Lives with Evan [name], wife.    Social Drivers of Corporate investment banker Strain: Low Risk  (08/24/2024)   Overall Financial Resource Strain (CARDIA)    Difficulty of Paying Living Expenses: Not hard at all  Food Insecurity: No Food Insecurity (08/24/2024)   Hunger Vital Sign    Worried About Running Out of Food in the Last Year: Never true    Ran Out of Food in the Last Year: Never true  Transportation Needs: No Transportation Needs (08/24/2024)   PRAPARE - Administrator, Civil Service (Medical): No    Lack of Transportation (Non-Medical): No  Physical Activity: Not on file  Stress: Not on file  Social Connections: Socially Integrated (08/24/2024)   Social Connection and Isolation Panel    Frequency of Communication with Friends and Family: More than three times a week    Frequency of Social Gatherings with Friends and Family: More than three times a week    Attends Religious Services: More than 4 times per year    Active Member of [employer] or Organizations: Yes    Attends Club or  Organization Meetings: More than 4 times per year    Marital Status: Married   Water engineer and Provision:  Detailed education and instructions provided on heart failure disease management including the following:  Signs and symptoms of Heart Failure When to call the physician Importance of daily weights Low sodium diet Fluid restriction Medication management Anticipated future follow-up appointments  Patient education given on each of the above topics.  Patient acknowledges understanding via teach back method and acceptance of all instructions.  Education Materials:  Living Better With Heart Failure Booklet, HF zone tool, & Daily Weight Tracker Tool.  Patient has scale at home: Patient weighs himself in the gym  daily. Patient has pill box at home: Yes.    High Risk Criteria for Readmission and/or Poor Patient Outcomes: Heart failure hospital admissions (last 6 months): 2  No Show rate: 2% Difficult social situation: None Demonstrates medication adherence: Yes Primary Language: English Literacy level: Reading, Writing & Comprehension  Barriers of Care:   Patient admits to higher daily sodium intake. Loves fried foods.  Considerations/Referrals:  Referral made to Heart Failure Pharmacist Stewardship: No Referral made to Heart Failure CSW/NCM TOC: No Referral made to Heart & Vascular TOC clinic: Yes. [facility] 08/28/24 @ 8:30 AM.  Items for Follow-up on DC/TOC: Daily Weights Diet & Fluid Restrictions Continued Heart Failure Education  Evan Pines, RN, BSN [facility] Heart Failure Navigator Secure Chat Only

## 2024-08-24 NOTE — Progress Notes (Signed)
 Progress Note   Patient: Evan [name] FMW:[id number] DOB: 08/17/1945 DOA: 08/23/2024     1 DOS: the patient was seen and examined on 08/24/2024   Brief hospital course: Evan [name] is a 79 y.o. male with medical history significant of sCHF with EF20-25%, HTN, HLD, DM, COPD, GIB, BPH, A fib and PE/DVT on Eliquis , OSA on CPAP, obesity, who presents with SOB.  Lab test showed BNP of 544, troponin peak at 36, chest x-ray showed cardiomegaly with vascular congestion.  Patient was treated with IV Lasix , cardiology is also following.   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  2D echo 08/10/2024 showed EF 20-25%.  Oxygen saturation 92-95% on room air. Patient currently diuresing well with IV furosemide . Also on metoprolol , BiDil , Jardiance    PAF (paroxysmal atrial fibrillation) (HCC):  Rate well-controlled, continue amiodarone , beta-blocker and Eliquis .   DVT (deep venous thrombosis) (HCC) and Pulmonary embolism (HCC) -On Eliquis    Hypercholesteremia -Pravastatin    Hypertension -IV hydralazine  as needed - Patient is on metoprolol , BiDil , IV Lasix    Stage 2 moderate COPD by GOLD classification (HCC): Stable, no wheezing - Bronchodilators as needed Mucinex  No exacerbation.   Controlled type 2 diabetes mellitus with proteinuric  diabetic nephropathy (HCC): Recent A1c 7.0.  Patient is on Jardiance  -SSI   BPH (benign prostatic hyperplasia) -Flomax    Obesity (BMI 30-39.9): Patient has Obesity Class I, with body weight 121.8 Kg and BMI 32.7 kg/m2.  - Encourage losing weight - Exercise and healthy diet   OSA Class I obesity with BMI 32.7. Patient was not on CPAP at home, diet and exercise advised.      Subjective:  Patient is doing much better today, he was able to sleep last night without paroxysmal dyspnea.  Physical Exam: Vitals:   08/23/24 2312 08/24/24 0001 08/24/24 0400 08/24/24 0759  BP:  (!) 144/106 (!) 145/69 (!) 148/67  Pulse:  81 64 70  Resp:  20 20 19   Temp: 98.4 F (36.9 C) 98.4 F (36.9 C) 98.3 F (36.8 C) 98.4 F (36.9 C)  TempSrc: Oral Oral Oral   SpO2:  94% 100% 92%  Weight:      Height:       General exam: Appears calm and comfortable  Respiratory system: Crackles in the base. Respiratory effort normal. Cardiovascular system: Regular. No JVD, murmurs, rubs, gallops or clicks. No pedal edema. Gastrointestinal system: Abdomen is nondistended, soft and nontender. No organomegaly or masses felt. Normal bowel sounds heard. Central nervous system: Alert and oriented. No focal neurological deficits. Extremities: Symmetric 5 x 5 power. Skin: No rashes, lesions or ulcers Psychiatry: Judgement and insight appear normal. Mood & affect appropriate.    Data Reviewed:  X-ray and lab results reviewed.  Family Communication: None  Disposition: Status is: Inpatient Remains inpatient appropriate because: Severity of disease, IV treatment     Time spent: 55  minutes  Author: Murvin Mana, MD 08/24/2024 9:50 AM  For on call review [email or web address].

## 2024-08-24 NOTE — Evaluation (Signed)
 Physical Therapy Evaluation Patient Details Name: Evan [name] MRN: [id number] DOB: 26-Dec-1945 Today's Date: 08/24/2024  History of Present Illness  Pt is an 79 yo male that presented to ED for SOB. PMH of COPD, sleep apnea, HTN, afib, DMII, CHF, DVT, partial colectomy, IVC filter placed, R TKA, R THA.  Clinical Impression  Patient alert, agreeable to PT ambulating in hallway back towards room with RW upon PT arrival. Pt denied pain, reported at baseline he is independent. He was able to ambulate >220ft, initially supervision with RW but improved to modI without AD (somewhat decreased gait velocity). spO2 and HR WFLs, MD notified. The patient demonstrated and reported return to baseline level of functioning, no further acute PT needs indicated. PT to sign off. Please reconsult PT if pt status changes or acute needs are identified.          If plan is discharge home, recommend the following: Other (comment) (NA)   Can travel by private vehicle        Equipment Recommendations None recommended by PT  Recommendations for Other Services       Functional Status Assessment Patient has not had a recent decline in their functional status     Precautions / Restrictions Precautions Precautions: Fall Recall of Precautions/Restrictions: Intact Restrictions Weight Bearing Restrictions Per Provider Order: No      Mobility  Bed Mobility               General bed mobility comments: pt up ambulating upon PT arrival    Transfers Overall transfer level: Modified independent Equipment used: None                    Ambulation/Gait Ambulation/Gait assistance: Modified independent (Device/Increase time), Supervision Gait Distance (Feet): 300 Feet Assistive device: Rolling walker (2 wheels), None         General Gait Details: initially supervision with RW, progressed to modI without. endorsed feeling near his ambulation baseilne, HR/spO2 WFLs on room air  throughout  Stairs            Wheelchair Mobility     Tilt Bed    Modified Rankin (Stroke Patients Only)       Balance Overall balance assessment: No apparent balance deficits (not formally assessed) Sitting-balance support: Feet supported Sitting balance-Leahy Scale: Normal       Standing balance-Leahy Scale: Good Standing balance comment: able to stand and adjust bed linens                             Pertinent Vitals/Pain Pain Assessment Pain Assessment: No/denies pain    Home Living Family/patient expects to be discharged to:: Private residence Living Arrangements: Spouse/significant other Available Help at Discharge: Family Type of Home: House Home Access: Stairs to enter Entrance Stairs-Rails: None Secretary/administrator of Steps: 1   Home Layout: One level Home Equipment: Agricultural consultant (2 wheels);BSC/3in1      Prior Function Prior Level of Function : Independent/Modified Independent                     Extremity/Trunk Assessment   Upper Extremity Assessment Upper Extremity Assessment: Overall WFL for tasks assessed    Lower Extremity Assessment Lower Extremity Assessment: Overall WFL for tasks assessed       Communication        Cognition Arousal: Alert Behavior During Therapy: WFL for tasks assessed/performed   PT - Cognitive impairments: No apparent  impairments                         Following commands: Intact       Cueing       General Comments      Exercises     Assessment/Plan    PT Assessment Patient does not need any further PT services  PT Problem List         PT Treatment Interventions      PT Goals (Current goals can be found in the Care Plan section)       Frequency       Co-evaluation               AM-PAC PT 6 Clicks Mobility  Outcome Measure Help needed turning from your back to your side while in a flat bed without using bedrails?: None Help needed  moving from lying on your back to sitting on the side of a flat bed without using bedrails?: None Help needed moving to and from a bed to a chair (including a wheelchair)?: None Help needed standing up from a chair using your arms (e.g., wheelchair or bedside chair)?: None Help needed to walk in hospital room?: None Help needed climbing 3-5 steps with a railing? : None 6 Click Score: 24    End of Session   Activity Tolerance: Patient tolerated treatment well Patient left: in chair;with call bell/phone within reach Nurse Communication: Mobility status PT Visit Diagnosis: Other abnormalities of gait and mobility (R26.89)    Time: 1000-1010 PT Time Calculation (min) (ACUTE ONLY): 10 min   Charges:   PT Evaluation $PT Eval Low Complexity: 1 Low   PT General Charges $$ ACUTE PT VISIT: 1 Visit         Doyal Shams PT, DPT 10:28 AM,08/24/24

## 2024-08-24 NOTE — Plan of Care (Signed)

## 2024-08-24 NOTE — Care Management Important Message (Signed)
 Important Message  Patient Details  Name: Evan [name] MRN: [id number] Date of Birth: [birth year]   Important Message Given:  Yes - Medicare IM     [name] 08/24/2024, 12:38 PM

## 2024-08-25 DIAGNOSIS — J449 Chronic obstructive pulmonary disease, unspecified: Secondary | ICD-10-CM | POA: Diagnosis not present

## 2024-08-25 DIAGNOSIS — I4819 Other persistent atrial fibrillation: Secondary | ICD-10-CM

## 2024-08-25 DIAGNOSIS — I10 Essential (primary) hypertension: Secondary | ICD-10-CM | POA: Diagnosis not present

## 2024-08-25 DIAGNOSIS — I5023 Acute on chronic systolic (congestive) heart failure: Secondary | ICD-10-CM | POA: Diagnosis not present

## 2024-08-25 DIAGNOSIS — I480 Paroxysmal atrial fibrillation: Secondary | ICD-10-CM | POA: Diagnosis not present

## 2024-08-25 LAB — MAGNESIUM: Magnesium: 2.3 mg/dL (ref 1.7–2.4)

## 2024-08-25 LAB — GLUCOSE, CAPILLARY
Glucose-Capillary: 124 mg/dL — ABNORMAL HIGH (ref 70–99)
Glucose-Capillary: 114 mg/dL — ABNORMAL HIGH (ref 70–99)
Glucose-Capillary: 105 mg/dL — ABNORMAL HIGH (ref 70–99)
Glucose-Capillary: 128 mg/dL — ABNORMAL HIGH (ref 70–99)

## 2024-08-25 LAB — BASIC METABOLIC PANEL WITH GFR
Anion gap: 9 (ref 5–15)
BUN: 20 mg/dL (ref 8–23)
CO2: 30 mmol/L (ref 22–32)
Calcium: 9.5 mg/dL (ref 8.9–10.3)
Chloride: 103 mmol/L (ref 98–111)
Creatinine, Ser: 1.22 mg/dL (ref 0.61–1.24)
GFR, Estimated: 60 mL/min — ABNORMAL LOW (ref >60)
Glucose, Bld: 123 mg/dL — ABNORMAL HIGH (ref 70–99)
Potassium: 4.7 mmol/L (ref 3.5–5.1)
Sodium: 142 mmol/L (ref 135–145)

## 2024-08-25 NOTE — Plan of Care (Signed)
  Problem: Education: Goal: Ability to describe self-care measures that may prevent or decrease complications (Diabetes Survival Skills Education) will improve Outcome: Not Progressing Goal: Individualized Educational Video(s) Outcome: Not Progressing   Problem: Coping: Goal: Ability to adjust to condition or change in health will improve Outcome: Not Progressing   Problem: Fluid Volume: Goal: Ability to maintain a balanced intake and output will improve Outcome: Not Progressing   Problem: Health Behavior/Discharge Planning: Goal: Ability to identify and utilize available resources and services will improve Outcome: Not Progressing Goal: Ability to manage health-related needs will improve Outcome: Not Progressing   Problem: Metabolic: Goal: Ability to maintain appropriate glucose levels will improve Outcome: Not Progressing   Problem: Nutritional: Goal: Maintenance of adequate nutrition will improve Outcome: Not Progressing Goal: Progress toward achieving an optimal weight will improve Outcome: Not Progressing   Problem: Skin Integrity: Goal: Risk for impaired skin integrity will decrease Outcome: Not Progressing   Problem: Tissue Perfusion: Goal: Adequacy of tissue perfusion will improve Outcome: Not Progressing   Problem: Education: Goal: Knowledge of General Education information will improve Description: Including pain rating scale, medication(s)/side effects and non-pharmacologic comfort measures Outcome: Not Progressing   Problem: Health Behavior/Discharge Planning: Goal: Ability to manage health-related needs will improve Outcome: Not Progressing   Problem: Clinical Measurements: Goal: Ability to maintain clinical measurements within normal limits will improve Outcome: Not Progressing Goal: Will remain free from infection Outcome: Not Progressing Goal: Diagnostic test results will improve Outcome: Not Progressing Goal: Respiratory complications will  improve Outcome: Not Progressing Goal: Cardiovascular complication will be avoided Outcome: Not Progressing   Problem: Activity: Goal: Risk for activity intolerance will decrease Outcome: Not Progressing   Problem: Nutrition: Goal: Adequate nutrition will be maintained Outcome: Not Progressing   Problem: Coping: Goal: Level of anxiety will decrease Outcome: Not Progressing   Problem: Elimination: Goal: Will not experience complications related to bowel motility Outcome: Not Progressing Goal: Will not experience complications related to urinary retention Outcome: Not Progressing   Problem: Pain Managment: Goal: General experience of comfort will improve and/or be controlled Outcome: Not Progressing   Problem: Safety: Goal: Ability to remain free from injury will improve Outcome: Not Progressing   Problem: Skin Integrity: Goal: Risk for impaired skin integrity will decrease Outcome: Not Progressing   Problem: Education: Goal: Ability to demonstrate management of disease process will improve Outcome: Not Progressing Goal: Ability to verbalize understanding of medication therapies will improve Outcome: Not Progressing Goal: Individualized Educational Video(s) Outcome: Not Progressing   Problem: Activity: Goal: Capacity to carry out activities will improve Outcome: Not Progressing   Problem: Cardiac: Goal: Ability to achieve and maintain adequate cardiopulmonary perfusion will improve Outcome: Not Progressing

## 2024-08-25 NOTE — Plan of Care (Signed)
  Problem: Education: Goal: Knowledge of General Education information will improve Description: Including pain rating scale, medication(s)/side effects and non-pharmacologic comfort measures Outcome: Progressing   Problem: Health Behavior/Discharge Planning: Goal: Ability to manage health-related needs will improve Outcome: Progressing   Problem: Clinical Measurements: Goal: Cardiovascular complication will be avoided Outcome: Progressing   Problem: Activity: Goal: Capacity to carry out activities will improve Outcome: Progressing   Problem: Cardiac: Goal: Ability to achieve and maintain adequate cardiopulmonary perfusion will improve Outcome: Progressing

## 2024-08-25 NOTE — Plan of Care (Signed)
  Problem: Education: Goal: Ability to describe self-care measures that may prevent or decrease complications (Diabetes Survival Skills Education) will improve Outcome: Progressing   Problem: Fluid Volume: Goal: Ability to maintain a balanced intake and output will improve Outcome: Progressing   Problem: Education: Goal: Knowledge of General Education information will improve Description: Including pain rating scale, medication(s)/side effects and non-pharmacologic comfort measures Outcome: Progressing   

## 2024-08-25 NOTE — Progress Notes (Signed)
  Progress Note   Patient: Evan [name] FMW:[id number] DOB: [birth year] DOA: 08/23/2024     2 DOS: the patient was seen and examined on 08/25/2024   Brief hospital course: Evan [name] is a 79 y.o. male with medical history significant of sCHF with EF20-25%, HTN, HLD, DM, COPD, GIB, BPH, A fib and PE/DVT on Eliquis , OSA on CPAP, obesity, who presents with SOB.  Lab test showed BNP of 544, troponin peak at 36, chest x-ray showed cardiomegaly with vascular congestion.  Patient was treated with IV Lasix , cardiology is also following.   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  2D echo 08/10/2024 showed EF 20-25%.  Oxygen saturation 92-95% on room air. Patient currently diuresing well with IV furosemide . Also on metoprolol , BiDil , Jardiance  Patient has requested to see cardiology.  Will continue 1 more day of IV Lasix .  Most likely can be discharged home tomorrow.   PAF (paroxysmal atrial fibrillation) (HCC):  Rate well-controlled, continue amiodarone , beta-blocker and Eliquis .   DVT (deep venous thrombosis) (HCC) and Pulmonary embolism (HCC) -On Eliquis    Hypercholesteremia -Pravastatin    Hypertension -IV hydralazine  as needed - Patient is on metoprolol , BiDil , IV Lasix    Stage 2 moderate COPD by GOLD classification (HCC):  Stable, no wheezing - Bronchodilators as needed Mucinex  No exacerbation.   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  Patient is on Jardiance  -SSI   BPH (benign prostatic hyperplasia) -Flomax    OSA Class I obesity with BMI 32.7. Patient was not on CPAP at home, diet and exercise advised.       Subjective:  Patient doing well today, short of breath much improved.  Physical Exam: Vitals:   08/25/24 0500 08/25/24 0801 08/25/24 0804 08/25/24 1114  BP:  130/63  125/64  Pulse:  71  78  Resp:  18  18  Temp:  98.6 F (37 C)  98.9 F (37.2 C)  TempSrc:      SpO2:  97% 96% 96%  Weight: 116.7 kg     Height:       General exam: Appears calm and comfortable  Respiratory system: Clear to auscultation. Respiratory effort normal. Cardiovascular system: S1 & S2 heard, RRR. No JVD, murmurs, rubs, gallops or clicks. No pedal edema. Gastrointestinal system: Abdomen is nondistended, soft and nontender. No organomegaly or masses felt. Normal bowel sounds heard. Central nervous system: Alert and oriented. No focal neurological deficits. Extremities: Symmetric 5 x 5 power. Skin: No rashes, lesions or ulcers Psychiatry: Judgement and insight appear normal. Mood & affect appropriate.    Data Reviewed:  Lab results reviewed.  Family Communication: None  Disposition: Status is: Inpatient Remains inpatient appropriate because: Severity of disease, IV treatment     Time spent: 35 minutes  Author: Murvin Mana, MD 08/25/2024 12:01 PM  For on call review [email or web address].

## 2024-08-25 NOTE — Consult Note (Signed)
 Cardiology Consultation   Patient ID: Evan [name] MRN: [id number]; DOB: 1945-05-14  Admit date: 08/23/2024 Date of Consult: 08/25/2024  PCP:  Epifanio Alm SQUIBB, MD    [facility] Providers Cardiologist:  Evalene Lunger, MD        Patient Profile: Evan [name] is a 79 y.o. male with a hx of nonobstructive CAD by [facility] 04/2024, HFrEF, nonischemic cardiomyopathy, persistent atrial fibrillation s/p ablation in 2010, atrial flutter s/p cardioversion 12/2023, DVT/PE in 05/2023, GI bleed, type 2 diabetes, hypertension, hyperlipidemia, prostate cancer s/p radiation, LBBB, prior tobacco use (quit in 1995), and OSA on CPAP who is being seen 08/25/2024 for the evaluation of acute on chronic heart failure at the request of Dr. Laurita.  History of Present Illness:   Evan [name] was admitted to [facility] 12/2023 with atrial fibrillation/flutter and new onset HFrEF.  He was diuresed and placed on amiodarone .  He underwent successful cardioversion during admission.  Echo showed EF 25 to 30% with normal RV cavity size with mildly reduced RV systolic function with recommendation for escalation of GDMT.  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  He established care with Dr. Gollan in 01/2024.  Echo 01/2024 showed EF 35 to 40%, global hypokinesis, G1 DD.  He was seen in the ED 02/2024 after sustaining a mechanical fall and he was prescribed tramadol  for back pain.  He was seen in the ER 02/2024 for dizziness, lightheadedness, back pain, and associated shortness of breath.  Troponin negative x 2.  proBNP 1303.  Chest x-ray with mildly prominent interstitial markings.  He followed up at our office 03/2024 noting continued shortness of breath that have been present since his hospitalization in 12/2023.  He noted his functional status improved following cardioversion, though his shortness of breath persisted.  Weight was stable.  To further evaluate his cardiomyopathy, he underwent R/LHC on 05/01/2024 which  showed mild nonobstructive CAD with 10 to 20% stenosis of the mid LAD and proximal LCx as well as 20 to 30% stenosis of the proximal RCA.  Normal left and right heart filling pressure, mild pulmonary hypertension, and normal cardiac output/index.  Patient was most recently seen in our office 05/08/2024 and overall doing well from a cardiac perspective.  He was not requiring standing loop diuretic at that time.  He was unable to tolerate MRA, ARB/ACE inhibitor/ARNI secondary to hyperkalemia.  Follow-up echo ordered and completed 08/10/2024 which demonstrated severely reduced LV systolic function with EF 20 to 25%, RWMA, mild LVH, akinesis of the left ventricular anteroseptal wall, mild MR.  He was started on BiDil .    Patient reports experiencing shortness of breath the morning of 8/24 which worsened throughout the day.  He also notes a weight gain of greater than 5 pounds in the past week with worsening lower extremity swelling.  He denies any recent illness or changes that could have brought this on aside from recently being started on BiDil  in the setting of reduced EF.  He reports this morning in the hospital he experienced an episode that felt like a rattling in his chest which he attributed to possible anxiety.  He otherwise denies chest pain, lightheadedness, dizziness, and orthopnea.  He reports that he was told to be evaluated in the emergency room if he were to gain greater than 5 pounds in a week so he decided to come in. In the ED, BP 162/74 with otherwise normal vital signs.  CBC and BMP largely unremarkable.  BNP elevated at 544.  Troponin  minimally elevated and flat trending.  EKG shows sinus rhythm with first-degree AV block and left bundle branch block without acute ST/T wave abnormalities.  Chest x-ray with pulmonary vascular congestion and perihilar infiltrates, likely edema.  He was given IV Lasix  60 mg x 1 in the ED and admitted for further management.  Cardiology was asked to consult for  further evaluation and management of acute on chronic HFrEF.  At time of cardiology consult, patient reports improvements in dyspnea and lower extremity swelling with ongoing diuresis.  Past Medical History:  Diagnosis Date   Adenomatous polyp of colon    Arthritis    xDJD   Atrial fibrillation (HCC)    BPH (benign prostatic hyperplasia)    CHF (congestive heart failure) (HCC)    COPD (chronic obstructive pulmonary disease) (HCC)    Diabetes mellitus without complication (HCC)    type 2   Discitis    Diverticulitis    Diverticulosis    DVT (deep venous thrombosis) (HCC)    bilateral tibial   Erectile dysfunction    Hearing loss    Hyperlipidemia    Hypertension    Ischemic optic neuropathy    Lower GI bleeding 02/23/2018   Lumbar degenerative disc disease    Non-ischemic cardiomyopathy (HCC)    Sleep apnea     Past Surgical History:  Procedure Laterality Date   ATRIAL ABLATION SURGERY  2010   CATARACT EXTRACTION W/PHACO Right 10/10/2020   Procedure: CATARACT EXTRACTION PHACO AND INTRAOCULAR LENS PLACEMENT (IOC) RIGHT ISTENT INJ DIABETIC 2.64  00:29.4;  Surgeon: Myrna Adine Anes, MD;  Location: [facility];  Service: Ophthalmology;  Laterality: Right;  Diabetic - oral meds   CATARACT EXTRACTION W/PHACO Left 03/06/2021   Procedure: CATARACT EXTRACTION PHACO AND INTRAOCULAR LENS PLACEMENT (IOC) LEFT DIABETIC ISTENT INJ 3.62 00:30.6;  Surgeon: Myrna Adine Anes, MD;  Location: [facility];  Service: Ophthalmology;  Laterality: Left;  Diabetic - oral meds   CHOLECYSTECTOMY  2011   COLONOSCOPY Left 02/24/2018   Procedure: COLONOSCOPY;  Surgeon: Janalyn Keene NOVAK, MD;  Location: [facility] ENDOSCOPY;  Service: Endoscopy;  Laterality: Left;   COLONOSCOPY     2008, 2010, 2013   COLONOSCOPY N/A 04/06/2023   Procedure: COLONOSCOPY;  Surgeon: Toledo, Ladell POUR, MD;  Location: [facility] ENDOSCOPY;  Service: Gastroenterology;  Laterality: N/A;   COLONOSCOPY WITH PROPOFOL  N/A  09/25/2017   Procedure: COLONOSCOPY WITH PROPOFOL ;  Surgeon: Viktoria Lamar DASEN, MD;  Location: [facility] ENDOSCOPY;  Service: Endoscopy;  Laterality: N/A;   COLONOSCOPY WITH PROPOFOL  N/A 02/18/2018   Procedure: COLONOSCOPY WITH PROPOFOL ;  Surgeon: Jinny Carmine, MD;  Location: [facility] ENDOSCOPY;  Service: Endoscopy;  Laterality: N/A;   ERCP  2011   ESOPHAGOGASTRODUODENOSCOPY Left 02/23/2018   Procedure: ESOPHAGOGASTRODUODENOSCOPY (EGD);  Surgeon: Janalyn Keene NOVAK, MD;  Location[facility] ENDOSCOPY;  Service: Endoscopy;  Laterality: Left;   ESOPHAGOGASTRODUODENOSCOPY  2014   IVC FILTER INSERTION N/A 04/17/2022   Procedure: IVC FILTER INSERTION;  Surgeon: Jama Cordella MATSU, MD;  Location: [facility] INVASIVE CV LAB;  Service: Cardiovascular;  Laterality: N/A;   IVC FILTER REMOVAL N/A 07/31/2022   Procedure: IVC FILTER REMOVAL;  Surgeon: Jama Cordella MATSU, MD;  Location: [facility] INVASIVE CV LAB;  Service: Cardiovascular;  Laterality: N/A;   LAPAROTOMY  01/01/2017   Procedure: EXPLORATORY LAPAROTOMY;  Surgeon: Aloysius Plant, MD;  Location: [facility] ORS;  Service: General;;   PARTIAL COLECTOMY N/A 01/01/2017   Procedure: PARTIAL COLECTOMY;  Surgeon: Aloysius Plant, MD;  Location: [facility] ORS;  Service: General;  Laterality: N/A;   PERIPHERAL VASCULAR CATHETERIZATION N/A 12/28/2016   Procedure: Visceral Artery Intervention;  Surgeon: Selinda GORMAN Gu, MD;  Location: [facility] INVASIVE CV LAB;  Service: Cardiovascular;  Laterality: N/A;   RIGHT/LEFT HEART CATH AND CORONARY ANGIOGRAPHY Bilateral 05/01/2024   Procedure: RIGHT/LEFT HEART CATH AND CORONARY ANGIOGRAPHY;  Surgeon: Mady Bruckner, MD;  Location: [facility] INVASIVE CV LAB;  Service: Cardiovascular;  Laterality: Bilateral;   SECONDARY CLOSURE OF WOUND N/A 01/08/2017   Procedure: SECONDARY CLOSURE FOR EVISCERATION;  Surgeon: Charlie FORBES Fell, MD;  Location: [facility] ORS;  Service: General;  Laterality: N/A;   SIGMOIDECTOMY  2018   TOTAL HIP ARTHROPLASTY Right 04/24/2022   Procedure: TOTAL  HIP ARTHROPLASTY ANTERIOR APPROACH;  Surgeon: Kathlynn Sharper, MD;  Location: [facility] ORS;  Service: Orthopedics;  Laterality: Right;   TOTAL KNEE ARTHROPLASTY Right 03/24/2014   times 4       Scheduled Meds:  amiodarone   200 mg Oral Daily   apixaban   5 mg Oral BID   budesonide   0.25 mg Nebulization BID   cholecalciferol   2,000 Units Oral Daily   cyanocobalamin   1,000 mcg Oral Daily   empagliflozin   25 mg Oral Daily   furosemide   40 mg Intravenous Q12H   insulin  aspart  0-5 Units Subcutaneous QHS   insulin  aspart  0-9 Units Subcutaneous TID WC   isosorbide -hydrALAZINE   1 tablet Oral TID   metoprolol  succinate  12.5 mg Oral Daily   multivitamin with minerals  1 tablet Oral q morning   pravastatin   40 mg Oral QHS   senna-docusate  2 tablet Oral BID   tamsulosin   0.4 mg Oral QPC supper   Continuous Infusions:  PRN Meds: acetaminophen , albuterol , dextromethorphan-guaiFENesin , hydrALAZINE , hydrOXYzine , ondansetron  (ZOFRAN ) IV, polyethylene glycol  Allergies:    Allergies  Allergen Reactions   Azithromycin Rash   Penicillins Rash   Lisinopril Cough   Losartan  Cough   Metoprolol  Other (See Comments) and Rash    Bradycardia.    Social History:   Social History   Socioeconomic History   Marital status: Married    Spouse name: [name]   Number of children: 1   Years of education: Not on file   Highest education level: Not on file  Occupational History   Not on file  Tobacco Use   Smoking status: Former    Current packs/day: 0.00    Average packs/day: 0.3 packs/day for 29.0 years (7.3 ttl pk-yrs)    Types: Cigarettes    Start date: 12/31/1964    Quit date: 12/31/1993    Years since quitting: 30.6    Passive exposure: Past   Smokeless tobacco: Never  Vaping Use   Vaping status: Never Used  Substance and Sexual Activity   Alcohol  use: No    Alcohol /week: 0.0 standard drinks of alcohol    Drug use: No   Sexual activity: Not on file  Other Topics Concern   Not on file   Social History Narrative   Lives with Gerrard, wife.    Social Drivers of Corporate investment banker Strain: Low Risk  (08/24/2024)   Overall Financial Resource Strain (CARDIA)    Difficulty of Paying Living Expenses: Not hard at all  Food Insecurity: No Food Insecurity (08/24/2024)   Hunger Vital Sign    Worried About Running Out of Food in the Last Year: Never true    Ran Out of Food in the Last Year: Never true  Transportation Needs: No Transportation Needs (08/24/2024)   PRAPARE - Transportation  Lack of Transportation (Medical): No    Lack of Transportation (Non-Medical): No  Physical Activity: Not on file  Stress: Not on file  Social Connections: Socially Integrated (08/24/2024)   Social Connection and Isolation Panel    Frequency of Communication with Friends and Family: More than three times a week    Frequency of Social Gatherings with Friends and Family: More than three times a week    Attends Religious Services: More than 4 times per year    Active Member of [employer] or Organizations: Yes    Attends Engineer, structural: More than 4 times per year    Marital Status: Married  Catering manager Violence: Not At Risk (08/24/2024)   Humiliation, Afraid, Rape, and Kick questionnaire    Fear of Current or Ex-Partner: No    Emotionally Abused: No    Physically Abused: No    Sexually Abused: No    Family History:    Family History  Problem Relation Age of Onset   Diabetes Sister    Ovarian cancer Sister    Diabetes Sister    Diabetes Brother    Heart attack Brother    Diabetes Brother      ROS:  Please see the history of present illness.     Physical Exam/Data: Vitals:   08/25/24 0320 08/25/24 0500 08/25/24 0801 08/25/24 0804  BP: (!) 115/56  130/63   Pulse:   71   Resp: 18  18   Temp: 98.2 F (36.8 C)  98.6 F (37 C)   TempSrc: Oral     SpO2: 96%  97% 96%  Weight:  116.7 kg    Height:        Intake/Output Summary (Last 24 hours) at 08/25/2024  0847 Last data filed at 08/25/2024 0600 Gross per 24 hour  Intake 1317 ml  Output 2900 ml  Net -1583 ml      08/25/2024    5:00 AM 08/23/2024    6:23 PM 08/10/2024    9:02 AM  Last 3 Weights  Weight (lbs) 257 lb 4.8 oz 268 lb 9.6 oz 263 lb 6.4 oz  Weight (kg) 116.711 kg 121.836 kg 119.477 kg     Body mass index is 31.32 kg/m.  General:  Well nourished, well developed, in no acute distress HEENT: normal Neck: no JVD Vascular: No carotid bruits; Distal pulses 2+ bilaterally Cardiac:  normal S1, S2; RRR; no murmur  Lungs:  clear to auscultation bilaterally, no wheezing, rhonchi or rales  Abd: soft, nontender, no hepatomegaly  Ext: no edema Skin: warm and dry  Psych:  Normal affect   EKG:  The EKG was personally reviewed and demonstrates:  sinus rhythm with first degree AV block and LBBB, rate 89 bpm Telemetry:  Telemetry was personally reviewed and demonstrates:  Sinus rhythm with PVCs and first degree AV block  Relevant CV Studies:  08/10/2024 Echo limited 1. Left ventricular ejection fraction, by estimation, is 20 to 25%. Left  ventricular ejection fraction by 3D volume is 40 %. Left ventricular  ejection fraction by PLAX is 17 %. The left ventricle has severely  decreased function. The left ventricle  demonstrates regional wall motion abnormalities (see scoring  diagram/findings for description). There is mild left ventricular  hypertrophy. There is akinesis of the left ventricular, entire  anteroseptal wall. The average left ventricular global  longitudinal strain is -11.1 %. The global longitudinal strain is  abnormal.   2. Right ventricular systolic function is normal. The right ventricular  size is normal.   3. The mitral valve is normal in structure. Mild mitral valve  regurgitation.   4. The inferior vena cava is normal in size with greater than 50%  respiratory variability, suggesting right atrial pressure of 3 mmHg.   04/2024 R/LHC Mild, non-obstructive coronary  artery disease with 10-20% stenoses of the mid LAD and proximal LCx as well as 20-30% stenosis of the proximal RCA. Normal left and right heart filling pressures (mean RA 3, RV 42/5, PW 10, LVEDP 15 mmHg). Mild pulmonary hypertension (PA 39/10, mean 20 mmHg). Normal Fick cardiac output/index (CO 8.2 L/min, CI 3.3 L/min/m^2).  Laboratory Data: High Sensitivity Troponin:   Recent Labs  Lab 08/23/24 1828 08/23/24 2237 08/24/24 0423 08/24/24 0605  TROPONINIHS 20* 36* 33* 33*     Chemistry Recent Labs  Lab 08/23/24 1828 08/24/24 0423 08/25/24 0605  NA 139 140 142  K 4.2 3.7 4.7  CL 106 104 103  CO2 25 26 30   GLUCOSE 132* 108* 123*  BUN 12 13 20   CREATININE 0.93 1.04 1.22  CALCIUM  9.4 9.4 9.5  MG  --  2.3 2.3  GFRNONAA >60 >60 60*  ANIONGAP 8 10 9     No results for input(s): PROT, ALBUMIN, AST, ALT, ALKPHOS, BILITOT in the last 168 hours. Lipids No results for input(s): CHOL, TRIG, HDL, LABVLDL, LDLCALC, CHOLHDL in the last 168 hours.  Hematology Recent Labs  Lab 08/23/24 1828  WBC 7.5  RBC 5.49  HGB 13.8  HCT 44.4  MCV 80.9  MCH 25.1*  MCHC 31.1  RDW 15.9*  PLT 330   Thyroid No results for input(s): TSH, FREET4 in the last 168 hours.  BNP Recent Labs  Lab 08/23/24 1828  BNP 544.0*    DDimer No results for input(s): DDIMER in the last 168 hours.  Radiology/Studies:  DG Chest 2 View Result Date: 08/23/2024 IMPRESSION: Cardiac enlargement with pulmonary vascular congestion and perihilar infiltrates, likely edema. Electronically Signed   By: Elsie Gravely M.D.   On: 08/23/2024 18:58   Assessment and Plan:  Acute on chronic HFrEF - Recent echocardiogram 08/10/2024 showed EF 20 to 25% - Patient presented 8/24 with worsening dyspnea, weight gain, and lower extremity swelling - BNP 544, chest x-ray showed findings consistent with pulmonary edema - He has been diuresed with IV Lasix  with net output -4.7 L since admission.  Weight  is down from 121 kg to 116 kg. - Volume status appears to be improving - Continue IV Lasix  40 mg twice daily - He was unfortunately unable to tolerate ACE inhibitor/ARB/ARNI as well as MRA in the past due to hyperkalemia.  Will defer challenging him at this time - Continue Jardiance  25 mg daily, BiDil  20-37.5 mg 3 times daily, and metoprolol  succinate 12.5 mg daily - Recommend arranging follow-up with advanced heart failure clinic  Persistent atrial fibrillation - Maintaining sinus rhythm during this admission - He is continued on amiodarone  200 mg daily and metoprolol  succinate 12.5 mg daily - He is continued on Eliquis  5 mg twice daily in the setting of CHA2DS2-VASc 6  Hypertension - Blood pressure stable - Continue antihypertensives as above  Hx DVT/PE - Continued on Eliquis  as above   Risk Assessment/Risk Scores:  CHA2DS2-VASc Score = 6   This indicates a 9.7% annual risk of stroke. The patient's score is based upon: CHF History: 1 HTN History: 1 Diabetes History: 1 Stroke History: 0 Vascular Disease History: 1 Age Score: 2 Gender Score: 0  For questions or updates, please contact [facility] Please consult [email or web address] for contact info under    Signed, Lesley LITTIE Maffucci, PA-C  08/25/2024 8:47 AM

## 2024-08-26 DIAGNOSIS — I5043 Acute on chronic combined systolic (congestive) and diastolic (congestive) heart failure: Secondary | ICD-10-CM

## 2024-08-26 DIAGNOSIS — R0602 Shortness of breath: Secondary | ICD-10-CM

## 2024-08-26 DIAGNOSIS — I4819 Other persistent atrial fibrillation: Secondary | ICD-10-CM | POA: Diagnosis not present

## 2024-08-26 DIAGNOSIS — I10 Essential (primary) hypertension: Secondary | ICD-10-CM | POA: Diagnosis not present

## 2024-08-26 DIAGNOSIS — I420 Dilated cardiomyopathy: Secondary | ICD-10-CM

## 2024-08-26 DIAGNOSIS — I5023 Acute on chronic systolic (congestive) heart failure: Secondary | ICD-10-CM | POA: Diagnosis not present

## 2024-08-26 LAB — BASIC METABOLIC PANEL WITH GFR
Anion gap: 9 (ref 5–15)
BUN: 26 mg/dL — ABNORMAL HIGH (ref 8–23)
CO2: 32 mmol/L (ref 22–32)
Calcium: 9.2 mg/dL (ref 8.9–10.3)
Chloride: 99 mmol/L (ref 98–111)
Creatinine, Ser: 1.3 mg/dL — ABNORMAL HIGH (ref 0.61–1.24)
GFR, Estimated: 56 mL/min — ABNORMAL LOW (ref >60)
Glucose, Bld: 113 mg/dL — ABNORMAL HIGH (ref 70–99)
Potassium: 3.9 mmol/L (ref 3.5–5.1)
Sodium: 140 mmol/L (ref 135–145)

## 2024-08-26 LAB — CBC
HCT: 44.4 % (ref 39.0–52.0)
Hemoglobin: 14 g/dL (ref 13.0–17.0)
MCH: 25.3 pg — ABNORMAL LOW (ref 26.0–34.0)
MCHC: 31.5 g/dL (ref 30.0–36.0)
MCV: 80.1 fL (ref 80.0–100.0)
Platelets: 334 K/uL (ref 150–400)
RBC: 5.54 MIL/uL (ref 4.22–5.81)
RDW: 15.5 % (ref 11.5–15.5)
WBC: 6.4 K/uL (ref 4.0–10.5)
nRBC: 0 % (ref 0.0–0.2)

## 2024-08-26 LAB — GLUCOSE, CAPILLARY: Glucose-Capillary: 203 mg/dL — ABNORMAL HIGH (ref 70–99)

## 2024-08-26 LAB — MAGNESIUM: Magnesium: 2.3 mg/dL (ref 1.7–2.4)

## 2024-08-26 MED ORDER — POTASSIUM CHLORIDE CRYS ER 20 MEQ PO TBCR: 40.0000 meq | EXTENDED_RELEASE_TABLET | Freq: Once | ORAL | Status: DC

## 2024-08-26 MED ORDER — FUROSEMIDE 20 MG PO TABS: 20.0000 mg | ORAL_TABLET | Freq: Every day | ORAL | Status: DC

## 2024-08-26 MED ORDER — IRBESARTAN 75 MG PO TABS: 75.0000 mg | ORAL_TABLET | Freq: Every day | ORAL | 11 refills | Status: DC

## 2024-08-26 MED ORDER — FUROSEMIDE 40 MG PO TABS: 40.0000 mg | ORAL_TABLET | Freq: Every day | ORAL | Status: DC

## 2024-08-26 MED ORDER — FUROSEMIDE 20 MG PO TABS: 20.0000 mg | ORAL_TABLET | Freq: Every day | ORAL | 11 refills | Status: DC

## 2024-08-26 MED ORDER — IRBESARTAN 150 MG PO TABS
75.0000 mg | ORAL_TABLET | Freq: Every day | ORAL | Status: DC
  Administered 2024-08-26: 75 mg via ORAL
  Filled 2024-08-26: qty 1

## 2024-08-26 MED ORDER — POTASSIUM CHLORIDE CRYS ER 20 MEQ PO TBCR
40.0000 meq | EXTENDED_RELEASE_TABLET | Freq: Once | ORAL | Status: AC
  Administered 2024-08-26: 40 meq via ORAL
  Filled 2024-08-26: qty 2

## 2024-08-26 NOTE — Progress Notes (Signed)
 Rounding Note   Patient Name: Evan [name] Date of Encounter: 08/26/2024  [facility] Cardiologist: Evalene Lunger, MD   Subjective Reports feeling well, would like to go home Has a roommate, did not sleep well last night due to disturbance next-door Denies significant leg swelling, no abdominal distention, slight fullness in the chest Ambulating without significant issues -5 L negative this admission - Reports he was instructed to hold Lasix  as outpatient for renal dysfunction, creatinine 1.4 with elevated potassium Was on spironolactone 25, Lasix  40, Jardiance  25 at the time  Scheduled Meds:  amiodarone   200 mg Oral Daily   apixaban   5 mg Oral BID   budesonide   0.25 mg Nebulization BID   cholecalciferol   2,000 Units Oral Daily   cyanocobalamin   1,000 mcg Oral Daily   empagliflozin   25 mg Oral Daily   [START ON 08/27/2024] furosemide   20 mg Oral Daily   insulin  aspart  0-5 Units Subcutaneous QHS   insulin  aspart  0-9 Units Subcutaneous TID WC   irbesartan   75 mg Oral Daily   isosorbide -hydrALAZINE   1 tablet Oral TID   metoprolol  succinate  12.5 mg Oral Daily   multivitamin with minerals  1 tablet Oral q morning   pravastatin   40 mg Oral QHS   senna-docusate  2 tablet Oral BID   tamsulosin   0.4 mg Oral QPC supper   Continuous Infusions:  PRN Meds: acetaminophen , albuterol , dextromethorphan-guaiFENesin , hydrALAZINE , hydrOXYzine , ondansetron  (ZOFRAN ) IV, polyethylene glycol   Vital Signs  Vitals:   08/25/24 2024 08/26/24 0041 08/26/24 0500 08/26/24 0901  BP: (!) 116/57 109/68  134/68  Pulse: 71 67  75  Resp: 20 20  18   Temp: 97.9 F (36.6 C) 98 F (36.7 C)  97.7 F (36.5 C)  TempSrc: Oral Oral    SpO2: 96% 97%  99%  Weight:   122 kg   Height:        Intake/Output Summary (Last 24 hours) at 08/26/2024 1107 Last data filed at 08/26/2024 1029 Gross per 24 hour  Intake 960 ml  Output 1500 ml  Net -540 ml      08/26/2024    5:00 AM 08/25/2024     5:00 AM 08/23/2024    6:23 PM  Last 3 Weights  Weight (lbs) 268 lb 15.4 oz 257 lb 4.8 oz 268 lb 9.6 oz  Weight (kg) 122 kg 116.711 kg 121.836 kg      Telemetry Normal sinus rhythm- Personally Reviewed  ECG   - Personally Reviewed  Physical Exam  GEN: No acute distress.   Neck: No JVD Cardiac: RRR, no murmurs, rubs, or gallops.  Respiratory: Clear to auscultation bilaterally. GI: Soft, nontender, non-distended  MS: No edema; No deformity. Neuro:  Nonfocal  Psych: Normal affect   Labs High Sensitivity Troponin:   Recent Labs  Lab 08/23/24 1828 08/23/24 2237 08/24/24 0423 08/24/24 0605  TROPONINIHS 20* 36* 33* 33*     Chemistry Recent Labs  Lab 08/24/24 0423 08/25/24 0605 08/26/24 0516  NA 140 142 140  K 3.7 4.7 3.9  CL 104 103 99  CO2 26 30 32  GLUCOSE 108* 123* 113*  BUN 13 20 26*  CREATININE 1.04 1.22 1.30*  CALCIUM  9.4 9.5 9.2  MG 2.3 2.3 2.3  GFRNONAA >60 60* 56*  ANIONGAP 10 9 9     Lipids No results for input(s): CHOL, TRIG, HDL, LABVLDL, LDLCALC, CHOLHDL in the last 168 hours.  Hematology Recent Labs  Lab 08/23/24 1828 08/26/24 9483  WBC 7.5 6.4  RBC 5.49 5.54  HGB 13.8 14.0  HCT 44.4 44.4  MCV 80.9 80.1  MCH 25.1* 25.3*  MCHC 31.1 31.5  RDW 15.9* 15.5  PLT 330 334   Thyroid No results for input(s): TSH, FREET4 in the last 168 hours.  BNP Recent Labs  Lab 08/23/24 1828  BNP 544.0*    DDimer No results for input(s): DDIMER in the last 168 hours.   Radiology  No results found.  Cardiac Studies    Patient Profile   Evan [name] is a 79 y.o. male with a hx of nonobstructive CAD by [facility] 04/2024, HFrEF, nonischemic cardiomyopathy, persistent atrial fibrillation s/p ablation in 2010, atrial flutter s/p cardioversion 12/2023, DVT/PE in 05/2023, GI bleed, type 2 diabetes, hypertension, hyperlipidemia, prostate cancer s/p radiation, LBBB, prior tobacco use (quit in 1995), and OSA on CPAP who is being seen 08/25/2024 for  the evaluation of acute on chronic heart failure   Assessment & Plan  Acute on chronic HFrEF - echo 08/10/2024 showed EF 20 to 25% - 8/24 with worsening dyspnea, weight gain, and lower extremity swelling - BNP 544, chest x-ray showed findings consistent with pulmonary edema -Treated this admission with IV Lasix  twice daily, -5 L - Continue Jardiance  25 mg daily, BiDil  20-37.5 mg 3 times daily, and metoprolol  succinate 12.5 mg daily -Retrial of irbesartan  75 daily, hold spironolactone given history of hyperkalemia - Restart Lasix  20 with close monitoring of renal function -Daily weight at home, extra Lasix  20 for 3 pound weight gain   Persistent atrial fibrillation -Maintaining normal sinus rhythm - He is continued on amiodarone  200 mg daily and metoprolol  succinate 12.5 mg daily - Continue Eliquis  5 twice daily   Hypertension Blood pressure is well controlled  Medications as above   Hx DVT/PE - Continued on Eliquis  as above   Long discussion concerning CHF management, daily weights, adjustment of his Lasix  depending on his weight, moderating fluid intake  For questions or updates, please contact [facility] Please consult [email or web address] for contact info under     Signed, Garwood Wentzell, MD  08/26/2024, 11:07 AM

## 2024-08-26 NOTE — Discharge Summary (Signed)
 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  Outpatient EP consultation should be considered. Follow up LABS/TEST:  as above   Follow-up Information     [name]. Go on 08/28/2024.   Specialty: Cardiology Why: Hospital Folow-Up 08/28/2024 @ 8:30 AM Please bring all medications to follow-up appointment [facility] [address] Floor Free Valet Parking at the door Contact information: [address] [place]  [address] [phone]        Epifanio Alm SQUIBB, MD Follow up in 1 week(s).   Specialty: Infectious Diseases Contact information: [address] [place] [address] [phone]                Diet recommendation: Cardiac and Carb modified diet  Activity: The patient is advised to gradually reintroduce usual activities, as tolerated  Discharge Condition: stable  Code Status: Full code   History of present illness: As per the H and P dictated on  admission. Hospital Course:  Evan [name] is a 79 y.o. male with medical history significant of sCHF with EF20-25%, HTN, HLD, DM, COPD, GIB, BPH, A fib and PE/DVT on Eliquis , OSA on CPAP, obesity, who presents with SOB.  Lab test showed BNP of 544, troponin peak at 36, chest x-ray showed cardiomegaly with vascular congestion.  Patient was treated with IV Lasix , cardiology was consulted.   Assessment and Plan: # Acute on chronic systolic (congestive) heart failure (HCC) Myocardial injury: Patient presented with extremity edema, elevated BNP 544, vascular congestion on chest x-ray, clinically consistent with CHF exacerbation.  2D echo 08/10/2024 showed EF 20-25%.  Oxygen saturation 92-95% on room air. S/p IV furosemide .  Patient was on metoprolol , BiDil , Jardiance .  Patient was seen by cardiology, diuresed today with with IV Lasix  and cleared to discharge on GDMT Lasix  20 mg p.o. daily an additional dose of 20 mg with 3 pound weight gain.  Irbesartan  75 mg p.o. daily, Toprol -XL 12.5 mg p.o. daily, Imdur-hydralazine  25-37.5 3 times daily.  Patient was advised to continue fluid restriction 1.5 L/day.  Follow-up with cardiology in 1 week. EP consult as an outpatient, patient may benefit from CRT-D    # PAF (paroxysmal atrial fibrillation): Rate well-controlled, continue amiodarone , beta-blocker and Eliquis .   # DVT (deep venous thrombosis) and Pulmonary embolism -On Eliquis    # Hypercholesteremia: Pravastatin   # Hypertension: Continue metoprolol , BiDil , and Lasix  Discontinued amlodipine  started irbesartan  as a trial again by cardiology because patient had allergy to lisinopril and losartan  (cough) Patient was  advised to follow-up with cardiology if there is a persistent coughing due to irbesartan .  Monitor BP at home and follow with cardiology to titrate medication accordingly.  # Stage 2 moderate COPD by GOLD classification (HCC): Stable, no wheezing.  Continued home inhalers. No  exacerbation. 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  Patient is on Jardiance  and metformin .  Continue diabetic diet, monitor CBG and follow with PCP. # BPH (benign prostatic hyperplasia): Continue Flomax  # OSA Class I obesity with BMI 32.7. Patient was not on CPAP at home, diet and exercise advised.     Body mass index is 32.74 kg/m.  Nutrition Interventions:  Patient was ambulatory without any assistance. On the day of the discharge the patient's vitals were stable, and no other acute medical condition were reported by patient. the patient was felt safe to be discharge at Home.  Consultants: Cardiology Procedures: None  Discharge Exam: General: Appear in no distress, no Rash; Oral Mucosa Clear, moist. Cardiovascular: S1 and S2 Present, no Murmur, Respiratory: normal respiratory effort, Bilateral Air entry present and no Crackles, no wheezes Abdomen: Bowel Sound present, Soft and no tenderness, no hernia Extremities: no Pedal edema, no calf tenderness Neurology: alert and oriented to time, place, and person affect appropriate.  Filed Weights   08/23/24 1823 08/25/24 0500 08/26/24 0500  Weight: 121.8 kg 116.7 kg 122 kg   Vitals:   08/26/24 0041 08/26/24 0901  BP: 109/68 134/68  Pulse: 67 75  Resp: 20 18  Temp: 98 F (36.7 C) 97.7 F (36.5 C)  SpO2: 97% 99%    DISCHARGE MEDICATION: Allergies as of 08/26/2024       Reactions   Azithromycin Rash   Penicillins Rash   Lisinopril Cough   Losartan  Cough   Metoprolol  Other (See Comments), Rash   Bradycardia.        Medication List     STOP taking these medications    amLODipine  10 MG tablet Commonly known as: NORVASC        TAKE these medications    acetaminophen  500 MG tablet Commonly known as: TYLENOL  Take 1,000 mg by mouth every 8 (eight) hours as needed.   albuterol  108 (90 Base) MCG/ACT inhaler Commonly known as: VENTOLIN  HFA Inhale 2  puffs into the lungs every 6 (six) hours as needed for shortness of breath or wheezing.   amiodarone  200 MG tablet Commonly known as: PACERONE  Take 1 tablet (200 mg total) by mouth daily.   apixaban  5 MG Tabs tablet Commonly known as: ELIQUIS  Take 1 tablet (5 mg total) by mouth 2 (two) times daily.   Arnuity Ellipta 100 MCG/ACT Aepb Generic drug: Fluticasone Furoate Inhale 1 puff into the lungs daily.   CENTRUM SILVER PO Take 1 tablet by mouth every morning.   cyanocobalamin  1000 MCG tablet Commonly known as: VITAMIN B12 Take 1,000 mcg by mouth daily.   furosemide  20 MG tablet Commonly known as: LASIX  Take 1 tablet (20 mg total) by mouth daily. Take extra 20 mg with 3 pound weight gain Start taking on: August 27, 2024   hydrOXYzine  25 MG tablet Commonly known as: ATARAX  Take 25 mg by mouth at bedtime as needed (sleep).   irbesartan  75 MG tablet Commonly known as: AVAPRO  Take 1 tablet (75 mg total) by mouth daily. Start taking on: August 27, 2024   isosorbide -hydrALAZINE  20-37.5 MG tablet Commonly known as: BiDil  Take 1 tablet by mouth 3 (three) times daily.   Jardiance  25  MG Tabs tablet Generic drug: empagliflozin  Take 25 mg by mouth daily.   metFORMIN  500 MG 24 hr tablet Commonly known as: GLUCOPHAGE -XR Take 2 tablets (1,000 mg total) by mouth daily with breakfast.   metoprolol  succinate 25 MG 24 hr tablet Commonly known as: TOPROL -XL Take 0.5 tablets (12.5 mg total) by mouth daily.   polyethylene glycol 17 g packet Commonly known as: MIRALAX  / GLYCOLAX  Take 17 g by mouth daily. What changed:  when to take this reasons to take this   pravastatin  40 MG tablet Commonly known as: PRAVACHOL  Take 40 mg by mouth at bedtime.   tamsulosin  0.4 MG Caps capsule Commonly known as: FLOMAX  Take 0.4 mg by mouth daily after supper.   Vitamin D3 50 MCG (2000 UT) capsule Take 2,000 Units by mouth daily.       Allergies  Allergen Reactions   Azithromycin Rash    Penicillins Rash   Lisinopril Cough   Losartan  Cough   Metoprolol  Other (See Comments) and Rash    Bradycardia.   Discharge Instructions     Call MD for:  difficulty breathing, headache or visual disturbances   Complete by: As directed    Call MD for:  extreme fatigue   Complete by: As directed    Call MD for:  persistant dizziness or light-headedness   Complete by: As directed    Call MD for:  persistant nausea and vomiting   Complete by: As directed    Call MD for:  severe uncontrolled pain   Complete by: As directed    Call MD for:  temperature >100.4   Complete by: As directed    Diet - low sodium heart healthy   Complete by: As directed    Diet Carb Modified   Complete by: As directed    Discharge instructions   Complete by: As directed    F/u with PCP in 1 wk F/u with cards in 1 wk Given his persistently severely reduced LVEF and LBBB, he may benefit from CRT-D.  Outpatient EP consultation should be considered.   Increase activity slowly   Complete by: As directed        The results of significant diagnostics from this hospitalization (including imaging, microbiology, ancillary and laboratory) are listed below for reference.    Significant Diagnostic Studies: DG Chest 2 View Result Date: 08/23/2024 CLINICAL DATA:  Shortness of breath. Chest tightness starting about an hour ago. Recent diagnosis of atrial fibrillation. EXAM: CHEST - 2 VIEW COMPARISON:  03/07/2024 FINDINGS: Cardiac enlargement with pulmonary vascular congestion. Perihilar and basilar infiltrates are likely edema. No pleural effusion or pneumothorax. Mediastinal contours appear intact. Degenerative changes in the spine and shoulders. IMPRESSION: Cardiac enlargement with pulmonary vascular congestion and perihilar infiltrates, likely edema. Electronically Signed   By: Elsie Gravely M.D.   On: 08/23/2024 18:58   ECHOCARDIOGRAM LIMITED Result Date: 08/10/2024    ECHOCARDIOGRAM LIMITED REPORT   Patient  Name:   RAVIS HERNE Date of Exam: 08/10/2024 Medical Rec #:  [id number]        Height:       76.0 in Accession #:    [id number]       Weight:       263.4 lb Date of Birth:  [birth year]        BSA:          2.490 m Patient Age:    79 years         BP:  130/62 mmHg Patient Gender: M                HR:           81 bpm. Exam Location:  [place] Procedure: 2D Echo, 3D Echo, Limited Echo, Pediatric Echo, Limited Color Doppler            and Strain Analysis (Both Spectral and Color Flow Doppler were            utilized during procedure). Indications:    I25.5 Ischemic cardiomyopathy  History:        Patient has prior history of Echocardiogram examinations, most                 recent 01/25/2024. CHF and Cardiomegaly, Abnormal ECG, COPD,                 Signs/Symptoms:Syncope; Risk Factors:Hypertension, Diabetes,                 Dyslipidemia and Former Smoker.  Sonographer:    Doyal Point MHA, BS, RDCS Referring Phys: [id number] RYAN M DUNN IMPRESSIONS  1. Left ventricular ejection fraction, by estimation, is 20 to 25%. Left ventricular ejection fraction by 3D volume is 40 %. Left ventricular ejection fraction by PLAX is 17 %. The left ventricle has severely decreased function. The left ventricle demonstrates regional wall motion abnormalities (see scoring diagram/findings for description). There is mild left ventricular hypertrophy. There is akinesis of the left ventricular, entire anteroseptal wall. The average left ventricular global longitudinal strain is -11.1 %. The global longitudinal strain is abnormal.  2. Right ventricular systolic function is normal. The right ventricular size is normal.  3. The mitral valve is normal in structure. Mild mitral valve regurgitation.  4. The inferior vena cava is normal in size with greater than 50% respiratory variability, suggesting right atrial pressure of 3 mmHg. FINDINGS  Left Ventricle: Left ventricular ejection fraction, by estimation, is 20 to 25%. Left  ventricular ejection fraction by PLAX is 17 %. Left ventricular ejection fraction by 3D volume is 40 %. The left ventricle has severely decreased function. The left ventricle demonstrates regional wall motion abnormalities. The average left ventricular global longitudinal strain is -11.1 %. Strain was performed and the global longitudinal strain is abnormal. There is mild left ventricular hypertrophy. Right Ventricle: The right ventricular size is normal. No increase in right ventricular wall thickness. Right ventricular systolic function is normal. Left Atrium: Left atrial size was normal in size. Right Atrium: Right atrial size was normal in size. Pericardium: There is no evidence of pericardial effusion. Mitral Valve: The mitral valve is normal in structure. Mild mitral annular calcification. Mild mitral valve regurgitation. Tricuspid Valve: The tricuspid valve is normal in structure. Tricuspid valve regurgitation is not demonstrated. Aortic Valve: Aortic valve mean gradient measures 3.0 mmHg. Aortic valve peak gradient measures 5.3 mmHg. Venous: The inferior vena cava is normal in size with greater than 50% respiratory variability, suggesting right atrial pressure of 3 mmHg. LEFT VENTRICLE PLAX 2D LV EF:         Left            Diastology                ventricular     LV e' medial:    6.20 cm/s                ejection        LV E/e' medial:  12.0  fraction by     LV e' lateral:   9.68 cm/s                PLAX is 17      LV E/e' lateral: 7.7                %. LVIDd:         5.67 cm         2D Longitudinal LVIDs:         5.24 cm         Strain LV PW:         1.12 cm         2D Strain GLS   -11.1 % LV IVS:        1.12 cm         Avg:                                 3D Volume EF                                LV 3D EF:    Left                                             ventricul                                             ar                                             ejection                                              fraction                                             by 3D                                             volume is                                             40 %.                                 3D Volume EF:  3D EF:        40 %                                LV EDV:       257 ml                                LV ESV:       153 ml                                LV SV:        104 ml AORTIC VALVE AV Vmax:           115.00 cm/s AV Vmean:          77.700 cm/s AV VTI:            0.209 m AV Peak Grad:      5.3 mmHg AV Mean Grad:      3.0 mmHg LVOT Vmax:         74.80 cm/s LVOT Vmean:        49.300 cm/s LVOT VTI:          0.133 m LVOT/AV VTI ratio: 0.64 MITRAL VALVE MV Area (PHT): 5.38 cm    SHUNTS MV Decel Time: 141 msec    Systemic VTI: 0.13 m MV E velocity: 74.40 cm/s MV A velocity: 34.40 cm/s MV E/A ratio:  2.16 Redell Cave MD Electronically signed by Redell Cave MD Signature Date/Time: 08/10/2024/5:48:16 PM    Final     Microbiology: No results found for this or any previous visit (from the past 240 hours).   Labs: CBC: Recent Labs  Lab 08/23/24 1828 08/26/24 0516  WBC 7.5 6.4  HGB 13.8 14.0  HCT 44.4 44.4  MCV 80.9 80.1  PLT 330 334   Basic Metabolic Panel: Recent Labs  Lab 08/23/24 1828 08/24/24 0423 08/25/24 0605 08/26/24 0516  NA 139 140 142 140  K 4.2 3.7 4.7 3.9  CL 106 104 103 99  CO2 25 26 30  32  GLUCOSE 132* 108* 123* 113*  BUN 12 13 20  26*  CREATININE 0.93 1.04 1.22 1.30*  CALCIUM  9.4 9.4 9.5 9.2  MG  --  2.3 2.3 2.3   Liver Function Tests: No results for input(s): AST, ALT, ALKPHOS, BILITOT, PROT, ALBUMIN in the last 168 hours. No results for input(s): LIPASE, AMYLASE in the last 168 hours. No results for input(s): AMMONIA in the last 168 hours. Cardiac Enzymes: No results for input(s): CKTOTAL, CKMB, CKMBINDEX, TROPONINI in the last 168 hours. BNP (last 3 results) Recent Labs     08/23/24 1828  BNP 544.0*   CBG: Recent Labs  Lab 08/25/24 0802 08/25/24 1145 08/25/24 1600 08/25/24 2203 08/26/24 0858  GLUCAP 124* 114* 105* 128* 203*    Time spent: 35 minutes  Signed:  Elvan Sor  [facility] 08/26/2024 11:32 AM

## 2024-08-26 NOTE — TOC CM/SW Note (Signed)
 Transition of Care [facility]) - Inpatient Brief Assessment   Patient Details  Name: Evan [name] MRN: [id number] Date of Birth: 06/06/1945  [facility] of [facility] CM/SW Contact:    Lauraine JAYSON Carpen, LCSW Phone Number: 08/26/2024, 11:44 AM   Clinical Narrative: Patient has orders to discharge home today. Chart reviewed. No TOC needs identified. CSW signing off.  Transition of Care Asessment: Insurance and Status: Insurance coverage has been reviewed Patient has primary care physician: Yes Home environment has been reviewed: Single family home Prior level of function:: Independent Prior/Current Home Services: No current home services Social Drivers of Health Review: SDOH reviewed no interventions necessary Readmission risk has been reviewed: Yes Transition of care needs: no transition of care needs at this time

## 2024-08-27 ENCOUNTER — Telehealth: Payer: Self-pay | Admitting: Family

## 2024-08-27 NOTE — Progress Notes (Unsigned)
 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  Admitted to [facility] in 12/2023 with A-fib/flutter and new onset HFrEF. He was diuresed and placed on amiodarone . He underwent successful cardioversion during admission. Echo showed an EF of 25 to 30% with normal RV cavity size with mildly reduced RVSF with recommendation for escalation of GDMT. 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   Echo 01/2024 showed an EF of 35 to 40%, global hypokinesis, grade 1 diastolic dysfunction, normal RV systolic function and ventricular cavity size, mild calcification of the aortic valve, and an estimated right atrial pressure of 3 mmHg.   Seen in the ED in 02/2024 after sustaining a mechanical fall and was prescribed tramadol  for back pain. Seen in the ER in 02/2024 dizziness, lightheadedness, back pain, and associated shortness of breath. High-sensitivity troponin negative x 2. ProBNP 1303.  Chest x-ray with mildly prominent interstitial markings.   [facility] 05/01/2024 showed mild, nonobstructive CAD with 10 to 20% stenoses of the mid LAD and proximal LCx as well as 20 to 30% stenosis of the proximal RCA. Normal left and right heart filling pressure, mild pulm hypertension, and normal cardiac output/index.   Echo 08/10/2024 showed EF 20 to 25%   Admitted 08/23/24 with shortness of breath and chest tightness with 5 pound weight gain. BNP 544. IV diuresed with resultant loss of 5L. Spironolactone held due to hyperkalemia. Irbesartan / bidil  started.   He presents today  for his initial HF visit with a chief complaint of dizziness. Has associated minimal shortness of breath, fatigue, dizziness with sudden position changes that started yesterday. He says that he started palau and bidil  yesterday and he's been getting dizzy if he stands up too quickly. Yesterday he said that he also saw spots in front of his eyes when he was dizzy. No falls and he is using a cane for stability.   Does not add salt to his food. Eats our on occasion such as last night. Celebrated 79 y/o marriage and [place] and had salmon, baked sweet potato and broccoli, no bread. Did notice a 2 pound weight gain overnight on home scale.   Denies tob, etoh, drug use  Review of Systems: [y] = yes, [ ]  = no   General: Weight gain [ ] ; Weight loss [ ] ; Anorexia [ ] ; Fatigue Davis.Dad ]; Fever [ ] ; Chills [ ] ; Weakness [ ]   Cardiac: Chest pain/pressure [ ] ; Resting SOB [ ] ; Exertional SOB Davis.Dad ]; Orthopnea [ ] ; Pedal Edema [ ] ; Palpitations [ ] ; Syncope [ ] ; Presyncope [ ] ; Paroxysmal nocturnal dyspnea[ ]   Pulmonary: Cough [ ] ; Wheezing[ ] ; Hemoptysis[ ] ; Sputum [ ] ; Snoring [ ]   GI: Vomiting[ ] ; Dysphagia[ ] ; Melena[ ] ; Hematochezia [ ] ; Heartburn[ ] ; Abdominal pain [ ] ; Constipation [ ] ; Diarrhea [ ] ; BRBPR [ ]   GU: Hematuria[ ] ; Dysuria [ ] ; Nocturia[ ]   Vascular: Pain in legs with walking [ ] ; Pain in feet with lying flat [ ] ; Non-healing sores [ ] ; Stroke [ ] ; TIA [ ] ; Slurred speech [ ] ;  Neuro:  Dizziness[y ]; Vertigo[ ] ; Seizures[ ] ; Paresthesias[ ] ;Blurred vision [ ] ; Diplopia [ ] ; Vision changes [ ]   Ortho/Skin: Arthritis [ ] ; Joint pain [ ] ; Muscle pain [ ] ; Joint swelling [ ] ; Back Pain [ ] ; Rash [ ]   Psych: Depression[ ] ; Anxiety[ ]   Heme: Bleeding problems [ ] ; Clotting disorders [ ] ; Anemia [ ]   Endocrine: Diabetes [ y]; Thyroid dysfunction[ ]    Past Medical History:  Diagnosis Date   Adenomatous polyp of colon    Arthritis    xDJD   Atrial fibrillation (HCC)    BPH (benign prostatic  hyperplasia)    CHF (congestive heart failure) (HCC)    COPD (chronic obstructive pulmonary disease) (HCC)    Diabetes mellitus without complication (HCC)    type 2   Discitis    Diverticulitis    Diverticulosis    DVT (deep venous thrombosis) (HCC)    bilateral tibial   Erectile dysfunction    Hearing loss    Hyperlipidemia    Hypertension    Ischemic optic neuropathy    Lower GI bleeding 02/23/2018   Lumbar degenerative disc disease    Non-ischemic cardiomyopathy (HCC)    Sleep apnea     Current Outpatient Medications  Medication Sig Dispense Refill   acetaminophen  (TYLENOL ) 500 MG tablet Take 1,000 mg by mouth every 8 (eight) hours as needed.     albuterol  (VENTOLIN  HFA) 108 (90 Base) MCG/ACT inhaler Inhale 2 puffs into the lungs every 6 (six) hours as needed for shortness of breath or wheezing.     amiodarone  (PACERONE ) 200 MG tablet Take 1 tablet (200 mg total) by mouth daily. 90 tablet 1   apixaban  (ELIQUIS ) 5 MG TABS tablet Take 1 tablet (5 mg total) by mouth 2 (two) times daily. 60 tablet 1   ARNUITY ELLIPTA 100 MCG/ACT AEPB Inhale 1 puff into the lungs daily.     Cholecalciferol  (VITAMIN D3) 2000 units capsule Take 2,000 Units by mouth daily.     furosemide  (LASIX ) 20 MG tablet Take 1 tablet (20 mg total) by mouth daily. Take extra 20 mg with 3 pound weight gain 30 tablet 11   hydrOXYzine  (ATARAX /VISTARIL ) 25 MG tablet Take 25 mg by mouth at bedtime as needed (sleep).     irbesartan  (AVAPRO ) 75 MG tablet Take 1 tablet (75 mg total) by mouth daily. 30 tablet 11   isosorbide -hydrALAZINE  (BIDIL ) 20-37.5 MG tablet Take 1 tablet by mouth 3 (three) times daily. 270 tablet 3   JARDIANCE  25 MG TABS tablet Take 25 mg by mouth daily.     metFORMIN  (GLUCOPHAGE -XR) 500 MG 24 hr tablet Take 2 tablets (1,000 mg total) by mouth daily with breakfast.     metoprolol  succinate (TOPROL -XL) 25 MG 24 hr tablet Take 0.5 tablets (12.5 mg total) by mouth daily. 45 tablet 3   Multiple  Vitamins-Minerals (CENTRUM SILVER PO) Take 1 tablet by mouth every morning.     polyethylene glycol (MIRALAX  / GLYCOLAX ) packet Take 17 g by mouth daily. (Patient taking differently: Take 17 g by mouth daily as needed for mild constipation.) 14 each 0   pravastatin  (PRAVACHOL ) 40 MG tablet Take 40 mg by mouth at bedtime.     tamsulosin  (FLOMAX ) 0.4 MG CAPS capsule Take 0.4 mg by mouth daily after supper.     vitamin B-12 (CYANOCOBALAMIN ) 1000 MCG tablet Take 1,000 mcg by mouth daily.     No current facility-administered medications for this visit.    Allergies  Allergen Reactions  Azithromycin Rash   Penicillins Rash   Lisinopril Cough   Losartan  Cough   Metoprolol  Other (See Comments) and Rash    Bradycardia.      Social History   Socioeconomic History   Marital status: Married    Spouse name: [name]   Number of children: 1   Years of education: Not on file   Highest education level: Not on file  Occupational History   Not on file  Tobacco Use   Smoking status: Former    Current packs/day: 0.00    Average packs/day: 0.3 packs/day for 29.0 years (7.3 ttl pk-yrs)    Types: Cigarettes    Start date: 12/31/1964    Quit date: 12/31/1993    Years since quitting: 30.6    Passive exposure: Past   Smokeless tobacco: Never  Vaping Use   Vaping status: Never Used  Substance and Sexual Activity   Alcohol  use: No    Alcohol /week: 0.0 standard drinks of alcohol    Drug use: No   Sexual activity: Not on file  Other Topics Concern   Not on file  Social History Narrative   Lives with [place], wife.    Social Drivers of Corporate investment banker Strain: Low Risk  (08/24/2024)   Overall Financial Resource Strain (CARDIA)    Difficulty of Paying Living Expenses: Not hard at all  Food Insecurity: No Food Insecurity (08/24/2024)   Hunger Vital Sign    Worried About Running Out of Food in the Last Year: Never true    Ran Out of Food in the Last Year: Never true  Transportation Needs:  No Transportation Needs (08/24/2024)   PRAPARE - Administrator, Civil Service (Medical): No    Lack of Transportation (Non-Medical): No  Physical Activity: Not on file  Stress: Not on file  Social Connections: Socially Integrated (08/24/2024)   Social Connection and Isolation Panel    Frequency of Communication with Friends and Family: More than three times a week    Frequency of Social Gatherings with Friends and Family: More than three times a week    Attends Religious Services: More than 4 times per year    Active Member of Clubs or Organizations: Yes    Attends Banker Meetings: More than 4 times per year    Marital Status: Married  Catering manager Violence: Not At Risk (08/24/2024)   Humiliation, Afraid, Rape, and Kick questionnaire    Fear of Current or Ex-Partner: No    Emotionally Abused: No    Physically Abused: No    Sexually Abused: No      Family History  Problem Relation Age of Onset   Diabetes Sister    Ovarian cancer Sister    Diabetes Sister    Diabetes Brother    Heart attack Brother    Diabetes Brother    Vitals:   08/28/24 0819 08/28/24 0831 08/28/24 0836  BP: (!) 108/90 (!) 70/40 (!) 64/40  Pulse: 86 81   SpO2: 97%    Weight: 262 lb (118.8 kg)     Wt Readings from Last 3 Encounters:  08/28/24 262 lb (118.8 kg)  08/26/24 268 lb 15.4 oz (122 kg)  08/10/24 263 lb 6.4 oz (119.5 kg)     Lab Results  Component Value Date   CREATININE 1.30 (H) 08/26/2024   CREATININE 1.22 08/25/2024   CREATININE 1.04 08/24/2024    PHYSICAL EXAM:  General: Well appearing. HOH Cor: No JVD. Regular rhythm, rate.  Lungs: clear Abdomen: soft, nontender, nondistended. Extremities: no edema Neuro:. Affect pleasant   ECG: not done   ASSESSMENT & PLAN:  1: NICM- - suspect due to AF / OSA/ HTN as [facility] 05/25 showed nonobstructive CAD - NYHA class II - euvolemic - weighing daily. Reviewed parameters to call about - Echo 12/24: EF of 25  to 30% with normal RV cavity size with mildly reduced RVSF - Echo 01/2024: EF of 35 to 40%, global hypokinesis, grade 1 diastolic dysfunction, normal RV systolic function and ventricular cavity size, mild calcification of the aortic valve, and an estimated right atrial pressure of 3 mmHg.   - Echo 08/10/2024 showed EF 20 to 25%  - continue furosemide  20mg  daily. Consider changing this to PRN - decrease irbesartan  to 37.5mg  daily due to hypotension. He has a pill cutter at home - Stop bidil  for now due to hypotension - continue jardiance  25mg  daily (DM dose) - continue metoprolol  succinate 12.5mg  daily - current BP will not allow for MRA. Has also had elevated K+ in the past - BNP 08/23/24 was 544.0  2: Nonobstructive CAD- - [facility] 05/01/2024 showed mild, nonobstructive CAD with 10 to 20% stenoses of the mid LAD and proximal LCx as well as 20 to 30% stenosis of the proximal RCA. Normal left and right heart filling pressure, mild pulm hypertension, and normal cardiac output/index.  - HS-trop 08/25 ranged 20=>33  3: HTN- - BP 108/90, 70/40 and then standing was 64/40 - Stop bidil , decrease irbestan per above. BP log provided. Emphasized changing positions slowly - saw PCP Dois) 05/25 - BMP 08/26/24 reviewed: sodium 140, potassium 3.9, creatinine 1.3, GFR 56  4: AF- - saw cardiology (Dunn) 05/25; returns 09/25 - continue amiodarone  200mg  daily - continue apixaban  5mg  BID - TSH 04/14/24 was 0.762 - will need regular eye exams  5: COPD- - saw pulmonology Alica) 06/25  6: DM- - A1c 04/30/24 was 6.8% - managed by PCP  7: Hyperlipidemia- - LDL 01/27/24 was 72 - continue pravastatin  40mg  daily   Return in 6 weeks, sooner if needed.   Ellouise DELENA Class, FNP 08/27/24

## 2024-08-27 NOTE — Telephone Encounter (Signed)
 Called to confirm/remind patient of their appointment at the Advanced Heart Failure Clinic on 08/28/24.   Appointment:   [] Confirmed  [x] Left mess   [] No answer/No voice mail  [] VM Full/unable to leave message  [] Phone not in service  Patient reminded to bring all medications and/or complete list.  Confirmed patient has transportation. Gave directions, instructed to utilize valet parking.

## 2024-08-28 ENCOUNTER — Emergency Department
Admission: EM | Admit: 2024-08-28 | Discharge: 2024-08-28 | Disposition: A | Discharge status: Home / Self Care | Attending: Emergency Medicine | Admitting: Emergency Medicine

## 2024-08-28 ENCOUNTER — Encounter: Payer: Self-pay | Admitting: Physician Assistant

## 2024-08-28 ENCOUNTER — Telehealth: Payer: Self-pay

## 2024-08-28 ENCOUNTER — Ambulatory Visit: Admitting: Family

## 2024-08-28 ENCOUNTER — Other Ambulatory Visit: Payer: Self-pay

## 2024-08-28 ENCOUNTER — Encounter: Payer: Self-pay | Admitting: Family

## 2024-08-28 VITALS — BP 64/40 | HR 81 | Wt 262.0 lb

## 2024-08-28 DIAGNOSIS — I480 Paroxysmal atrial fibrillation: Secondary | ICD-10-CM

## 2024-08-28 DIAGNOSIS — Z8546 Personal history of malignant neoplasm of prostate: Secondary | ICD-10-CM | POA: Diagnosis not present

## 2024-08-28 DIAGNOSIS — E119 Type 2 diabetes mellitus without complications: Secondary | ICD-10-CM | POA: Diagnosis not present

## 2024-08-28 DIAGNOSIS — Z7901 Long term (current) use of anticoagulants: Secondary | ICD-10-CM | POA: Insufficient documentation

## 2024-08-28 DIAGNOSIS — Z96641 Presence of right artificial hip joint: Secondary | ICD-10-CM | POA: Insufficient documentation

## 2024-08-28 DIAGNOSIS — I509 Heart failure, unspecified: Secondary | ICD-10-CM | POA: Diagnosis not present

## 2024-08-28 DIAGNOSIS — I5020 Unspecified systolic (congestive) heart failure: Secondary | ICD-10-CM | POA: Insufficient documentation

## 2024-08-28 DIAGNOSIS — R42 Dizziness and giddiness: Secondary | ICD-10-CM | POA: Diagnosis present

## 2024-08-28 DIAGNOSIS — I2510 Atherosclerotic heart disease of native coronary artery without angina pectoris: Secondary | ICD-10-CM | POA: Diagnosis not present

## 2024-08-28 DIAGNOSIS — Z86718 Personal history of other venous thrombosis and embolism: Secondary | ICD-10-CM | POA: Diagnosis not present

## 2024-08-28 DIAGNOSIS — F17200 Nicotine dependence, unspecified, uncomplicated: Secondary | ICD-10-CM | POA: Insufficient documentation

## 2024-08-28 DIAGNOSIS — Z79899 Other long term (current) drug therapy: Secondary | ICD-10-CM | POA: Diagnosis not present

## 2024-08-28 DIAGNOSIS — I959 Hypotension, unspecified: Secondary | ICD-10-CM | POA: Insufficient documentation

## 2024-08-28 DIAGNOSIS — I428 Other cardiomyopathies: Secondary | ICD-10-CM | POA: Diagnosis not present

## 2024-08-28 DIAGNOSIS — I10 Essential (primary) hypertension: Secondary | ICD-10-CM

## 2024-08-28 DIAGNOSIS — E782 Mixed hyperlipidemia: Secondary | ICD-10-CM

## 2024-08-28 DIAGNOSIS — J449 Chronic obstructive pulmonary disease, unspecified: Secondary | ICD-10-CM | POA: Diagnosis not present

## 2024-08-28 DIAGNOSIS — Z87891 Personal history of nicotine dependence: Secondary | ICD-10-CM | POA: Diagnosis not present

## 2024-08-28 DIAGNOSIS — N1831 Chronic kidney disease, stage 3a: Secondary | ICD-10-CM

## 2024-08-28 DIAGNOSIS — Z139 Encounter for screening, unspecified: Secondary | ICD-10-CM | POA: Insufficient documentation

## 2024-08-28 DIAGNOSIS — I110 Hypertensive heart disease with heart failure: Secondary | ICD-10-CM | POA: Insufficient documentation

## 2024-08-28 DIAGNOSIS — Z7984 Long term (current) use of oral hypoglycemic drugs: Secondary | ICD-10-CM | POA: Diagnosis not present

## 2024-08-28 DIAGNOSIS — I4891 Unspecified atrial fibrillation: Secondary | ICD-10-CM | POA: Diagnosis not present

## 2024-08-28 DIAGNOSIS — Z96651 Presence of right artificial knee joint: Secondary | ICD-10-CM | POA: Insufficient documentation

## 2024-08-28 DIAGNOSIS — E1122 Type 2 diabetes mellitus with diabetic chronic kidney disease: Secondary | ICD-10-CM

## 2024-08-28 DIAGNOSIS — G4733 Obstructive sleep apnea (adult) (pediatric): Secondary | ICD-10-CM | POA: Diagnosis not present

## 2024-08-28 DIAGNOSIS — E785 Hyperlipidemia, unspecified: Secondary | ICD-10-CM | POA: Diagnosis not present

## 2024-08-28 MED ORDER — IRBESARTAN 75 MG PO TABS: 37.5000 mg | ORAL_TABLET | Freq: Every day | ORAL | 1 refills | Status: DC

## 2024-08-28 NOTE — Discharge Instructions (Signed)
 Please stop use of your new blood pressure medicine BiDil , it is believed this caused you to have low blood pressure and lightheadedness.  Please follow the instructions of the heart failure team who saw you today.  Follow-up closely with your cardiology and heart failure team.  Return to the ER right away if you start to experience lightheadedness, chest pain, passout, feel faint or your symptoms return, or other concerns arise

## 2024-08-28 NOTE — Telephone Encounter (Signed)
 Called pt. Per Dunn, PA pt to report to ED for low blood pressures during the CHF clinic visit. Pt agreeable to reporting to ED. No further questions at this time.

## 2024-08-28 NOTE — ED Triage Notes (Signed)
 Pt sts that he was told to come to the ED from the heart failure clinic after having an apt this AM at 0830 due to having low BP. Pt has no S/S at this time. Pt vs are wnl.

## 2024-08-28 NOTE — ED Provider Notes (Signed)
 [facility] Provider Note    Event Date/Time   First MD Initiated Contact with Patient 08/28/24 1335     (approximate)   History   Hypotension   HPI  Evan [name] is a 79 y.o. 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   Reviewed note from [facility].  Patient presented with hypotension and also some symptoms that sound concerning for associated lightheadedness heart failure clinic today.  He was notably hypotensive and referred to the ER and they made note to stop his BIDIL  and decrease irbesartan    Patient tells me that he was told to come to the ER but all of his symptoms has since gone away.  He reports that he is confident that his new medicine started it.  He took new blood pressure medicine last night and not long after that felt lightheaded and fatigued.  He took it again this morning and the same systems came back before he went to the heart failure clinic.  He reports that although symptoms are now gone  He has no shortness of breath no swelling no chest pain no more lightheadedness no more feeling of fatigue.  He feels well now and reports he is certain it was the blood pressure medicine and he feels like he just wants to go home  Physical Exam   Triage Vital Signs: ED Triage Vitals [08/28/24 1247]  Encounter Vitals Group     BP (!) 150/78     Girls Systolic BP Percentile      Girls Diastolic BP Percentile      Boys Systolic BP Percentile      Boys Diastolic BP Percentile      Pulse Rate 92     Resp 17     Temp 98 F (36.7 C)     Temp Source Oral     SpO2 94 %     Weight 257 lb (116.6 kg)     Height 6' 3 (1.905 m)     Head Circumference      Peak Flow      Pain Score 0     Pain Loc      Pain  Education      Exclude from Growth Chart     Most recent vital signs: Vitals:   08/28/24 1247 08/28/24 1400  BP: (!) 150/78 (!) 128/56  Pulse: 92   Resp: 17   Temp: 98 F (36.7 C)   SpO2: 94%      General: Awake, no distress.  CV:  Good peripheral perfusion.  Normal tones and rate Resp:  Normal effort.  Clear bilateral Abd:  No distention.  Soft nontender Other:  No edema.  No venous cords or congestion.  Patient fully alert well-oriented very pleasant and conversant.   ED Results / Procedures / Treatments   Labs (all labs ordered are listed, but only abnormal results are displayed) Labs Reviewed - No data to display   EKG  Declined by patient   RADIOLOGY     PROCEDURES:  Critical Care performed: No  Procedures   MEDICATIONS ORDERED IN ED: Medications - No data to display   IMPRESSION / MDM / ASSESSMENT AND PLAN / ED COURSE  I reviewed the triage vital signs and the  nursing notes.                              Differential diagnosis includes, but is not limited to, effect of new blood pressure medication anemia electrolyte abnormality cardiac changes, dehydration AKI etc.  The patient reports that all symptoms have gone away and he reports reliably that the symptoms of started within an hour of both doses of his new medication which he reports he has now discontinued.  He was seen at the heart failure clinic and hypotension with lightheaded type symptoms.  No associated symptoms of central neurologic cause no unilateral symptoms, no chest pain no shortness of breath.  Very reassuring nontoxic exam fully oriented here  I discussed with the patient and recommended we do testing at least to make sure that there was not concerns for kidney injury, heart issue, electrolyte abnormality, etc. or anything that could spell that he has damage to his kidneys dehydration or other acute concerning cause.  We discussed obtaining evaluation such as an EKG tracing of his  heart, blood work for his kidneys and electrolytes, checking a blood count etc.  With shared medical decision making the patient declines any further treatment stating that he feels totally fine and is confident the blood pressure medicine was because.  I do agree with him that his blood pressure medication is likely the culprit but strongly recommended additional testing.  With shared medical decision making and understanding that I cannot exclude further concerning causes without testing he still declines.  He wants to follow-up with cardiology and advises he will return if the symptoms come back.  He will no longer take BiDil   His blood pressure is improved here  Patient's presentation is most consistent with acute complicated illness / injury requiring diagnostic workup.  However, patient declines further evaluation.  Provided careful return precautions and encouraged patient to set up close follow-up.  Cardiology team including Bernardino Bring aware of the patient's presentation today to the ER, and patient declining further evaluation at this time.  They will be calling him to set up appropriate follow-up further      FINAL CLINICAL IMPRESSION(S) / ED DIAGNOSES   Final diagnoses:  Encounter for medical screening examination     Rx / DC Orders   ED Discharge Orders     None        Note:  This document was prepared using Dragon voice recognition software and may include unintentional dictation errors.   Dicky Anes, MD 08/28/24 [phone]

## 2024-08-28 NOTE — Telephone Encounter (Signed)
 Called pt to advise him to get blood work per [employer], [place]. Pt stated that he is getting blood work at his follow up appt with [facility] on 09/01/24. He said that we can the lab work from that appt. No further questions at this time.

## 2024-08-28 NOTE — Telephone Encounter (Signed)
 After reading my OV note and with his background knowledge of patient, R. Dunn, PA asks that we call patient and have him go to the ER for further evaluation and hold all his meds until seen. Has a history of hyperkalemia with ARB and since this was recently added,he is concerned about ATN. RN reached patient and patient was agreeable to going to the ED.

## 2024-08-28 NOTE — Patient Instructions (Signed)
 Medication Changes:  STOP Bidil   REDUCE Irbesartan  to 1/2 tab daily   Special Instructions // Education:  Do the following things EVERYDAY: Weigh yourself in the morning before breakfast. Write it down and keep it in a log. Take your medicines as prescribed Eat low salt foods--Limit salt (sodium) to 2000 mg per day.  Stay as active as you can everyday Limit all fluids for the day to less than 2 liters Keep a blood pressure log and bring it to your visits   Follow-Up in: Please follow up with the Advanced Heart Failure Clinic in 6 weeks with Ellouise Class, FNP.   Thank you for choosing [facility] Failure [facility].    At the Advanced Heart Failure Clinic, you and your health needs are our priority. We have a designated team specialized in the treatment of Heart Failure. This Care Team includes your primary Heart Failure Specialized Cardiologist (physician), Advanced Practice Providers (APPs- Physician Assistants and Nurse Practitioners), and Pharmacist who all work together to provide you with the care you need, when you need it.   You may see any of the following providers on your designated Care Team at your next follow up:  Dr. Toribio Fuel Dr. Ezra Shuck Dr. Ria Commander Dr. Morene Brownie Ellouise Class, FNP Jaun Bash, RPH-CPP  Please be sure to bring in all your medications bottles to every appointment.   Need to Contact Us :  If you have any questions or concerns before your next appointment please send us  a message through [place] or call our office at [phone].    TO LEAVE A MESSAGE FOR THE NURSE SELECT OPTION 2, PLEASE LEAVE A MESSAGE INCLUDING: YOUR NAME DATE OF BIRTH CALL BACK NUMBER REASON FOR CALL**this is important as we prioritize the call backs  YOU WILL RECEIVE A CALL BACK THE SAME DAY AS LONG AS YOU CALL BEFORE 4:00 PM

## 2024-08-28 NOTE — ED Notes (Signed)
 Pt in no acute distress prior to discharge. Discharge instructions reviewed and pt verbalized understanding.

## 2024-08-28 NOTE — Progress Notes (Signed)
 I have reviewed the ER documentation.  Patient has previously been managed on BiDil  without ARB as the latter has formally caused hyperkalemia leading to its discontinuation.  It is for this reason we have not managed the patient on MRA or ARNI as well.    My recommendation for the heart failure clinic to reach out to the patient and have him present to the ED, following his heart failure visit with a blood pressure of 64/40, was to follow-up on his blood pressure and obtain labs to ensure there was no significant AKI secondary to ATN in the setting of hypotension with development of hyperkalemia following recently started ARB.  While I am aware of the patient's presentation to the ED I am not in agreement with the decision to not have follow-up testing including labs and EKG.  Treating provider, Dr. Dicky, in the ED was made aware of my recommendation for the patient to at least have follow-up labs.  I was informed that he had discharged the patient 6 minutes after initial Epic chat.

## 2024-09-01 NOTE — Result Encounter Note (Signed)
 Labs reviewed. Patient has upcoming visit and will discuss at that time

## 2024-09-02 NOTE — Progress Notes (Unsigned)
 Cardiology Office Note    Date:  09/03/2024   ID:  Chalmer, Zheng [birth year], MRN [id number]  PCP:  Epifanio Alm SQUIBB, MD  Cardiologist:  Evalene Lunger, MD  Electrophysiologist:  None   Chief Complaint: Hospital follow-up  History of Present Illness:   Evan [name] is a 79 y.o. male with history of nonobstructive CAD by LHC in 04/2024, HFrEF secondary to NICM diagnosed in 12/2023, persistent A-fib status post ablation in 2010, atrial flutter status post cardioversion in 12/2023, DVT/PE in 05/2023, GI bleed, DM2, HTN, HLD, prostate cancer status post radiation, LBBB, prior tobacco use quitting in 1995, and OSA on CPAP who presents for hospital follow-up as outlined below.  He was admitted to [facility] in 12/2023 with A-fib/flutter and new onset HFrEF.  He was diuresed and placed on amiodarone .  He underwent successful cardioversion during admission.  Echo showed an EF of 25 to 30% with normal RV cavity size with mildly reduced RVSF with recommendation for escalation of GDMT.  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  He established care with Dr. Gollan in 01/2024.  Echo in 01/2024 showed an EF of 35 to 40%, global hypokinesis, grade 1 diastolic dysfunction, normal RV systolic function and ventricular cavity size, mild calcification of the aortic valve, and an estimated right atrial pressure of 3 mmHg.   He was seen in the ED in 02/2024 after sustaining a mechanical fall and was prescribed tramadol  for back pain.  He was seen in the ER in 02/2024 dizziness, lightheadedness, back pain, and associated shortness of breath.  High-sensitivity troponin negative x 2.  ProBNP 1303.  Chest x-ray with mildly prominent interstitial markings.   He followed up in our office on 04/14/2024 noting continued shortness of breath that has been present since his hospitalization in 12/2023.  He noted his functional status improved following DCCV, though his shortness of breath persisted.  Weight was stable.   To further evaluate his cardiomyopathy he underwent R/LHC on 05/01/2024 showed mild, nonobstructive CAD with 10 to 20% stenoses of the mid LAD and proximal LCx as well as 20 to 30% stenosis of the proximal RCA.  Normal left and right heart filling pressure, mild pulm hypertension, and normal cardiac output/index.  He was last seen in the office in 04/2024 and was euvolemic, remaining off spironolactone and losartan  with prior hyperkalemia.  Follow-up echo in 07/2024 showed a persistent cardiomyopathy with an EF of 20 to 25%, mild LVH, akinesis of the entire anteroseptal wall, normal RV systolic function and ventricular cavity size, mild mitral regurgitation, and an estimated right atrial pressure of 3 mmHg.  He was admitted to [facility] in 08/2023 with acute on chronic HFrEF with symptomatic improvement with IV diuresis.  BNP 544.  High-sensitivity troponin 20 with a peak and delta of 36.  Chest x-ray with pulmonary edema.  During admission rechallenged with ARB and ultimately discharged on irbesartan  75 mg daily, Jardiance  25 mg daily, BiDil  20/37.5 mg 3 times daily, Toprol -XL 12.5 mg daily, and Lasix  20 mg daily.  He followed up with the [facility] in CHF clinic and was significantly orthostatic with reported dizziness with a blood pressure trending 64/40 with positional change.  In this setting we recommended he proceed to the ED where he was asymptomatic and declined further evaluation.  He comes in doing well from a cardiac perspective and is without symptoms of angina or cardiac decompensation.  Off BiDil  dizziness is improved.  He does continue to note some positional  dizziness.  No falls or symptoms concerning for bleeding.  No lower extremity swelling or progressive orthopnea.  Weight has been stable at home since hospital discharge.  Labs obtained earlier this week showed stable renal function and potassium on ARB.  Feels well at this time.   Labs independently reviewed: 08/2024 - potassium 4.7, BUN 17, serum  creatinine 1.2, albumin 4.1, AST/ALT normal, A1c 6.4 07/2024 - Hgb 14.0, PLT 334, magnesium  2.3 03/2024 - TSH normal 01/2024 - TC 146, TG 45, HDL 65, LDL 72  Past Medical History:  Diagnosis Date   Adenomatous polyp of colon    Arthritis    xDJD   Atrial fibrillation (HCC)    BPH (benign prostatic hyperplasia)    CHF (congestive heart failure) (HCC)    COPD (chronic obstructive pulmonary disease) (HCC)    Diabetes mellitus without complication (HCC)    type 2   Discitis    Diverticulitis    Diverticulosis    DVT (deep venous thrombosis) (HCC)    bilateral tibial   Erectile dysfunction    Hearing loss    Hyperlipidemia    Hypertension    Ischemic optic neuropathy    Lower GI bleeding 02/23/2018   Lumbar degenerative disc disease    Non-ischemic cardiomyopathy (HCC)    Sleep apnea     Past Surgical History:  Procedure Laterality Date   ATRIAL ABLATION SURGERY  2010   CATARACT EXTRACTION W/PHACO Right 10/10/2020   Procedure: CATARACT EXTRACTION PHACO AND INTRAOCULAR LENS PLACEMENT (IOC) RIGHT ISTENT INJ DIABETIC 2.64  00:29.4;  Surgeon: Myrna Adine Anes, MD;  Location: [facility];  Service: Ophthalmology;  Laterality: Right;  Diabetic - oral meds   CATARACT EXTRACTION W/PHACO Left 03/06/2021   Procedure: CATARACT EXTRACTION PHACO AND INTRAOCULAR LENS PLACEMENT (IOC) LEFT DIABETIC ISTENT INJ 3.62 00:30.6;  Surgeon: Myrna Adine Anes, MD;  Location: [facility];  Service: Ophthalmology;  Laterality: Left;  Diabetic - oral meds   CHOLECYSTECTOMY  2011   COLONOSCOPY Left 02/24/2018   Procedure: COLONOSCOPY;  Surgeon: Janalyn Keene NOVAK, MD;  Location: [facility] END[facility]COPY;  Service: Endoscopy;  Laterality: Left;   COLONOSCOPY     2008, 2010, 2013   COLONOSCOPY N/A 04/06/2023   Procedure: COLONOSCOPY;  Surgeon: Toledo, Ladell POUR, MD;  Location: [facility] ENDOSCOPY;  Service: Gastroenterology;  Laterality: N/A;   COLONOSCOPY WITH PROPOFOL  N/A 09/25/2017   Procedure:  COLONOSCOPY WITH PROPOFOL ;  Surgeon: Viktoria Lamar DASEN, MD;  Location: [facility] ENDOSCOPY;  Service: Endoscopy;  Laterality: N/A;   COLONOSCOPY WITH PROPOFOL  N/A 02/18/2018   Procedure: COLONOSCOPY WITH PROPOFOL ;  Surgeon: Jinny Carmine, MD;  Location: [facility] ENDOSCOPY;  Service: Endoscopy;  Laterality: N/A;   ERCP  2011   ESOPHAGOGASTRODUODENOSCOPY Left 02/23/2018   Procedure: ESOPHAGOGASTRODUODENOSCOPY (EGD);  Surgeon: Janalyn Keene NOVAK, MD;  Location: [facility] ENDOSCOPY;  Service: Endoscopy;  Laterality: Left;   ESOPHAGOGASTRODUODENOSCOPY  2014   IVC FILTER INSERTION N/A 04/17/2022   Procedure: IVC FILTER INSERTION;  Surgeon: Jama Cordella MATSU, MD;  Location: [facility] IN[facility];  Service: Cardiovascular;  Laterality: N/A;   IVC FILTER REMOVAL N/A 07/31/2022   Procedure: IVC FILTER REMOVAL;  Surgeon: Jama Cordella MATSU, MD;  Location: [facility];  Service: Cardiovascular;  Laterality: N/A;   LAPAROTOMY  01/01/2017   Procedure: EXPLORATORY LAPAROTOMY;  Surgeon: Aloysius Plant, MD;  Location: [facility] ORS;  Service: General;;   PARTIAL COLECTOMY N/A 01/01/2017   Procedure: PARTIAL COLECTOMY;  Surgeon: Aloysius Plant, MD;  Location: [facility] ORS;  Service:  General;  Laterality: N/A;   PERIPHERAL VASCULAR CATHETERIZATION N/A 12/28/2016   Procedure: Visceral Artery Intervention;  Surgeon: Selinda GORMAN Gu, MD;  Location: [facility];  Service: Cardiovascular;  Laterality: N/A;   RIGHT/LEFT HEART CATH AND CORONARY ANGIOGRAPHY Bilateral 05/01/2024   Procedure: RIGHT/LEFT HEART CATH AND CORONARY ANGIOGRAPHY;  Surgeon: Mady Bruckner, MD;  Location: [facility];  Service: Cardiovascular;  Laterality: Bilateral;   SECONDARY CLOSURE OF WOUND N/A 01/08/2017   Procedure: SECONDARY CLOSURE FOR EVISCERATION;  Surgeon: Charlie FORBES Fell, MD;  Location: [facility] ORS;  Service: General;  Laterality: N/A;   SIGMOIDECTOMY  2018   TOTAL HIP ARTHROPLASTY Right 04/24/2022   Procedure: TOTAL HIP ARTHROPLASTY ANTERIOR  APPROACH;  Surgeon: Kathlynn Sharper, MD;  Location: [facility] ORS;  Service: Orthopedics;  Laterality: Right;   TOTAL KNEE ARTHROPLASTY Right 03/24/2014   times 4    Current Medications: Current Meds  Medication Sig   acetaminophen  (TYLENOL ) 500 MG tablet Take 1,000 mg by mouth every 8 (eight) hours as needed.   albuterol  (VENTOLIN  HFA) 108 (90 Base) MCG/ACT inhaler Inhale 2 puffs into the lungs every 6 (six) hours as needed for shortness of breath or wheezing.   amiodarone  (PACERONE ) 200 MG tablet Take 1 tablet (200 mg total) by mouth daily.   apixaban  (ELIQUIS ) 5 MG TABS tablet Take 1 tablet (5 mg total) by mouth 2 (two) times daily.   ARNUITY ELLIPTA 100 MCG/ACT AEPB Inhale 1 puff into the lungs daily.   Cholecalciferol  (VITAMIN D3) 2000 units capsule Take 2,000 Units by mouth daily.   furosemide  (LASIX ) 20 MG tablet Take 1 tablet (20 mg total) by mouth daily. Take extra 20 mg with 3 pound weight gain   hydrOXYzine  (ATARAX /VISTARIL ) 25 MG tablet Take 25 mg by mouth at bedtime as needed (sleep).   irbesartan  (AVAPRO ) 75 MG tablet Take 0.5 tablets (37.5 mg total) by mouth daily.   JARDIANCE  25 MG TABS tablet Take 25 mg by mouth daily.   metFORMIN  (GLUCOPHAGE -XR) 500 MG 24 hr tablet Take 2 tablets (1,000 mg total) by mouth daily with breakfast.   metoprolol  succinate (TOPROL -XL) 25 MG 24 hr tablet Take 0.5 tablets (12.5 mg total) by mouth daily.   Multiple Vitamins-Minerals (CENTRUM SILVER PO) Take 1 tablet by mouth every morning.   polyethylene glycol (MIRALAX  / GLYCOLAX ) packet Take 17 g by mouth daily. (Patient taking differently: Take 17 g by mouth daily as needed for mild constipation.)   pravastatin  (PRAVACHOL ) 40 MG tablet Take 40 mg by mouth at bedtime.   tamsulosin  (FLOMAX ) 0.4 MG CAPS capsule Take 0.4 mg by mouth daily after supper.   vitamin B-12 (CYANOCOBALAMIN ) 1000 MCG tablet Take 1,000 mcg by mouth daily.    Allergies:   Azithromycin, Penicillins, Lisinopril, Losartan , and  Metoprolol    Social History   Socioeconomic History   Marital status: Married    Spouse name: [name]   Number of children: 1   Years of education: Not on file   Highest education level: Not on file  Occupational History   Not on file  Tobacco Use   Smoking status: Former    Current packs/day: 0.00    Average packs/day: 0.3 packs/day for 29.0 years (7.3 ttl pk-yrs)    Types: Cigarettes    Start date: 12/31/1964    Quit date: 12/31/1993    Years since quitting: 30.6    Passive exposure: Past   Smokeless tobacco: Never  Vaping Use   Vaping status: Never Used  Substance  and Sexual Activity   Alcohol  use: No    Alcohol /week: 0.0 standard drinks of alcohol    Drug use: No   Sexual activity: Not on file  Other Topics Concern   Not on file  Social History Narrative   Lives with Zimmerman, wife.    Social Drivers of Corporate investment banker Strain: Low Risk  (08/24/2024)   Overall Financial Resource Strain (CARDIA)    Difficulty of Paying Living Expenses: Not hard at all  Food Insecurity: No Food Insecurity (08/24/2024)   Hunger Vital Sign    Worried About Running Out of Food in the Last Year: Never true    Ran Out of Food in the Last Year: Never true  Transportation Needs: No Transportation Needs (08/24/2024)   PRAPARE - Administrator, Civil Service (Medical): No    Lack of Transportation (Non-Medical): No  Physical Activity: Not on file  Stress: Not on file  Social Connections: Socially Integrated (08/24/2024)   Social Connection and Isolation Panel    Frequency of Communication with Friends and Family: More than three times a week    Frequency of Social Gatherings with Friends and Family: More than three times a week    Attends Religious Services: More than 4 times per year    Active Member of [employer] or Organizations: Yes    Attends Engineer, structural: More than 4 times per year    Marital Status: Married     Family History:  The patient's family  history includes Diabetes in his brother, brother, sister, and sister; Heart attack in his brother; Ovarian cancer in his sister.  ROS:   12-point review of systems is negative unless otherwise noted in the HPI.   EKGs/Labs/Other Studies Reviewed:    Studies reviewed were summarized above. The additional studies were reviewed today:  Limited echo 08/10/2024: 1. Left ventricular ejection fraction, by estimation, is 20 to 25%. Left  ventricular ejection fraction by 3D volume is 40 %. Left ventricular  ejection fraction by PLAX is 17 %. The left ventricle has severely  decreased function. The left ventricle  demonstrates regional wall motion abnormalities (see scoring  diagram/findings for description). There is mild left ventricular  hypertrophy. There is akinesis of the left ventricular, entire  anteroseptal wall. The average left ventricular global  longitudinal strain is -11.1 %. The global longitudinal strain is  abnormal.   2. Right ventricular systolic function is normal. The right ventricular  size is normal.   3. The mitral valve is normal in structure. Mild mitral valve  regurgitation.   4. The inferior vena cava is normal in size with greater than 50%  respiratory variability, suggesting right atrial pressure of 3 mmHg.  __________  [facility] 05/01/2024: Conclusions: Mild, non-obstructive coronary artery disease with 10-20% stenoses of the mid LAD and proximal LCx as well as 20-30% stenosis of the proximal RCA. Normal left and right heart filling pressures (mean RA 3, RV 42/5, PW 10, LVEDP 15 mmHg). Mild pulmonary hypertension (PA 39/10, mean 20 mmHg). Normal Fick cardiac output/index (CO 8.2 L/min, CI 3.3 L/min/m^2).   Recommendations: Continue medical therapy and risk factor modification to prevent progression of mild coronary artery disease. Maintain net even fluid balance. Escalate goal-directed medical therapy for treatment of nonischemic cardiomyopathy, as tolerated. If  no evidence of bleeding or vascular injury, apixaban  should be restarted tomorrow morning. __________   2D echo 01/28/2024: 1. Left ventricular ejection fraction, by estimation, is 35 to 40%. The  left ventricle has moderately decreased function. The left ventricle  demonstrates global hypokinesis. The left ventricular internal cavity size  was mildly dilated. Left ventricular  diastolic parameters are consistent with Grade I diastolic dysfunction  (impaired relaxation).   2. Right ventricular systolic function is normal. The right ventricular  size is normal.   3. The mitral valve is normal in structure. No evidence of mitral valve  regurgitation. No evidence of mitral stenosis.   4. The aortic valve is tricuspid. There is mild calcification of the  aortic valve. Aortic valve regurgitation is not visualized. No aortic  stenosis is present.   5. The inferior vena cava is normal in size with greater than 50%  respiratory variability, suggesting right atrial pressure of 3 mmHg.  __________   2D echo 12/06/2023 [facility]): Summary   1. Technically difficult study.    2. The left ventricle is mildly dilated in size with mildly increased wall  thickness.   3. The left ventricular systolic function is severely decreased, LVEF is  visually estimated at 25-30%.    4. There is mild mitral valve regurgitation.    5. The aortic valve is probably trileaflet with mildly thickened leaflets  with mildly reduced excursion.    6. The right ventricle is normal in size, with mildly reduced systolic  function.   7. IVC size and inspiratory change suggest elevated right atrial pressure.  (10-20 mmHg).  __________   2D echo 05/20/2023: 1. Left ventricular ejection fraction, by estimation, is 50 to 55%. The  left ventricle has low normal function. Left ventricular endocardial  border not optimally defined to evaluate regional wall motion. The left  ventricular internal cavity size was  mildly dilated.  There is mild left ventricular hypertrophy. Left  ventricular diastolic parameters are indeterminate.   2. Right ventricular systolic function is normal. The right ventricular  size is normal. Tricuspid regurgitation signal is inadequate for assessing  PA pressure.   3. Left atrial size was mildly dilated.   4. Right atrial size was mildly dilated.   5. The mitral valve is normal in structure. No evidence of mitral valve  regurgitation. No evidence of mitral stenosis.   6. The aortic valve is normal in structure. Aortic valve regurgitation is  not visualized. Aortic valve sclerosis/calcification is present, without  any evidence of aortic stenosis.   7. The inferior vena cava is normal in size with greater than 50%  respiratory variability, suggesting right atrial pressure of 3 mmHg.  __________   2D echo 02/24/2018: - Left ventricle: The cavity size was normal. Systolic function was    normal. The estimated ejection fraction was in the range of 55%    to 60%.  - Aortic valve: There was trivial regurgitation. Valve area (VTI):    2.13 cm^2. Valve area (Vmax): 2.61 cm^2. Valve area (Vmean): 2.22    cm^2.  - Mitral valve: Valve area by continuity equation (using LVOT    flow): 2.97 cm^2.  __________   2D echo 11/18/2015 [facility]): INTERPRETATION  NORMAL LEFT VENTRICULAR SYSTOLIC FUNCTION  NORMAL RIGHT VENTRICULAR SYSTOLIC FUNCTION  TRIVIAL REGURGITATION NOTED (See above)  NO VALVULAR STENOSIS  Closest EF: >55% (Estimated)  Mitral: TRIVIAL MR  Tricuspid: TRIVIAL TR  LVH: MILD LVH    EKG:  EKG is ordered today.  The EKG ordered today demonstrates NSR, 73 bpm, first-degree AV block, LBBB, occasional isolated PVCs  Recent Labs: 04/14/2024: ALT 13; TSH 0.762 08/23/2024: B Natriuretic Peptide 544.0 08/26/2024: BUN 26; Creatinine,  Ser 1.30; Hemoglobin 14.0; Magnesium  2.3; Platelets 334; Potassium 3.9; Sodium 140  Recent Lipid Panel    Component Value Date/Time   CHOL 85 08/21/2012  0502   TRIG 74 08/21/2012 0502   HDL 26 (L) 08/21/2012 0502   VLDL 15 08/21/2012 0502   LDLCALC 44 08/21/2012 0502    PHYSICAL EXAM:    VS:  BP (!) 140/70 (BP Location: Left Arm, Patient Position: Sitting, Cuff Size: Normal)   Pulse 73   Ht 6' 3 (1.905 m)   Wt 266 lb 9.6 oz (120.9 kg)   SpO2 97%   BMI 33.32 kg/m   BMI: Body mass index is 33.32 kg/m.  Physical Exam Vitals reviewed.  Constitutional:      Appearance: He is well-developed.  HENT:     Head: Normocephalic and atraumatic.  Eyes:     General:        Right eye: No discharge.        Left eye: No discharge.  Neck:     Vascular: No JVD.  Cardiovascular:     Rate and Rhythm: Normal rate and regular rhythm.     Heart sounds: Normal heart sounds, S1 normal and S2 normal. Heart sounds not distant. No midsystolic click and no opening snap. No murmur heard.    No friction rub.  Pulmonary:     Effort: Pulmonary effort is normal. No respiratory distress.     Breath sounds: Normal breath sounds. No decreased breath sounds, wheezing, rhonchi or rales.  Musculoskeletal:     Cervical back: Normal range of motion.     Right lower leg: No edema.     Left lower leg: No edema.  Skin:    General: Skin is warm and dry.     Nails: There is no clubbing.  Neurological:     Mental Status: He is alert and oriented to person, place, and time.  Psychiatric:        Speech: Speech normal.        Behavior: Behavior normal.        Thought Content: Thought content normal.        Judgment: Judgment normal.     Wt Readings from Last 3 Encounters:  09/03/24 266 lb 9.6 oz (120.9 kg)  08/28/24 257 lb (116.6 kg)  08/28/24 262 lb (118.8 kg)     ASSESSMENT & PLAN:   HFrEF secondary to NICM with LBBB: Euvolemic with NYHA class II symptoms.  Escalation of GDMT has been limited by prior hyperkalemia previously leading to the discontinuation of MRA and ARB as well as dizziness with orthostatic hypotension leading to discontinuation of  BiDil .  Currently on irbesartan  37.5 mg daily, Jardiance  25 mg, and Toprol -XL 12.5 mg daily and furosemide  20 mg daily.  LHC in 04/2024 showed nonobstructive disease as outlined above.  Cardiomyopathy has persisted despite maximally tolerated GDMT.  Referred to EP for consideration of CRT-D given persistent cardiomyopathy and underlying LBBB.  May need cardiac MRI prior.  Recent labs showed stable renal function and potassium.  Nonobstructive CAD: No symptoms suggestive of angina.  Continue aggressive risk factor modification and primary prevention including apixaban  with aspirin  given underlying A-fib along with Toprol -XL and pravastatin .  No indication for further ischemic testing at this time.  Persistent A-fib/flutter: Maintaining sinus rhythm on Toprol -XL 12.5 mg and amiodarone  200 mg.  CHA2DS2-VASc at least 6 (CHF, HTN, age x 2, DM, vascular disease).  Remains on apixaban  5 mg twice daily does not meet reduced dosing criteria.  Recent labs stable, including TSH and LFT.  No falls or symptoms concerning for bleeding.  HTN: Blood pressure is reasonably controlled in the office today.  Pharmacotherapy as above.  HLD: LDL 72 in 01/2024.  Pravastatin .    Disposition: F/u with Dr. Gollan or an APP in 3 months.   Medication Adjustments/Labs and Tests Ordered: Current medicines are reviewed at length with the patient today.  Concerns regarding medicines are outlined above. Medication changes, Labs and Tests ordered today are summarized above and listed in the Patient Instructions accessible in Encounters.   SignedBernardino Bring, PA-C 09/03/2024 12:04 PM      [facility] [address] [place], [place] [address] [phone]

## 2024-09-03 ENCOUNTER — Ambulatory Visit: Attending: Physician Assistant | Admitting: Physician Assistant

## 2024-09-03 ENCOUNTER — Encounter: Payer: Self-pay | Admitting: Physician Assistant

## 2024-09-03 VITALS — BP 140/70 | HR 73 | Ht 75.0 in | Wt 266.6 lb

## 2024-09-03 DIAGNOSIS — I447 Left bundle-branch block, unspecified: Secondary | ICD-10-CM | POA: Diagnosis not present

## 2024-09-03 DIAGNOSIS — I2510 Atherosclerotic heart disease of native coronary artery without angina pectoris: Secondary | ICD-10-CM

## 2024-09-03 DIAGNOSIS — I5020 Unspecified systolic (congestive) heart failure: Secondary | ICD-10-CM

## 2024-09-03 DIAGNOSIS — I428 Other cardiomyopathies: Secondary | ICD-10-CM

## 2024-09-03 DIAGNOSIS — I4819 Other persistent atrial fibrillation: Secondary | ICD-10-CM

## 2024-09-03 DIAGNOSIS — I10 Essential (primary) hypertension: Secondary | ICD-10-CM

## 2024-09-03 DIAGNOSIS — E785 Hyperlipidemia, unspecified: Secondary | ICD-10-CM

## 2024-09-03 NOTE — Patient Instructions (Signed)
 Medication Instructions:  Your physician recommends that you continue on your current medications as directed. Please refer to the Current Medication list given to you today.   *If you need a refill on your cardiac medications before your next appointment, please call your pharmacy*  Lab Work: None ordered at this time   Follow-Up: At [facility], you and your health needs are our priority.  As part of our continuing mission to provide you with exceptional heart care, our providers are all part of one team.  This team includes your primary Cardiologist (physician) and Advanced Practice Providers or APPs (Physician Assistants and Nurse Practitioners) who all work together to provide you with the care you need, when you need it.  Your next appointment:   3 month(s) after appointment with EP  Provider:   You may see Timothy Gollan, MD or Bernardino Bring, PA-C  We recommend signing up for the patient portal called MyChart.  Sign up information is provided on this After Visit Summary.  MyChart is used to connect with patients for Virtual Visits (Telemedicine).  Patients are able to view lab/test results, encounter notes, upcoming appointments, etc.  Non-urgent messages can be sent to your provider as well.   To learn more about what you can do with MyChart, go to [email or web address].

## 2024-10-07 ENCOUNTER — Ambulatory Visit: Admitting: Cardiology

## 2024-10-12 ENCOUNTER — Encounter: Payer: Self-pay | Admitting: Oncology

## 2024-10-20 ENCOUNTER — Other Ambulatory Visit: Payer: Self-pay

## 2024-10-20 ENCOUNTER — Telehealth: Payer: Self-pay | Admitting: Family

## 2024-10-20 ENCOUNTER — Encounter: Admitting: Family

## 2024-10-20 ENCOUNTER — Ambulatory Visit: Attending: Cardiology | Admitting: Cardiology

## 2024-10-20 ENCOUNTER — Encounter: Payer: Self-pay | Admitting: Cardiology

## 2024-10-20 VITALS — BP 158/74 | HR 84 | Ht 75.0 in | Wt 259.2 lb

## 2024-10-20 DIAGNOSIS — I5020 Unspecified systolic (congestive) heart failure: Secondary | ICD-10-CM

## 2024-10-20 DIAGNOSIS — I428 Other cardiomyopathies: Secondary | ICD-10-CM | POA: Diagnosis not present

## 2024-10-20 DIAGNOSIS — I447 Left bundle-branch block, unspecified: Secondary | ICD-10-CM | POA: Diagnosis not present

## 2024-10-20 DIAGNOSIS — I4819 Other persistent atrial fibrillation: Secondary | ICD-10-CM | POA: Diagnosis not present

## 2024-10-20 DIAGNOSIS — I5022 Chronic systolic (congestive) heart failure: Secondary | ICD-10-CM

## 2024-10-20 DIAGNOSIS — D6869 Other thrombophilia: Secondary | ICD-10-CM

## 2024-10-20 NOTE — Progress Notes (Unsigned)
 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  Admitted to [facility] in 12/2023 with A-fib/flutter and new onset HFrEF. He was diuresed and placed on amiodarone . He underwent successful cardioversion during admission. Echo showed an EF of 25 to 30% with normal RV cavity size with mildly reduced RVSF with recommendation for escalation of GDMT. 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   Echo 01/2024 showed an EF of 35 to 40%, global hypokinesis, grade 1 diastolic dysfunction, normal RV systolic function and ventricular cavity size, mild calcification of the aortic valve, and an estimated right atrial pressure of 3 mmHg.   Seen in the ED in 02/2024 after sustaining a mechanical fall and was prescribed tramadol  for back pain. Seen in the ER in 02/2024 dizziness, lightheadedness, back pain, and associated shortness of breath. High-sensitivity troponin negative x 2. ProBNP 1303.  Chest x-ray with mildly prominent interstitial markings.   [facility] 05/01/2024 showed mild, nonobstructive CAD with 10 to 20% stenoses of the mid LAD and proximal LCx as well as 20 to 30% stenosis of the proximal RCA. Normal left and right heart filling pressure, mild pulm hypertension, and normal cardiac output/index.   Echo 08/10/2024 showed EF 20 to 25%   Admitted 08/23/24 with shortness of breath and chest tightness with 5 pound weight gain. BNP 544. IV diuresed with resultant loss of 5L. Spironolactone held due to hyperkalemia. Irbesartan / bidil  started.   Was in the ED  08/28/24 due to hypotension/ dizziness. Meds adjusted at clinic visit earlier in day (bidil  stopped/ irbesartan  decreased) and his symptoms resolved after getting to the ER.   He presents today for a HF follow-up visit with a chief complaint of fatigue. Says that he feels tired all the time. Besides feeling fatigued, he says that he feels great. Goes to the gym 4 days / week for 1.5 hours at a time. Says that he will be having cMRI completed and then a pacemaker implanted 11/16/24.  Received his flu / covid / RSV vaccines for this season.    ROS: All systems negative except what is listed in HPI, PMH and Problem List   Past Medical History:  Diagnosis Date   Adenomatous polyp of colon    Arthritis    xDJD   Atrial fibrillation (HCC)    BPH (benign prostatic hyperplasia)    CHF (congestive heart failure) (HCC)    COPD (chronic obstructive pulmonary disease) (HCC)    Diabetes mellitus without complication (HCC)    type 2   Discitis    Diverticulitis    Diverticulosis    DVT (deep venous thrombosis) (HCC)    bilateral tibial   Erectile dysfunction    Hearing loss    Hyperlipidemia    Hypertension    Ischemic optic neuropathy    Lower GI bleeding 02/23/2018   Lumbar degenerative disc disease    Non-ischemic cardiomyopathy (HCC)    Sleep apnea     Current Outpatient Medications  Medication Sig Dispense Refill   acetaminophen  (TYLENOL ) 500 MG tablet Take 1,000 mg by mouth every 8 (  eight) hours as needed.     albuterol  (VENTOLIN  HFA) 108 (90 Base) MCG/ACT inhaler Inhale 2 puffs into the lungs every 6 (six) hours as needed for shortness of breath or wheezing.     amiodarone  (PACERONE ) 200 MG tablet Take 1 tablet (200 mg total) by mouth daily. 90 tablet 1   apixaban  (ELIQUIS ) 5 MG TABS tablet Take 1 tablet (5 mg total) by mouth 2 (two) times daily. 60 tablet 1   ARNUITY ELLIPTA 100 MCG/ACT AEPB Inhale 1 puff into the lungs daily.     Cholecalciferol  (VITAMIN D3) 2000 units  capsule Take 2,000 Units by mouth daily.     furosemide  (LASIX ) 20 MG tablet Take 1 tablet (20 mg total) by mouth daily. Take extra 20 mg with 3 pound weight gain 30 tablet 11   hydrOXYzine  (ATARAX /VISTARIL ) 25 MG tablet Take 25 mg by mouth at bedtime as needed (sleep).     irbesartan  (AVAPRO ) 75 MG tablet Take 0.5 tablets (37.5 mg total) by mouth daily. 45 tablet 1   JARDIANCE  25 MG TABS tablet Take 25 mg by mouth daily.     metFORMIN  (GLUCOPHAGE -XR) 500 MG 24 hr tablet Take 2 tablets (1,000 mg total) by mouth daily with breakfast.     metoprolol  succinate (TOPROL -XL) 25 MG 24 hr tablet Take 0.5 tablets (12.5 mg total) by mouth daily. 45 tablet 3   Multiple Vitamins-Minerals (CENTRUM SILVER PO) Take 1 tablet by mouth every morning.     polyethylene glycol (MIRALAX  / GLYCOLAX ) packet Take 17 g by mouth daily. (Patient taking differently: Take 17 g by mouth daily as needed for mild constipation.) 14 each 0   pravastatin  (PRAVACHOL ) 40 MG tablet Take 40 mg by mouth at bedtime.     tamsulosin  (FLOMAX ) 0.4 MG CAPS capsule TAKE 1 CAPSULE BY MOUTH ONCE DAILY AFTERSUPPER 90 capsule 4   vitamin B-12 (CYANOCOBALAMIN ) 1000 MCG tablet Take 1,000 mcg by mouth daily.     No current facility-administered medications for this visit.    Allergies  Allergen Reactions   Azithromycin Rash   Penicillins Rash   Lisinopril Cough   Losartan  Cough   Metoprolol  Other (See Comments) and Rash    Bradycardia.      Social History   Socioeconomic History   Marital status: Married    Spouse name: [name]   Number of children: 1   Years of education: Not on file   Highest education level: Not on file  Occupational History   Not on file  Tobacco Use   Smoking status: Former    Current packs/day: 0.00    Average packs/day: 0.3 packs/day for 29.0 years (7.3 ttl pk-yrs)    Types: Cigarettes    Start date: 12/31/1964    Quit date: 12/31/1993    Years since quitting: 30.8    Passive exposure: Past   Smokeless  tobacco: Never  Vaping Use   Vaping status: Never Used  Substance and Sexual Activity   Alcohol  use: No    Alcohol /week: 0.0 standard drinks of alcohol    Drug use: No   Sexual activity: Not on file  Other Topics Concern   Not on file  Social History Narrative   Lives with [place], wife.    Social Drivers of Corporate investment banker Strain: Low Risk  (09/08/2024)   Received from [facility]   Overall Financial Resource Strain (CARDIA)    Difficulty of Paying Living Expenses: Not hard at all  Food Insecurity: No Food  Insecurity (09/08/2024)   Received from [facility]   Hunger Vital Sign    Within the past 12 months, you worried that your food would run out before you got the money to buy more.: Never true    Within the past 12 months, the food you bought just didn't last and you didn't have money to get more.: Never true  Transportation Needs: No Transportation Needs (09/08/2024)   Received from [facility] - Transportation    In the past 12 months, has lack of transportation kept you from medical appointments or from getting medications?: No    Lack of Transportation (Non-Medical): No  Physical Activity: Not on file  Stress: Not on file  Social Connections: Socially Integrated (08/24/2024)   Social Connection and Isolation Panel    Frequency of Communication with Friends and Family: More than three times a week    Frequency of Social Gatherings with Friends and Family: More than three times a week    Attends Religious Services: More than 4 times per year    Active Member of [employer] or Organizations: Yes    Attends Banker Meetings: More than 4 times per year    Marital Status: Married  Catering manager Violence: Not At Risk (08/24/2024)   Humiliation, Afraid, Rape, and Kick questionnaire    Fear of Current or Ex-Partner: No    Emotionally Abused: No    Physically Abused: No    Sexually Abused: No       Family History  Problem Relation Age of Onset   Diabetes Sister    Ovarian cancer Sister    Diabetes Sister    Diabetes Brother    Heart attack Brother    Diabetes Brother    Vitals:   10/21/24 0831  BP: 127/71  Pulse: 81  SpO2: 99%  Weight: 266 lb (120.7 kg)  Height: 6' 3 (1.905 m)   Wt Readings from Last 3 Encounters:  10/21/24 266 lb (120.7 kg)  10/20/24 259 lb 3.2 oz (117.6 kg)  09/03/24 266 lb 9.6 oz (120.9 kg)   Lab Results  Component Value Date   CREATININE 1.30 (H) 08/26/2024   CREATININE 1.22 08/25/2024   CREATININE 1.04 08/24/2024    PHYSICAL EXAM:  General: Well appearing. HOH  Cor: No JVD. Regular rhythm, rate.  Lungs: clear Abdomen: soft, nontender, nondistended. Extremities: trace edema bilaterally around ankles Neuro:. Affect pleasant    ECG: not done   ASSESSMENT & PLAN:  1: NICM with reduced EF- - suspect due to AF / LBBB as [facility] 05/25 showed nonobstructive CAD - NYHA class II - euvolemic - weight up 4 pounds from last visit here 2 months ago - Echo 12/24: EF of 25 to 30% with normal RV cavity size with mildly reduced RVSF - Echo 01/2024: EF of 35 to 40%, global hypokinesis, grade 1 diastolic dysfunction, normal RV systolic function and ventricular cavity size, mild calcification of the aortic valve, and an estimated right atrial pressure of 3 mmHg.   - Echo 08/10/2024 showed EF 20 to 25%  - continue furosemide  20mg  daily. - continue irbesartan  37.5mg  daily  - continue jardiance  25mg  daily (DM dose) - continue metoprolol  succinate 12.5mg  daily - may not be a good candidate for MRA as he's been hyperkalemic in the past - bidil  previously stopped due to hypotension - BNP 08/23/24 was 544.0  2: Nonobstructive CAD- - [facility] 05/01/2024 showed mild, nonobstructive CAD with 10 to 20% stenoses  of the mid LAD and proximal LCx as well as 20 to 30% stenosis of the proximal RCA. Normal left and right heart filling pressure, mild pulm hypertension, and  normal cardiac output/index.  - HS-trop 08/25 ranged 20=>33 - saw cardiology (Dunn) 09/25  3: HTN- - BP 127/71 - saw PCP Dois) 09/25 - BMP 09/01/24 reviewed: sodium 141, potassium 4.7, creatinine 1.2, GFR 61  4: AF- - ablation 2010 - DCCV 12/24 - saw EP Anice) 10/25. Plan for CRT pacemaker 11/16/24, cMRI ordered - continue amiodarone  200mg  daily - continue apixaban  5mg  BID - TSH 04/14/24 was 0.762 - will need regular eye exams  5: COPD- - saw pulmonology Alica) 06/25  6: DM- - A1c 09/01/24 was 6.4% - managed by PCP  7: Hyperlipidemia- - LDL 01/27/24 was 72 - continue pravastatin  40mg  daily   Return in 3 months, sooner if needed. At that time, if patient is doing well, we will change appointments to PRN.   I spent 40 minutes reviewing records, interviewing/ examing patient and managing plan/ orders.   Ellouise DELENA Class, FNP 10/20/24

## 2024-10-20 NOTE — Telephone Encounter (Signed)
 Called to confirm/remind patient of their appointment at the Advanced Heart Failure Clinic on 10/21/24.   Appointment:   [] Confirmed  [x] Left mess   [] No answer/No voice mail  [] VM Full/unable to leave message  [] Phone not in service  Patient reminded to bring all medications and/or complete list.  Confirmed patient has transportation. Gave directions, instructed to utilize valet parking.

## 2024-10-20 NOTE — Progress Notes (Signed)
 Electrophysiology Office Note:   Date:  10/20/2024  ID:  Evan [name], DOB 1945-01-28, MRN [id number]  Primary Cardiologist: Evalene Lunger, MD Electrophysiologist: [name], MD      History of Present Illness:   Evan [name] is a 79 y.o. 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  Discussed the use of AI scribe software for clinical note transcription with the patient, who gave verbal consent to proceed.  History of Present Illness KENYON [name] is an 79 year old male with heart failure and atrial fibrillation who presents for evaluation of heart function and potential treatment options.  He has a history of heart failure with reduced ejection fraction, first noted in December 2024, with an ejection fraction of 25-30% on echocardiogram. Prior to this, in 2019, his heart function was normal. He experiences decreased energy levels since his heart function declined, although he remains active.  He attributes some of his limitations in physical activity to knee issues rather than cardiac symptoms.  He has a history of atrial fibrillation and flutter, for which he was started on amiodarone  in December 2024. Since starting amiodarone , he has not experienced any known episodes of atrial fibrillation or flutter, and his EKGs have consistently shown normal rhythm throughout 2025. He continues to take amiodarone .  He also has a history of a left bundle branch block, first noted in 2024. He has not had any prior discussions about this condition until now.  He is on Eliquis  twice daily due to a history of atrial fibrillation and a previous blood clot in his leg. He has not experienced any  bleeding issues while on this medication.  He underwent an ablation procedure in 2010 for atrial fibrillation, performed by Dr. Franky Ned at [facility], and has not had significant issues with atrial fibrillation since then until the recent decline in heart function.    Review of systems complete and found to be negative unless listed in HPI.   EP Information / Studies Reviewed:    EKG is not ordered today. EKG from 09/03/2024 reviewed which showed sinus rhythm with first-degree AV block and left bundle branch block, PR 268 ms and QRS 172 ms.  Occasional PVCs.      Echo 08/10/2024: 1. Left ventricular ejection fraction, by estimation, is 20 to 25%. Left  ventricular ejection fraction by 3D volume is 40 %. Left ventricular  ejection fraction by PLAX is 17 %. The left ventricle has severely  decreased function. The left ventricle  demonstrates regional wall motion abnormalities (see scoring  diagram/findings for description). There is mild left ventricular  hypertrophy. There is akinesis of the left ventricular, entire  anteroseptal wall. The average left ventricular global  longitudinal strain is -11.1 %. The global longitudinal strain is  abnormal.   2. Right ventricular systolic function is normal. The right ventricular  size is normal.   3. The mitral valve is normal in structure. Mild mitral valve  regurgitation.   4. The inferior vena cava is normal in size with greater than 50%  respiratory variability, suggesting right atrial pressure of 3 mmHg.   RHC/LHC 05/01/24:  Conclusions: Mild, non-obstructive coronary artery disease with 10-20% stenoses of the mid LAD and proximal LCx  as well as 20-30% stenosis of the proximal RCA. Normal left and right heart filling pressures (mean RA 3, RV 42/5, PW 10, LVEDP 15 mmHg). Mild pulmonary hypertension (PA 39/10, mean 20 mmHg). Normal Fick cardiac output/index (CO 8.2 L/min, CI 3.3 L/min/m^2).       Physical Exam:   VS:  BP (!) 158/74    Pulse 84   Ht 6' 3 (1.905 m)   Wt 259 lb 3.2 oz (117.6 kg)   SpO2 97%   BMI 32.40 kg/m    Wt Readings from Last 3 Encounters:  10/20/24 259 lb 3.2 oz (117.6 kg)  09/03/24 266 lb 9.6 oz (120.9 kg)  08/28/24 257 lb (116.6 kg)     GEN: Well nourished, well developed in no acute distress NECK: No JVD CARDIAC: Normal rate, regular rhythm RESPIRATORY:  Clear to auscultation without rales, wheezing or rhonchi  ABDOMEN: Soft, non-distended EXTREMITIES:  No edema; No deformity   ASSESSMENT AND PLAN:   [name] is a 79 year old male with past medical history notable for chronic systolic heart failure and nonischemic cardiomyopathy.  It appears that his LVEF has markedly declined since developing a left bundle branch block in early of 2024.  He subsequently began having more atrial fibrillation/flutter later in 2024, after decline of his LVEF, and was started on amiodarone .  Amiodarone  has been relatively successful in maintaining sinus rhythm.  Despite maintenance of sinus rhythm, LVEF has not improved; thus, I am less suspicious for a tachy-arrhythmia mediated cardiomyopathy. He does note some decline in functional status after developing heart failure, but tries to maintain high level of activity.  #.  Chronic systolic heart failure: NYHA class II #.  Nonischemic cardiomyopathy #.  Left bundle branch block -I am suspicious that primary etiology of his cardiomyopathy is left bundle branch block.  Patient meets criteria for CRT in setting of persistently low LVEF despite optimal medical therapy and left bundle branch block greater than 160 ms.  We discussed both CRT-P and CRT-D, but at patient's age of 71, it was decided that CRT-P would be most appropriate. Explained risks, benefits, and alternatives to CRT-P implantation, including but not limited to bleeding, infection, damage to heart or lungs, heart attack, stroke, or death.  Pt verbalized understanding and agrees to proceed. -We will perform  cardiac MRI prior to CRT-P implantation to assess for alternative etiologies of his nonischemic cardiomyopathy. - Continue GDMT regimen of irbesartan , Jardiance , metoprolol  XL.  Follow-up with our HF clinic.  #.  Persistent atrial fibrillation/flutter: Status post remote catheter ablation.  Given presence of systolic heart failure, we have prioritize a rhythm control strategy. #.  High risk medication use: Amiodarone . - Continue amiodarone  200 mg once daily. - Continue Eliquis  5 mg twice daily.  Denies bleeding issues.   Follow up with Dr. Kennyth 3 months after CRT-P implant.   Signed, Fonda Kennyth, MD

## 2024-10-20 NOTE — Patient Instructions (Signed)
 Medication Instructions:  Your physician recommends that you continue on your current medications as directed. Please refer to the Current Medication list given to you today.  *If you need a refill on your cardiac medications before your next appointment, please call your pharmacy*  Lab Work: BMET and CBC  - please have these done at any [facility] location the first week in November.   Testing/Procedures: Cardiac MRI Your physician has requested that you have a cardiac MRI. Cardiac MRI uses a computer to create images of your heart as its beating, producing both still and moving pictures of your heart and major blood vessels. For further information please visit [email or web address]. Please follow the instruction sheet given to you today for more information.   Pacemaker Implant Your physician has recommended that you have a pacemaker inserted. A pacemaker is a small device that is placed under the skin of your chest or abdomen to help control abnormal heart rhythms. This device uses electrical pulses to prompt the heart to beat at a normal rate. Pacemakers are used to treat heart rhythms that are too slow. Wire (leads) are attached to the pacemaker that goes into the chambers of you heart. This is done in the hospital and usually requires and overnight stay. Please see the instruction sheet given to you today for more information.  Follow-Up: At [facility], you and your health needs are our priority.  As part of our continuing mission to provide you with exceptional heart care, our providers are all part of one team.  This team includes your primary Cardiologist (physician) and Advanced Practice Providers or APPs (Physician Assistants and Nurse Practitioners) who all work together to provide you with the care you need, when you need it.  Your next appointment:   We will call you to arrange your post-procedure follow up.     You are scheduled for Cardiac MRI at the location below.   Please arrive for your appointment at ______________ . ?  [facility] [address] [place], [place] [address] Please take advantage of the free valet parking available at the [facility] and [employer] (Entrance C).  Proceed to the [facility] Radiology Department (First Floor) for check-in.   OR   [facility] [address] [place], [place] [address] Please go to the [facility] and check-in with the desk attendant.   Magnetic resonance imaging (MRI) is a painless test that produces images of the inside of the body without using Xrays.  During an MRI, strong magnets and radio waves work together in a Data processing manager to form detailed images.   MRI images may provide more details about a medical condition than X-rays, CT scans, and ultrasounds can provide.  You may be given earphones to listen for instructions.  You may eat a light breakfast and take medications as ordered with the exception of furosemide , hydrochlorothiazide, chlorthalidone or spironolactone (or any other fluid pill). If you are undergoing a stress MRI, please avoid stimulants for 12 hr prior to test. (I.e. Caffeine, nicotine, chocolate, or antihistamine medications)  If your provider has ordered anti-anxiety medications for this test, then you will need a driver.  An IV will be inserted into one of your veins. Contrast material will be injected into your IV. It will leave your body through your urine within a day. You may be told to drink plenty of fluids to help flush the contrast material out of your system.  You will be asked to  remove all metal, including: Watch, jewelry, and other metal objects including hearing aids, hair pieces and dentures. Also wearable glucose monitoring systems (ie. Freestyle Libre and Omnipods) (Braces and fillings normally are not a problem.)   TEST WILL TAKE APPROXIMATELY 1 HOUR  PLEASE NOTIFY SCHEDULING AT LEAST 24 HOURS IN ADVANCE IF YOU  ARE UNABLE TO KEEP YOUR APPOINTMENT. [phone]  For more information and frequently asked questions, please visit our website : [email or web address]  Please call the Cardiac Imaging Nurse Navigators with any questions/concerns. [phone] Office

## 2024-10-20 NOTE — H&P (View-Only) (Signed)
 Electrophysiology Office Note:   Date:  10/20/2024  ID:  [name], DOB 1945-01-28, MRN [id number]  Primary Cardiologist: Evalene Lunger, MD Electrophysiologist: [name], MD      History of Present Illness:   LARRELL RAPOZO is a 79 y.o. 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  Discussed the use of AI scribe software for clinical note transcription with the patient, who gave verbal consent to proceed.  History of Present Illness [name] is an 79 year old male with heart failure and atrial fibrillation who presents for evaluation of heart function and potential treatment options.  He has a history of heart failure with reduced ejection fraction, first noted in December 2024, with an ejection fraction of 25-30% on echocardiogram. Prior to this, in 2019, his heart function was normal. He experiences decreased energy levels since his heart function declined, although he remains active.  He attributes some of his limitations in physical activity to knee issues rather than cardiac symptoms.  He has a history of atrial fibrillation and flutter, for which he was started on amiodarone  in December 2024. Since starting amiodarone , he has not experienced any known episodes of atrial fibrillation or flutter, and his EKGs have consistently shown normal rhythm throughout 2025. He continues to take amiodarone .  He also has a history of a left bundle branch block, first noted in 2024. He has not had any prior discussions about this condition until now.  He is on Eliquis  twice daily due to a history of atrial fibrillation and a previous blood clot in his leg. He has not experienced any  bleeding issues while on this medication.  He underwent an ablation procedure in 2010 for atrial fibrillation, performed by Dr. Franky Ned at [facility], and has not had significant issues with atrial fibrillation since then until the recent decline in heart function.    Review of systems complete and found to be negative unless listed in HPI.   EP Information / Studies Reviewed:    EKG is not ordered today. EKG from 09/03/2024 reviewed which showed sinus rhythm with first-degree AV block and left bundle branch block, PR 268 ms and QRS 172 ms.  Occasional PVCs.      Echo 08/10/2024: 1. Left ventricular ejection fraction, by estimation, is 20 to 25%. Left  ventricular ejection fraction by 3D volume is 40 %. Left ventricular  ejection fraction by PLAX is 17 %. The left ventricle has severely  decreased function. The left ventricle  demonstrates regional wall motion abnormalities (see scoring  diagram/findings for description). There is mild left ventricular  hypertrophy. There is akinesis of the left ventricular, entire  anteroseptal wall. The average left ventricular global  longitudinal strain is -11.1 %. The global longitudinal strain is  abnormal.   2. Right ventricular systolic function is normal. The right ventricular  size is normal.   3. The mitral valve is normal in structure. Mild mitral valve  regurgitation.   4. The inferior vena cava is normal in size with greater than 50%  respiratory variability, suggesting right atrial pressure of 3 mmHg.   RHC/LHC 05/01/24:  Conclusions: Mild, non-obstructive coronary artery disease with 10-20% stenoses of the mid LAD and proximal LCx  as well as 20-30% stenosis of the proximal RCA. Normal left and right heart filling pressures (mean RA 3, RV 42/5, PW 10, LVEDP 15 mmHg). Mild pulmonary hypertension (PA 39/10, mean 20 mmHg). Normal Fick cardiac output/index (CO 8.2 L/min, CI 3.3 L/min/m^2).       Physical Exam:   VS:  BP (!) 158/74    Pulse 84   Ht 6' 3 (1.905 m)   Wt 259 lb 3.2 oz (117.6 kg)   SpO2 97%   BMI 32.40 kg/m    Wt Readings from Last 3 Encounters:  10/20/24 259 lb 3.2 oz (117.6 kg)  09/03/24 266 lb 9.6 oz (120.9 kg)  08/28/24 257 lb (116.6 kg)     GEN: Well nourished, well developed in no acute distress NECK: No JVD CARDIAC: Normal rate, regular rhythm RESPIRATORY:  Clear to auscultation without rales, wheezing or rhonchi  ABDOMEN: Soft, non-distended EXTREMITIES:  No edema; No deformity   ASSESSMENT AND PLAN:   Mr. [name] is a 79 year old male with past medical history notable for chronic systolic heart failure and nonischemic cardiomyopathy.  It appears that his LVEF has markedly declined since developing a left bundle branch block in early of 2024.  He subsequently began having more atrial fibrillation/flutter later in 2024, after decline of his LVEF, and was started on amiodarone .  Amiodarone  has been relatively successful in maintaining sinus rhythm.  Despite maintenance of sinus rhythm, LVEF has not improved; thus, I am less suspicious for a tachy-arrhythmia mediated cardiomyopathy. He does note some decline in functional status after developing heart failure, but tries to maintain high level of activity.  #.  Chronic systolic heart failure: NYHA class II #.  Nonischemic cardiomyopathy #.  Left bundle branch block -I am suspicious that primary etiology of his cardiomyopathy is left bundle branch block.  Patient meets criteria for CRT in setting of persistently low LVEF despite optimal medical therapy and left bundle branch block greater than 160 ms.  We discussed both CRT-P and CRT-D, but at patient's age of 71, it was decided that CRT-P would be most appropriate. Explained risks, benefits, and alternatives to CRT-P implantation, including but not limited to bleeding, infection, damage to heart or lungs, heart attack, stroke, or death.  Pt verbalized understanding and agrees to proceed. -We will perform  cardiac MRI prior to CRT-P implantation to assess for alternative etiologies of his nonischemic cardiomyopathy. - Continue GDMT regimen of irbesartan , Jardiance , metoprolol  XL.  Follow-up with our HF clinic.  #.  Persistent atrial fibrillation/flutter: Status post remote catheter ablation.  Given presence of systolic heart failure, we have prioritize a rhythm control strategy. #.  High risk medication use: Amiodarone . - Continue amiodarone  200 mg once daily. - Continue Eliquis  5 mg twice daily.  Denies bleeding issues.   Follow up with Dr. Kennyth 3 months after CRT-P implant.   Signed, Fonda Kennyth, MD

## 2024-10-21 ENCOUNTER — Encounter: Payer: Self-pay | Admitting: Family

## 2024-10-21 ENCOUNTER — Ambulatory Visit: Attending: Family | Admitting: Family

## 2024-10-21 VITALS — BP 127/71 | HR 81 | Ht 75.0 in | Wt 266.0 lb

## 2024-10-21 DIAGNOSIS — Z833 Family history of diabetes mellitus: Secondary | ICD-10-CM | POA: Diagnosis not present

## 2024-10-21 DIAGNOSIS — Z950 Presence of cardiac pacemaker: Secondary | ICD-10-CM | POA: Diagnosis not present

## 2024-10-21 DIAGNOSIS — Z8249 Family history of ischemic heart disease and other diseases of the circulatory system: Secondary | ICD-10-CM | POA: Diagnosis not present

## 2024-10-21 DIAGNOSIS — Z8546 Personal history of malignant neoplasm of prostate: Secondary | ICD-10-CM | POA: Insufficient documentation

## 2024-10-21 DIAGNOSIS — I2510 Atherosclerotic heart disease of native coronary artery without angina pectoris: Secondary | ICD-10-CM | POA: Diagnosis not present

## 2024-10-21 DIAGNOSIS — Z7901 Long term (current) use of anticoagulants: Secondary | ICD-10-CM | POA: Diagnosis not present

## 2024-10-21 DIAGNOSIS — I110 Hypertensive heart disease with heart failure: Secondary | ICD-10-CM | POA: Diagnosis present

## 2024-10-21 DIAGNOSIS — I10 Essential (primary) hypertension: Secondary | ICD-10-CM

## 2024-10-21 DIAGNOSIS — E669 Obesity, unspecified: Secondary | ICD-10-CM | POA: Insufficient documentation

## 2024-10-21 DIAGNOSIS — Z7984 Long term (current) use of oral hypoglycemic drugs: Secondary | ICD-10-CM | POA: Insufficient documentation

## 2024-10-21 DIAGNOSIS — Z86718 Personal history of other venous thrombosis and embolism: Secondary | ICD-10-CM | POA: Diagnosis not present

## 2024-10-21 DIAGNOSIS — Z87891 Personal history of nicotine dependence: Secondary | ICD-10-CM | POA: Diagnosis not present

## 2024-10-21 DIAGNOSIS — I4891 Unspecified atrial fibrillation: Secondary | ICD-10-CM | POA: Diagnosis not present

## 2024-10-21 DIAGNOSIS — E785 Hyperlipidemia, unspecified: Secondary | ICD-10-CM | POA: Diagnosis not present

## 2024-10-21 DIAGNOSIS — I480 Paroxysmal atrial fibrillation: Secondary | ICD-10-CM | POA: Diagnosis not present

## 2024-10-21 DIAGNOSIS — Z79899 Other long term (current) drug therapy: Secondary | ICD-10-CM | POA: Insufficient documentation

## 2024-10-21 DIAGNOSIS — N400 Enlarged prostate without lower urinary tract symptoms: Secondary | ICD-10-CM | POA: Insufficient documentation

## 2024-10-21 DIAGNOSIS — E119 Type 2 diabetes mellitus without complications: Secondary | ICD-10-CM | POA: Insufficient documentation

## 2024-10-21 DIAGNOSIS — J449 Chronic obstructive pulmonary disease, unspecified: Secondary | ICD-10-CM | POA: Insufficient documentation

## 2024-10-21 DIAGNOSIS — I447 Left bundle-branch block, unspecified: Secondary | ICD-10-CM | POA: Diagnosis not present

## 2024-10-21 DIAGNOSIS — I428 Other cardiomyopathies: Secondary | ICD-10-CM | POA: Insufficient documentation

## 2024-10-21 DIAGNOSIS — Z86711 Personal history of pulmonary embolism: Secondary | ICD-10-CM | POA: Diagnosis not present

## 2024-10-21 DIAGNOSIS — I5020 Unspecified systolic (congestive) heart failure: Secondary | ICD-10-CM | POA: Diagnosis not present

## 2024-10-21 DIAGNOSIS — E1122 Type 2 diabetes mellitus with diabetic chronic kidney disease: Secondary | ICD-10-CM

## 2024-10-21 DIAGNOSIS — E782 Mixed hyperlipidemia: Secondary | ICD-10-CM

## 2024-10-21 DIAGNOSIS — G4733 Obstructive sleep apnea (adult) (pediatric): Secondary | ICD-10-CM | POA: Diagnosis not present

## 2024-10-21 DIAGNOSIS — N1831 Chronic kidney disease, stage 3a: Secondary | ICD-10-CM

## 2024-10-21 NOTE — Patient Instructions (Signed)
 It was good to see you today!

## 2024-10-26 ENCOUNTER — Telehealth: Payer: Self-pay

## 2024-10-26 NOTE — Telephone Encounter (Signed)
-----   Message from Nurse Doreatha C sent at 10/21/2024 10:28 AM EDT ----- Regarding: 11/16/24 CRTP implant @[facility]  Precert:  MD: Kennyth Type of implant: CRT-P Device manufacturer: Abbott Diagnosis: Bradycardia CPT code: CRT-P implant - 66791+66774 C-code(s), including quantity (if indicated): C2621, R8101 (2), C1900  Procedure scheduled (date/time): 11/16/14 at 1:00pm at [facility]  Procedure:  Scrub given? Yes  Medication instructions:  Message sent to CVRR? No Added to calendar? Yes Orders entered? Yes Letter complete? Yes Scheduled with cath lab? Yes Labs ordered (CBC, BMET, PT/INR if on warfarin)? Yes Dye allergy? No Pre-meds ordered and instructions given? No, not needed Letter method: given in clinic Special instructions: no H&P: 10/21  Follow-up:  Cassie/Angel, please schedule Routine.  Covering RN:  Please send this message to [employer], EP scheduler, EP Scheduling pool, and EP [employer].

## 2024-10-27 ENCOUNTER — Telehealth (HOSPITAL_COMMUNITY): Payer: Self-pay

## 2024-10-27 NOTE — Telephone Encounter (Signed)
 Spoke with patient to discuss upcoming procedure.   Confirmed patient is scheduled for a Biventricular permanent transvenous pacemaker on Monday, November 17 with Dr. Dr. Kennyth. Instructed patient to arrive at [facility] entrance, [address] [place], [place], [place]. [address] and check in at front desk at 12:00 PM.   Labs to be completed by 11/3  Any recent signs of acute illness or been started on antibiotics? No  Any new medications started? No Any medications to hold? Yes  HOLD Jardiance  for 3 days prior to your procedure. Last dose on November 13. HOLD Eliquis  (Apixaban ) and Metformin  for 2 days prior to your procedure. Your last dose will be Friday, November 14. Medication instructions:  On the morning of your procedure HOLD Lasix  (furosemide ) the morning of your procedure. Take all other morning medications not discussed with a sip of water.  No eating or drinking after midnight prior to procedure.   The night before your procedure and the morning of your procedure, wash thoroughly with the CHG surgical soap from the neck down, paying special attention to the area where your procedure will be performed.  Advised of plan to go home the same day and will only stay overnight if medically necessary. You MUST have a responsible adult to drive you home and MUST be with you the first 24 hours after you arrive home.  Informed a nurse may call a day before the procedure to confirm arrival time and ensure instructions are followed.  Advised to contact RN Navigator at [phone], to inform of any new medications started after call or concerns prior to procedure.  Patient verbalized understanding to all instructions provided and agreed to proceed with procedure.

## 2024-11-03 LAB — CBC WITH DIFFERENTIAL/PLATELET
Basophils Absolute: 0 x10E3/uL (ref 0.0–0.2)
Basos: 1 %
EOS (ABSOLUTE): 0.1 x10E3/uL (ref 0.0–0.4)
Eos: 1 %
Hematocrit: 47.8 % (ref 37.5–51.0)
Hemoglobin: 14.6 g/dL (ref 13.0–17.7)
Immature Grans (Abs): 0 x10E3/uL (ref 0.0–0.1)
Immature Granulocytes: 0 %
Lymphocytes Absolute: 1.6 x10E3/uL (ref 0.7–3.1)
Lymphs: 31 %
MCH: 25 pg — ABNORMAL LOW (ref 26.6–33.0)
MCHC: 30.5 g/dL — ABNORMAL LOW (ref 31.5–35.7)
MCV: 82 fL (ref 79–97)
Monocytes Absolute: 0.6 x10E3/uL (ref 0.1–0.9)
Monocytes: 11 %
Neutrophils Absolute: 2.9 x10E3/uL (ref 1.4–7.0)
Neutrophils: 56 %
Platelets: 352 x10E3/uL (ref 150–450)
RBC: 5.84 x10E6/uL — ABNORMAL HIGH (ref 4.14–5.80)
RDW: 16.1 % — ABNORMAL HIGH (ref 11.6–15.4)
WBC: 5.2 x10E3/uL (ref 3.4–10.8)

## 2024-11-03 LAB — BASIC METABOLIC PANEL WITH GFR
BUN/Creatinine Ratio: 11 (ref 10–24)
BUN: 13 mg/dL (ref 8–27)
CO2: 26 mmol/L (ref 20–29)
Calcium: 9.7 mg/dL (ref 8.6–10.2)
Chloride: 100 mmol/L (ref 96–106)
Creatinine, Ser: 1.23 mg/dL (ref 0.76–1.27)
Glucose: 84 mg/dL (ref 70–99)
Potassium: 5 mmol/L (ref 3.5–5.2)
Sodium: 141 mmol/L (ref 134–144)
eGFR: 59 mL/min/1.73 — ABNORMAL LOW (ref >59)

## 2024-11-04 ENCOUNTER — Other Ambulatory Visit: Payer: Self-pay | Admitting: Cardiology

## 2024-11-04 ENCOUNTER — Ambulatory Visit
Admission: RE | Admit: 2024-11-04 | Discharge: 2024-11-04 | Disposition: A | Discharge status: Home / Self Care | Source: Ambulatory Visit | Attending: Cardiology | Admitting: Cardiology

## 2024-11-04 ENCOUNTER — Ambulatory Visit: Payer: Self-pay | Admitting: Cardiology

## 2024-11-04 DIAGNOSIS — I447 Left bundle-branch block, unspecified: Secondary | ICD-10-CM | POA: Insufficient documentation

## 2024-11-04 DIAGNOSIS — I5022 Chronic systolic (congestive) heart failure: Secondary | ICD-10-CM

## 2024-11-04 MED ORDER — GADOBUTROL 1 MMOL/ML IV SOLN
15.0000 mL | Freq: Once | INTRAVENOUS | Status: AC | PRN
  Administered 2024-11-04: 15 mL via INTRAVENOUS

## 2024-11-09 ENCOUNTER — Encounter: Payer: Self-pay | Admitting: Emergency Medicine

## 2024-11-16 ENCOUNTER — Ambulatory Visit
Admission: RE | Admit: 2024-11-16 | Discharge: 2024-11-17 | Disposition: A | Discharge status: Home / Self Care | Attending: Cardiology | Admitting: Cardiology

## 2024-11-16 ENCOUNTER — Encounter: Payer: Self-pay | Admitting: Cardiology

## 2024-11-16 ENCOUNTER — Encounter
Admission: RE | Disposition: A | Discharge status: Home / Self Care | Payer: Self-pay | Source: Home / Self Care | Attending: Cardiology

## 2024-11-16 ENCOUNTER — Other Ambulatory Visit: Payer: Self-pay

## 2024-11-16 DIAGNOSIS — I5020 Unspecified systolic (congestive) heart failure: Secondary | ICD-10-CM

## 2024-11-16 DIAGNOSIS — I5022 Chronic systolic (congestive) heart failure: Secondary | ICD-10-CM | POA: Insufficient documentation

## 2024-11-16 DIAGNOSIS — Z7901 Long term (current) use of anticoagulants: Secondary | ICD-10-CM | POA: Insufficient documentation

## 2024-11-16 DIAGNOSIS — I4892 Unspecified atrial flutter: Secondary | ICD-10-CM | POA: Insufficient documentation

## 2024-11-16 DIAGNOSIS — I428 Other cardiomyopathies: Secondary | ICD-10-CM | POA: Diagnosis not present

## 2024-11-16 DIAGNOSIS — I4819 Other persistent atrial fibrillation: Secondary | ICD-10-CM | POA: Insufficient documentation

## 2024-11-16 DIAGNOSIS — Z79899 Other long term (current) drug therapy: Secondary | ICD-10-CM | POA: Diagnosis not present

## 2024-11-16 DIAGNOSIS — Z86711 Personal history of pulmonary embolism: Secondary | ICD-10-CM | POA: Diagnosis not present

## 2024-11-16 DIAGNOSIS — Z950 Presence of cardiac pacemaker: Secondary | ICD-10-CM | POA: Diagnosis present

## 2024-11-16 DIAGNOSIS — E785 Hyperlipidemia, unspecified: Secondary | ICD-10-CM | POA: Diagnosis not present

## 2024-11-16 DIAGNOSIS — I110 Hypertensive heart disease with heart failure: Secondary | ICD-10-CM | POA: Diagnosis present

## 2024-11-16 DIAGNOSIS — Z86718 Personal history of other venous thrombosis and embolism: Secondary | ICD-10-CM | POA: Diagnosis not present

## 2024-11-16 DIAGNOSIS — I447 Left bundle-branch block, unspecified: Secondary | ICD-10-CM | POA: Insufficient documentation

## 2024-11-16 DIAGNOSIS — J449 Chronic obstructive pulmonary disease, unspecified: Secondary | ICD-10-CM | POA: Insufficient documentation

## 2024-11-16 DIAGNOSIS — I2510 Atherosclerotic heart disease of native coronary artery without angina pectoris: Secondary | ICD-10-CM | POA: Insufficient documentation

## 2024-11-16 DIAGNOSIS — G4733 Obstructive sleep apnea (adult) (pediatric): Secondary | ICD-10-CM | POA: Diagnosis not present

## 2024-11-16 DIAGNOSIS — E119 Type 2 diabetes mellitus without complications: Secondary | ICD-10-CM | POA: Insufficient documentation

## 2024-11-16 LAB — GLUCOSE, CAPILLARY
Glucose-Capillary: 70 mg/dL (ref 70–99)
Glucose-Capillary: 89 mg/dL (ref 70–99)

## 2024-11-16 SURGERY — BIV PACEMAKER INSERTION CRT-P
Anesthesia: Moderate Sedation

## 2024-11-16 MED ORDER — SODIUM CHLORIDE 0.9 % IV SOLN
INTRAVENOUS | Status: DC | PRN
  Administered 2024-11-16: 50 mL/h via INTRAVENOUS

## 2024-11-16 MED ORDER — HEPARIN (PORCINE) IN NACL 1000-0.9 UT/500ML-% IV SOLN
INTRAVENOUS | Status: DC | PRN
  Administered 2024-11-16: 500 mL

## 2024-11-16 MED ORDER — MIDAZOLAM HCL (PF) 2 MG/2ML IJ SOLN
INTRAMUSCULAR | Status: DC | PRN
  Administered 2024-11-16: .5 mg via INTRAVENOUS
  Administered 2024-11-16: 2 mg via INTRAVENOUS

## 2024-11-16 MED ORDER — TAMSULOSIN HCL 0.4 MG PO CAPS
0.4000 mg | ORAL_CAPSULE | Freq: Every day | ORAL | Status: DC
  Administered 2024-11-16: 0.4 mg via ORAL
  Filled 2024-11-16: qty 1

## 2024-11-16 MED ORDER — FENTANYL CITRATE (PF) 100 MCG/2ML IJ SOLN
INTRAMUSCULAR | Status: AC
  Filled 2024-11-16: qty 2

## 2024-11-16 MED ORDER — MIDAZOLAM HCL 2 MG/2ML IJ SOLN
INTRAMUSCULAR | Status: AC
  Filled 2024-11-16: qty 2

## 2024-11-16 MED ORDER — ALBUTEROL SULFATE (2.5 MG/3ML) 0.083% IN NEBU: 2.5000 mg | INHALATION_SOLUTION | Freq: Four times a day (QID) | RESPIRATORY_TRACT | Status: DC | PRN

## 2024-11-16 MED ORDER — SODIUM CHLORIDE 0.9 % IV SOLN: INTRAVENOUS | Status: DC

## 2024-11-16 MED ORDER — CEFAZOLIN SODIUM-DEXTROSE 2-4 GM/100ML-% IV SOLN
2.0000 g | INTRAVENOUS | Status: DC
  Filled 2024-11-16: qty 100

## 2024-11-16 MED ORDER — IOHEXOL 300 MG/ML  SOLN
INTRAMUSCULAR | Status: DC | PRN
  Administered 2024-11-16: 10 mL

## 2024-11-16 MED ORDER — ACETAMINOPHEN 325 MG PO TABS: 325.0000 mg | ORAL_TABLET | ORAL | Status: DC | PRN

## 2024-11-16 MED ORDER — FENTANYL CITRATE (PF) 100 MCG/2ML IJ SOLN
INTRAMUSCULAR | Status: DC | PRN
  Administered 2024-11-16: 100 ug via INTRAVENOUS
  Administered 2024-11-16: 12.5 ug via INTRAVENOUS

## 2024-11-16 MED ORDER — METOPROLOL SUCCINATE ER 25 MG PO TB24
12.5000 mg | ORAL_TABLET | Freq: Every day | ORAL | Status: DC
  Administered 2024-11-17: 12.5 mg via ORAL
  Filled 2024-11-16: qty 1

## 2024-11-16 MED ORDER — LIDOCAINE HCL (PF) 1 % IJ SOLN
INTRAMUSCULAR | Status: DC | PRN
  Administered 2024-11-16: 30 mL

## 2024-11-16 MED ORDER — PRAVASTATIN SODIUM 40 MG PO TABS
40.0000 mg | ORAL_TABLET | Freq: Every day | ORAL | Status: DC
  Administered 2024-11-16: 40 mg via ORAL
  Filled 2024-11-16: qty 1

## 2024-11-16 MED ORDER — CEFAZOLIN SODIUM-DEXTROSE 1-4 GM/50ML-% IV SOLN
INTRAVENOUS | Status: DC | PRN
  Administered 2024-11-16: 2 g via INTRAVENOUS

## 2024-11-16 MED ORDER — AMIODARONE HCL 200 MG PO TABS
200.0000 mg | ORAL_TABLET | Freq: Every day | ORAL | Status: DC
  Administered 2024-11-16 – 2024-11-17 (×2): 200 mg via ORAL
  Filled 2024-11-16 (×2): qty 1

## 2024-11-16 MED ORDER — POVIDONE-IODINE 10 % EX SWAB
2.0000 | Freq: Once | CUTANEOUS | Status: AC
  Administered 2024-11-16: 2 via TOPICAL

## 2024-11-16 MED ORDER — LIDOCAINE HCL (PF) 1 % IJ SOLN
INTRAMUSCULAR | Status: AC
  Filled 2024-11-16: qty 90

## 2024-11-16 MED ORDER — CHLORHEXIDINE GLUCONATE 4 % EX SOLN
4.0000 | Freq: Once | CUTANEOUS | Status: AC
Start: 2024-11-16 — End: 2024-11-16
  Administered 2024-11-16: 4 via TOPICAL

## 2024-11-16 MED ORDER — IRBESARTAN 75 MG PO TABS
37.5000 mg | ORAL_TABLET | Freq: Every day | ORAL | Status: DC
  Administered 2024-11-17: 37.5 mg via ORAL
  Filled 2024-11-16: qty 0.5

## 2024-11-16 MED ORDER — ONDANSETRON HCL 4 MG/2ML IJ SOLN: 4.0000 mg | Freq: Four times a day (QID) | INTRAMUSCULAR | Status: DC | PRN

## 2024-11-16 MED ORDER — FLUTICASONE FUROATE-VILANTEROL 100-25 MCG/ACT IN AEPB
1.0000 | INHALATION_SPRAY | Freq: Every day | RESPIRATORY_TRACT | Status: DC
  Filled 2024-11-16: qty 28

## 2024-11-16 MED ORDER — SODIUM CHLORIDE 0.9 % IV SOLN
80.0000 mg | INTRAVENOUS | Status: AC
  Administered 2024-11-16: 80 mg
  Filled 2024-11-16: qty 2

## 2024-11-16 MED ORDER — METFORMIN HCL ER 500 MG PO TB24
500.0000 mg | ORAL_TABLET | Freq: Two times a day (BID) | ORAL | Status: DC
  Administered 2024-11-17: 500 mg via ORAL
  Filled 2024-11-16: qty 1

## 2024-11-16 MED ORDER — EMPAGLIFLOZIN 25 MG PO TABS
25.0000 mg | ORAL_TABLET | Freq: Every day | ORAL | Status: DC
  Administered 2024-11-17: 25 mg via ORAL
  Filled 2024-11-16: qty 1

## 2024-11-16 MED ORDER — HEPARIN (PORCINE) IN NACL 1000-0.9 UT/500ML-% IV SOLN
INTRAVENOUS | Status: AC
  Filled 2024-11-16: qty 500

## 2024-11-16 SURGICAL SUPPLY — 20 items
CABLE SURG 12 DISP A/V CHANNEL (MISCELLANEOUS) IMPLANT
CATH CPS DIRECT 135 DS2C020 (CATHETERS) IMPLANT
CATH CPS LOCATOR 3D MED (CATHETERS) IMPLANT
DEVICE DSSCT PLSMBLD 3.0S LGHT (MISCELLANEOUS) IMPLANT
LEAD QUARTET 1458Q-86CM (Lead) IMPLANT
LEAD ULTIPACE 52 LPA1231/52 (Lead) IMPLANT
LEAD ULTIPACE 65 LPA1231/65 (Lead) IMPLANT
PACEMAKER QUDR ALLR CRT PM3562 (Pacemaker) IMPLANT
PAD ELECT DEFIB RADIOL ZOLL (MISCELLANEOUS) IMPLANT
SHEATH 7FR PRELUDE SNAP 13 (SHEATH) IMPLANT
SHEATH 9.5FR PRELUDE SNAP 13 (SHEATH) IMPLANT
SHEATH 9FR PRELUDE SNAP 13 (SHEATH) IMPLANT
SLITTER AGILIS HISPRO (INSTRUMENTS) ×1 IMPLANT
SUT ETHIBOND CT1 BRD #0 30IN (SUTURE) IMPLANT
SUT MNCRL AB 4-0 PS2 18 (SUTURE) IMPLANT
SUT VIC AB 2-0 CT1 TAPERPNT 27 (SUTURE) IMPLANT
TOOL HELIX LOCKING (MISCELLANEOUS) IMPLANT
TRAY PACEMAKER INSERTION (PACKS) ×1 IMPLANT
WIRE HI TORQ WHISPER MS 190CM (WIRE) IMPLANT
WIRE HITORQ VERSACORE ST 145CM (WIRE) IMPLANT

## 2024-11-16 NOTE — Interval H&P Note (Signed)
 History and Physical Interval Note:  11/16/2024 3:20 PM  Evan [name]  has presented today for surgery, with the diagnosis of chronic systolic heart failure and left bundle branch block.  The various methods of treatment have been discussed with the patient and family. After consideration of risks, benefits and other options for treatment, the patient has consented to  Procedure(s): BIV PACEMAKER INSERTION CRT-P (N/A) as a surgical intervention.  The patient's history has been reviewed, patient examined, no change in status, stable for surgery.  I have reviewed the patient's chart and labs.  Questions were answered to the patient's satisfaction.     [name]

## 2024-11-17 ENCOUNTER — Telehealth: Payer: Self-pay | Admitting: Cardiology

## 2024-11-17 ENCOUNTER — Encounter: Payer: Self-pay | Admitting: Cardiology

## 2024-11-17 ENCOUNTER — Ambulatory Visit

## 2024-11-17 DIAGNOSIS — I110 Hypertensive heart disease with heart failure: Secondary | ICD-10-CM | POA: Diagnosis not present

## 2024-11-17 NOTE — Telephone Encounter (Signed)
 Follow-up after same day discharge: Implant date: 11/16/2024  MD: [name], MD  Device: ABBOTT CRT-P   Location: Left Chest   Wound check visit: 12/04/2024 with Chantal Needle, NP in [place]  90 day MD follow-up: 02/16/25 with Dr. Kitty in [place]   Remote Transmission received: Scheduled to come in the next 2 days - flagging for CV solutions review to ensure it comes.   Dressing/sling removed: yes  Confirm OAC restart on: Restart should be on Friday 11/20/24  Please continue to monitor your cardiac device site for redness, swelling, and drainage. Call the device clinic at [phone] if you experience these symptoms, fever/chills, or have questions about your device.   Remote monitoring is used to monitor your cardiac device from home. This monitoring is scheduled every 91 days by our office. It allows us  to keep an eye on the functioning of your device to ensure it is working properly.    Patient just had questions regarding his remote monitor.  We addressed all questions/concerns with same day follow up call.  Patient appreciates the outreach.

## 2024-11-17 NOTE — Telephone Encounter (Signed)
 Patient wants a call back to discuss his new CRT-P machine.

## 2024-11-17 NOTE — Discharge Summary (Addendum)
 ELECTROPHYSIOLOGY PROCEDURE DISCHARGE SUMMARY    Patient ID: Evan [name],  MRN: [id number], DOB/AGE: [birth year] 79 y.o.  Admit date: 11/16/2024 Discharge date: 11/17/2024  Primary Care Physician: Epifanio Alm SQUIBB, MD  Primary Cardiologist: Evalene Lunger, MD  Electrophysiologist: Dr. Kennyth   Primary Discharge Diagnosis:  HFrEF  Secondary Discharge Diagnosis:  LBBB AFib PE/DVT   Allergies  Allergen Reactions   Azithromycin Rash   Penicillins Rash   Lisinopril Cough   Losartan  Cough   Metoprolol  Other (See Comments) and Rash    Bradycardia.     Procedures This Admission:  1.  Implantation of a Abbott CRT PPM on 11/16/2024 by Dr. Kennyth. The patient received a Abbott Quadra Allure MP PM3562  with a Abbott Ultipace 1231-52 right atrial lead, Abbott Ultipace 1231-65 right ventricular lead, and a Abbott Quartet 1458Q left ventricular lead.  There were no immediate post procedure complications.   2.  CXR on 11/17/2024 demonstrated no pneumothorax status post device implantation.       Brief HPI: Evan [name] is a 79 y.o. male was referred to electrophysiology in the outpatient setting for  consideration of CRT-P implantation.  Past medical history includes non-obs CAD, HFrEF, HTN, HLD, T2DM, COPD, afib, PE/DVT on eliquis , OSA on CPAP, LBBB.  The patient has had HFrEF with LBBB without reversible causes identified.  Risks, benefits, and alternatives to CRT-P implantation were reviewed with the patient who wished to proceed.   Hospital Course:  The patient was admitted and underwent implantation of a Abbott CRT-P with details as outlined above.  He was monitored on telemetry overnight which demonstrated appropriate pacing.  Left chest was without hematoma or ecchymosis.  The device was interrogated and found to be functioning normally.  CXR was obtained and demonstrated no pneumothorax status post device implantation.  Wound care, arm mobility, and  restrictions were reviewed with the patient.  The patient was examined and considered stable for discharge to home.    Anticoagulation resumption This patient should resume their Eliquis  on Friday November 20, 2024    Physical Exam: Vitals:   11/16/24 1907 11/17/24 0007 11/17/24 0407 11/17/24 0818  BP: (!) 152/87 136/79 135/77 137/86  Pulse: 75 65 65 73  Resp: 18 20 20    Temp: 97.7 F (36.5 C) 98.4 F (36.9 C) 97.9 F (36.6 C) 97.9 F (36.6 C)  TempSrc: Oral   Oral  SpO2: 95% 96% 97% 96%    GEN- NAD. A&O x 3.  HEENT: Normocephalic, atraumatic Lungs- CTAB, Normal effort.  Heart- RRR, No M/G/R.  GI- Soft, NT, ND.  Extremities- No clubbing, cyanosis, or edema;  Skin- warm and dry, no rash or lesion, left chest without hematoma/ecchymosis  Discharge Medications:  Allergies as of 11/17/2024       Reactions   Azithromycin Rash   Penicillins Rash   Lisinopril Cough   Losartan  Cough   Metoprolol  Other (See Comments), Rash   Bradycardia.        Medication List     PAUSE taking these medications    apixaban  5 MG Tabs tablet Wait to take this until: November 20, 2024 Morning Commonly known as: ELIQUIS  Take 1 tablet (5 mg total) by mouth 2 (two) times daily.       TAKE these medications    acetaminophen  500 MG tablet Commonly known as: TYLENOL  Take 1,000 mg by mouth every 8 (eight) hours as needed for moderate pain (pain score 4-6).   albuterol  108 (90 Base) MCG/ACT  inhaler Commonly known as: VENTOLIN  HFA Inhale 2 puffs into the lungs every 6 (six) hours as needed for shortness of breath or wheezing.   amiodarone  200 MG tablet Commonly known as: PACERONE  Take 1 tablet (200 mg total) by mouth daily.   Arnuity Ellipta 100 MCG/ACT Aepb Generic drug: Fluticasone Furoate Inhale 1 puff into the lungs daily.   CENTRUM SILVER PO Take 1 tablet by mouth every morning.   cyanocobalamin  1000 MCG tablet Commonly known as: VITAMIN B12 Take 1,000 mcg by mouth  daily.   furosemide  20 MG tablet Commonly known as: LASIX  Take 1 tablet (20 mg total) by mouth daily. Take extra 20 mg with 3 pound weight gain   hydrOXYzine  25 MG tablet Commonly known as: ATARAX  Take 25 mg by mouth at bedtime as needed (sleep).   irbesartan  75 MG tablet Commonly known as: AVAPRO  Take 0.5 tablets (37.5 mg total) by mouth daily.   Jardiance  25 MG Tabs tablet Generic drug: empagliflozin  Take 25 mg by mouth daily.   metFORMIN  500 MG 24 hr tablet Commonly known as: GLUCOPHAGE -XR Take 2 tablets (1,000 mg total) by mouth daily with breakfast. What changed:  how much to take when to take this   metoprolol  succinate 25 MG 24 hr tablet Commonly known as: TOPROL -XL Take 0.5 tablets (12.5 mg total) by mouth daily.   polyethylene glycol 17 g packet Commonly known as: MIRALAX  / GLYCOLAX  Take 17 g by mouth daily. What changed:  when to take this reasons to take this   pravastatin  40 MG tablet Commonly known as: PRAVACHOL  Take 40 mg by mouth at bedtime.   tamsulosin  0.4 MG Caps capsule Commonly known as: FLOMAX  TAKE 1 CAPSULE BY MOUTH ONCE DAILY AFTERSUPPER   Vitamin D3 50 MCG (2000 UT) capsule Take 2,000 Units by mouth daily.        Disposition:  Home with usual follow up as in AVS   Duration of Discharge Encounter:  APP time: 25 minutes  Signed, Suzann Riddle, NP  11/17/2024 8:28 AM   I have seen the patient via virtual consultation today.  The patient expressed their consent to participate in today's virtual consult. The patient was located at [facility] while I was located at [facility].  I have examined the patient and reviewed the above assessment and plan.    Hospital Course: Patient presented on 11/16/2024 for planned CRT-P implant.  He underwent successful implantation of LOT-CRT.  He was monitored overnight per usual protocol.  There were no acute overnight events.  He patient reported feeling relatively  well on morning of discharge.  He was discharged with follow-up visits arranged.   Assessment and Plan: #.  S/p CRT-P - Chest x-ray with appropriate lead positions and no pneumothorax. -Bedside device interrogation was performed today with appropriate device function and stable lead parameters. -Usual post implant discharge instructions were provided regarding activity restrictions and wound care. -Follow-up in device clinic in approximately 10 to 14 days. -Resume anticoagulation on 11/20/2024.  Patient is in sinus rhythm.  #.  Chronic systolic heart failure: NYHA class II #.  Nonischemic cardiomyopathy #.  Left bundle branch block - Continue GDMT regimen of irbesartan , Jardiance , metoprolol  XL.  Follow-up with our HF clinic.   #.  Persistent atrial fibrillation/flutter: Status post remote catheter ablation.  Given presence of systolic heart failure, we have prioritize a rhythm control strategy. #.  High risk medication use: Amiodarone . - Continue amiodarone  200 mg once daily. - Resume Eliquis   on 11/20/2024.  Caregility video conferencing was used to complete today's consult.  [name], MD 11/17/2024 10:51 PM

## 2024-11-17 NOTE — Plan of Care (Signed)

## 2024-11-17 NOTE — Discharge Instructions (Addendum)
 After Your Pacemaker   You have a Abbott Pacemaker  If you have a Medtronic or Biotronik device, plug in your home monitor once you get home, and no manual interaction is required.   If you have an Abbott or [employer] device, plug your home monitor once you get home, sit near the device, and press the large activation button. Sit nearby until the process is complete, usually notated by lights on the monitor.   If you were set up for monitoring using an app on your phone, make sure the app remains open in the background and the Bluetooth remains on.  ACTIVITY Do not lift your arm above shoulder height for 1 week after your procedure. After 7 days, you may progress as below.  You should remove your sling 24 hours after your procedure, unless otherwise instructed by your provider.     Tuesday November 24, 2024  Wednesday November 25, 2024 Thursday November 26, 2024 Friday November 27, 2024   Do not lift, push, pull, or carry anything over 10 pounds with the affected arm until 6 weeks (Tuesday December 29, 2024 ) after your procedure.   You may drive AFTER your wound check, unless you have been told otherwise by your provider.   Ask your healthcare provider when you can go back to work   INCISION/Dressing Resume Eliquis  on Friday, 11/20/2024 with morning dose.  If large square, outer bandage is left in place, this can be removed after 24 hours from your procedure. Do not remove steri-strips or glue as below.   If a PRESSURE DRESSING (a bulky dressing that usually goes up over your shoulder) was applied or left in place, please follow instructions given by your provider on when to return to have this removed.   Monitor your Pacemaker site for redness, swelling, and drainage. Call the device clinic at [phone] if you experience these symptoms or fever/chills.  If your incision is sealed with Steri-strips or staples, you may shower 7 days after your procedure or when told by  your provider. Do not remove the steri-strips or let the shower hit directly on your site. You may wash around your site with soap and water.    If you were discharged in a sling, please do not wear this during the day more than 48 hours after your surgery unless otherwise instructed. This may increase the risk of stiffness and soreness in your shoulder.   Avoid lotions, ointments, or perfumes over your incision until it is well-healed.  You may use a hot tub or a pool AFTER your wound check appointment if the incision is completely closed.  Pacemaker Alerts:  Some alerts are vibratory and others beep. These are NOT emergencies. Please call our office to let us  know. If this occurs at night or on weekends, it can wait until the next business day. Send a remote transmission.  If your device is capable of reading fluid status (for heart failure), you will be offered monthly monitoring to review this with you.   DEVICE MANAGEMENT Remote monitoring is used to monitor your pacemaker from home. This monitoring is scheduled every 91 days by our office. It allows us  to keep an eye on the functioning of your device to ensure it is working properly. You will routinely see your Electrophysiologist annually (more often if necessary).  This will appear as a REMOTE check on your MyChart schedule. These are automatic and there is nothing for you to manually do unless otherwise instructed.  You should receive your ID card for your new device in 4-8 weeks. Keep this card with you at all times once received. Consider wearing a medical alert bracelet or necklace.  Your Pacemaker may be MRI compatible. This will be discussed at your next office visit/wound check.  You should avoid contact with strong electric or magnetic fields.   Do not use amateur (ham) radio equipment or electric (arc) welding torches. MP3 player headphones with magnets should not be used. Some devices are safe to use if held at least 12 inches  (30 cm) from your Pacemaker. These include power tools, lawn mowers, and speakers. If you are unsure if something is safe to use, ask your health care provider.  When using your cell phone, hold it to the ear that is on the opposite side from the Pacemaker. Do not leave your cell phone in a pocket over the Pacemaker.  You may safely use electric blankets, heating pads, computers, and microwave ovens.  Call the office right away if: You have chest pain. You feel more short of breath than you have felt before. You feel more light-headed than you have felt before. Your incision starts to open up.  This information is not intended to replace advice given to you by your health care provider. Make sure you discuss any questions you have with your health care provider.

## 2024-12-01 ENCOUNTER — Ambulatory Visit

## 2024-12-02 ENCOUNTER — Encounter: Payer: Self-pay | Admitting: Cardiology

## 2024-12-03 NOTE — Progress Notes (Signed)
 Cardiology Office Note    Date:  12/04/2024   ID:  Evan [name], Evan [name] 03-13-1945, MRN [id number]  PCP:  Epifanio Alm SQUIBB, MD  Cardiologist:  Evalene Lunger, MD  Electrophysiologist:  [name], MD   Chief Complaint: Follow up  History of Present Illness:   Evan [name] is a 79 y.o. male with history of nonobstructive CAD by LHC in 04/2024, HFrEF secondary to NICM diagnosed in 12/2023, persistent A-fib status post ablation in 2010, atrial flutter status post cardioversion in 12/2023, DVT/PE in 05/2023, GI bleed, DM2, HTN, HLD, prostate cancer status post radiation, LBBB, prior tobacco use quitting in 1995, and OSA on CPAP who presents for follow-up of his CAD and cardiomyopathy.  He was admitted to [facility] in 12/2023 with A-fib/flutter and new onset HFrEF.  He was diuresed and placed on amiodarone .  He underwent successful cardioversion during admission.  Echo showed an EF of 25 to 30% with normal RV cavity size with mildly reduced RVSF with recommendation for escalation of GDMT.  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  He established care with Dr. Gollan in 01/2024.  Echo in 01/2024 showed an EF of 35 to 40%, global hypokinesis, grade 1 diastolic dysfunction, normal RV systolic function and ventricular cavity size, mild calcification of the aortic valve, and an estimated right atrial pressure of 3 mmHg.  He followed up in our office on 04/14/2024 noting continued shortness of breath that has been present since his hospitalization in 12/2023.  He noted his functional status improved following DCCV, though his shortness of breath persisted.  Weight was stable.  To further evaluate his cardiomyopathy he underwent R/LHC on 05/01/2024 showed mild, nonobstructive CAD with 10 to 20% stenoses of the mid LAD and proximal LCx as well as 20 to 30% stenosis of the proximal RCA.  Normal left and right heart filling pressure, mild pulm hypertension, and normal cardiac output/index.  Follow-up echo  in 07/2024 showed a persistent cardiomyopathy with an EF of 20 to 25%, mild LVH, akinesis of the entire anteroseptal wall, normal RV systolic function and ventricular cavity size, mild mitral regurgitation, and an estimated right atrial pressure of 3 mmHg.   He was admitted to [facility] in 08/2023 with acute on chronic HFrEF with symptomatic improvement with IV diuresis.  BNP 544.  High-sensitivity troponin 20 with a peak and delta of 36.  Chest x-ray with pulmonary edema.  During admission rechallenged with ARB and ultimately discharged on irbesartan  75 mg daily, Jardiance  25 mg daily, BiDil  20/37.5 mg 3 times daily, Toprol -XL 12.5 mg daily, and Lasix  20 mg daily.  He followed up with the [facility] CHF clinic and was significantly orthostatic with reported dizziness with a blood pressure trending 64/40 with positional change.  In this setting we recommended he proceed to the ED where he was asymptomatic and declined further evaluation with recommendation to discontinue with subsequent improvement in dizziness.  Cardiac MRI on 11/04/2024 showed an EF of 37% mid wall LGE in the basal LV segments suggestive of early infiltrative disease versus nonischemic cardiomyopathy with no significant valvular abnormalities.  He subsequently underwent successful BiV ICD on 11/16/2024.  He comes in accompanied by his wife today and is doing very well from a cardiac perspective, without symptoms of angina or cardiac decompensation.  No change in functional status since undergoing BiV ICD.  No lower extremity swelling or progressive orthopnea.  No falls, hematochezia, or melena.  Weight is down 5 pounds today when compared to his  visit in 09/2024.  No dizziness, presyncope, or syncope.  He does not have any acute cardiac concerns at this time.   Labs independently reviewed: 10/2024 - Hgb 14.6, PLT 352, BUN 13, serum creatinine 1.23, potassium 5.0 08/2024 - albumin 4.1, AST/ALT normal, A1c 6.4 07/2024 - magnesium  2.3 03/2024 - TSH  normal 01/2024 - TC 146, TG 45, HDL 65, LDL 72  Past Medical History:  Diagnosis Date   Adenomatous polyp of colon    Arthritis    xDJD   Atrial fibrillation (HCC)    BPH (benign prostatic hyperplasia)    CHF (congestive heart failure) (HCC)    COPD (chronic obstructive pulmonary disease) (HCC)    Diabetes mellitus without complication (HCC)    type 2   Discitis    Diverticulitis    Diverticulosis    DVT (deep venous thrombosis) (HCC)    bilateral tibial   Erectile dysfunction    Hearing loss    Hyperlipidemia    Hypertension    Ischemic optic neuropathy    Lower GI bleeding 02/23/2018   Lumbar degenerative disc disease    Non-ischemic cardiomyopathy (HCC)    Sleep apnea     Past Surgical History:  Procedure Laterality Date   ATRIAL ABLATION SURGERY  2010   BIV PACEMAKER INSERTION CRT-P N/A 11/16/2024   Procedure: BIV PACEMAKER INSERTION CRT-P;  Surgeon: Kennyth Chew, MD;  Location: [facility] INVASIVE CV LAB;  Service: Cardiovascular;  Laterality: N/A;   CATARACT EXTRACTION W/PHACO Right 10/10/2020   Procedure: CATARACT EXTRACTION PHACO AND INTRAOCULAR LENS PLACEMENT (IOC) RIGHT ISTENT INJ DIABETIC 2.64  00:29.4;  Surgeon: Myrna Adine Anes, MD;  Location: [facility];  Service: Ophthalmology;  Laterality: Right;  Diabetic - oral meds   CATARACT EXTRACTION W/PHACO Left 03/06/2021   Procedure: CATARACT EXTRACTION PHACO AND INTRAOCULAR LENS PLACEMENT (IOC) LEFT DIABETIC ISTENT INJ 3.62 00:30.6;  Surgeon: Myrna Adine Anes, MD;  Location: [facility];  Service: Ophthalmology;  Laterality: Left;  Diabetic - oral meds   CHOLECYSTECTOMY  2011   COLONOSCOPY Left 02/24/2018   Procedure: COLONOSCOPY;  Surgeon: Janalyn Keene NOVAK, MD;  Location: [facility] ENDOSCOPY;  Service: Endoscopy;  Laterality: Left;   COLONOSCOPY     2008, 2010, 2013   COLONOSCOPY N/A 04/06/2023   Procedure: COLONOSCOPY;  Surgeon: Toledo, Ladell POUR, MD;  Location: [facility] ENDOSCOPY;  Service:  Gastroenterology;  Laterality: N/A;   COLONOSCOPY WITH PROPOFOL  N/A 09/25/2017   Procedure: COLONOSCOPY WITH PROPOFOL ;  Surgeon: Viktoria Lamar DASEN, MD;  Location: [facility] ENDOSCOPY;  Service: Endoscopy;  Laterality: N/A;   COLONOSCOPY WITH PROPOFOL  N/A 02/18/2018   Procedure: COLONOSCOPY WITH PROPOFOL ;  Surgeon: Jinny Carmine, MD;  Location: [facility] ENDOSCOPY;  Service: Endoscopy;  Laterality: N/A;   ERCP  2011   ESOPHAGOGASTRODUODENOSCOPY Left 02/23/2018   Procedure: ESOPHAGOGASTRODUODENOSCOPY (EGD);  Surgeon: Janalyn Keene NOVAK, MD;  Location: [facility] ENDOSCOPY;  Service: Endoscopy;  Laterality: Left;   ESOPHAGOGASTRODUODENOSCOPY  2014   IVC FILTER INSERTION N/A 04/17/2022   Procedure: IVC FILTER INSERTION;  Surgeon: Jama Cordella MATSU, MD;  Location: [facility] INV[facility] CV [facility];  Service: Cardiovascular;  Laterality: N/A;   IVC FILTER REMOVAL N/A 07/31/2022   Procedure: IVC FILTER REMOVAL;  Surgeon: Jama Cordella MATSU, MD;  Location: [facility];  Service: Cardiovascular;  Laterality: N/A;   LAPAROTOMY  01/01/2017   Procedure: EXPLORATORY LAPAROTOMY;  Surgeon: Aloysius Plant, MD;  Location: [facility] ORS;  Service: General;;   PARTIAL COLECTOMY N/A 01/01/2017   Procedure: PARTIAL COLECTOMY;  Surgeon: Aloysius  Piscoya, MD;  Location: [facility] ORS;  Service: General;  Laterality: N/A;   PERIPHERAL VASCULAR CATHETERIZATION N/A 12/28/2016   Procedure: Visceral Artery Intervention;  Surgeon: Selinda GORMAN Gu, MD;  Location: [facility];  Service: Cardiovascular;  Laterality: N/A;   RIGHT/LEFT HEART CATH AND CORONARY ANGIOGRAPHY Bilateral 05/01/2024   Procedure: RIGHT/LEFT HEART CATH AND CORONARY ANGIOGRAPHY;  Surgeon: Mady Bruckner, MD;  Location: [facility];  Service: Cardiovascular;  Laterality: Bilateral;   SECONDARY CLOSURE OF WOUND N/A 01/08/2017   Procedure: SECONDARY CLOSURE FOR EVISCERATION;  Surgeon: Charlie FORBES Fell, MD;  Location: [facility] ORS;  Service: General;  Laterality: N/A;   SIGMOIDECTOMY   2018   TOTAL HIP ARTHROPLASTY Right 04/24/2022   Procedure: TOTAL HIP ARTHROPLASTY ANTERIOR APPROACH;  Surgeon: Kathlynn Sharper, MD;  Location: [facility] ORS;  Service: Orthopedics;  Laterality: Right;   TOTAL KNEE ARTHROPLASTY Right 03/24/2014   times 4    Current Medications: Current Meds  Medication Sig   acetaminophen  (TYLENOL ) 500 MG tablet Take 1,000 mg by mouth every 8 (eight) hours as needed for moderate pain (pain score 4-6).   albuterol  (VENTOLIN  HFA) 108 (90 Base) MCG/ACT inhaler Inhale 2 puffs into the lungs every 6 (six) hours as needed for shortness of breath or wheezing.   ARNUITY ELLIPTA  100 MCG/ACT AEPB Inhale 1 puff into the lungs daily.   Cholecalciferol  (VITAMIN D3) 2000 units capsule Take 2,000 Units by mouth daily.   hydrOXYzine  (ATARAX /VISTARIL ) 25 MG tablet Take 25 mg by mouth at bedtime as needed (sleep).   JARDIANCE  25 MG TABS tablet Take 25 mg by mouth daily.   metFORMIN  (GLUCOPHAGE -XR) 500 MG 24 hr tablet Take 2 tablets (1,000 mg total) by mouth daily with breakfast. (Patient taking differently: Take 1,000 mg by mouth daily with breakfast. 500 mg am, 500 mg pm)   Multiple Vitamins-Minerals (CENTRUM SILVER PO) Take 1 tablet by mouth every morning.   polyethylene glycol (MIRALAX  / GLYCOLAX ) packet Take 17 g by mouth daily. (Patient taking differently: Take 17 g by mouth daily as needed for mild constipation.)   tamsulosin  (FLOMAX ) 0.4 MG CAPS capsule TAKE 1 CAPSULE BY MOUTH ONCE DAILY AFTERSUPPER   vitamin B-12 (CYANOCOBALAMIN ) 1000 MCG tablet Take 1,000 mcg by mouth daily.   [DISCONTINUED] amiodarone  (PACERONE ) 200 MG tablet Take 1 tablet (200 mg total) by mouth daily.   [DISCONTINUED] apixaban  (ELIQUIS ) 5 MG TABS tablet Take 1 tablet (5 mg total) by mouth 2 (two) times daily.   [DISCONTINUED] furosemide  (LASIX ) 20 MG tablet Take 1 tablet (20 mg total) by mouth daily. Take extra 20 mg with 3 pound weight gain   [DISCONTINUED] irbesartan  (AVAPRO ) 75 MG tablet Take 0.5  tablets (37.5 mg total) by mouth daily.   [DISCONTINUED] metoprolol  succinate (TOPROL -XL) 25 MG 24 hr tablet Take 0.5 tablets (12.5 mg total) by mouth daily.   [DISCONTINUED] pravastatin  (PRAVACHOL ) 40 MG tablet Take 40 mg by mouth at bedtime.    Allergies:   Azithromycin, Penicillins, Lisinopril, Losartan , and Metoprolol    Social History   Socioeconomic History   Marital status: Married    Spouse name: [name]   Number of children: 1   Years of education: Not on file   Highest education level: Not on file  Occupational History   Not on file  Tobacco Use   Smoking status: Former    Current packs/day: 0.00    Average packs/day: 0.3 packs/day for 29.0 years (7.3 ttl pk-yrs)    Types: Cigarettes    Start  date: 12/31/1964    Quit date: 12/31/1993    Years since quitting: 30.9    Passive exposure: Past   Smokeless tobacco: Never  Vaping Use   Vaping status: Never Used  Substance and Sexual Activity   Alcohol  use: No    Alcohol /week: 0.0 standard drinks of alcohol    Drug use: No   Sexual activity: Not on file  Other Topics Concern   Not on file  Social History Narrative   Lives with [place], wife.    Social Drivers of Corporate Investment Banker Strain: Low Risk  (09/08/2024)   Received from [facility]   Overall Financial Resource Strain (CARDIA)    Difficulty of Paying Living Expenses: Not hard at all  Food Insecurity: No Food Insecurity (11/16/2024)   Hunger Vital Sign    Worried About Running Out of Food in the Last Year: Never true    Ran Out of Food in the Last Year: Never true  Transportation Needs: No Transportation Needs (11/16/2024)   PRAPARE - Administrator, Civil Service (Medical): No    Lack of Transportation (Non-Medical): No  Physical Activity: Not on file  Stress: Not on file  Social Connections: Unknown (11/16/2024)   Social Connection and Isolation Panel    Frequency of Communication with Friends and Family: Three times a week     Frequency of Social Gatherings with Friends and Family: Twice a week    Attends Religious Services: 1 to 4 times per year    Active Member of [employer] or Organizations: Yes    Attends Banker Meetings: 1 to 4 times per year    Marital Status: Patient declined     Family History:  The patient's family history includes Diabetes in his brother, brother, sister, and sister; Heart attack in his brother; Ovarian cancer in his sister.  ROS:   12-point review of systems is negative unless otherwise noted in the HPI.   EKGs/Labs/Other Studies Reviewed:    Studies reviewed were summarized above. The additional studies were reviewed today:  Cardiac MRI 11/04/2024: IMPRESSION: 1.  Moderately reduced LV systolic function.  LVEF 37%. 2.  Septal bounce consistent with a bundle branch block. 3. Mid wall LGE in the basal LV segments (midwall and apical sparing? Suggesting early infiltrative disease vs non ischemic cardiomyopathy) 4.  Normal RV systolic function. 5.  No significant valvular abnormalities. __________  Limited echo 08/10/2024: 1. Left ventricular ejection fraction, by estimation, is 20 to 25%. Left  ventricular ejection fraction by 3D volume is 40 %. Left ventricular  ejection fraction by PLAX is 17 %. The left ventricle has severely  decreased function. The left ventricle  demonstrates regional wall motion abnormalities (see scoring  diagram/findings for description). There is mild left ventricular  hypertrophy. There is akinesis of the left ventricular, entire  anteroseptal wall. The average left ventricular global  longitudinal strain is -11.1 %. The global longitudinal strain is  abnormal.   2. Right ventricular systolic function is normal. The right ventricular  size is normal.   3. The mitral valve is normal in structure. Mild mitral valve  regurgitation.   4. The inferior vena cava is normal in size with greater than 50%  respiratory variability,  suggesting right atrial pressure of 3 mmHg.  __________   [facility] 05/01/2024: Conclusions: Mild, non-obstructive coronary artery disease with 10-20% stenoses of the mid LAD and proximal LCx as well as 20-30% stenosis of the proximal RCA. Normal left  and right heart filling pressures (mean RA 3, RV 42/5, PW 10, LVEDP 15 mmHg). Mild pulmonary hypertension (PA 39/10, mean 20 mmHg). Normal Fick cardiac output/index (CO 8.2 L/min, CI 3.3 L/min/m^2).   Recommendations: Continue medical therapy and risk factor modification to prevent progression of mild coronary artery disease. Maintain net even fluid balance. Escalate goal-directed medical therapy for treatment of nonischemic cardiomyopathy, as tolerated. If no evidence of bleeding or vascular injury, apixaban  should be restarted tomorrow morning. __________   2D echo 01/28/2024: 1. Left ventricular ejection fraction, by estimation, is 35 to 40%. The  left ventricle has moderately decreased function. The left ventricle  demonstrates global hypokinesis. The left ventricular internal cavity size  was mildly dilated. Left ventricular  diastolic parameters are consistent with Grade I diastolic dysfunction  (impaired relaxation).   2. Right ventricular systolic function is normal. The right ventricular  size is normal.   3. The mitral valve is normal in structure. No evidence of mitral valve  regurgitation. No evidence of mitral stenosis.   4. The aortic valve is tricuspid. There is mild calcification of the  aortic valve. Aortic valve regurgitation is not visualized. No aortic  stenosis is present.   5. The inferior vena cava is normal in size with greater than 50%  respiratory variability, suggesting right atrial pressure of 3 mmHg.  __________   2D echo 12/06/2023 [facility]): Summary   1. Technically difficult study.    2. The left ventricle is mildly dilated in size with mildly increased wall  thickness.   3. The left ventricular systolic  function is severely decreased, LVEF is  visually estimated at 25-30%.    4. There is mild mitral valve regurgitation.    5. The aortic valve is probably trileaflet with mildly thickened leaflets  with mildly reduced excursion.    6. The right ventricle is normal in size, with mildly reduced systolic  function.   7. IVC size and inspiratory change suggest elevated right atrial pressure.  (10-20 mmHg).  __________   2D echo 05/20/2023: 1. Left ventricular ejection fraction, by estimation, is 50 to 55%. The  left ventricle has low normal function. Left ventricular endocardial  border not optimally defined to evaluate regional wall motion. The left  ventricular internal cavity size was  mildly dilated. There is mild left ventricular hypertrophy. Left  ventricular diastolic parameters are indeterminate.   2. Right ventricular systolic function is normal. The right ventricular  size is normal. Tricuspid regurgitation signal is inadequate for assessing  PA pressure.   3. Left atrial size was mildly dilated.   4. Right atrial size was mildly dilated.   5. The mitral valve is normal in structure. No evidence of mitral valve  regurgitation. No evidence of mitral stenosis.   6. The aortic valve is normal in structure. Aortic valve regurgitation is  not visualized. Aortic valve sclerosis/calcification is present, without  any evidence of aortic stenosis.   7. The inferior vena cava is normal in size with greater than 50%  respiratory variability, suggesting right atrial pressure of 3 mmHg.  __________   2D echo 02/24/2018: - Left ventricle: The cavity size was normal. Systolic function was    normal. The estimated ejection fraction was in the range of 55%    to 60%.  - Aortic valve: There was trivial regurgitation. Valve area (VTI):    2.13 cm^2. Valve area (Vmax): 2.61 cm^2. Valve area (Vmean): 2.22    cm^2.  - Mitral valve: Valve area by  continuity equation (using LVOT    flow): 2.97  cm^2.  __________   2D echo 11/18/2015 [facility]): INTERPRETATION  NORMAL LEFT VENTRICULAR SYSTOLIC FUNCTION  NORMAL RIGHT VENTRICULAR SYSTOLIC FUNCTION  TRIVIAL REGURGITATION NOTED (See above)  NO VALVULAR STENOSIS  Closest EF: >55% (Estimated)  Mitral: TRIVIAL MR  Tricuspid: TRIVIAL TR  LVH: MILD LVH    EKG:  EKG is not ordered today.    Recent Labs: 04/14/2024: ALT 13; TSH 0.762 08/23/2024: B Natriuretic Peptide 544.0 08/26/2024: Magnesium  2.3 11/02/2024: BUN 13; Creatinine, Ser 1.23; Hemoglobin 14.6; Platelets 352; Potassium 5.0; Sodium 141  Recent Lipid Panel    Component Value Date/Time   CHOL 85 08/21/2012 0502   TRIG 74 08/21/2012 0502   HDL 26 (L) 08/21/2012 0502   VLDL 15 08/21/2012 0502   LDLCALC 44 08/21/2012 0502    PHYSICAL EXAM:    VS:  BP 122/76 (BP Location: Left Arm, Patient Position: Sitting, Cuff Size: Large)   Pulse 73   Ht 6' 3.75 (1.924 m)   Wt 254 lb 6.4 oz (115.4 kg)   SpO2 99%   BMI 31.17 kg/m   BMI: Body mass index is 31.17 kg/m.  Physical Exam Vitals reviewed.  Constitutional:      Appearance: He is well-developed.  HENT:     Head: Normocephalic and atraumatic.  Eyes:     General:        Right eye: No discharge.        Left eye: No discharge.  Cardiovascular:     Rate and Rhythm: Normal rate and regular rhythm.     Heart sounds: Normal heart sounds, S1 normal and S2 normal. Heart sounds not distant. No midsystolic click and no opening snap. No murmur heard.    No friction rub.  Pulmonary:     Effort: Pulmonary effort is normal. No respiratory distress.     Breath sounds: Normal breath sounds. No decreased breath sounds, wheezing, rhonchi or rales.  Musculoskeletal:     Cervical back: Normal range of motion.     Right lower leg: No edema.     Left lower leg: No edema.  Skin:    General: Skin is warm and dry.     Nails: There is no clubbing.  Neurological:     Mental Status: He is alert and oriented to person, place, and time.   Psychiatric:        Speech: Speech normal.        Behavior: Behavior normal.        Thought Content: Thought content normal.        Judgment: Judgment normal.     Wt Readings from Last 3 Encounters:  12/04/24 254 lb 6.4 oz (115.4 kg)  10/21/24 266 lb (120.7 kg)  10/20/24 259 lb 3.2 oz (117.6 kg)     ASSESSMENT & PLAN:   HFrEF secondary to NICM with LBBB s/p BiV ICD: Euvolemic and well compensated with NYHA class II symptoms.  Now status post BiV ICD.  Continue current GDMT including irbesartan  37.5 mg, Toprol -XL 12.5 mg and Jardiance , and furosemide  20 mg daily.  Orthostasis with dizziness and prior hyperkalemia have limited escalation/addition of GDMT.  Escalate evidence-based pharmacotherapy as able/tolerated moving forward.  Anticipate follow-up echo in 3 months now that he is status post BiV ICD.  Follow-up with EP as directed.  Nonobstructive CAD: No symptoms suggestive of angina.  Continue aggressive risk factor modification and primary prevention including apixaban  in lieu of aspirin  as well as pravastatin   40 mg.  Persistent Afib/flutter: Remains on Toprol -XL 12.5 mg and amiodarone  200 mg.  Recent LFT and TSH normal.  CHA2DS2-VASc at least 6 (CHF, HTN, age x 2, DM, vascular disease).  Remains on apixaban  5 mg twice daily and does not meet reduced dosing criteria.  Recent CBC stable.  No falls or symptoms concerning for bleeding.  Management per EP.  HTN: Blood pressure is well-controlled in the office today.  Continue pharmacotherapy as above.  HLD: LDL 72 in 01/2024.  Remains on pravastatin .     Disposition: F/u with Dr. Gollan or an APP in 4 months, and EP as directed.    Medication Adjustments/Labs and Tests Ordered: Current medicines are reviewed at length with the patient today.  Concerns regarding medicines are outlined above. Medication changes, Labs and Tests ordered today are summarized above and listed in the Patient Instructions accessible in Encounters.    Signed, Bernardino Bring, PA-C 12/04/2024 8:41 AM     [facility] [place] [address] [place], [place] [address] [phone]

## 2024-12-03 NOTE — Progress Notes (Unsigned)
 Wound check appointment.  Steri-strips removed. Wound without redness or edema. Incision edges approximated, wound well healed. Normal device function. Presents AS- VP. Thresholds, sensing, and impedances consistent with implant measurements. Device programmed at auto capture programmed on for extra safety margin until 3 month visit. Histogram distribution appropriate for patient and level of activity. No mode switches or high ventricular rates noted. VP 97%. Patient educated about wound care, arm mobility, lifting restrictions. ROV in 3 months with implanting physician

## 2024-12-04 ENCOUNTER — Ambulatory Visit: Attending: Physician Assistant | Admitting: Physician Assistant

## 2024-12-04 ENCOUNTER — Ambulatory Visit: Admitting: Cardiology

## 2024-12-04 ENCOUNTER — Encounter: Payer: Self-pay | Admitting: Physician Assistant

## 2024-12-04 VITALS — BP 122/76 | HR 73 | Ht 75.75 in | Wt 254.4 lb

## 2024-12-04 DIAGNOSIS — I10 Essential (primary) hypertension: Secondary | ICD-10-CM

## 2024-12-04 DIAGNOSIS — I2510 Atherosclerotic heart disease of native coronary artery without angina pectoris: Secondary | ICD-10-CM

## 2024-12-04 DIAGNOSIS — Z950 Presence of cardiac pacemaker: Secondary | ICD-10-CM

## 2024-12-04 DIAGNOSIS — I5020 Unspecified systolic (congestive) heart failure: Secondary | ICD-10-CM | POA: Diagnosis not present

## 2024-12-04 DIAGNOSIS — I4819 Other persistent atrial fibrillation: Secondary | ICD-10-CM | POA: Diagnosis not present

## 2024-12-04 DIAGNOSIS — I428 Other cardiomyopathies: Secondary | ICD-10-CM

## 2024-12-04 DIAGNOSIS — E785 Hyperlipidemia, unspecified: Secondary | ICD-10-CM

## 2024-12-04 DIAGNOSIS — I447 Left bundle-branch block, unspecified: Secondary | ICD-10-CM

## 2024-12-04 DIAGNOSIS — Z95810 Presence of automatic (implantable) cardiac defibrillator: Secondary | ICD-10-CM

## 2024-12-04 DIAGNOSIS — I4892 Unspecified atrial flutter: Secondary | ICD-10-CM

## 2024-12-04 LAB — CUP PACEART INCLINIC DEVICE CHECK
Battery Remaining Longevity: 105 mo
Battery Voltage: 2.99 V
Brady Statistic RA Percent Paced: 14 %
Brady Statistic RV Percent Paced: 97 %
Date Time Interrogation Session: 20251205113548
Implantable Lead Connection Status: 753985
Implantable Lead Connection Status: 753985
Implantable Lead Connection Status: 753985
Implantable Lead Implant Date: 20251117
Implantable Lead Implant Date: 20251117
Implantable Lead Implant Date: 20251117
Implantable Lead Location: 753858
Implantable Lead Location: 753859
Implantable Lead Location: 753860
Implantable Pulse Generator Implant Date: 20251117
Lead Channel Impedance Value: 1087.5 Ohm
Lead Channel Impedance Value: 437.5 Ohm
Lead Channel Impedance Value: 487.5 Ohm
Lead Channel Pacing Threshold Amplitude: 0.75 V
Lead Channel Pacing Threshold Amplitude: 0.75 V
Lead Channel Pacing Threshold Amplitude: 0.75 V
Lead Channel Pacing Threshold Amplitude: 0.75 V
Lead Channel Pacing Threshold Amplitude: 2 V
Lead Channel Pacing Threshold Amplitude: 2 V
Lead Channel Pacing Threshold Pulse Width: 0.5 ms
Lead Channel Pacing Threshold Pulse Width: 0.5 ms
Lead Channel Pacing Threshold Pulse Width: 0.5 ms
Lead Channel Pacing Threshold Pulse Width: 0.5 ms
Lead Channel Pacing Threshold Pulse Width: 0.5 ms
Lead Channel Pacing Threshold Pulse Width: 0.5 ms
Lead Channel Sensing Intrinsic Amplitude: 1.9 mV
Lead Channel Sensing Intrinsic Amplitude: 12 mV
Lead Channel Setting Pacing Amplitude: 1.5 V
Lead Channel Setting Pacing Amplitude: 2 V
Lead Channel Setting Pacing Amplitude: 3.125
Lead Channel Setting Pacing Pulse Width: 0.5 ms
Lead Channel Setting Pacing Pulse Width: 0.5 ms
Lead Channel Setting Sensing Sensitivity: 2 mV
Pulse Gen Model: 3562
Pulse Gen Serial Number: 8301079

## 2024-12-04 MED ORDER — PRAVASTATIN SODIUM 40 MG PO TABS: 40.0000 mg | ORAL_TABLET | Freq: Every day | ORAL | 11 refills | Status: AC

## 2024-12-04 MED ORDER — APIXABAN 5 MG PO TABS: 5.0000 mg | ORAL_TABLET | Freq: Two times a day (BID) | ORAL | 11 refills | Status: AC

## 2024-12-04 MED ORDER — IRBESARTAN 75 MG PO TABS: 37.5000 mg | ORAL_TABLET | Freq: Every day | ORAL | 3 refills | Status: AC

## 2024-12-04 MED ORDER — FUROSEMIDE 20 MG PO TABS: 20.0000 mg | ORAL_TABLET | Freq: Every day | ORAL | 11 refills | Status: DC

## 2024-12-04 MED ORDER — AMIODARONE HCL 200 MG PO TABS: 200.0000 mg | ORAL_TABLET | Freq: Every day | ORAL | 3 refills | Status: AC

## 2024-12-04 MED ORDER — METOPROLOL SUCCINATE ER 25 MG PO TB24: 12.5000 mg | ORAL_TABLET | Freq: Every day | ORAL | 3 refills | Status: AC

## 2024-12-04 NOTE — Patient Instructions (Signed)
 Medication Instructions:  Your physician recommends that you continue on your current medications as directed. Please refer to the Current Medication list given to you today.   *If you need a refill on your cardiac medications before your next appointment, please call your pharmacy*  Lab Work: None ordered at this time   Testing/Procedures: Your physician has requested that you have an echocardiogram in 3 months. Echocardiography is a painless test that uses sound waves to create images of your heart. It provides your doctor with information about the size and shape of your heart and how well your heart's chambers and valves are working.   You may receive an ultrasound enhancing agent through an IV if needed to better visualize your heart during the echo. This procedure takes approximately one hour.  There are no restrictions for this procedure.  This will take place at [address] [facility] [address] [facility], [place] [address]  Please note: We ask at that you not bring children with you during ultrasound (echo/ vascular) testing. Due to room size and safety concerns, children are not allowed in the ultrasound rooms during exams. Our front office staff cannot provide observation of children in our lobby area while testing is being conducted. An adult accompanying a patient to their appointment will only be allowed in the ultrasound room at the discretion of the ultrasound technician under special circumstances. We apologize for any inconvenience.  Follow-Up: At [facility], you and your health needs are our priority.  As part of our continuing mission to provide you with exceptional heart care, our providers are all part of one team.  This team includes your primary Cardiologist (physician) and Advanced Practice Providers or APPs (Physician Assistants and Nurse Practitioners) who all work together to provide you with the care you need, when you need it.  Your next appointment:    4 month(s)  Provider:   You may see Timothy Gollan, MD or Bernardino Bring, PA-C   We recommend signing up for the patient portal called MyChart.  Sign up information is provided on this After Visit Summary.  MyChart is used to connect with patients for Virtual Visits (Telemedicine).  Patients are able to view lab/test results, encounter notes, upcoming appointments, etc.  Non-urgent messages can be sent to your provider as well.   To learn more about what you can do with MyChart, go to [email or web address].

## 2024-12-04 NOTE — Patient Instructions (Signed)
OK to shower Let warm soapy water run down incision Do not scrub incision Pat dry with towel  Keep incision open to air - no ointments, creams, salves, or bandages.  Call device clinic if incision opens, has drainage, or begins to hurt more.

## 2024-12-28 ENCOUNTER — Ambulatory Visit: Attending: Infectious Diseases

## 2024-12-28 DIAGNOSIS — I5022 Chronic systolic (congestive) heart failure: Secondary | ICD-10-CM

## 2024-12-30 LAB — CUP PACEART REMOTE DEVICE CHECK
Battery Remaining Longevity: 102 mo
Battery Remaining Percentage: 95.5 %
Battery Voltage: 3.01 V
Brady Statistic AP VP Percent: 12 %
Brady Statistic AP VS Percent: 1 %
Brady Statistic AS VP Percent: 85 %
Brady Statistic AS VS Percent: 1.5 %
Brady Statistic RA Percent Paced: 10 %
Date Time Interrogation Session: 20251230140631
Implantable Lead Connection Status: 753985
Implantable Lead Connection Status: 753985
Implantable Lead Connection Status: 753985
Implantable Lead Implant Date: 20251117
Implantable Lead Implant Date: 20251117
Implantable Lead Implant Date: 20251117
Implantable Lead Location: 753858
Implantable Lead Location: 753859
Implantable Lead Location: 753860
Implantable Pulse Generator Implant Date: 20251117
Lead Channel Impedance Value: 490 Ohm
Lead Channel Impedance Value: 550 Ohm
Lead Channel Impedance Value: 940 Ohm
Lead Channel Pacing Threshold Amplitude: 0.75 V
Lead Channel Pacing Threshold Amplitude: 0.875 V
Lead Channel Pacing Threshold Amplitude: 1.375 V
Lead Channel Pacing Threshold Pulse Width: 0.5 ms
Lead Channel Pacing Threshold Pulse Width: 0.5 ms
Lead Channel Pacing Threshold Pulse Width: 0.5 ms
Lead Channel Sensing Intrinsic Amplitude: 1.9 mV
Lead Channel Sensing Intrinsic Amplitude: 12 mV
Lead Channel Setting Pacing Amplitude: 1.875
Lead Channel Setting Pacing Amplitude: 2 V
Lead Channel Setting Pacing Amplitude: 2.375
Lead Channel Setting Pacing Pulse Width: 0.5 ms
Lead Channel Setting Pacing Pulse Width: 0.5 ms
Lead Channel Setting Sensing Sensitivity: 2 mV
Pulse Gen Model: 3562
Pulse Gen Serial Number: 8301079

## 2025-01-05 NOTE — Progress Notes (Signed)
 Remote PPM Transmission

## 2025-01-21 ENCOUNTER — Telehealth: Payer: Self-pay | Admitting: Family

## 2025-01-21 NOTE — Progress Notes (Unsigned)
 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  Admitted to [facility] in 12/2023 with A-fib/flutter and new onset HFrEF. He was diuresed and placed on amiodarone . He underwent successful cardioversion during admission. Echo showed an EF of 25 to 30% with normal RV cavity size with mildly reduced RVSF with recommendation for escalation of GDMT. 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   Echo 01/2024 showed an EF of 35 to 40%, global hypokinesis, grade 1 diastolic dysfunction, normal RV systolic function and ventricular cavity size, mild calcification of the aortic valve, and an estimated right atrial pressure of 3 mmHg.   Seen in the ED in 02/2024 after sustaining a mechanical fall and was prescribed tramadol  for back pain. Seen in the ER in 02/2024 dizziness, lightheadedness, back pain, and associated shortness of breath. High-sensitivity troponin negative x 2. ProBNP 1303.  Chest x-ray with mildly prominent interstitial markings.   [facility] 05/01/2024 showed mild, nonobstructive CAD with 10 to 20% stenoses of the mid LAD and proximal LCx as well as 20 to 30% stenosis of the proximal RCA. Normal left and right heart filling pressure, mild pulm hypertension, and normal cardiac output/index.   Echo 08/10/2024 showed EF 20 to 25%   Admitted 08/23/24 with shortness of breath and chest tightness with 5 pound weight gain. BNP 544. IV diuresed with resultant loss of 5L. Spironolactone held due to hyperkalemia. Irbesartan / bidil  started.   Was in the ED  08/28/24 due to hypotension/ dizziness. Meds adjusted at clinic visit earlier in day (bidil  stopped/ irbesartan  decreased) and his symptoms resolved after getting to the ER.   cMRI 11/04/24: EF 37% mid wall LGE in the basal LV segments suggestive of early infiltrative disease versus nonischemic cardiomyopathy with no significant valvular abnormalities   Underwent successful BiV ICD on 11/16/2024.   Saw [facility] cardiology 12/25 with no med changes.   He presents today for a HF follow-up visit with a chief complaint of moderate fatigue. Has associated intermittent dizziness, occasional right ankle pain. Interrupted sleep. Reports having a good appetite. Has resumed going to the gym on the stationary bike. Denies any shortness of breath, chest pain, palpitations, pedal edema. Reports a good appetite. Doesn't notice any difference in his health since his device was put in.   Initially said he was taking his furosemide  as PRN but then later said he was taking it as 20mg  BID every day.    ROS: All systems negative except what is listed in HPI, PMH and Problem List   Past Medical History:  Diagnosis Date   Adenomatous polyp of colon    Arthritis    xDJD   Atrial fibrillation (HCC)    BPH (benign prostatic hyperplasia)    CHF (congestive heart failure) (HCC)    COPD (chronic obstructive pulmonary disease) (HCC)    Diabetes mellitus without complication (HCC)    type 2   Discitis    Diverticulitis    Diverticulosis    DVT (deep venous thrombosis) (HCC)    bilateral tibial   Erectile dysfunction  Hearing loss    Hyperlipidemia    Hypertension    Ischemic optic neuropathy    Lower GI bleeding 02/23/2018   Lumbar degenerative disc disease    Non-ischemic cardiomyopathy (HCC)    Sleep apnea     Current Outpatient Medications  Medication Sig Dispense Refill   acetaminophen  (TYLENOL ) 500 MG tablet Take 1,000 mg by mouth every 8 (eight) hours as needed for moderate pain (pain score 4-6).      albuterol  (VENTOLIN  HFA) 108 (90 Base) MCG/ACT inhaler Inhale 2 puffs into the lungs every 6 (six) hours as needed for shortness of breath or wheezing.     amiodarone  (PACERONE ) 200 MG tablet Take 1 tablet (200 mg total) by mouth daily. 90 tablet 3   apixaban  (ELIQUIS ) 5 MG TABS tablet Take 1 tablet (5 mg total) by mouth 2 (two) times daily. 60 tablet 11   ARNUITY ELLIPTA  100 MCG/ACT AEPB Inhale 1 puff into the lungs daily.     Cholecalciferol  (VITAMIN D3) 2000 units capsule Take 2,000 Units by mouth daily.     furosemide  (LASIX ) 20 MG tablet Take 1 tablet (20 mg total) by mouth daily. Take extra 20 mg with 3 pound weight gain 30 tablet 11   hydrOXYzine  (ATARAX /VISTARIL ) 25 MG tablet Take 25 mg by mouth at bedtime as needed (sleep).     irbesartan  (AVAPRO ) 75 MG tablet Take 0.5 tablets (37.5 mg total) by mouth daily. 45 tablet 3   JARDIANCE  25 MG TABS tablet Take 25 mg by mouth daily.     metFORMIN  (GLUCOPHAGE -XR) 500 MG 24 hr tablet Take 2 tablets (1,000 mg total) by mouth daily with breakfast. (Patient taking differently: Take 1,000 mg by mouth daily with breakfast. 500 mg am, 500 mg pm)     metoprolol  succinate (TOPROL -XL) 25 MG 24 hr tablet Take 0.5 tablets (12.5 mg total) by mouth daily. 45 tablet 3   Multiple Vitamins-Minerals (CENTRUM SILVER PO) Take 1 tablet by mouth every morning.     polyethylene glycol (MIRALAX  / GLYCOLAX ) packet Take 17 g by mouth daily. (Patient taking differently: Take 17 g by mouth daily as needed for mild constipation.) 14 each 0   pravastatin  (PRAVACHOL ) 40 MG tablet Take 1 tablet (40 mg total) by mouth at bedtime. 30 tablet 11   tamsulosin  (FLOMAX ) 0.4 MG CAPS capsule TAKE 1 CAPSULE BY MOUTH ONCE DAILY AFTERSUPPER 90 capsule 4   vitamin B-12 (CYANOCOBALAMIN ) 1000 MCG tablet Take 1,000 mcg by mouth daily.     No current facility-administered medications for this visit.    Allergies  Allergen Reactions   Azithromycin Rash   Penicillins Rash   Lisinopril  Cough   Losartan  Cough   Metoprolol  Other (See Comments) and Rash    Bradycardia.      Social History   Socioeconomic History   Marital status: Married    Spouse name: [name]   Number of children: 1   Years of education: Not on file   Highest education level: Not on file  Occupational History   Not on file  Tobacco Use   Smoking status: Former    Current packs/day: 0.00    Average packs/day: 0.3 packs/day for 29.0 years (7.3 ttl pk-yrs)    Types: Cigarettes    Start date: 12/31/1964    Quit date: 12/31/1993    Years since quitting: 31.0    Passive exposure: Past   Smokeless tobacco: Never  Vaping Use   Vaping status: Never Used  Substance and Sexual Activity  Alcohol  use: No    Alcohol /week: 0.0 standard drinks of alcohol    Drug use: No   Sexual activity: Not on file  Other Topics Concern   Not on file  Social History Narrative   Lives with [place], wife.    Social Drivers of Health   Tobacco Use: Medium Risk (12/04/2024)   Patient History    Smoking Tobacco Use: Former    Smokeless Tobacco Use: Never    Passive Exposure: Past  Physicist, Medical Strain: Low Risk  (09/08/2024)   Received from [facility]   Overall Financial Resource Strain (CARDIA)    Difficulty of Paying Living Expenses: Not hard at all  Food Insecurity: No Food Insecurity (11/16/2024)   Epic    Worried About Programme Researcher, Broadcasting/film/video in the Last Year: Never true    Ran Out of Food in the Last Year: Never true  Transportation Needs: No Transportation Needs (11/16/2024)   Epic    Lack of Transportation (Medical): No    Lack of Transportation (Non-Medical): No  Physical Activity: Not on file  Stress: Not on file  Social Connections: Unknown (11/16/2024)   Social Connection and Isolation Panel    Frequency of Communication with Friends and Family: Three times a week    Frequency of Social Gatherings with Friends and Family: Twice a week    Attends Religious Services: 1 to 4 times per  year    Active Member of [employer] or Organizations: Yes    Attends Banker Meetings: 1 to 4 times per year    Marital Status: Patient declined  Intimate Partner Violence: Unknown (11/16/2024)   Epic    Fear of Current or Ex-Partner: No    Emotionally Abused: No    Physically Abused: Not on file    Sexually Abused: No  Depression (PHQ2-9): Low Risk (08/10/2024)   Depression (PHQ2-9)    PHQ-2 Score: 0  Alcohol  Screen: Not on file  Housing: Low Risk (11/16/2024)   Epic    Unable to Pay for Housing in the Last Year: No    Number of Times Moved in the Last Year: 0    Homeless in the Last Year: No  Utilities: Not At Risk (11/16/2024)   Epic    Threatened with loss of utilities: No  Health Literacy: Not on file      Family History  Problem Relation Age of Onset   Diabetes Sister    Ovarian cancer Sister    Diabetes Sister    Diabetes Brother    Heart attack Brother    Diabetes Brother    Vitals:   01/22/25 0844  BP: 131/77  Pulse: 75  SpO2: 97%  Weight: 264 lb 3.2 oz (119.8 kg)   Wt Readings from Last 3 Encounters:  01/22/25 264 lb 3.2 oz (119.8 kg)  12/04/24 254 lb 6.4 oz (115.4 kg)  10/21/24 266 lb (120.7 kg)   Lab Results  Component Value Date   CREATININE 1.23 11/02/2024   CREATININE 1.30 (H) 08/26/2024   CREATININE 1.22 08/25/2024    PHYSICAL EXAM:  General: Well appearing. HOH Cor: No JVD. Regular rhythm, rate.  Lungs: clear Abdomen: soft, nontender, nondistended. Extremities: trace pitting edema around bilateral ankles Neuro:. Affect pleasant   ECG 11/18/24: AS-VP  Abbott device interrogated today and shows a declining thoracic impedance   ASSESSMENT & PLAN:  1: NICM with reduced EF- - suspect due to AF / LBBB as [facility] 05/25 showed nonobstructive CAD - NYHA class  II - euvolemic - weight stable from last visit here 3 months ago - Echo 12/24: EF of 25 to 30% with normal RV cavity size with mildly reduced RVSF - Echo 01/2024: EF of 35  to 40%, global hypokinesis, grade 1 diastolic dysfunction, normal RV systolic function and ventricular cavity size, mild calcification of the aortic valve, and an estimated right atrial pressure of 3 mmHg.   - Echo 08/10/2024 showed EF 20 to 25%  - cMRI 11/04/24: EF 37% mid wall LGE in the basal LV segments suggestive of early infiltrative disease versus nonischemic cardiomyopathy with no significant valvular abnormalities  - has upcoming echo scheduled on 03/04/25 - continue furosemide  20mg  daily but will add additional 20mg  daily X 2 days only due to thoracic impedance declining - continue irbesartan  37.5mg  daily  - continue jardiance  25mg  daily (DM dose) - continue metoprolol  succinate 12.5mg  daily - may not be a good candidate for MRA as he's been hyperkalemic in the past - bidil  previously stopped due to hypotension - BNP 08/23/24 was 544.0  2: Nonobstructive CAD- - [facility] 05/01/2024 showed mild, nonobstructive CAD with 10 to 20% stenoses of the mid LAD and proximal LCx as well as 20 to 30% stenosis of the proximal RCA. Normal left and right heart filling pressure, mild pulm hypertension, and normal cardiac output/index.  - HS-trop 08/25 ranged 20=>33 - saw cardiology (Dunn) 12/25  3: HTN- - BP 131/77 - saw PCP Dois) 09/25 - BMP 11/02/24 reviewed: sodium 141, potassium 5.0, creatinine 1.23, GFR 59  4: AF- - ablation 2010 - DCCV 12/24 - saw EP Anice) 10/25.  - Underwent successful BiV ICD on 11/16/2024.  - continue amiodarone  200mg  daily - continue apixaban  5mg  BID - TSH 04/14/24 was 0.762 - will need regular eye exams  5: COPD- - saw pulmonology Alica) 06/25  6: DM- - A1c 09/01/24 was 6.4% - managed by PCP  7: Hyperlipidemia- - LDL 01/27/24 was 72 - continue pravastatin  40mg  daily   Return in 6 months, sooner if needed.   I spent 48 minutes reviewing records, interviewing/ examing patient and managing plan/ orders.   Ellouise DELENA Class, FNP 01/21/25  "

## 2025-01-21 NOTE — Telephone Encounter (Signed)
 Called to confirm/remind patient of their appointment at the Advanced Heart Failure Clinic on 01/21/25.   Appointment:   [] Confirmed  [x] Left mess   [] No answer/No voice mail  [] VM Full/unable to leave message  [] Phone not in service  Patient reminded to bring all medications and/or complete list.  Confirmed patient has transportation. Gave directions, instructed to utilize valet parking.

## 2025-01-22 ENCOUNTER — Encounter: Payer: Self-pay | Admitting: Family

## 2025-01-22 ENCOUNTER — Ambulatory Visit: Attending: Family | Admitting: Family

## 2025-01-22 VITALS — BP 131/77 | HR 75 | Wt 264.2 lb

## 2025-01-22 DIAGNOSIS — R5383 Other fatigue: Secondary | ICD-10-CM | POA: Insufficient documentation

## 2025-01-22 DIAGNOSIS — I110 Hypertensive heart disease with heart failure: Secondary | ICD-10-CM | POA: Insufficient documentation

## 2025-01-22 DIAGNOSIS — E119 Type 2 diabetes mellitus without complications: Secondary | ICD-10-CM | POA: Insufficient documentation

## 2025-01-22 DIAGNOSIS — I2510 Atherosclerotic heart disease of native coronary artery without angina pectoris: Secondary | ICD-10-CM | POA: Insufficient documentation

## 2025-01-22 DIAGNOSIS — E785 Hyperlipidemia, unspecified: Secondary | ICD-10-CM | POA: Diagnosis not present

## 2025-01-22 DIAGNOSIS — J449 Chronic obstructive pulmonary disease, unspecified: Secondary | ICD-10-CM | POA: Diagnosis not present

## 2025-01-22 DIAGNOSIS — I5022 Chronic systolic (congestive) heart failure: Secondary | ICD-10-CM | POA: Insufficient documentation

## 2025-01-22 DIAGNOSIS — N1831 Chronic kidney disease, stage 3a: Secondary | ICD-10-CM

## 2025-01-22 DIAGNOSIS — Z95810 Presence of automatic (implantable) cardiac defibrillator: Secondary | ICD-10-CM | POA: Insufficient documentation

## 2025-01-22 DIAGNOSIS — Z7984 Long term (current) use of oral hypoglycemic drugs: Secondary | ICD-10-CM | POA: Diagnosis not present

## 2025-01-22 DIAGNOSIS — E875 Hyperkalemia: Secondary | ICD-10-CM | POA: Insufficient documentation

## 2025-01-22 DIAGNOSIS — Z79899 Other long term (current) drug therapy: Secondary | ICD-10-CM | POA: Diagnosis not present

## 2025-01-22 DIAGNOSIS — Z7901 Long term (current) use of anticoagulants: Secondary | ICD-10-CM | POA: Insufficient documentation

## 2025-01-22 DIAGNOSIS — I10 Essential (primary) hypertension: Secondary | ICD-10-CM

## 2025-01-22 DIAGNOSIS — E1122 Type 2 diabetes mellitus with diabetic chronic kidney disease: Secondary | ICD-10-CM

## 2025-01-22 DIAGNOSIS — I428 Other cardiomyopathies: Secondary | ICD-10-CM | POA: Diagnosis not present

## 2025-01-22 DIAGNOSIS — I4891 Unspecified atrial fibrillation: Secondary | ICD-10-CM | POA: Insufficient documentation

## 2025-01-22 DIAGNOSIS — E782 Mixed hyperlipidemia: Secondary | ICD-10-CM | POA: Diagnosis not present

## 2025-01-22 DIAGNOSIS — Z87891 Personal history of nicotine dependence: Secondary | ICD-10-CM | POA: Diagnosis not present

## 2025-01-22 DIAGNOSIS — I4819 Other persistent atrial fibrillation: Secondary | ICD-10-CM

## 2025-01-22 MED ORDER — FUROSEMIDE 20 MG PO TABS: 20.0000 mg | ORAL_TABLET | Freq: Two times a day (BID) | ORAL | 11 refills | Status: AC

## 2025-01-22 NOTE — Patient Instructions (Signed)
 Medication Changes:  Please take an extra 20mg  (1 tab) of furosemide  (lasix ) for two days   Follow-Up in: Please follow up with the Advanced Heart Failure Clinic in 6 months with Ellouise Class, FNP.   Thank you for choosing [facility].    At the Advanced Heart Failure Clinic, you and your health needs are our priority. We have a designated team specialized in the treatment of Heart Failure. This Care Team includes your primary Heart Failure Specialized Cardiologist (physician), Advanced Practice Providers (APPs- Physician Assistants and Nurse Practitioners), and Pharmacist who all work together to provide you with the care you need, when you need it.   You may see any of the following providers on your designated Care Team at your next follow up:  Dr. Toribio Fuel Dr. Ezra Shuck Dr. Ria Commander Dr. Morene Brownie Ellouise Class, FNP Jaun Bash, RPH-CPP  Please be sure to bring in all your medications bottles to every appointment.   Need to Contact Us :  If you have any questions or concerns before your next appointment please send us  a message through [place] or call our office at [phone].    TO LEAVE A MESSAGE FOR THE NURSE SELECT OPTION 2, PLEASE LEAVE A MESSAGE INCLUDING: YOUR NAME DATE OF BIRTH CALL BACK NUMBER REASON FOR CALL**this is important as we prioritize the call backs  YOU WILL RECEIVE A CALL BACK THE SAME DAY AS LONG AS YOU CALL BEFORE 4:00 PM

## 2025-02-03 ENCOUNTER — Other Ambulatory Visit: Payer: Self-pay | Admitting: *Deleted

## 2025-02-03 DIAGNOSIS — C61 Malignant neoplasm of prostate: Secondary | ICD-10-CM

## 2025-02-08 ENCOUNTER — Inpatient Hospital Stay

## 2025-02-15 ENCOUNTER — Ambulatory Visit: Admitting: Radiation Oncology

## 2025-02-16 ENCOUNTER — Ambulatory Visit: Admitting: Cardiology

## 2025-03-04 ENCOUNTER — Ambulatory Visit

## 2025-03-23 ENCOUNTER — Ambulatory Visit: Admitting: Cardiology

## 2025-03-29 ENCOUNTER — Ambulatory Visit

## 2025-04-06 ENCOUNTER — Ambulatory Visit: Admitting: Physician Assistant

## 2025-06-28 ENCOUNTER — Ambulatory Visit

## 2025-07-23 ENCOUNTER — Ambulatory Visit: Admitting: Family

## 2025-09-27 ENCOUNTER — Ambulatory Visit

## 2025-12-27 ENCOUNTER — Ambulatory Visit
